# Patient Record
Sex: Female | Born: 1945 | Race: White | Hispanic: No | Marital: Married | Smoking: Never smoker
Health system: Southern US, Community
[De-identification: ages and names within clinical notes are randomized; demographics above are authoritative.]

## PROBLEM LIST (undated history)

## (undated) DIAGNOSIS — K219 Gastro-esophageal reflux disease without esophagitis: Secondary | ICD-10-CM

## (undated) DIAGNOSIS — N189 Chronic kidney disease, unspecified: Secondary | ICD-10-CM

## (undated) DIAGNOSIS — Z87442 Personal history of urinary calculi: Secondary | ICD-10-CM

## (undated) DIAGNOSIS — E079 Disorder of thyroid, unspecified: Secondary | ICD-10-CM

## (undated) DIAGNOSIS — E785 Hyperlipidemia, unspecified: Secondary | ICD-10-CM

## (undated) DIAGNOSIS — E119 Type 2 diabetes mellitus without complications: Secondary | ICD-10-CM

## (undated) DIAGNOSIS — D649 Anemia, unspecified: Secondary | ICD-10-CM

## (undated) DIAGNOSIS — R7303 Prediabetes: Secondary | ICD-10-CM

## (undated) DIAGNOSIS — E039 Hypothyroidism, unspecified: Secondary | ICD-10-CM

## (undated) DIAGNOSIS — J189 Pneumonia, unspecified organism: Secondary | ICD-10-CM

## (undated) DIAGNOSIS — C801 Malignant (primary) neoplasm, unspecified: Secondary | ICD-10-CM

## (undated) DIAGNOSIS — I499 Cardiac arrhythmia, unspecified: Secondary | ICD-10-CM

## (undated) DIAGNOSIS — R0600 Dyspnea, unspecified: Secondary | ICD-10-CM

## (undated) DIAGNOSIS — G43909 Migraine, unspecified, not intractable, without status migrainosus: Secondary | ICD-10-CM

## (undated) DIAGNOSIS — R0989 Other specified symptoms and signs involving the circulatory and respiratory systems: Secondary | ICD-10-CM

## (undated) DIAGNOSIS — D696 Thrombocytopenia, unspecified: Secondary | ICD-10-CM

## (undated) DIAGNOSIS — I10 Essential (primary) hypertension: Secondary | ICD-10-CM

## (undated) DIAGNOSIS — M1990 Unspecified osteoarthritis, unspecified site: Secondary | ICD-10-CM

## (undated) HISTORY — PX: OTHER SURGICAL HISTORY: SHX169

## (undated) HISTORY — DX: Prediabetes: R73.03

## (undated) HISTORY — DX: Other specified symptoms and signs involving the circulatory and respiratory systems: R09.89

## (undated) HISTORY — DX: Disorder of thyroid, unspecified: E07.9

## (undated) HISTORY — DX: Gastro-esophageal reflux disease without esophagitis: K21.9

## (undated) HISTORY — DX: Hyperlipidemia, unspecified: E78.5

## (undated) HISTORY — DX: Type 2 diabetes mellitus without complications: E11.9

## (undated) HISTORY — DX: Essential (primary) hypertension: I10

## (undated) HISTORY — PX: VENTRAL HERNIA REPAIR: SHX424

## (undated) HISTORY — PX: FOOT SURGERY: SHX648

## (undated) HISTORY — PX: TONSILLECTOMY: SUR1361

## (undated) HISTORY — DX: Unspecified osteoarthritis, unspecified site: M19.90

## (undated) HISTORY — PX: HERNIA REPAIR: SHX51

## (undated) HISTORY — PX: UPPER GI ENDOSCOPY: SHX6162

## (undated) HISTORY — DX: Migraine, unspecified, not intractable, without status migrainosus: G43.909

---

## 1970-04-17 HISTORY — PX: PILONIDAL CYST EXCISION: SHX744

## 1983-04-18 HISTORY — PX: HERNIA REPAIR: SHX51

## 1994-04-17 HISTORY — PX: CHOLECYSTECTOMY: SHX55

## 1999-04-21 ENCOUNTER — Encounter: Admission: RE | Admit: 1999-04-21 | Discharge: 1999-04-21 | Payer: Self-pay | Admitting: Family Medicine

## 1999-04-21 ENCOUNTER — Encounter: Payer: Self-pay | Admitting: Family Medicine

## 2000-05-11 ENCOUNTER — Encounter: Payer: Self-pay | Admitting: Family Medicine

## 2000-05-11 ENCOUNTER — Encounter: Admission: RE | Admit: 2000-05-11 | Discharge: 2000-05-11 | Payer: Self-pay | Admitting: Family Medicine

## 2000-05-29 ENCOUNTER — Encounter (INDEPENDENT_AMBULATORY_CARE_PROVIDER_SITE_OTHER): Payer: Self-pay | Admitting: Specialist

## 2000-05-29 ENCOUNTER — Other Ambulatory Visit: Admission: RE | Admit: 2000-05-29 | Discharge: 2000-05-29 | Payer: Self-pay | Admitting: Obstetrics and Gynecology

## 2003-09-08 ENCOUNTER — Ambulatory Visit (HOSPITAL_COMMUNITY): Admission: RE | Admit: 2003-09-08 | Discharge: 2003-09-08 | Payer: Self-pay | Admitting: *Deleted

## 2004-06-16 ENCOUNTER — Ambulatory Visit: Payer: Self-pay | Admitting: Internal Medicine

## 2004-06-22 ENCOUNTER — Ambulatory Visit: Payer: Self-pay

## 2007-04-18 HISTORY — PX: COLONOSCOPY: SHX174

## 2007-12-04 ENCOUNTER — Ambulatory Visit (HOSPITAL_COMMUNITY): Admission: RE | Admit: 2007-12-04 | Discharge: 2007-12-04 | Payer: Self-pay | Admitting: General Surgery

## 2008-06-30 ENCOUNTER — Encounter: Admission: RE | Admit: 2008-06-30 | Discharge: 2008-06-30 | Payer: Self-pay | Admitting: Family Medicine

## 2009-07-27 ENCOUNTER — Emergency Department (HOSPITAL_COMMUNITY): Admission: EM | Admit: 2009-07-27 | Discharge: 2009-07-27 | Payer: Self-pay | Admitting: Family Medicine

## 2010-08-30 NOTE — Op Note (Signed)
NAME:  Jillian Rodgers, Jillian Rodgers             ACCOUNT NO.:  [id number]   MEDICAL RECORD NO.:  [id number]          PATIENT TYPE:  AMB   LOCATION:  SDS                          FACILITY:  [facility]   PHYSICIAN:  Leonie Man, M.D.   DATE OF BIRTH:  03-02-1946   DATE OF PROCEDURE:  12/04/2007  DATE OF DISCHARGE:                               OPERATIVE REPORT   PREOPERATIVE DIAGNOSIS:  Recurrent ventral hernia.   POSTOPERATIVE DIAGNOSIS:  Recurrent ventral hernia.   PROCEDURE:  Repair of recurrent ventral hernia.   SURGEON:  Leonie Man, MD   ASSISTANT:  OR R.N.   ANESTHESIA:  General.   FINDINGS:  Recurrent hernia with incarcerated preperitoneal fat with  underlying bulging mesh repair.   SPECIMENS:  There were no specimens sent to the lab.   ESTIMATED BLOOD LOSS:  Minimal.   COMPLICATIONS:  There were no complications.  The patient returned to  the PACU in excellent condition.   HISTORY AND INDICATIONS:  The patient is a 65 year old lady presenting  with epigastric abdominal pain.  She had undergone an open  cholecystectomy in the remote past and developed a hernia in the medial  aspect of this incision.  This was repaired laparoscopically.  She  subsequently developed a mass in the medial aspect of her  cholecystectomy incision again and this has been causing some pain and  discomfort, and this was diagnosed as a recurrent hernia.  The patient  comes to the operating room now after the details of the procedure, the  risks, and benefits of surgery have been fully discussed.  All questions  have been answered and the consent for operation obtained.   PROCEDURE:  Following the induction of satisfactory general anesthesia,  the abdomen was prepped and draped to be included in a sterile operative  field.  We identified the patient as Jillian Rodgers and the operation  to be done as ventral hernia repair, routine identification precautions  and presurgical precautions were carried  out.   An incision through the old right upper quadrant incision was extended  slightly into the epigastrium and was deepened through the skin and  subcutaneous tissues with the dissection carried down to the region of  the hernia, but was overlying incarcerated preperitoneal fat was  dissected free.  The fat was removed and discarded.  The underlying mesh  bulges contents up into the hernia significantly.  I did not find any  defects around the mesh, I therefore decided not to put a new mesh in,  but to simply close a defect over the already present mesh, including  the mesh into the sutures.  This was accomplished with #1 interrupted  Novofil sutures.  At the end of this, the repair appeared to be fully  intact.  Sponge and instrument counts were verified.  The incision was  irrigated with normal saline and the subcutaneous tissues closed with  interrupted 3-0 Vicryl sutures.  Skin was closed with running  intradermal 4-0 Monocryl suture and then reinforced with Dermabond and  Steri-Strips, and a sterile dressing was applied.  Anesthetic reversed.  The patient was removed from  the operating room to the recovery room in  stable condition.  She tolerated the procedure well.      Leonie Man, M.D.  Electronically Signed     PB/MEDQ  D:  12/04/2007  T:  12/04/2007  Job:  [id number]

## 2010-09-02 NOTE — Op Note (Signed)
NAME:  Jillian Rodgers, Jillian Rodgers              ACCOUNT NO.:  [id number]   MEDICAL RECORD NO.:  [id number]                   PATIENT TYPE:  AMB   LOCATION:  DAY                                  FACILITY:  [facility]   PHYSICIAN:  Vikki Ports, M.D.         DATE OF BIRTH:  1946-03-09   DATE OF PROCEDURE:  09/08/2003  DATE OF DISCHARGE:                                 OPERATIVE REPORT   PREOPERATIVE. DIAGNOSES:  Ventral hernia.   POSTOPERATIVE DIAGNOSES:  Ventral hernia.   PROCEDURE:  Laparoscopic ventral hernia repair with mesh.   SURGEON:  Vikki Ports, M.D.   ASSISTANT:  None.   ANESTHESIA:  General.   DESCRIPTION OF PROCEDURE:  The patient was taken to the operating room,  placed in the supine position and after adequate anesthesia was induced  using an endotracheal tube, the Foley catheter was placed and the abdomen  was prepped and draped in the normal sterile fashion. Using an 11 mm  Optiview through a 1 cm incision in the left abdomen, the peritoneum was  entered. Pneumoperitoneum was obtained. Two separate 5 mm trocars were  placed in the lower abdomen. The hernia defect could be visualized, a few  omental adhesions were taken down from it. The defect only measured 4 cm by  3 cm in circumferential diameter. A piece of 12 cm Tritex dual facing mesh  was then placed in the abdomen after tacking Novofil sutures were placed in  the periphery. These were brought up and tacked to the anterior abdominal  wall with good visualization and coverage of the defect. It was tacked along  the periphery as well. Adequate hemostasis was assured, pneumoperitoneum was  released. The incisions were closed with subcuticular 4-0 Monocryl, 0.5  Marcaine was injected, sterile dressings were applied. The patient tolerated  the procedure well and went to PACU in good condition.                                               Vikki Ports, M.D.    KRH/MEDQ  D:  09/08/2003   T:  09/08/2003  Job:  [id number]   cc:   Quita Skye. Artis Flock, M.D.  [address]  [place]  [place] [address]  Fax: [phone]

## 2010-11-10 LAB — HM MAMMOGRAPHY: HM Mammogram: NORMAL

## 2011-01-13 LAB — CBC
HCT: 38.9
Hemoglobin: 13.3
MCHC: 34.3
MCV: 87.8
Platelets: 252
RBC: 4.43
RDW: 13.2
WBC: 11 — ABNORMAL HIGH

## 2011-01-13 LAB — DIFFERENTIAL
Basophils Absolute: 0
Basophils Relative: 0
Eosinophils Absolute: 0.1
Eosinophils Relative: 1
Lymphocytes Relative: 23
Lymphs Abs: 2.5
Monocytes Absolute: 0.9
Monocytes Relative: 9
Neutro Abs: 7.3
Neutrophils Relative %: 67

## 2011-01-13 LAB — COMPREHENSIVE METABOLIC PANEL
ALT: 13
AST: 18
Albumin: 3.9
Alkaline Phosphatase: 97
BUN: 17
CO2: 25
Calcium: 9.6
Chloride: 102
Creatinine, Ser: 1.12
GFR calc Af Amer: 59 — ABNORMAL LOW
GFR calc non Af Amer: 49 — ABNORMAL LOW
Glucose, Bld: 84
Potassium: 4.1
Sodium: 138
Total Bilirubin: 0.6
Total Protein: 6.9

## 2011-01-13 LAB — PROTIME-INR
INR: 0.9
Prothrombin Time: 12.5

## 2011-06-01 DIAGNOSIS — Z79899 Other long term (current) drug therapy: Secondary | ICD-10-CM | POA: Diagnosis not present

## 2011-06-01 DIAGNOSIS — E559 Vitamin D deficiency, unspecified: Secondary | ICD-10-CM | POA: Diagnosis not present

## 2011-06-01 DIAGNOSIS — R7309 Other abnormal glucose: Secondary | ICD-10-CM | POA: Diagnosis not present

## 2011-06-01 DIAGNOSIS — E782 Mixed hyperlipidemia: Secondary | ICD-10-CM | POA: Diagnosis not present

## 2011-06-01 DIAGNOSIS — I10 Essential (primary) hypertension: Secondary | ICD-10-CM | POA: Diagnosis not present

## 2011-06-20 DIAGNOSIS — G43019 Migraine without aura, intractable, without status migrainosus: Secondary | ICD-10-CM | POA: Diagnosis not present

## 2011-07-13 DIAGNOSIS — Z79899 Other long term (current) drug therapy: Secondary | ICD-10-CM | POA: Diagnosis not present

## 2011-08-30 DIAGNOSIS — R7309 Other abnormal glucose: Secondary | ICD-10-CM | POA: Diagnosis not present

## 2011-08-30 DIAGNOSIS — Z79899 Other long term (current) drug therapy: Secondary | ICD-10-CM | POA: Diagnosis not present

## 2011-08-30 DIAGNOSIS — I10 Essential (primary) hypertension: Secondary | ICD-10-CM | POA: Diagnosis not present

## 2011-08-30 DIAGNOSIS — E559 Vitamin D deficiency, unspecified: Secondary | ICD-10-CM | POA: Diagnosis not present

## 2011-08-30 DIAGNOSIS — E782 Mixed hyperlipidemia: Secondary | ICD-10-CM | POA: Diagnosis not present

## 2011-10-30 DIAGNOSIS — G43019 Migraine without aura, intractable, without status migrainosus: Secondary | ICD-10-CM | POA: Diagnosis not present

## 2011-10-30 DIAGNOSIS — Z79899 Other long term (current) drug therapy: Secondary | ICD-10-CM | POA: Diagnosis not present

## 2011-11-28 DIAGNOSIS — E559 Vitamin D deficiency, unspecified: Secondary | ICD-10-CM | POA: Diagnosis not present

## 2011-11-28 DIAGNOSIS — Z79899 Other long term (current) drug therapy: Secondary | ICD-10-CM | POA: Diagnosis not present

## 2011-11-28 DIAGNOSIS — Z23 Encounter for immunization: Secondary | ICD-10-CM | POA: Diagnosis not present

## 2011-11-28 DIAGNOSIS — I10 Essential (primary) hypertension: Secondary | ICD-10-CM | POA: Diagnosis not present

## 2011-11-28 DIAGNOSIS — R7309 Other abnormal glucose: Secondary | ICD-10-CM | POA: Diagnosis not present

## 2011-11-28 DIAGNOSIS — Z1212 Encounter for screening for malignant neoplasm of rectum: Secondary | ICD-10-CM | POA: Diagnosis not present

## 2011-11-28 DIAGNOSIS — E782 Mixed hyperlipidemia: Secondary | ICD-10-CM | POA: Diagnosis not present

## 2011-12-07 DIAGNOSIS — Z1231 Encounter for screening mammogram for malignant neoplasm of breast: Secondary | ICD-10-CM | POA: Diagnosis not present

## 2011-12-07 DIAGNOSIS — Z124 Encounter for screening for malignant neoplasm of cervix: Secondary | ICD-10-CM | POA: Diagnosis not present

## 2012-01-23 DIAGNOSIS — M949 Disorder of cartilage, unspecified: Secondary | ICD-10-CM | POA: Diagnosis not present

## 2012-01-23 DIAGNOSIS — M899 Disorder of bone, unspecified: Secondary | ICD-10-CM | POA: Diagnosis not present

## 2012-02-02 DIAGNOSIS — Z23 Encounter for immunization: Secondary | ICD-10-CM | POA: Diagnosis not present

## 2012-02-20 DIAGNOSIS — H6120 Impacted cerumen, unspecified ear: Secondary | ICD-10-CM | POA: Diagnosis not present

## 2012-02-20 DIAGNOSIS — J301 Allergic rhinitis due to pollen: Secondary | ICD-10-CM | POA: Diagnosis not present

## 2012-02-21 DIAGNOSIS — G43719 Chronic migraine without aura, intractable, without status migrainosus: Secondary | ICD-10-CM | POA: Diagnosis not present

## 2012-02-21 DIAGNOSIS — G43019 Migraine without aura, intractable, without status migrainosus: Secondary | ICD-10-CM | POA: Diagnosis not present

## 2012-02-21 DIAGNOSIS — G43119 Migraine with aura, intractable, without status migrainosus: Secondary | ICD-10-CM | POA: Diagnosis not present

## 2012-02-29 DIAGNOSIS — E559 Vitamin D deficiency, unspecified: Secondary | ICD-10-CM | POA: Diagnosis not present

## 2012-02-29 DIAGNOSIS — I10 Essential (primary) hypertension: Secondary | ICD-10-CM | POA: Diagnosis not present

## 2012-02-29 DIAGNOSIS — R7309 Other abnormal glucose: Secondary | ICD-10-CM | POA: Diagnosis not present

## 2012-02-29 DIAGNOSIS — Z79899 Other long term (current) drug therapy: Secondary | ICD-10-CM | POA: Diagnosis not present

## 2012-02-29 DIAGNOSIS — E782 Mixed hyperlipidemia: Secondary | ICD-10-CM | POA: Diagnosis not present

## 2012-03-07 DIAGNOSIS — H2510 Age-related nuclear cataract, unspecified eye: Secondary | ICD-10-CM | POA: Diagnosis not present

## 2012-05-31 DIAGNOSIS — R7309 Other abnormal glucose: Secondary | ICD-10-CM | POA: Diagnosis not present

## 2012-05-31 DIAGNOSIS — E559 Vitamin D deficiency, unspecified: Secondary | ICD-10-CM | POA: Diagnosis not present

## 2012-05-31 DIAGNOSIS — I10 Essential (primary) hypertension: Secondary | ICD-10-CM | POA: Diagnosis not present

## 2012-05-31 DIAGNOSIS — E782 Mixed hyperlipidemia: Secondary | ICD-10-CM | POA: Diagnosis not present

## 2012-06-07 DIAGNOSIS — R7309 Other abnormal glucose: Secondary | ICD-10-CM | POA: Diagnosis not present

## 2012-06-07 DIAGNOSIS — I10 Essential (primary) hypertension: Secondary | ICD-10-CM | POA: Diagnosis not present

## 2012-06-07 DIAGNOSIS — E559 Vitamin D deficiency, unspecified: Secondary | ICD-10-CM | POA: Diagnosis not present

## 2012-06-07 DIAGNOSIS — E782 Mixed hyperlipidemia: Secondary | ICD-10-CM | POA: Diagnosis not present

## 2012-06-07 DIAGNOSIS — Z79899 Other long term (current) drug therapy: Secondary | ICD-10-CM | POA: Diagnosis not present

## 2012-07-03 DIAGNOSIS — J019 Acute sinusitis, unspecified: Secondary | ICD-10-CM | POA: Diagnosis not present

## 2012-07-03 DIAGNOSIS — G43719 Chronic migraine without aura, intractable, without status migrainosus: Secondary | ICD-10-CM | POA: Diagnosis not present

## 2012-07-03 DIAGNOSIS — G43119 Migraine with aura, intractable, without status migrainosus: Secondary | ICD-10-CM | POA: Diagnosis not present

## 2012-07-03 DIAGNOSIS — G43019 Migraine without aura, intractable, without status migrainosus: Secondary | ICD-10-CM | POA: Diagnosis not present

## 2012-08-01 DIAGNOSIS — L089 Local infection of the skin and subcutaneous tissue, unspecified: Secondary | ICD-10-CM | POA: Diagnosis not present

## 2012-08-01 DIAGNOSIS — S60469A Insect bite (nonvenomous) of unspecified finger, initial encounter: Secondary | ICD-10-CM | POA: Diagnosis not present

## 2012-08-01 DIAGNOSIS — W57XXXA Bitten or stung by nonvenomous insect and other nonvenomous arthropods, initial encounter: Secondary | ICD-10-CM | POA: Diagnosis not present

## 2012-10-08 DIAGNOSIS — S40269A Insect bite (nonvenomous) of unspecified shoulder, initial encounter: Secondary | ICD-10-CM | POA: Diagnosis not present

## 2012-10-08 DIAGNOSIS — W57XXXA Bitten or stung by nonvenomous insect and other nonvenomous arthropods, initial encounter: Secondary | ICD-10-CM | POA: Diagnosis not present

## 2012-11-05 DIAGNOSIS — G43019 Migraine without aura, intractable, without status migrainosus: Secondary | ICD-10-CM | POA: Diagnosis not present

## 2012-11-05 DIAGNOSIS — G43719 Chronic migraine without aura, intractable, without status migrainosus: Secondary | ICD-10-CM | POA: Diagnosis not present

## 2012-11-05 DIAGNOSIS — G43119 Migraine with aura, intractable, without status migrainosus: Secondary | ICD-10-CM | POA: Diagnosis not present

## 2012-12-03 DIAGNOSIS — Z1212 Encounter for screening for malignant neoplasm of rectum: Secondary | ICD-10-CM | POA: Diagnosis not present

## 2012-12-03 DIAGNOSIS — E559 Vitamin D deficiency, unspecified: Secondary | ICD-10-CM | POA: Diagnosis not present

## 2012-12-03 DIAGNOSIS — I10 Essential (primary) hypertension: Secondary | ICD-10-CM | POA: Diagnosis not present

## 2012-12-03 DIAGNOSIS — E782 Mixed hyperlipidemia: Secondary | ICD-10-CM | POA: Diagnosis not present

## 2012-12-03 DIAGNOSIS — R7309 Other abnormal glucose: Secondary | ICD-10-CM | POA: Diagnosis not present

## 2012-12-03 DIAGNOSIS — Z79899 Other long term (current) drug therapy: Secondary | ICD-10-CM | POA: Diagnosis not present

## 2012-12-19 DIAGNOSIS — J0410 Acute tracheitis without obstruction: Secondary | ICD-10-CM | POA: Diagnosis not present

## 2012-12-19 DIAGNOSIS — Z7901 Long term (current) use of anticoagulants: Secondary | ICD-10-CM | POA: Diagnosis not present

## 2012-12-19 DIAGNOSIS — R5381 Other malaise: Secondary | ICD-10-CM | POA: Diagnosis not present

## 2012-12-19 DIAGNOSIS — Z79899 Other long term (current) drug therapy: Secondary | ICD-10-CM | POA: Diagnosis not present

## 2013-01-09 DIAGNOSIS — Z1212 Encounter for screening for malignant neoplasm of rectum: Secondary | ICD-10-CM | POA: Diagnosis not present

## 2013-01-09 DIAGNOSIS — Z1289 Encounter for screening for malignant neoplasm of other sites: Secondary | ICD-10-CM | POA: Diagnosis not present

## 2013-01-15 DIAGNOSIS — Z1231 Encounter for screening mammogram for malignant neoplasm of breast: Secondary | ICD-10-CM | POA: Diagnosis not present

## 2013-01-22 ENCOUNTER — Other Ambulatory Visit (HOSPITAL_COMMUNITY): Payer: Self-pay | Admitting: Physician Assistant

## 2013-01-22 ENCOUNTER — Ambulatory Visit (HOSPITAL_COMMUNITY)
Admission: RE | Admit: 2013-01-22 | Discharge: 2013-01-22 | Disposition: A | Discharge status: Home / Self Care | Payer: Medicare Other | Source: Ambulatory Visit | Attending: Physician Assistant | Admitting: Physician Assistant

## 2013-01-22 DIAGNOSIS — M545 Low back pain, unspecified: Secondary | ICD-10-CM | POA: Diagnosis not present

## 2013-01-22 DIAGNOSIS — E782 Mixed hyperlipidemia: Secondary | ICD-10-CM | POA: Diagnosis not present

## 2013-01-22 DIAGNOSIS — M899 Disorder of bone, unspecified: Secondary | ICD-10-CM | POA: Diagnosis not present

## 2013-01-22 DIAGNOSIS — R7309 Other abnormal glucose: Secondary | ICD-10-CM | POA: Diagnosis not present

## 2013-01-22 DIAGNOSIS — M129 Arthropathy, unspecified: Secondary | ICD-10-CM | POA: Diagnosis not present

## 2013-01-22 DIAGNOSIS — M79609 Pain in unspecified limb: Secondary | ICD-10-CM | POA: Insufficient documentation

## 2013-01-22 DIAGNOSIS — M5137 Other intervertebral disc degeneration, lumbosacral region: Secondary | ICD-10-CM | POA: Diagnosis not present

## 2013-01-22 DIAGNOSIS — E559 Vitamin D deficiency, unspecified: Secondary | ICD-10-CM | POA: Diagnosis not present

## 2013-01-22 DIAGNOSIS — Z23 Encounter for immunization: Secondary | ICD-10-CM | POA: Diagnosis not present

## 2013-01-22 DIAGNOSIS — I10 Essential (primary) hypertension: Secondary | ICD-10-CM | POA: Diagnosis not present

## 2013-01-23 ENCOUNTER — Other Ambulatory Visit: Payer: Self-pay | Admitting: Obstetrics and Gynecology

## 2013-01-23 DIAGNOSIS — R928 Other abnormal and inconclusive findings on diagnostic imaging of breast: Secondary | ICD-10-CM

## 2013-01-29 ENCOUNTER — Ambulatory Visit
Admission: RE | Admit: 2013-01-29 | Discharge: 2013-01-29 | Disposition: A | Discharge status: Home / Self Care | Payer: Medicare Other | Source: Ambulatory Visit | Attending: Obstetrics and Gynecology | Admitting: Obstetrics and Gynecology

## 2013-01-29 ENCOUNTER — Other Ambulatory Visit: Payer: Self-pay | Admitting: Obstetrics and Gynecology

## 2013-01-29 DIAGNOSIS — IMO0002 Reserved for concepts with insufficient information to code with codable children: Secondary | ICD-10-CM | POA: Diagnosis not present

## 2013-01-29 DIAGNOSIS — R928 Other abnormal and inconclusive findings on diagnostic imaging of breast: Secondary | ICD-10-CM

## 2013-01-29 DIAGNOSIS — N6320 Unspecified lump in the left breast, unspecified quadrant: Secondary | ICD-10-CM

## 2013-01-31 ENCOUNTER — Encounter: Payer: Self-pay | Admitting: Internal Medicine

## 2013-01-31 DIAGNOSIS — E039 Hypothyroidism, unspecified: Secondary | ICD-10-CM | POA: Insufficient documentation

## 2013-01-31 DIAGNOSIS — E785 Hyperlipidemia, unspecified: Secondary | ICD-10-CM | POA: Insufficient documentation

## 2013-01-31 DIAGNOSIS — I10 Essential (primary) hypertension: Secondary | ICD-10-CM

## 2013-01-31 DIAGNOSIS — R0989 Other specified symptoms and signs involving the circulatory and respiratory systems: Secondary | ICD-10-CM | POA: Insufficient documentation

## 2013-01-31 DIAGNOSIS — K219 Gastro-esophageal reflux disease without esophagitis: Secondary | ICD-10-CM | POA: Insufficient documentation

## 2013-01-31 DIAGNOSIS — M1990 Unspecified osteoarthritis, unspecified site: Secondary | ICD-10-CM | POA: Insufficient documentation

## 2013-02-11 DIAGNOSIS — M76899 Other specified enthesopathies of unspecified lower limb, excluding foot: Secondary | ICD-10-CM | POA: Diagnosis not present

## 2013-02-12 DIAGNOSIS — E559 Vitamin D deficiency, unspecified: Secondary | ICD-10-CM | POA: Diagnosis not present

## 2013-02-12 DIAGNOSIS — M545 Low back pain, unspecified: Secondary | ICD-10-CM | POA: Diagnosis not present

## 2013-02-12 DIAGNOSIS — I10 Essential (primary) hypertension: Secondary | ICD-10-CM | POA: Diagnosis not present

## 2013-02-12 DIAGNOSIS — E782 Mixed hyperlipidemia: Secondary | ICD-10-CM | POA: Diagnosis not present

## 2013-02-12 DIAGNOSIS — Z23 Encounter for immunization: Secondary | ICD-10-CM | POA: Diagnosis not present

## 2013-02-12 DIAGNOSIS — R7309 Other abnormal glucose: Secondary | ICD-10-CM | POA: Diagnosis not present

## 2013-02-18 ENCOUNTER — Encounter: Payer: Self-pay | Admitting: Physician Assistant

## 2013-02-18 ENCOUNTER — Ambulatory Visit: Payer: Medicare Other | Admitting: Physician Assistant

## 2013-02-18 VITALS — BP 120/68 | HR 72 | Temp 97.5°F | Resp 16 | Wt 155.0 lb

## 2013-02-18 DIAGNOSIS — M545 Low back pain, unspecified: Secondary | ICD-10-CM | POA: Diagnosis not present

## 2013-02-18 DIAGNOSIS — E785 Hyperlipidemia, unspecified: Secondary | ICD-10-CM | POA: Diagnosis not present

## 2013-02-18 DIAGNOSIS — R7303 Prediabetes: Secondary | ICD-10-CM

## 2013-02-18 DIAGNOSIS — R197 Diarrhea, unspecified: Secondary | ICD-10-CM

## 2013-02-18 DIAGNOSIS — I10 Essential (primary) hypertension: Secondary | ICD-10-CM

## 2013-02-18 DIAGNOSIS — E782 Mixed hyperlipidemia: Secondary | ICD-10-CM | POA: Diagnosis not present

## 2013-02-18 DIAGNOSIS — E559 Vitamin D deficiency, unspecified: Secondary | ICD-10-CM | POA: Diagnosis not present

## 2013-02-18 DIAGNOSIS — R7309 Other abnormal glucose: Secondary | ICD-10-CM | POA: Diagnosis not present

## 2013-02-18 DIAGNOSIS — R110 Nausea: Secondary | ICD-10-CM

## 2013-02-18 DIAGNOSIS — E612 Magnesium deficiency: Secondary | ICD-10-CM

## 2013-02-18 DIAGNOSIS — Z23 Encounter for immunization: Secondary | ICD-10-CM | POA: Diagnosis not present

## 2013-02-18 LAB — CBC WITH DIFFERENTIAL/PLATELET
Basophils Absolute: 0 10*3/uL (ref 0.0–0.1)
Basophils Relative: 0 % (ref 0–1)
Eosinophils Absolute: 0.2 10*3/uL (ref 0.0–0.7)
Eosinophils Relative: 2 % (ref 0–5)
HCT: 36.9 % (ref 36.0–46.0)
Hemoglobin: 12.6 g/dL (ref 12.0–15.0)
Lymphocytes Relative: 28 % (ref 12–46)
Lymphs Abs: 2.1 10*3/uL (ref 0.7–4.0)
MCH: 30.4 pg (ref 26.0–34.0)
MCHC: 34.1 g/dL (ref 30.0–36.0)
MCV: 88.9 fL (ref 78.0–100.0)
Monocytes Absolute: 0.6 10*3/uL (ref 0.1–1.0)
Monocytes Relative: 8 % (ref 3–12)
Neutro Abs: 4.7 10*3/uL (ref 1.7–7.7)
Neutrophils Relative %: 62 % (ref 43–77)
Platelets: 128 10*3/uL — ABNORMAL LOW (ref 150–400)
RBC: 4.15 MIL/uL (ref 3.87–5.11)
RDW: 14.5 % (ref 11.5–15.5)
WBC: 7.6 10*3/uL (ref 4.0–10.5)

## 2013-02-18 LAB — BASIC METABOLIC PANEL WITH GFR
BUN: 28 mg/dL — ABNORMAL HIGH (ref 6–23)
CO2: 29 mEq/L (ref 19–32)
Calcium: 9.9 mg/dL (ref 8.4–10.5)
Chloride: 104 mEq/L (ref 96–112)
Creat: 0.78 mg/dL (ref 0.50–1.10)
GFR, Est African American: >89 mL/min
GFR, Est Non African American: 78 mL/min
Glucose, Bld: 82 mg/dL (ref 70–99)
Potassium: 4.1 mEq/L (ref 3.5–5.3)
Sodium: 141 mEq/L (ref 135–145)

## 2013-02-18 LAB — MAGNESIUM: Magnesium: 2.1 mg/dL (ref 1.5–2.5)

## 2013-02-18 LAB — HEPATIC FUNCTION PANEL
ALT: 10 U/L (ref 0–35)
AST: 16 U/L (ref 0–37)
Albumin: 4.2 g/dL (ref 3.5–5.2)
Alkaline Phosphatase: 69 U/L (ref 39–117)
Bilirubin, Direct: 0.2 mg/dL (ref 0.0–0.3)
Indirect Bilirubin: 0.5 mg/dL (ref 0.0–0.9)
Total Bilirubin: 0.7 mg/dL (ref 0.3–1.2)
Total Protein: 6.5 g/dL (ref 6.0–8.3)

## 2013-02-18 LAB — HEMOGLOBIN A1C
Hgb A1c MFr Bld: 5.5 % (ref <5.7)
Mean Plasma Glucose: 111 mg/dL (ref <117)

## 2013-02-18 LAB — LIPID PANEL
Cholesterol: 154 mg/dL (ref 0–200)
HDL: 70 mg/dL (ref >39)
LDL Cholesterol: 64 mg/dL (ref 0–99)
Total CHOL/HDL Ratio: 2.2 Ratio
Triglycerides: 100 mg/dL (ref <150)
VLDL: 20 mg/dL (ref 0–40)

## 2013-02-18 LAB — TSH: TSH: 1.509 u[IU]/mL (ref 0.350–4.500)

## 2013-02-18 MED ORDER — PROMETHAZINE HCL 12.5 MG PO TABS: 12.5000 mg | ORAL_TABLET | Freq: Four times a day (QID) | ORAL | Status: DC | PRN

## 2013-02-18 MED ORDER — AZITHROMYCIN 250 MG PO TABS: ORAL_TABLET | ORAL | Status: AC

## 2013-02-18 MED ORDER — CIPROFLOXACIN HCL 500 MG PO TABS: 500.0000 mg | ORAL_TABLET | Freq: Two times a day (BID) | ORAL | Status: AC

## 2013-02-18 NOTE — Progress Notes (Signed)
Patient ID: Jillian Rodgers, female   DOB: 02/07/1946, 67 y.o.   MRN: [id number] HPI Patient presents for 3 month follow up with hypertension, hyperlipidemia, prediabetes and vitamin D. Patient's blood pressure has been controlled at home. Patient denies chest pain, shortness of breath, dizziness. Patient's cholesterol is diet controlled and is on pravastatin medicine and denies myalgias. Her cholesterol last visit was  61 LDL and at goal . she has  been working on diet and exercise for prediabetes, denies changes in vision, polys, and paresthesias. Last A1C in office was at goal of 5.6%  Patient has right hip pain and seeing ortho and physical therapy for possible trochanteric bursitis and is now on meloxicam. It is improving some.  Patient is traveling to [place] at end of December and would like medications for nausea and diarrhea.  .  Current outpatient prescriptions:Cetirizine HCl (ZYRTEC ALLERGY) 10 MG CAPS, Take 10 mg by mouth daily., Disp: , Rfl: ;  erythromycin with ethanol (THERAMYCIN) 2 % external solution, Apply twice daily to face as needed, Disp: , Rfl: ;  gabapentin (NEURONTIN) 600 MG tablet, Take 600 mg by mouth 3 (three) times daily., Disp: , Rfl: ;  hydrochlorothiazide (HYDRODIURIL) 25 MG tablet, Take 25 mg by mouth daily., Disp: , Rfl:  levothyroxine (SYNTHROID, LEVOTHROID) 50 MCG tablet, Take 50 mcg by mouth daily before breakfast., Disp: , Rfl: ;  pantoprazole (PROTONIX) 40 MG tablet, Take 40 mg by mouth daily., Disp: , Rfl: ;  pravastatin (PRAVACHOL) 40 MG tablet, Take 40 mg by mouth daily., Disp: , Rfl: ;  VITAMIN D, ERGOCALCIFEROL, PO, Take 5,000 Int'l Units/day by mouth daily., Disp: , Rfl:  No Known Allergies  ROS Constitutional: Denies fever, chills, weight loss/gain, headaches, insomnia, fatigue, night sweats, and change in appetite. Eyes: Denies redness, blurred vision, diplopia, discharge, itchy, watery eyes.  ENT: Denies discharge, congestion, post nasal  drip, epistaxis, sore throat, earache, hearing loss, dental pain, Tinnitus, Vertigo, Sinus pain, snoring.  Cardio: Denies chest pain, palpitations, irregular heartbeat, syncope, dyspnea, diaphoresis, orthopnea, PND, claudication, edema Respiratory: denies cough, dyspnea, DOE, pleurisy, hoarseness, laryngitis, wheezing.  Gastrointestinal: Denies dysphagia, heartburn, reflux, water brash, pain, cramps, nausea, vomiting, bloating, diarrhea, constipation, hematemesis, melena, hematochezia, jaundice, hemorrhoids Genitourinary: Denies dysuria, frequency, urgency, nocturia, hesitancy, discharge, hematuria, flank pain Musculoskeletal: Denies arthralgia, myalgia, stiffness, Jt. Swelling, pain, limp, and strain/sprain. Skin: Denies puritis, rash, hives, warts, acne, eczema, changing in skin lesion Neuro: Weakness, tremor, incoordination, spasms, paresthesia, pain Psychiatric: Denies confusion, memory loss, sensory loss Endocrine: Denies change in weight, skin, hair change, nocturia, and paresthesia, Diabetic Polys, visual blurring, hyper /hypo glycemic episodes.  Heme/Lymph: Excessive bleeding, bruising, enlarged lymph nodes Past Medical History  Diagnosis Date  . Hypertension   . Pre-diabetes   . Degenerative joint disease   . GERD (gastroesophageal reflux disease)   . Hyperlipidemia   . Thyroid disease    Family history- Review and unchanged Social history- Review and unchanged Filed Vitals:   02/18/13 1136  BP: 120/68  Pulse: 72  Temp: 97.5 F (36.4 C)  Resp: 16   Physical Exam: General Appearance: Well nourished, in no apparent distress. Eyes: PERRLA, EOMs, conjunctiva no swelling or erythema, normal fundi and vessels. Sinuses: No Frontal/maxillary tenderness ENT/Mouth: Ext aud canals clear, normal light reflex with TMs without erythema, bulging. Nares clear with no erythema, swelling, mucus on turbinates. No ulcers, cracking, on lips. Good dentition. No erythema, swelling, or exudate on  post pharynx. Tongue normal, non-obstructing. Tonsils not swollen or  erythematous. Hearing normal.  Neck: Supple, thyroid normal. No bruits or JVD. Respiratory: Respiratory effort normal, BS equal bilaterally without rales, rhonci, wheezing or stridor.  Cardio: Heart sounds normal, regular rate and rhythm without murmurs, rubs or gallops. Peripheral pulses brisk and equal bilaterally, without edema. No aortic or femoral bruits. Breasts: Symmetric, without lumps, nipple discharge, retractions, or fibrocystic changes.  Abdomen: Flat, soft, with bowl sounds. Nontender, no guarding, rebound, masses, or organomegaly. Retractable umblical hernia appreciated.  Lymphatics: Non tender without lymphadenopathy.  Musculoskeletal: Full ROM all peripheral extremities, joint stability, 5/5 strength, and normal gait. Skin: Warm, dry without rashes, lesions, ecchymosis.  Neuro: Cranial nerves intact, reflexes equal bilaterally. Normal muscle tone, no cerebellar symptoms. Sensation intact.  Pysch: Awake and oriented X 3, normal affect, Insight and Judgment appropriate.   Assessement and Plan: Hypertension: Continue medication, monitor blood pressure at home. Continue DASH diet. Cholesterol: Continue diet and exercise. Check cholesterol.  Pre-diabetes-Continue diet and exercise. Check A1C. Traveling- Cipro 500mg  number 28,, phenergan 12.5 number 30, zpak no refills. Umbliical hernia- monitor, may have to send for correction.

## 2013-02-19 ENCOUNTER — Telehealth: Payer: Self-pay

## 2013-02-19 DIAGNOSIS — M25559 Pain in unspecified hip: Secondary | ICD-10-CM | POA: Diagnosis not present

## 2013-02-19 DIAGNOSIS — M76899 Other specified enthesopathies of unspecified lower limb, excluding foot: Secondary | ICD-10-CM | POA: Diagnosis not present

## 2013-02-19 NOTE — Telephone Encounter (Signed)
Patient aware of lab results and instruction 

## 2013-02-19 NOTE — Telephone Encounter (Signed)
Message copied by Linwood Dibbles on Wed Feb 19, 2013 10:50 AM ------      Message from: Quentin Mulling R      Created: Wed Feb 19, 2013  8:37 AM       Cholesterol with LDL at goal less than 70. LFTs, Mag, TSH, A1C are all normal. CBC is normal except it shows thrombocytopenia which we will continue to monitor. The BMP shows that you are dehydrated so please increase water. ------

## 2013-02-19 NOTE — Telephone Encounter (Signed)
Message copied by Linwood Dibbles on Wed Feb 19, 2013 10:56 AM ------      Message from: Quentin Mulling R      Created: Wed Feb 19, 2013  8:37 AM       Cholesterol with LDL at goal less than 70. LFTs, Mag, TSH, A1C are all normal. CBC is normal except it shows thrombocytopenia which we will continue to monitor. The BMP shows that you are dehydrated so please increase water. ------

## 2013-02-25 ENCOUNTER — Other Ambulatory Visit: Payer: Self-pay

## 2013-02-25 DIAGNOSIS — E039 Hypothyroidism, unspecified: Secondary | ICD-10-CM

## 2013-02-25 MED ORDER — LEVOTHYROXINE SODIUM 50 MCG PO TABS: 50.0000 ug | ORAL_TABLET | Freq: Every day | ORAL | Status: DC

## 2013-02-27 DIAGNOSIS — M25559 Pain in unspecified hip: Secondary | ICD-10-CM | POA: Diagnosis not present

## 2013-02-27 DIAGNOSIS — M76899 Other specified enthesopathies of unspecified lower limb, excluding foot: Secondary | ICD-10-CM | POA: Diagnosis not present

## 2013-03-04 DIAGNOSIS — M76899 Other specified enthesopathies of unspecified lower limb, excluding foot: Secondary | ICD-10-CM | POA: Diagnosis not present

## 2013-03-05 ENCOUNTER — Ambulatory Visit
Admission: RE | Admit: 2013-03-05 | Discharge: 2013-03-05 | Disposition: A | Discharge status: Home / Self Care | Payer: Medicare Other | Source: Ambulatory Visit | Attending: Obstetrics and Gynecology | Admitting: Obstetrics and Gynecology

## 2013-03-05 DIAGNOSIS — M25559 Pain in unspecified hip: Secondary | ICD-10-CM | POA: Diagnosis not present

## 2013-03-05 DIAGNOSIS — N6019 Diffuse cystic mastopathy of unspecified breast: Secondary | ICD-10-CM | POA: Diagnosis not present

## 2013-03-05 DIAGNOSIS — M76899 Other specified enthesopathies of unspecified lower limb, excluding foot: Secondary | ICD-10-CM | POA: Diagnosis not present

## 2013-03-05 DIAGNOSIS — N6320 Unspecified lump in the left breast, unspecified quadrant: Secondary | ICD-10-CM

## 2013-03-07 DIAGNOSIS — M25559 Pain in unspecified hip: Secondary | ICD-10-CM | POA: Diagnosis not present

## 2013-03-12 DIAGNOSIS — M25559 Pain in unspecified hip: Secondary | ICD-10-CM | POA: Diagnosis not present

## 2013-03-12 DIAGNOSIS — M76899 Other specified enthesopathies of unspecified lower limb, excluding foot: Secondary | ICD-10-CM | POA: Diagnosis not present

## 2013-03-17 DIAGNOSIS — H2510 Age-related nuclear cataract, unspecified eye: Secondary | ICD-10-CM | POA: Diagnosis not present

## 2013-03-17 DIAGNOSIS — H40019 Open angle with borderline findings, low risk, unspecified eye: Secondary | ICD-10-CM | POA: Diagnosis not present

## 2013-03-19 DIAGNOSIS — M76899 Other specified enthesopathies of unspecified lower limb, excluding foot: Secondary | ICD-10-CM | POA: Diagnosis not present

## 2013-03-19 DIAGNOSIS — M25559 Pain in unspecified hip: Secondary | ICD-10-CM | POA: Diagnosis not present

## 2013-03-25 DIAGNOSIS — M76899 Other specified enthesopathies of unspecified lower limb, excluding foot: Secondary | ICD-10-CM | POA: Diagnosis not present

## 2013-03-26 DIAGNOSIS — M25559 Pain in unspecified hip: Secondary | ICD-10-CM | POA: Diagnosis not present

## 2013-03-26 DIAGNOSIS — M76899 Other specified enthesopathies of unspecified lower limb, excluding foot: Secondary | ICD-10-CM | POA: Diagnosis not present

## 2013-03-28 DIAGNOSIS — M25559 Pain in unspecified hip: Secondary | ICD-10-CM | POA: Diagnosis not present

## 2013-03-28 DIAGNOSIS — M76899 Other specified enthesopathies of unspecified lower limb, excluding foot: Secondary | ICD-10-CM | POA: Diagnosis not present

## 2013-04-09 DIAGNOSIS — M76899 Other specified enthesopathies of unspecified lower limb, excluding foot: Secondary | ICD-10-CM | POA: Diagnosis not present

## 2013-04-09 DIAGNOSIS — M25559 Pain in unspecified hip: Secondary | ICD-10-CM | POA: Diagnosis not present

## 2013-04-16 DIAGNOSIS — M76899 Other specified enthesopathies of unspecified lower limb, excluding foot: Secondary | ICD-10-CM | POA: Diagnosis not present

## 2013-04-16 DIAGNOSIS — M25559 Pain in unspecified hip: Secondary | ICD-10-CM | POA: Diagnosis not present

## 2013-05-11 ENCOUNTER — Other Ambulatory Visit: Payer: Self-pay | Admitting: Physician Assistant

## 2013-05-21 ENCOUNTER — Ambulatory Visit (INDEPENDENT_AMBULATORY_CARE_PROVIDER_SITE_OTHER): Payer: BC Managed Care – PPO | Admitting: Internal Medicine

## 2013-05-21 ENCOUNTER — Encounter: Payer: Self-pay | Admitting: Internal Medicine

## 2013-05-21 VITALS — BP 132/90 | HR 64 | Temp 97.7°F | Resp 16 | Wt 153.8 lb

## 2013-05-21 DIAGNOSIS — I10 Essential (primary) hypertension: Secondary | ICD-10-CM | POA: Diagnosis not present

## 2013-05-21 DIAGNOSIS — Z79899 Other long term (current) drug therapy: Secondary | ICD-10-CM

## 2013-05-21 DIAGNOSIS — E559 Vitamin D deficiency, unspecified: Secondary | ICD-10-CM | POA: Diagnosis not present

## 2013-05-21 DIAGNOSIS — E785 Hyperlipidemia, unspecified: Secondary | ICD-10-CM | POA: Diagnosis not present

## 2013-05-21 DIAGNOSIS — G43019 Migraine without aura, intractable, without status migrainosus: Secondary | ICD-10-CM | POA: Diagnosis not present

## 2013-05-21 DIAGNOSIS — R7309 Other abnormal glucose: Secondary | ICD-10-CM | POA: Diagnosis not present

## 2013-05-21 DIAGNOSIS — G43119 Migraine with aura, intractable, without status migrainosus: Secondary | ICD-10-CM | POA: Diagnosis not present

## 2013-05-21 DIAGNOSIS — R51 Headache: Secondary | ICD-10-CM

## 2013-05-21 DIAGNOSIS — G43719 Chronic migraine without aura, intractable, without status migrainosus: Secondary | ICD-10-CM | POA: Diagnosis not present

## 2013-05-21 DIAGNOSIS — G43819 Other migraine, intractable, without status migrainosus: Secondary | ICD-10-CM | POA: Diagnosis not present

## 2013-05-21 LAB — CBC WITH DIFFERENTIAL/PLATELET
Basophils Absolute: 0 10*3/uL (ref 0.0–0.1)
Basophils Relative: 1 % (ref 0–1)
Eosinophils Absolute: 0.3 10*3/uL (ref 0.0–0.7)
Eosinophils Relative: 4 % (ref 0–5)
HCT: 39.9 % (ref 36.0–46.0)
Hemoglobin: 13.6 g/dL (ref 12.0–15.0)
Lymphocytes Relative: 32 % (ref 12–46)
Lymphs Abs: 2.5 10*3/uL (ref 0.7–4.0)
MCH: 29.5 pg (ref 26.0–34.0)
MCHC: 34.1 g/dL (ref 30.0–36.0)
MCV: 86.6 fL (ref 78.0–100.0)
Monocytes Absolute: 0.8 10*3/uL (ref 0.1–1.0)
Monocytes Relative: 11 % (ref 3–12)
Neutro Abs: 4.1 10*3/uL (ref 1.7–7.7)
Neutrophils Relative %: 52 % (ref 43–77)
Platelets: 135 10*3/uL — ABNORMAL LOW (ref 150–400)
RBC: 4.61 MIL/uL (ref 3.87–5.11)
RDW: 13 % (ref 11.5–15.5)
WBC: 7.9 10*3/uL (ref 4.0–10.5)

## 2013-05-21 LAB — HEPATIC FUNCTION PANEL
ALT: 11 U/L (ref 0–35)
AST: 15 U/L (ref 0–37)
Albumin: 4.5 g/dL (ref 3.5–5.2)
Alkaline Phosphatase: 74 U/L (ref 39–117)
Bilirubin, Direct: 0.2 mg/dL (ref 0.0–0.3)
Indirect Bilirubin: 0.4 mg/dL (ref 0.2–1.2)
Total Bilirubin: 0.6 mg/dL (ref 0.2–1.2)
Total Protein: 6.8 g/dL (ref 6.0–8.3)

## 2013-05-21 LAB — BASIC METABOLIC PANEL WITH GFR
BUN: 25 mg/dL — ABNORMAL HIGH (ref 6–23)
CO2: 28 mEq/L (ref 19–32)
Calcium: 10.6 mg/dL — ABNORMAL HIGH (ref 8.4–10.5)
Chloride: 101 mEq/L (ref 96–112)
Creat: 0.97 mg/dL (ref 0.50–1.10)
GFR, Est African American: 69 mL/min
GFR, Est Non African American: 60 mL/min
Glucose, Bld: 81 mg/dL (ref 70–99)
Potassium: 3.8 mEq/L (ref 3.5–5.3)
Sodium: 141 mEq/L (ref 135–145)

## 2013-05-21 LAB — LIPID PANEL
Cholesterol: 160 mg/dL (ref 0–200)
HDL: 70 mg/dL (ref >39)
LDL Cholesterol: 71 mg/dL (ref 0–99)
Total CHOL/HDL Ratio: 2.3 Ratio
Triglycerides: 97 mg/dL (ref <150)
VLDL: 19 mg/dL (ref 0–40)

## 2013-05-21 LAB — HEMOGLOBIN A1C
Hgb A1c MFr Bld: 5.8 % — ABNORMAL HIGH (ref <5.7)
Mean Plasma Glucose: 120 mg/dL — ABNORMAL HIGH (ref <117)

## 2013-05-21 LAB — TSH: TSH: 1.677 u[IU]/mL (ref 0.350–4.500)

## 2013-05-21 LAB — MAGNESIUM: Magnesium: 1.8 mg/dL (ref 1.5–2.5)

## 2013-05-21 MED ORDER — RANITIDINE HCL 300 MG PO TABS: 300.0000 mg | ORAL_TABLET | Freq: Every day | ORAL | Status: DC

## 2013-05-21 MED ORDER — ERYTHROMYCIN 2 % EX SOLN: Freq: Two times a day (BID) | CUTANEOUS | Status: DC

## 2013-05-21 MED ORDER — MELOXICAM 15 MG PO TABS
15.0000 mg | ORAL_TABLET | Freq: Every day | ORAL | Status: DC
Start: 2013-05-21 — End: 2014-05-27

## 2013-05-21 NOTE — Progress Notes (Signed)
Patient ID: Jillian Rodgers, female   DOB: [birth year], 68 y.o.   MRN: [id number]   This very nice 68 y.o. MWF presents for 3 month follow up with Hypertension, Hyperlipidemia, GERD,  Hypothyroidism,  Pre-Diabetes and Vitamin D Deficiency. She reports having more nocturnal reflux Sx's w/o recognized change in diet of stress levels.   HTN predates since   . BP has been controlled at home. Today's BP: 132/90 mmHg . Patient denies any cardiac type chest pain, palpitations, dyspnea/orthopnea/PND, dizziness, claudication, or dependent edema.   Hyperlipidemia is controlled with diet & meds. Last Cholesterol was 118, Triglycerides were  96, HDL 38 and LDL 61 in Aug 2014 - all at goal. Patient denies myalgias or other med SE's.    Also, the patient has history of PreDiabetes wityh A1c 6.0% in 2012 and last A1c was 5.6% in Aug 2014. Patient denies any symptoms of reactive hypoglycemia, diabetic polys, paresthesias or visual blurring.   Further, Patient has history of Vitamin D Deficiency of 36 in 2012 with last vitamin D of 98 in Aug 2014. Patient supplements vitamin D without any suspected side-effects.    Medication List       erythromycin with ethanol 2 % external solution  Commonly known as:  THERAMYCIN  Apply twice daily to face as needed     gabapentin 600 MG tablet  Commonly known as:  NEURONTIN  Take 600 mg by mouth 3 (three) times daily.     hydrochlorothiazide 25 MG tablet  Commonly known as:  HYDRODIURIL  Take 25 mg by mouth daily.     levothyroxine 50 MCG tablet  Commonly known as:  SYNTHROID, LEVOTHROID  Take 1 tablet (50 mcg total) by mouth daily before breakfast.     meloxicam 15 MG tablet  Commonly known as:  MOBIC  Take 15 mg by mouth daily.     pantoprazole 40 MG tablet  Commonly known as:  PROTONIX  take 1 tablet by mouth once daily     pravastatin 40 MG tablet  Commonly known as:  PRAVACHOL  Take 40 mg by mouth daily.     VITAMIN D (ERGOCALCIFEROL) PO   Take 5,000 Int'l Units/day by mouth daily.     ZYRTEC ALLERGY 10 MG Caps  Generic drug:  Cetirizine HCl  Take 10 mg by mouth daily.         No Known Allergies  PMHx:   Past Medical History  Diagnosis Date  . Hypertension   . Pre-diabetes   . Degenerative joint disease   . GERD (gastroesophageal reflux disease)   . Hyperlipidemia   . Thyroid disease   . Migraines     neurontin helps    FHx:    Reviewed / unchanged  SHx:    Reviewed / unchanged  Systems Review: Constitutional: Denies fever, chills, wt changes, headaches, insomnia, fatigue, night sweats, change in appetite. Eyes: Denies redness, blurred vision, diplopia, discharge, itchy, watery eyes.  ENT: Denies discharge, congestion, post nasal drip, epistaxis, sore throat, earache, hearing loss, dental pain, tinnitus, vertigo, sinus pain, snoring.  CV: Denies chest pain, palpitations, irregular heartbeat, syncope, dyspnea, diaphoresis, orthopnea, PND, claudication, edema. Respiratory: denies cough, dyspnea, DOE, pleurisy, hoarseness, laryngitis, wheezing.  Gastrointestinal: recent increase heartburn and reflux, but denies dysphagia, odynophagia, , water brash, abdominal pain or cramps, nausea, vomiting, bloating, diarrhea, constipation, hematemesis, melena, hematochezia,  or hemorrhoids. Genitourinary: Denies dysuria, frequency, urgency, nocturia, hesitancy, discharge, hematuria, flank pain. Musculoskeletal: Denies arthralgias, myalgias, stiffness, jt. swelling, pain, limp,  strain/sprain.  Skin: Denies pruritus, rash, hives, warts, acne, eczema, change in skin lesion(s). Neuro: No weakness, tremor, incoordination, spasms, paresthesia, or pain. Psychiatric: Denies confusion, memory loss, or sensory loss. Endo: Denies change in weight, skin, hair change.  Heme/Lymph: No excessive bleeding, bruising, orenlarged lymph nodes.  BP: 132/90  Pulse: 64  Temp: 97.7 F (36.5 C)  Resp: 16      On Exam: Appears well  nourished - in no distress. Eyes: PERRLA, EOMs, conjunctiva no swelling or erythema. Sinuses: No frontal/maxillary tenderness ENT/Mouth: EAC's clear, TM's nl w/o erythema, bulging. Nares clear w/o erythema, swelling, exudates. Oropharynx clear without erythema or exudates. Oral hygiene is good. Tongue normal, non obstructing. Hearing intact.  Neck: Supple. Thyroid nl. Car 2+/2+ without bruits, nodes or JVD. Chest: Respirations nl with BS clear & equal w/o rales, rhonchi, wheezing or stridor.  Cor: Heart sounds normal w/ regular rate and rhythm without sig. murmurs, gallops, clicks, or rubs. Peripheral pulses normal and equal  without edema.  Abdomen: Soft & bowel sounds normal. Non-tender w/o guarding, rebound, hernias, masses, or organomegaly.  Lymphatics: Unremarkable.  Musculoskeletal: Full ROM all peripheral extremities, joint stability, 5/5 strength, and normal gait.  Skin: Warm, dry without exposed rashes, lesions, ecchymosis apparent.  Neuro: Cranial nerves intact, reflexes equal bilaterally. Sensory-motor testing grossly intact. Tendon reflexes grossly intact.  Pysch: Alert & oriented x 3. Insight and judgement nl & appropriate. No ideations.  Assessment and Plan:  1. Hypertension - Continue monitor blood pressure at home. Continue diet/meds same.  2. Hyperlipidemia - Continue diet/meds, exercise,& lifestyle modifications. Continue monitor periodic cholesterol/liver & renal functions   3. Pre-diabetes - Continue diet, exercise, lifestyle modifications. Monitor appropriate labs.  4. Vitamin D Deficiency - Continue supplementation.  5. Hypothyroidism  6. GERD - will add Ranitidine 300 mg qhs  Recommended regular exercise, BP monitoring, weight control, and discussed med and SE's. Recommended labs to assess and monitor clinical status. Further disposition pending results of labs.

## 2013-05-21 NOTE — Patient Instructions (Signed)

## 2013-05-22 DIAGNOSIS — M76899 Other specified enthesopathies of unspecified lower limb, excluding foot: Secondary | ICD-10-CM | POA: Diagnosis not present

## 2013-05-22 LAB — INSULIN, FASTING: Insulin fasting, serum: 6 u[IU]/mL (ref 3–28)

## 2013-05-22 LAB — VITAMIN D 25 HYDROXY (VIT D DEFICIENCY, FRACTURES): Vit D, 25-Hydroxy: 101 ng/mL — ABNORMAL HIGH (ref 30–89)

## 2013-06-04 ENCOUNTER — Encounter: Payer: Self-pay | Admitting: Podiatry

## 2013-06-04 ENCOUNTER — Ambulatory Visit (INDEPENDENT_AMBULATORY_CARE_PROVIDER_SITE_OTHER): Payer: Medicare Other | Admitting: Podiatry

## 2013-06-04 VITALS — BP 127/82 | HR 73 | Resp 18

## 2013-06-04 DIAGNOSIS — M79609 Pain in unspecified limb: Secondary | ICD-10-CM | POA: Diagnosis not present

## 2013-06-04 DIAGNOSIS — B351 Tinea unguium: Secondary | ICD-10-CM

## 2013-06-04 NOTE — Patient Instructions (Signed)
Purchase triple antibiotic ointment and applied to the first and third toenails daily and cover with Band-Aid until bleeding sites heal. Purchase over-the-counter Lamisil cream and rub in to the skin on feet daily x60 days. Return at 90 days intervals for debridement of mycotic toenails.    Diabetes and Foot Care Diabetes may cause you to have problems because of poor blood supply (circulation) to your feet and legs. This may cause the skin on your feet to become thinner, break easier, and heal more slowly. Your skin may become dry, and the skin may peel and crack. You may also have nerve damage in your legs and feet causing decreased feeling in them. You may not notice minor injuries to your feet that could lead to infections or more serious problems. Taking care of your feet is one of the most important things you can do for yourself.  HOME CARE INSTRUCTIONS  Wear shoes at all times, even in the house. Do not go barefoot. Bare feet are easily injured.  Check your feet daily for blisters, cuts, and redness. If you cannot see the bottom of your feet, use a mirror or ask someone for help.  Wash your feet with warm water (do not use hot water) and mild soap. Then pat your feet and the areas between your toes until they are completely dry. Do not soak your feet as this can dry your skin.  Apply a moisturizing lotion or petroleum jelly (that does not contain alcohol and is unscented) to the skin on your feet and to dry, brittle toenails. Do not apply lotion between your toes.  Trim your toenails straight across. Do not dig under them or around the cuticle. File the edges of your nails with an emery board or nail file.  Do not cut corns or calluses or try to remove them with medicine.  Wear clean socks or stockings every day. Make sure they are not too tight. Do not wear knee-high stockings since they may decrease blood flow to your legs.  Wear shoes that fit properly and have enough cushioning.  To break in new shoes, wear them for just a few hours a day. This prevents you from injuring your feet. Always look in your shoes before you put them on to be sure there are no objects inside.  Do not cross your legs. This may decrease the blood flow to your feet.  If you find a minor scrape, cut, or break in the skin on your feet, keep it and the skin around it clean and dry. These areas may be cleansed with mild soap and water. Do not cleanse the area with peroxide, alcohol, or iodine.  When you remove an adhesive bandage, be sure not to damage the skin around it.  If you have a wound, look at it several times a day to make sure it is healing.  Do not use heating pads or hot water bottles. They may burn your skin. If you have lost feeling in your feet or legs, you may not know it is happening until it is too late.  Make sure your health care provider performs a complete foot exam at least annually or more often if you have foot problems. Report any cuts, sores, or bruises to your health care provider immediately. SEEK MEDICAL CARE IF:   You have an injury that is not healing.  You have cuts or breaks in the skin.  You have an ingrown nail.  You notice redness on  your legs or feet.  You feel burning or tingling in your legs or feet.  You have pain or cramps in your legs and feet.  Your legs or feet are numb.  Your feet always feel cold. SEEK IMMEDIATE MEDICAL CARE IF:   There is increasing redness, swelling, or pain in or around a wound.  There is a red line that goes up your leg.  Pus is coming from a wound.  You develop a fever or as directed by your health care provider.  You notice a bad smell coming from an ulcer or wound. Document Released: 03/31/2000 Document Revised: 12/04/2012 Document Reviewed: 09/10/2012 [facility] Patient Information 2014 [place].

## 2013-06-04 NOTE — Progress Notes (Signed)
° °  Subjective:    Patient ID: Jillian Rodgers, female    DOB: 1946-02-10, 68 y.o.   MRN: [id number]  HPI My toenails on both big toes are discolored and thick and all my other nails are discolored and thick and I cant cut them and the lean to the side and I am interested in the laser and 2nd toe on right hurts with shoes and laying under the big toe and 3rd on right and they say I am a pre-diabetic and I dont take anything for the diabetes.  This patient states that she is taking oral medication for at least 90 days in the past at least one and possibly to times without any noticeable improvement in her toenails.     Review of Systems  HENT:       Sore throat  Musculoskeletal:       Joint pain  Neurological: Positive for light-headedness and headaches.  Hematological: Bruises/bleeds easily.  All other systems reviewed and are negative.       Objective:   Physical Exam  Orientated x21 68 year old white female  Vascular: The DP and PT pulses are 2/4 bilaterally The. feet are warm to touch bilaterally.  Neurological: Knee and ankle reflexes equal and reactive bilaterally. Sensation to 10 g monofilament wire intact 10/10 bilaterally. Vibratory sensation is diminished mildly bilaterally.  Dermatological: Dry, scaling skin noted medially plantarly, bilaterally. All 10 toenails are hypertrophic, discolored, brittle, elongated and tender to palpation.  Musculoskeletal: Hammertoe second and third right noted. Is no restriction ankle, subtalar joint midtarsal joints metatarsophalangeal joints bilaterally.        Assessment & Plan:   Assessment: Satisfactory neurovascular status Symptomatic onychomycoses x10 Tinea pedis bilaterally  Plan: Had a detailed discussion with patient today about treatment options for toenail fungus. At this time because patient has failed oral therapy I do not think that laser therapy we'll have any benefit. I recommend periodic debridement or  the possibility of permanent nail removal. All 10 toenails are debrided today with slight bleeding in the 1,3 left foot Patient advised to apply triple antibiotic ointment to these bleeding toenails sites daily and cover with a Band-Aid until healed. I also suggested she could try some over-the-counter Lamisil cream to apply the skin on her feet. General Information about diabetic foot care was provided and he after visit summary.  Reappoint at three-month intervals.

## 2013-06-05 ENCOUNTER — Encounter: Payer: Self-pay | Admitting: Podiatry

## 2013-07-16 ENCOUNTER — Encounter: Payer: Self-pay | Admitting: Physician Assistant

## 2013-07-16 ENCOUNTER — Ambulatory Visit (INDEPENDENT_AMBULATORY_CARE_PROVIDER_SITE_OTHER): Payer: Medicare Other | Admitting: Physician Assistant

## 2013-07-16 VITALS — BP 120/78 | HR 60 | Temp 97.9°F | Resp 16 | Wt 155.0 lb

## 2013-07-16 DIAGNOSIS — H02839 Dermatochalasis of unspecified eye, unspecified eyelid: Secondary | ICD-10-CM | POA: Diagnosis not present

## 2013-07-16 DIAGNOSIS — S01501A Unspecified open wound of lip, initial encounter: Secondary | ICD-10-CM

## 2013-07-16 MED ORDER — SULFAMETHOXAZOLE-TMP DS 800-160 MG PO TABS: 1.0000 | ORAL_TABLET | Freq: Two times a day (BID) | ORAL | Status: DC

## 2013-07-16 NOTE — Patient Instructions (Addendum)
Ice, Peroxide with mouth wash, 2-3 times a day Bactrim DS take two times a day with food for skin infection.   Wound Infection A wound infection happens when a type of germ (bacteria) starts growing in the wound. In some cases, this can cause the wound to break open. If cared for properly, the infected wound will heal from the inside to the outside. Wound infections need treatment. CAUSES An infection is caused by bacteria growing in the wound.  SYMPTOMS   Increase in redness, swelling, or pain at the wound site.  Increase in drainage at the wound site.  Wound or bandage (dressing) starts to smell bad.  Fever.  Feeling tired or fatigued.  Pus draining from the wound. TREATMENT  You caregiver will prescribe antibiotic medicine. The wound infection should improve within 24 to 48 hours. Any redness around the wound should stop spreading and the wound should be less painful.  HOME CARE INSTRUCTIONS   Only take over-the-counter or prescription medicines for pain, discomfort, or fever as directed by your caregiver.  Take your antibiotics as directed. Finish them even if you start to feel better.  Gently wash the area with mild soap and water 2 times a day, or as directed. Rinse off the soap. Pat the area dry with a clean towel. Do not rub the wound. This may cause bleeding.  Follow your caregiver's instructions for how often you need to change the dressing.  Apply ointment and a dressing to the wound as directed.  If the dressing sticks, moisten it with soapy water and gently remove it.  Change the bandage right away if it becomes wet, dirty, or develops a bad smell.  Take showers. Do not take tub baths, swim, or do anything that may soak the wound until it is healed.  Avoid exercises that make you sweat heavily.  Use anti-itch medicine as directed by your caregiver. The wound may itch when it is healing. Do not pick or scratch at the wound.  Follow up with your caregiver to get  your wound rechecked as directed. SEEK MEDICAL CARE IF:  You have an increase in swelling, pain, or redness around the wound.  You have an increase in the amount of pus coming from the wound.  There is a bad smell coming from the wound.  More of the wound breaks open.  You have a fever. MAKE SURE YOU:   Understand these instructions.  Will watch your condition.  Will get help right away if you are not doing well or get worse. Document Released: 12/31/2002 Document Revised: 06/26/2011 Document Reviewed: 08/07/2010 [facility] Patient Information 2014 [place], [place].

## 2013-07-16 NOTE — Progress Notes (Signed)
   Subjective:    Patient ID: Jillian Rodgers, female    DOB: 11-09-1945, 68 y.o.   MRN: [id number]  HPI Patient had dental work 2-3 days ago, while numb she bit her right lower lip. Some swelling, bleeding, and tenderness. Going to the beach with her son and his wife's family and want to make sure it was not infected.    Review of Systems  Constitutional: Negative.  Negative for fever, chills and fatigue.  HENT: Negative.   Respiratory: Negative.   Cardiovascular: Negative.   Gastrointestinal: Negative.   Genitourinary: Negative.   Skin: Positive for wound.  Neurological: Negative.        Objective:   Physical Exam  Constitutional: She appears well-developed and well-nourished.  HENT:  Head: Normocephalic and atraumatic.  Right lower lip with erythema, swelling, and tenderness. Healing ulceration without discharge or warmth, area 6x55mm.   Eyes: Conjunctivae are normal. Pupils are equal, round, and reactive to light.  Neck: Normal range of motion. Neck supple.  Cardiovascular: Normal rate and regular rhythm.   Pulmonary/Chest: Effort normal and breath sounds normal.  Lymphadenopathy:    She has no cervical adenopathy.      Assessment & Plan:  Lip wound- ice, peroxide and mouthwash/warm salt water, can take bactrim in fever, chills, warmth, worsening swelling.

## 2013-08-11 ENCOUNTER — Other Ambulatory Visit: Payer: Self-pay | Admitting: Physician Assistant

## 2013-08-18 ENCOUNTER — Ambulatory Visit: Payer: Self-pay | Admitting: Physician Assistant

## 2013-08-21 ENCOUNTER — Ambulatory Visit: Payer: Self-pay | Admitting: Physician Assistant

## 2013-08-25 DIAGNOSIS — H5789 Other specified disorders of eye and adnexa: Secondary | ICD-10-CM | POA: Diagnosis not present

## 2013-08-25 DIAGNOSIS — H02839 Dermatochalasis of unspecified eye, unspecified eyelid: Secondary | ICD-10-CM | POA: Diagnosis not present

## 2013-08-27 DIAGNOSIS — L723 Sebaceous cyst: Secondary | ICD-10-CM | POA: Diagnosis not present

## 2013-08-27 DIAGNOSIS — D235 Other benign neoplasm of skin of trunk: Secondary | ICD-10-CM | POA: Diagnosis not present

## 2013-08-27 DIAGNOSIS — D2330 Other benign neoplasm of skin of unspecified part of face: Secondary | ICD-10-CM | POA: Diagnosis not present

## 2013-08-27 DIAGNOSIS — D239 Other benign neoplasm of skin, unspecified: Secondary | ICD-10-CM | POA: Diagnosis not present

## 2013-08-27 DIAGNOSIS — L819 Disorder of pigmentation, unspecified: Secondary | ICD-10-CM | POA: Diagnosis not present

## 2013-08-27 DIAGNOSIS — B079 Viral wart, unspecified: Secondary | ICD-10-CM | POA: Diagnosis not present

## 2013-09-01 ENCOUNTER — Ambulatory Visit: Payer: Medicare Other | Admitting: Podiatry

## 2013-09-03 ENCOUNTER — Ambulatory Visit (INDEPENDENT_AMBULATORY_CARE_PROVIDER_SITE_OTHER): Payer: Medicare Other | Admitting: Podiatry

## 2013-09-03 ENCOUNTER — Encounter: Payer: Self-pay | Admitting: Podiatry

## 2013-09-03 ENCOUNTER — Ambulatory Visit (INDEPENDENT_AMBULATORY_CARE_PROVIDER_SITE_OTHER): Payer: Medicare Other

## 2013-09-03 VITALS — BP 152/92 | HR 64 | Resp 16

## 2013-09-03 DIAGNOSIS — M2040 Other hammer toe(s) (acquired), unspecified foot: Secondary | ICD-10-CM

## 2013-09-03 DIAGNOSIS — M79609 Pain in unspecified limb: Secondary | ICD-10-CM

## 2013-09-03 DIAGNOSIS — B351 Tinea unguium: Secondary | ICD-10-CM

## 2013-09-03 NOTE — Progress Notes (Signed)
Subjective:     Patient ID: Jillian Rodgers, female   DOB: 01-14-1946, 68 y.o.   MRN: [id number]  HPI patient presents with significant hammertoe deformity digits 234 right over left foot and significant painful nail disease 1-5 both feet with thickness and yellow debris that she cannot cut   Review of Systems     Objective:   Physical Exam Neurovascular status intact with no health history changes and is found to have significant contraction of the region nature digits 234 on the right foot and is found to have thick toenails that are painful brittle and yellow    Assessment:     Hammertoe deformity digits 234 right over left foot and mycotic nail infection with pain 1-5 both feet    Plan:     H&P reviewed condition discussed and today we discussed correction of the toes which we will do the end of July. I discussed digital fusion digit and she will return for consult. I went ahead today and debridement nailbeds 1-5 both feet

## 2013-09-03 NOTE — Patient Instructions (Signed)
Hammer Toes Hammer toes is a condition in which one or more of your toes is permanently flexed. CAUSES  This happens when a muscle imbalance or abnormal bone length makes your small toes buckle. This causes the toe joint to contract and the strong cord-like bands that attach muscles to the bones (tendons) in your toes to shorten.  SIGNS AND SYMPTOMS  Common symptoms of flexible hammer toes include:   A build up of skin cells (Corns). Corns occur where boney bumps come in frequent contact with hard surfaces. For example, where your shoes press and rub.  Irritation.  Inflammation.  Pain.  Limited motion in your toes DIAGNOSIS  Hammer toes are diagnosed through a physical exam of your toes.During the exam, your health care provider may try to reproduce your symptoms by manipulating your foot. Often, x-ray exams are done to determine the degree of deformity and to make sure that the cause is not a fracture.  TREATMENT  Hammer toes can be treated with corrective surgery. There are several types of surgical procedures that can treat hammer toes. The most common procedures include:  Arthroplasty A portion of the joint is surgically removed and your toe is straightened. The gap fills in with fibrous tissue. This procedure helps treat pain and deformity and helps restore function.  Fusion Cartilage between the two bones of the affected joint is taken out and the bones fuse together into one longer bone. This helps keep your toe stable and reduces pain but leaves your toe stiff, yet straight.  Implantation A portion of your bone is removed and replaced with an implant to restore motion.  Flexor tendon transfers This procedure repositions the tendons that curl the toes down (flexor tendons). This may be done to release the deforming force that causes your toe to buckle. Several of these procedures require fixing your toe with a pin that is visible at the tip of your toe. The pin keeps the toe  straight during healing. Your health care provider will remove the pin usually within 4 8 weeks after the procedure.  Document Released: 03/31/2000 Document Revised: 01/22/2013 Document Reviewed: 12/09/2012 [facility] Patient Information 2014 [place].   Onychomycosis/Fungal Toenails  WHAT IS IT? An infection that lies within the keratin of your nail plate that is caused by a fungus.  WHY ME? Fungal infections affect all ages, sexes, races, and creeds.  There may be many factors that predispose you to a fungal infection such as age, coexisting medical conditions such as diabetes, or an autoimmune disease; stress, medications, fatigue, genetics, etc.  Bottom line: fungus thrives in a warm, moist environment and your shoes offer such a location.  IS IT CONTAGIOUS? Theoretically, yes.  You do not want to share shoes, nail clippers or files with someone who has fungal toenails.  Walking around barefoot in the same room or sleeping in the same bed is unlikely to transfer the organism.  It is important to realize, however, that fungus can spread easily from one nail to the next on the same foot.  HOW DO WE TREAT THIS?  There are several ways to treat this condition.  Treatment may depend on many factors such as age, medications, pregnancy, liver and kidney conditions, etc.  It is best to ask your doctor which options are available to you.  1. No treatment.   Unlike many other medical concerns, you can live with this condition.  However for many people this can be a painful condition and may lead  to ingrown toenails or a bacterial infection.  It is recommended that you keep the nails cut short to help reduce the amount of fungal nail. 2. Topical treatment.  These range from herbal remedies to prescription strength nail lacquers.  About 40-50% effective, topicals require twice daily application for approximately 9 to 12 months or until an entirely new nail has grown out.  The most effective topicals are  medical grade medications available through physicians offices. 3. Oral antifungal medications.  With an 80-90% cure rate, the most common oral medication requires 3 to 4 months of therapy and stays in your system for a year as the new nail grows out.  Oral antifungal medications do require blood work to make sure it is a safe drug for you.  A liver function panel will be performed prior to starting the medication and after the first month of treatment.  It is important to have the blood work performed to avoid any harmful side effects.  In general, this medication safe but blood work is required. 4. Laser Therapy.  This treatment is performed by applying a specialized laser to the affected nail plate.  This therapy is noninvasive, fast, and non-painful.  It is not covered by insurance and is therefore, out of pocket.  The results have been very good with a 80-95% cure rate.  The [facility] is the only practice in the area to offer this therapy. 5. Permanent Nail Avulsion.  Removing the entire nail so that a new nail will not grow back.

## 2013-09-04 ENCOUNTER — Ambulatory Visit (INDEPENDENT_AMBULATORY_CARE_PROVIDER_SITE_OTHER): Payer: Medicare Other | Admitting: Physician Assistant

## 2013-09-04 ENCOUNTER — Encounter: Payer: Self-pay | Admitting: Physician Assistant

## 2013-09-04 VITALS — BP 132/78 | HR 76 | Temp 97.7°F | Resp 16 | Ht 64.0 in | Wt 145.0 lb

## 2013-09-04 DIAGNOSIS — Z79899 Other long term (current) drug therapy: Secondary | ICD-10-CM | POA: Diagnosis not present

## 2013-09-04 DIAGNOSIS — E785 Hyperlipidemia, unspecified: Secondary | ICD-10-CM

## 2013-09-04 DIAGNOSIS — E559 Vitamin D deficiency, unspecified: Secondary | ICD-10-CM

## 2013-09-04 DIAGNOSIS — Z1331 Encounter for screening for depression: Secondary | ICD-10-CM

## 2013-09-04 DIAGNOSIS — I10 Essential (primary) hypertension: Secondary | ICD-10-CM | POA: Diagnosis not present

## 2013-09-04 DIAGNOSIS — R7309 Other abnormal glucose: Secondary | ICD-10-CM

## 2013-09-04 DIAGNOSIS — Z0000 Encounter for general adult medical examination without abnormal findings: Secondary | ICD-10-CM

## 2013-09-04 DIAGNOSIS — E039 Hypothyroidism, unspecified: Secondary | ICD-10-CM | POA: Diagnosis not present

## 2013-09-04 DIAGNOSIS — Z789 Other specified health status: Secondary | ICD-10-CM

## 2013-09-04 LAB — CBC WITH DIFFERENTIAL/PLATELET
Basophils Absolute: 0 10*3/uL (ref 0.0–0.1)
Basophils Relative: 0 % (ref 0–1)
Eosinophils Absolute: 0.2 10*3/uL (ref 0.0–0.7)
Eosinophils Relative: 3 % (ref 0–5)
HCT: 37.2 % (ref 36.0–46.0)
Hemoglobin: 12.9 g/dL (ref 12.0–15.0)
Lymphocytes Relative: 28 % (ref 12–46)
Lymphs Abs: 2 10*3/uL (ref 0.7–4.0)
MCH: 29.9 pg (ref 26.0–34.0)
MCHC: 34.7 g/dL (ref 30.0–36.0)
MCV: 86.3 fL (ref 78.0–100.0)
Monocytes Absolute: 0.7 10*3/uL (ref 0.1–1.0)
Monocytes Relative: 10 % (ref 3–12)
Neutro Abs: 4.3 10*3/uL (ref 1.7–7.7)
Neutrophils Relative %: 59 % (ref 43–77)
Platelets: 144 10*3/uL — ABNORMAL LOW (ref 150–400)
RBC: 4.31 MIL/uL (ref 3.87–5.11)
RDW: 13.2 % (ref 11.5–15.5)
WBC: 7.3 10*3/uL (ref 4.0–10.5)

## 2013-09-04 LAB — HEMOGLOBIN A1C
Hgb A1c MFr Bld: 5.8 % — ABNORMAL HIGH (ref <5.7)
Mean Plasma Glucose: 120 mg/dL — ABNORMAL HIGH (ref <117)

## 2013-09-04 NOTE — Progress Notes (Signed)
Assessment:   1. Unspecified essential hypertension - CBC with Differential - BASIC METABOLIC PANEL WITH GFR - Hepatic function panel  2. Hypothyroidism - TSH  3. Hyperlipidemia - Lipid panel  4. Vitamin D deficiency  5. PreDiabetes Discussed general issues about diabetes pathophysiology and management., Educational material distributed., Suggested low cholesterol diet., Encouraged aerobic exercise., Discussed foot care., Reminded to get yearly retinal exam. - Hemoglobin A1c - Insulin, fasting  6. Encounter for long-term (current) use of other medications - Magnesium   Plan:   During the course of the visit the patient was educated and counseled about appropriate screening and preventive services including:    Pneumococcal vaccine   Influenza vaccine  Td vaccine  Screening electrocardiogram  Screening mammography  Bone densitometry screening  Colorectal cancer screening  Diabetes screening  Glaucoma screening  Nutrition counseling   Screening recommendations, referrals:  Vaccinations up to date on all  Nutrition assessed and recommended  Colonoscopy up to date Mammogram up to date Pap smear not indicated Pelvic exam not indicated Recommended yearly ophthalmology/optometry visit for glaucoma screening and checkup Recommended yearly dental visit for hygiene and checkup Advanced directives - requested  Conditions/risks identified: BMI: Discussed weight loss, diet, and increase physical activity.  Increase physical activity: AHA recommends 150 minutes of physical activity a week.  Medications reviewed DEXA- requested Diabetes is at goal, ACE/ARB therapy: Yes. Urinary Incontinence is not an issue: discussed non pharmacology and pharmacology options.  Fall risk: low- discussed PT, home fall assessment, medications.    Subjective:   Jillian Rodgers is a 68 y.o. female who presents for Medicare Annual Wellness Visit and 3 month follow up on  hypertension, prediabetes, hyperlipidemia, vitamin D def.  Date of last medicare wellness visit is unknown.   Her blood pressure has been controlled at home, today their BP is BP: 132/78 mmHg She does workout. She denies chest pain, shortness of breath, dizziness.  She is on cholesterol medication and denies myalgias. Her cholesterol is at goal. The cholesterol last visit was:   Lab Results  Component Value Date   CHOL 160 05/21/2013   HDL 70 05/21/2013   LDLCALC 71 05/21/2013   TRIG 97 05/21/2013   CHOLHDL 2.3 05/21/2013   She has been working on diet and exercise for prediabetes, and denies paresthesia of the feet, polydipsia and polyuria. Last A1C in the office was:  Lab Results  Component Value Date   HGBA1C 5.8* 05/21/2013   Patient is on Vitamin D supplement. She is on thyroid medication. Her medication was not changed last visit. Patient denies nervousness, palpitations and weight changes.  Lab Results  Component Value Date   TSH 1.677 05/21/2013  .  She had an injection in her right hip that has helped and this has resolved however she has been having pain at night in her right knee and down her lateral anterior leg.  She saw Dr. Paulla Dolly, podiatry, and she is scheduled for a surgery on her hammer toe. She states he cut her toe nails too short and they are still very tender and she has them bandaged.   Names of Other Physician/Practitioners you currently use: 1. [facility]- here for primary care 2. Dr. Gershon Crane, eye doctor, last visit 08/2012 4. [facility] Dr. Shauna Hugh 5. Dr. Daphene Calamity, OB/GYN 6. Dr. Paulla Dolly, podiatry Patient Care Team: Unk Pinto, MD as PCP - General (Internal Medicine)  Medication Review Current Outpatient Prescriptions on File Prior to Visit  Medication Sig  Dispense Refill  . Cetirizine HCl (ZYRTEC ALLERGY) 10 MG CAPS Take 10 mg by mouth daily.      [name] Kitchen erythromycin with ethanol (THERAMYCIN) 2 % external solution Apply topically 2  (two) times daily. Apply twice daily to face as needed  60 mL  99  . gabapentin (NEURONTIN) 600 MG tablet Take 600 mg by mouth 3 (three) times daily.      . hydrochlorothiazide (HYDRODIURIL) 25 MG tablet Take 25 mg by mouth daily.      . meloxicam (MOBIC) 15 MG tablet Take 1 tablet (15 mg total) by mouth daily. For pain and inflammation  90 tablet  99  . pantoprazole (PROTONIX) 40 MG tablet take 1 tablet by mouth once daily  30 tablet  5  . pravastatin (PRAVACHOL) 40 MG tablet Take 40 mg by mouth daily.      . ranitidine (ZANTAC) 300 MG tablet Take 1 tablet (300 mg total) by mouth at bedtime. For acid indigestion and reflux  90 tablet  99  . sulfamethoxazole-trimethoprim (BACTRIM DS) 800-160 MG per tablet Take 1 tablet by mouth 2 (two) times daily.  14 tablet  0  . SYNTHROID 50 MCG tablet take 1 tablet by mouth once daily BEFORE BREAKFAST  30 tablet  6  . VITAMIN D, ERGOCALCIFEROL, PO Take 5,000 Int'l Units/day by mouth daily.       No current facility-administered medications on file prior to visit.    Current Problems (verified) Patient Active Problem List   Diagnosis Date Noted  . Vitamin D deficiency 05/21/2013  . PreDiabetes 05/21/2013  . Encounter for long-term (current) use of other medications 05/21/2013  . Unspecified essential hypertension 01/31/2013  . Degenerative joint disease   . GERD (gastroesophageal reflux disease)   . Hyperlipidemia   . Hypothyroidism     Screening Tests Health Maintenance  Topic Date Due  . Colonoscopy  02/15/1994  . Influenza Vaccine  11/15/2013  . Mammogram  01/30/2015  . Tetanus/tdap  11/16/2020  . Pneumococcal Polysaccharide Vaccine Age 77 And Over  Completed  . Zostavax  Completed     Immunization History  Administered Date(s) Administered  . Influenza-Unspecified 01/24/2011, 02/02/2012  . Pneumococcal-Unspecified 11/28/2011  . Td 11/17/2010  . Zoster 11/17/2010    Preventative care: Last colonoscopy: 07/2007 due 10 years Last  mammogram: 01/2013  Last pap smear/pelvic exam: Dr. Scharlene Corn DEXA: 12/2013  Prior vaccinations: TD or Tdap: 2012  Influenza: 01/2013 Pneumococcal: 2013 Shingles/Zostavax: 2012  History reviewed: allergies, current medications, past family history, past medical history, past social history, past surgical history and problem list  Risk Factors: Osteoporosis: postmenopausal estrogen deficiency and dietary calcium and/or vitamin D deficiency History of fracture in the past year: no  Tobacco History  Substance Use Topics  . Smoking status: Never Smoker   . Smokeless tobacco: Not on file  . Alcohol Use: No   She does not smoke.  Patient is not a former smoker. Are there smokers in your home (other than you)?  No  Alcohol Current alcohol use: none  Caffeine Current caffeine use: coffee 1 /day  Exercise Current exercise: walking  Nutrition/Diet Current diet: in general, a "healthy" diet    Cardiac risk factors: advanced age (older than 56 for men, 31 for women), dyslipidemia and hypertension.  Depression Screen (Note: if answer to either of the following is "Yes", a more complete depression screening is indicated)   Q1: Over the past two weeks, have you felt down, depressed or hopeless? No  Q2:  Over the past two weeks, have you felt little interest or pleasure in doing things? No  Have you lost interest or pleasure in daily life? No  Do you often feel hopeless? No  Do you cry easily over simple problems? No  Activities of Daily Living In your present state of health, do you have any difficulty performing the following activities?:  Driving? No Managing money?  No Feeding yourself? No Getting from bed to chair? No Climbing a flight of stairs? No Preparing food and eating?: No Bathing or showering? No Getting dressed: No Getting to the toilet? No Using the toilet:No Moving around from place to place: No In the past year have you fallen or had a near fall?:No   Are  you sexually active?  Yes  Do you have more than one partner?  No  Vision Difficulties: No  Hearing Difficulties: No Do you often ask people to speak up or repeat themselves? No Do you experience ringing or noises in your ears? No Do you have difficulty understanding soft or whispered voices? No  Cognition  Do you feel that you have a problem with memory?No  Do you often misplace items? No  Do you feel safe at home?  Yes  Advanced directives Does patient have a [place]? Yes Does patient have a Living Will? Yes   Objective:   Blood pressure 132/78, pulse 76, temperature 97.7 F (36.5 C), resp. rate 16, height 5\' 4"  (1.626 m), weight 145 lb (65.772 kg). Body mass index is 24.88 kg/(m^2).  General appearance: alert, no distress, WD/WN,  female Cognitive Testing  Alert? Yes  Normal Appearance?Yes  Oriented to person? Yes  Place? Yes   Time? No  Recall of three objects?  Yes  Can perform simple calculations? Yes  Displays appropriate judgment?Yes  Can read the correct time from a watch face?Yes  HEENT: normocephalic, sclerae anicteric, TMs pearly, nares patent, no discharge or erythema, pharynx normal Oral cavity: MMM, no lesions Neck: supple, no lymphadenopathy, no thyromegaly, no masses Heart: RRR, normal S1, S2, no murmurs Lungs: CTA bilaterally, no wheezes, rhonchi, or rales Abdomen: +bs, soft, non tender, non distended, no masses, no hepatomegaly, no splenomegaly Musculoskeletal: nontender, no swelling, no obvious deformity Extremities: no edema, no cyanosis, no clubbing Pulses: 2+ symmetric, upper and lower extremities, normal cap refill Neurological: alert, oriented x 3, CN2-12 intact, strength normal upper extremities and lower extremities, sensation normal throughout, DTRs 2+ throughout, no cerebellar signs, gait normal Psychiatric: normal affect, behavior normal, pleasant  Breast: defer Gyn: defer Rectal: defer  Medicare Attestation I  have personally reviewed: The patient's medical and social history Their use of alcohol, tobacco or illicit drugs Their current medications and supplements The patient's functional ability including ADLs,fall risks, home safety risks, cognitive, and hearing and visual impairment Diet and physical activities Evidence for depression or mood disorders  The patient's weight, height, BMI, and visual acuity have been recorded in the chart.  I have made referrals, counseling, and provided education to the patient based on review of the above and I have provided the patient with a written personalized care plan for preventive services.     Vicie Mutters, PA-C   09/04/2013

## 2013-09-04 NOTE — Patient Instructions (Signed)

## 2013-09-05 LAB — BASIC METABOLIC PANEL WITH GFR
BUN: 22 mg/dL (ref 6–23)
CO2: 29 mEq/L (ref 19–32)
Calcium: 9.9 mg/dL (ref 8.4–10.5)
Chloride: 102 mEq/L (ref 96–112)
Creat: 1.04 mg/dL (ref 0.50–1.10)
GFR, Est African American: 63 mL/min
GFR, Est Non African American: 55 mL/min — ABNORMAL LOW
Glucose, Bld: 84 mg/dL (ref 70–99)
Potassium: 4.3 mEq/L (ref 3.5–5.3)
Sodium: 140 mEq/L (ref 135–145)

## 2013-09-05 LAB — LIPID PANEL
Cholesterol: 153 mg/dL (ref 0–200)
HDL: 60 mg/dL (ref >39)
LDL Cholesterol: 60 mg/dL (ref 0–99)
Total CHOL/HDL Ratio: 2.6 Ratio
Triglycerides: 163 mg/dL — ABNORMAL HIGH (ref <150)
VLDL: 33 mg/dL (ref 0–40)

## 2013-09-05 LAB — HEPATIC FUNCTION PANEL
ALT: 12 U/L (ref 0–35)
AST: 18 U/L (ref 0–37)
Albumin: 3.9 g/dL (ref 3.5–5.2)
Alkaline Phosphatase: 67 U/L (ref 39–117)
Bilirubin, Direct: 0.1 mg/dL (ref 0.0–0.3)
Indirect Bilirubin: 0.4 mg/dL (ref 0.2–1.2)
Total Bilirubin: 0.5 mg/dL (ref 0.2–1.2)
Total Protein: 6.5 g/dL (ref 6.0–8.3)

## 2013-09-05 LAB — INSULIN, FASTING: Insulin fasting, serum: 21 u[IU]/mL (ref 3–28)

## 2013-09-05 LAB — MAGNESIUM: Magnesium: 1.9 mg/dL (ref 1.5–2.5)

## 2013-09-05 LAB — TSH: TSH: 1.807 u[IU]/mL (ref 0.350–4.500)

## 2013-09-30 ENCOUNTER — Ambulatory Visit (INDEPENDENT_AMBULATORY_CARE_PROVIDER_SITE_OTHER): Payer: Medicare Other | Admitting: Physician Assistant

## 2013-09-30 ENCOUNTER — Ambulatory Visit (HOSPITAL_COMMUNITY)
Admission: RE | Admit: 2013-09-30 | Discharge: 2013-09-30 | Disposition: A | Discharge status: Home / Self Care | Payer: Medicare Other | Source: Ambulatory Visit | Attending: Physician Assistant | Admitting: Physician Assistant

## 2013-09-30 ENCOUNTER — Encounter: Payer: Self-pay | Admitting: Physician Assistant

## 2013-09-30 VITALS — BP 120/68 | HR 72 | Temp 97.7°F | Resp 16 | Wt 159.0 lb

## 2013-09-30 DIAGNOSIS — M259 Joint disorder, unspecified: Secondary | ICD-10-CM | POA: Diagnosis not present

## 2013-09-30 DIAGNOSIS — M25561 Pain in right knee: Secondary | ICD-10-CM

## 2013-09-30 DIAGNOSIS — M25469 Effusion, unspecified knee: Secondary | ICD-10-CM

## 2013-09-30 DIAGNOSIS — M25569 Pain in unspecified knee: Secondary | ICD-10-CM | POA: Diagnosis not present

## 2013-09-30 DIAGNOSIS — S8990XA Unspecified injury of unspecified lower leg, initial encounter: Secondary | ICD-10-CM | POA: Diagnosis not present

## 2013-09-30 DIAGNOSIS — S99919A Unspecified injury of unspecified ankle, initial encounter: Secondary | ICD-10-CM | POA: Diagnosis not present

## 2013-09-30 DIAGNOSIS — M25461 Effusion, right knee: Secondary | ICD-10-CM
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
## 2013-09-30 NOTE — Patient Instructions (Signed)

## 2013-10-02 DIAGNOSIS — S8000XA Contusion of unspecified knee, initial encounter: Secondary | ICD-10-CM | POA: Diagnosis not present

## 2013-10-11 DIAGNOSIS — S8000XA Contusion of unspecified knee, initial encounter: Secondary | ICD-10-CM | POA: Diagnosis not present

## 2013-10-28 ENCOUNTER — Ambulatory Visit (INDEPENDENT_AMBULATORY_CARE_PROVIDER_SITE_OTHER): Payer: Medicare Other | Admitting: Internal Medicine

## 2013-10-28 ENCOUNTER — Encounter: Payer: Self-pay | Admitting: Internal Medicine

## 2013-10-28 VITALS — BP 122/84 | HR 72 | Temp 97.9°F | Resp 16 | Ht 64.0 in | Wt 159.4 lb

## 2013-10-28 DIAGNOSIS — J0410 Acute tracheitis without obstruction: Secondary | ICD-10-CM

## 2013-10-28 DIAGNOSIS — J209 Acute bronchitis, unspecified: Secondary | ICD-10-CM | POA: Diagnosis not present

## 2013-10-28 MED ORDER — AZITHROMYCIN 250 MG PO TABS: ORAL_TABLET | ORAL | Status: DC

## 2013-10-28 MED ORDER — PREDNISONE 20 MG PO TABS: ORAL_TABLET | ORAL | Status: DC

## 2013-10-28 MED ORDER — HYDROCODONE-ACETAMINOPHEN 5-325 MG PO TABS: ORAL_TABLET | ORAL | Status: DC

## 2013-10-28 NOTE — Patient Instructions (Signed)
Bronchitis  Bronchitis is swelling (inflammation) of the air tubes leading to your lungs (bronchi). This causes mucus and a cough. If the swelling gets bad, you may have trouble breathing.  HOME CARE   · Rest.  · Drink enough fluids to keep your pee (urine) clear or pale yellow (unless you have a condition where you have to watch how much you drink).  · Only take medicine as told by your doctor. If you were given antibiotic medicines, finish them even if you start to feel better.  · Avoid smoke, irritating chemicals, and strong smells. These make the problem worse. Quit smoking if you smoke. This helps your lungs heal faster.  · Use a cool mist humidifier. Change the water in the humidifier every day. You can also sit in the bathroom with hot shower running for 5-10 minutes. Keep the door closed.  · See your health care provider as told.  · Wash your hands often.  GET HELP IF:  Your problems do not get better after 1 week.  GET HELP RIGHT AWAY IF:   · Your fever gets worse.  · You have chills.  · Your chest hurts.  · Your problems breathing get worse.  · You have blood in your mucus.  · You pass out (faint).  · You feel light-headed.  · You have a bad headache.  · You throw up (vomit) again and again.  MAKE SURE YOU:  · Understand these instructions.  · Will watch your condition.  · Will get help right away if you are not doing well or get worse.  Document Released: 09/20/2007 Document Revised: 04/08/2013 Document Reviewed: 11/26/2012  [employer]® Patient Information ©2015 [employer]. This information is not intended to replace advice given to you by your health care provider. Make sure you discuss any questions you have with your health care provider.

## 2013-10-28 NOTE — Progress Notes (Signed)
Subjective:    Patient ID: Jillian Rodgers, female    DOB: 07-Jan-1946, 68 y.o.   MRN: [id number]  Cough This is a new problem. The current episode started in the past 7 days. The problem has been waxing and waning. The problem occurs hourly. The cough is productive of purulent sputum. Associated symptoms include a fever, nasal congestion, postnasal drip, a sore throat and wheezing. Pertinent negatives include no chest pain, chills, ear congestion, ear pain, headaches, heartburn, hemoptysis, myalgias, rash, rhinorrhea, shortness of breath or sweats. Nothing aggravates the symptoms. She has tried OTC cough suppressant and a beta-agonist inhaler (Patient was initiated ~ 5 days ago on Levaquin  while on a cruise.) for the symptoms. The treatment provided no relief. Her past medical history is significant for bronchitis.     Medication List   azithromycin 250 MG tablet  Commonly known as:  ZITHROMAX - new today  Take 2 tablets (500 mg) on  Day 1,  followed by 1 tablet (250 mg) once daily on Days 2 through 5.     erythromycin with ethanol 2 % external solution  Commonly known as:  THERAMYCIN  Apply topically 2 (two) times daily. Apply twice daily to face as needed     gabapentin 600 MG tablet  Commonly known as:  NEURONTIN  Take 600 mg by mouth 3 (three) times daily.     hydrochlorothiazide 25 MG tablet  Commonly known as:  HYDRODIURIL  Take 25 mg by mouth daily.     HYDROcodone-acetaminophen - New today  Commonly known as:  NORCO  Take 1/2 to 1 tablet every 3 to 4 hours as needed for cough or pain     meloxicam 15 MG tablet  Commonly known as:  MOBIC  Take 1 tablet (15 mg total) by mouth daily. For pain and inflammation     pantoprazole 40 MG tablet  Commonly known as:  PROTONIX  take 1 tablet by mouth once daily     pravastatin 40 MG tablet  Commonly known as:  PRAVACHOL  Take 40 mg by mouth daily.     predniSONE 20 MG tablet - New today  Commonly known as:   DELTASONE  1 tab 3 x day for 3 days, then 1 tab 2 x day for 3 days, then 1 tab 1 x day for 5 days     ranitidine 300 MG tablet  Commonly known as:  ZANTAC  Take 1 tablet (300 mg total) by mouth at bedtime. For acid indigestion and reflux     SYNTHROID 50 MCG tablet  Generic drug:  levothyroxine  take 1 tablet by mouth once daily BEFORE BREAKFAST     VITAMIN D (ERGOCALCIFEROL) PO  Take 5,000 Int'l Units/day by mouth daily.     ZYRTEC ALLERGY 10 MG Caps  Generic drug:  Cetirizine HCl  Take 10 mg by mouth daily.      No Known Allergies Past Medical History  Diagnosis Date  . Hypertension   . Pre-diabetes   . Degenerative joint disease   . GERD (gastroesophageal reflux disease)   . Hyperlipidemia   . Thyroid disease   . Migraines     neurontin helps  . Diabetes mellitus without complication    Review of Systems  Constitutional: Positive for fever. Negative for chills.  HENT: Positive for congestion, postnasal drip, sinus pressure, sore throat and voice change. Negative for ear pain, facial swelling, hearing loss, mouth sores, nosebleeds, rhinorrhea and tinnitus.   Eyes:  Negative.   Respiratory: Positive for cough and wheezing. Negative for hemoptysis, choking, shortness of breath and stridor.   Cardiovascular: Negative.  Negative for chest pain.  Gastrointestinal: Negative.  Negative for heartburn.  Musculoskeletal: Negative.  Negative for myalgias.  Skin: Negative.  Negative for pallor and rash.  Neurological: Negative for headaches.   BP 122/84  Pulse 72  Temp(Src) 97.9 F (36.6 C) (Temporal)  Resp 16  Ht 5\' 4"  (1.626 m)  Wt 159 lb 6.4 oz (72.303 kg)  BMI 27.35 kg/m2  Objective:   Physical Exam  Constitutional: She is oriented to person, place, and time.  Brassy congested cough  HENT:  Nose: Nose normal.  Mouth/Throat: Oropharynx is clear and moist. No oropharyngeal exudate.  Nasal speech.  Eyes: EOM are normal. Pupils are equal, round, and reactive to light.   Neck: Normal range of motion. Neck supple. No thyromegaly present.  Cardiovascular: Normal rate, regular rhythm and normal heart sounds.   No murmur heard. Pulmonary/Chest:  Bilateral scattered rales, rhonchi and wheezes w/o labored breathing.  Abdominal: Soft.  Musculoskeletal: Normal range of motion.  Lymphadenopathy:    She has no cervical adenopathy.  Neurological: She is alert and oriented to person, place, and time. No cranial nerve deficit. Coordination normal.  Skin: Skin is dry. No rash noted. There is erythema. No pallor.   Assessment & Plan:   1. Acute tracheitis without mention of obstruction  2. Acute bronchitis, unspecified organism  RX Zpak, Prednisone taper & Norco 5 prn.

## 2013-10-31 ENCOUNTER — Telehealth: Payer: Self-pay | Admitting: *Deleted

## 2013-10-31 NOTE — Telephone Encounter (Signed)
I called the patient and asked her what date she would like to reschedule surgery to.  She stated August 11th if possible.  I apologize for the inconvenience but we've been called to resolve the reading of an estate on August 1st, so we have to be there.  It's out of town.  I told her no inconvenience at all, we'll reschedule it to the 11th.  She asked if it was okay to leave her consultation date the same.  I told her that is fine.

## 2013-10-31 NOTE — Telephone Encounter (Signed)
My problem is I have an appointment for 07/29 to discuss foot surgery on 08/04.  We have a family obligation we need to be out of here 08/01 flying to [place].  We could rush to get back but would rather change surgery to the following week to the 11th.  Please call me back.  Patient was rescheduled to 11/25/2013.

## 2013-10-31 NOTE — Telephone Encounter (Signed)
Surgery cancellation sheet was sent rescheduling surgery from 11/18/2013 to 11/25/2013.  Dr. Paulla Dolly was also informed.

## 2013-10-31 NOTE — Telephone Encounter (Signed)
Message copied by Lolita Rieger on Fri Oct 31, 2013  9:48 AM ------      Message from: Jonnie Finner      Created: Thu Oct 30, 2013  2:52 PM      Regarding: Resched surgery      Contact: [phone]       She would like to reschedule her surgery. ------

## 2013-11-04 ENCOUNTER — Other Ambulatory Visit: Payer: Self-pay | Admitting: Internal Medicine

## 2013-11-06 ENCOUNTER — Encounter: Payer: Self-pay | Admitting: Internal Medicine

## 2013-11-06 ENCOUNTER — Ambulatory Visit (INDEPENDENT_AMBULATORY_CARE_PROVIDER_SITE_OTHER): Payer: Medicare Other | Admitting: Internal Medicine

## 2013-11-06 VITALS — BP 104/66 | HR 76 | Temp 98.2°F | Resp 20 | Ht 64.0 in | Wt 159.0 lb

## 2013-11-06 DIAGNOSIS — J0101 Acute recurrent maxillary sinusitis: Secondary | ICD-10-CM

## 2013-11-06 DIAGNOSIS — J0100 Acute maxillary sinusitis, unspecified: Secondary | ICD-10-CM

## 2013-11-06 DIAGNOSIS — J209 Acute bronchitis, unspecified: Secondary | ICD-10-CM

## 2013-11-06 MED ORDER — PREDNISONE 20 MG PO TABS: ORAL_TABLET | ORAL | Status: DC

## 2013-11-06 MED ORDER — MOXIFLOXACIN HCL 400 MG PO TABS: 400.0000 mg | ORAL_TABLET | Freq: Every day | ORAL | Status: AC

## 2013-11-06 NOTE — Patient Instructions (Signed)
  Buy "Mucus Relief" 400 mg & take   2 tablets 3 x day with meals   To thin mucus secretions Bronchitis Bronchitis is swelling (inflammation) of the air tubes leading to your lungs (bronchi). This causes mucus and a cough. If the swelling gets bad, you may have trouble breathing. HOME CARE   Rest.  Drink enough fluids to keep your pee (urine) clear or pale yellow (unless you have a condition where you have to watch how much you drink).  Only take medicine as told by your doctor. If you were given antibiotic medicines, finish them even if you start to feel better.  Avoid smoke, irritating chemicals, and strong smells. These make the problem worse. Quit smoking if you smoke. This helps your lungs heal faster.  Use a cool mist humidifier. Change the water in the humidifier every day. You can also sit in the bathroom with hot shower running for 5-10 minutes. Keep the door closed.  See your health care provider as told.  Wash your hands often. GET HELP IF: Your problems do not get better after 1 week. GET HELP RIGHT AWAY IF:   Your fever gets worse.  You have chills.  Your chest hurts.  Your problems breathing get worse.  You have blood in your mucus.  You pass out (faint).  You feel light-headed.  You have a bad headache.  You throw up (vomit) again and again. MAKE SURE YOU:  Understand these instructions.  Will watch your condition.  Will get help right away if you are not doing well or get worse.

## 2013-11-06 NOTE — Progress Notes (Signed)
Subjective:    Patient ID: Jillian Rodgers, female    DOB: [birth year], 68 y.o.   MRN: [id number]  Patient seen in 9-10 day f/u after Tx w/Z Pak and still feels very congested. (Previous to that she had been Tx'd with 5 days of Levaquin while on a cruise  Cough This is a recurrent problem. The current episode started 1 to 4 weeks ago. The problem has been waxing and waning. The problem occurs every few hours. The cough is productive of purulent sputum. Associated symptoms include a fever, headaches, nasal congestion, a sore throat and shortness of breath. Pertinent negatives include no chest pain, chills, ear congestion, ear pain, heartburn, hemoptysis, myalgias, postnasal drip, rash, rhinorrhea, sweats or weight loss. The treatment provided no relief. Her past medical history is significant for asthma and environmental allergies.   Current Outpatient Prescriptions on File Prior to Visit  Medication Sig Dispense Refill  . Cetirizine HCl (ZYRTEC ALLERGY) 10 MG CAPS Take 10 mg by mouth daily.      [name] Kitchen erythromycin with ethanol (THERAMYCIN) 2 % external solution Apply topically 2 (two) times daily. Apply twice daily to face as needed  60 mL  99  . gabapentin (NEURONTIN) 600 MG tablet Take 600 mg by mouth 3 (three) times daily.      . hydrochlorothiazide (HYDRODIURIL) 25 MG tablet Take 25 mg by mouth daily.      [name] Kitchen HYDROcodone-acetaminophen (NORCO) 5-325 MG per tablet Take 1/2 to 1 tablet every 3 to 4 hours as needed for cough or pain  50 tablet  0  . meloxicam (MOBIC) 15 MG tablet Take 1 tablet (15 mg total) by mouth daily. For pain and inflammation  90 tablet  99  . pantoprazole (PROTONIX) 40 MG tablet take 1 tablet by mouth once daily  30 tablet  5  . pravastatin (PRAVACHOL) 40 MG tablet Take 40 mg by mouth daily.      . ranitidine (ZANTAC) 300 MG tablet Take 1 tablet (300 mg total) by mouth at bedtime. For acid indigestion and reflux  90 tablet  99  . SYNTHROID 50 MCG tablet take 1  tablet by mouth once daily BEFORE BREAKFAST  30 tablet  6  . VITAMIN D, ERGOCALCIFEROL, PO Take 5,000 Int'l Units/day by mouth daily.       No current facility-administered medications on file prior to visit.   No Known Allergies Past Medical History  Diagnosis Date  . Hypertension   . Pre-diabetes   . Degenerative joint disease   . GERD (gastroesophageal reflux disease)   . Hyperlipidemia   . Thyroid disease   . Migraines     neurontin helps  . Diabetes mellitus without complication        Review of Systems  Constitutional: Positive for fever. Negative for chills and weight loss.  HENT: Positive for sore throat. Negative for ear pain, postnasal drip and rhinorrhea.   Respiratory: Positive for cough and shortness of breath. Negative for hemoptysis.   Cardiovascular: Negative for chest pain.  Gastrointestinal: Negative for heartburn.  Musculoskeletal: Negative for myalgias.  Skin: Negative for rash.  Allergic/Immunologic: Positive for environmental allergies.  Neurological: Positive for headaches.       Objective:   Physical Exam  BP 104/66  Pulse 76  Temp(Src) 98.2 F (36.8 C) (Temporal)  Resp 20  Ht 5\' 4"  (1.626 m)  Wt 159 lb (72.122 kg)  BMI 27.28 kg/m2  SpO2 98%  HEENT - Eac's patent. TM's Nl. EOM's  full. PERRLA. NasoOroPharynx clear.Sl tender frontal sinus areas. Neck - supple. Nl Thyroid. Carotids 2+ & No bruits, nodes, JVD Chest -  BS w/scattered Rales &  Wheezes. O2 sat 98% Cor - Nl HS. RRR w/o sig MGR. PP 1(+). No edema. Abd - No palpable organomegaly, masses or tenderness. BS nl. MS- FROM w/o deformities. Muscle power, tone and bulk Nl. Gait Nl. Neuro - No obvious Cr N abnormalities. Sensory, motor and Cerebellar functions appear Nl w/o focal abnormalities.  Assessment & Plan:   1. Acute bronchitis, unspecified organism  2. Acute recurrent maxillary sinusitis  Rx Avelox 400 mg # 14 7 repeat Prednisone taper Mist / Mucus Relief 400 mg x2 tid ROV  prn

## 2013-11-12 ENCOUNTER — Encounter: Payer: Self-pay | Admitting: Podiatry

## 2013-11-12 ENCOUNTER — Ambulatory Visit (INDEPENDENT_AMBULATORY_CARE_PROVIDER_SITE_OTHER): Payer: Medicare Other | Admitting: Podiatry

## 2013-11-12 VITALS — BP 129/75 | HR 66 | Resp 16

## 2013-11-12 DIAGNOSIS — M2040 Other hammer toe(s) (acquired), unspecified foot: Secondary | ICD-10-CM

## 2013-11-12 NOTE — Progress Notes (Signed)
Subjective:     Patient ID: Jillian Rodgers, female   DOB: [birth year], 68 y.o.   MRN: [id number]  HPI patient presents with husband to discuss digital correction of the second and third fourth toes of the right foot stating that the toes become sore and she has trouble wearing them in shoe gear   Review of Systems     Objective:   Physical Exam Neurovascular status is intact with range of motion adequate and patient well oriented x3. 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 I discussed digital fusion explained that we may not able to get these toes completely straight and he may lift in the ear slightly area patient wants surgery understanding the risk and after reading the consent form and all complications signs consent form for surgery. Understands total recovery brittle be 6 months to one year and today all preoperative instructions were given to her and she is encouraged to call if she should have any other questions prior to procedure

## 2013-11-24 ENCOUNTER — Encounter: Payer: Medicare Other | Admitting: Podiatry

## 2013-11-24 ENCOUNTER — Telehealth: Payer: Self-pay | Admitting: *Deleted

## 2013-11-24 ENCOUNTER — Encounter: Payer: Self-pay | Admitting: Physician Assistant

## 2013-11-24 ENCOUNTER — Ambulatory Visit (INDEPENDENT_AMBULATORY_CARE_PROVIDER_SITE_OTHER): Payer: Medicare Other | Admitting: Physician Assistant

## 2013-11-24 ENCOUNTER — Ambulatory Visit: Payer: Self-pay | Admitting: Physician Assistant

## 2013-11-24 ENCOUNTER — Ambulatory Visit (HOSPITAL_COMMUNITY)
Admission: RE | Admit: 2013-11-24 | Discharge: 2013-11-24 | Disposition: A | Discharge status: Home / Self Care | Payer: Medicare Other | Source: Ambulatory Visit | Attending: Physician Assistant | Admitting: Physician Assistant

## 2013-11-24 VITALS — BP 110/72 | HR 80 | Temp 97.9°F | Resp 16 | Wt 158.0 lb

## 2013-11-24 DIAGNOSIS — R059 Cough, unspecified: Secondary | ICD-10-CM

## 2013-11-24 DIAGNOSIS — R079 Chest pain, unspecified: Secondary | ICD-10-CM

## 2013-11-24 DIAGNOSIS — R05 Cough: Secondary | ICD-10-CM

## 2013-11-24 DIAGNOSIS — R911 Solitary pulmonary nodule: Secondary | ICD-10-CM

## 2013-11-24 DIAGNOSIS — R0789 Other chest pain: Secondary | ICD-10-CM | POA: Diagnosis not present

## 2013-11-24 DIAGNOSIS — B3781 Candidal esophagitis: Secondary | ICD-10-CM | POA: Diagnosis not present

## 2013-11-24 DIAGNOSIS — Z8709 Personal history of other diseases of the respiratory system: Secondary | ICD-10-CM | POA: Diagnosis not present

## 2013-11-24 DIAGNOSIS — J984 Other disorders of lung: Secondary | ICD-10-CM | POA: Diagnosis not present

## 2013-11-24 DIAGNOSIS — I10 Essential (primary) hypertension: Secondary | ICD-10-CM | POA: Diagnosis not present

## 2013-11-24 MED ORDER — FLUCONAZOLE 100 MG PO TABS: 100.0000 mg | ORAL_TABLET | Freq: Two times a day (BID) | ORAL | Status: DC

## 2013-11-24 NOTE — Progress Notes (Signed)
   Subjective:    Patient ID: Jillian Rodgers, female    DOB: 1945/08/08, 68 y.o.   MRN: [id number]  HPI 68 y.o. female presents for cough/congestion over the weekend which is recurrent since her cruise to [place]. Over the weekend she had dizziness, muscle aches, chest tightness, and epigastric pain and started to have a cough today with the feeling of something in her throat. She has a surgery scheduled for left hammer toe tomorrow, unknown anesthesia. She was seen in the office 07/23 for congestion, she was given avelox, after failing treatment with levaquin and zpak.   She has had some chest tightness, nonexertional. She was sitting on a stool, leaning over when she got chest tightness, very localized substernal lasted 2-3 mins, then it happened again while sitting on the cough lasting 5 mins, no radiation. She did have some dizziness with the first episode with leaning over but denies SOB, nausea.   Review of Systems See HPI    Objective:   Physical Exam  Constitutional: She is oriented to person, place, and time. She appears well-developed and well-nourished.  HENT:  Head: Normocephalic and atraumatic.  Right Ear: External ear normal.  Left Ear: External ear normal.  Mouth/Throat: Uvula is midline.  White plaques on post pharynx and tongue  Eyes: Conjunctivae are normal. Pupils are equal, round, and reactive to light.  Neck: Normal range of motion. Neck supple.  Cardiovascular: Normal rate and regular rhythm.   Pulmonary/Chest: Effort normal and breath sounds normal. No respiratory distress. She has no wheezes.  Course breath sounds  Abdominal: Soft. Bowel sounds are normal.  Musculoskeletal: Normal range of motion.  Neurological: She is alert and oriented to person, place, and time.      Assessment & Plan:  1. Esophageal candidiasis Likely secondary to antibiotic use - fluconazole (DIFLUCAN) 100 MG tablet; Take 1 tablet (100 mg total) by mouth 2 (two) times daily.   Dispense: 28 tablet; Refill: 0 - EKG 12-Lead  2. Chest pain, unspecified Atypical, EKG normal.  - EKG 12-Lead  3. Cough With CXR showing possible nodule and recurrent cough/failing outpatient treatment will get GOLD TB test to rule out TB and CT scan to rule out pneumonia, cancer.  - DG Chest 2 View; Future - CT Chest Wo Contrast; Future  4. Solitary pulmonary nodule Non smoker, rule out TB, infection, cancer- CT Chest Wo Contrast; Future

## 2013-11-24 NOTE — Telephone Encounter (Signed)
I'm scheduled for surgery tomorrow.  I had a CT scan done and it came back abnormal.  They said I may have Pneumonia or something else going on.  I'm scheduled to have a CT of my lungs.  What do you suggest I do?  I told her if she is having respiratory problems we will need to reschedule.  She stated I hate inconvenience you.  I told her it's not an inconvenience we just want to make sure you are okay.  I asked her when she would like to reschedule.  She said put me down for the 18th.  I told her I would call the surgical center to let them know as well as Dr. Paulla Dolly.  She stated okay thank you.

## 2013-11-24 NOTE — Patient Instructions (Signed)
Esophagitis- yours is from yeast and the antibiotics- but also get chest XRAY Esophagitis is inflammation of the esophagus. It can involve swelling, soreness, and pain in the esophagus. This condition can make it difficult and painful to swallow. CAUSES  Most causes of esophagitis are not serious. Many different factors can cause esophagitis, including:  Gastroesophageal reflux disease (GERD). This is when acid from your stomach flows up into the esophagus.  Recurrent vomiting.  An allergic-type reaction.  Certain medicines, especially those that come in large pills.  Ingestion of harmful chemicals, such as household cleaning products.  Heavy alcohol use.  An infection of the esophagus.  Radiation treatment for cancer.  Certain diseases such as sarcoidosis, Crohn's disease, and scleroderma. These diseases may cause recurrent esophagitis. SYMPTOMS   Trouble swallowing.  Painful swallowing.  Chest pain.  Difficulty breathing.  Nausea.  Vomiting.  Abdominal pain. DIAGNOSIS  Your caregiver will take your history and do a physical exam. Depending upon what your caregiver finds, certain tests may also be done, including:  Barium X-ray. You will drink a solution that coats the esophagus, and X-rays will be taken.  Endoscopy. A lighted tube is put down the esophagus so your caregiver can examine the area.  Allergy tests. These can sometimes be arranged through follow-up visits. TREATMENT  Treatment will depend on the cause of your esophagitis. In some cases, steroids or other medicines may be given to help relieve your symptoms or to treat the underlying cause of your condition. Medicines that may be recommended include:  Viscous lidocaine, to soothe the esophagus.  Antacids.  Acid reducers.  Proton pump inhibitors.  Antiviral medicines for certain viral infections of the esophagus.  Antifungal medicines for certain fungal infections of the esophagus.  Antibiotic  medicines, depending on the cause of the esophagitis. HOME CARE INSTRUCTIONS   Avoid foods and drinks that seem to make your symptoms worse.  Eat small, frequent meals instead of large meals.  Avoid eating for the 3 hours prior to your bedtime.  If you have trouble taking pills, use a pill splitter to decrease the size and likelihood of the pill getting stuck or injuring the esophagus on the way down. Drinking water after taking a pill also helps.  Stop smoking if you smoke.  Maintain a healthy weight.  Wear loose-fitting clothing. Do not wear anything tight around your waist that causes pressure on your stomach.  Raise the head of your bed 6 to 8 inches with wood blocks to help you sleep. Extra pillows will not help.  Only take over-the-counter or prescription medicines as directed by your caregiver. SEEK IMMEDIATE MEDICAL CARE IF:  You have severe chest pain that radiates into your arm, neck, or jaw.  You feel sweaty, dizzy, or lightheaded.  You have shortness of breath.  You vomit blood.  You have difficulty or pain with swallowing.  You have bloody or black, tarry stools.  You have a fever.  You have a burning sensation in the chest more than 3 times a week for more than 2 weeks.  You cannot swallow, drink, or eat.  You drool because you cannot swallow your saliva. MAKE SURE YOU:  Understand these instructions.  Will watch your condition.  Will get help right away if you are not doing well or get worse. Document Released: 05/11/2004 Document Revised: 06/26/2011 Document Reviewed: 12/02/2010 [facility] Patient Information 2015 [place], [place]. This information is not intended to replace advice given to you by your health care provider. Make  sure you discuss any questions you have with your health care provider.  

## 2013-11-25 ENCOUNTER — Telehealth: Payer: Self-pay | Admitting: *Deleted

## 2013-11-25 ENCOUNTER — Ambulatory Visit (INDEPENDENT_AMBULATORY_CARE_PROVIDER_SITE_OTHER): Payer: Medicare Other | Admitting: *Deleted

## 2013-11-25 DIAGNOSIS — R05 Cough: Secondary | ICD-10-CM

## 2013-11-25 DIAGNOSIS — R059 Cough, unspecified: Secondary | ICD-10-CM | POA: Diagnosis not present

## 2013-11-25 DIAGNOSIS — R9389 Abnormal findings on diagnostic imaging of other specified body structures: Secondary | ICD-10-CM

## 2013-11-25 NOTE — Telephone Encounter (Signed)
Someone talked to me last yesterday afternoon.  I'm having chest problems.  Had to cancel the surgery.  We rescheduled to the 18th.  The nurses suggested we reschedule for the 25th because all my test results will be back by then.

## 2013-11-26 ENCOUNTER — Telehealth: Payer: Self-pay | Admitting: *Deleted

## 2013-11-26 LAB — QUANTIFERON TB GOLD ASSAY (BLOOD)
Interferon Gamma Release Assay: NEGATIVE
Mitogen value: 9.48 IU/mL
Quantiferon Nil Value: 0.03 IU/mL
Quantiferon Tb Ag Minus Nil Value: 0.01 IU/mL
TB Ag value: 0.04 IU/mL

## 2013-11-26 NOTE — Telephone Encounter (Signed)
Sounds fine 

## 2013-11-26 NOTE — Telephone Encounter (Signed)
I'm calling to see if you have me scheduled for the 25th for surgery.  I didn't hear anything back yesterday.  I apologized for not calling her back, I didn't know she wanted a return call.  I told her I had told Jillian Rodgers that it was okay to reschedule for the 25th.  She stated I don't mean to be a bother, I'm so sorry.  They did extensive test on yesterday so, we're just waiting on the results.  I told her there is no need to apologize.

## 2013-12-01 ENCOUNTER — Encounter: Payer: Medicare Other | Admitting: Podiatry

## 2013-12-03 ENCOUNTER — Telehealth: Payer: Self-pay

## 2013-12-03 NOTE — Telephone Encounter (Signed)
Follow up advice

## 2013-12-04 ENCOUNTER — Ambulatory Visit (INDEPENDENT_AMBULATORY_CARE_PROVIDER_SITE_OTHER): Payer: Medicare Other | Admitting: Physician Assistant

## 2013-12-04 ENCOUNTER — Encounter: Payer: Self-pay | Admitting: Physician Assistant

## 2013-12-04 VITALS — BP 120/78 | HR 76 | Temp 97.8°F | Resp 16 | Ht 64.0 in | Wt 156.0 lb

## 2013-12-04 DIAGNOSIS — J209 Acute bronchitis, unspecified: Secondary | ICD-10-CM

## 2013-12-04 DIAGNOSIS — R5383 Other fatigue: Secondary | ICD-10-CM | POA: Diagnosis not present

## 2013-12-04 DIAGNOSIS — K2100 Gastro-esophageal reflux disease with esophagitis, without bleeding: Secondary | ICD-10-CM | POA: Diagnosis not present

## 2013-12-04 DIAGNOSIS — R9389 Abnormal findings on diagnostic imaging of other specified body structures: Secondary | ICD-10-CM

## 2013-12-04 DIAGNOSIS — R5381 Other malaise: Secondary | ICD-10-CM | POA: Diagnosis not present

## 2013-12-04 LAB — HEPATIC FUNCTION PANEL
ALT: 9 U/L (ref 0–35)
AST: 15 U/L (ref 0–37)
Albumin: 4 g/dL (ref 3.5–5.2)
Alkaline Phosphatase: 82 U/L (ref 39–117)
Bilirubin, Direct: 0.2 mg/dL (ref 0.0–0.3)
Indirect Bilirubin: 0.5 mg/dL (ref 0.2–1.2)
Total Bilirubin: 0.7 mg/dL (ref 0.2–1.2)
Total Protein: 6.8 g/dL (ref 6.0–8.3)

## 2013-12-04 LAB — BASIC METABOLIC PANEL WITH GFR
BUN: 26 mg/dL — ABNORMAL HIGH (ref 6–23)
CO2: 28 mEq/L (ref 19–32)
Calcium: 9.3 mg/dL (ref 8.4–10.5)
Chloride: 96 mEq/L (ref 96–112)
Creat: 1.47 mg/dL — ABNORMAL HIGH (ref 0.50–1.10)
GFR, Est African American: 42 mL/min — ABNORMAL LOW
GFR, Est Non African American: 36 mL/min — ABNORMAL LOW
Glucose, Bld: 92 mg/dL (ref 70–99)
Potassium: 3.9 mEq/L (ref 3.5–5.3)
Sodium: 139 mEq/L (ref 135–145)

## 2013-12-04 LAB — CBC WITH DIFFERENTIAL/PLATELET
Basophils Absolute: 0.1 10*3/uL (ref 0.0–0.1)
Basophils Relative: 1 % (ref 0–1)
Eosinophils Absolute: 0.1 10*3/uL (ref 0.0–0.7)
Eosinophils Relative: 1 % (ref 0–5)
HCT: 37.5 % (ref 36.0–46.0)
Hemoglobin: 12.7 g/dL (ref 12.0–15.0)
Lymphocytes Relative: 17 % (ref 12–46)
Lymphs Abs: 1.3 10*3/uL (ref 0.7–4.0)
MCH: 29.9 pg (ref 26.0–34.0)
MCHC: 33.9 g/dL (ref 30.0–36.0)
MCV: 88.2 fL (ref 78.0–100.0)
Monocytes Absolute: 0.7 10*3/uL (ref 0.1–1.0)
Monocytes Relative: 9 % (ref 3–12)
Neutro Abs: 5.5 10*3/uL (ref 1.7–7.7)
Neutrophils Relative %: 72 % (ref 43–77)
Platelets: 148 10*3/uL — ABNORMAL LOW (ref 150–400)
RBC: 4.25 MIL/uL (ref 3.87–5.11)
RDW: 13.8 % (ref 11.5–15.5)
WBC: 7.7 10*3/uL (ref 4.0–10.5)

## 2013-12-04 NOTE — Progress Notes (Signed)
   Subjective:    Patient ID: Jillian Rodgers, female    DOB: 04-27-1945, 68 y.o.   MRN: [id number]  HPI 68 y.o. female presents for cough/congestion, has been recurrent since her cruise to [place], she has been treated with zpak, levaquin, zpak and continues to have problems. She was given Over the weekend she had dizziness occ, muscle aches, chest tightness, and epigastric pain and started to have a cough with the feeling of something in her throat. She was treated with diflucan for possible yeast esophagitis but she has stopped it, she is on protonix for GERD. She has also been having night sweats and sweats during the day, decreased appetite, and extreme fatigue. On CXR she was found ot have nodules, she has had a negative TB gold and has a CT chest scheduled for Monday. Normal EKG.    Review of Systems  Constitutional: Positive for diaphoresis, activity change, appetite change and fatigue. Negative for fever, chills and unexpected weight change.  HENT: Negative.   Eyes: Negative.   Respiratory: Positive for cough, chest tightness and shortness of breath. Negative for choking, wheezing and stridor.   Cardiovascular: Negative for chest pain, palpitations and leg swelling.  Gastrointestinal: Positive for abdominal pain. Negative for nausea, vomiting, diarrhea, constipation, blood in stool, abdominal distention, anal bleeding and rectal pain.  Genitourinary: Negative.   Musculoskeletal: Negative.   Neurological: Negative.        Objective:   Physical Exam  Constitutional: She is oriented to person, place, and time. She appears well-developed and well-nourished.  HENT:  Head: Normocephalic and atraumatic.  Right Ear: External ear normal.  Left Ear: External ear normal.  Mouth/Throat: Uvula is midline.  Eyes: Conjunctivae are normal. Pupils are equal, round, and reactive to light.  Neck: Normal range of motion. Neck supple.  Cardiovascular: Normal rate and regular rhythm.    Pulmonary/Chest: Effort normal and breath sounds normal. No respiratory distress. She has no wheezes.  Course breath sounds  Abdominal: Soft. Bowel sounds are normal. There is tenderness (epigastric and right lower quadrant). There is no rebound and no guarding.  Musculoskeletal: Normal range of motion.  Neurological: She is alert and oriented to person, place, and time.       Assessment & Plan:  SOB- normal O2, get CT scan lungs very concerning new symptoms, can refer to pulmonary if + CT or we can refer to ENT if neg, go to the ER if symptoms get worse Epigastric pain/RLQ pain- check hpylori, nexium samples given, she is uncertain if she is on the nexium, check labs

## 2013-12-04 NOTE — Patient Instructions (Signed)
Cough, Adult  A cough is a reflex that helps clear your throat and airways. It can help heal the body or may be a reaction to an irritated airway. A cough may only last 2 or 3 weeks (acute) or may last more than 8 weeks (chronic).  CAUSES Acute cough:  Viral or bacterial infections. Chronic cough:  Infections.  Allergies.  Asthma.  Post-nasal drip.  Smoking.  Heartburn or acid reflux.  Some medicines.  Chronic lung problems (COPD).  Cancer. SYMPTOMS   Cough.  Fever.  Chest pain.  Increased breathing rate.  High-pitched whistling sound when breathing (wheezing).  Colored mucus that you cough up (sputum). TREATMENT   A bacterial cough may be treated with antibiotic medicine.  A viral cough must run its course and will not respond to antibiotics.  Your caregiver may recommend other treatments if you have a chronic cough. HOME CARE INSTRUCTIONS   Only take over-the-counter or prescription medicines for pain, discomfort, or fever as directed by your caregiver. Use cough suppressants only as directed by your caregiver.  Use a cold steam vaporizer or humidifier in your bedroom or home to help loosen secretions.  Sleep in a semi-upright position if your cough is worse at night.  Rest as needed.  Stop smoking if you smoke. SEEK IMMEDIATE MEDICAL CARE IF:   You have pus in your sputum.  Your cough starts to worsen.  You cannot control your cough with suppressants and are losing sleep.  You begin coughing up blood.  You have difficulty breathing.  You develop pain which is getting worse or is uncontrolled with medicine.  You have a fever. MAKE SURE YOU:   Understand these instructions.  Will watch your condition.  Will get help right away if you are not doing well or get worse. Document Released: 09/30/2010 Document Revised: 06/26/2011 Document Reviewed: 09/30/2010 [employer] Patient Information 2015 [employer]. This information is not intended  to replace advice given to you by your health care provider. Make sure you discuss any questions you have with your health care provider.  

## 2013-12-05 ENCOUNTER — Telehealth: Payer: Self-pay | Admitting: *Deleted

## 2013-12-05 NOTE — Telephone Encounter (Signed)
I want to cancel surgery for 12/09/2013.  I'm still having health issues.  I will call back to reschedule later.  I called and canceled at the surgical center and informed Dr. Paulla Dolly.

## 2013-12-08 ENCOUNTER — Encounter: Payer: Medicare Other | Admitting: Podiatry

## 2013-12-08 ENCOUNTER — Inpatient Hospital Stay: Admission: RE | Admit: 2013-12-08 | Payer: Medicare Other | Source: Ambulatory Visit

## 2013-12-08 DIAGNOSIS — G43719 Chronic migraine without aura, intractable, without status migrainosus: Secondary | ICD-10-CM | POA: Diagnosis not present

## 2013-12-08 LAB — HELICOBACTER PYLORI ABS-IGG+IGA, BLD
H Pylori IgG: <0.4 {ISR}
HELICOBACTER PYLORI AB, IGA: 0.7 U/mL (ref <9.0)

## 2013-12-10 ENCOUNTER — Encounter: Payer: Self-pay | Admitting: Internal Medicine

## 2013-12-10 DIAGNOSIS — M542 Cervicalgia: Secondary | ICD-10-CM | POA: Diagnosis not present

## 2013-12-10 DIAGNOSIS — R51 Headache: Secondary | ICD-10-CM | POA: Diagnosis not present

## 2013-12-10 DIAGNOSIS — G518 Other disorders of facial nerve: Secondary | ICD-10-CM | POA: Diagnosis not present

## 2013-12-10 DIAGNOSIS — IMO0001 Reserved for inherently not codable concepts without codable children: Secondary | ICD-10-CM | POA: Diagnosis not present

## 2013-12-10 DIAGNOSIS — G43719 Chronic migraine without aura, intractable, without status migrainosus: Secondary | ICD-10-CM | POA: Diagnosis not present

## 2013-12-11 ENCOUNTER — Ambulatory Visit
Admission: RE | Admit: 2013-12-11 | Discharge: 2013-12-11 | Disposition: A | Discharge status: Home / Self Care | Payer: Medicare Other | Source: Ambulatory Visit | Attending: Physician Assistant | Admitting: Physician Assistant

## 2013-12-11 ENCOUNTER — Telehealth: Payer: Self-pay | Admitting: Physician Assistant

## 2013-12-11 ENCOUNTER — Other Ambulatory Visit: Payer: Self-pay | Admitting: Physician Assistant

## 2013-12-11 DIAGNOSIS — R05 Cough: Secondary | ICD-10-CM

## 2013-12-11 DIAGNOSIS — R911 Solitary pulmonary nodule: Secondary | ICD-10-CM

## 2013-12-11 DIAGNOSIS — R059 Cough, unspecified: Secondary | ICD-10-CM

## 2013-12-11 DIAGNOSIS — N289 Disorder of kidney and ureter, unspecified: Secondary | ICD-10-CM

## 2013-12-11 DIAGNOSIS — K449 Diaphragmatic hernia without obstruction or gangrene: Secondary | ICD-10-CM | POA: Diagnosis not present

## 2013-12-11 DIAGNOSIS — R079 Chest pain, unspecified: Secondary | ICD-10-CM

## 2013-12-11 DIAGNOSIS — R9389 Abnormal findings on diagnostic imaging of other specified body structures: Secondary | ICD-10-CM

## 2013-12-11 NOTE — Telephone Encounter (Signed)
68 y.o. non smoking female with history of cough after an [place] in July. She was last treated with avelox on 07/23 for congestion, after failing treatment with levaquin and zpak. She continues to have a nonproductive cough, occ dizziness, chest tightness,  Night/day sweats, decreased appetite, and extreme fatigue despite ABX therapy. She has had a negative TB gold. Her CXR showed pulmonary nodules so a CT chest was ordered which showed an ill defined right upper lobe pulmonary nodule, since she has been treated with three separate antibiotics we are going to refer her to pulmonary for evaluation. In addition there is an abnormal lesion on her kidney via CT, she has a renal U/S scheduled for 09/01, patient has been informed.   Go to the ER if any CP, SOB, nausea, dizziness.

## 2013-12-15 ENCOUNTER — Encounter: Payer: Medicare Other | Admitting: Podiatry

## 2013-12-16 ENCOUNTER — Ambulatory Visit (INDEPENDENT_AMBULATORY_CARE_PROVIDER_SITE_OTHER): Payer: Medicare Other | Admitting: Internal Medicine

## 2013-12-16 ENCOUNTER — Encounter: Payer: Self-pay | Admitting: Internal Medicine

## 2013-12-16 ENCOUNTER — Ambulatory Visit
Admission: RE | Admit: 2013-12-16 | Discharge: 2013-12-16 | Disposition: A | Discharge status: Home / Self Care | Payer: Medicare Other | Source: Ambulatory Visit | Attending: Physician Assistant | Admitting: Physician Assistant

## 2013-12-16 VITALS — BP 90/60 | HR 86 | Temp 98.7°F | Ht 63.5 in | Wt 160.0 lb

## 2013-12-16 DIAGNOSIS — R05 Cough: Secondary | ICD-10-CM | POA: Diagnosis not present

## 2013-12-16 DIAGNOSIS — R059 Cough, unspecified: Secondary | ICD-10-CM | POA: Diagnosis not present

## 2013-12-16 DIAGNOSIS — J189 Pneumonia, unspecified organism: Secondary | ICD-10-CM | POA: Diagnosis not present

## 2013-12-16 DIAGNOSIS — R058 Other specified cough: Secondary | ICD-10-CM | POA: Insufficient documentation

## 2013-12-16 DIAGNOSIS — R9389 Abnormal findings on diagnostic imaging of other specified body structures: Secondary | ICD-10-CM

## 2013-12-16 DIAGNOSIS — N289 Disorder of kidney and ureter, unspecified: Secondary | ICD-10-CM

## 2013-12-16 DIAGNOSIS — N281 Cyst of kidney, acquired: Secondary | ICD-10-CM | POA: Diagnosis not present

## 2013-12-16 MED ORDER — PREDNISONE 10 MG PO TABS: ORAL_TABLET | ORAL | Status: DC

## 2013-12-16 MED ORDER — TRAMADOL HCL 50 MG PO TABS
ORAL_TABLET | ORAL | Status: DC
Start: 2013-12-16 — End: 2013-12-29

## 2013-12-16 NOTE — Progress Notes (Signed)
Subjective:    Patient ID: Jillian Rodgers, female    DOB: May 28, 1945 MRN: [id number]  HPI  1 yowf never smoker but lots of   tendency to bronchitis in childhood and tendency to seasonal allergies on shots off x early 2000s since then able to control with otcs then went [place] in July 2015 and a week after arrival acute onset bad cough to point of chest hurting and sob saw ship's doctor with abx and inhaler helped some rx with two different abx and one round of prednisone and never better than 50% baseline so referred by Dr Lawanna Kobus' HCP Jillian Rodgers to pulmonary clinic 12/16/2013 for refractory cough.     12/16/2013 1st [facility] office visit/ [place]   Chief Complaint  Patient presents with  . Pulmonary Consult    Referred by Jillian Mutters, PA. Pt c/o cough and CP since July 2015. Cough is mainly non prod and is esp worse in the am.   prior to [place] trip was on ppi and h2 hs   Pos urinary incontinence with cough  Cough doesn't wake her up but worst in am p stirring around, no excess or purulent sputum. Only cp anterior, diffuse p coughing fit.  Sore throat resolved, still has some sense of pnds   No obvious other patterns in day to day or daytime variabilty or assoc sob   or chest tightness, subjective wheeze overt sinus or hb symptoms. No unusual exp hx or h/o childhood pna/ asthma or knowledge of premature birth.  Sleeping ok without nocturnal  or early am exacerbation  of respiratory  c/o's or need for noct saba. Also denies any obvious fluctuation of symptoms with weather or environmental changes or other aggravating or alleviating factors except as outlined above   Current Medications, Allergies, Complete Past Medical History, Past Surgical History, Family History, and Social History were reviewed in [employer] record.             Review of Systems  Constitutional: Negative for fever, chills and unexpected weight change.  HENT:  Positive for postnasal drip and sore throat. Negative for congestion, dental problem, ear pain, nosebleeds, rhinorrhea, sinus pressure, sneezing, trouble swallowing and voice change.   Eyes: Negative for visual disturbance.  Respiratory: Positive for cough. Negative for choking and shortness of breath.   Cardiovascular: Positive for chest pain. Negative for leg swelling.  Gastrointestinal: Negative for vomiting, abdominal pain and diarrhea.  Genitourinary: Negative for difficulty urinating.  Musculoskeletal: Negative for arthralgias.  Skin: Negative for rash.  Neurological: Positive for headaches. Negative for tremors and syncope.  Hematological: Does not bruise/bleed easily.       Objective:   Physical Exam  Wt Readings from Last 3 Encounters:  12/16/13 160 lb (72.576 kg)  12/04/13 156 lb (70.761 kg)  11/24/13 158 lb (71.668 kg)      HEENT: nl dentition, turbinates, and orophanx. Nl external ear canals without cough reflex   NECK :  without JVD/Nodes/TM/ nl carotid upstrokes bilaterally   LUNGS: no acc muscle use, clear to A and P bilaterally without cough on insp or exp maneuvers   CV:  RRR  no s3 or murmur or increase in P2, no edema   ABD:  soft and nontender with nl excursion in the supine position. No bruits or organomegaly, bowel sounds nl  MS:  warm without deformities, calf tenderness, cyanosis or clubbing  SKIN: warm and dry without lesions    NEURO:  alert, approp,  no deficits    cxr ? RUL Pna  11/24/13 CT chest s contrast 12/11/13 1. Bilateral patchy ill-defined pulmonary infiltrates trachea  prominent right upper lobe. These are most likely infectious. These  may have progressed from prior chest x-ray of 11/24/2013. To exclude  underlying malignancy followup nonenhanced chest CT following  treatment is suggested.  2. Small sliding hiatal hernia.       Assessment & Plan:

## 2013-12-16 NOTE — Patient Instructions (Signed)
Please see patient coordinator before you leave today  to schedule sinus ct   The key to effective treatment for your cough is eliminating the non-stop cycle of cough you're stuck in long enough to let your airway heal completely and then see if there is anything still making you cough once you stop the cough suppression, but this should take no more than 5 days to figure out  First take delsym two tsp every 12 hours and supplement if needed with  tramadol 50 mg up to 1- 2 every 4 hours to suppress the urge to cough at all or even clear your throat. Swallowing water or using ice chips/non mint and menthol containing candies (such as lifesavers or sugarless jolly ranchers) are also effective.  You should rest your voice and avoid activities that you know make you cough.  Once you have eliminated the cough for 3 straight days try reducing the tramadol first,  then the delsym as tolerated.    Prednisone 10 mg take  4 each am x 2 days,   2 each am x 2 days,  1 each am x 2 days and stop (this is to eliminate allergies and inflammation from coughing)    GERD (REFLUX)  is an extremely common cause of respiratory symptoms, many times with no significant heartburn at all.    It can be treated with medication, but also with lifestyle changes including avoidance of late meals, excessive alcohol, smoking cessation, and avoid fatty foods, chocolate, peppermint, colas, red wine, and acidic juices such as orange juice.  NO MINT OR MENTHOL PRODUCTS SO NO COUGH DROPS  USE HARD CANDY INSTEAD (jolley ranchers or Stover's or Lifesavers (all available in sugarless versions) NO OIL BASED VITAMINS - use powdered substitutes.  Please schedule a follow up office visit in 4 weeks, sooner if needed with cxr

## 2013-12-17 ENCOUNTER — Telehealth: Payer: Self-pay

## 2013-12-17 NOTE — Assessment & Plan Note (Signed)
The most common causes of chronic cough in immunocompetent adults include the following: upper airway cough syndrome (UACS), previously referred to as postnasal drip syndrome (PNDS), which is caused by variety of rhinosinus conditions; (2) asthma; (3) GERD; (4) chronic bronchitis from cigarette smoking or other inhaled environmental irritants; (5) nonasthmatic eosinophilic bronchitis; and (6) bronchiectasis.   These conditions, singly or in combination, have accounted for up to 94% of the causes of chronic cough in prospective studies.   Other conditions have constituted no >6% of the causes in prospective studies These have included bronchogenic carcinoma, chronic interstitial pneumonia, sarcoidosis, left ventricular failure, ACEI-induced cough, and aspiration from a condition associated with pharyngeal dysfunction.    Chronic cough is often simultaneously caused by more than one condition. A single cause has been found from 38 to 82% of the time, multiple causes from 18 to 62%. Multiply caused cough has been the result of three diseases up to 42% of the time.       Based on hx and exam, this is most likely:  Classic Upper airway cough syndrome, so named because it's frequently impossible to sort out how much is  CR/sinusitis with freq throat clearing (which can be related to primary GERD)   vs  causing  secondary (" extra esophageal")  GERD from wide swings in gastric pressure that occur with throat clearing, often  promoting self use of mint and menthol lozenges that reduce the lower esophageal sphincter tone and exacerbate the problem further in a cyclical fashion.   These are the same pts (now being labeled as having "irritable larynx syndrome" by some cough centers) who not infrequently have a history of having failed to tolerate ace inhibitors,  dry powder inhalers or biphosphonates or report having atypical reflux symptoms that don't respond to standard doses of PPI , and are easily confused as  having aecopd or asthma flares by even experienced allergists/ pulmonologists.   The first step is to maximize acid suppression and eliminate cyclical coughing and check sinus CT since all this started p air travel to [place] and still has sense of pnds worst in ams   See instructions for specific recommendations which were reviewed directly with the patient who was given a copy with highlighter outlining the key components.

## 2013-12-17 NOTE — Telephone Encounter (Signed)
Patient aware of Renal US results

## 2013-12-17 NOTE — Assessment & Plan Note (Signed)
Acute onset symptoms p air travel to [place] July 2015 already treated with appropriate empirical therapy  Strongly doubt the ongoing symptoms are related to the extremely wifty changes on ct chest which would not be unexpected w/in 6 weeks of a CAP and to me suggest resolving or organizing pna - since her plain film was abn on 11/24/13 it simply needs to be repeated in 4 weeks and if symptoms and cxr have normalized then no further w/u needed.

## 2013-12-18 ENCOUNTER — Encounter: Payer: Self-pay | Admitting: Internal Medicine

## 2013-12-18 ENCOUNTER — Ambulatory Visit (INDEPENDENT_AMBULATORY_CARE_PROVIDER_SITE_OTHER)
Admission: RE | Admit: 2013-12-18 | Discharge: 2013-12-18 | Disposition: A | Discharge status: Home / Self Care | Payer: Medicare Other | Source: Ambulatory Visit | Attending: Internal Medicine | Admitting: Internal Medicine

## 2013-12-18 DIAGNOSIS — R059 Cough, unspecified: Secondary | ICD-10-CM | POA: Diagnosis not present

## 2013-12-18 DIAGNOSIS — R058 Other specified cough: Secondary | ICD-10-CM

## 2013-12-18 DIAGNOSIS — R05 Cough: Secondary | ICD-10-CM | POA: Diagnosis not present

## 2013-12-18 DIAGNOSIS — J069 Acute upper respiratory infection, unspecified: Secondary | ICD-10-CM | POA: Diagnosis not present

## 2013-12-18 DIAGNOSIS — J40 Bronchitis, not specified as acute or chronic: Secondary | ICD-10-CM | POA: Diagnosis not present

## 2013-12-19 ENCOUNTER — Other Ambulatory Visit: Payer: Self-pay | Admitting: *Deleted

## 2013-12-19 MED ORDER — ESOMEPRAZOLE MAGNESIUM 40 MG PO CPDR: 40.0000 mg | DELAYED_RELEASE_CAPSULE | Freq: Every day | ORAL | Status: DC

## 2013-12-24 DIAGNOSIS — G43019 Migraine without aura, intractable, without status migrainosus: Secondary | ICD-10-CM | POA: Diagnosis not present

## 2013-12-24 DIAGNOSIS — R51 Headache: Secondary | ICD-10-CM | POA: Diagnosis not present

## 2013-12-24 DIAGNOSIS — M542 Cervicalgia: Secondary | ICD-10-CM | POA: Diagnosis not present

## 2013-12-24 DIAGNOSIS — G43719 Chronic migraine without aura, intractable, without status migrainosus: Secondary | ICD-10-CM | POA: Diagnosis not present

## 2013-12-24 DIAGNOSIS — G43119 Migraine with aura, intractable, without status migrainosus: Secondary | ICD-10-CM | POA: Diagnosis not present

## 2013-12-24 DIAGNOSIS — G518 Other disorders of facial nerve: Secondary | ICD-10-CM | POA: Diagnosis not present

## 2013-12-24 DIAGNOSIS — IMO0001 Reserved for inherently not codable concepts without codable children: Secondary | ICD-10-CM | POA: Diagnosis not present

## 2013-12-25 ENCOUNTER — Telehealth: Payer: Self-pay | Admitting: Internal Medicine

## 2013-12-25 NOTE — Telephone Encounter (Signed)
Sorry for the delay   Negative paranasal sinuses> no change recs.

## 2013-12-25 NOTE — Telephone Encounter (Signed)
lmomtcb x1 

## 2013-12-25 NOTE — Telephone Encounter (Signed)
Spoke with the pt and notified of results  She verbalized understanding  Still c/o cough and wants sooner f/u  OV set for 12/29/13

## 2013-12-25 NOTE — Telephone Encounter (Signed)
Pt had sinus CT 12/18/13. Please advise thanks

## 2013-12-29 ENCOUNTER — Ambulatory Visit (INDEPENDENT_AMBULATORY_CARE_PROVIDER_SITE_OTHER): Payer: Medicare Other

## 2013-12-29 ENCOUNTER — Ambulatory Visit (INDEPENDENT_AMBULATORY_CARE_PROVIDER_SITE_OTHER)
Admission: RE | Admit: 2013-12-29 | Discharge: 2013-12-29 | Disposition: A | Discharge status: Home / Self Care | Payer: Medicare Other | Source: Ambulatory Visit | Attending: Internal Medicine | Admitting: Internal Medicine

## 2013-12-29 ENCOUNTER — Ambulatory Visit (INDEPENDENT_AMBULATORY_CARE_PROVIDER_SITE_OTHER): Payer: Medicare Other | Admitting: Internal Medicine

## 2013-12-29 ENCOUNTER — Encounter: Payer: Self-pay | Admitting: Internal Medicine

## 2013-12-29 ENCOUNTER — Other Ambulatory Visit (INDEPENDENT_AMBULATORY_CARE_PROVIDER_SITE_OTHER): Payer: Medicare Other

## 2013-12-29 VITALS — BP 106/70 | HR 76 | Temp 98.4°F | Ht 64.0 in | Wt 161.0 lb

## 2013-12-29 VITALS — Temp 97.9°F

## 2013-12-29 DIAGNOSIS — R05 Cough: Secondary | ICD-10-CM | POA: Diagnosis not present

## 2013-12-29 DIAGNOSIS — R059 Cough, unspecified: Secondary | ICD-10-CM

## 2013-12-29 DIAGNOSIS — J189 Pneumonia, unspecified organism: Secondary | ICD-10-CM | POA: Diagnosis not present

## 2013-12-29 DIAGNOSIS — R918 Other nonspecific abnormal finding of lung field: Secondary | ICD-10-CM | POA: Diagnosis not present

## 2013-12-29 DIAGNOSIS — Z23 Encounter for immunization: Secondary | ICD-10-CM | POA: Diagnosis not present

## 2013-12-29 DIAGNOSIS — R058 Other specified cough: Secondary | ICD-10-CM

## 2013-12-29 LAB — CBC WITH DIFFERENTIAL/PLATELET
Basophils Absolute: 0.1 10*3/uL (ref 0.0–0.1)
Basophils Relative: 0.8 % (ref 0.0–3.0)
Eosinophils Absolute: 0.3 10*3/uL (ref 0.0–0.7)
Eosinophils Relative: 3.2 % (ref 0.0–5.0)
HCT: 37 % (ref 36.0–46.0)
Hemoglobin: 12.6 g/dL (ref 12.0–15.0)
Lymphocytes Relative: 28.3 % (ref 12.0–46.0)
Lymphs Abs: 2.4 10*3/uL (ref 0.7–4.0)
MCHC: 34.1 g/dL (ref 30.0–36.0)
MCV: 90.2 fl (ref 78.0–100.0)
Monocytes Absolute: 0.7 10*3/uL (ref 0.1–1.0)
Monocytes Relative: 8.2 % (ref 3.0–12.0)
Neutro Abs: 5.1 10*3/uL (ref 1.4–7.7)
Neutrophils Relative %: 59.5 % (ref 43.0–77.0)
Platelets: 114 10*3/uL — ABNORMAL LOW (ref 150.0–400.0)
RBC: 4.11 Mil/uL (ref 3.87–5.11)
RDW: 15 % (ref 11.5–15.5)
WBC: 8.6 10*3/uL (ref 4.0–10.5)

## 2013-12-29 MED ORDER — PREDNISONE 10 MG PO TABS: ORAL_TABLET | ORAL | Status: DC

## 2013-12-29 MED ORDER — TRAMADOL HCL 50 MG PO TABS: ORAL_TABLET | ORAL | Status: DC

## 2013-12-29 NOTE — Patient Instructions (Addendum)
Change the neurontin to three times a day spread out bfast, lunch and supper  Change the zyrtec to bedtime dosing   The key to effective treatment for your cough is eliminating the non-stop cycle of cough you're stuck in long enough to let your airway heal completely and then see if there is anything still making you cough once you stop the cough suppression, but this should take no more than 5 days to figure out  First take delsym two tsp every 12 hours and supplement if needed with  tramadol 50 mg up to 1- 2 every 4 hours to suppress the urge to cough at all or even clear your throat. Swallowing water or using ice chips/non mint and menthol containing candies (such as lifesavers or sugarless jolly ranchers) are also effective.  You should rest your voice and avoid activities that you know make you cough.  Once you have eliminated the cough for 3 straight days try reducing the tramadol first,  then the delsym as tolerated.    Prednisone 10 mg take  4 each am x 2 days,   2 each am x 2 days,  1 each am x 2 days and stop (this is to eliminate allergies and inflammation from coughing)    GERD (REFLUX)  is an extremely common cause of respiratory symptoms, many times with no significant heartburn at all.    It can be treated with medication, but also with lifestyle changes including avoidance of late meals, excessive alcohol, smoking cessation, and avoid fatty foods, chocolate, peppermint, colas, red wine, and acidic juices such as orange juice.  NO MINT OR MENTHOL PRODUCTS SO NO COUGH DROPS  USE HARD CANDY INSTEAD (jolley ranchers or Stover's or Lifesavers (all available in sugarless versions) NO OIL BASED VITAMINS - use powdered substitutes.  Please remember to go to the lab and x-ray department downstairs for your tests - we will call you with the results when they are available.     Keep previous appt  Late add needs esr /cxr on return

## 2013-12-29 NOTE — Progress Notes (Signed)
Subjective:    Patient ID: Jillian Rodgers, female    DOB: 13-Mar-1946 MRN: [id number]   Brief patient profile:  34 yowf never smoker but lots of   tendency to bronchitis in childhood and tendency to seasonal allergies on shots off x early 2000s since then able to control with otcs then went [place] in July 2015 and a week after arrival acute onset bad cough to point of chest hurting and sob saw ship's doctor with abx and inhaler helped some rx with two different abx and one round of prednisone and never better than 50% baseline so referred by Dr Lawanna Kobus' HCP Vicie Mutters to pulmonary clinic 12/16/2013 for refractory cough.     12/16/2013 1st [facility] office visit/ Jillian Rodgers   Chief Complaint  Patient presents with  . Pulmonary Consult    Referred by Vicie Mutters, PA. Pt c/o cough and CP since July 2015. Cough is mainly non prod and is esp worse in the am.   prior to [place] trip was on ppi and h2 hs   Pos urinary incontinence with cough  Cough doesn't wake her up but worst in am p stirring around, no excess or purulent sputum. Only cp anterior, diffuse p coughing fit.  Sore throat resolved, still has some sense of pnds  rec Cyclical cough regimen Sinus CT > neg      12/29/2013 f/u ov/[facility]ns re: cough x July 2015  Chief Complaint  Patient presents with  . Acute Visit    Pt states her cough is not improving since last visit and still has some chest tightness. She states that occ she will notice a sharp pain in her chest that comes and goes.   never suppressed the cough  (no more than one or tramadol per 24h) Still more day than night, still dry cough  Prednisone didn't help much   No obvious day to day or daytime variabilty or assoc chronic cough or cp or chest tightness, subjective wheeze overt sinus or hb symptoms. No unusual exp hx or h/o childhood pna/ asthma or knowledge of premature birth.  Sleeping ok without nocturnal  or early am exacerbation  of respiratory   c/o's or need for noct saba. Also denies any obvious fluctuation of symptoms with weather or environmental changes or other aggravating or alleviating factors except as outlined above   Current Medications, Allergies, Complete Past Medical History, Past Surgical History, Family History, and Social History were reviewed in [employer] record.  ROS  The following are not active complaints unless bolded sore throat, dysphagia, dental problems, itching, sneezing,  nasal congestion or excess/ purulent secretions, ear ache,   fever, chills, sweats, unintended wt loss, pleuritic or exertional cp, hemoptysis,  orthopnea pnd or leg swelling, presyncope, palpitations, heartburn, abdominal pain, anorexia, nausea, vomiting, diarrhea  or change in bowel or urinary habits, change in stools or urine, dysuria,hematuria,  rash, arthralgias, visual complaints, headache, numbness weakness or ataxia or problems with walking or coordination,  change in mood/affect or memory.                     Objective:   Physical Exam   12/29/2013        161  Wt Readings from Last 3 Encounters:  12/16/13 160 lb (72.576 kg)  12/04/13 156 lb (70.761 kg)  11/24/13 158 lb (71.668 kg)      HEENT: nl dentition, turbinates, and orophanx. Nl external ear canals without cough reflex  NECK :  without JVD/Nodes/TM/ nl carotid upstrokes bilaterally   LUNGS: no acc muscle use, clear to A and P bilaterally without cough on insp or exp maneuvers   CV:  RRR  no s3 or murmur or increase in P2, no edema   ABD:  soft and nontender with nl excursion in the supine position. No bruits or organomegaly, bowel sounds nl  MS:  warm without deformities, calf tenderness, cyanosis or clubbing  SKIN: warm and dry without lesions    NEURO:  alert, approp, no deficits    cxr ? RUL Pna  11/24/13 CT chest s contrast 12/11/13 1. Bilateral patchy ill-defined pulmonary infiltrates trachea  prominent right upper lobe.  These are most likely infectious. These  may have progressed from prior chest x-ray of 11/24/2013. To exclude  underlying malignancy followup nonenhanced chest CT following  treatment is suggested.  2. Small sliding hiatal hernia.   CXR  12/29/2013 :  Persistent peripheral nodular infiltrates in right upper lobe. Follow-up to resolution recommended. No pulmonary edema.     Assessment & Plan:

## 2013-12-31 ENCOUNTER — Encounter: Payer: Self-pay | Admitting: Internal Medicine

## 2013-12-31 LAB — ALLERGY FULL PROFILE
Allergen, D pternoyssinus,d7: 0.39 kU/L — ABNORMAL HIGH
Allergen,Goose feathers, e70: <0.1 kU/L
Alternaria Alternata: 0.15 kU/L — ABNORMAL HIGH
Aspergillus fumigatus, m3: <0.1 kU/L
Bahia Grass: <0.1 kU/L
Bermuda Grass: <0.1 kU/L
Box Elder IgE: <0.1 kU/L
Candida Albicans: <0.1 kU/L
Cat Dander: <0.1 kU/L
Common Ragweed: 0.11 kU/L — ABNORMAL HIGH
Curvularia lunata: <0.1 kU/L
D. farinae: 0.26 kU/L — ABNORMAL HIGH
Dog Dander: <0.1 kU/L
Elm IgE: <0.1 kU/L
Fescue: <0.1 kU/L
G005 Rye, Perennial: <0.1 kU/L
G009 Red Top: <0.1 kU/L
Goldenrod: <0.1 kU/L
Helminthosporium halodes: <0.1 kU/L
House Dust Hollister: 0.12 kU/L — ABNORMAL HIGH
IgE (Immunoglobulin E), Serum: 44 kU/L (ref <115)
Lamb's Quarters: 0.11 kU/L — ABNORMAL HIGH
Oak: 0.11 kU/L — ABNORMAL HIGH
Plantain: 0.11 kU/L — ABNORMAL HIGH
Stemphylium Botryosum: <0.1 kU/L
Sycamore Tree: 0.1 kU/L — ABNORMAL HIGH
Timothy Grass: <0.1 kU/L

## 2013-12-31 NOTE — Assessment & Plan Note (Signed)
No symptoms now of pneumonia, only dry cough so hopefully these are all residual inflammatory changes with nodular boop in the ddx - I had intended to check ESR at ov but it was not done and will f/u with cxr/ esr in 4 weeks but no further abx for now

## 2013-12-31 NOTE — Progress Notes (Signed)
Quick Note:  LMTCB ______ 

## 2013-12-31 NOTE — Assessment & Plan Note (Signed)
Note absence of eosinophilia on cbc, cough is dry /daytime typical of uacs with cyclical coughing not yet eliminated   Will repeat cyclical cough regimen then regroup to f/u on abn cxr  See instructions for specific recommendations which were reviewed directly with the patient who was given a copy with highlighter outlining the key components.

## 2014-01-01 ENCOUNTER — Telehealth: Payer: Self-pay | Admitting: Internal Medicine

## 2014-01-01 NOTE — Progress Notes (Signed)
Quick Note:  Called spoke with patient, advised of cxr results / recs as stated by MW. Pt verbalized her understanding and denied any questions. ______

## 2014-01-01 NOTE — Telephone Encounter (Signed)
Pt aware lab and cxr results from the 9.14.15 ov w/ MW. Pt will continue to follow MW's recommendations from ov and call the office if her symptoms do not improve or worsen

## 2014-01-01 NOTE — Progress Notes (Signed)
Quick Note:  Called spoke with patient, advised of lab results / recs as stated by MW. Pt verbalized her understanding and denied any questions. ______

## 2014-01-06 DIAGNOSIS — IMO0002 Reserved for concepts with insufficient information to code with codable children: Secondary | ICD-10-CM | POA: Diagnosis not present

## 2014-01-06 DIAGNOSIS — M76899 Other specified enthesopathies of unspecified lower limb, excluding foot: Secondary | ICD-10-CM | POA: Diagnosis not present

## 2014-01-07 DIAGNOSIS — G43019 Migraine without aura, intractable, without status migrainosus: Secondary | ICD-10-CM | POA: Diagnosis not present

## 2014-01-07 DIAGNOSIS — IMO0001 Reserved for inherently not codable concepts without codable children: Secondary | ICD-10-CM | POA: Diagnosis not present

## 2014-01-07 DIAGNOSIS — G43719 Chronic migraine without aura, intractable, without status migrainosus: Secondary | ICD-10-CM | POA: Diagnosis not present

## 2014-01-07 DIAGNOSIS — M542 Cervicalgia: Secondary | ICD-10-CM | POA: Diagnosis not present

## 2014-01-07 DIAGNOSIS — R51 Headache: Secondary | ICD-10-CM | POA: Diagnosis not present

## 2014-01-07 DIAGNOSIS — G518 Other disorders of facial nerve: Secondary | ICD-10-CM | POA: Diagnosis not present

## 2014-01-13 ENCOUNTER — Ambulatory Visit (INDEPENDENT_AMBULATORY_CARE_PROVIDER_SITE_OTHER): Payer: Medicare Other | Admitting: Internal Medicine

## 2014-01-13 ENCOUNTER — Ambulatory Visit: Payer: Medicare Other | Admitting: Internal Medicine

## 2014-01-13 ENCOUNTER — Ambulatory Visit (INDEPENDENT_AMBULATORY_CARE_PROVIDER_SITE_OTHER)
Admission: RE | Admit: 2014-01-13 | Discharge: 2014-01-13 | Disposition: A | Discharge status: Home / Self Care | Payer: Medicare Other | Source: Ambulatory Visit | Attending: Internal Medicine | Admitting: Internal Medicine

## 2014-01-13 ENCOUNTER — Encounter: Payer: Self-pay | Admitting: Internal Medicine

## 2014-01-13 ENCOUNTER — Other Ambulatory Visit (INDEPENDENT_AMBULATORY_CARE_PROVIDER_SITE_OTHER): Payer: Medicare Other

## 2014-01-13 VITALS — BP 100/70 | HR 99 | Temp 98.6°F | Ht 64.0 in | Wt 158.0 lb

## 2014-01-13 DIAGNOSIS — J189 Pneumonia, unspecified organism: Secondary | ICD-10-CM

## 2014-01-13 DIAGNOSIS — R058 Other specified cough: Secondary | ICD-10-CM

## 2014-01-13 DIAGNOSIS — R059 Cough, unspecified: Secondary | ICD-10-CM

## 2014-01-13 DIAGNOSIS — R05 Cough: Secondary | ICD-10-CM | POA: Diagnosis not present

## 2014-01-13 LAB — BASIC METABOLIC PANEL
BUN: 20 mg/dL (ref 6–23)
CO2: 31 mEq/L (ref 19–32)
Calcium: 9.8 mg/dL (ref 8.4–10.5)
Chloride: 101 mEq/L (ref 96–112)
Creatinine, Ser: 1.1 mg/dL (ref 0.4–1.2)
GFR: 55.08 mL/min — ABNORMAL LOW (ref >60.00)
Glucose, Bld: 76 mg/dL (ref 70–99)
Potassium: 3.3 mEq/L — ABNORMAL LOW (ref 3.5–5.1)
Sodium: 138 mEq/L (ref 135–145)

## 2014-01-13 LAB — CBC WITH DIFFERENTIAL/PLATELET
Basophils Absolute: 0 10*3/uL (ref 0.0–0.1)
Basophils Relative: 0.3 % (ref 0.0–3.0)
Eosinophils Absolute: 0.2 10*3/uL (ref 0.0–0.7)
Eosinophils Relative: 1.5 % (ref 0.0–5.0)
HCT: 39.1 % (ref 36.0–46.0)
Hemoglobin: 13.3 g/dL (ref 12.0–15.0)
Lymphocytes Relative: 21.1 % (ref 12.0–46.0)
Lymphs Abs: 2.7 10*3/uL (ref 0.7–4.0)
MCHC: 33.9 g/dL (ref 30.0–36.0)
MCV: 90.9 fl (ref 78.0–100.0)
Monocytes Absolute: 1.1 10*3/uL — ABNORMAL HIGH (ref 0.1–1.0)
Monocytes Relative: 8.6 % (ref 3.0–12.0)
Neutro Abs: 8.6 10*3/uL — ABNORMAL HIGH (ref 1.4–7.7)
Neutrophils Relative %: 68.5 % (ref 43.0–77.0)
Platelets: 137 10*3/uL — ABNORMAL LOW (ref 150.0–400.0)
RBC: 4.3 Mil/uL (ref 3.87–5.11)
RDW: 14.8 % (ref 11.5–15.5)
WBC: 12.6 10*3/uL — ABNORMAL HIGH (ref 4.0–10.5)

## 2014-01-13 LAB — SEDIMENTATION RATE: Sed Rate: 31 mm/hr — ABNORMAL HIGH (ref 0–22)

## 2014-01-13 MED ORDER — FLUTTER DEVI: Status: DC

## 2014-01-13 NOTE — Progress Notes (Signed)
Quick Note:  LMTCB ______ 

## 2014-01-13 NOTE — Patient Instructions (Signed)
Please remember to go to the lab and x-ray department downstairs for your tests - we will call you with the results when they are available.    For drainage stop zyrtec  take chlortrimeton (chlorpheniramine) 4 mg every 4 hours available over the counter (may cause drowsiness)   For cough use delsym/tramadol and use flutter valve   Please schedule a follow up office visit in 2 weeks, sooner if needed

## 2014-01-13 NOTE — Assessment & Plan Note (Signed)
Acute onset symptoms p air travel to [place] July 2015   Resolved by cxr 01/13/2014 no further f/u needed

## 2014-01-13 NOTE — Progress Notes (Signed)
Subjective:    Patient ID: [name], female    DOB: 11-24-1945 MRN: [id number]   Brief patient profile:  32 yowf never smoker but lots of   tendency to bronchitis in childhood and tendency to seasonal allergies on shots off x early 2000s since then able to control with otcs then went [place] in July 2015 and a week after arrival acute onset bad cough to point of chest hurting and sob saw ship's doctor with abx and inhaler helped some rx with two different abx and one round of prednisone and never better than 50% baseline so referred by Dr Lawanna Kobus' HCP Vicie Mutters to pulmonary clinic 12/16/2013 for refractory cough.    History of Present Illness  12/16/2013 1st [facility] office visit/ Sheridan Gettel   Chief Complaint  Patient presents with  . Pulmonary Consult    Referred by Vicie Mutters, PA. Pt c/o cough and CP since July 2015. Cough is mainly non prod and is esp worse in the am.   prior to [place] trip was on ppi and h2 hs   Pos urinary incontinence with cough  Cough doesn't wake her up but worst in am p stirring around, no excess or purulent sputum. Only cp anterior, diffuse p coughing fit.  Sore throat resolved, still has some sense of pnds  rec Cyclical cough regimen Sinus CT > neg      12/29/2013 f/u ov/Meily Glowacki re: cough x July 2015  Chief Complaint  Patient presents with  . Acute Visit    Pt states her cough is not improving since last visit and still has some chest tightness. She states that occ she will notice a sharp pain in her chest that comes and goes.   never suppressed the cough  (no more than one or tramadol per 24h) Still more day than night, still dry cough  Prednisone didn't help much  rec Change the neurontin 600  to three times a day spread out bfast, lunch and supper Change the zyrtec to bedtime dosing  Once you have eliminated the cough for 3 straight days try reducing the tramadol first,  then the delsym as tolerated.   Prednisone 10 mg take   4 each am x 2 days,   2 each am x 2 days,  1 each am x 2 days and stop (this is to eliminate allergies and inflammation from coughing) GERD diet       01/13/2014 f/u ov/Donya Tomaro re: recurrent cough  Chief Complaint  Patient presents with  . Follow-up    Pt states that her cough has improved some since the last visit.  CP has not occured since the last visit. No new co's today.  Cough completely gone for 5 days while on tramadol tapered to 2 per day then started before walked out the door am of ov but no excess mucus Was on antihistamines in some form x years  after allergy shots x 10 years which worked and didn't need allergy rx while on it. Has not tried 1st gen h1 yet as prev rec   No obvious day to day or daytime variabilty or assoc  Sob cp or chest tightness, subjective wheeze overt sinus or hb symptoms. No unusual exp hx or h/o childhood pna/ asthma or knowledge of premature birth.  Sleeping ok without nocturnal  or early am exacerbation  of respiratory  c/o's or need for noct saba. Also denies any obvious fluctuation of symptoms with weather or environmental changes or other  aggravating or alleviating factors except as outlined above   Current Medications, Allergies, Complete Past Medical History, Past Surgical History, Family History, and Social History were reviewed in [employer] record.  ROS  The following are not active complaints unless bolded sore throat, dysphagia, dental problems, itching, sneezing,  nasal congestion or excess/ purulent secretions, ear ache,   fever, chills, sweats, unintended wt loss, pleuritic or exertional cp, hemoptysis,  orthopnea pnd or leg swelling, presyncope, palpitations, heartburn, abdominal pain, anorexia, nausea, vomiting, diarrhea  or change in bowel or urinary habits, change in stools or urine, dysuria,hematuria,  rash, arthralgias, visual complaints, headache, numbness weakness or ataxia or problems with walking or coordination,   change in mood/affect or memory.                     Objective:   Physical Exam  amb healthy wf with barking cough  12/29/2013        161  Wt Readings from Last 3 Encounters:  12/16/13 160 lb (72.576 kg)  12/04/13 156 lb (70.761 kg)  11/24/13 158 lb (71.668 kg)      HEENT: nl dentition, turbinates, and orophanx. Nl external ear canals without cough reflex   NECK :  without JVD/Nodes/TM/ nl carotid upstrokes bilaterally   LUNGS: no acc muscle use, clear to A and P bilaterally without cough on insp or exp maneuvers   CV:  RRR  no s3 or murmur or increase in P2, no edema   ABD:  soft and nontender with nl excursion in the supine position. No bruits or organomegaly, bowel sounds nl  MS:  warm without deformities, calf tenderness, cyanosis or clubbing  SKIN: warm and dry without lesions    NEURO:  alert, approp, no deficits    cxr ? RUL Pna  11/24/13 CT chest s contrast 12/11/13 1. Bilateral patchy ill-defined pulmonary infiltrates trachea  prominent right upper lobe. These are most likely infectious. These  may have progressed from prior chest x-ray of 11/24/2013. To exclude  underlying malignancy followup nonenhanced chest CT following  treatment is suggested.  2. Small sliding hiatal hernia.   CXR  12/29/2013 :  Persistent peripheral nodular infiltrates in right upper lobe. Follow-up to resolution recommended. No pulmonary edema.  CXR  01/13/2014 : Resolving peripheral right lung nodular opacities. No current acute  process.  Remote granulomatous disease.      Lab Results  Component Value Date   ESRSEDRATE 31* 01/13/2014     Recent Labs Lab 01/13/14 1207  NA 138  K 3.3*  CL 101  CO2 31  BUN 20  CREATININE 1.1  GLUCOSE 76    Recent Labs Lab 01/13/14 1207  HGB 13.3  HCT 39.1  WBC 12.6*  PLT 137.0*           Assessment & Plan:

## 2014-01-13 NOTE — Assessment & Plan Note (Addendum)
-   Sinus ct  12/18/2013 > Negative paranasal sinuses - allery profile 12/29/2013 > no Eos  Ig E 44 mild dust and mold, otherwise neg RAST - Add flutter valve 01/13/2014 >>>  The most common causes of chronic cough in immunocompetent adults include the following: upper airway cough syndrome (UACS), previously referred to as postnasal drip syndrome (PNDS), which is caused by variety of rhinosinus conditions; (2) asthma; (3) GERD; (4) chronic bronchitis from cigarette smoking or other inhaled environmental irritants; (5) nonasthmatic eosinophilic bronchitis; and (6) bronchiectasis.   These conditions, singly or in combination, have accounted for up to 94% of the causes of chronic cough in prospective studies.   Other conditions have constituted no >6% of the causes in prospective studies These have included bronchogenic carcinoma, chronic interstitial pneumonia, sarcoidosis, left ventricular failure, ACEI-induced cough, and aspiration from a condition associated with pharyngeal dysfunction.    Chronic cough is often simultaneously caused by more than one condition. A single cause has been found from 38 to 82% of the time, multiple causes from 18 to 62%. Multiply caused cough has been the result of three diseases up to 42% of the time.       Based on hx and exam, this is most likely:  Classic Upper airway cough syndrome, so named because it's frequently impossible to sort out how much is  CR/sinusitis with freq throat clearing (which can be related to primary GERD)   vs  causing  secondary (" extra esophageal")  GERD from wide swings in gastric pressure that occur with throat clearing, often  promoting self use of mint and menthol lozenges that reduce the lower esophageal sphincter tone and exacerbate the problem further in a cyclical fashion.   These are the same pts (now being labeled as having "irritable larynx syndrome" by some cough centers) who not infrequently have a history of having failed to  tolerate ace inhibitors,  dry powder inhalers or biphosphonates or report having atypical reflux symptoms that don't respond to standard doses of PPI , and are easily confused as having aecopd or asthma flares by even experienced allergists/ pulmonologists.   The first step is to maximize acid suppression and eliminate cyclical coughing with flutter valve  then regroup in 2 weeks  See instructions for specific recommendations which were reviewed directly with the patient who was given a copy with highlighter outlining the key components.

## 2014-01-14 ENCOUNTER — Telehealth: Payer: Self-pay | Admitting: Internal Medicine

## 2014-01-14 NOTE — Telephone Encounter (Signed)
Called and spoke with the pt and notified her of cxr and lab results  She verbalized understanding and nothing further needed

## 2014-01-14 NOTE — Progress Notes (Signed)
Quick Note:  Spoke with pt and notified of results per Dr. Wert. Pt verbalized understanding and denied any questions.  ______ 

## 2014-01-19 ENCOUNTER — Ambulatory Visit (INDEPENDENT_AMBULATORY_CARE_PROVIDER_SITE_OTHER): Payer: Medicare Other | Admitting: Internal Medicine

## 2014-01-19 ENCOUNTER — Encounter: Payer: Self-pay | Admitting: Internal Medicine

## 2014-01-19 VITALS — BP 102/66 | HR 80 | Temp 98.6°F | Resp 16 | Ht 63.75 in | Wt 157.8 lb

## 2014-01-19 DIAGNOSIS — R7303 Prediabetes: Secondary | ICD-10-CM

## 2014-01-19 DIAGNOSIS — R7309 Other abnormal glucose: Secondary | ICD-10-CM

## 2014-01-19 DIAGNOSIS — I10 Essential (primary) hypertension: Secondary | ICD-10-CM

## 2014-01-19 DIAGNOSIS — E559 Vitamin D deficiency, unspecified: Secondary | ICD-10-CM

## 2014-01-19 DIAGNOSIS — R6889 Other general symptoms and signs: Secondary | ICD-10-CM

## 2014-01-19 DIAGNOSIS — N183 Chronic kidney disease, stage 3 unspecified: Secondary | ICD-10-CM | POA: Insufficient documentation

## 2014-01-19 DIAGNOSIS — Z1331 Encounter for screening for depression: Secondary | ICD-10-CM

## 2014-01-19 DIAGNOSIS — E785 Hyperlipidemia, unspecified: Secondary | ICD-10-CM | POA: Diagnosis not present

## 2014-01-19 DIAGNOSIS — Z789 Other specified health status: Secondary | ICD-10-CM

## 2014-01-19 DIAGNOSIS — Z79899 Other long term (current) drug therapy: Secondary | ICD-10-CM | POA: Diagnosis not present

## 2014-01-19 DIAGNOSIS — Z9181 History of falling: Secondary | ICD-10-CM

## 2014-01-19 DIAGNOSIS — Z1212 Encounter for screening for malignant neoplasm of rectum: Secondary | ICD-10-CM | POA: Diagnosis not present

## 2014-01-19 DIAGNOSIS — Z0001 Encounter for general adult medical examination with abnormal findings: Secondary | ICD-10-CM

## 2014-01-19 DIAGNOSIS — K219 Gastro-esophageal reflux disease without esophagitis: Secondary | ICD-10-CM

## 2014-01-19 DIAGNOSIS — N1831 Chronic kidney disease, stage 3a: Secondary | ICD-10-CM | POA: Insufficient documentation

## 2014-01-19 DIAGNOSIS — Z1389 Encounter for screening for other disorder: Secondary | ICD-10-CM | POA: Diagnosis not present

## 2014-01-19 DIAGNOSIS — E039 Hypothyroidism, unspecified: Secondary | ICD-10-CM | POA: Diagnosis not present

## 2014-01-19 DIAGNOSIS — Z1289 Encounter for screening for malignant neoplasm of other sites: Secondary | ICD-10-CM | POA: Diagnosis not present

## 2014-01-19 DIAGNOSIS — Z1231 Encounter for screening mammogram for malignant neoplasm of breast: Secondary | ICD-10-CM | POA: Diagnosis not present

## 2014-01-19 LAB — CBC WITH DIFFERENTIAL/PLATELET
Basophils Absolute: 0 10*3/uL (ref 0.0–0.1)
Basophils Relative: 0 % (ref 0–1)
Eosinophils Absolute: 0.1 10*3/uL (ref 0.0–0.7)
Eosinophils Relative: 1 % (ref 0–5)
HCT: 38.8 % (ref 36.0–46.0)
Hemoglobin: 13.3 g/dL (ref 12.0–15.0)
Lymphocytes Relative: 25 % (ref 12–46)
Lymphs Abs: 2.3 10*3/uL (ref 0.7–4.0)
MCH: 30.5 pg (ref 26.0–34.0)
MCHC: 34.3 g/dL (ref 30.0–36.0)
MCV: 89 fL (ref 78.0–100.0)
Monocytes Absolute: 0.6 10*3/uL (ref 0.1–1.0)
Monocytes Relative: 7 % (ref 3–12)
Neutro Abs: 6.1 10*3/uL (ref 1.7–7.7)
Neutrophils Relative %: 67 % (ref 43–77)
Platelets: 132 10*3/uL — ABNORMAL LOW (ref 150–400)
RBC: 4.36 MIL/uL (ref 3.87–5.11)
RDW: 14.6 % (ref 11.5–15.5)
WBC: 9.1 10*3/uL (ref 4.0–10.5)

## 2014-01-19 NOTE — Patient Instructions (Signed)
Recommend the book "The END of DIETING" by Dr Baker Janus   and the book "The END of DIABETES " by Dr Excell Seltzer  At [email or web address] - get book & Audio CD's      Being diabetic has a  300% increased risk for heart attack, stroke, cancer, and alzheimer- type vascular dementia. It is very important that you work harder with diet by avoiding all foods that are white except chicken & fish. Avoid white rice (brown & wild rice is OK), white potatoes (sweetpotatoes in moderation is OK), White bread or wheat bread or anything made out of white flour like bagels, donuts, rolls, buns, biscuits, cakes, pastries, cookies, pizza crust, and pasta (made from white flour & egg whites) - vegetarian pasta or spinach or wheat pasta is OK. Multigrain breads like Arnold's or [employer], or multigrain sandwich thins or flatbreads.  Diet, exercise and weight loss can reverse and cure diabetes in the early stages.  Diet, exercise and weight loss is very important in the control and prevention of complications of diabetes which affects every system in your body, ie. Brain - dementia/stroke, eyes - glaucoma/blindness, heart - heart attack/heart failure, kidneys - dialysis, stomach - gastric paralysis, intestines - malabsorption, nerves - severe painful neuritis, circulation - gangrene & loss of a leg(s), and finally cancer and Alzheimers.    I recommend avoid fried & greasy foods,  sweets/candy, white rice (brown or wild rice or Quinoa is OK), white potatoes (sweet potatoes are OK) - anything made from white flour - bagels, doughnuts, rolls, buns, biscuits,white and wheat breads, pizza crust and traditional pasta made of white flour & egg white(vegetarian pasta or spinach or wheat pasta is OK).  Multi-grain bread is OK - like multi-grain flat bread or sandwich thins. Avoid alcohol in excess. Exercise is also important.    Eat all the vegetables you want - avoid meat, especially red meat and dairy - especially cheese.  Cheese  is the most concentrated form of trans-fats which is the worst thing to clog up our arteries. Veggie cheese is OK which can be found in the fresh produce section at [employer] or [employer] or [employer] Adopting a healthy lifestyle and getting preventive care can go a long way to promote health and wellness. Talk with your health care provider about what schedule of regular examinations is right for you. This is a good chance for you to check in with your provider about disease prevention and staying healthy. In between checkups, there are plenty of things you can do on your own. Experts have done a lot of research about which lifestyle changes and preventive measures are most likely to keep you healthy. Ask your health care provider for more information. WEIGHT AND DIET  Eat a healthy diet  Be sure to include plenty of vegetables, fruits, low-fat dairy products, and lean protein.  Do not eat a lot of foods high in solid fats, added sugars, or salt.  Get regular exercise. This is one of the most important things you can do for your health.  Most adults should exercise for at least 150 minutes each week. The exercise should increase your heart rate and make you sweat (moderate-intensity exercise).  Most adults should also do strengthening exercises at least twice a week. This is in addition to the moderate-intensity exercise.  Maintain a healthy weight  Body mass index (BMI) is a measurement that can be used to identify possible weight problems. It estimates  body fat based on height and weight. Your health care provider can help determine your BMI and help you achieve or maintain a healthy weight.  For females 17 years of age and older:   A BMI below 18.5 is considered underweight.  A BMI of 18.5 to 24.9 is normal.  A BMI of 25 to 29.9 is considered overweight.  A BMI of 30 and above is considered obese.  Watch levels of cholesterol and blood lipids  You  should start having your blood tested for lipids and cholesterol at 70 years of age, then have this test every 5 years.  You may need to have your cholesterol levels checked more often if:  Your lipid or cholesterol levels are high.  You are older than 70 years of age.  You are at high risk for heart disease.  CANCER SCREENING   Lung Cancer  Lung cancer screening is recommended for adults 14-15 years old who are at high risk for lung cancer because of a history of smoking.  A yearly low-dose CT scan of the lungs is recommended for people who:  Currently smoke.  Have quit within the past 15 years.  Have at least a 30-pack-year history of smoking. A pack year is smoking an average of one pack of cigarettes a day for 1 year.  Yearly screening should continue until it has been 15 years since you quit.  Yearly screening should stop if you develop a health problem that would prevent you from having lung cancer treatment.  Breast Cancer  Practice breast self-awareness. This means understanding how your breasts normally appear and feel.  It also means doing regular breast self-exams. Let your health care provider know about any changes, no matter how small.  If you are in your 20s or 30s, you should have a clinical breast exam (CBE) by a health care provider every 1-3 years as part of a regular health exam.  If you are 32 or older, have a CBE every year. Also consider having a breast X-ray (mammogram) every year.  If you have a family history of breast cancer, talk to your health care provider about genetic screening.  If you are at high risk for breast cancer, talk to your health care provider about having an MRI and a mammogram every year.  Breast cancer gene (BRCA) assessment is recommended for women who have family members with BRCA-related cancers. BRCA-related cancers include:  Breast.  Ovarian.  Tubal.  Peritoneal cancers.  Results of the assessment will determine  the need for genetic counseling and BRCA1 and BRCA2 testing. Cervical Cancer Routine pelvic examinations to screen for cervical cancer are no longer recommended for nonpregnant women who are considered low risk for cancer of the pelvic organs (ovaries, uterus, and vagina) and who do not have symptoms. A pelvic examination may be necessary if you have symptoms including those associated with pelvic infections. Ask your health care provider if a screening pelvic exam is right for you.   The Pap test is the screening test for cervical cancer for women who are considered at risk.  If you had a hysterectomy for a problem that was not cancer or a condition that could lead to cancer, then you no longer need Pap tests.  If you are older than 65 years, and you have had normal Pap tests for the past 10 years, you no longer need to have Pap tests.  If you have had past treatment for cervical cancer or a condition that  could lead to cancer, you need Pap tests and screening for cancer for at least 20 years after your treatment.  If you no longer get a Pap test, assess your risk factors if they change (such as having a new sexual partner). This can affect whether you should start being screened again.  Some women have medical problems that increase their chance of getting cervical cancer. If this is the case for you, your health care provider may recommend more frequent screening and Pap tests.  The human papillomavirus (HPV) test is another test that may be used for cervical cancer screening. The HPV test looks for the virus that can cause cell changes in the cervix. The cells collected during the Pap test can be tested for HPV.  The HPV test can be used to screen women 17 years of age and older. Getting tested for HPV can extend the interval between normal Pap tests from three to five years.  An HPV test also should be used to screen women of any age who have unclear Pap test results.  After 70 years of  age, women should have HPV testing as often as Pap tests.  Colorectal Cancer  This type of cancer can be detected and often prevented.  Routine colorectal cancer screening usually begins at 70 years of age and continues through 70 years of age.  Your health care provider may recommend screening at an earlier age if you have risk factors for colon cancer.  Your health care provider may also recommend using home test kits to check for hidden blood in the stool.  A small camera at the end of a tube can be used to examine your colon directly (sigmoidoscopy or colonoscopy). This is done to check for the earliest forms of colorectal cancer.  Routine screening usually begins at age 97.  Direct examination of the colon should be repeated every 5-10 years through 70 years of age. However, you may need to be screened more often if early forms of precancerous polyps or small growths are found. Skin Cancer  Check your skin from head to toe regularly.  Tell your health care provider about any new moles or changes in moles, especially if there is a change in a mole's shape or color.  Also tell your health care provider if you have a mole that is larger than the size of a pencil eraser.  Always use sunscreen. Apply sunscreen liberally and repeatedly throughout the day.  Protect yourself by wearing long sleeves, pants, a wide-brimmed hat, and sunglasses whenever you are outside. HEART DISEASE, DIABETES, AND HIGH BLOOD PRESSURE   Have your blood pressure checked at least every 1-2 years. High blood pressure causes heart disease and increases the risk of stroke.  If you are between 77 years and 67 years old, ask your health care provider if you should take aspirin to prevent strokes.  Have regular diabetes screenings. This involves taking a blood sample to check your fasting blood sugar level.  If you are at a normal weight and have a low risk for diabetes, have this test once every three years  after 70 years of age.  If you are overweight and have a high risk for diabetes, consider being tested at a younger age or more often. PREVENTING INFECTION  Hepatitis B  If you have a higher risk for hepatitis B, you should be screened for this virus. You are considered at high risk for hepatitis B if:  You were born in a  country where hepatitis B is common. Ask your health care provider which countries are considered high risk.  Your parents were born in a high-risk country, and you have not been immunized against hepatitis B (hepatitis B vaccine).  You have HIV or AIDS.  You use needles to inject street drugs.  You live with someone who has hepatitis B.  You have had sex with someone who has hepatitis B.  You get hemodialysis treatment.  You take certain medicines for conditions, including cancer, organ transplantation, and autoimmune conditions. Hepatitis C  Blood testing is recommended for:  Everyone born from [birth year] through 1967.  Anyone with known risk factors for hepatitis C. OSTEOPOROSIS AND MENOPAUSE   Osteoporosis is a disease in which the bones lose minerals and strength with aging. This can result in serious bone fractures. Your risk for osteoporosis can be identified using a bone density scan.  If you are 20 years of age or older, or if you are at risk for osteoporosis and fractures, ask your health care provider if you should be screened.  Ask your health care provider whether you should take a calcium or vitamin D supplement to lower your risk for osteoporosis.  Menopause may have certain physical symptoms and risks.  Hormone replacement therapy may reduce some of these symptoms and risks.   HOME CARE INSTRUCTIONS   Schedule regular health, dental, and eye exams.  Stay current with your immunizations.   Do not use any tobacco products including cigarettes, chewing tobacco, or electronic cigarettes.  If you are pregnant, do not drink alcohol.  If you  are breastfeeding, limit how much and how often you drink alcohol.  Limit alcohol intake to no more than 1 drink per day for nonpregnant women. One drink equals 12 ounces of beer, 5 ounces of wine, or 1 ounces of hard liquor.  Do not use street drugs.  Do not share needles.  Ask your health care provider for help if you need support or information about quitting drugs.  Tell your health care provider if you often feel depressed.  Tell your health care provider if you have ever been abused or do not feel safe at home.

## 2014-01-19 NOTE — Progress Notes (Signed)
Patient ID: Jillian Rodgers, female   DOB: [birth year], 68 y.o.   MRN: [id number]  Annual Screening & Preventative Examination  This very nice 68 y.o.MWF presents for complete physical.  Patient has been followed for HTN,  Prediabetes, Hyperlipidemia, Hypothyroidism and Vitamin D Deficiency.    HTN predates since the 1990's. Patient's BP has been controlled at home and patient denies any cardiac symptoms as chest pain, palpitations, shortness of breath, dizziness or ankle swelling. Today's BP: 102/66 mmHg.    Patient's hyperlipidemia is controlled with diet and medications. Patient denies myalgias or other medication SE's. Last lipids were Total Chol 153; HDL  60; LDL 60; Trig 163* at goal  on 09/04/2013.   Patient has prediabetes predating since Aug 2012 (A1c 6.0%) and patient denies reactive hypoglycemic symptoms, visual blurring, diabetic polys, or paresthesias. Last A1c was 5.8% on 09/04/2013.   Finally, patient has history of Vitamin D Deficiency  (39 in 2012) and last Vitamin D was  101 on 05/21/2013.  Medication Sig  . VITAMIN C 500 MG  Take 1 capsule by mouth daily.  . Cetirizine  10 MG CAPS Take 10 mg by mouth daily.  [name] Kitchen esomeprazole 40 MG capsule Take 40 mg by mouth daily before breakfast.  . gabapentin 600 MG tablet Take 600 mg by mouth 3 (three) times daily.  . hctz 25 MG tablet Take 25 mg by mouth daily.  . Magnesium 250 MG TABS Take 1 tablet by mouth daily.  . meloxicam  15 MG tablet Take 1 tablet (15 mg total) by mouth daily. For pain and inflammation  . pravastatin  40 MG tablet Take 40 mg by mouth daily.  . ranitidine  300 MG tablet Take 1 tablet (300 mg total) by mouth at bedtime. For acid indigestion and reflux  . RespTher Supplies (FLUTTER) I Use as directed  . l-Thyroxine 50 MCG tablet take 1 tablet by mouth once daily BEFORE BREAKFAST  . traMADol  50 MG tablet 1-2 every 4 hours as needed for cough or pain  . VITAMIN D Take 5,000 Int'l Units/day by mouth daily.    No Known Allergies  Past Medical History  Diagnosis Date  . Hypertension   . Pre-diabetes   . Degenerative joint disease   . GERD (gastroesophageal reflux disease)   . Hyperlipidemia   . Thyroid disease   . Migraines     neurontin helps  . Diabetes mellitus without complication    Past Surgical History  Procedure Laterality Date  . Ventral hernia repair    . Cholecystectomy  1996    open  . Pilonidal cyst excision  1972   Family History  Problem Relation Age of Onset  . Ovarian cancer Mother   . Breast cancer Sister   . Thyroid cancer Sister   . Stroke Paternal Uncle     History  Substance Use Topics  . Smoking status: Never Smoker   . Smokeless tobacco: Never Used  . Alcohol Use: No    ROS Constitutional: Denies fever, chills, weight loss/gain, headaches, insomnia, fatigue, night sweats, and change in appetite. Eyes: Denies redness, blurred vision, diplopia, discharge, itchy, watery eyes.  ENT: Denies discharge, congestion, post nasal drip, epistaxis, sore throat, earache, hearing loss, dental pain, Tinnitus, Vertigo, Sinus pain, snoring.  Cardio: Denies chest pain, palpitations, irregular heartbeat, syncope, dyspnea, diaphoresis, orthopnea, PND, claudication, edema Respiratory: denies cough, dyspnea, DOE, pleurisy, hoarseness, laryngitis, wheezing.  Gastrointestinal: Denies dysphagia, heartburn, reflux, water brash, pain, cramps, nausea, vomiting, bloating, diarrhea,  constipation, hematemesis, melena, hematochezia, jaundice, hemorrhoids Genitourinary: Denies dysuria, frequency, urgency, nocturia, hesitancy, discharge, hematuria, flank pain Breast: Breast lumps, nipple discharge, bleeding.  Musculoskeletal: Denies arthralgia, myalgia, stiffness, Jt. Swelling, pain, limp, and strain/sprain. Denies falls. Skin: Denies puritis, rash, hives, warts, acne, eczema, changing in skin lesion Neuro: No weakness, tremor, incoordination, spasms, paresthesia, pain Psychiatric:  Denies confusion, memory loss, sensory loss. Denies Depression. Endocrine: Denies change in weight, skin, hair change, nocturia, and paresthesia, diabetic polys, visual blurring, hyper / hypo glycemic episodes.  Heme/Lymph: No excessive bleeding, bruising, enlarged lymph nodes.  Physical Exam  BP 102/66  Pulse 80  Temp(Src) 98.6 F (37 C) (Temporal)  Resp 16  Ht 5' 3.75" (1.619 m)  Wt 157 lb 12.8 oz (71.578 kg)  BMI 27.31 kg/m2  General Appearance: Well nourished and in no apparent distress. Eyes: PERRLA, EOMs, conjunctiva no swelling or erythema, normal fundi and vessels. Sinuses: No frontal/maxillary tenderness ENT/Mouth: EACs patent / TMs  nl. Nares clear without erythema, swelling, mucoid exudates. Oral hygiene is good. No erythema, swelling, or exudate. Tongue normal, non-obstructing. Tonsils not swollen or erythematous. Hearing normal.  Neck: Supple, thyroid normal. No bruits, nodes or JVD. Respiratory: Respiratory effort normal.  BS equal and clear bilateral without rales, rhonci, wheezing or stridor. Cardio: Heart sounds are normal with regular rate and rhythm and no murmurs, rubs or gallops. Peripheral pulses are normal and equal bilaterally without edema. No aortic or femoral bruits. Chest: symmetric with normal excursions and percussion. Breasts: Symmetric, without lumps, nipple discharge, retractions, or fibrocystic changes.  Abdomen: Flat, soft, with bowl sounds. Nontender, no guarding, rebound, hernias, masses, or organomegaly.  Lymphatics: Non tender without lymphadenopathy.  Musculoskeletal: Full ROM all peripheral extremities, joint stability, 5/5 strength, and normal gait. Skin: Warm and dry without rashes, lesions, cyanosis, clubbing or  ecchymosis.  Neuro: Cranial nerves intact, reflexes equal bilaterally. Normal muscle tone, no cerebellar symptoms. Sensation intact.  Pysch: Awake and oriented X 3, normal affect, Insight and Judgment appropriate.  Assessment and  Plan  1. Annual Screening Examination 2. Hypertension  3. Hyperlipidemia 4. Pre Diabetes 5. Vitamin D Deficiency   Continue prudent diet as discussed, weight control, BP monitoring, regular exercise, and medications. Discussed med's effects and SE's. Screening labs and tests as requested with regular follow-up as recommended.

## 2014-01-20 ENCOUNTER — Other Ambulatory Visit: Payer: Self-pay | Admitting: Orthopedic Surgery

## 2014-01-20 ENCOUNTER — Other Ambulatory Visit: Payer: Self-pay | Admitting: Obstetrics and Gynecology

## 2014-01-20 DIAGNOSIS — E2839 Other primary ovarian failure: Secondary | ICD-10-CM

## 2014-01-20 DIAGNOSIS — M25561 Pain in right knee: Secondary | ICD-10-CM | POA: Diagnosis not present

## 2014-01-20 DIAGNOSIS — M23206 Derangement of unspecified meniscus due to old tear or injury, right knee: Secondary | ICD-10-CM | POA: Diagnosis not present

## 2014-01-20 LAB — URINALYSIS, MICROSCOPIC ONLY: Bacteria, UA: NONE SEEN

## 2014-01-20 LAB — BASIC METABOLIC PANEL WITH GFR
BUN: 20 mg/dL (ref 6–23)
CO2: 29 mEq/L (ref 19–32)
Calcium: 9.8 mg/dL (ref 8.4–10.5)
Chloride: 102 mEq/L (ref 96–112)
Creat: 1.11 mg/dL — ABNORMAL HIGH (ref 0.50–1.10)
GFR, Est African American: 59 mL/min — ABNORMAL LOW
GFR, Est Non African American: 51 mL/min — ABNORMAL LOW
Glucose, Bld: 79 mg/dL (ref 70–99)
Potassium: 3.9 mEq/L (ref 3.5–5.3)
Sodium: 142 mEq/L (ref 135–145)

## 2014-01-20 LAB — LIPID PANEL
Cholesterol: 181 mg/dL (ref 0–200)
HDL: 73 mg/dL (ref >39)
LDL Cholesterol: 82 mg/dL (ref 0–99)
Total CHOL/HDL Ratio: 2.5 Ratio
Triglycerides: 132 mg/dL (ref <150)
VLDL: 26 mg/dL (ref 0–40)

## 2014-01-20 LAB — HEPATIC FUNCTION PANEL
ALT: 11 U/L (ref 0–35)
AST: 16 U/L (ref 0–37)
Albumin: 4.1 g/dL (ref 3.5–5.2)
Alkaline Phosphatase: 64 U/L (ref 39–117)
Bilirubin, Direct: 0.1 mg/dL (ref 0.0–0.3)
Indirect Bilirubin: 0.6 mg/dL (ref 0.2–1.2)
Total Bilirubin: 0.7 mg/dL (ref 0.2–1.2)
Total Protein: 6.6 g/dL (ref 6.0–8.3)

## 2014-01-20 LAB — TSH: TSH: 1.253 u[IU]/mL (ref 0.350–4.500)

## 2014-01-20 LAB — MAGNESIUM: Magnesium: 1.9 mg/dL (ref 1.5–2.5)

## 2014-01-20 LAB — INSULIN, FASTING: Insulin fasting, serum: 7 u[IU]/mL (ref 2.0–19.6)

## 2014-01-20 LAB — MICROALBUMIN / CREATININE URINE RATIO
Creatinine, Urine: 263.2 mg/dL
Microalb Creat Ratio: 33.4 mg/g — ABNORMAL HIGH (ref 0.0–30.0)
Microalb, Ur: 8.8 mg/dL — ABNORMAL HIGH (ref <2.0)

## 2014-01-20 LAB — HEMOGLOBIN A1C
Hgb A1c MFr Bld: 5.9 % — ABNORMAL HIGH (ref <5.7)
Mean Plasma Glucose: 123 mg/dL — ABNORMAL HIGH (ref <117)

## 2014-01-20 LAB — VITAMIN D 25 HYDROXY (VIT D DEFICIENCY, FRACTURES): Vit D, 25-Hydroxy: 103 ng/mL — ABNORMAL HIGH (ref 30–89)

## 2014-01-22 DIAGNOSIS — G43019 Migraine without aura, intractable, without status migrainosus: Secondary | ICD-10-CM | POA: Diagnosis not present

## 2014-01-22 DIAGNOSIS — M5481 Occipital neuralgia: Secondary | ICD-10-CM | POA: Diagnosis not present

## 2014-01-22 DIAGNOSIS — R51 Headache: Secondary | ICD-10-CM | POA: Diagnosis not present

## 2014-01-22 DIAGNOSIS — G43719 Chronic migraine without aura, intractable, without status migrainosus: Secondary | ICD-10-CM | POA: Diagnosis not present

## 2014-01-22 DIAGNOSIS — G43109 Migraine with aura, not intractable, without status migrainosus: Secondary | ICD-10-CM | POA: Diagnosis not present

## 2014-01-22 DIAGNOSIS — M542 Cervicalgia: Secondary | ICD-10-CM | POA: Diagnosis not present

## 2014-01-22 DIAGNOSIS — G43111 Migraine with aura, intractable, with status migrainosus: Secondary | ICD-10-CM | POA: Diagnosis not present

## 2014-01-22 DIAGNOSIS — M791 Myalgia: Secondary | ICD-10-CM | POA: Diagnosis not present

## 2014-01-23 ENCOUNTER — Encounter: Payer: Self-pay | Admitting: Internal Medicine

## 2014-01-23 ENCOUNTER — Ambulatory Visit (INDEPENDENT_AMBULATORY_CARE_PROVIDER_SITE_OTHER): Payer: Medicare Other | Admitting: Internal Medicine

## 2014-01-23 VITALS — BP 132/84 | HR 73 | Temp 99.0°F | Ht 63.5 in | Wt 158.2 lb

## 2014-01-23 DIAGNOSIS — J189 Pneumonia, unspecified organism: Secondary | ICD-10-CM | POA: Diagnosis not present

## 2014-01-23 DIAGNOSIS — R05 Cough: Secondary | ICD-10-CM | POA: Diagnosis not present

## 2014-01-23 DIAGNOSIS — R058 Other specified cough: Secondary | ICD-10-CM

## 2014-01-23 NOTE — Patient Instructions (Addendum)
For drainage/ tickle/ itching/ sneezing either use  Zyrtec 10 mg dailly or   take chlortrimeton (chlorpheniramine) 4 mg every 4 hours available over the counter (may cause drowsiness)   For cough use delsym only as needed    If you are satisfied with your treatment plan,  let your doctor know and he/she can either refill your medications or you can return here when your prescription runs out.     If in any way you are not 100% satisfied,  please tell us.  If 100% better, tell your friends!  Pulmonary follow up is as needed

## 2014-01-23 NOTE — Progress Notes (Signed)
Subjective:    Patient ID: Jillian Rodgers, female    DOB: 17-Jan-1946 MRN: [id number]   Brief patient profile:  68 yowf never smoker but lots of   tendency to bronchitis in childhood and tendency to seasonal allergies on shots off x early 2000s since then able to control with otcs then went [place] in July 2015 and a week after arrival acute onset bad cough to point of chest hurting and sob saw ship's doctor with abx and inhaler helped some rx with two different abx and one round of prednisone and never better than 50% baseline so referred by Dr Lawanna Kobus' HCP Vicie Mutters to pulmonary clinic 12/16/2013 for refractory cough.    History of Present Illness  12/16/2013 [facility] office visit/ [facility]   Chief Complaint  Patient presents with  . Pulmonary Consult    Referred by Vicie Mutters, PA. Pt c/o cough and CP since July 2015. Cough is mainly non prod and is esp worse in the am.   prior to [place] trip was on ppi and h2 hs   Pos urinary incontinence with cough  Cough doesn't wake her up but worst in am p stirring around, no excess or purulent sputum. Only cp anterior, diffuse p coughing fit.  Sore throat resolved, still has some sense of pnds  rec Cyclical cough regimen Sinus CT > neg      12/29/2013 f/u ov/[facility] re: cough x July 2015  Chief Complaint  Patient presents with  . Acute Visit    Pt states her cough is not improving since last visit and still has some chest tightness. She states that occ she will notice a sharp pain in her chest that comes and goes.   never suppressed the cough  (no more than one or tramadol per 24h) Still more day than night, still dry cough  Prednisone didn't help much  rec Change the neurontin 600  to three times a day spread out bfast, lunch and supper Change the zyrtec to bedtime dosing  Once you have eliminated the cough for 3 straight days try reducing the tramadol first,  then the delsym as tolerated.   Prednisone 10 mg take   4 each am x 2 days,   2 each am x 2 days,  1 each am x 2 days and stop (this is to eliminate allergies and inflammation from coughing) GERD diet       01/13/2014 f/u ov/[facility]ert re:  cough x July 2015   Chief Complaint  Patient presents with  . Follow-up    Pt states that her cough has improved some since the last visit.  CP has not occured since the last visit. No new co's today.  Cough completely gone for 5 days while on tramadol tapered to 2 per day then started before walked out the door am of ov but no excess mucus Was on antihistamines in some form x years  after allergy shots x 10 years which worked and didn't need allergy rx while on it. Has not tried 1st gen h1 yet as prev rec rec   For drainage stop zyrtec  take chlortrimeton (chlorpheniramine) 4 mg every 4 hours available over the counter (may cause drowsiness)  For cough use delsym/tramadol and use flutter valve    01/23/2014 f/u ov/Wert re: chronic cough resolved on 1st gen h1  Chief Complaint  Patient presents with  . Follow-up    Pt states that she is not doing much coughing at  all. No new co's today.    no need for tramadol at all   Not limited by breathing from desired activities     No obvious day to day or daytime variabilty or assoc  Sob cp or chest tightness, subjective wheeze overt sinus or hb symptoms. No unusual exp hx or h/o childhood pna/ asthma or knowledge of premature birth.  Sleeping ok without nocturnal  or early am exacerbation  of respiratory  c/o's or need for noct saba. Also denies any obvious fluctuation of symptoms with weather or environmental changes or other aggravating or alleviating factors except as outlined above   Current Medications, Allergies, Complete Past Medical History, Past Surgical History, Family History, and Social History were reviewed in [employer] record.  ROS  The following are not active complaints unless bolded sore throat, dysphagia, dental problems,  itching, sneezing,  nasal congestion or excess/ purulent secretions, ear ache,   fever, chills, sweats, unintended wt loss, pleuritic or exertional cp, hemoptysis,  orthopnea pnd or leg swelling, presyncope, palpitations, heartburn, abdominal pain, anorexia, nausea, vomiting, diarrhea  or change in bowel or urinary habits, change in stools or urine, dysuria,hematuria,  rash, arthralgias, visual complaints, headache, numbness weakness or ataxia or problems with walking or coordination,  change in mood/affect or memory.                     Objective:   Physical Exam  amb healthy wf nad   12/29/2013        161 > 01/23/2014   158  Wt Readings from Last 3 Encounters:  12/16/13 160 lb (72.576 kg)  12/04/13 156 lb (70.761 kg)  11/24/13 158 lb (71.668 kg)      HEENT: nl dentition, turbinates, and orophanx. Nl external ear canals without cough reflex   NECK :  without JVD/Nodes/TM/ nl carotid upstrokes bilaterally   LUNGS: no acc muscle use, clear to A and P bilaterally without cough on insp or exp maneuvers   CV:  RRR  no s3 or murmur or increase in P2, no edema   ABD:  soft and nontender with nl excursion in the supine position. No bruits or organomegaly, bowel sounds nl  MS:  warm without deformities, calf tenderness, cyanosis or clubbing       cxr ? RUL Pna  11/24/13 CT chest s contrast 12/11/13 1. Bilateral patchy ill-defined pulmonary infiltrates trachea  prominent right upper lobe. These are most likely infectious. These  may have progressed from prior chest x-ray of 11/24/2013. To exclude  underlying malignancy followup nonenhanced chest CT following  treatment is suggested.  2. Small sliding hiatal hernia.     CXR  01/13/2014 : Resolving peripheral right lung nodular opacities. No current acute  process.  Remote granulomatous disease.      Lab Results  Component Value Date   ESRSEDRATE 31* 01/13/2014     Recent Labs Lab 01/13/14 1207  NA 138  K 3.3*  CL  101  CO2 31  BUN 20  CREATININE 1.1  GLUCOSE 76    Recent Labs Lab 01/13/14 1207  HGB 13.3  HCT 39.1  WBC 12.6*  PLT 137.0*           Assessment & Plan:   Outpatient Encounter Prescriptions as of 01/23/2014  Medication Sig  . Ascorbic Acid (VITAMIN C) 500 MG CAPS Take 1 capsule by mouth daily.  . chlorpheniramine (CHLOR-TRIMETON) 4 MG tablet Take 4 mg by mouth every 4 (four)  hours as needed.  [name] Kitchen dextromethorphan (DELSYM) 30 MG/5ML liquid Take 5 mLs by mouth 2 (two) times daily as needed for cough.  . esomeprazole (NEXIUM) 40 MG capsule Take 40 mg by mouth daily before breakfast.  . gabapentin (NEURONTIN) 600 MG tablet Take 600 mg by mouth 3 (three) times daily.  . hydrochlorothiazide (HYDRODIURIL) 25 MG tablet Take 25 mg by mouth daily.  . Magnesium 250 MG TABS Take 1 tablet by mouth daily.  . meloxicam (MOBIC) 15 MG tablet Take 1 tablet (15 mg total) by mouth daily. For pain and inflammation  . pravastatin (PRAVACHOL) 40 MG tablet Take 40 mg by mouth daily.  . ranitidine (ZANTAC) 300 MG tablet Take 1 tablet (300 mg total) by mouth at bedtime. For acid indigestion and reflux  . Respiratory Therapy Supplies (FLUTTER) DEVI Use as directed  . SYNTHROID 50 MCG tablet take 1 tablet by mouth once daily BEFORE BREAKFAST  . traMADol (ULTRAM) 50 MG tablet 1-2 every 4 hours as needed for cough or pain  . VITAMIN D, ERGOCALCIFEROL, PO Take 5,000 Int'l Units/day by mouth daily.  . [DISCONTINUED] Cetirizine HCl (ZYRTEC ALLERGY) 10 MG CAPS Take 10 mg by mouth daily.

## 2014-01-25 NOTE — Assessment & Plan Note (Signed)
-   Sinus ct  12/18/2013 > Negative paranasal sinuses - allery profile 12/29/2013 > no Eos  Ig E 44 mild dust and mold, otherwise neg RAST - Add flutter valve 01/13/2014   Seems to have done the best with use of max acid supp plus 1st gen H1 as per guidelines which are the best at eliminating pnds/gerd from ddx > should be able to just use delsym prn at this point and no pulmonary f/u needed

## 2014-01-25 NOTE — Assessment & Plan Note (Signed)
Acute onset symptoms p air travel to [place] July 2015  - cxr 01/13/14 clear   Marked serial improvement on plain cxr, no further studies needed unless directed by recurrent symptoms

## 2014-01-29 ENCOUNTER — Other Ambulatory Visit (INDEPENDENT_AMBULATORY_CARE_PROVIDER_SITE_OTHER): Payer: Medicare Other | Admitting: *Deleted

## 2014-01-29 DIAGNOSIS — Z1212 Encounter for screening for malignant neoplasm of rectum: Secondary | ICD-10-CM

## 2014-01-29 LAB — POC HEMOCCULT BLD/STL (HOME/3-CARD/SCREEN)
Card #2 Fecal Occult Blod, POC: NEGATIVE
Card #3 Fecal Occult Blood, POC: NEGATIVE
Fecal Occult Blood, POC: NEGATIVE

## 2014-02-02 DIAGNOSIS — M8580 Other specified disorders of bone density and structure, unspecified site: Secondary | ICD-10-CM | POA: Diagnosis not present

## 2014-02-05 DIAGNOSIS — R51 Headache: Secondary | ICD-10-CM | POA: Diagnosis not present

## 2014-02-05 DIAGNOSIS — G518 Other disorders of facial nerve: Secondary | ICD-10-CM | POA: Diagnosis not present

## 2014-02-05 DIAGNOSIS — G43111 Migraine with aura, intractable, with status migrainosus: Secondary | ICD-10-CM | POA: Diagnosis not present

## 2014-02-05 DIAGNOSIS — G43019 Migraine without aura, intractable, without status migrainosus: Secondary | ICD-10-CM | POA: Diagnosis not present

## 2014-02-05 DIAGNOSIS — M542 Cervicalgia: Secondary | ICD-10-CM | POA: Diagnosis not present

## 2014-02-05 DIAGNOSIS — M791 Myalgia: Secondary | ICD-10-CM | POA: Diagnosis not present

## 2014-02-05 DIAGNOSIS — G43719 Chronic migraine without aura, intractable, without status migrainosus: Secondary | ICD-10-CM | POA: Diagnosis not present

## 2014-02-11 ENCOUNTER — Ambulatory Visit (HOSPITAL_BASED_OUTPATIENT_CLINIC_OR_DEPARTMENT_OTHER): Admission: RE | Admit: 2014-02-11 | Payer: Medicare Other | Source: Ambulatory Visit | Admitting: Orthopedic Surgery

## 2014-02-11 ENCOUNTER — Encounter (HOSPITAL_BASED_OUTPATIENT_CLINIC_OR_DEPARTMENT_OTHER): Admission: RE | Payer: Self-pay | Source: Ambulatory Visit

## 2014-02-11 SURGERY — ARTHROSCOPY, KNEE
Anesthesia: Choice | Site: Knee | Laterality: Right

## 2014-02-20 ENCOUNTER — Other Ambulatory Visit: Payer: Self-pay | Admitting: Orthopedic Surgery

## 2014-02-23 ENCOUNTER — Other Ambulatory Visit: Payer: Self-pay | Admitting: Internal Medicine

## 2014-03-10 ENCOUNTER — Ambulatory Visit (INDEPENDENT_AMBULATORY_CARE_PROVIDER_SITE_OTHER): Payer: Medicare Other | Admitting: Physician Assistant

## 2014-03-10 ENCOUNTER — Encounter: Payer: Self-pay | Admitting: Physician Assistant

## 2014-03-10 VITALS — BP 116/78 | HR 78 | Temp 98.4°F | Resp 18 | Ht 64.0 in | Wt 156.0 lb

## 2014-03-10 DIAGNOSIS — J209 Acute bronchitis, unspecified: Secondary | ICD-10-CM | POA: Diagnosis not present

## 2014-03-10 MED ORDER — BENZONATATE 100 MG PO CAPS: 100.0000 mg | ORAL_CAPSULE | Freq: Four times a day (QID) | ORAL | Status: DC | PRN

## 2014-03-10 MED ORDER — PREDNISONE 20 MG PO TABS: ORAL_TABLET | ORAL | Status: AC

## 2014-03-10 MED ORDER — AZITHROMYCIN 250 MG PO TABS: ORAL_TABLET | ORAL | Status: DC

## 2014-03-10 MED ORDER — ALBUTEROL SULFATE HFA 108 (90 BASE) MCG/ACT IN AERS: 2.0000 | INHALATION_SPRAY | Freq: Four times a day (QID) | RESPIRATORY_TRACT | Status: DC | PRN

## 2014-03-10 NOTE — Patient Instructions (Signed)
-   Rest and stay hydrated.  Make sure you drink plenty of fluids to make sure urine is clear when you urinate.  Water will help thin out mucous. -Take Albuterol as prescribed. -Take Z-Pak as prescribed.   -Take prednisone as prescribed to help with inflammation. -Take Tessalon Perles as prescribed for cough.  Please call the office or message through My Chart if you have any questions. If you are not better in 10-14 days, then please call the office.   Acute Bronchitis Bronchitis is when the airways that extend from the windpipe into the lungs get red, puffy, and painful (inflamed). Bronchitis often causes thick spit (mucus) to develop. This leads to a cough. A cough is the most common symptom of bronchitis. In acute bronchitis, the condition usually begins suddenly and goes away over time (usually in 2 weeks). Smoking, allergies, and asthma can make bronchitis worse. Repeated episodes of bronchitis may cause more lung problems.  Most common cause of Bronchitis is viruses (rhinovirus, coronavirus, RSV).  Therefore, not requiring an antibiotic; as antibiotics only treat bacterial infections.  HOME CARE  Rest.  Drink enough fluids to keep your pee (urine) clear or pale yellow (unless you need to limit fluids as told by your doctor).  Only take over-the-counter or prescription medicines as told by your doctor.  Avoid smoking and secondhand smoke. These can make bronchitis worse. If you are a smoker, think about using nicotine gum or skin patches. Quitting smoking will help your lungs heal faster.  Reduce the chance of getting bronchitis again by:  Washing your hands often.  Avoiding people with cold symptoms.  Trying not to touch your hands to your mouth, nose, or eyes.  Follow up with your doctor as told. GET HELP IF: Your symptoms do not improve after 1 week of treatment. Symptoms include:  Cough.  Fever.  Coughing up thick spit.  Body aches.  Chest  congestion.  Chills.  Shortness of breath.  Sore throat. GET HELP RIGHT AWAY IF:   You have an increased fever.  You have chills.  You have severe shortness of breath.  You have bloody thick spit (sputum).  You throw up (vomit) often.  You lose too much body fluid (dehydration).  You have a severe headache.  You faint. MAKE SURE YOU:   Understand these instructions.  Will watch your condition.  Will get help right away if you are not doing well or get worse. Document Released: 09/20/2007 Document Revised: 12/04/2012 Document Reviewed: 09/24/2012 [facility] Patient Information 2015 [place], [place]. This information is not intended to replace advice given to you by your health care provider. Make sure you discuss any questions you have with your health care provider.

## 2014-03-10 NOTE — Progress Notes (Signed)
Subjective:    Patient ID: Jillian Rodgers, female    DOB: [birth year], 67 y.o.   MRN: [id number]  Sore Throat  This is a new problem. The current episode started yesterday. The problem has been gradually worsening. There has been no fever. Associated symptoms include congestion, coughing, ear pain, a hoarse voice and trouble swallowing. Pertinent negatives include no diarrhea, drooling, ear discharge, headaches, plugged ear sensation, shortness of breath, stridor, swollen glands or vomiting. She has tried nothing for the symptoms.  Cough This is a new problem. The current episode started yesterday. The problem has been gradually improving. Cough characteristics: productive with little green and then clear  Associated symptoms include chest pain, ear pain and a sore throat. Pertinent negatives include no chills, ear congestion, fever, headaches, heartburn, hemoptysis, nasal congestion, postnasal drip, rash, rhinorrhea, shortness of breath, sweats, weight loss or wheezing. She has tried nothing for the symptoms.  GFR= 51 on 01/19/14 Review of Systems  Constitutional: Negative.  Negative for fever, chills, weight loss, diaphoresis and fatigue.  HENT: Positive for congestion, ear pain, hoarse voice, sore throat, trouble swallowing and voice change. Negative for drooling, ear discharge, postnasal drip, rhinorrhea and sinus pressure.   Eyes: Negative.   Respiratory: Positive for cough and chest tightness. Negative for hemoptysis, shortness of breath, wheezing and stridor.   Cardiovascular: Positive for chest pain.       Chest pain with cough only  Gastrointestinal: Negative.  Negative for heartburn, vomiting and diarrhea.  Genitourinary: Negative.   Musculoskeletal: Negative.   Skin: Negative.  Negative for rash.  Neurological: Negative.  Negative for dizziness, light-headedness and headaches.  Psychiatric/Behavioral: Negative.    Past Medical History  Diagnosis Date  . Hypertension   .  Pre-diabetes   . Degenerative joint disease   . GERD (gastroesophageal reflux disease)   . Hyperlipidemia   . Thyroid disease   . Migraines     neurontin helps  . Diabetes mellitus without complication    Current Outpatient Prescriptions on File Prior to Visit  Medication Sig Dispense Refill  . Ascorbic Acid (VITAMIN C) 500 MG CAPS Take 1 capsule by mouth daily.    [name] Kitchen esomeprazole (NEXIUM) 40 MG capsule Take 40 mg by mouth daily before breakfast.    . gabapentin (NEURONTIN) 600 MG tablet Take 600 mg by mouth 3 (three) times daily.    . hydrochlorothiazide (HYDRODIURIL) 25 MG tablet Take 25 mg by mouth daily.    . Magnesium 250 MG TABS Take 1 tablet by mouth daily.    . meloxicam (MOBIC) 15 MG tablet Take 1 tablet (15 mg total) by mouth daily. For pain and inflammation 90 tablet 99  . pravastatin (PRAVACHOL) 40 MG tablet take 1 tablet by mouth at bedtime FOR CHOLESTEROL. 90 tablet 99  . ranitidine (ZANTAC) 300 MG tablet Take 1 tablet (300 mg total) by mouth at bedtime. For acid indigestion and reflux 90 tablet 99  . Respiratory Therapy Supplies (FLUTTER) DEVI Use as directed 1 each 0  . SYNTHROID 50 MCG tablet take 1 tablet by mouth once daily BEFORE BREAKFAST 30 tablet 6  . VITAMIN D, ERGOCALCIFEROL, PO Take 5,000 Int'l Units/day by mouth daily.     No current facility-administered medications on file prior to visit.   No Known Allergies    BP 116/78 mmHg  Pulse 78  Temp(Src) 98.4 F (36.9 C) (Temporal)  Resp 18  Ht 5\' 4"  (1.626 m)  Wt 156 lb (70.761 kg)  BMI 26.76 kg/m2  SpO2 100% Wt Readings from Last 3 Encounters:  03/10/14 156 lb (70.761 kg)  01/23/14 158 lb 3.2 oz (71.759 kg)  01/19/14 157 lb 12.8 oz (71.578 kg)   Objective:   Physical Exam  Constitutional: She is oriented to person, place, and time. She appears well-developed and well-nourished. She has a sickly appearance. No distress.  HENT:  Head: Normocephalic.  Right Ear: Tympanic membrane, external ear and ear  canal normal.  Left Ear: Tympanic membrane, external ear and ear canal normal.  Nose: Nose normal. Right sinus exhibits no maxillary sinus tenderness and no frontal sinus tenderness. Left sinus exhibits no maxillary sinus tenderness and no frontal sinus tenderness.  Mouth/Throat: Uvula is midline and mucous membranes are normal. Mucous membranes are not pale and not dry. No trismus in the jaw. No uvula swelling. Posterior oropharyngeal erythema present. No oropharyngeal exudate, posterior oropharyngeal edema or tonsillar abscesses.  Eyes: Conjunctivae and lids are normal. Pupils are equal, round, and reactive to light. Right eye exhibits no discharge. Left eye exhibits no discharge. No scleral icterus.  Neck: Trachea normal and normal range of motion. Neck supple. No tracheal deviation present.  Raspy voice.  Cardiovascular: Normal rate, regular rhythm, S1 normal, S2 normal, normal heart sounds and normal pulses.  Exam reveals no gallop, no distant heart sounds and no friction rub.   No murmur heard. Pulmonary/Chest: Effort normal. No stridor. No respiratory distress. She has no decreased breath sounds. She has wheezes. She has no rhonchi. She exhibits no tenderness.  Mild wheezes on expiration.  Lymphadenopathy:  No tenderness or LAD.  Neurological: She is alert and oriented to person, place, and time. Gait normal.  Skin: Skin is warm, dry and intact. No rash noted. She is not diaphoretic.  Psychiatric: She has a normal mood and affect. Her speech is normal and behavior is normal. Judgment and thought content normal. Cognition and memory are normal.  Vitals reviewed.     Assessment & Plan:  1. Acute bronchitis, unspecified organism -Take Z-Pak as prescribed- azithromycin (ZITHROMAX Z-PAK) 250 MG tablet; Take 2 tablets PO on day 1, then take 1 tablet PO QDaily for 4 days.  Dispense: 6 tablet; Refill: 0 - Take albuterol as prescribed - albuterol (VENTOLIN HFA) 108 (90 BASE) MCG/ACT inhaler;  Inhale 2 puffs into the lungs every 6 (six) hours as needed for wheezing or shortness of breath.  Dispense: 1 Inhaler; Refill: 1 - Take Tessalon Perles as prescribed- benzonatate (TESSALON PERLES) 100 MG capsule; Take 1 capsule (100 mg total) by mouth every 6 (six) hours as needed for cough.  Dispense: 60 capsule; Refill: 1 -Take Prednisone as prescribed-  predniSONE (DELTASONE) 20 MG tablet; Take 3 tablets PO QDaily for 3 days, then take 2 tablets PO QDaily for 3 days, then take 1 tablet PO QDaily for 3 days  Dispense: 18 tablet; Refill: 0  Discussed medication effects and SE's.  Pt agreed to treatment plan. If you are not feeling better in 10-14 days, then please call the office. Please keep your follow up appt on 04/29/14.  Jaman Aro, Stephani Police, PA-C 12:19 PM [facility]

## 2014-03-14 ENCOUNTER — Encounter: Payer: Self-pay | Admitting: *Deleted

## 2014-03-16 ENCOUNTER — Other Ambulatory Visit: Payer: Self-pay | Admitting: *Deleted

## 2014-03-16 MED ORDER — HYDROCHLOROTHIAZIDE 25 MG PO TABS: 25.0000 mg | ORAL_TABLET | Freq: Every day | ORAL | Status: DC

## 2014-03-20 ENCOUNTER — Other Ambulatory Visit: Payer: Self-pay | Admitting: Physician Assistant

## 2014-03-23 ENCOUNTER — Encounter: Payer: Self-pay | Admitting: Physician Assistant

## 2014-03-23 ENCOUNTER — Ambulatory Visit (INDEPENDENT_AMBULATORY_CARE_PROVIDER_SITE_OTHER): Payer: Medicare Other | Admitting: Physician Assistant

## 2014-03-23 VITALS — BP 128/72 | HR 68 | Temp 97.7°F | Resp 16 | Ht 64.0 in | Wt 157.0 lb

## 2014-03-23 DIAGNOSIS — K2100 Gastro-esophageal reflux disease with esophagitis, without bleeding: Secondary | ICD-10-CM

## 2014-03-23 DIAGNOSIS — R059 Cough, unspecified: Secondary | ICD-10-CM

## 2014-03-23 DIAGNOSIS — R05 Cough: Secondary | ICD-10-CM

## 2014-03-23 DIAGNOSIS — J209 Acute bronchitis, unspecified: Secondary | ICD-10-CM | POA: Diagnosis not present

## 2014-03-23 LAB — CBC WITH DIFFERENTIAL/PLATELET
Basophils Absolute: 0 10*3/uL (ref 0.0–0.1)
Basophils Relative: 0 % (ref 0–1)
Eosinophils Absolute: 0.1 10*3/uL (ref 0.0–0.7)
Eosinophils Relative: 1 % (ref 0–5)
HCT: 36.5 % (ref 36.0–46.0)
Hemoglobin: 12.5 g/dL (ref 12.0–15.0)
Lymphocytes Relative: 31 % (ref 12–46)
Lymphs Abs: 2.4 10*3/uL (ref 0.7–4.0)
MCH: 29.7 pg (ref 26.0–34.0)
MCHC: 34.2 g/dL (ref 30.0–36.0)
MCV: 86.7 fL (ref 78.0–100.0)
MPV: 10.4 fL (ref 9.4–12.4)
Monocytes Absolute: 0.6 10*3/uL (ref 0.1–1.0)
Monocytes Relative: 8 % (ref 3–12)
Neutro Abs: 4.6 10*3/uL (ref 1.7–7.7)
Neutrophils Relative %: 60 % (ref 43–77)
Platelets: 107 10*3/uL — ABNORMAL LOW (ref 150–400)
RBC: 4.21 MIL/uL (ref 3.87–5.11)
RDW: 13.5 % (ref 11.5–15.5)
WBC: 7.7 10*3/uL (ref 4.0–10.5)

## 2014-03-23 MED ORDER — LEVOFLOXACIN 500 MG PO TABS: 500.0000 mg | ORAL_TABLET | Freq: Every day | ORAL | Status: AC

## 2014-03-23 MED ORDER — RANITIDINE HCL 300 MG PO TABS: 300.0000 mg | ORAL_TABLET | Freq: Two times a day (BID) | ORAL | Status: DC

## 2014-03-23 MED ORDER — BENZONATATE 100 MG PO CAPS: 100.0000 mg | ORAL_CAPSULE | Freq: Four times a day (QID) | ORAL | Status: DC | PRN

## 2014-03-23 NOTE — Progress Notes (Signed)
   Subjective:    Patient ID: Jillian Rodgers, female    DOB: March 03, 1946, 68 y.o.   MRN: [id number]  HPI 68 y.o. female treated with zpak, ventolin, and tessalon perles on 1/24 presents with continuing cough. States she is feeling better, still with fatigue and cough. Tessalon perles help, last 6 hours but then cough is back. Has dark mucus coming, denies SOB, fever, chills. Leaving for [place] on Saturday and is concerned.    Review of Systems  Constitutional: Negative.   HENT: Positive for congestion, ear pain, postnasal drip, sinus pressure, sore throat and voice change. Negative for dental problem, drooling, ear discharge, facial swelling, hearing loss, mouth sores, nosebleeds, rhinorrhea, sneezing, tinnitus and trouble swallowing.   Eyes: Negative.   Respiratory: Positive for cough. Negative for apnea, choking, chest tightness, shortness of breath, wheezing and stridor.   Cardiovascular: Negative.   Gastrointestinal: Negative.   Genitourinary: Negative.   Musculoskeletal: Negative.   Skin: Negative.   Neurological: Negative.   Psychiatric/Behavioral: Negative.        Objective:   Physical Exam  Constitutional: She appears well-developed and well-nourished.  HENT:  Head: Normocephalic and atraumatic.  Right Ear: External ear normal.  Nose: Right sinus exhibits maxillary sinus tenderness. Right sinus exhibits no frontal sinus tenderness. Left sinus exhibits maxillary sinus tenderness. Left sinus exhibits no frontal sinus tenderness.  Eyes: Conjunctivae and EOM are normal.  Neck: Normal range of motion. Neck supple.  Cardiovascular: Normal rate, regular rhythm, normal heart sounds and intact distal pulses.   Pulmonary/Chest: Effort normal and breath sounds normal. No respiratory distress. She has no wheezes.  Abdominal: Soft. Bowel sounds are normal.  Lymphadenopathy:    She has no cervical adenopathy.  Skin: Skin is warm and dry.       Assessment & Plan:   Cough: Check CBC, will give levaquin to take to [place] Possible nocturnal GERD- Please increase your zantac to twice a day and you can add omeprazole in the AM and zantac at night PND- restart zyrtec at night Work hard to stop throat clearing.  Use sugar free candy to ease cough and throat irritation.  We will use Tessalon Perles to suppress cough so your throat irritation can recover.

## 2014-03-23 NOTE — Patient Instructions (Addendum)
Cough: Possible nocturnal GERD- continue the nexium in the morning and zantac at night, can add zantac in the AM PND- restart zyrtec at night Work hard to stop throat clearing.  Use sugar free candy to ease cough and throat irritation.  We will use Tessalon Perles to suppress cough so your throat irritation can recover.   Gastroesophageal Reflux Disease, Adult Gastroesophageal reflux disease (GERD) happens when acid from your stomach flows up into the esophagus. When acid comes in contact with the esophagus, the acid causes soreness (inflammation) in the esophagus. Over time, GERD may create small holes (ulcers) in the lining of the esophagus. CAUSES   Increased body weight. This puts pressure on the stomach, making acid rise from the stomach into the esophagus.  Smoking. This increases acid production in the stomach.  Drinking alcohol. This causes decreased pressure in the lower esophageal sphincter (valve or ring of muscle between the esophagus and stomach), allowing acid from the stomach into the esophagus.  Late evening meals and a full stomach. This increases pressure and acid production in the stomach.  A malformed lower esophageal sphincter. Sometimes, no cause is found. SYMPTOMS   Burning pain in the lower part of the mid-chest behind the breastbone and in the mid-stomach area. This may occur twice a week or more often.  Trouble swallowing.  Sore throat.  Dry cough.  Asthma-like symptoms including chest tightness, shortness of breath, or wheezing. DIAGNOSIS  Your caregiver may be able to diagnose GERD based on your symptoms. In some cases, X-rays and other tests may be done to check for complications or to check the condition of your stomach and esophagus. TREATMENT  Your caregiver may recommend over-the-counter or prescription medicines to help decrease acid production. Ask your caregiver before starting or adding any new medicines.  HOME CARE INSTRUCTIONS   Change the  factors that you can control. Ask your caregiver for guidance concerning weight loss, quitting smoking, and alcohol consumption.  Avoid foods and drinks that make your symptoms worse, such as:  Caffeine or alcoholic drinks.  Chocolate.  Peppermint or mint flavorings.  Garlic and onions.  Spicy foods.  Citrus fruits, such as oranges, lemons, or limes.  Tomato-based foods such as sauce, chili, salsa, and pizza.  Fried and fatty foods.  Avoid lying down for the 3 hours prior to your bedtime or prior to taking a nap.  Eat small, frequent meals instead of large meals.  Wear loose-fitting clothing. Do not wear anything tight around your waist that causes pressure on your stomach.  Raise the head of your bed 6 to 8 inches with wood blocks to help you sleep. Extra pillows will not help.  Only take over-the-counter or prescription medicines for pain, discomfort, or fever as directed by your caregiver.  Do not take aspirin, ibuprofen, or other nonsteroidal anti-inflammatory drugs (NSAIDs). SEEK IMMEDIATE MEDICAL CARE IF:   You have pain in your arms, neck, jaw, teeth, or back.  Your pain increases or changes in intensity or duration.  You develop nausea, vomiting, or sweating (diaphoresis).  You develop shortness of breath, or you faint.  Your vomit is green, yellow, black, or looks like coffee grounds or blood.  Your stool is red, bloody, or black. These symptoms could be signs of other problems, such as heart disease, gastric bleeding, or esophageal bleeding. MAKE SURE YOU:   Understand these instructions.  Will watch your condition.  Will get help right away if you are not doing well or get worse. Document  Released: 01/11/2005 Document Revised: 06/26/2011 Document Reviewed: 10/21/2010 [facility] Patient Information 2015 [place], [place]. This information is not intended to replace advice given to you by your health care provider. Make sure you discuss any questions you have  with your health care provider.

## 2014-03-24 DIAGNOSIS — M23206 Derangement of unspecified meniscus due to old tear or injury, right knee: Secondary | ICD-10-CM | POA: Diagnosis not present

## 2014-04-06 ENCOUNTER — Encounter (HOSPITAL_BASED_OUTPATIENT_CLINIC_OR_DEPARTMENT_OTHER): Payer: Self-pay | Admitting: *Deleted

## 2014-04-06 NOTE — Progress Notes (Signed)
Pt just got back from [place] Will come by for bmet

## 2014-04-07 NOTE — H&P (Signed)
Jillian Rodgers is an 68 y.o. female.   Chief Complaint: Right Knee Pain  HPI: Jillian Rodgers is here today for followup on right knee pain.  She was last seen on 01/20/14 at which time she was scheduled for a right knee arthroscopy for a degenerative meniscal tear.  This is scheduled for 04/13/14.  She is traveling to [place] and she'd like to have some temporary relief for this trip.  She is here today requesting a cortisone injection.  She denies any new injury.  She denies any fevers chills night sweats or other signs of infection.  She is noticed continued pain in the knee.  Past Medical History  Diagnosis Date  . Hypertension   . Pre-diabetes   . Degenerative joint disease   . GERD (gastroesophageal reflux disease)   . Hyperlipidemia   . Thyroid disease   . Migraines     neurontin helps  . Diabetes mellitus without complication     Past Surgical History  Procedure Laterality Date  . Ventral hernia repair    . Cholecystectomy  1996    open  . Pilonidal cyst excision  1972  . Tonsillectomy    . Colonoscopy    . Upper gi endoscopy      Family History  Problem Relation Age of Onset  . Ovarian cancer Mother   . Breast cancer Sister   . Thyroid cancer Sister   . Stroke Paternal Uncle    Social History:  reports that she has never smoked. She has never used smokeless tobacco. She reports that she does not drink alcohol or use illicit drugs.  Allergies: No Known Allergies  No prescriptions prior to admission    No results found for this or any previous visit (from the past 48 hour(s)). No results found.  Review of Systems  Constitutional: Negative.   HENT: Negative.   Eyes: Negative.   Respiratory: Negative.   Cardiovascular: Positive for leg swelling.  Gastrointestinal: Negative.   Genitourinary: Negative.   Musculoskeletal: Positive for joint pain.  Skin: Negative.   Neurological: Positive for dizziness.  Endo/Heme/Allergies: Bruises/bleeds easily.   Psychiatric/Behavioral: Negative.     Height 5\' 4"  (1.626 m), weight 71.215 kg (157 lb). Physical Exam  Constitutional: She is oriented to person, place, and time. She appears well-developed and well-nourished.  HENT:  Head: Normocephalic and atraumatic.  Eyes: Pupils are equal, round, and reactive to light.  Neck: Normal range of motion. Neck supple.  Cardiovascular: Intact distal pulses.   Respiratory: Effort normal.  Musculoskeletal: She exhibits tenderness.  Patient continues to have good strength and good range of motion bilaterally in her knees.  Patient's right knee continues to have pain over the medial and lateral joint lines.  She continues to have pain with rotary stress.  No instability.  Her calves are soft and nontender.  She is neurovascularly intact distally.  Neurological: She is alert and oriented to person, place, and time.  Skin: Skin is warm and dry.  Psychiatric: She has a normal mood and affect. Her behavior is normal. Judgment and thought content normal.     Assessment/Plan Assess: Right knee pain with symptoms consistent with meniscal tear having responded well to anesthetic injection in the past  Plan: Treatment options are once again discussed with the patient.  Patient has agreed and consented to a right knee steroid injection.  She is made aware the benefits risks and potential adverse effects of injection.  She wishes to proceed with the injection.  Patient is to return as needed.  She is planning on keeping her appointment for the knee arthroscopy on 04/13/14.   Jillian Rodgers R 04/07/2014, 8:31 AM

## 2014-04-08 ENCOUNTER — Encounter (HOSPITAL_BASED_OUTPATIENT_CLINIC_OR_DEPARTMENT_OTHER)
Admission: RE | Admit: 2014-04-08 | Discharge: 2014-04-08 | Disposition: A | Discharge status: Home / Self Care | Payer: Medicare Other | Source: Ambulatory Visit | Attending: Orthopedic Surgery | Admitting: Orthopedic Surgery

## 2014-04-08 DIAGNOSIS — G43111 Migraine with aura, intractable, with status migrainosus: Secondary | ICD-10-CM | POA: Diagnosis not present

## 2014-04-08 DIAGNOSIS — G43719 Chronic migraine without aura, intractable, without status migrainosus: Secondary | ICD-10-CM | POA: Diagnosis not present

## 2014-04-08 DIAGNOSIS — Z01812 Encounter for preprocedural laboratory examination: Secondary | ICD-10-CM | POA: Insufficient documentation

## 2014-04-08 DIAGNOSIS — G43019 Migraine without aura, intractable, without status migrainosus: Secondary | ICD-10-CM | POA: Diagnosis not present

## 2014-04-08 LAB — BASIC METABOLIC PANEL
Anion gap: 7 (ref 5–15)
BUN: 23 mg/dL (ref 6–23)
CO2: 30 mmol/L (ref 19–32)
Calcium: 10 mg/dL (ref 8.4–10.5)
Chloride: 109 mEq/L (ref 96–112)
Creatinine, Ser: 1.18 mg/dL — ABNORMAL HIGH (ref 0.50–1.10)
GFR calc Af Amer: 53 mL/min — ABNORMAL LOW (ref >90)
GFR calc non Af Amer: 46 mL/min — ABNORMAL LOW (ref >90)
Glucose, Bld: 83 mg/dL (ref 70–99)
Potassium: 5.1 mmol/L (ref 3.5–5.1)
Sodium: 146 mmol/L — ABNORMAL HIGH (ref 135–145)

## 2014-04-13 ENCOUNTER — Encounter (HOSPITAL_BASED_OUTPATIENT_CLINIC_OR_DEPARTMENT_OTHER): Payer: Self-pay | Admitting: Anesthesiology

## 2014-04-13 ENCOUNTER — Ambulatory Visit (HOSPITAL_BASED_OUTPATIENT_CLINIC_OR_DEPARTMENT_OTHER): Admission: RE | Admit: 2014-04-13 | Payer: Medicare Other | Source: Ambulatory Visit | Admitting: Orthopedic Surgery

## 2014-04-13 SURGERY — ARTHROSCOPY, KNEE
Anesthesia: Choice | Laterality: Right

## 2014-04-13 MED ORDER — BUPIVACAINE HCL (PF) 0.5 % IJ SOLN
INTRAMUSCULAR | Status: AC
  Filled 2014-04-13: qty 30

## 2014-04-13 MED ORDER — MIDAZOLAM HCL 2 MG/2ML IJ SOLN
INTRAMUSCULAR | Status: AC
  Filled 2014-04-13: qty 2

## 2014-04-13 MED ORDER — BUPIVACAINE-EPINEPHRINE (PF) 0.5% -1:200000 IJ SOLN
INTRAMUSCULAR | Status: AC
  Filled 2014-04-13: qty 30

## 2014-04-13 MED ORDER — EPINEPHRINE HCL 1 MG/ML IJ SOLN
INTRAMUSCULAR | Status: AC
  Filled 2014-04-13: qty 1

## 2014-04-13 MED ORDER — FENTANYL CITRATE 0.05 MG/ML IJ SOLN
INTRAMUSCULAR | Status: AC
  Filled 2014-04-13: qty 6

## 2014-04-14 ENCOUNTER — Other Ambulatory Visit: Payer: Self-pay

## 2014-04-14 MED ORDER — ESOMEPRAZOLE MAGNESIUM 40 MG PO CPDR: 40.0000 mg | DELAYED_RELEASE_CAPSULE | Freq: Every day | ORAL | Status: DC

## 2014-04-29 ENCOUNTER — Encounter: Payer: Self-pay | Admitting: Physician Assistant

## 2014-04-29 ENCOUNTER — Ambulatory Visit (INDEPENDENT_AMBULATORY_CARE_PROVIDER_SITE_OTHER): Payer: Medicare Other | Admitting: Physician Assistant

## 2014-04-29 VITALS — BP 128/72 | HR 64 | Temp 97.7°F | Resp 16 | Ht 64.0 in | Wt 156.0 lb

## 2014-04-29 DIAGNOSIS — R7309 Other abnormal glucose: Secondary | ICD-10-CM

## 2014-04-29 DIAGNOSIS — E039 Hypothyroidism, unspecified: Secondary | ICD-10-CM

## 2014-04-29 DIAGNOSIS — E559 Vitamin D deficiency, unspecified: Secondary | ICD-10-CM

## 2014-04-29 DIAGNOSIS — Z79899 Other long term (current) drug therapy: Secondary | ICD-10-CM

## 2014-04-29 DIAGNOSIS — E785 Hyperlipidemia, unspecified: Secondary | ICD-10-CM

## 2014-04-29 DIAGNOSIS — I10 Essential (primary) hypertension: Secondary | ICD-10-CM

## 2014-04-29 DIAGNOSIS — R7303 Prediabetes: Secondary | ICD-10-CM

## 2014-04-29 DIAGNOSIS — N183 Chronic kidney disease, stage 3 unspecified: Secondary | ICD-10-CM

## 2014-04-29 LAB — CBC WITH DIFFERENTIAL/PLATELET
Basophils Absolute: 0 10*3/uL (ref 0.0–0.1)
Basophils Relative: 0 % (ref 0–1)
Eosinophils Absolute: 0.1 10*3/uL (ref 0.0–0.7)
Eosinophils Relative: 1 % (ref 0–5)
HCT: 38 % (ref 36.0–46.0)
Hemoglobin: 12.9 g/dL (ref 12.0–15.0)
Lymphocytes Relative: 24 % (ref 12–46)
Lymphs Abs: 1.9 10*3/uL (ref 0.7–4.0)
MCH: 29.7 pg (ref 26.0–34.0)
MCHC: 33.9 g/dL (ref 30.0–36.0)
MCV: 87.6 fL (ref 78.0–100.0)
MPV: 10.6 fL (ref 8.6–12.4)
Monocytes Absolute: 0.6 10*3/uL (ref 0.1–1.0)
Monocytes Relative: 8 % (ref 3–12)
Neutro Abs: 5.4 10*3/uL (ref 1.7–7.7)
Neutrophils Relative %: 67 % (ref 43–77)
Platelets: 112 10*3/uL — ABNORMAL LOW (ref 150–400)
RBC: 4.34 MIL/uL (ref 3.87–5.11)
RDW: 13.4 % (ref 11.5–15.5)
WBC: 8 10*3/uL (ref 4.0–10.5)

## 2014-04-29 NOTE — Patient Instructions (Signed)
Impingement Syndrome- get cervical neck pillow, given exercises (pendulaum and pie in the face), if not better then will get Xray/send to PT- Go to the ER if any CP, SOB, nausea, dizziness, severe HA, changes vision/speech     Bad carbs also include fruit juice, alcohol, and sweet tea. These are empty calories that do not signal to your brain that you are full.   Please remember the good carbs are still carbs which convert into sugar. So please measure them out no more than 1/2-1 cup of rice, oatmeal, pasta, and beans  Veggies are however free foods! Pile them on.   Not all fruit is created equal. Please see the list below, the fruit at the bottom is higher in sugars than the fruit at the top. Please avoid all dried fruits.

## 2014-04-29 NOTE — Progress Notes (Signed)
Assessment and Plan:  Hypertension: Continue medication, monitor blood pressure at home. Continue DASH diet.  Reminder to go to the ER if any CP, SOB, nausea, dizziness, severe HA, changes vision/speech, left arm numbness and tingling, and jaw pain. Cholesterol: Continue diet and exercise. Check cholesterol.  Pre-diabetes-Continue diet and exercise. Check A1C Vitamin D Def- check level and continue medications.  CKD- Increase fluids, avoid NSAIDS, monitor sugars, will monitor Hypothyroidism-check TSH level, continue medications the same, reminded to take on an empty stomach 30-45mins before food.  Impingement Syndrome- get cervical neck pillow, given exercises,  is not ordered cervical neck xray, suggest PT/ follow up in the office if not better.    Continue diet and meds as discussed. Further disposition pending results of labs.  HPI 69 y.o. female  presents for 3 month follow up with hypertension, hyperlipidemia, prediabetes and vitamin D. 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  She does workout. She denies chest pain, shortness of breath, dizziness.  She is on cholesterol medication, pravastatin 40 and denies myalgias. Her cholesterol is at goal. The cholesterol last visit was:   Lab Results  Component Value Date   CHOL 181 01/19/2014   HDL 73 01/19/2014   LDLCALC 82 01/19/2014   TRIG 132 01/19/2014   CHOLHDL 2.5 01/19/2014  She has been working on diet and exercise for prediabetes, and denies paresthesia of the feet, polydipsia, polyuria and visual disturbances. Last A1C in the office was:  Lab Results  Component Value Date   HGBA1C 5.9* 01/19/2014  Patient is on Vitamin D supplement., this was decreased last time, she is on 5000 IU 5 days a week and off 2 days a week.  Lab Results  Component Value Date   VD25OH 103* 01/19/2014  She is on thyroid medication. Her medication was not changed last visit.   Lab Results  Component Value Date   TSH 1.253 01/19/2014  .She has GERD, is on zantac 300 mg at night and on nexium.    She has been following up with Dr. Hardin Negus for her right knee, she got a steroid injection before [place] and has been feeling better, did not have arthroscopy.   Current Medications:  Current Outpatient Prescriptions on File Prior to Visit  Medication Sig Dispense Refill  . Ascorbic Acid (VITAMIN C) 500 MG CAPS Take 1 capsule by mouth daily.    . benzonatate (TESSALON PERLES) 100 MG capsule Take 1 capsule (100 mg total) by mouth every 6 (six) hours as needed for cough. 60 capsule 1  . esomeprazole (NEXIUM) 40 MG capsule Take 1 capsule (40 mg total) by mouth daily before breakfast. 30 capsule 5  . gabapentin (NEURONTIN) 600 MG tablet Take 600 mg by mouth 3 (three) times daily.    . hydrochlorothiazide (HYDRODIURIL) 25 MG tablet Take 1 tablet (25 mg total) by mouth daily. 90 tablet 1  . Magnesium 250 MG TABS Take 1 tablet by mouth daily.    . meloxicam (MOBIC) 15 MG tablet Take 1 tablet (15 mg total) by mouth daily. For pain and inflammation 90 tablet 99  . pravastatin (PRAVACHOL) 40 MG tablet take 1 tablet by mouth at bedtime FOR CHOLESTEROL. 90 tablet 99  . ranitidine (ZANTAC) 300 MG tablet Take 1 tablet (300 mg total) by mouth 2 (two) times daily. For acid indigestion and reflux 180 tablet 99  . Respiratory Therapy Supplies (FLUTTER) DEVI  Use as directed 1 each 0  . SYNTHROID 50 MCG tablet take 1 tablet by mouth once daily BEFORE BREAKFAST. 30 tablet 6  . VITAMIN D, ERGOCALCIFEROL, PO Take 5,000 Int'l Units/day by mouth daily.     No current facility-administered medications on file prior to visit.   Medical History:  Past Medical History  Diagnosis Date  . Hypertension   . Pre-diabetes   . Degenerative joint disease   . GERD (gastroesophageal reflux disease)   . Hyperlipidemia   . Thyroid disease   . Migraines     neurontin helps  . Diabetes mellitus without  complication    Allergies: No Known Allergies   Review of Systems:  Review of Systems  Constitutional: Negative.   HENT: Negative.   Respiratory: Negative.   Cardiovascular: Negative.   Gastrointestinal: Negative.   Genitourinary: Negative.   Musculoskeletal: Negative.   Skin: Negative.   Neurological: Positive for tingling (right shoulder if holding it up for a long time while shopping). Negative for dizziness, tremors, sensory change, speech change, focal weakness, seizures and loss of consciousness.  Psychiatric/Behavioral: Negative.      Family history- Review and unchanged Social history- Review and unchanged Physical Exam: BP 128/72 mmHg  Pulse 64  Temp(Src) 97.7 F (36.5 C)  Resp 16  Ht 5\' 4"  (1.626 m)  Wt 156 lb (70.761 kg)  BMI 26.76 kg/m2 Wt Readings from Last 3 Encounters:  04/29/14 156 lb (70.761 kg)  03/23/14 157 lb (71.215 kg)  03/10/14 156 lb (70.761 kg)   General Appearance: Well nourished, in no apparent distress. Eyes: PERRLA, EOMs, conjunctiva no swelling or erythema Sinuses: No Frontal/maxillary tenderness ENT/Mouth: Ext aud canals clear, TMs without erythema, bulging. No erythema, swelling, or exudate on post pharynx.  Tonsils not swollen or erythematous. Hearing normal.  Neck: Supple, thyroid normal.  Respiratory: Respiratory effort normal, BS equal bilaterally without rales, rhonchi, wheezing or stridor.  Cardio: RRR with no MRGs. Brisk peripheral pulses without edema.  Abdomen: Soft, + BS.  Non tender, no guarding, rebound, hernias, masses. Lymphatics: Non tender without lymphadenopathy.  Musculoskeletal: Full ROM, 5/5 strength, normal gait.  Skin: Warm, dry without rashes, lesions, ecchymosis.  Neuro: Cranial nerves intact. Normal muscle tone, no cerebellar symptoms. Sensation intact.  Psych: Awake and oriented X 3, normal affect, Insight and Judgment appropriate.    Vicie Mutters, PA-C 11:28 AM [facility]

## 2014-04-30 LAB — HEMOGLOBIN A1C
Hgb A1c MFr Bld: 5.7 % — ABNORMAL HIGH (ref <5.7)
Mean Plasma Glucose: 117 mg/dL — ABNORMAL HIGH (ref <117)

## 2014-04-30 LAB — VITAMIN D 25 HYDROXY (VIT D DEFICIENCY, FRACTURES): Vit D, 25-Hydroxy: 73 ng/mL (ref 30–100)

## 2014-04-30 LAB — LIPID PANEL
Cholesterol: 156 mg/dL (ref 0–200)
HDL: 61 mg/dL (ref >39)
LDL Cholesterol: 80 mg/dL (ref 0–99)
Total CHOL/HDL Ratio: 2.6 Ratio
Triglycerides: 73 mg/dL (ref <150)
VLDL: 15 mg/dL (ref 0–40)

## 2014-04-30 LAB — BASIC METABOLIC PANEL WITH GFR
BUN: 42 mg/dL — ABNORMAL HIGH (ref 6–23)
CO2: 27 mEq/L (ref 19–32)
Calcium: 9.7 mg/dL (ref 8.4–10.5)
Chloride: 103 mEq/L (ref 96–112)
Creat: 1.22 mg/dL — ABNORMAL HIGH (ref 0.50–1.10)
GFR, Est African American: 52 mL/min — ABNORMAL LOW
GFR, Est Non African American: 45 mL/min — ABNORMAL LOW
Glucose, Bld: 79 mg/dL (ref 70–99)
Potassium: 3.9 mEq/L (ref 3.5–5.3)
Sodium: 143 mEq/L (ref 135–145)

## 2014-04-30 LAB — HEPATIC FUNCTION PANEL
ALT: 15 U/L (ref 0–35)
AST: 17 U/L (ref 0–37)
Albumin: 3.9 g/dL (ref 3.5–5.2)
Alkaline Phosphatase: 61 U/L (ref 39–117)
Bilirubin, Direct: 0.1 mg/dL (ref 0.0–0.3)
Indirect Bilirubin: 0.5 mg/dL (ref 0.2–1.2)
Total Bilirubin: 0.6 mg/dL (ref 0.2–1.2)
Total Protein: 6.3 g/dL (ref 6.0–8.3)

## 2014-04-30 LAB — MAGNESIUM: Magnesium: 2 mg/dL (ref 1.5–2.5)

## 2014-04-30 LAB — TSH: TSH: 1.453 u[IU]/mL (ref 0.350–4.500)

## 2014-04-30 LAB — INSULIN, FASTING: Insulin fasting, serum: 4.5 u[IU]/mL (ref 2.0–19.6)

## 2014-05-27 ENCOUNTER — Other Ambulatory Visit: Payer: Self-pay | Admitting: Internal Medicine

## 2014-06-02 ENCOUNTER — Ambulatory Visit (INDEPENDENT_AMBULATORY_CARE_PROVIDER_SITE_OTHER): Payer: Medicare Other | Admitting: *Deleted

## 2014-06-02 DIAGNOSIS — Z79899 Other long term (current) drug therapy: Secondary | ICD-10-CM

## 2014-06-02 LAB — BASIC METABOLIC PANEL WITH GFR
BUN: 20 mg/dL (ref 6–23)
CO2: 29 mEq/L (ref 19–32)
Calcium: 10 mg/dL (ref 8.4–10.5)
Chloride: 102 mEq/L (ref 96–112)
Creat: 1.13 mg/dL — ABNORMAL HIGH (ref 0.50–1.10)
GFR, Est African American: 57 mL/min — ABNORMAL LOW
GFR, Est Non African American: 49 mL/min — ABNORMAL LOW
Glucose, Bld: 64 mg/dL — ABNORMAL LOW (ref 70–99)
Potassium: 3.6 mEq/L (ref 3.5–5.3)
Sodium: 139 mEq/L (ref 135–145)

## 2014-06-02 NOTE — Progress Notes (Signed)
Patient ID: Jillian Rodgers, female   DOB: [birth year], 69 y.o.   MRN: [id number] Patient presents for recheck BMET.  Patient states she was told to cut HCTZ rx in half and take half of a pill daily = 12.5 mg daily.

## 2014-07-29 ENCOUNTER — Encounter: Payer: Self-pay | Admitting: Internal Medicine

## 2014-07-29 ENCOUNTER — Ambulatory Visit (INDEPENDENT_AMBULATORY_CARE_PROVIDER_SITE_OTHER): Payer: Medicare Other | Admitting: Internal Medicine

## 2014-07-29 VITALS — BP 134/80 | HR 64 | Temp 97.2°F | Resp 16 | Ht 63.75 in | Wt 154.8 lb

## 2014-07-29 DIAGNOSIS — E559 Vitamin D deficiency, unspecified: Secondary | ICD-10-CM

## 2014-07-29 DIAGNOSIS — I10 Essential (primary) hypertension: Secondary | ICD-10-CM

## 2014-07-29 DIAGNOSIS — E785 Hyperlipidemia, unspecified: Secondary | ICD-10-CM

## 2014-07-29 DIAGNOSIS — Z79899 Other long term (current) drug therapy: Secondary | ICD-10-CM

## 2014-07-29 DIAGNOSIS — E039 Hypothyroidism, unspecified: Secondary | ICD-10-CM

## 2014-07-29 DIAGNOSIS — R7309 Other abnormal glucose: Secondary | ICD-10-CM

## 2014-07-29 DIAGNOSIS — R7303 Prediabetes: Secondary | ICD-10-CM

## 2014-07-29 LAB — CBC WITH DIFFERENTIAL/PLATELET
Basophils Absolute: 0 10*3/uL (ref 0.0–0.1)
Basophils Relative: 0 % (ref 0–1)
Eosinophils Absolute: 0.1 10*3/uL (ref 0.0–0.7)
Eosinophils Relative: 1 % (ref 0–5)
HCT: 37 % (ref 36.0–46.0)
Hemoglobin: 12.4 g/dL (ref 12.0–15.0)
Lymphocytes Relative: 25 % (ref 12–46)
Lymphs Abs: 1.6 10*3/uL (ref 0.7–4.0)
MCH: 29.3 pg (ref 26.0–34.0)
MCHC: 33.5 g/dL (ref 30.0–36.0)
MCV: 87.5 fL (ref 78.0–100.0)
MPV: 10.6 fL (ref 8.6–12.4)
Monocytes Absolute: 0.4 10*3/uL (ref 0.1–1.0)
Monocytes Relative: 7 % (ref 3–12)
Neutro Abs: 4.2 10*3/uL (ref 1.7–7.7)
Neutrophils Relative %: 67 % (ref 43–77)
Platelets: 135 10*3/uL — ABNORMAL LOW (ref 150–400)
RBC: 4.23 MIL/uL (ref 3.87–5.11)
RDW: 13.4 % (ref 11.5–15.5)
WBC: 6.2 10*3/uL (ref 4.0–10.5)

## 2014-07-29 LAB — HEPATIC FUNCTION PANEL
ALT: 13 U/L (ref 0–35)
AST: 18 U/L (ref 0–37)
Albumin: 4.1 g/dL (ref 3.5–5.2)
Alkaline Phosphatase: 69 U/L (ref 39–117)
Bilirubin, Direct: 0.1 mg/dL (ref 0.0–0.3)
Indirect Bilirubin: 0.6 mg/dL (ref 0.2–1.2)
Total Bilirubin: 0.7 mg/dL (ref 0.2–1.2)
Total Protein: 6.3 g/dL (ref 6.0–8.3)

## 2014-07-29 LAB — LIPID PANEL
Cholesterol: 150 mg/dL (ref 0–200)
HDL: 75 mg/dL (ref >=46)
LDL Cholesterol: 57 mg/dL (ref 0–99)
Total CHOL/HDL Ratio: 2 Ratio
Triglycerides: 90 mg/dL (ref <150)
VLDL: 18 mg/dL (ref 0–40)

## 2014-07-29 LAB — BASIC METABOLIC PANEL WITH GFR
BUN: 22 mg/dL (ref 6–23)
CO2: 24 mEq/L (ref 19–32)
Calcium: 9.5 mg/dL (ref 8.4–10.5)
Chloride: 106 mEq/L (ref 96–112)
Creat: 0.96 mg/dL (ref 0.50–1.10)
GFR, Est African American: 69 mL/min
GFR, Est Non African American: 60 mL/min
Glucose, Bld: 74 mg/dL (ref 70–99)
Potassium: 4.1 mEq/L (ref 3.5–5.3)
Sodium: 141 mEq/L (ref 135–145)

## 2014-07-29 LAB — HEMOGLOBIN A1C
Hgb A1c MFr Bld: 5.6 % (ref <5.7)
Mean Plasma Glucose: 114 mg/dL (ref <117)

## 2014-07-29 LAB — TSH: TSH: 1.804 u[IU]/mL (ref 0.350–4.500)

## 2014-07-29 LAB — MAGNESIUM: Magnesium: 1.9 mg/dL (ref 1.5–2.5)

## 2014-07-29 NOTE — Patient Instructions (Signed)

## 2014-07-29 NOTE — Progress Notes (Signed)
Patient ID: Jillian Rodgers, female   DOB: Feb 19, 1946, 69 y.o.   MRN: [id number]   This very nice 69 y.o. MWF presents for 3 month follow up with Hypertension, Hyperlipidemia, Pre-Diabetes and Vitamin D Deficiency.    Patient has been monitored expectantly for labile HTN since the 1990's & BP has been controlled at home. Today's BP: 134/80 mmHg. Patient has had no complaints of any cardiac type chest pain, palpitations, dyspnea/orthopnea/PND, dizziness, claudication, or dependent edema.   Hyperlipidemia is controlled with diet & meds. Patient denies myalgias or other med SE's. Last Lipids were at goal - Total Chol 150; HDL 75; LDL 57; Trig 90 on 07/29/2014.   Also, the patient has history of PreDiabetes with A1c 6.0% in 2012 and has had no symptoms of reactive hypoglycemia, diabetic polys, paresthesias or visual blurring.  Last A1c was  5.7% on 04/29/2014.   Further, the patient also has history of Vitamin D Deficiency of 39 in 2012 and supplements vitamin D without any suspected side-effects. Last vitamin D was 04/29/2014: VITD 73   Medication Sig  . Ascorbic Acid (VITAMIN C) 500 MG CAPS Take 1 capsule by mouth daily.  [name] Kitchen esomeprazole (NEXIUM) 40 MG capsule Take 1 capsule (40 mg total) by mouth daily before breakfast.  . gabapentin (NEURONTIN) 600 MG tablet Take 600 mg by mouth 3 (three) times daily.  . Magnesium 250 MG TABS Take 1 tablet by mouth daily.  . pravastatin (PRAVACHOL) 40 MG tablet take 1 tablet by mouth at bedtime FOR CHOLESTEROL.  [name] Kitchen ranitidine (ZANTAC) 300 MG tablet Take 1 tablet (300 mg total) by mouth 2 (two) times daily. For acid indigestion and reflux  . SYNTHROID 50 MCG tablet take 1 tablet by mouth once daily BEFORE BREAKFAST.  [name] Kitchen VITAMIN D Take 5,000 Int'l Units/day by mouth daily.  [name] Kitchen Respiratory Therapy Supplies (FLUTTER) DEVI Use as directed   No Known Allergies  PMHx:   Past Medical History  Diagnosis Date  . Hypertension   . Pre-diabetes   . Degenerative  joint disease   . GERD (gastroesophageal reflux disease)   . Hyperlipidemia   . Thyroid disease   . Migraines     neurontin helps  . Diabetes mellitus without complication    Immunization History  Administered Date(s) Administered  . Influenza, High Dose Seasonal PF 12/29/2013  . Influenza-Unspecified 01/24/2011, 02/02/2012  . Pneumococcal-Unspecified 11/28/2011  . Td 11/17/2010  . Zoster 11/17/2010   Past Surgical History  Procedure Laterality Date  . Ventral hernia repair    . Cholecystectomy  1996    open  . Pilonidal cyst excision  1972  . Tonsillectomy    . Colonoscopy    . Upper gi endoscopy     FHx:    Reviewed / unchanged  SHx:    Reviewed / unchanged  Systems Review:  Constitutional: Denies fever, chills, wt changes, headaches, insomnia, fatigue, night sweats, change in appetite. Eyes: Denies redness, blurred vision, diplopia, discharge, itchy, watery eyes.  ENT: Denies discharge, congestion, post nasal drip, epistaxis, sore throat, earache, hearing loss, dental pain, tinnitus, vertigo, sinus pain, snoring.  CV: Denies chest pain, palpitations, irregular heartbeat, syncope, dyspnea, diaphoresis, orthopnea, PND, claudication or edema. Respiratory: denies cough, dyspnea, DOE, pleurisy, hoarseness, laryngitis, wheezing.  Gastrointestinal: Denies dysphagia, odynophagia, heartburn, reflux, water brash, abdominal pain or cramps, nausea, vomiting, bloating, diarrhea, constipation, hematemesis, melena, hematochezia  or hemorrhoids. Genitourinary: Denies dysuria, frequency, urgency, nocturia, hesitancy, discharge, hematuria or flank pain. Musculoskeletal: c/o pains in hands, right  knee & ankle. Sees Dr Mina Marble for back pains.  Skin: Denies pruritus, rash, hives, warts, acne, eczema or change in skin lesion(s). Neuro: No weakness, tremor, incoordination, spasms, paresthesia or pain. Psychiatric: Denies confusion, memory loss or sensory loss. Endo: Denies change in weight, skin  or hair change.  Heme/Lymph: No excessive bleeding, bruising or enlarged lymph nodes.  Physical Exam  BP 134/80   Pulse 64  Temp 97.2 F   Resp 16  Ht 5' 3.75"   Wt 154 lb 12.8 oz     BMI 26.79   Appears well nourished and in no distress. Eyes: PERRLA, EOMs, conjunctiva no swelling or erythema. Sinuses: No frontal/maxillary tenderness ENT/Mouth: EAC's clear, TM's nl w/o erythema, bulging. Nares clear w/o erythema, swelling, exudates. Oropharynx clear without erythema or exudates. Oral hygiene is good. Tongue normal, non obstructing. Hearing intact.  Neck: Supple. Thyroid nl. Car 2+/2+ without bruits, nodes or JVD. Chest: Respirations nl with BS clear & equal w/o rales, rhonchi, wheezing or stridor.  Cor: Heart sounds normal w/ regular rate and rhythm without sig. murmurs, gallops, clicks, or rubs. Peripheral pulses normal and equal  without edema.  Abdomen: Soft & bowel sounds normal. Non-tender w/o guarding, rebound, hernias, masses, or organomegaly.  Lymphatics: Unremarkable.  Musculoskeletal: Full ROM all peripheral extremities, joint stability, 5/5 strength, and normal gait.  Skin: Warm, dry without exposed rashes, lesions or ecchymosis apparent.  Neuro: Cranial nerves intact, reflexes equal bilaterally. Sensory-motor testing grossly intact. Tendon reflexes grossly intact.  Pysch: Alert & oriented x 3.  Insight and judgement nl & appropriate. No ideations.  Assessment and Plan:  1. Essential hypertension  - TSH  2. Hyperlipidemia  - Lipid panel  3. Prediabetes  - Hemoglobin A1c - Insulin, random  4. Vitamin D deficiency  - Vit D  25 hydroxy (rtn osteoporosis monitoring)  5. Hypothyroidism, unspecified hypothyroidism type  - TSH  6. Medication management  - CBC with Differential/Platelet - BASIC METABOLIC PANEL WITH GFR - Hepatic function panel - Magnesium   Recommended regular exercise, BP monitoring, weight control, and discussed med and SE's. Recommended  labs to assess and monitor clinical status. Further disposition pending results of labs. Over 30 minutes of exam, counseling, chart review was performed

## 2014-07-30 LAB — VITAMIN D 25 HYDROXY (VIT D DEFICIENCY, FRACTURES): Vit D, 25-Hydroxy: 68 ng/mL (ref 30–100)

## 2014-07-30 LAB — INSULIN, RANDOM: Insulin: 4.4 u[IU]/mL (ref 2.0–19.6)

## 2014-09-10 ENCOUNTER — Other Ambulatory Visit: Payer: Self-pay | Admitting: Internal Medicine

## 2014-09-30 ENCOUNTER — Ambulatory Visit (INDEPENDENT_AMBULATORY_CARE_PROVIDER_SITE_OTHER): Payer: Medicare Other | Admitting: Internal Medicine

## 2014-09-30 ENCOUNTER — Encounter: Payer: Self-pay | Admitting: Internal Medicine

## 2014-09-30 VITALS — BP 128/80 | HR 72 | Temp 97.7°F | Resp 16 | Ht 64.0 in | Wt 152.0 lb

## 2014-09-30 DIAGNOSIS — K2970 Gastritis, unspecified, without bleeding: Secondary | ICD-10-CM

## 2014-09-30 DIAGNOSIS — R609 Edema, unspecified: Secondary | ICD-10-CM

## 2014-09-30 DIAGNOSIS — M25551 Pain in right hip: Secondary | ICD-10-CM

## 2014-09-30 MED ORDER — SUCRALFATE 1 G PO TABS: 1.0000 g | ORAL_TABLET | Freq: Four times a day (QID) | ORAL | Status: DC

## 2014-09-30 MED ORDER — FUROSEMIDE 20 MG PO TABS: 20.0000 mg | ORAL_TABLET | Freq: Every day | ORAL | Status: DC

## 2014-09-30 MED ORDER — PREDNISONE 20 MG PO TABS: ORAL_TABLET | ORAL | Status: DC

## 2014-09-30 NOTE — Patient Instructions (Signed)
Gastritis, Adult Gastritis is soreness and swelling (inflammation) of the lining of the stomach. Gastritis can develop as a sudden onset (acute) or long-term (chronic) condition. If gastritis is not treated, it can lead to stomach bleeding and ulcers. CAUSES  Gastritis occurs when the stomach lining is weak or damaged. Digestive juices from the stomach then inflame the weakened stomach lining. The stomach lining may be weak or damaged due to viral or bacterial infections. One common bacterial infection is the Helicobacter pylori infection. Gastritis can also result from excessive alcohol consumption, taking certain medicines, or having too much acid in the stomach.  SYMPTOMS  In some cases, there are no symptoms. When symptoms are present, they may include:  Pain or a burning sensation in the upper abdomen.  Nausea.  Vomiting.  An uncomfortable feeling of fullness after eating. DIAGNOSIS  Your caregiver may suspect you have gastritis based on your symptoms and a physical exam. To determine the cause of your gastritis, your caregiver may perform the following:  Blood or stool tests to check for the H pylori bacterium.  Gastroscopy. A thin, flexible tube (endoscope) is passed down the esophagus and into the stomach. The endoscope has a light and camera on the end. Your caregiver uses the endoscope to view the inside of the stomach.  Taking a tissue sample (biopsy) from the stomach to examine under a microscope. TREATMENT  Depending on the cause of your gastritis, medicines may be prescribed. If you have a bacterial infection, such as an H pylori infection, antibiotics may be given. If your gastritis is caused by too much acid in the stomach, H2 blockers or antacids may be given. Your caregiver may recommend that you stop taking aspirin, ibuprofen, or other nonsteroidal anti-inflammatory drugs (NSAIDs). HOME CARE INSTRUCTIONS  Only take over-the-counter or prescription medicines as directed by  your caregiver.  If you were given antibiotic medicines, take them as directed. Finish them even if you start to feel better.  Drink enough fluids to keep your urine clear or pale yellow.  Avoid foods and drinks that make your symptoms worse, such as:  Caffeine or alcoholic drinks.  Chocolate.  Peppermint or mint flavorings.  Garlic and onions.  Spicy foods.  Citrus fruits, such as oranges, lemons, or limes.  Tomato-based foods such as sauce, chili, salsa, and pizza.  Fried and fatty foods.  Eat small, frequent meals instead of large meals. SEEK IMMEDIATE MEDICAL CARE IF:   You have black or dark red stools.  You vomit blood or material that looks like coffee grounds.  You are unable to keep fluids down.  Your abdominal pain gets worse.  You have a fever.  You do not feel better after 1 week.  You have any other questions or concerns. MAKE SURE YOU:  Understand these instructions.  Will watch your condition.  Will get help right away if you are not doing well or get worse. Document Released: 03/28/2001 Document Revised: 10/03/2011 Document Reviewed: 05/17/2011 [facility] Patient Information 2015 [place], [place]. This information is not intended to replace advice given to you by your health care provider. Make sure you discuss any questions you have with your health care provider.  Peripheral Edema You have swelling in your legs (peripheral edema). This swelling is due to excess accumulation of salt and water in your body. Edema may be a sign of heart, kidney or liver disease, or a side effect of a medication. It may also be due to problems in the leg veins. Elevating  your legs and using special support stockings may be very helpful, if the cause of the swelling is due to poor venous circulation. Avoid long periods of standing, whatever the cause. Treatment of edema depends on identifying the cause. Chips, pretzels, pickles and other salty foods should be avoided.  Restricting salt in your diet is almost always needed. Water pills (diuretics) are often used to remove the excess salt and water from your body via urine. These medicines prevent the kidney from reabsorbing sodium. This increases urine flow. Diuretic treatment may also result in lowering of potassium levels in your body. Potassium supplements may be needed if you have to use diuretics daily. Daily weights can help you keep track of your progress in clearing your edema. You should call your caregiver for follow up care as recommended. SEEK IMMEDIATE MEDICAL CARE IF:   You have increased swelling, pain, redness, or heat in your legs.  You develop shortness of breath, especially when lying down.  You develop chest or abdominal pain, weakness, or fainting.  You have a fever. Document Released: 05/11/2004 Document Revised: 06/26/2011 Document Reviewed: 04/21/2009 [facility] Patient Information 2015 [place], [place]. This information is not intended to replace advice given to you by your health care provider. Make sure you discuss any questions you have with your health care provider.

## 2014-09-30 NOTE — Progress Notes (Signed)
Subjective:    Patient ID: Jillian Rodgers, female    DOB: 12-27-1945, 69 y.o.   MRN: [id number]  HPI  Patient presents to the office for evaluation of right hip pain and groin pain.  She reports that this has been going on intermittently for some time. She did recently take a 2 week vacation and has been in a car.  She reports that she did get up every couple hours and walk around and use the restroom.     She also reports that she is having some epigastric pain which has been going on for some time.  She has a history of severe reflux.  She reports that the pain is her stomach is not as bad as when she has had reflux in the past.  She is still taking nexium.  She is taking zantac as well.     She also reports that she has some feet swelling.  She reports that this has been going on form some time.  She was on a fluid pill for a while but she was taken off of that.  She reports that she has been recently driving for the last two weeks.  Her right leg has been swelling more so than her left leg.  She reports that she is not having any pains in her calf.     Review of Systems  Constitutional: Negative for fever, chills and fatigue.  Respiratory: Negative for cough, chest tightness and shortness of breath.   Cardiovascular: Positive for leg swelling. Negative for chest pain and palpitations.  Gastrointestinal: Positive for abdominal pain. Negative for nausea, vomiting, diarrhea, constipation and blood in stool.  Genitourinary: Negative for dysuria, urgency, frequency and difficulty urinating.  Musculoskeletal: Positive for arthralgias.  Neurological: Negative for numbness.       Objective:   Physical Exam  Constitutional: She is oriented to person, place, and time. She appears well-developed and well-nourished. No distress.  HENT:  Head: Normocephalic and atraumatic.  Mouth/Throat: Oropharynx is clear and moist. No oropharyngeal exudate.  Eyes: Conjunctivae are normal. No  scleral icterus.  Neck: Normal range of motion. Neck supple. No JVD present. No thyromegaly present.  Cardiovascular: Normal rate, regular rhythm, normal heart sounds and intact distal pulses.  Exam reveals no gallop and no friction rub.   No murmur heard. Negative holmans sign bilaterally, negative femoral vein tenderness.  Varicose veins bilaterally.  Pulmonary/Chest: Effort normal and breath sounds normal. No respiratory distress. She has no wheezes. She has no rales. She exhibits no tenderness.  Abdominal: Soft. Bowel sounds are normal. She exhibits no distension and no mass. There is tenderness in the epigastric area. There is no rigidity, no rebound, no guarding, no CVA tenderness, no tenderness at McBurney's point and negative Murphy's sign.  Musculoskeletal:       Right hip: She exhibits normal range of motion, normal strength, no tenderness, no bony tenderness, no swelling, no crepitus, no deformity and no laceration.       Legs: Irritability with internal and external hip rotation.    Lymphadenopathy:    She has no cervical adenopathy.  Neurological: She is alert and oriented to person, place, and time.  Skin: Skin is warm and dry. She is not diaphoretic.  Psychiatric: She has a normal mood and affect. Her behavior is normal. Judgment and thought content normal.  Nursing note and vitals reviewed.         Assessment & Plan:    1. Gastritis -cont  nexium and zantac -history and physical most consistent with GERD vs. Gastritis.  Less likely pancreatitis and cholelithiasis/cholecystis -try sucralfate.  If no improvement labs and ruq ultrasound  2. Right hip pain -exam most consistent with trochanteric bursitis plus right hip OA -no femoral vein tenderness -doubt DVT.  Only risk factor is recent travel however patient got up frequently.  Leg swelling was symmetric bilaterally.  Well's Score 0.  Will treat with prednisone  3. Peripheral edema -doubt DVT.  Only risk factor is  recent travel however patient got up frequently.  Leg swelling was symmetric bilaterally.  Well's Score 0.  Will treat with 20 lasix prn -elevate legs -avoid salt -compression stockings. -recheck BMP in 2 weeks for lab only visit

## 2014-10-09 ENCOUNTER — Ambulatory Visit (INDEPENDENT_AMBULATORY_CARE_PROVIDER_SITE_OTHER): Payer: Medicare Other

## 2014-10-09 DIAGNOSIS — R609 Edema, unspecified: Secondary | ICD-10-CM

## 2014-10-09 LAB — BASIC METABOLIC PANEL WITH GFR
BUN: 35 mg/dL — ABNORMAL HIGH (ref 6–23)
CO2: 29 mEq/L (ref 19–32)
Calcium: 9.9 mg/dL (ref 8.4–10.5)
Chloride: 100 mEq/L (ref 96–112)
Creat: 1.04 mg/dL (ref 0.50–1.10)
GFR, Est African American: 63 mL/min
GFR, Est Non African American: 55 mL/min — ABNORMAL LOW
Glucose, Bld: 66 mg/dL — ABNORMAL LOW (ref 70–99)
Potassium: 4 mEq/L (ref 3.5–5.3)
Sodium: 142 mEq/L (ref 135–145)

## 2014-10-28 ENCOUNTER — Encounter: Payer: Self-pay | Admitting: Internal Medicine

## 2014-10-28 ENCOUNTER — Ambulatory Visit (INDEPENDENT_AMBULATORY_CARE_PROVIDER_SITE_OTHER): Payer: Medicare Other | Admitting: Internal Medicine

## 2014-10-28 VITALS — BP 104/70 | HR 68 | Temp 98.2°F | Resp 18 | Ht 64.0 in | Wt 153.0 lb

## 2014-10-28 DIAGNOSIS — N183 Chronic kidney disease, stage 3 unspecified: Secondary | ICD-10-CM

## 2014-10-28 DIAGNOSIS — M1990 Unspecified osteoarthritis, unspecified site: Secondary | ICD-10-CM

## 2014-10-28 DIAGNOSIS — K219 Gastro-esophageal reflux disease without esophagitis: Secondary | ICD-10-CM

## 2014-10-28 DIAGNOSIS — E039 Hypothyroidism, unspecified: Secondary | ICD-10-CM

## 2014-10-28 DIAGNOSIS — Z23 Encounter for immunization: Secondary | ICD-10-CM

## 2014-10-28 DIAGNOSIS — R7309 Other abnormal glucose: Secondary | ICD-10-CM

## 2014-10-28 DIAGNOSIS — I10 Essential (primary) hypertension: Secondary | ICD-10-CM

## 2014-10-28 DIAGNOSIS — Z9181 History of falling: Secondary | ICD-10-CM

## 2014-10-28 DIAGNOSIS — E785 Hyperlipidemia, unspecified: Secondary | ICD-10-CM

## 2014-10-28 DIAGNOSIS — Z0000 Encounter for general adult medical examination without abnormal findings: Secondary | ICD-10-CM

## 2014-10-28 DIAGNOSIS — IMO0001 Reserved for inherently not codable concepts without codable children: Secondary | ICD-10-CM

## 2014-10-28 DIAGNOSIS — R6889 Other general symptoms and signs: Secondary | ICD-10-CM

## 2014-10-28 DIAGNOSIS — Z0001 Encounter for general adult medical examination with abnormal findings: Secondary | ICD-10-CM

## 2014-10-28 DIAGNOSIS — R7303 Prediabetes: Secondary | ICD-10-CM

## 2014-10-28 DIAGNOSIS — Z1331 Encounter for screening for depression: Secondary | ICD-10-CM

## 2014-10-28 DIAGNOSIS — Z79899 Other long term (current) drug therapy: Secondary | ICD-10-CM

## 2014-10-28 DIAGNOSIS — E559 Vitamin D deficiency, unspecified: Secondary | ICD-10-CM

## 2014-10-28 LAB — LIPID PANEL
Cholesterol: 157 mg/dL (ref 0–200)
HDL: 71 mg/dL (ref >=46)
LDL Cholesterol: 65 mg/dL (ref 0–99)
Total CHOL/HDL Ratio: 2.2 Ratio
Triglycerides: 107 mg/dL (ref <150)
VLDL: 21 mg/dL (ref 0–40)

## 2014-10-28 LAB — HEPATIC FUNCTION PANEL
ALT: 12 U/L (ref 0–35)
AST: 16 U/L (ref 0–37)
Albumin: 3.7 g/dL (ref 3.5–5.2)
Alkaline Phosphatase: 75 U/L (ref 39–117)
Bilirubin, Direct: 0.1 mg/dL (ref 0.0–0.3)
Indirect Bilirubin: 0.5 mg/dL (ref 0.2–1.2)
Total Bilirubin: 0.6 mg/dL (ref 0.2–1.2)
Total Protein: 6 g/dL (ref 6.0–8.3)

## 2014-10-28 LAB — BASIC METABOLIC PANEL WITH GFR
BUN: 29 mg/dL — ABNORMAL HIGH (ref 6–23)
CO2: 30 mEq/L (ref 19–32)
Calcium: 9.7 mg/dL (ref 8.4–10.5)
Chloride: 102 mEq/L (ref 96–112)
Creat: 1.22 mg/dL — ABNORMAL HIGH (ref 0.50–1.10)
GFR, Est African American: 52 mL/min — ABNORMAL LOW
GFR, Est Non African American: 45 mL/min — ABNORMAL LOW
Glucose, Bld: 77 mg/dL (ref 70–99)
Potassium: 4 mEq/L (ref 3.5–5.3)
Sodium: 142 mEq/L (ref 135–145)

## 2014-10-28 LAB — CBC WITH DIFFERENTIAL/PLATELET
Basophils Absolute: 0 10*3/uL (ref 0.0–0.1)
Basophils Relative: 0 % (ref 0–1)
Eosinophils Absolute: 0.5 10*3/uL (ref 0.0–0.7)
Eosinophils Relative: 7 % — ABNORMAL HIGH (ref 0–5)
HCT: 37.6 % (ref 36.0–46.0)
Hemoglobin: 12.3 g/dL (ref 12.0–15.0)
Lymphocytes Relative: 21 % (ref 12–46)
Lymphs Abs: 1.5 10*3/uL (ref 0.7–4.0)
MCH: 29.1 pg (ref 26.0–34.0)
MCHC: 32.7 g/dL (ref 30.0–36.0)
MCV: 88.9 fL (ref 78.0–100.0)
MPV: 9.6 fL (ref 8.6–12.4)
Monocytes Absolute: 0.6 10*3/uL (ref 0.1–1.0)
Monocytes Relative: 9 % (ref 3–12)
Neutro Abs: 4.4 10*3/uL (ref 1.7–7.7)
Neutrophils Relative %: 63 % (ref 43–77)
Platelets: 154 10*3/uL (ref 150–400)
RBC: 4.23 MIL/uL (ref 3.87–5.11)
RDW: 13.7 % (ref 11.5–15.5)
WBC: 7 10*3/uL (ref 4.0–10.5)

## 2014-10-28 LAB — HEMOGLOBIN A1C
Hgb A1c MFr Bld: 5.9 % — ABNORMAL HIGH (ref <5.7)
Mean Plasma Glucose: 123 mg/dL — ABNORMAL HIGH (ref <117)

## 2014-10-28 LAB — MAGNESIUM: Magnesium: 2 mg/dL (ref 1.5–2.5)

## 2014-10-28 LAB — TSH: TSH: 2.492 u[IU]/mL (ref 0.350–4.500)

## 2014-10-28 NOTE — Progress Notes (Signed)
MEDICARE ANNUAL WELLNESS VISIT AND FOLLOW UP  Assessment:    1. Essential hypertension -monitor at home -DASH diet - TSH  2. Hypothyroidism, unspecified hypothyroidism type -TSH  3. Prediabetes - Hemoglobin A1c - Insulin, random  4. CKD Stage 3 (GFR 36 ml/min) -hydrate and avoid NSAIDs  5. Hyperlipidemia -cont meds - Lipid panel  6. Vitamin D deficiency -cont supplement - Vit D  25 hydroxy (rtn osteoporosis monitoring)  7. Medication management  - CBC with Differential/Platelet - BASIC METABOLIC PANEL WITH GFR - Hepatic function panel - Magnesium  8. Need for prophylactic vaccination against Streptococcus pneumoniae (pneumococcus)  - Pneumococcal conjugate vaccine 13-valent  9. Gastroesophageal reflux disease, esophagitis presence not specified -carafate prn  10. Osteoarthritis, unspecified osteoarthritis type, unspecified site -followed by ortho    Over 30 minutes of exam, counseling, chart review, and critical decision making was performed  Plan:   During the course of the visit the patient was educated and counseled about appropriate screening and preventive services including:    Pneumococcal vaccine   Influenza vaccine  Td vaccine  Prevnar 13  Screening electrocardiogram  Screening mammography  Bone densitometry screening  Colorectal cancer screening  Diabetes screening  Glaucoma screening  Nutrition counseling   Advanced directives: given info/requested copies  Conditions/risks identified: Diabetes is at goal, ACE/ARB therapy: Yes. Urinary Incontinence is not an issue: discussed non pharmacology and pharmacology options.  Fall risk: low- discussed PT, home fall assessment, medications.    Subjective:   Jillian Rodgers is a 69 y.o. female who presents for Medicare Annual Wellness Visit and 3 month follow up on hypertension, prediabetes, hyperlipidemia, vitamin D def.  Date of last medicare wellness visit is  unknown.   Her blood pressure has been controlled at home, today their BP is BP: 104/70 mmHg She does workout.  She does a lot of walking.   She denies chest pain, shortness of breath, dizziness.  She is on cholesterol medication and denies myalgias. Her cholesterol is at goal. The cholesterol last visit was:   Lab Results  Component Value Date   CHOL 150 07/29/2014   HDL 75 07/29/2014   LDLCALC 57 07/29/2014   TRIG 90 07/29/2014   CHOLHDL 2.0 07/29/2014   She has been working on diet and exercise for prediabetes, and denies foot ulcerations, hyperglycemia, hypoglycemia , increased appetite, nausea, paresthesia of the feet, polydipsia, polyuria, visual disturbances, vomiting and weight loss. Last A1C in the office was:  Lab Results  Component Value Date   HGBA1C 5.6 07/29/2014  She has been eating mostly seafood when she eats meat.  She reports that she has been cutting out sodas.  She is eating a lot of fruits and veggies.  She has been decreasing breads and pasta.    Patient is on Vitamin D supplement. Lab Results  Component Value Date   VD25OH 68 07/29/2014     Patient reports that she was seen by ortho and gyn.  She reports that she was having some right hip pain which resolved after having an injection in the right hip, but ortho felt that the pain may have been related to gyn issues.  Pelvic ultrasound showed some free fluid.  She does go back in 2 months.    She does report the occasionally have some central thoracic spine pain which can be moderate. She reports that it sometimes bothers her with no triggers.  It is not currently bothering.    Medication Review Current Outpatient Prescriptions on  File Prior to Visit  Medication Sig Dispense Refill  . Ascorbic Acid (VITAMIN C) 500 MG CAPS Take 1 capsule by mouth daily.    [name] Kitchen esomeprazole (NEXIUM) 40 MG capsule Take 1 capsule (40 mg total) by mouth daily before breakfast. 30 capsule 5  . furosemide (LASIX) 20 MG tablet Take 1  tablet (20 mg total) by mouth daily. 30 tablet 2  . gabapentin (NEURONTIN) 600 MG tablet Take 600 mg by mouth 4 (four) times daily.     . Magnesium 250 MG TABS Take 1 tablet by mouth daily.    [name] Kitchen OVER THE COUNTER MEDICATION Taking Tangy Tangerine tabs 2.0 # 2 tabs daily.    . pravastatin (PRAVACHOL) 40 MG tablet take 1 tablet by mouth at bedtime FOR CHOLESTEROL. 90 tablet 99  . ranitidine (ZANTAC) 300 MG tablet Take 1 tablet (300 mg total) by mouth 2 (two) times daily. For acid indigestion and reflux 180 tablet 99  . SYNTHROID 50 MCG tablet take 1 tablet by mouth once daily BEFORE BREAKFAST. 30 tablet 6  . VITAMIN D, ERGOCALCIFEROL, PO Take 5,000 Int'l Units/day by mouth daily.     No current facility-administered medications on file prior to visit.    Current Problems (verified) Patient Active Problem List   Diagnosis Date Noted  . CKD Stage 3 (GFR 36 ml/min) 01/19/2014  . Pneumonia, organism unspecified 12/17/2013  . Upper airway cough syndrome 12/16/2013  . Vitamin D deficiency 05/21/2013  . Prediabetes 05/21/2013  . Medication management 05/21/2013  . Essential hypertension 01/31/2013  . Degenerative joint disease   . GERD (gastroesophageal reflux disease)   . Hyperlipidemia   . Hypothyroidism     Screening Tests Immunization History  Administered Date(s) Administered  . Influenza, High Dose Seasonal PF 12/29/2013  . Influenza-Unspecified 01/24/2011, 02/02/2012  . Pneumococcal-Unspecified 11/28/2011  . Td 11/17/2010  . Zoster 11/17/2010    Preventative care: Last colonoscopy: 2009 Last mammogram: 2015 Last pap smear/pelvic exam: 2016   [id number]  Prior vaccinations: TD or Tdap: 2012  Influenza: 2014  Pneumococcal: 2013 Prevnar13: Today Shingles/Zostavax: 2012  Names of Other Physician/Practitioners you currently use: 1. [facility]- here for primary care 2. Dr. Gershon Crane, eye doctor, last visit 2015 3. Dr. Mauro Kaufmann, dentist,  last visit 2016 Patient Care Team: Unk Pinto, MD as PCP - General (Internal Medicine)  Past Surgical History  Procedure Laterality Date  . Ventral hernia repair    . Cholecystectomy  1996    open  . Pilonidal cyst excision  1972  . Tonsillectomy    . Colonoscopy    . Upper gi endoscopy     Family History  Problem Relation Age of Onset  . Ovarian cancer Mother   . Breast cancer Sister   . Thyroid cancer Sister   . Stroke Paternal Uncle    History  Substance Use Topics  . Smoking status: Never Smoker   . Smokeless tobacco: Never Used  . Alcohol Use: No    MEDICARE WELLNESS OBJECTIVES: Tobacco use: She does not smoke.  Patient is not a former smoker. If yes, counseling given Alcohol Current alcohol use: none Osteoporosis: postmenopausal estrogen deficiency, dietary calcium and/or vitamin D deficiency and amenorrhea, History of fracture in the past year: no Fall risk: Low Risk Hearing: normal Visual acuity: normal,  does perform annual eye exam Diet: in general, a "healthy" diet   Physical activity:   Cardiac risk factors:   Depression/mood screen:   Depression screen [facility] 2/9 09/04/2013  Decreased Interest 0  Down, Depressed, Hopeless 0  PHQ - 2 Score 0    ADLs:  No flowsheet data found.   Cognitive Testing  Alert? Yes  Normal Appearance?Yes  Oriented to person? Yes  Place? Yes   Time? Yes  Recall of three objects?  Yes  Can perform simple calculations? Yes  Displays appropriate judgment?Yes  Can read the correct time from a watch face?Yes  EOL planning:     Objective:   Today's Vitals   10/28/14 1050  BP: 104/70  Pulse: 68  Temp: 98.2 F (36.8 C)  TempSrc: Temporal  Resp: 18  Height: 5\' 4"  (1.626 m)  Weight: 153 lb (69.4 kg)   Body mass index is 26.25 kg/(m^2).  General appearance: alert, no distress, WD/WN,  female HEENT: normocephalic, sclerae anicteric, TMs pearly, nares patent, no discharge or erythema, pharynx normal Oral cavity: MMM,  no lesions Neck: supple, no lymphadenopathy, no thyromegaly, no masses Heart: RRR, normal S1, S2, no murmurs Lungs: CTA bilaterally, no wheezes, rhonchi, or rales Abdomen: +bs, soft, non tender, non distended, no masses, no hepatomegaly, no splenomegaly Musculoskeletal: nontender, no swelling, no obvious deformity Extremities: no edema, no cyanosis, no clubbing Pulses: 2+ symmetric, upper and lower extremities, normal cap refill Neurological: alert, oriented x 3, CN2-12 intact, strength normal upper extremities and lower extremities, sensation normal throughout, DTRs 2+ throughout, no cerebellar signs, gait normal Psychiatric: normal affect, behavior normal, pleasant  Breast: defer Gyn: defer Rectal: defer   Medicare Attestation I have personally reviewed: The patient's medical and social history Their use of alcohol, tobacco or illicit drugs Their current medications and supplements The patient's functional ability including ADLs,fall risks, home safety risks, cognitive, and hearing and visual impairment Diet and physical activities Evidence for depression or mood disorders  The patient's weight, height, BMI, and visual acuity have been recorded in the chart.  I have made referrals, counseling, and provided education to the patient based on review of the above and I have provided the patient with a written personalized care plan for preventive services.     Starlyn Skeans, PA-C   10/28/2014

## 2014-10-28 NOTE — Patient Instructions (Signed)

## 2014-10-29 LAB — INSULIN, RANDOM: Insulin: 4.1 u[IU]/mL (ref 2.0–19.6)

## 2014-10-29 LAB — VITAMIN D 25 HYDROXY (VIT D DEFICIENCY, FRACTURES): Vit D, 25-Hydroxy: 66 ng/mL (ref 30–100)

## 2014-10-31 ENCOUNTER — Other Ambulatory Visit: Payer: Self-pay | Admitting: Physician Assistant

## 2014-11-12 ENCOUNTER — Ambulatory Visit (INDEPENDENT_AMBULATORY_CARE_PROVIDER_SITE_OTHER): Payer: Medicare Other | Admitting: Physician Assistant

## 2014-11-12 ENCOUNTER — Encounter: Payer: Self-pay | Admitting: Physician Assistant

## 2014-11-12 VITALS — BP 116/72 | HR 70 | Temp 98.0°F | Resp 18 | Ht 64.0 in | Wt 151.0 lb

## 2014-11-12 DIAGNOSIS — R05 Cough: Secondary | ICD-10-CM

## 2014-11-12 DIAGNOSIS — R059 Cough, unspecified: Secondary | ICD-10-CM

## 2014-11-12 DIAGNOSIS — Z23 Encounter for immunization: Secondary | ICD-10-CM

## 2014-11-12 MED ORDER — DOXYCYCLINE HYCLATE 100 MG PO TABS: 100.0000 mg | ORAL_TABLET | Freq: Two times a day (BID) | ORAL | Status: DC

## 2014-11-12 MED ORDER — PREDNISONE 20 MG PO TABS: ORAL_TABLET | ORAL | Status: DC

## 2014-11-12 NOTE — Progress Notes (Signed)
Subjective:    Patient ID: Jillian Rodgers, female    DOB: [birth year], 68 y.o.   MRN: [id number]  HPI 69 y.o. WF with history of HTN, GERD, preDM, hypothyroidism, CKD, chol and a upper airway cough syndrome presents with cough. Intermittent barking cough x 2 weeks, nonproductive, has some pain/SOB with coughing only. Mild reflux, was on amoxicillin x 10 days due to abscess tooth.  Denies fever or chills, CP, wheezing. Has new grandson and is concerned about getting him sick.   Blood pressure 116/72, pulse 70, temperature 98 F (36.7 C), temperature source Temporal, resp. rate 18, height 5\' 4"  (1.626 m), weight 151 lb (68.493 kg), SpO2 96 %.   Current Outpatient Prescriptions on File Prior to Visit  Medication Sig Dispense Refill  . Ascorbic Acid (VITAMIN C) 500 MG CAPS Take 1 capsule by mouth daily.    [name] Kitchen esomeprazole (NEXIUM) 40 MG capsule take 1 capsule by mouth once daily BEFORE BREAKFAST 30 capsule 5  . furosemide (LASIX) 20 MG tablet Take 1 tablet (20 mg total) by mouth daily. 30 tablet 2  . gabapentin (NEURONTIN) 600 MG tablet Take 600 mg by mouth 4 (four) times daily.     . Magnesium 250 MG TABS Take 1 tablet by mouth daily.    [name] Kitchen OVER THE COUNTER MEDICATION Taking Tangy Tangerine tabs 2.0 # 2 tabs daily.    . pravastatin (PRAVACHOL) 40 MG tablet take 1 tablet by mouth at bedtime FOR CHOLESTEROL. 90 tablet 99  . ranitidine (ZANTAC) 300 MG tablet Take 1 tablet (300 mg total) by mouth 2 (two) times daily. For acid indigestion and reflux 180 tablet 99  . SYNTHROID 50 MCG tablet take 1 tablet by mouth once daily BEFORE BREAKFAST. 30 tablet 6  . VITAMIN D, ERGOCALCIFEROL, PO Take 5,000 Int'l Units/day by mouth daily.     No current facility-administered medications on file prior to visit.   Past Medical History  Diagnosis Date  . Hypertension   . Pre-diabetes   . Degenerative joint disease   . GERD (gastroesophageal reflux disease)   . Hyperlipidemia   . Thyroid disease    . Migraines     neurontin helps  . Diabetes mellitus without complication    Review of Systems  Constitutional: Negative.   HENT: Negative.   Respiratory: Positive for cough. Negative for apnea, choking, chest tightness, shortness of breath, wheezing and stridor.   Cardiovascular: Negative.   Gastrointestinal: Negative.        + GERD  Genitourinary: Negative.       Objective:   Physical Exam  Constitutional: She appears well-developed and well-nourished.  HENT:  Head: Normocephalic and atraumatic.  Right Ear: External ear normal.  Nose: Right sinus exhibits maxillary sinus tenderness. Right sinus exhibits no frontal sinus tenderness. Left sinus exhibits maxillary sinus tenderness. Left sinus exhibits no frontal sinus tenderness.  Eyes: Conjunctivae and EOM are normal.  Neck: Normal range of motion. Neck supple.  Cardiovascular: Normal rate, regular rhythm, normal heart sounds and intact distal pulses.   Pulmonary/Chest: Effort normal and breath sounds normal. No respiratory distress. She has no wheezes.  Abdominal: Soft. Bowel sounds are normal. She exhibits no distension and no mass. There is tenderness (epigastric). There is no rebound and no guarding.  Lymphadenopathy:    She has no cervical adenopathy.  Skin: Skin is warm and dry.      Assessment & Plan:  Cough- will give TDAP due to new grandson, lungs CTAB with +  sinus tenderness, will give prednisone, ABX to hold onto, and start on PPI x 2 weeks since she has epigastric tenderness.

## 2014-11-12 NOTE — Patient Instructions (Addendum)
Common causes of cough OR hoarseness OR sore throat:   Will give low dose prednisone Will given PPI sample to take for 2 weeks Will give doxycycline    Allergies, Viral Infections, Acid Reflux and Bacterial Infections.  1) Allergies and viral infections cause a cough OR sore throat by post nasal drip and are often worse at night, can also have sneezing, lower grade fevers, clear/yellow mucus. This is best treated with allergy medications or nasal sprays.  Please get on allegra for 1-2 weeks The strongest is allegra or fexafinadine  Cheapest at [employer], [employer], [employer]  2) Bacterial infections are more severe than allergies or viral infections with fever, teeth pain, fatigue. This can be treated with prednisone and the same over the counter medication and after 7 days can be treated with an antibiotic.   3) Silent reflux/GERD can cause a cough OR sore throat OR hoarseness WITHOUT heart burn because the esophagus that goes to the stomach and trachea that goes to the lungs are very close and when you lay down the acid can irritate your throat and lungs. This can cause hoarseness, cough, and wheezing. Please stop any alcohol or anti-inflammatories like aleve/advil/ibuprofen and start an over the counter Prilosec or omeprazole 1-2 times daily 63mins before food for 2 weeks, then switch to over the counter zantac/ratinidine or pepcid/famotadine once at night for 2 weeks.   4) sometimes irritation causes more irritation. Try voice rest, use sugar free cough drops to prevent coughing, and try to stop clearing your throat.   If you ever have a cough that does not go away after trying these things please make a follow up visit for further evaluation or we can refer you to a specialist. Or if you ever have shortness of breath or chest pain go to the ER.      Food Choices for Gastroesophageal Reflux Disease When you have gastroesophageal reflux disease (GERD), the foods you eat and your eating habits are  very important. Choosing the right foods can help ease the discomfort of GERD. WHAT GENERAL GUIDELINES DO I NEED TO FOLLOW?  Choose fruits, vegetables, whole grains, low-fat dairy products, and low-fat meat, fish, and poultry.  Limit fats such as oils, salad dressings, butter, nuts, and avocado.  Keep a food diary to identify foods that cause symptoms.  Avoid foods that cause reflux. These may be different for different people.  Eat frequent small meals instead of three large meals each day.  Eat your meals slowly, in a relaxed setting.  Limit fried foods.  Cook foods using methods other than frying.  Avoid drinking alcohol.  Avoid drinking large amounts of liquids with your meals.  Avoid bending over or lying down until 2-3 hours after eating. WHAT FOODS ARE NOT RECOMMENDED? The following are some foods and drinks that may worsen your symptoms: Vegetables Tomatoes. Tomato juice. Tomato and spaghetti sauce. Chili peppers. Onion and garlic. Horseradish. Fruits Oranges, grapefruit, and lemon (fruit and juice). Meats High-fat meats, fish, and poultry. This includes hot dogs, ribs, ham, sausage, salami, and bacon. Dairy Whole milk and chocolate milk. Sour cream. Cream. Butter. Ice cream. Cream cheese.  Beverages Coffee and tea, with or without caffeine. Carbonated beverages or energy drinks. Condiments Hot sauce. Barbecue sauce.  Sweets/Desserts Chocolate and cocoa. Donuts. Peppermint and spearmint. Fats and Oils High-fat foods, including [place] fries and potato chips. Other Vinegar. Strong spices, such as black pepper, white pepper, red pepper, cayenne, curry powder, cloves, ginger, and chili powder.

## 2014-11-13 LAB — CBC WITH DIFFERENTIAL/PLATELET
Basophils Absolute: 0.1 10*3/uL (ref 0.0–0.1)
Basophils Relative: 1 % (ref 0–1)
Eosinophils Absolute: 0.3 10*3/uL (ref 0.0–0.7)
Eosinophils Relative: 4 % (ref 0–5)
HCT: 38.4 % (ref 36.0–46.0)
Hemoglobin: 13.1 g/dL (ref 12.0–15.0)
Lymphocytes Relative: 23 % (ref 12–46)
Lymphs Abs: 1.7 10*3/uL (ref 0.7–4.0)
MCH: 30.5 pg (ref 26.0–34.0)
MCHC: 34.1 g/dL (ref 30.0–36.0)
MCV: 89.3 fL (ref 78.0–100.0)
MPV: 9.9 fL (ref 8.6–12.4)
Monocytes Absolute: 0.7 10*3/uL (ref 0.1–1.0)
Monocytes Relative: 10 % (ref 3–12)
Neutro Abs: 4.5 10*3/uL (ref 1.7–7.7)
Neutrophils Relative %: 62 % (ref 43–77)
Platelets: 128 10*3/uL — ABNORMAL LOW (ref 150–400)
RBC: 4.3 MIL/uL (ref 3.87–5.11)
RDW: 14.2 % (ref 11.5–15.5)
WBC: 7.3 10*3/uL (ref 4.0–10.5)

## 2014-11-28 ENCOUNTER — Other Ambulatory Visit: Payer: Self-pay | Admitting: Internal Medicine

## 2015-01-15 ENCOUNTER — Ambulatory Visit (INDEPENDENT_AMBULATORY_CARE_PROVIDER_SITE_OTHER): Payer: Medicare Other | Admitting: *Deleted

## 2015-01-15 DIAGNOSIS — Z23 Encounter for immunization: Secondary | ICD-10-CM

## 2015-01-28 ENCOUNTER — Ambulatory Visit (INDEPENDENT_AMBULATORY_CARE_PROVIDER_SITE_OTHER): Payer: Medicare Other | Admitting: Internal Medicine

## 2015-01-28 ENCOUNTER — Encounter: Payer: Self-pay | Admitting: Internal Medicine

## 2015-01-28 VITALS — BP 122/70 | HR 68 | Temp 97.3°F | Resp 16 | Ht 63.75 in | Wt 154.4 lb

## 2015-01-28 DIAGNOSIS — Z79899 Other long term (current) drug therapy: Secondary | ICD-10-CM | POA: Diagnosis not present

## 2015-01-28 DIAGNOSIS — E039 Hypothyroidism, unspecified: Secondary | ICD-10-CM | POA: Diagnosis not present

## 2015-01-28 DIAGNOSIS — Z789 Other specified health status: Secondary | ICD-10-CM | POA: Diagnosis not present

## 2015-01-28 DIAGNOSIS — Z1212 Encounter for screening for malignant neoplasm of rectum: Secondary | ICD-10-CM

## 2015-01-28 DIAGNOSIS — Z9181 History of falling: Secondary | ICD-10-CM

## 2015-01-28 DIAGNOSIS — I10 Essential (primary) hypertension: Secondary | ICD-10-CM | POA: Diagnosis not present

## 2015-01-28 DIAGNOSIS — N183 Chronic kidney disease, stage 3 unspecified: Secondary | ICD-10-CM

## 2015-01-28 DIAGNOSIS — R7303 Prediabetes: Secondary | ICD-10-CM | POA: Diagnosis not present

## 2015-01-28 DIAGNOSIS — Z1331 Encounter for screening for depression: Secondary | ICD-10-CM

## 2015-01-28 DIAGNOSIS — K219 Gastro-esophageal reflux disease without esophagitis: Secondary | ICD-10-CM

## 2015-01-28 DIAGNOSIS — D519 Vitamin B12 deficiency anemia, unspecified: Secondary | ICD-10-CM

## 2015-01-28 DIAGNOSIS — Z1389 Encounter for screening for other disorder: Secondary | ICD-10-CM

## 2015-01-28 DIAGNOSIS — E559 Vitamin D deficiency, unspecified: Secondary | ICD-10-CM

## 2015-01-28 DIAGNOSIS — E785 Hyperlipidemia, unspecified: Secondary | ICD-10-CM | POA: Diagnosis not present

## 2015-01-28 DIAGNOSIS — D509 Iron deficiency anemia, unspecified: Secondary | ICD-10-CM

## 2015-01-28 DIAGNOSIS — E663 Overweight: Secondary | ICD-10-CM | POA: Insufficient documentation

## 2015-01-28 DIAGNOSIS — Z6826 Body mass index (BMI) 26.0-26.9, adult: Secondary | ICD-10-CM

## 2015-01-28 LAB — HEPATIC FUNCTION PANEL
ALT: 15 U/L (ref 6–29)
AST: 15 U/L (ref 10–35)
Albumin: 3.9 g/dL (ref 3.6–5.1)
Alkaline Phosphatase: 74 U/L (ref 33–130)
Bilirubin, Direct: 0.1 mg/dL (ref <=0.2)
Indirect Bilirubin: 0.5 mg/dL (ref 0.2–1.2)
Total Bilirubin: 0.6 mg/dL (ref 0.2–1.2)
Total Protein: 5.9 g/dL — ABNORMAL LOW (ref 6.1–8.1)

## 2015-01-28 LAB — LIPID PANEL
Cholesterol: 159 mg/dL (ref 125–200)
HDL: 72 mg/dL (ref >=46)
LDL Cholesterol: 66 mg/dL (ref <130)
Total CHOL/HDL Ratio: 2.2 Ratio (ref <=5.0)
Triglycerides: 103 mg/dL (ref <150)
VLDL: 21 mg/dL (ref <30)

## 2015-01-28 LAB — BASIC METABOLIC PANEL WITH GFR
BUN: 24 mg/dL (ref 7–25)
CO2: 26 mmol/L (ref 20–31)
Calcium: 9.5 mg/dL (ref 8.6–10.4)
Chloride: 106 mmol/L (ref 98–110)
Creat: 1.05 mg/dL — ABNORMAL HIGH (ref 0.60–0.93)
GFR, Est African American: 62 mL/min (ref >=60)
GFR, Est Non African American: 54 mL/min — ABNORMAL LOW (ref >=60)
Glucose, Bld: 64 mg/dL — ABNORMAL LOW (ref 65–99)
Potassium: 4.2 mmol/L (ref 3.5–5.3)
Sodium: 140 mmol/L (ref 135–146)

## 2015-01-28 LAB — MAGNESIUM: Magnesium: 2.2 mg/dL (ref 1.5–2.5)

## 2015-01-28 LAB — IRON AND TIBC
%SAT: 37 % (ref 11–50)
Iron: 120 ug/dL (ref 45–160)
TIBC: 328 ug/dL (ref 250–450)
UIBC: 208 ug/dL (ref 125–400)

## 2015-01-28 NOTE — Progress Notes (Addendum)
Patient ID: Carlia Bomkamp Farrel Conners, female   DOB: [birth year], 69 y.o.   MRN: [id number]  Medicare Annual Wellness/Preventative  Visit And  Comprehensive Evaluation, Examination and Management     This very nice 69 y.o. MWF presents for presents for a presents for a  presents for a Medicare Annual Wellness/Preventative Visit & comprehensive evaluation and management of multiple medical co-morbidities.  Patient has been followed for HTN, Prediabetes, Hyperlipidemia, and Vitamin D Deficiency.  Patient was also recently seen by Dr Mayer Camel for a steroid shot to her Rt Knee & hip bursae. Patient also has GERD which is controlled with diet & meds.       HTN predates circa 1990's.  Patient's BP has been controlled at home and patient denies any cardiac symptoms as chest pain, palpitations, shortness of breath, dizziness or ankle swelling. Today's BP: 122/70 mmHg      Patient's hyperlipidemia is controlled with diet and medications. Patient denies myalgias or other medication SE's. Last lipids were at goal - Cholesterol 157; HDL 71; LDL 65; Triglycerides 107 on 10/28/2014.     Patient has prediabetes predating since Aug 2012 with A1c 6.0%  and patient denies reactive hypoglycemic symptoms, visual blurring, diabetic polys, or paresthesias. Last A1c was  5.9% on 10/28/2014.     Finally, patient has history of Vitamin D Deficiency of 39 in 2012 and last Vitamin D was 66 on 10/28/2014.      Medication Sig  . Ascorbic Acid (VITAMIN C) 500 MG CAPS Take 1 capsule by mouth daily.  [name] Kitchen esomeprazole (NEXIUM) 40 MG capsule take 1 capsule by mouth once daily BEFORE BREAKFAST  . furosemide (LASIX) 20 MG tablet Take 1 tablet (20 mg total) by mouth daily.  [name] Kitchen gabapentin (NEURONTIN) 600 MG tablet Take 600 mg by mouth 4 (four) times daily.   [name] Kitchen levothyroxine  50 MCG tablet Take 1 tablet every morning on an empty stomach for 30 minutes  . Magnesium 250 MG TABS Take 1 tablet by mouth daily.  [name] Kitchen OVER THE COUNTER MEDICATION Taking  Tangy Tangerine tabs 2.0 # 2 tabs daily.  . pravastatin (PRAVACHOL) 40 MG tablet take 1 tablet by mouth at bedtime FOR CHOLESTEROL.  [name] Kitchen ranitidine (ZANTAC) 300 MG tablet Take 1 tablet (300 mg total) by mouth 2 (two) times daily. For acid indigestion and reflux  . doxycycline (VIBRA-TABS) 100 MG tablet Take 1 tablet (100 mg total) by mouth 2 (two) times daily.  . predniSONE (DELTASONE) 20 MG tablet 2 tablets daily for 3 days, 1 tablet daily for 4 days.  [name] Kitchen VITAMIN D, ERGOCALCIFEROL, PO Take 5,000 Int'l Units/day by mouth daily.   No Known Allergies   Past Medical History  Diagnosis Date  . Hypertension   . Pre-diabetes   . Degenerative joint disease   . GERD (gastroesophageal reflux disease)   . Hyperlipidemia   . Thyroid disease   . Migraines     neurontin helps  . Diabetes mellitus without complication [facility])    Health Maintenance  Topic Date Due  . Hepatitis C Screening  [birth year]  . COLONOSCOPY  02/15/1994  . MAMMOGRAM  01/30/2015  . INFLUENZA VACCINE  11/16/2015  . TETANUS/TDAP  11/11/2024  . DEXA SCAN  Completed  . ZOSTAVAX  Completed  . PNA vac Low Risk Adult  Completed   Immunization History  Administered Date(s) Administered  . Influenza, High Dose Seasonal PF 12/29/2013, 01/15/2015  . Influenza-Unspecified 01/24/2011, 02/02/2012  . Pneumococcal Conjugate-13 10/28/2014  . Pneumococcal-Unspecified 11/28/2011  .  Td 11/17/2010  . Tdap 11/12/2014  . Zoster 11/17/2010   Past Surgical History  Procedure Laterality Date  . Ventral hernia repair    . Cholecystectomy  1996    open  . Pilonidal cyst excision  1972  . Tonsillectomy    . Colonoscopy    . Upper gi endoscopy     Family History  Problem Relation Age of Onset  . Ovarian cancer Mother   . Breast cancer Sister   . Thyroid cancer Sister   . Stroke Paternal Uncle    Social History  Substance Use Topics  . Smoking status: Never Smoker   . Smokeless tobacco: Never Used  . Alcohol Use: No     ROS Constitutional: Denies fever, chills, weight loss/gain, headaches, insomnia,  night sweats, and change in appetite. Does c/o fatigue. Eyes: Denies redness, blurred vision, diplopia, discharge, itchy, watery eyes.  ENT: Denies discharge, congestion, post nasal drip, epistaxis, sore throat, earache, hearing loss, dental pain, Tinnitus, Vertigo, Sinus pain, snoring.  Cardio: Denies chest pain, palpitations, irregular heartbeat, syncope, dyspnea, diaphoresis, orthopnea, PND, claudication, edema Respiratory: denies cough, dyspnea, DOE, pleurisy, hoarseness, laryngitis, wheezing.  Gastrointestinal: Denies dysphagia, heartburn, reflux, water brash, pain, cramps, nausea, vomiting, bloating, diarrhea, constipation, hematemesis, melena, hematochezia, jaundice, hemorrhoids Genitourinary: Denies dysuria, frequency, urgency, nocturia, hesitancy, discharge, hematuria, flank pain Breast: Breast lumps, nipple discharge, bleeding.  Musculoskeletal: c/o pains in right hip & knee. Denies falls. Skin: Denies puritis, rash, hives, warts, acne, eczema, changing in skin lesion Neuro: No weakness, tremor, incoordination, spasms, paresthesia, pain Psychiatric: Denies confusion, memory loss, sensory loss. Denies Depression. Endocrine: Denies change in weight, skin, hair change, nocturia, and paresthesia, diabetic polys, visual blurring, hyper / hypo glycemic episodes.  Heme/Lymph: No excessive bleeding, bruising, enlarged lymph nodes.  Physical Exam  BP 122/70 mmHg  Pulse 68  Temp(Src) 97.3 F (36.3 C)  Resp 16  Ht 5' 3.75" (1.619 m)  Wt 154 lb 6.4 oz (70.035 kg)  BMI 26.72 kg/m2  General Appearance: Well nourished and in no apparent distress. Eyes: PERRLA, EOMs, conjunctiva no swelling or erythema, normal fundi and vessels. Sinuses: No frontal/maxillary tenderness ENT/Mouth: EACs patent / TMs  nl. Nares clear without erythema, swelling, mucoid exudates. Oral hygiene is good. No erythema, swelling, or  exudate. Tongue normal, non-obstructing. Tonsils not swollen or erythematous. Hearing normal.  Neck: Supple, thyroid normal. No bruits, nodes or JVD. Respiratory: Respiratory effort normal.  BS equal and clear bilateral without rales, rhonci, wheezing or stridor. Cardio: Heart sounds are normal with regular rate and rhythm and no murmurs, rubs or gallops. Peripheral pulses are normal and equal bilaterally without edema. No aortic or femoral bruits. Chest: symmetric with normal excursions and percussion. Breasts: Deferred to GYN.  Abdomen: Flat, soft, with bowel sounds. Nontender, no guarding, rebound, hernias, masses, or organomegaly.  Lymphatics: Non tender without lymphadenopathy.  Genitourinary: Deferred to GYN. Musculoskeletal: Full ROM all peripheral extremities, joint stability, 5/5 strength, and normal gait. Skin: Warm and dry without rashes, lesions, cyanosis, clubbing or  ecchymosis.  Neuro: Cranial nerves intact, reflexes equal bilaterally. Normal muscle tone, no cerebellar symptoms. Sensation intact.  Pysch: Alert and oriented X 3, normal affect, Insight and Judgment appropriate.   Assessment and Plan  1. Essential hypertension  - Microalbumin / creatinine urine ratio - EKG 12-Lead - [place], RETROPERITNL ABD,  LTD - TSH  2. Hyperlipidemia  - Lipid panel  3. Prediabetes  - Hemoglobin A1c - Insulin, random  4. Vitamin D deficiency  - Vit D  25 hydroxy   5. Hypothyroidism   6. Gastroesophageal reflux disease  -Discussed tapering Nexium to qod x 1 mo then q3d if tolerated and then ultimately stop with usage of Ranitidine on the days off of Nexium.   7. CKD Stage 3 (GFR 36 ml/min)   8. Screening for rectal cancer  - POC Hemoccult Bld/Stl   9. Depression screen   10. BMI 26.71,  adult   11. Fe deficiency anemia  - Iron and TIBC  12. Medication management  - Urinalysis, Routine w reflex microscopic  - CBC with Differential/Platelet - BASIC METABOLIC  PANEL WITH GFR - Magnesium - Hepatic function panel  13. B12 deficiency anemia  - Vitamin B12  14. At low risk for fall   Continue prudent diet as discussed, weight control, BP monitoring, regular exercise, and medications. Discussed med's effects and SE's. Screening labs and tests as requested with regular follow-up as recommended.  Over 40 minutes of exam, counseling, chart review was performed.

## 2015-01-28 NOTE — Patient Instructions (Signed)
Recommend Adult Low Dose Aspirin or   coated  Aspirin 81 mg daily   To reduce risk of Colon Cancer 20 %,   Skin Cancer 26 % ,   Melanoma 46%   and   Pancreatic cancer 60%   ++++++++++++++++++++++++++++++++++++++++++++++++++++++  Vitamin D goal   is between 70-100.   Please make sure that you are taking your Vitamin D as directed.   It is very important as a natural anti-inflammatory   helping hair, skin, and nails, as well as reducing stroke and heart attack risk.   It helps your bones and helps with mood.  It also decreases numerous cancer risks so please take it as directed.   Low Vit D is associated with a 200-300% higher risk for CANCER   and 200-300% higher risk for HEART   ATTACK  &  STROKE.   ......................................  It is also associated with higher death rate at younger ages,   autoimmune diseases like Rheumatoid arthritis, Lupus, Multiple Sclerosis.     Also many other serious conditions, like depression, Alzheimer's  Dementia, infertility, muscle aches, fatigue, fibromyalgia - just to name a few.  ++++++++++++++++++++++++++++++++++++++++++++++++  Recommend the book "The END of DIETING" by Dr Joel Fuhrman   & the book "The END of DIABETES " by Dr Joel Fuhrman  At [employer] - get book & Audio CD's     Being diabetic has a  300% increased risk for heart attack, stroke, cancer, and alzheimer- type vascular dementia. It is very important that you work harder with diet by avoiding all foods that are white. Avoid white rice (brown & wild rice is OK), white potatoes (sweetpotatoes in moderation is OK), White bread or wheat bread or anything made out of white flour like bagels, donuts, rolls, buns, biscuits, cakes, pastries, cookies, pizza crust, and pasta (made from white flour & egg whites) - vegetarian pasta or spinach or wheat pasta is OK. Multigrain breads like Arnold's or [employer], or multigrain sandwich thins or flatbreads.  Diet,  exercise and weight loss can reverse and cure diabetes in the early stages.  Diet, exercise and weight loss is very important in the control and prevention of complications of diabetes which affects every system in your body, ie. Brain - dementia/stroke, eyes - glaucoma/blindness, heart - heart attack/heart failure, kidneys - dialysis, stomach - gastric paralysis, intestines - malabsorption, nerves - severe painful neuritis, circulation - gangrene & loss of a leg(s), and finally cancer and Alzheimers.    I recommend avoid fried & greasy foods,  sweets/candy, white rice (brown or wild rice or Quinoa is OK), white potatoes (sweet potatoes are OK) - anything made from white flour - bagels, doughnuts, rolls, buns, biscuits,white and wheat breads, pizza crust and traditional pasta made of white flour & egg white(vegetarian pasta or spinach or wheat pasta is OK).  Multi-grain bread is OK - like multi-grain flat bread or sandwich thins. Avoid alcohol in excess. Exercise is also important.    Eat all the vegetables you want - avoid meat, especially red meat and dairy - especially cheese.  Cheese is the most concentrated form of trans-fats which is the worst thing to clog up our arteries. Veggie cheese is OK which can be found in the fresh produce section at [employer] or [employer] or [employer]  ++++++++++++++++++++++++++++++++++++++++++++++++++ DASH Eating Plan  DASH stands for "Dietary Approaches to Stop Hypertension."   The DASH eating plan is a healthy eating plan that has been shown to reduce high   blood pressure (hypertension). Additional health benefits may include reducing the risk of type 2 diabetes mellitus, heart disease, and stroke. The DASH eating plan may also help with weight loss.  WHAT DO I NEED TO KNOW ABOUT THE DASH EATING PLAN? For the DASH eating plan, you will follow these general guidelines:  Choose foods with a percent daily value for sodium of less than 5% (as listed on the food  label).  Use salt-free seasonings or herbs instead of table salt or sea salt.  Check with your health care provider or pharmacist before using salt substitutes.  Eat lower-sodium products, often labeled as "lower sodium" or "no salt added."  Eat fresh foods.  Eat more vegetables, fruits, and low-fat dairy products.    Choose whole grains. Look for the word "whole" as the first word in the ingredient list.  Choose fish   Limit sweets, desserts, sugars, and sugary drinks.  Choose heart-healthy fats.  Eat veggie cheese   Eat more home-cooked food and less restaurant, buffet, and fast food.  Limit fried foods.  Cook foods using methods other than frying.  Limit canned vegetables. If you do use them, rinse them well to decrease the sodium.  When eating at a restaurant, ask that your food be prepared with less salt, or no salt if possible.                      WHAT FOODS CAN I EAT?  Seek help from a dietitian for individual calorie needs. Grains Whole grain or whole wheat bread. Brown rice. Whole grain or whole wheat pasta. Quinoa, bulgur, and whole grain cereals. Low-sodium cereals. Corn or whole wheat flour tortillas. Whole grain cornbread. Whole grain crackers. Low-sodium crackers.  Vegetables Fresh or frozen vegetables (raw, steamed, roasted, or grilled). Low-sodium or reduced-sodium tomato and vegetable juices. Low-sodium or reduced-sodium tomato sauce and paste. Low-sodium or reduced-sodium canned vegetables.   Fruits All fresh, canned (in natural juice), or frozen fruits.  Meat and Other Protein Products  All fish and seafood.  Dried beans, peas, or lentils. Unsalted nuts and seeds. Unsalted canned beans. Dairy Low-fat dairy products, such as skim or 1% milk, 2% or reduced-fat cheeses, low-fat ricotta or cottage cheese, or plain low-fat yogurt. Low-sodium or reduced-sodium cheeses.  Fats and Oils Tub margarines without trans fats. Light or reduced-fat mayonnaise  and salad dressings (reduced sodium). Avocado. Safflower, olive, or canola oils. Natural peanut or almond butter.  Other Unsalted popcorn and pretzels. The items listed above may not be a complete list of recommended foods or beverages. Contact your dietitian for more options.  +++++++++++++++++++++++++++++++++++++++++++  WHAT FOODS ARE NOT RECOMMENDED?  Grains/ White flour or wheat flour  White bread. White pasta. White rice. Refined cornbread. Bagels and croissants. Crackers that contain trans fat.  Vegetables  Creamed or fried vegetables. Vegetables in a . Regular canned vegetables. Regular canned tomato sauce and paste. Regular tomato and vegetable juices.  Fruits Dried fruits. Canned fruit in light or heavy syrup. Fruit juice.  Meat and Other Protein Products Meat in general. Fatty cuts of meat. Ribs, chicken wings, bacon, sausage, bologna, salami, chitterlings, fatback, hot dogs, bratwurst, and packaged luncheon meats. Salted nuts and seeds. Canned beans with salt.  Dairy Whole or 2% milk, cream, half-and-half, and cream cheese. Whole-fat or sweetened yogurt. Full-fat cheeses or blue cheese. Nondairy creamers and whipped toppings. Processed cheese, cheese spreads, or cheese curds.  Condiments Onion and garlic salt, seasoned salt, table salt, and sea  salt. Canned and packaged gravies. Worcestershire sauce. Tartar sauce. Barbecue sauce. Teriyaki sauce. Soy sauce, including reduced sodium. Steak sauce. Fish sauce. Oyster sauce. Cocktail sauce. Horseradish. Ketchup and mustard. Meat flavorings and tenderizers. Bouillon cubes. Hot sauce. Tabasco sauce. Marinades. Taco seasonings. Relishes.  Fats and Oils Butter, stick margarine, lard, shortening, ghee, and bacon fat. Coconut, palm kernel, or palm oils. Regular salad dressings.  Pickles and olives. Salted popcorn and pretzels. The items listed above may not be a complete list of foods and beverages to avoid.   Preventive Care for  Adults  A healthy lifestyle and preventive care can promote health and wellness. Preventive health guidelines for women include the following key practices.  A routine yearly physical is a good way to check with your health care provider about your health and preventive screening. It is a chance to share any concerns and updates on your health and to receive a thorough exam.  Visit your dentist for a routine exam and preventive care every 6 months. Brush your teeth twice a day and floss once a day. Good oral hygiene prevents tooth decay and gum disease.  The frequency of eye exams is based on your age, health, family medical history, use of contact lenses, and other factors. Follow your health care provider's recommendations for frequency of eye exams.  Eat a healthy diet. Foods like vegetables, fruits, whole grains, low-fat dairy products, and lean protein foods contain the nutrients you need without too many calories. Decrease your intake of foods high in solid fats, added sugars, and salt. Eat the right amount of calories for you.Get information about a proper diet from your health care provider, if necessary.  Regular physical exercise is one of the most important things you can do for your health. Most adults should get at least 150 minutes of moderate-intensity exercise (any activity that increases your heart rate and causes you to sweat) each week. In addition, most adults need muscle-strengthening exercises on 2 or more days a week.  Maintain a healthy weight. The body mass index (BMI) is a screening tool to identify possible weight problems. It provides an estimate of body fat based on height and weight. Your health care provider can find your BMI and can help you achieve or maintain a healthy weight.For adults 20 years and older:  A BMI below 18.5 is considered underweight.  A BMI of 18.5 to 24.9 is normal.  A BMI of 25 to 29.9 is considered overweight.  A BMI of 30 and above is  considered obese.  Maintain normal blood lipids and cholesterol levels by exercising and minimizing your intake of saturated fat. Eat a balanced diet with plenty of fruit and vegetables. If your lipid or cholesterol levels are high, you are over 50, or you are at high risk for heart disease, you may need your cholesterol levels checked more frequently.Ongoing high lipid and cholesterol levels should be treated with medicines if diet and exercise are not working.  If you smoke, find out from your health care provider how to quit. If you do not use tobacco, do not start.  Lung cancer screening is recommended for adults aged 55-80 years who are at high risk for developing lung cancer because of a history of smoking. A yearly low-dose CT scan of the lungs is recommended for people who have at least a 30-pack-year history of smoking and are a current smoker or have quit within the past 15 years. A pack year of smoking is smoking an   average of 1 pack of cigarettes a day for 1 year (for example: 1 pack a day for 30 years or 2 packs a day for 15 years). Yearly screening should continue until the smoker has stopped smoking for at least 15 years. Yearly screening should be stopped for people who develop a health problem that would prevent them from having lung cancer treatment.  Avoid use of street drugs. Do not share needles with anyone. Ask for help if you need support or instructions about stopping the use of drugs.  High blood pressure causes heart disease and increases the risk of stroke.  Ongoing high blood pressure should be treated with medicines if weight loss and exercise do not work.  If you are 62-34 years old, ask your health care provider if you should take aspirin to prevent strokes.  Diabetes screening involves taking a blood sample to check your fasting blood sugar level. This should be done once every 3 years, after age 63, if you are within normal weight and without risk factors for diabetes.  Testing should be considered at a younger age or be carried out more frequently if you are overweight and have at least 1 risk factor for diabetes.  Breast cancer screening is essential preventive care for women. You should practice "breast self-awareness." This means understanding the normal appearance and feel of your breasts and may include breast self-examination. Any changes detected, no matter how small, should be reported to a health care provider. Women in their 72s and 30s should have a clinical breast exam (CBE) by a health care provider as part of a regular health exam every 1 to 3 years. After age 58, women should have a CBE every year. Starting at age 51, women should consider having a mammogram (breast X-ray test) every year. Women who have a family history of breast cancer should talk to their health care provider about genetic screening. Women at a high risk of breast cancer should talk to their health care providers about having an MRI and a mammogram every year.  Breast cancer gene (BRCA)-related cancer risk assessment is recommended for women who have family members with BRCA-related cancers. BRCA-related cancers include breast, ovarian, tubal, and peritoneal cancers. Having family members with these cancers may be associated with an increased risk for harmful changes (mutations) in the breast cancer genes BRCA1 and BRCA2. Results of the assessment will determine the need for genetic counseling and BRCA1 and BRCA2 testing.  Routine pelvic exams to screen for cancer are no longer recommended for nonpregnant women who are considered low risk for cancer of the pelvic organs (ovaries, uterus, and vagina) and who do not have symptoms. Ask your health care provider if a screening pelvic exam is right for you.  If you have had past treatment for cervical cancer or a condition that could lead to cancer, you need Pap tests and screening for cancer for at least 20 years after your treatment. If Pap  tests have been discontinued, your risk factors (such as having a new sexual partner) need to be reassessed to determine if screening should be resumed. Some women have medical problems that increase the chance of getting cervical cancer. In these cases, your health care provider may recommend more frequent screening and Pap tests.    Colorectal cancer can be detected and often prevented. Most routine colorectal cancer screening begins at the age of 8 years and continues through age 45 years. However, your health care provider may recommend screening at an  earlier age if you have risk factors for colon cancer. On a yearly basis, your health care provider may provide home test kits to check for hidden blood in the stool. Use of a small camera at the end of a tube, to directly examine the colon (sigmoidoscopy or colonoscopy), can detect the earliest forms of colorectal cancer. Talk to your health care provider about this at age 35, when routine screening begins. Direct exam of the colon should be repeated every 5-10 years through age 58 years, unless early forms of pre-cancerous polyps or small growths are found.  Osteoporosis is a disease in which the bones lose minerals and strength with aging. This can result in serious bone fractures or breaks. The risk of osteoporosis can be identified using a bone density scan. Women ages 75 years and over and women at risk for fractures or osteoporosis should discuss screening with their health care providers. Ask your health care provider whether you should take a calcium supplement or vitamin D to reduce the rate of osteoporosis.  Menopause can be associated with physical symptoms and risks. Hormone replacement therapy is available to decrease symptoms and risks. You should talk to your health care provider about whether hormone replacement therapy is right for you.  Use sunscreen. Apply sunscreen liberally and repeatedly throughout the day. You should seek shade  when your shadow is shorter than you. Protect yourself by wearing long sleeves, pants, a wide-brimmed hat, and sunglasses year round, whenever you are outdoors.  Once a month, do a whole body skin exam, using a mirror to look at the skin on your back. Tell your health care provider of new moles, moles that have irregular borders, moles that are larger than a pencil eraser, or moles that have changed in shape or color.  Stay current with required vaccines (immunizations).  Influenza vaccine. All adults should be immunized every year.  Tetanus, diphtheria, and acellular pertussis (Td, Tdap) vaccine. Pregnant women should receive 1 dose of Tdap vaccine during each pregnancy. The dose should be obtained regardless of the length of time since the last dose. Immunization is preferred during the 27th-36th week of gestation. An adult who has not previously received Tdap or who does not know her vaccine status should receive 1 dose of Tdap. This initial dose should be followed by tetanus and diphtheria toxoids (Td) booster doses every 10 years. Adults with an unknown or incomplete history of completing a 3-dose immunization series with Td-containing vaccines should begin or complete a primary immunization series including a Tdap dose. Adults should receive a Td booster every 10 years.    Zoster vaccine. One dose is recommended for adults aged 79 years or older unless certain conditions are present.    Pneumococcal 13-valent conjugate (PCV13) vaccine. When indicated, a person who is uncertain of her immunization history and has no record of immunization should receive the PCV13 vaccine. An adult aged 90 years or older who has certain medical conditions and has not been previously immunized should receive 1 dose of PCV13 vaccine. This PCV13 should be followed with a dose of pneumococcal polysaccharide (PPSV23) vaccine. The PPSV23 vaccine dose should be obtained at least 8 weeks after the dose of PCV13 vaccine.  An adult aged 89 years or older who has certain medical conditions and previously received 1 or more doses of PPSV23 vaccine should receive 1 dose of PCV13. The PCV13 vaccine dose should be obtained 1 or more years after the last PPSV23 vaccine dose.  Pneumococcal polysaccharide (PPSV23) vaccine. When PCV13 is also indicated, PCV13 should be obtained first. All adults aged 46 years and older should be immunized. An adult younger than age 80 years who has certain medical conditions should be immunized. Any person who resides in a nursing home or long-term care facility should be immunized. An adult smoker should be immunized. People with an immunocompromised condition and certain other conditions should receive both PCV13 and PPSV23 vaccines. People with human immunodeficiency virus (HIV) infection should be immunized as soon as possible after diagnosis. Immunization during chemotherapy or radiation therapy should be avoided. Routine use of PPSV23 vaccine is not recommended for American Indians, [place] Natives, or people younger than 65 years unless there are medical conditions that require PPSV23 vaccine. When indicated, people who have unknown immunization and have no record of immunization should receive PPSV23 vaccine. One-time revaccination 5 years after the first dose of PPSV23 is recommended for people aged 19-64 years who have chronic kidney failure, nephrotic syndrome, asplenia, or immunocompromised conditions. People who received 1-2 doses of PPSV23 before age 60 years should receive another dose of PPSV23 vaccine at age 49 years or later if at least 5 years have passed since the previous dose. Doses of PPSV23 are not needed for people immunized with PPSV23 at or after age 14 years.   Preventive Services / Frequency  Ages 48 years and over  Blood pressure check.  Lipid and cholesterol check.  Lung cancer screening. / Every year if you are aged 75-80 years and have a 30-pack-year history of  smoking and currently smoke or have quit within the past 15 years. Yearly screening is stopped once you have quit smoking for at least 15 years or develop a health problem that would prevent you from having lung cancer treatment.  Clinical breast exam.** / Every year after age 81 years.  BRCA-related cancer risk assessment.** / For women who have family members with a BRCA-related cancer (breast, ovarian, tubal, or peritoneal cancers).  Mammogram.** / Every year beginning at age 43 years and continuing for as long as you are in good health. Consult with your health care provider.  Pap test.** / Every 3 years starting at age 14 years through age 4 or 59 years with 3 consecutive normal Pap tests. Testing can be stopped between 65 and 70 years with 3 consecutive normal Pap tests and no abnormal Pap or HPV tests in the past 10 years.  Fecal occult blood test (FOBT) of stool. / Every year beginning at age 5 years and continuing until age 19 years. You may not need to do this test if you get a colonoscopy every 10 years.  Flexible sigmoidoscopy or colonoscopy.** / Every 5 years for a flexible sigmoidoscopy or every 10 years for a colonoscopy beginning at age 72 years and continuing until age 21 years.  Hepatitis C blood test.** / For all people born from [birth year] through 1967 and any individual with known risks for hepatitis C.  Osteoporosis screening.** / A one-time screening for women ages 55 years and over and women at risk for fractures or osteoporosis.  Skin self-exam. / Monthly.  Influenza vaccine. / Every year.  Tetanus, diphtheria, and acellular pertussis (Tdap/Td) vaccine.** / 1 dose of Td every 10 years.  Zoster vaccine.** / 1 dose for adults aged 65 years or older.  Pneumococcal 13-valent conjugate (PCV13) vaccine.** / Consult your health care provider.  Pneumococcal polysaccharide (PPSV23) vaccine.** / 1 dose for all adults aged 26 years and older.  Screening for abdominal aortic  aneurysm (AAA)  by ultrasound is recommended for people who have history of high blood pressure or who are current or former smokers.

## 2015-01-29 LAB — CBC WITH DIFFERENTIAL/PLATELET
Basophils Absolute: 0 10*3/uL (ref 0.0–0.1)
Basophils Relative: 0 % (ref 0–1)
Eosinophils Absolute: 0.3 10*3/uL (ref 0.0–0.7)
Eosinophils Relative: 3 % (ref 0–5)
HCT: 37.7 % (ref 36.0–46.0)
Hemoglobin: 12.5 g/dL (ref 12.0–15.0)
Lymphocytes Relative: 24 % (ref 12–46)
Lymphs Abs: 2.3 10*3/uL (ref 0.7–4.0)
MCH: 29.3 pg (ref 26.0–34.0)
MCHC: 33.2 g/dL (ref 30.0–36.0)
MCV: 88.5 fL (ref 78.0–100.0)
MPV: 10.6 fL (ref 8.6–12.4)
Monocytes Absolute: 0.9 10*3/uL (ref 0.1–1.0)
Monocytes Relative: 9 % (ref 3–12)
Neutro Abs: 6.1 10*3/uL (ref 1.7–7.7)
Neutrophils Relative %: 64 % (ref 43–77)
Platelets: 156 10*3/uL (ref 150–400)
RBC: 4.26 MIL/uL (ref 3.87–5.11)
RDW: 13.6 % (ref 11.5–15.5)
WBC: 9.6 10*3/uL (ref 4.0–10.5)

## 2015-01-29 LAB — URINALYSIS, ROUTINE W REFLEX MICROSCOPIC
Bilirubin Urine: NEGATIVE
Glucose, UA: NEGATIVE
Hgb urine dipstick: NEGATIVE
Ketones, ur: NEGATIVE
Leukocytes, UA: NEGATIVE
Nitrite: NEGATIVE
Protein, ur: NEGATIVE
Specific Gravity, Urine: 1.011 (ref 1.001–1.035)
pH: 5.5 (ref 5.0–8.0)

## 2015-01-29 LAB — VITAMIN D 25 HYDROXY (VIT D DEFICIENCY, FRACTURES): Vit D, 25-Hydroxy: 78 ng/mL (ref 30–100)

## 2015-01-29 LAB — MICROALBUMIN / CREATININE URINE RATIO
Creatinine, Urine: 55.6 mg/dL
Microalb Creat Ratio: 9 mg/g (ref 0.0–30.0)
Microalb, Ur: 0.5 mg/dL (ref <2.0)

## 2015-01-29 LAB — VITAMIN B12: Vitamin B-12: 1344 pg/mL — ABNORMAL HIGH (ref 211–911)

## 2015-01-29 LAB — HEMOGLOBIN A1C
Hgb A1c MFr Bld: 5.8 % — ABNORMAL HIGH (ref <5.7)
Mean Plasma Glucose: 120 mg/dL — ABNORMAL HIGH (ref <117)

## 2015-01-29 LAB — INSULIN, RANDOM: Insulin: 9.2 u[IU]/mL (ref 2.0–19.6)

## 2015-01-29 LAB — TSH: TSH: 1.709 u[IU]/mL (ref 0.350–4.500)

## 2015-02-16 ENCOUNTER — Encounter: Payer: Self-pay | Admitting: Internal Medicine

## 2015-02-16 ENCOUNTER — Ambulatory Visit (INDEPENDENT_AMBULATORY_CARE_PROVIDER_SITE_OTHER): Payer: Medicare Other | Admitting: Internal Medicine

## 2015-02-16 VITALS — BP 162/100 | HR 70 | Temp 98.2°F | Resp 18 | Ht 63.75 in | Wt 154.0 lb

## 2015-02-16 DIAGNOSIS — J069 Acute upper respiratory infection, unspecified: Secondary | ICD-10-CM | POA: Diagnosis not present

## 2015-02-16 MED ORDER — AZITHROMYCIN 250 MG PO TABS: ORAL_TABLET | ORAL | Status: DC

## 2015-02-16 MED ORDER — PROMETHAZINE-DM 6.25-15 MG/5ML PO SYRP: ORAL_SOLUTION | ORAL | Status: DC

## 2015-02-16 MED ORDER — PREDNISONE 20 MG PO TABS: ORAL_TABLET | ORAL | Status: DC

## 2015-02-16 NOTE — Progress Notes (Signed)
Patient ID: Jillian Rodgers Farrel Conners, female   DOB: January 19, 1946, 69 y.o.   MRN: [id number]  HPI  Patient presents to the office for evaluation of cough.  It has been going on for 2 weeks.  Patient reports dry, cough without sputum production.  She does reports that coughing makes her chest hurt worse.  They also endorse chills, postnasal drip, shortness of breath and nasal congestion, rhinorrhea, clear sputum production, right ear pain.  .  They have tried ibuprofen.  They report that nothing has worked.  They admits to other sick contacts.  She does tend to have bad allergies.    Review of Systems  Constitutional: Positive for chills and malaise/fatigue. Negative for fever.  HENT: Positive for congestion and ear pain. Negative for sore throat.   Respiratory: Positive for cough and shortness of breath. Negative for sputum production and wheezing.   Neurological: Positive for headaches.    PE:  Filed Vitals:   02/16/15 1115  BP: 162/100  Pulse: 70  Temp: 98.2 F (36.8 C)  Resp: 18     General:  Alert and non-toxic, WDWN, NAD HEENT: NCAT, PERLA, EOM normal, no occular discharge or erythema.  Nasal mucosal edema with sinus tenderness to palpation.  Oropharynx clear with minimal oropharyngeal edema and erythema.  Mucous membranes moist and pink. Neck:  Cervical adenopathy Chest:  RRR no MRGs.  Lungs clear to auscultation A&P with no wheezes rhonchi or rales.   Abdomen: +BS x 4 quadrants, soft, non-tender, no guarding, rigidity, or rebound. Skin: warm and dry no rash Neuro: A&Ox4, CN II-XII grossly intact  Assessment and Plan:   1. Acute URI -zpak -prednisone -antihistamine -nasal saline

## 2015-02-25 ENCOUNTER — Other Ambulatory Visit: Payer: Self-pay | Admitting: Physician Assistant

## 2015-02-25 MED ORDER — LEVOFLOXACIN 500 MG PO TABS: 500.0000 mg | ORAL_TABLET | Freq: Every day | ORAL | Status: DC

## 2015-03-09 ENCOUNTER — Other Ambulatory Visit: Payer: Self-pay | Admitting: *Deleted

## 2015-03-09 ENCOUNTER — Ambulatory Visit (INDEPENDENT_AMBULATORY_CARE_PROVIDER_SITE_OTHER): Payer: Medicare Other | Admitting: Physician Assistant

## 2015-03-09 ENCOUNTER — Encounter: Payer: Self-pay | Admitting: Physician Assistant

## 2015-03-09 VITALS — BP 108/70 | HR 80 | Temp 97.7°F | Resp 16 | Ht 63.75 in | Wt 159.0 lb

## 2015-03-09 DIAGNOSIS — R05 Cough: Secondary | ICD-10-CM | POA: Diagnosis not present

## 2015-03-09 DIAGNOSIS — R059 Cough, unspecified: Secondary | ICD-10-CM

## 2015-03-09 MED ORDER — PRAVASTATIN SODIUM 40 MG PO TABS: ORAL_TABLET | ORAL | Status: DC

## 2015-03-09 MED ORDER — TRAMADOL HCL 50 MG PO TABS: ORAL_TABLET | ORAL | Status: DC

## 2015-03-09 NOTE — Progress Notes (Signed)
Subjective:    Patient ID: Jillian Rodgers, female    DOB: May 24, 1945, 69 y.o.   MRN: [id number]  HPI 69 y.o. WF presents with cough x 3 weeks. She had a cough in July as well, treated with doxy and prednisone and felt better. Then it came back, was seen 02/16/2015, has then had 1 prednisone and a zpak and a levaquin without relief. She is coughing, occ productive yellow mucus, feels achy all over, sinus congestion, denies fever, chills.   Had CXR 01/13/2014 with Dr. Melvyn Novas.   Blood pressure 108/70, pulse 80, temperature 97.7 F (36.5 C), temperature source Temporal, resp. rate 16, height 5' 3.75" (1.619 m), weight 159 lb (72.122 kg).  Current Outpatient Prescriptions on File Prior to Visit  Medication Sig Dispense Refill  . Ascorbic Acid (VITAMIN C) 500 MG CAPS Take 1 capsule by mouth daily.    [name] Kitchen aspirin 81 MG tablet Take 81 mg by mouth daily.    . Cholecalciferol (VITAMIN D PO) Take 5,000 Units by mouth daily.    [name] Kitchen esomeprazole (NEXIUM) 40 MG capsule take 1 capsule by mouth once daily BEFORE BREAKFAST 30 capsule 5  . furosemide (LASIX) 20 MG tablet Take 1 tablet (20 mg total) by mouth daily. 30 tablet 2  . gabapentin (NEURONTIN) 600 MG tablet Take 600 mg by mouth 4 (four) times daily.     [name] Kitchen levofloxacin (LEVAQUIN) 500 MG tablet Take 1 tablet (500 mg total) by mouth daily. 10 tablet 0  . levothyroxine (SYNTHROID) 50 MCG tablet Take 1 tablet every morning on an empty stomach for 30 minutes 90 tablet 1  . Magnesium 250 MG TABS Take 1 tablet by mouth daily.    [name] Kitchen OVER THE COUNTER MEDICATION Taking Tangy Tangerine tabs 2.0 # 2 tabs daily.    . pravastatin (PRAVACHOL) 40 MG tablet take 1 tablet by mouth at bedtime FOR CHOLESTEROL. 90 tablet 99  . predniSONE (DELTASONE) 20 MG tablet 3 tabs po day one, then 2 tabs daily x 4 days 11 tablet 0  . promethazine-dextromethorphan (PROMETHAZINE-DM) 6.25-15 MG/5ML syrup Take 5 mL q6-8 hours as needed for severe cough 360 mL 1  . ranitidine  (ZANTAC) 300 MG tablet Take 1 tablet (300 mg total) by mouth 2 (two) times daily. For acid indigestion and reflux 180 tablet 99   No current facility-administered medications on file prior to visit.    Past Medical History  Diagnosis Date  . Hypertension   . Pre-diabetes   . Degenerative joint disease   . GERD (gastroesophageal reflux disease)   . Hyperlipidemia   . Thyroid disease   . Migraines     neurontin helps  . Diabetes mellitus without complication ([place])     Review of Systems  Constitutional: Negative.   HENT: Negative.   Respiratory: Positive for cough. Negative for apnea, choking, chest tightness, shortness of breath, wheezing and stridor.   Cardiovascular: Negative.   Gastrointestinal: Negative.        + GERD  Genitourinary: Negative.   Musculoskeletal: Positive for myalgias and arthralgias. Negative for back pain, joint swelling, gait problem, neck pain and neck stiffness.       Objective:   Physical Exam  Constitutional: She appears well-developed and well-nourished.  HENT:  Head: Normocephalic and atraumatic.  Right Ear: External ear normal.  Nose: Right sinus exhibits maxillary sinus tenderness. Right sinus exhibits no frontal sinus tenderness. Left sinus exhibits maxillary sinus tenderness. Left sinus exhibits no frontal sinus tenderness.  Eyes: Conjunctivae  and EOM are normal.  Neck: Normal range of motion. Neck supple.  Cardiovascular: Normal rate, regular rhythm, normal heart sounds and intact distal pulses.   Pulmonary/Chest: Effort normal and breath sounds normal. No respiratory distress. She has no wheezes.  Abdominal: Soft. Bowel sounds are normal. She exhibits no distension and no mass. There is tenderness (epigastric). There is no rebound and no guarding.  Lymphadenopathy:    She has no cervical adenopathy.  Skin: Skin is warm and dry.       Assessment & Plan:  Cough-  Get CXR Stay on zyrtec, take tramdol for cough, get back on nexium every  other day

## 2015-03-09 NOTE — Patient Instructions (Addendum)
Take the tramadol 3 times a day Get back on the nexium every other day for now Get chest xray  For drainage stop zyrtec take chlortrimeton (chlorpheniramine) 4 mg every 4 hours available over the counter (may cause drowsiness)   Cough, Adult Coughing is a reflex that clears your throat and your airways. Coughing helps to heal and protect your lungs. It is normal to cough occasionally, but a cough that happens with other symptoms or lasts a long time may be a sign of a condition that needs treatment. A cough may last only 2-3 weeks (acute), or it may last longer than 8 weeks (chronic). CAUSES Coughing is commonly caused by:  Breathing in substances that irritate your lungs.  A viral or bacterial respiratory infection.  Allergies.  Asthma.  Postnasal drip.  Smoking.  Acid backing up from the stomach into the esophagus (gastroesophageal reflux).  Certain medicines.  Chronic lung problems, including COPD (or rarely, lung cancer).  Other medical conditions such as heart failure. HOME CARE INSTRUCTIONS  Pay attention to any changes in your symptoms. Take these actions to help with your discomfort:  Take medicines only as told by your health care provider.  If you were prescribed an antibiotic medicine, take it as told by your health care provider. Do not stop taking the antibiotic even if you start to feel better.  Talk with your health care provider before you take a cough suppressant medicine.  Drink enough fluid to keep your urine clear or pale yellow.  If the air is dry, use a cold steam vaporizer or humidifier in your bedroom or your home to help loosen secretions.  Avoid anything that causes you to cough at work or at home.  If your cough is worse at night, try sleeping in a semi-upright position.  Avoid cigarette smoke. If you smoke, quit smoking. If you need help quitting, ask your health care provider.  Avoid caffeine.  Avoid alcohol.  Rest as needed. SEEK  MEDICAL CARE IF:   You have new symptoms.  You cough up pus.  Your cough does not get better after 2-3 weeks, or your cough gets worse.  You cannot control your cough with suppressant medicines and you are losing sleep.  You develop pain that is getting worse or pain that is not controlled with pain medicines.  You have a fever.  You have unexplained weight loss.  You have night sweats. SEEK IMMEDIATE MEDICAL CARE IF:  You cough up blood.  You have difficulty breathing.  Your heartbeat is very fast.   This information is not intended to replace advice given to you by your health care provider. Make sure you discuss any questions you have with your health care provider.   Document Released: 09/30/2010 Document Revised: 12/23/2014 Document Reviewed: 06/10/2014 Elsevier Interactive Patient Education [employer].

## 2015-03-10 ENCOUNTER — Other Ambulatory Visit: Payer: Self-pay

## 2015-03-10 ENCOUNTER — Ambulatory Visit (HOSPITAL_COMMUNITY)
Admission: RE | Admit: 2015-03-10 | Discharge: 2015-03-10 | Disposition: A | Discharge status: Home / Self Care | Payer: Medicare Other | Source: Ambulatory Visit | Attending: Physician Assistant | Admitting: Physician Assistant

## 2015-03-10 DIAGNOSIS — R0989 Other specified symptoms and signs involving the circulatory and respiratory systems: Secondary | ICD-10-CM | POA: Diagnosis not present

## 2015-03-10 DIAGNOSIS — R059 Cough, unspecified: Secondary | ICD-10-CM

## 2015-03-10 DIAGNOSIS — R05 Cough: Secondary | ICD-10-CM | POA: Insufficient documentation

## 2015-03-10 MED ORDER — FUROSEMIDE 20 MG PO TABS: 20.0000 mg | ORAL_TABLET | Freq: Every day | ORAL | Status: DC

## 2015-04-04 ENCOUNTER — Other Ambulatory Visit: Payer: Self-pay | Admitting: Physician Assistant

## 2015-05-05 ENCOUNTER — Ambulatory Visit (INDEPENDENT_AMBULATORY_CARE_PROVIDER_SITE_OTHER): Payer: Medicare Other | Admitting: Internal Medicine

## 2015-05-05 ENCOUNTER — Encounter: Payer: Self-pay | Admitting: Internal Medicine

## 2015-05-05 VITALS — BP 120/72 | HR 78 | Temp 98.0°F | Resp 16 | Ht 63.75 in | Wt 156.0 lb

## 2015-05-05 DIAGNOSIS — Z0001 Encounter for general adult medical examination with abnormal findings: Secondary | ICD-10-CM | POA: Diagnosis not present

## 2015-05-05 DIAGNOSIS — E785 Hyperlipidemia, unspecified: Secondary | ICD-10-CM | POA: Diagnosis not present

## 2015-05-05 DIAGNOSIS — I10 Essential (primary) hypertension: Secondary | ICD-10-CM

## 2015-05-05 DIAGNOSIS — R6889 Other general symptoms and signs: Secondary | ICD-10-CM | POA: Diagnosis not present

## 2015-05-05 DIAGNOSIS — N183 Chronic kidney disease, stage 3 unspecified: Secondary | ICD-10-CM

## 2015-05-05 DIAGNOSIS — E039 Hypothyroidism, unspecified: Secondary | ICD-10-CM

## 2015-05-05 DIAGNOSIS — Z6826 Body mass index (BMI) 26.0-26.9, adult: Secondary | ICD-10-CM

## 2015-05-05 DIAGNOSIS — M1990 Unspecified osteoarthritis, unspecified site: Secondary | ICD-10-CM

## 2015-05-05 DIAGNOSIS — R7303 Prediabetes: Secondary | ICD-10-CM

## 2015-05-05 DIAGNOSIS — Z79899 Other long term (current) drug therapy: Secondary | ICD-10-CM

## 2015-05-05 DIAGNOSIS — R058 Other specified cough: Secondary | ICD-10-CM

## 2015-05-05 DIAGNOSIS — R05 Cough: Secondary | ICD-10-CM

## 2015-05-05 DIAGNOSIS — R1031 Right lower quadrant pain: Secondary | ICD-10-CM

## 2015-05-05 DIAGNOSIS — E559 Vitamin D deficiency, unspecified: Secondary | ICD-10-CM

## 2015-05-05 DIAGNOSIS — K219 Gastro-esophageal reflux disease without esophagitis: Secondary | ICD-10-CM

## 2015-05-05 LAB — CBC WITH DIFFERENTIAL/PLATELET
Basophils Absolute: 0 10*3/uL (ref 0.0–0.1)
Basophils Relative: 0 % (ref 0–1)
Eosinophils Absolute: 0.1 10*3/uL (ref 0.0–0.7)
Eosinophils Relative: 2 % (ref 0–5)
HCT: 38.8 % (ref 36.0–46.0)
Hemoglobin: 12.7 g/dL (ref 12.0–15.0)
Lymphocytes Relative: 24 % (ref 12–46)
Lymphs Abs: 1.5 10*3/uL (ref 0.7–4.0)
MCH: 30 pg (ref 26.0–34.0)
MCHC: 32.7 g/dL (ref 30.0–36.0)
MCV: 91.7 fL (ref 78.0–100.0)
MPV: 10.3 fL (ref 8.6–12.4)
Monocytes Absolute: 0.6 10*3/uL (ref 0.1–1.0)
Monocytes Relative: 9 % (ref 3–12)
Neutro Abs: 4.1 10*3/uL (ref 1.7–7.7)
Neutrophils Relative %: 65 % (ref 43–77)
Platelets: 134 10*3/uL — ABNORMAL LOW (ref 150–400)
RBC: 4.23 MIL/uL (ref 3.87–5.11)
RDW: 13.4 % (ref 11.5–15.5)
WBC: 6.3 10*3/uL (ref 4.0–10.5)

## 2015-05-05 LAB — BASIC METABOLIC PANEL WITH GFR
BUN: 28 mg/dL — ABNORMAL HIGH (ref 7–25)
CO2: 31 mmol/L (ref 20–31)
Calcium: 9.7 mg/dL (ref 8.6–10.4)
Chloride: 106 mmol/L (ref 98–110)
Creat: 1.05 mg/dL — ABNORMAL HIGH (ref 0.60–0.93)
GFR, Est African American: 62 mL/min (ref >=60)
GFR, Est Non African American: 54 mL/min — ABNORMAL LOW (ref >=60)
Glucose, Bld: 72 mg/dL (ref 65–99)
Potassium: 4.1 mmol/L (ref 3.5–5.3)
Sodium: 144 mmol/L (ref 135–146)

## 2015-05-05 LAB — HEPATIC FUNCTION PANEL
ALT: 10 U/L (ref 6–29)
AST: 16 U/L (ref 10–35)
Albumin: 3.7 g/dL (ref 3.6–5.1)
Alkaline Phosphatase: 72 U/L (ref 33–130)
Bilirubin, Direct: 0.1 mg/dL (ref <=0.2)
Indirect Bilirubin: 0.4 mg/dL (ref 0.2–1.2)
Total Bilirubin: 0.5 mg/dL (ref 0.2–1.2)
Total Protein: 5.8 g/dL — ABNORMAL LOW (ref 6.1–8.1)

## 2015-05-05 MED ORDER — TRIAMCINOLONE ACETONIDE 0.1 % EX CREA: 1.0000 "application " | TOPICAL_CREAM | Freq: Two times a day (BID) | CUTANEOUS | Status: DC

## 2015-05-05 NOTE — Progress Notes (Signed)
Patient ID: Jillian Rodgers, female   DOB: 12-02-1945, 70 y.o.   MRN: [id number]  MEDICARE ANNUAL WELLNESS VISIT AND FOLLOW UP  Assessment:    1. Hypothyroidism, unspecified hypothyroidism type -cont levothyroxine - TSH  2. Essential hypertension -cont meds -monitor at home -DASH diet  3. Hyperlipidemia -cont diet and exercise -cont meds -monitor  4. Vitamin D deficiency -cont supplement  5. Prediabetes -diet and exercise  6. Medication management  - CBC with Differential/Platelet - BASIC METABOLIC PANEL WITH GFR - Hepatic function panel  7. Gastroesophageal reflux disease, esophagitis presence not specified -add in carafate prn -cont ranitidine  8. Osteoarthritis, unspecified osteoarthritis type, unspecified site -cont vit D3 and calcium  9. CKD Stage 3 (GFR 36 ml/min) -cont BS and BP  10. Upper airway cough syndrome -Likely from 7  11. BMI 26.71,  adult -exercise and diet counseling given  12. Encounter for general adult medical examination with abnormal findings  13.  Right Groin/abdominal pain -possible hernia given story -non contrasted CT given she has already had xrays and abdominal ultrasound -family history of spigelian hernia  Over 30 minutes of exam, counseling, chart review, and critical decision making was performed  Plan:   During the course of the visit the patient was educated and counseled about appropriate screening and preventive services including:    Pneumococcal vaccine   Influenza vaccine  Td vaccine  Prevnar 13  Screening electrocardiogram  Screening mammography  Bone densitometry screening  Colorectal cancer screening  Diabetes screening  Glaucoma screening  Nutrition counseling   Advanced directives: given info/requested copies  Conditions/risks identified: Diabetes is at goal, ACE/ARB therapy: No, Reason not on Ace Inhibitor/ARB therapy:  not indicated Urinary Incontinence is not an issue:  discussed non pharmacology and pharmacology options.  Fall risk: low- discussed PT, home fall assessment, medications.    Subjective:   Jillian Rodgers is a 70 y.o. female who presents for Medicare Annual Wellness Visit and 3 month follow up on hypertension, prediabetes, hyperlipidemia, vitamin D def.  Date of last medicare wellness visit is unknown.   Her blood pressure has been controlled at home, today their BP is BP: 120/72 mmHg She does workout. She denies chest pain, shortness of breath, dizziness.   She is on cholesterol medication and denies myalgias. Her cholesterol is at goal. The cholesterol last visit was:   Lab Results  Component Value Date   CHOL 159 01/28/2015   HDL 72 01/28/2015   LDLCALC 66 01/28/2015   TRIG 103 01/28/2015   CHOLHDL 2.2 01/28/2015   She has been working on diet and exercise for prediabetes, and denies foot ulcerations, hyperglycemia, hypoglycemia , increased appetite, nausea, paresthesia of the feet, polydipsia, polyuria, visual disturbances, vomiting and weight loss. Last A1C in the office was:  Lab Results  Component Value Date   HGBA1C 5.8* 01/28/2015   Last GFR NonAA   Lab Results  Component Value Date   GFRNONAA 44* 01/28/2015   AA  Lab Results  Component Value Date   GFRAA 62 01/28/2015   Patient is on Vitamin D supplement. Lab Results  Component Value Date   VD25OH 78 01/28/2015     Patient reports that she has been having some issues with her groin.  She reports that she has been to the orthopedist and her gynecologist and that she has had normal checkups.  She reports that the heat and resting seems to help her.  She does tend to have some gas and  some stomach pains which bother her separately from her groin pain.  She reports that sometimes she has a lump in that area.     She reports that she is also having some increase in her reflux.  She reports that this happened mainly over the holidays. She was taking ranitidine  and that didn't seem to help.  She was taking nexium prior to this.  She did not take any sort or antacid.  She has a prescription for carafate but didn't take it because she wasn't sure that was what it was for.     She also wants to have rough patches that have been happening on her right ankle. She is not sure how long that it has been there.     Medication Review Current Outpatient Prescriptions on File Prior to Visit  Medication Sig Dispense Refill  . Ascorbic Acid (VITAMIN C) 500 MG CAPS Take 1 capsule by mouth daily.    [name] Kitchen aspirin 81 MG tablet Take 81 mg by mouth daily.    . Cholecalciferol (VITAMIN D PO) Take 5,000 Units by mouth daily.    . furosemide (LASIX) 20 MG tablet Take 1 tablet (20 mg total) by mouth daily. 30 tablet 2  . gabapentin (NEURONTIN) 600 MG tablet Take 600 mg by mouth 4 (four) times daily.     [name] Kitchen levothyroxine (SYNTHROID) 50 MCG tablet Take 1 tablet every morning on an empty stomach for 30 minutes 90 tablet 1  . Magnesium 250 MG TABS Take 1 tablet by mouth daily.    [name] Kitchen OVER THE COUNTER MEDICATION Taking Tangy Tangerine tabs 2.0 # 2 tabs daily.    . pravastatin (PRAVACHOL) 40 MG tablet take 1 tablet by mouth at bedtime FOR CHOLESTEROL. 90 tablet 4  . ranitidine (ZANTAC) 300 MG tablet take 1 tablet by mouth twice a day FOR ACID INDIGESTION AND REFLUX 180 tablet 1   No current facility-administered medications on file prior to visit.    Current Problems (verified) Patient Active Problem List   Diagnosis Date Noted  . BMI 26.71,  adult 01/28/2015  . CKD Stage 3 (GFR 36 ml/min) 01/19/2014  . Upper airway cough syndrome 12/16/2013  . Vitamin D deficiency 05/21/2013  . Prediabetes 05/21/2013  . Medication management 05/21/2013  . Essential hypertension 01/31/2013  . Degenerative joint disease   . GERD (gastroesophageal reflux disease)   . Hyperlipidemia   . Hypothyroidism     Screening Tests Immunization History  Administered Date(s) Administered  .  Influenza, High Dose Seasonal PF 12/29/2013, 01/15/2015  . Influenza-Unspecified 01/24/2011, 02/02/2012  . Pneumococcal Conjugate-13 10/28/2014  . Pneumococcal-Unspecified 11/28/2011  . Td 11/17/2010  . Tdap 11/12/2014  . Zoster 11/17/2010    Preventative care: Last colonoscopy: 2009 Last mammogram: 2016 DEXA:2015  Prior vaccinations: TD or Tdap: 2016  Influenza: 2016  Pneumococcal: 2013 Prevnar13: 2016 Shingles/Zostavax: 2016  Names of Other Physician/Practitioners you currently use: 1. [facility]- here for primary care 2. Dr. Gershon Crane, eye doctor, last visit 2016 3. Dr. Mauro Kaufmann, dentist, last visit 2016 Patient Care Team: Unk Pinto, MD as PCP - General (Internal Medicine)  Past Surgical History  Procedure Laterality Date  . Ventral hernia repair    . Cholecystectomy  1996    open  . Pilonidal cyst excision  1972  . Tonsillectomy    . Colonoscopy    . Upper gi endoscopy     Family History  Problem Relation Age of Onset  . Ovarian cancer Mother   .  Breast cancer Sister   . Thyroid cancer Sister   . Stroke Paternal Uncle    Social History  Substance Use Topics  . Smoking status: Never Smoker   . Smokeless tobacco: Never Used  . Alcohol Use: No    MEDICARE WELLNESS OBJECTIVES: Tobacco use: She does not smoke.  Patient is not a former smoker. If yes, counseling given Alcohol Current alcohol use: none Osteoporosis: postmenopausal estrogen deficiency, History of fracture in the past year: no Fall risk: Low Risk Hearing: normal Visual acuity: normal,  does perform annual eye exam Diet: in general, a "healthy" diet   Physical activity:   Cardiac risk factors:   Depression/mood screen:   Depression screen PHQ 2/9 01/28/2015  Decreased Interest 0  Down, Depressed, Hopeless 0  PHQ - 2 Score 0    ADLs:  In your present state of health, do you have any difficulty performing the following activities: 01/28/2015  10/28/2014  Hearing? N N  Vision? N N  Difficulty concentrating or making decisions? N N  Walking or climbing stairs? N N  Dressing or bathing? N N  Doing errands, shopping? N N  Preparing Food and eating ? - N  Using the Toilet? - N  In the past six months, have you accidently leaked urine? - N  Do you have problems with loss of bowel control? - N  Managing your Medications? - N  Managing your Finances? - N  Housekeeping or managing your Housekeeping? - N     Cognitive Testing  Alert? Yes  Normal Appearance?Yes  Oriented to person? Yes  Place? Yes   Time? Yes  Recall of three objects?  Yes  Can perform simple calculations? Yes  Displays appropriate judgment?Yes  Can read the correct time from a watch face?Yes  EOL planning:     Objective:   Today's Vitals   05/05/15 0944  BP: 120/72  Pulse: 78  Temp: 98 F (36.7 C)  TempSrc: Temporal  Resp: 16  Height: 5' 3.75" (1.619 m)  Weight: 156 lb (70.761 kg)   Body mass index is 27 kg/(m^2).  General appearance: alert, no distress, WD/WN,  female HEENT: normocephalic, sclerae anicteric, TMs pearly, nares patent, no discharge or erythema, pharynx normal Oral cavity: MMM, no lesions Neck: supple, no lymphadenopathy, no thyromegaly, no masses Heart: RRR, normal S1, S2, no murmurs Lungs: CTA bilaterally, no wheezes, rhonchi, or rales Abdomen: +bs, soft, non tender, non distended, no masses, no hepatomegaly, no splenomegaly Musculoskeletal: nontender, no swelling, no obvious deformity Extremities: no edema, no cyanosis, no clubbing Pulses: 2+ symmetric, upper and lower extremities, normal cap refill Neurological: alert, oriented x 3, CN2-12 intact, strength normal upper extremities and lower extremities, sensation normal throughout, DTRs 2+ throughout, no cerebellar signs, gait normal Psychiatric: normal affect, behavior normal, pleasant  Breast: defer Gyn: defer Rectal: defer   Medicare Attestation I have personally  reviewed: The patient's medical and social history Their use of alcohol, tobacco or illicit drugs Their current medications and supplements The patient's functional ability including ADLs,fall risks, home safety risks, cognitive, and hearing and visual impairment Diet and physical activities Evidence for depression or mood disorders  The patient's weight, height, BMI, and visual acuity have been recorded in the chart.  I have made referrals, counseling, and provided education to the patient based on review of the above and I have provided the patient with a written personalized care plan for preventive services.     Starlyn Skeans, PA-C   05/05/2015

## 2015-05-06 LAB — TSH: TSH: 0.971 u[IU]/mL (ref 0.350–4.500)

## 2015-05-11 ENCOUNTER — Ambulatory Visit (HOSPITAL_COMMUNITY)
Admission: RE | Admit: 2015-05-11 | Discharge: 2015-05-11 | Disposition: A | Discharge status: Home / Self Care | Payer: Medicare Other | Source: Ambulatory Visit | Attending: Internal Medicine | Admitting: Internal Medicine

## 2015-05-11 DIAGNOSIS — K449 Diaphragmatic hernia without obstruction or gangrene: Secondary | ICD-10-CM | POA: Diagnosis not present

## 2015-05-11 DIAGNOSIS — R1031 Right lower quadrant pain: Secondary | ICD-10-CM | POA: Diagnosis present

## 2015-05-11 DIAGNOSIS — N289 Disorder of kidney and ureter, unspecified: Secondary | ICD-10-CM | POA: Diagnosis not present

## 2015-06-02 ENCOUNTER — Other Ambulatory Visit: Payer: Self-pay | Admitting: Internal Medicine

## 2015-06-14 ENCOUNTER — Other Ambulatory Visit: Payer: Self-pay

## 2015-06-14 ENCOUNTER — Ambulatory Visit (INDEPENDENT_AMBULATORY_CARE_PROVIDER_SITE_OTHER): Payer: Medicare Other | Admitting: Physician Assistant

## 2015-06-14 ENCOUNTER — Encounter: Payer: Self-pay | Admitting: Physician Assistant

## 2015-06-14 VITALS — BP 126/80 | HR 73 | Temp 97.5°F | Resp 16 | Ht 63.75 in | Wt 158.2 lb

## 2015-06-14 DIAGNOSIS — Z0000 Encounter for general adult medical examination without abnormal findings: Secondary | ICD-10-CM | POA: Insufficient documentation

## 2015-06-14 DIAGNOSIS — S20219D Contusion of unspecified front wall of thorax, subsequent encounter: Secondary | ICD-10-CM

## 2015-06-14 DIAGNOSIS — R0789 Other chest pain: Secondary | ICD-10-CM

## 2015-06-14 DIAGNOSIS — J0100 Acute maxillary sinusitis, unspecified: Secondary | ICD-10-CM | POA: Diagnosis not present

## 2015-06-14 DIAGNOSIS — R071 Chest pain on breathing: Secondary | ICD-10-CM

## 2015-06-14 MED ORDER — AZITHROMYCIN 250 MG PO TABS: ORAL_TABLET | ORAL | Status: AC

## 2015-06-14 MED ORDER — BENZONATATE 100 MG PO CAPS: 200.0000 mg | ORAL_CAPSULE | Freq: Three times a day (TID) | ORAL | Status: DC | PRN

## 2015-06-14 MED ORDER — PREDNISONE 20 MG PO TABS: ORAL_TABLET | ORAL | Status: AC

## 2015-06-14 MED ORDER — ACETAMINOPHEN-CODEINE #3 300-30 MG PO TABS: 1.0000 | ORAL_TABLET | Freq: Four times a day (QID) | ORAL | Status: DC | PRN

## 2015-06-14 NOTE — Patient Instructions (Signed)
Chest Contusion A chest contusion is a deep bruise on your chest area. Contusions are the result of an injury that caused bleeding under the skin. A chest contusion may involve bruising of the skin, muscles, or ribs. The contusion may turn blue, purple, or yellow. Minor injuries will give you a painless contusion, but more severe contusions may stay painful and swollen for a few weeks. CAUSES  A contusion is usually caused by a blow, trauma, or direct force to an area of the body. SYMPTOMS   Swelling and redness of the injured area.  Discoloration of the injured area.  Tenderness and soreness of the injured area.  Pain. DIAGNOSIS  The diagnosis can be made by taking a history and performing a physical exam. An X-ray, CT scan, or MRI may be needed to determine if there were any associated injuries, such as broken bones (fractures) or internal injuries. TREATMENT  Often, the best treatment for a chest contusion is resting, icing, and applying cold compresses to the injured area. Deep breathing exercises may be recommended to reduce the risk of pneumonia. Over-the-counter medicines may also be recommended for pain control. HOME CARE INSTRUCTIONS   Put ice on the injured area.  Put ice in a plastic bag.  Place a towel between your skin and the bag.  Leave the ice on for 15-20 minutes, 03-04 times a day.  Only take over-the-counter or prescription medicines as directed by your caregiver. Your caregiver may recommend avoiding anti-inflammatory medicines (aspirin, ibuprofen, and naproxen) for 48 hours because these medicines may increase bruising.  Rest the injured area.  Perform deep-breathing exercises as directed by your caregiver.  Stop smoking if you smoke.  Do not lift objects over 5 pounds (2.3 kg) for 3 days or longer if recommended by your caregiver. SEEK IMMEDIATE MEDICAL CARE IF:   You have increased bruising or swelling.  You have pain that is getting worse.  You have  difficulty breathing.  You have dizziness, weakness, or fainting.  You have blood in your urine or stool.  You cough up or vomit blood.  Your swelling or pain is not relieved with medicines. MAKE SURE YOU:   Understand these instructions.  Will watch your condition.  Will get help right away if you are not doing well or get worse.   This information is not intended to replace advice given to you by your health care provider. Make sure you discuss any questions you have with your health care provider.   Document Released: 12/27/2000 Document Revised: 12/27/2011 Document Reviewed: 09/25/2011 Elsevier Interactive Patient Education 2016 [employer] Costochondritis Costochondritis, sometimes called Tietze syndrome, is a swelling and irritation (inflammation) of the tissue (cartilage) that connects your ribs with your breastbone (sternum). It causes pain in the chest and rib area. Costochondritis usually goes away on its own over time. It can take up to 6 weeks or longer to get better, especially if you are unable to limit your activities. CAUSES  Some cases of costochondritis have no known cause. Possible causes include:  Injury (trauma).  Exercise or activity such as lifting.  Severe coughing. SIGNS AND SYMPTOMS  Pain and tenderness in the chest and rib area.  Pain that gets worse when coughing or taking deep breaths.  Pain that gets worse with specific movements. DIAGNOSIS  Your health care provider will do a physical exam and ask about your symptoms. Chest X-rays or other tests may be done to rule out other problems. TREATMENT  Costochondritis usually goes  away on its own over time. Your health care provider may prescribe medicine to help relieve pain. HOME CARE INSTRUCTIONS   Avoid exhausting physical activity. Try not to strain your ribs during normal activity. This would include any activities using chest, abdominal, and side muscles, especially if heavy weights are  used.  Apply ice to the affected area for the first 2 days after the pain begins.  Put ice in a plastic bag.  Place a towel between your skin and the bag.  Leave the ice on for 20 minutes, 2-3 times a day.  Only take over-the-counter or prescription medicines as directed by your health care provider. SEEK MEDICAL CARE IF:  You have redness or swelling at the rib joints. These are signs of infection.  Your pain does not go away despite rest or medicine. SEEK IMMEDIATE MEDICAL CARE IF:   Your pain increases or you are very uncomfortable.  You have shortness of breath or difficulty breathing.  You cough up blood.  You have worse chest pains, sweating, or vomiting.  You have a fever or persistent symptoms for more than 2-3 days.  You have a fever and your symptoms suddenly get worse. MAKE SURE YOU:   Understand these instructions.  Will watch your condition.  Will get help right away if you are not doing well or get worse.   This information is not intended to replace advice given to you by your health care provider. Make sure you discuss any questions you have with your health care provider.   Document Released: 01/11/2005 Document Revised: 01/22/2013 Document Reviewed: 11/05/2012 Elsevier Interactive Patient Education [employer].
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
## 2015-06-15 ENCOUNTER — Other Ambulatory Visit: Payer: Self-pay | Admitting: Physician Assistant

## 2015-06-16 ENCOUNTER — Other Ambulatory Visit: Payer: Self-pay | Admitting: Physician Assistant

## 2015-06-16 DIAGNOSIS — E041 Nontoxic single thyroid nodule: Secondary | ICD-10-CM

## 2015-06-16 NOTE — Progress Notes (Signed)
Spoke with Jillian Rodgers about having a [place]. Pt understood & will be waiting for someone to call her.

## 2015-06-21 ENCOUNTER — Ambulatory Visit
Admission: RE | Admit: 2015-06-21 | Discharge: 2015-06-21 | Disposition: A | Discharge status: Home / Self Care | Payer: Medicare Other | Source: Ambulatory Visit | Attending: Physician Assistant | Admitting: Physician Assistant

## 2015-06-21 DIAGNOSIS — E041 Nontoxic single thyroid nodule: Secondary | ICD-10-CM

## 2015-06-25 ENCOUNTER — Other Ambulatory Visit: Payer: Self-pay | Admitting: Physician Assistant

## 2015-06-25 MED ORDER — ACETAMINOPHEN-CODEINE #3 300-30 MG PO TABS: 1.0000 | ORAL_TABLET | Freq: Four times a day (QID) | ORAL | Status: DC | PRN

## 2015-07-13 ENCOUNTER — Other Ambulatory Visit: Payer: Self-pay | Admitting: Internal Medicine

## 2015-07-13 ENCOUNTER — Other Ambulatory Visit: Payer: Self-pay

## 2015-07-13 DIAGNOSIS — R079 Chest pain, unspecified: Secondary | ICD-10-CM

## 2015-07-13 MED ORDER — IBUPROFEN 800 MG PO TABS: 800.0000 mg | ORAL_TABLET | Freq: Three times a day (TID) | ORAL | Status: DC

## 2015-07-13 MED ORDER — TRAMADOL HCL 50 MG PO TABS
50.0000 mg | ORAL_TABLET | Freq: Four times a day (QID) | ORAL | Status: DC | PRN
Start: 2015-07-13 — End: 2015-07-22

## 2015-07-22 ENCOUNTER — Ambulatory Visit (HOSPITAL_COMMUNITY)
Admission: RE | Admit: 2015-07-22 | Discharge: 2015-07-22 | Disposition: A | Discharge status: Home / Self Care | Payer: Medicare Other | Source: Ambulatory Visit | Attending: Physician Assistant | Admitting: Physician Assistant

## 2015-07-22 ENCOUNTER — Ambulatory Visit (INDEPENDENT_AMBULATORY_CARE_PROVIDER_SITE_OTHER): Payer: Medicare Other | Admitting: Physician Assistant

## 2015-07-22 ENCOUNTER — Encounter: Payer: Self-pay | Admitting: Physician Assistant

## 2015-07-22 ENCOUNTER — Other Ambulatory Visit: Payer: Self-pay

## 2015-07-22 VITALS — BP 128/74 | HR 69 | Temp 97.3°F | Resp 16 | Ht 63.75 in | Wt 154.4 lb

## 2015-07-22 DIAGNOSIS — R0789 Other chest pain: Secondary | ICD-10-CM | POA: Diagnosis present

## 2015-07-22 DIAGNOSIS — R071 Chest pain on breathing: Secondary | ICD-10-CM

## 2015-07-22 DIAGNOSIS — Z9049 Acquired absence of other specified parts of digestive tract: Secondary | ICD-10-CM | POA: Diagnosis not present

## 2015-07-22 DIAGNOSIS — M25531 Pain in right wrist: Secondary | ICD-10-CM | POA: Diagnosis not present

## 2015-07-22 DIAGNOSIS — S20219D Contusion of unspecified front wall of thorax, subsequent encounter: Secondary | ICD-10-CM

## 2015-07-22 MED ORDER — ERYTHROMYCIN 2 % EX SOLN: Freq: Every day | CUTANEOUS | Status: DC
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
## 2015-07-22 NOTE — Patient Instructions (Signed)
Wrist Pain There are many things that can cause wrist pain. Some common causes include:  An injury to the wrist area, such as a sprain, strain, or fracture.  Overuse of the joint.  A condition that causes increased pressure on a nerve in the wrist (carpal tunnel syndrome).  Wear and tear of the joints that occurs with aging (osteoarthritis).  A variety of other types of arthritis. Sometimes, the cause of wrist pain is not known. The pain often goes away when you follow your health care provider's instructions for relieving pain at home. If your wrist pain continues, tests may need to be done to diagnose your condition. HOME CARE INSTRUCTIONS Pay attention to any changes in your symptoms. Take these actions to help with your pain:  Rest the wrist area for at least 48 hours or as told by your health care provider.  If directed, apply ice to the injured area:  Put ice in a plastic bag.  Place a towel between your skin and the bag.  Leave the ice on for 20 minutes, 2-3 times per day.  Keep your arm raised (elevated) above the level of your heart while you are sitting or lying down.  If a splint or elastic bandage has been applied, use it as told by your health care provider.  Remove the splint or bandage only as told by your health care provider.  Loosen the splint or bandage if your fingers become numb or have a tingling feeling, or if they turn cold or blue.  Take over-the-counter and prescription medicines only as told by your health care provider.  Keep all follow-up visits as told by your health care provider. This is important. SEEK MEDICAL CARE IF:  Your pain is not helped by treatment.  Your pain gets worse. SEEK IMMEDIATE MEDICAL CARE IF:  Your fingers become swollen.  Your fingers turn white, very red, or cold and blue.  Your fingers are numb or have a tingling feeling.  You have difficulty moving your fingers.   This information is not intended to replace  advice given to you by your health care provider. Make sure you discuss any questions you have with your health care provider.   Document Released: 01/11/2005 Document Revised: 12/23/2014 Document Reviewed: 08/19/2014 Elsevier Interactive Patient Education 2016 Elsevier Inc. Costochondritis Costochondritis, sometimes called Tietze syndrome, is a swelling and irritation (inflammation) of the tissue (cartilage) that connects your ribs with your breastbone (sternum). It causes pain in the chest and rib area. Costochondritis usually goes away on its own over time. It can take up to 6 weeks or longer to get better, especially if you are unable to limit your activities. CAUSES  Some cases of costochondritis have no known cause. Possible causes include:  Injury (trauma).  Exercise or activity such as lifting.  Severe coughing. SIGNS AND SYMPTOMS  Pain and tenderness in the chest and rib area.  Pain that gets worse when coughing or taking deep breaths.  Pain that gets worse with specific movements. DIAGNOSIS  Your health care provider will do a physical exam and ask about your symptoms. Chest X-rays or other tests may be done to rule out other problems. TREATMENT  Costochondritis usually goes away on its own over time. Your health care provider may prescribe medicine to help relieve pain. HOME CARE INSTRUCTIONS   Avoid exhausting physical activity. Try not to strain your ribs during normal activity. This would include any activities using chest, abdominal, and side muscles, especially if heavy  weights are used.  Apply ice to the affected area for the first 2 days after the pain begins.  Put ice in a plastic bag.  Place a towel between your skin and the bag.  Leave the ice on for 20 minutes, 2-3 times a day.  Only take over-the-counter or prescription medicines as directed by your health care provider. SEEK MEDICAL CARE IF:  You have redness or swelling at the rib joints. These are  signs of infection.  Your pain does not go away despite rest or medicine. SEEK IMMEDIATE MEDICAL CARE IF:   Your pain increases or you are very uncomfortable.  You have shortness of breath or difficulty breathing.  You cough up blood.  You have worse chest pains, sweating, or vomiting.  You have a fever or persistent symptoms for more than 2-3 days.  You have a fever and your symptoms suddenly get worse. MAKE SURE YOU:   Understand these instructions.  Will watch your condition.  Will get help right away if you are not doing well or get worse.   This information is not intended to replace advice given to you by your health care provider. Make sure you discuss any questions you have with your health care provider.   Document Released: 01/11/2005 Document Revised: 01/22/2013 Document Reviewed: 11/05/2012 Elsevier Interactive Patient Education [employer].

## 2015-08-04 ENCOUNTER — Other Ambulatory Visit: Payer: Self-pay | Admitting: *Deleted

## 2015-08-04 ENCOUNTER — Ambulatory Visit (INDEPENDENT_AMBULATORY_CARE_PROVIDER_SITE_OTHER): Payer: Medicare Other | Admitting: Internal Medicine

## 2015-08-04 ENCOUNTER — Encounter: Payer: Self-pay | Admitting: Internal Medicine

## 2015-08-04 VITALS — BP 142/80 | HR 64 | Temp 97.6°F | Resp 16 | Ht 63.75 in | Wt 156.0 lb

## 2015-08-04 DIAGNOSIS — Z79899 Other long term (current) drug therapy: Secondary | ICD-10-CM

## 2015-08-04 DIAGNOSIS — R7303 Prediabetes: Secondary | ICD-10-CM

## 2015-08-04 DIAGNOSIS — E039 Hypothyroidism, unspecified: Secondary | ICD-10-CM

## 2015-08-04 DIAGNOSIS — I10 Essential (primary) hypertension: Secondary | ICD-10-CM

## 2015-08-04 DIAGNOSIS — E785 Hyperlipidemia, unspecified: Secondary | ICD-10-CM | POA: Diagnosis not present

## 2015-08-04 DIAGNOSIS — E559 Vitamin D deficiency, unspecified: Secondary | ICD-10-CM

## 2015-08-04 LAB — HEMOGLOBIN A1C
Hgb A1c MFr Bld: 5.5 % (ref <5.7)
Mean Plasma Glucose: 111 mg/dL

## 2015-08-04 LAB — CBC WITH DIFFERENTIAL/PLATELET
Basophils Absolute: 64 cells/uL (ref 0–200)
Basophils Relative: 1 %
Eosinophils Absolute: 128 cells/uL (ref 15–500)
Eosinophils Relative: 2 %
HCT: 36.8 % (ref 35.0–45.0)
Hemoglobin: 12.2 g/dL (ref 11.7–15.5)
Lymphocytes Relative: 23 %
Lymphs Abs: 1472 cells/uL (ref 850–3900)
MCH: 29.8 pg (ref 27.0–33.0)
MCHC: 33.2 g/dL (ref 32.0–36.0)
MCV: 90 fL (ref 80.0–100.0)
MPV: 10.4 fL (ref 7.5–12.5)
Monocytes Absolute: 512 cells/uL (ref 200–950)
Monocytes Relative: 8 %
Neutro Abs: 4224 cells/uL (ref 1500–7800)
Neutrophils Relative %: 66 %
Platelets: 113 10*3/uL — ABNORMAL LOW (ref 140–400)
RBC: 4.09 MIL/uL (ref 3.80–5.10)
RDW: 13.6 % (ref 11.0–15.0)
WBC: 6.4 10*3/uL (ref 3.8–10.8)

## 2015-08-04 LAB — LIPID PANEL
Cholesterol: 167 mg/dL (ref 125–200)
HDL: 60 mg/dL (ref >=46)
LDL Cholesterol: 82 mg/dL (ref <130)
Total CHOL/HDL Ratio: 2.8 Ratio (ref <=5.0)
Triglycerides: 126 mg/dL (ref <150)
VLDL: 25 mg/dL (ref <30)

## 2015-08-04 LAB — BASIC METABOLIC PANEL WITH GFR
BUN: 34 mg/dL — ABNORMAL HIGH (ref 7–25)
CO2: 26 mmol/L (ref 20–31)
Calcium: 9.8 mg/dL (ref 8.6–10.4)
Chloride: 106 mmol/L (ref 98–110)
Creat: 1.48 mg/dL — ABNORMAL HIGH (ref 0.60–0.93)
GFR, Est African American: 41 mL/min — ABNORMAL LOW (ref >=60)
GFR, Est Non African American: 35 mL/min — ABNORMAL LOW (ref >=60)
Glucose, Bld: 68 mg/dL (ref 65–99)
Potassium: 3.8 mmol/L (ref 3.5–5.3)
Sodium: 144 mmol/L (ref 135–146)

## 2015-08-04 LAB — HEPATIC FUNCTION PANEL
ALT: 13 U/L (ref 6–29)
AST: 16 U/L (ref 10–35)
Albumin: 3.8 g/dL (ref 3.6–5.1)
Alkaline Phosphatase: 68 U/L (ref 33–130)
Bilirubin, Direct: 0.1 mg/dL (ref <=0.2)
Indirect Bilirubin: 0.4 mg/dL (ref 0.2–1.2)
Total Bilirubin: 0.5 mg/dL (ref 0.2–1.2)
Total Protein: 6 g/dL — ABNORMAL LOW (ref 6.1–8.1)

## 2015-08-04 LAB — TSH: TSH: 2.11 mIU/L

## 2015-08-04 LAB — MAGNESIUM: Magnesium: 2.1 mg/dL (ref 1.5–2.5)

## 2015-08-04 MED ORDER — ERYTHROMYCIN 2 % EX SOLN: Freq: Every day | CUTANEOUS | Status: DC
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
## 2015-08-04 NOTE — Patient Instructions (Signed)

## 2015-08-05 ENCOUNTER — Other Ambulatory Visit: Payer: Self-pay | Admitting: Internal Medicine

## 2015-08-05 DIAGNOSIS — N289 Disorder of kidney and ureter, unspecified: Secondary | ICD-10-CM

## 2015-08-05 DIAGNOSIS — Z79899 Other long term (current) drug therapy: Secondary | ICD-10-CM

## 2015-08-05 LAB — VITAMIN D 25 HYDROXY (VIT D DEFICIENCY, FRACTURES): Vit D, 25-Hydroxy: 78 ng/mL (ref 30–100)

## 2015-08-05 LAB — INSULIN, RANDOM: Insulin: 7.5 u[IU]/mL (ref 2.0–19.6)

## 2015-08-21 IMAGING — CR DG CHEST 2V
2 series · 2 of 2 positions shown · non-contrast
Comparison: 12/29/2013, 12/11/2013

CLINICAL DATA: Community acquired pneumonia

EXAM:
CHEST - 2 VIEW

[view not recorded (1 of 2)]
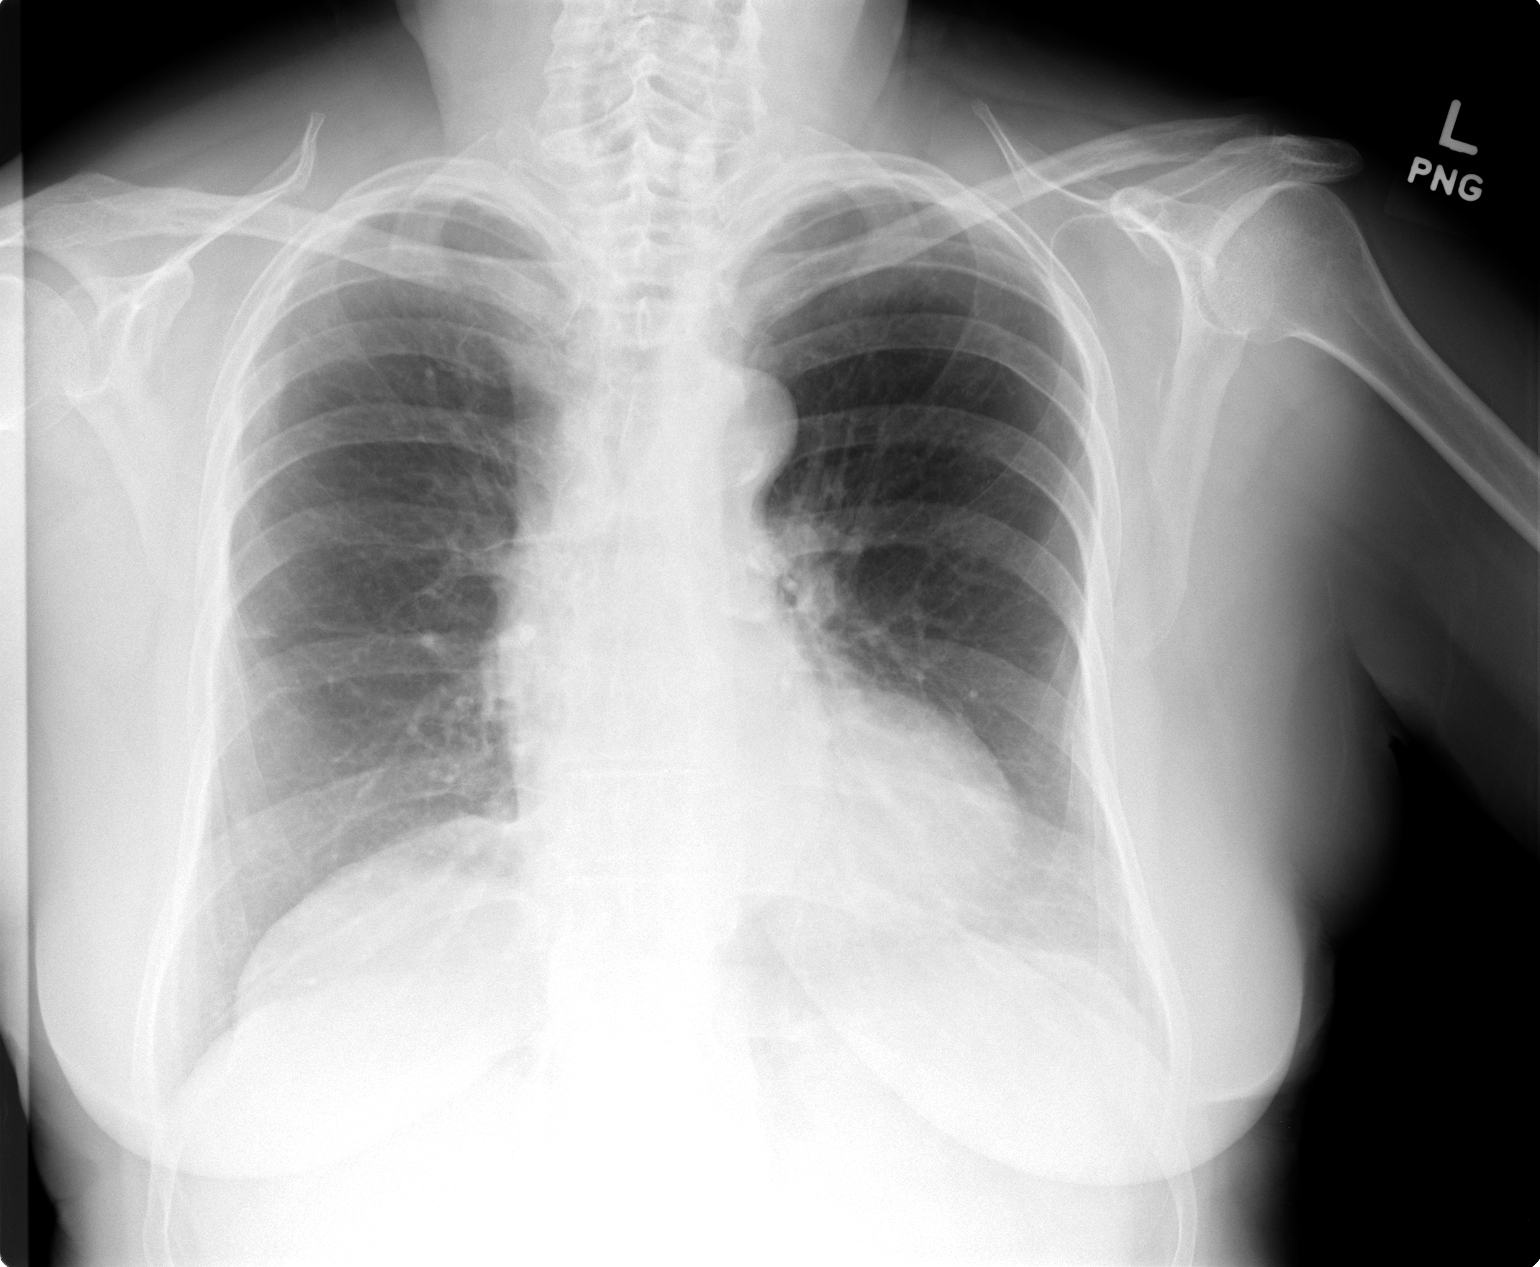

[view not recorded (2 of 2)]
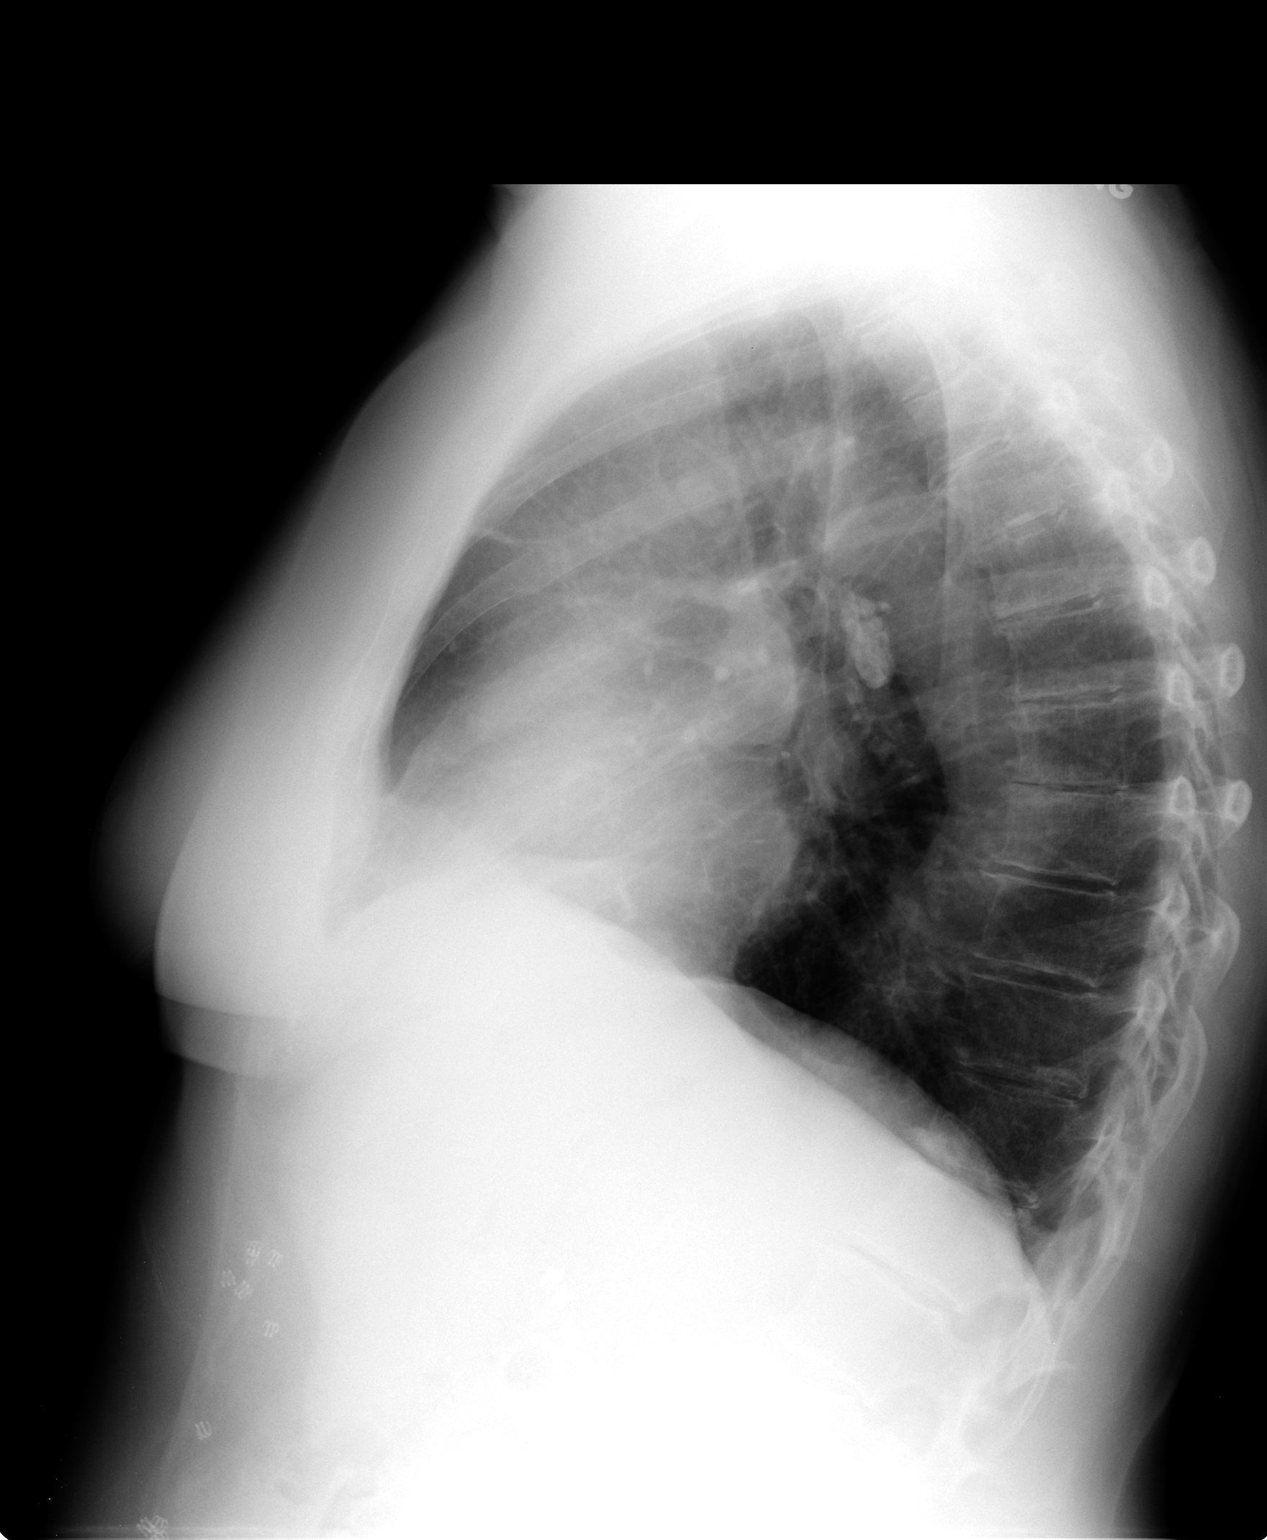

[2 of 2 positions shown; findings below may reference images not displayed]

FINDINGS: Improvement in the peripheral right upper and midlung subpleural
nodular densities. Minor thickening along the right minor fissure
laterally. Stable heart size and vascularity. Thoracic aorta is
atherosclerotic and tortuous. No effusion or pneumothorax. Trachea
midline. Degenerative changes of the thoracic spine. Prior
cholecystectomy noted. Left hilar calcified lymph nodes present.
IMPRESSION: Resolving peripheral right lung nodular opacities. No current acute
process.

Remote granulomatous disease.

## 2015-08-31 ENCOUNTER — Ambulatory Visit (INDEPENDENT_AMBULATORY_CARE_PROVIDER_SITE_OTHER): Payer: Medicare Other | Admitting: *Deleted

## 2015-08-31 ENCOUNTER — Other Ambulatory Visit: Payer: Self-pay | Admitting: Internal Medicine

## 2015-08-31 DIAGNOSIS — Z79899 Other long term (current) drug therapy: Secondary | ICD-10-CM | POA: Diagnosis not present

## 2015-08-31 DIAGNOSIS — N184 Chronic kidney disease, stage 4 (severe): Secondary | ICD-10-CM

## 2015-08-31 LAB — BASIC METABOLIC PANEL WITH GFR
BUN: 37 mg/dL — ABNORMAL HIGH (ref 7–25)
CO2: 28 mmol/L (ref 20–31)
Calcium: 10.1 mg/dL (ref 8.6–10.4)
Chloride: 103 mmol/L (ref 98–110)
Creat: 1.53 mg/dL — ABNORMAL HIGH (ref 0.60–0.93)
GFR, Est African American: 39 mL/min — ABNORMAL LOW (ref >=60)
GFR, Est Non African American: 34 mL/min — ABNORMAL LOW (ref >=60)
Glucose, Bld: 142 mg/dL — ABNORMAL HIGH (ref 65–99)
Potassium: 4 mmol/L (ref 3.5–5.3)
Sodium: 141 mmol/L (ref 135–146)

## 2015-09-02 ENCOUNTER — Ambulatory Visit (HOSPITAL_COMMUNITY)
Admission: RE | Admit: 2015-09-02 | Discharge: 2015-09-02 | Disposition: A | Discharge status: Home / Self Care | Payer: Medicare Other | Source: Ambulatory Visit | Attending: Physician Assistant | Admitting: Physician Assistant

## 2015-09-02 ENCOUNTER — Ambulatory Visit (INDEPENDENT_AMBULATORY_CARE_PROVIDER_SITE_OTHER): Payer: Medicare Other | Admitting: Physician Assistant

## 2015-09-02 ENCOUNTER — Encounter: Payer: Self-pay | Admitting: Physician Assistant

## 2015-09-02 VITALS — BP 132/68 | HR 70 | Temp 97.3°F | Resp 16 | Ht 63.75 in | Wt 155.6 lb

## 2015-09-02 DIAGNOSIS — W19XXXA Unspecified fall, initial encounter: Secondary | ICD-10-CM

## 2015-09-02 DIAGNOSIS — M25531 Pain in right wrist: Secondary | ICD-10-CM

## 2015-09-02 DIAGNOSIS — M25431 Effusion, right wrist: Secondary | ICD-10-CM | POA: Diagnosis present

## 2015-09-02 DIAGNOSIS — S93401A Sprain of unspecified ligament of right ankle, initial encounter: Secondary | ICD-10-CM

## 2015-09-02 DIAGNOSIS — K219 Gastro-esophageal reflux disease without esophagitis: Secondary | ICD-10-CM | POA: Diagnosis not present

## 2015-09-02 DIAGNOSIS — M25562 Pain in left knee: Secondary | ICD-10-CM | POA: Diagnosis not present

## 2015-09-02 DIAGNOSIS — M19031 Primary osteoarthritis, right wrist: Secondary | ICD-10-CM | POA: Diagnosis not present

## 2015-09-02 MED ORDER — MELOXICAM 15 MG PO TABS: ORAL_TABLET | ORAL | Status: DC
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
## 2015-09-02 NOTE — Patient Instructions (Signed)

## 2015-09-05 ENCOUNTER — Encounter: Payer: Self-pay | Admitting: *Deleted

## 2015-09-06 ENCOUNTER — Other Ambulatory Visit: Payer: Self-pay | Admitting: Physician Assistant

## 2015-09-21 ENCOUNTER — Ambulatory Visit (INDEPENDENT_AMBULATORY_CARE_PROVIDER_SITE_OTHER): Payer: Medicare Other | Admitting: Physician Assistant

## 2015-09-21 ENCOUNTER — Encounter: Payer: Self-pay | Admitting: Physician Assistant

## 2015-09-21 VITALS — BP 114/66 | HR 66 | Temp 98.0°F | Resp 18 | Ht 63.75 in | Wt 154.0 lb

## 2015-09-21 DIAGNOSIS — R058 Other specified cough: Secondary | ICD-10-CM

## 2015-09-21 DIAGNOSIS — R05 Cough: Secondary | ICD-10-CM | POA: Diagnosis not present

## 2015-09-21 DIAGNOSIS — K219 Gastro-esophageal reflux disease without esophagitis: Secondary | ICD-10-CM

## 2015-09-21 DIAGNOSIS — N183 Chronic kidney disease, stage 3 unspecified: Secondary | ICD-10-CM

## 2015-09-21 LAB — HEPATIC FUNCTION PANEL
ALT: 12 U/L (ref 6–29)
AST: 17 U/L (ref 10–35)
Albumin: 4.2 g/dL (ref 3.6–5.1)
Alkaline Phosphatase: 79 U/L (ref 33–130)
Bilirubin, Direct: 0.1 mg/dL (ref <=0.2)
Indirect Bilirubin: 0.5 mg/dL (ref 0.2–1.2)
Total Bilirubin: 0.6 mg/dL (ref 0.2–1.2)
Total Protein: 6.2 g/dL (ref 6.1–8.1)

## 2015-09-21 LAB — BASIC METABOLIC PANEL WITH GFR
BUN: 22 mg/dL (ref 7–25)
CO2: 27 mmol/L (ref 20–31)
Calcium: 10.4 mg/dL (ref 8.6–10.4)
Chloride: 105 mmol/L (ref 98–110)
Creat: 1.25 mg/dL — ABNORMAL HIGH (ref 0.60–0.93)
GFR, Est African American: 50 mL/min — ABNORMAL LOW (ref >=60)
GFR, Est Non African American: 43 mL/min — ABNORMAL LOW (ref >=60)
Glucose, Bld: 62 mg/dL — ABNORMAL LOW (ref 65–99)
Potassium: 4.1 mmol/L (ref 3.5–5.3)
Sodium: 142 mmol/L (ref 135–146)

## 2015-09-21 LAB — CBC WITH DIFFERENTIAL/PLATELET
Basophils Absolute: 0 cells/uL (ref 0–200)
Basophils Relative: 0 %
Eosinophils Absolute: 130 cells/uL (ref 15–500)
Eosinophils Relative: 2 %
HCT: 37.5 % (ref 35.0–45.0)
Hemoglobin: 12.4 g/dL (ref 11.7–15.5)
Lymphocytes Relative: 23 %
Lymphs Abs: 1495 cells/uL (ref 850–3900)
MCH: 29.7 pg (ref 27.0–33.0)
MCHC: 33.1 g/dL (ref 32.0–36.0)
MCV: 89.9 fL (ref 80.0–100.0)
MPV: 10.2 fL (ref 7.5–12.5)
Monocytes Absolute: 715 cells/uL (ref 200–950)
Monocytes Relative: 11 %
Neutro Abs: 4160 cells/uL (ref 1500–7800)
Neutrophils Relative %: 64 %
Platelets: 124 10*3/uL — ABNORMAL LOW (ref 140–400)
RBC: 4.17 MIL/uL (ref 3.80–5.10)
RDW: 13.5 % (ref 11.0–15.0)
WBC: 6.5 10*3/uL (ref 3.8–10.8)

## 2015-09-21 MED ORDER — TRAMADOL HCL 50 MG PO TABS: ORAL_TABLET | ORAL | Status: DC

## 2015-09-21 MED ORDER — PREDNISONE 20 MG PO TABS: ORAL_TABLET | ORAL | Status: DC

## 2015-09-21 MED ORDER — ALBUTEROL SULFATE HFA 108 (90 BASE) MCG/ACT IN AERS: 2.0000 | INHALATION_SPRAY | RESPIRATORY_TRACT | Status: DC | PRN

## 2015-09-21 MED ORDER — AZITHROMYCIN 250 MG PO TABS: ORAL_TABLET | ORAL | Status: AC

## 2015-09-21 NOTE — Patient Instructions (Signed)
Get on samples of nexium given and continue the zantac Stay on zyrtec but also get on benadryl at night zpak Small dose of prednisone Tramadol for as needed cough  Cough, Adult Coughing is a reflex that clears your throat and your airways. Coughing helps to heal and protect your lungs. It is normal to cough occasionally, but a cough that happens with other symptoms or lasts a long time may be a sign of a condition that needs treatment. A cough may last only 2-3 weeks (acute), or it may last longer than 8 weeks (chronic). CAUSES Coughing is commonly caused by:  Breathing in substances that irritate your lungs.  A viral or bacterial respiratory infection.  Allergies.  Asthma.  Postnasal drip.  Smoking.  Acid backing up from the stomach into the esophagus (gastroesophageal reflux).  Certain medicines.  Chronic lung problems, including COPD (or rarely, lung cancer).  Other medical conditions such as heart failure. HOME CARE INSTRUCTIONS  Pay attention to any changes in your symptoms. Take these actions to help with your discomfort:  Take medicines only as told by your health care provider.  If you were prescribed an antibiotic medicine, take it as told by your health care provider. Do not stop taking the antibiotic even if you start to feel better.  Talk with your health care provider before you take a cough suppressant medicine.  Drink enough fluid to keep your urine clear or pale yellow.  If the air is dry, use a cold steam vaporizer or humidifier in your bedroom or your home to help loosen secretions.  Avoid anything that causes you to cough at work or at home.  If your cough is worse at night, try sleeping in a semi-upright position.  Avoid cigarette smoke. If you smoke, quit smoking. If you need help quitting, ask your health care provider.  Avoid caffeine.  Avoid alcohol.  Rest as needed. SEEK MEDICAL CARE IF:   You have new symptoms.  You cough up  pus.  Your cough does not get better after 2-3 weeks, or your cough gets worse.  You cannot control your cough with suppressant medicines and you are losing sleep.  You develop pain that is getting worse or pain that is not controlled with pain medicines.  You have a fever.  You have unexplained weight loss.  You have night sweats. SEEK IMMEDIATE MEDICAL CARE IF:  You cough up blood.  You have difficulty breathing.  Your heartbeat is very fast.   This information is not intended to replace advice given to you by your health care provider. Make sure you discuss any questions you have with your health care provider.   Document Released: 09/30/2010 Document Revised: 12/23/2014 Document Reviewed: 06/10/2014 Elsevier Interactive Patient Education 2016 [employer].  Common causes of cough OR hoarseness OR sore throat:   Allergies, Viral Infections, Acid Reflux and Bacterial Infections.  1) Allergies and viral infections cause a cough OR sore throat by post nasal drip and are often worse at night, can also have sneezing, lower grade fevers, clear/yellow mucus. This is best treated with allergy medications or nasal sprays.  Please get on allegra for 1-2 weeks The strongest is allegra or fexafinadine  Cheapest at [employer], [employer], [employer]  2) Bacterial infections are more severe than allergies or viral infections with fever, teeth pain, fatigue. This can be treated with prednisone and the same over the counter medication and after 7 days can be treated with an antibiotic.   3) Silent  reflux/GERD can cause a cough OR sore throat OR hoarseness WITHOUT heart burn because the esophagus that goes to the stomach and trachea that goes to the lungs are very close and when you lay down the acid can irritate your throat and lungs. This can cause hoarseness, cough, and wheezing. Please stop any alcohol or anti-inflammatories like aleve/advil/ibuprofen and start an over the counter Prilosec or  omeprazole 1-2 times daily 49mins before food for 2 weeks, then switch to over the counter zantac/ratinidine or pepcid/famotadine once at night for 2 weeks.   4) sometimes irritation causes more irritation. Try voice rest, use sugar free cough drops to prevent coughing, and try to stop clearing your throat.   If you ever have a cough that does not go away after trying these things please make a follow up visit for further evaluation or we can refer you to a specialist. Or if you ever have shortness of breath or chest pain go to the ER.

## 2015-09-21 NOTE — Progress Notes (Signed)
Subjective:    Patient ID: Jillian Rodgers, female    DOB: 1946-02-11, 70 y.o.   MRN: [id number]  HPI 70 y.o. WF with history of GERD, HTN, upper airway cough syndrome, has seen Dr. Melvyn Novas in the past, presents with cough x 1 week. She has had chest discomfort with cough, cough occ productive with green to creamy color. Going to [employer] on Friday. Has been taking tessalon that is helping some. She is just on zantac, not on PPI. On zyrtec at night.  She had CKD stage 3She is off the lasix and has been drinking more water.   Blood pressure 114/66, pulse 66, temperature 98 F (36.7 C), temperature source Temporal, resp. rate 18, height 5' 3.75" (1.619 m), weight 154 lb (69.854 kg), SpO2 93 %.  Past Medical History  Diagnosis Date  . Hypertension   . Pre-diabetes   . Degenerative joint disease   . GERD (gastroesophageal reflux disease)   . Hyperlipidemia   . Thyroid disease   . Migraines     neurontin helps  . Diabetes mellitus without complication [facility])    Current Outpatient Prescriptions on File Prior to Visit  Medication Sig Dispense Refill  . Ascorbic Acid (VITAMIN C) 500 MG CAPS Take 1 capsule by mouth daily.    [name] Kitchen aspirin 81 MG tablet Take 81 mg by mouth daily.    . benzonatate (TESSALON) 100 MG capsule TAKE 2 CAPSULES 3 TIMES DAILY AS NEEDED FOR COUGH. MAXIMUM OF 6 CAPS DAILY. 60 capsule 0  . BOTOX 100 units SOLR injection     . Cholecalciferol (VITAMIN D PO) Take 5,000 Units by mouth daily.    [name] Kitchen erythromycin with ethanol (THERAMYCIN) 2 % external solution Apply topically daily. 60 mL 6  . gabapentin (NEURONTIN) 600 MG tablet Take 600 mg by mouth 4 (four) times daily.     [name] Kitchen levothyroxine (SYNTHROID, LEVOTHROID) 50 MCG tablet take 1 tablet by mouth every morning ON AN EMPTY STOMACH 90 tablet 1  . Magnesium 250 MG TABS Take 1 tablet by mouth daily. Reported on 08/04/2015    . OVER THE COUNTER MEDICATION Taking Tangy Tangerine tabs 2.0 # 2 tabs daily.    . pravastatin  (PRAVACHOL) 40 MG tablet take 1 tablet by mouth at bedtime FOR CHOLESTEROL. 90 tablet 4  . ranitidine (ZANTAC) 300 MG tablet take 1 tablet by mouth twice a day FOR ACID INDIGESTION AND REFLUX 180 tablet 1  . meloxicam (MOBIC) 15 MG tablet Take one daily with food for 2 weeks, can take with tylenol, can not take with aleve, iburpofen, then as needed daily for pain (Patient not taking: Reported on 09/21/2015) 30 tablet 1   No current facility-administered medications on file prior to visit.    Review of Systems  Constitutional: Positive for chills and fatigue. Negative for fever, diaphoresis, activity change, appetite change and unexpected weight change.  HENT: Positive for congestion, postnasal drip, rhinorrhea and sinus pressure. Negative for sneezing and sore throat.   Respiratory: Positive for cough, chest tightness, shortness of breath and wheezing. Negative for apnea, choking and stridor.   Cardiovascular: Negative.  Negative for chest pain, palpitations and leg swelling.  Gastrointestinal: Negative.   Genitourinary: Negative.   Musculoskeletal: Positive for myalgias.  Neurological: Negative.        Objective:   Physical Exam  Constitutional: She is oriented to person, place, and time. She appears well-developed and well-nourished.  HENT:  Head: Normocephalic and atraumatic.  Right Ear: External ear  normal.  Left Ear: External ear normal.  Nose: Nose normal.  Mouth/Throat: Oropharynx is clear and moist.  Eyes: Conjunctivae are normal. Pupils are equal, round, and reactive to light.  Neck: Normal range of motion. Neck supple.  Cardiovascular: Normal rate and regular rhythm.   Pulmonary/Chest: Effort normal. No respiratory distress. She has wheezes. She has no rales. She exhibits no tenderness.  Abdominal: Soft. Bowel sounds are normal.  Lymphadenopathy:    She has no cervical adenopathy.  Neurological: She is alert and oriented to person, place, and time.  Skin: Skin is warm and  dry.      Assessment & Plan:  1. Upper airway cough syndrome PPI, H2, tramadol, zpak, prednione, if not better may refer back to Dr. Melvyn Novas  2. Gastroesophageal reflux disease, esophagitis presence not specified Add PPI - Hepatic function panel  3. CKD Stage 3 (GFR 36 ml/min) Off lasix - CBC with Differential/Platelet - BASIC METABOLIC PANEL WITH GFR

## 2015-11-04 ENCOUNTER — Ambulatory Visit: Payer: Self-pay | Admitting: Physician Assistant

## 2015-11-18 ENCOUNTER — Ambulatory Visit: Payer: Self-pay | Admitting: Physician Assistant

## 2015-11-24 ENCOUNTER — Encounter: Payer: Self-pay | Admitting: Physician Assistant

## 2015-11-24 ENCOUNTER — Ambulatory Visit (INDEPENDENT_AMBULATORY_CARE_PROVIDER_SITE_OTHER): Payer: Medicare Other | Admitting: Physician Assistant

## 2015-11-24 VITALS — BP 118/68 | HR 75 | Temp 97.6°F | Resp 16 | Ht 63.75 in | Wt 158.4 lb

## 2015-11-24 DIAGNOSIS — Z79899 Other long term (current) drug therapy: Secondary | ICD-10-CM

## 2015-11-24 DIAGNOSIS — N183 Chronic kidney disease, stage 3 unspecified: Secondary | ICD-10-CM

## 2015-11-24 DIAGNOSIS — E785 Hyperlipidemia, unspecified: Secondary | ICD-10-CM | POA: Diagnosis not present

## 2015-11-24 DIAGNOSIS — I10 Essential (primary) hypertension: Secondary | ICD-10-CM | POA: Diagnosis not present

## 2015-11-24 DIAGNOSIS — R7303 Prediabetes: Secondary | ICD-10-CM

## 2015-11-24 DIAGNOSIS — E559 Vitamin D deficiency, unspecified: Secondary | ICD-10-CM | POA: Diagnosis not present

## 2015-11-24 DIAGNOSIS — E039 Hypothyroidism, unspecified: Secondary | ICD-10-CM

## 2015-11-24 LAB — CBC WITH DIFFERENTIAL/PLATELET
Basophils Absolute: 0 cells/uL (ref 0–200)
Basophils Relative: 0 %
Eosinophils Absolute: 53 cells/uL (ref 15–500)
Eosinophils Relative: 1 %
HCT: 37.9 % (ref 35.0–45.0)
Hemoglobin: 12.3 g/dL (ref 11.7–15.5)
Lymphocytes Relative: 27 %
Lymphs Abs: 1431 cells/uL (ref 850–3900)
MCH: 29.2 pg (ref 27.0–33.0)
MCHC: 32.5 g/dL (ref 32.0–36.0)
MCV: 90 fL (ref 80.0–100.0)
MPV: 10.5 fL (ref 7.5–12.5)
Monocytes Absolute: 530 cells/uL (ref 200–950)
Monocytes Relative: 10 %
Neutro Abs: 3286 cells/uL (ref 1500–7800)
Neutrophils Relative %: 62 %
Platelets: 93 10*3/uL — ABNORMAL LOW (ref 140–400)
RBC: 4.21 MIL/uL (ref 3.80–5.10)
RDW: 13.3 % (ref 11.0–15.0)
WBC: 5.3 10*3/uL (ref 3.8–10.8)

## 2015-11-24 LAB — TSH: TSH: 1.31 mIU/L

## 2015-11-24 NOTE — Patient Instructions (Signed)
Okay I will send in lamisil for you to take. You can only get it from [facility] or [employer] will not cover it.The lamisil is taken once a day for3  months and then if can be taken for several months after for a month on it and month off it. It gets processed through you liver so we need to check your liver function at 6 weeks after taking the drug. It can take up to 6 months to a year for your toenails to get better.

## 2015-11-24 NOTE — Progress Notes (Signed)
Assessment and Plan:  Hypertension: Continue medication, monitor blood pressure at home. Continue DASH diet.  Reminder to go to the ER if any CP, SOB, nausea, dizziness, severe HA, changes vision/speech, left arm numbness and tingling, and jaw pain. Cholesterol: Continue diet and exercise. Check cholesterol.  Pre-diabetes-Continue diet and exercise. Check A1C Vitamin D Def- check level and continue medications.  CKD- Increase fluids, avoid NSAIDS, monitor sugars, will monitor Hypothyroidism-check TSH level, continue medications the same, reminded to take on an empty stomach 30-61mins before food.  Onychomycosis- has tried OTC meds, on lamisil but still with viral load, will remove toenails.   Continue diet and meds as discussed. Further disposition pending results of labs.  HPI 70 y.o. female  presents for 3 month follow up with hypertension, hyperlipidemia, prediabetes and vitamin D. 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 She denies chest pain, shortness of breath, dizziness.  She is on cholesterol medication, pravastatin 40 and denies myalgias. Her cholesterol is at goal. The cholesterol last visit was:   Lab Results  Component Value Date   CHOL 167 08/04/2015   HDL 60 08/04/2015   LDLCALC 82 08/04/2015   TRIG 126 08/04/2015   CHOLHDL 2.8 08/04/2015  She has been working on diet and exercise for prediabetes, and denies paresthesia of the feet, polydipsia, polyuria and visual disturbances. Last A1C in the office was:  Lab Results  Component Value Date   HGBA1C 5.5 08/04/2015  Patient is on Vitamin D supplement., this was decreased last time, she is on 5000 IU 5 days a week and off 2 days a week.  Lab Results  Component Value Date   VD25OH 78 08/04/2015  She is on thyroid medication. Her medication was not changed last visit.  Lab Results   Component Value Date   TSH 2.11 08/04/2015  .  Current Medications:  Current Outpatient Prescriptions on File Prior to Visit  Medication Sig Dispense Refill  . albuterol (VENTOLIN HFA) 108 (90 Base) MCG/ACT inhaler Inhale 2 puffs into the lungs every 4 (four) hours as needed for wheezing or shortness of breath. 1 Inhaler 0  . Ascorbic Acid (VITAMIN C) 500 MG CAPS Take 1 capsule by mouth daily.    [name] Kitchen aspirin 81 MG tablet Take 81 mg by mouth daily.    . benzonatate (TESSALON) 100 MG capsule TAKE 2 CAPSULES 3 TIMES DAILY AS NEEDED FOR COUGH. MAXIMUM OF 6 CAPS DAILY. 60 capsule 0  . BOTOX 100 units SOLR injection     . Cholecalciferol (VITAMIN D PO) Take 5,000 Units by mouth daily.    [name] Kitchen erythromycin with ethanol (THERAMYCIN) 2 % external solution Apply topically daily. 60 mL 6  . gabapentin (NEURONTIN) 600 MG tablet Take 600 mg by mouth 4 (four) times daily.     [name] Kitchen itraconazole (SPORANOX) 100 MG capsule Take 100 mg by mouth 2 (two) times daily.    [name] Kitchen levothyroxine (SYNTHROID, LEVOTHROID) 50 MCG tablet take 1 tablet by mouth every morning ON AN EMPTY STOMACH 90 tablet 1  . Magnesium 250 MG TABS Take 1 tablet by mouth daily. Reported on 08/04/2015    . meloxicam (MOBIC) 15 MG tablet Take one daily with food for 2 weeks, can take with tylenol, can not take with aleve, iburpofen, then as needed daily for pain (Patient not taking:  Reported on 09/21/2015) 30 tablet 1  . OVER THE COUNTER MEDICATION Taking Tangy Tangerine tabs 2.0 # 2 tabs daily.    . pravastatin (PRAVACHOL) 40 MG tablet take 1 tablet by mouth at bedtime FOR CHOLESTEROL. 90 tablet 4  . predniSONE (DELTASONE) 20 MG tablet 2 tablets daily for 3 days, 1 tablet daily for 4 days. 10 tablet 0  . ranitidine (ZANTAC) 300 MG tablet take 1 tablet by mouth twice a day FOR ACID INDIGESTION AND REFLUX 180 tablet 1  . traMADol (ULTRAM) 50 MG tablet 1 pill twice daily as needed for pain 60 tablet 0   No current facility-administered medications on file  prior to visit.    Medical History:  Past Medical History:  Diagnosis Date  . Degenerative joint disease   . Diabetes mellitus without complication ([place])   . GERD (gastroesophageal reflux disease)   . Hyperlipidemia   . Hypertension   . Migraines    neurontin helps  . Pre-diabetes   . Thyroid disease    Allergies: No Known Allergies   Review of Systems:  Review of Systems  Constitutional: Negative.   HENT: Negative.   Respiratory: Negative.   Cardiovascular: Negative.   Gastrointestinal: Positive for heartburn. Negative for abdominal pain, blood in stool, constipation, diarrhea, melena, nausea and vomiting.  Genitourinary: Negative.   Musculoskeletal: Positive for joint pain (knees and wrist, seeing ortho, doing better).  Skin: Negative.   Neurological: Negative for dizziness, tingling, tremors, sensory change, speech change, focal weakness, seizures and loss of consciousness.  Psychiatric/Behavioral: Negative.      Family history- Review and unchanged Social history- Review and unchanged Physical Exam: There were no vitals taken for this visit. Wt Readings from Last 3 Encounters:  09/21/15 154 lb (69.9 kg)  09/02/15 155 lb 9.6 oz (70.6 kg)  08/04/15 156 lb (70.8 kg)   General Appearance: Well nourished, in no apparent distress. Eyes: PERRLA, EOMs, conjunctiva no swelling or erythema Sinuses: No Frontal/maxillary tenderness ENT/Mouth: Ext aud canals clear, TMs without erythema, bulging. No erythema, swelling, or exudate on post pharynx.  Tonsils not swollen or erythematous. Hearing normal.  Neck: Supple, thyroid normal.  Respiratory: Respiratory effort normal, BS equal bilaterally without rales, rhonchi, wheezing or stridor.  Cardio: RRR with no MRGs. Brisk peripheral pulses without edema.  Abdomen: Soft, + BS.  Non tender, no guarding, rebound, hernias, masses. Lymphatics: Non tender without lymphadenopathy.  Musculoskeletal: Full ROM, 5/5 strength, normal gait.   Skin: Warm, dry without rashes, lesions, ecchymosis.  Neuro: Cranial nerves intact. Normal muscle tone, no cerebellar symptoms. Sensation intact.  Psych: Awake and oriented X 3, normal affect, Insight and Judgment appropriate.    Vicie Mutters, PA-C 10:34 AM [facility]

## 2015-11-25 ENCOUNTER — Encounter: Payer: Self-pay | Admitting: Physician Assistant

## 2015-11-25 DIAGNOSIS — D696 Thrombocytopenia, unspecified: Secondary | ICD-10-CM | POA: Insufficient documentation

## 2015-11-25 DIAGNOSIS — D693 Immune thrombocytopenic purpura: Secondary | ICD-10-CM | POA: Insufficient documentation

## 2015-11-25 LAB — HEPATIC FUNCTION PANEL
ALT: 14 U/L (ref 6–29)
AST: 20 U/L (ref 10–35)
Albumin: 4.2 g/dL (ref 3.6–5.1)
Alkaline Phosphatase: 81 U/L (ref 33–130)
Bilirubin, Direct: 0.1 mg/dL (ref <=0.2)
Indirect Bilirubin: 0.6 mg/dL (ref 0.2–1.2)
Total Bilirubin: 0.7 mg/dL (ref 0.2–1.2)
Total Protein: 6.2 g/dL (ref 6.1–8.1)

## 2015-11-25 LAB — LIPID PANEL
Cholesterol: 151 mg/dL (ref 125–200)
HDL: 81 mg/dL (ref >=46)
LDL Cholesterol: 49 mg/dL (ref <130)
Total CHOL/HDL Ratio: 1.9 Ratio (ref <=5.0)
Triglycerides: 106 mg/dL (ref <150)
VLDL: 21 mg/dL (ref <30)

## 2015-11-25 LAB — BASIC METABOLIC PANEL WITH GFR
BUN: 27 mg/dL — ABNORMAL HIGH (ref 7–25)
CO2: 26 mmol/L (ref 20–31)
Calcium: 9.8 mg/dL (ref 8.6–10.4)
Chloride: 106 mmol/L (ref 98–110)
Creat: 1.11 mg/dL — ABNORMAL HIGH (ref 0.60–0.93)
GFR, Est African American: 58 mL/min — ABNORMAL LOW (ref >=60)
GFR, Est Non African American: 50 mL/min — ABNORMAL LOW (ref >=60)
Glucose, Bld: 68 mg/dL (ref 65–99)
Potassium: 4.3 mmol/L (ref 3.5–5.3)
Sodium: 144 mmol/L (ref 135–146)

## 2015-11-25 LAB — MAGNESIUM: Magnesium: 2.2 mg/dL (ref 1.5–2.5)

## 2015-12-30 ENCOUNTER — Encounter: Payer: Self-pay | Admitting: Internal Medicine

## 2015-12-30 ENCOUNTER — Ambulatory Visit (INDEPENDENT_AMBULATORY_CARE_PROVIDER_SITE_OTHER): Payer: Medicare Other | Admitting: Internal Medicine

## 2015-12-30 VITALS — BP 112/66 | HR 72 | Temp 97.6°F | Resp 16 | Ht 63.75 in

## 2015-12-30 DIAGNOSIS — J209 Acute bronchitis, unspecified: Secondary | ICD-10-CM

## 2015-12-30 MED ORDER — PREDNISONE 20 MG PO TABS
ORAL_TABLET | ORAL | 0 refills | Status: DC
Start: 2015-12-30 — End: 2016-02-01

## 2015-12-30 MED ORDER — AZITHROMYCIN 250 MG PO TABS: ORAL_TABLET | ORAL | 0 refills | Status: DC

## 2015-12-30 MED ORDER — IPRATROPIUM-ALBUTEROL 0.5-2.5 (3) MG/3ML IN SOLN
3.0000 mL | Freq: Once | RESPIRATORY_TRACT | Status: AC
  Administered 2015-12-30: 3 mL via RESPIRATORY_TRACT

## 2015-12-30 MED ORDER — FLUTICASONE PROPIONATE 50 MCG/ACT NA SUSP: 2.0000 | Freq: Every day | NASAL | 0 refills | Status: DC

## 2015-12-30 MED ORDER — PROMETHAZINE-DM 6.25-15 MG/5ML PO SYRP: ORAL_SOLUTION | ORAL | 1 refills | Status: DC

## 2015-12-30 NOTE — Patient Instructions (Signed)
Please take the zpak as prescribed until it is gone.  Please take the prednisone as prescribed until it is gone.  Please take a daily antihistamine like claritin, zyrtec or allegra.  Please use the flonase 2 sprays per nostril at bedtime until this completely clears.  Please use the cough syrup up to 3 times per day as needed for coughing.  Please take tylenol as needed for body aches.  Please use your albuterol inhaler as needed for shortness of breath or severe coughing.  Please use 50 mg of benadryl at nighttime if you wish to get some extra drying power.

## 2015-12-30 NOTE — Progress Notes (Signed)
Patient ID: Jillian Rodgers, female   DOB: 07-02-1945, 70 y.o.   MRN: [id number]  HPI  Patient presents to the office for evaluation of cough.  It has been going on for 6 days.  Patient reports night > day, wet, barky, worse with lying down, minimal green sputum production.  They also endorse change in voice, chills, fever, night sweats, postnasal drip, sputum production and no blood with coughing.  Does admit to nasal congestion, right ear pain, rhinorrhea, headaches, fatigue, and feeling weak.    They have tried none.  They report that nothing has worked.  They denies other sick contacts.  Review of Systems  Constitutional: Positive for chills, fever and malaise/fatigue.  HENT: Positive for congestion, ear pain, hearing loss and sore throat.   Respiratory: Positive for cough. Negative for sputum production, shortness of breath and wheezing.   Cardiovascular: Negative for chest pain, palpitations and leg swelling.  Neurological: Positive for headaches.    PE: Vitals:   12/30/15 1537  BP: 112/66  Pulse: 72  Resp: 16  Temp: 97.6 F (36.4 C)   General:  Alert and non-toxic, WDWN, NAD HEENT: NCAT, PERLA, EOM normal, no occular discharge or erythema.  Nasal mucosal edema with sinus tenderness to palpation.  Oropharynx clear with minimal oropharyngeal edema and erythema.  Mucous membranes moist and pink. Neck:  Cervical adenopathy Chest:  RRR no MRGs.  Lungs clear to auscultation A&P with no wheezes rhonchi or rales.   Abdomen: +BS x 4 quadrants, soft, non-tender, no guarding, rigidity, or rebound. Skin: warm and dry no rash Neuro: A&Ox4, CN II-XII grossly intact  Assessment and Plan:   1. Acute bronchitis, unspecified organism -albuterol every 6 hours as needed for coughing -antihistamine -vaseline or saline gel in nares - azithromycin (ZITHROMAX Z-PAK) 250 MG tablet; 2 po day one, then 1 daily x 4 days  Dispense: 6 tablet; Refill: 0 - predniSONE (DELTASONE) 20 MG tablet;  3 tabs po daily x 3 days, then 2 tabs x 3 days, then 1.5 tabs x 3 days, then 1 tab x 3 days, then 0.5 tabs x 3 days  Dispense: 27 tablet; Refill: 0 - fluticasone (FLONASE) 50 MCG/ACT nasal spray; Place 2 sprays into both nostrils daily.  Dispense: 16 g; Refill: 0 - promethazine-dextromethorphan (PROMETHAZINE-DM) 6.25-15 MG/5ML syrup; Take 5-10 mL PO q8hrs prn for cough  Dispense: 180 mL; Refill: 1 - ipratropium-albuterol (DUONEB) 0.5-2.5 (3) MG/3ML nebulizer solution 3 mL; Take 3 mLs by nebulization once.

## 2016-01-01 ENCOUNTER — Other Ambulatory Visit: Payer: Self-pay | Admitting: Internal Medicine

## 2016-02-01 ENCOUNTER — Ambulatory Visit (INDEPENDENT_AMBULATORY_CARE_PROVIDER_SITE_OTHER): Payer: Medicare Other | Admitting: Physician Assistant

## 2016-02-01 ENCOUNTER — Encounter: Payer: Self-pay | Admitting: Physician Assistant

## 2016-02-01 VITALS — BP 118/74 | HR 73 | Temp 97.3°F | Resp 16 | Ht 62.75 in | Wt 160.6 lb

## 2016-02-01 DIAGNOSIS — Z23 Encounter for immunization: Secondary | ICD-10-CM

## 2016-02-01 DIAGNOSIS — L600 Ingrowing nail: Secondary | ICD-10-CM | POA: Diagnosis not present

## 2016-02-01 DIAGNOSIS — B351 Tinea unguium: Secondary | ICD-10-CM | POA: Diagnosis not present

## 2016-02-01 MED ORDER — TERBINAFINE HCL 250 MG PO TABS: 250.0000 mg | ORAL_TABLET | Freq: Every day | ORAL | 0 refills | Status: DC

## 2016-02-01 NOTE — Patient Instructions (Signed)
Fingernail or Toenail Removal, Care After Refer to this sheet in the next few weeks. These instructions provide you with information about caring for yourself after your procedure. Your health care provider may also give you more specific instructions. Your treatment has been planned according to current medical practices, but problems sometimes occur. Call your health care provider if you have any problems or questions after your procedure. WHAT TO EXPECT AFTER THE PROCEDURE After your procedure, it is common to have:  Redness.  Swelling. HOME CARE INSTRUCTIONS  If you have a splint on your finger:  Wear it as directed by your health care provider. Remove it only as directed by your health care provider.  Loosen the splint if your fingers become numb and tingle, or if they turn cold and blue.  If you were given a surgical shoe, wear it as directed by your health care provider.  Take medicines only as directed by your health care provider.  Elevate your hand or foot as much of the time as possible. This helps with pain and swelling.  If you are recovering from fingernail removal, keep your hand raised above the level of your heart.  If you are recovering from toenail removal, lie on a bed or a couch with your leg propped up on pillows, or sit in a reclining chair with the footrest up.  Follow instructions from your health care provider about bandage (dressing) changes and removal:  Change your dressing 24 hours after your procedure or as directed by your health care provider.  Soak your hand or foot in warm, soapy water for 10-20 minutes or as directed by your health care provider. Do this 3 times per day or as directed by your health care provider. This reduces pain and swelling.  After you soak your hand or foot, apply a clean, dry dressing.  Keep your dressing clean and dry. Change your dressing whenever it gets wet or dirty.  Keep all follow-up visits as directed by your  health care provider. This is important. SEEK MEDICAL CARE IF:  You have increased redness or pain at your nail area.  You have increased fluid, blood, or pus coming from your nail area.  There is a bad smell coming from the dressing.  You have a fever.  Your swelling gets worse, or you have swelling that spreads from your finger to your hand or from your toe to your foot.  You have worsening redness that spreads from your finger to your hand or from your toe up to your foot.  Your finger or toe looks blue or black.   This information is not intended to replace advice given to you by your health care provider. Make sure you discuss any questions you have with your health care provider.   Document Released: 04/24/2014 Document Reviewed: 04/24/2014 Elsevier Interactive Patient Education [employer].

## 2016-02-01 NOTE — Progress Notes (Signed)
H&P 70 y.o. female presents to the office with onychomycosis of toenails, she has tried lamisil without success for over 6 months. Denies fever, chills.   Medications: Current Outpatient Prescriptions on File Prior to Visit  Medication Sig Dispense Refill  . albuterol (VENTOLIN HFA) 108 (90 Base) MCG/ACT inhaler Inhale 2 puffs into the lungs every 4 (four) hours as needed for wheezing or shortness of breath. 1 Inhaler 0  . Ascorbic Acid (VITAMIN C) 500 MG CAPS Take 1 capsule by mouth daily.    [name] Kitchen aspirin 81 MG tablet Take 81 mg by mouth daily.    [name] Kitchen BOTOX 100 units SOLR injection     . Cholecalciferol (VITAMIN D PO) Take 5,000 Units by mouth daily.    [name] Kitchen erythromycin with ethanol (THERAMYCIN) 2 % external solution Apply topically daily. 60 mL 6  . fluticasone (FLONASE) 50 MCG/ACT nasal spray Place 2 sprays into both nostrils daily. 16 g 0  . gabapentin (NEURONTIN) 600 MG tablet Take 600 mg by mouth 4 (four) times daily.     [name] Kitchen itraconazole (SPORANOX) 100 MG capsule Take 100 mg by mouth 2 (two) times daily.    [name] Kitchen levothyroxine (SYNTHROID, LEVOTHROID) 50 MCG tablet TAKE 1 TABLET BY MOUTH EVERY MORNING ON AN EMPTY STOMACH 90 tablet 0  . OVER THE COUNTER MEDICATION Taking Tangy Tangerine tabs 2.0 # 2 tabs daily.    . pravastatin (PRAVACHOL) 40 MG tablet take 1 tablet by mouth at bedtime FOR CHOLESTEROL. 90 tablet 4  . ranitidine (ZANTAC) 300 MG tablet take 1 tablet by mouth twice a day FOR ACID INDIGESTION AND REFLUX 180 tablet 1   No current facility-administered medications on file prior to visit.     Problem list Patient Active Problem List   Diagnosis Date Noted  . Thrombocytopenia ([place]) 11/25/2015  . Encounter for Medicare annual wellness exam 06/14/2015  . BMI 26.71,  adult 01/28/2015  . CKD Stage 3 (GFR 36 ml/min) 01/19/2014  . Upper airway cough syndrome 12/16/2013  . Vitamin D deficiency 05/21/2013  . Prediabetes 05/21/2013  . Medication management 05/21/2013  . Essential  hypertension 01/31/2013  . Degenerative joint disease   . GERD (gastroesophageal reflux disease)   . Hyperlipidemia   . Hypothyroidism     Physical Exam Blood pressure 118/74, pulse 73, temperature 97.3 F (36.3 C), resp. rate 16, height 5' 2.75" (1.594 m), weight 160 lb 9.6 oz (72.8 kg), SpO2 95 %. alert and no distress hammer toes, overlapping toes, nail exam onychomycosis of the toenails and dystrophic nails and foot and ankle exam otherwise normal  PROCEDURE NOTE: Ingrown Toenail Removal  Verbal Consent Obtained. wiped with alcohol prep pad, then digital block with 6cc of 1% lidocaine  bilateral medial nail lifted and excised, medial aspect of nail left intact.  Proximal aspect of nail bed explored revealing no nail remnants. Ingrown tissue debrided. No active bleeding except left great toe had to be redressed and pressure held until bleeding slowed Dressing applied. Cleansed and dressed. Elevated foot  Assessment and Plan: Ingrown toenail Wound care instructions including precautions reviewed with patient. DX: [id number] complete nail removal ingrown toenail Lamisil Revisit 6 weeks Come back sooner if any questions or need a redressing

## 2016-02-02 ENCOUNTER — Other Ambulatory Visit: Payer: Self-pay | Admitting: Obstetrics and Gynecology

## 2016-02-02 DIAGNOSIS — E2839 Other primary ovarian failure: Secondary | ICD-10-CM

## 2016-02-04 ENCOUNTER — Ambulatory Visit (INDEPENDENT_AMBULATORY_CARE_PROVIDER_SITE_OTHER): Payer: Medicare Other | Admitting: Physician Assistant

## 2016-02-04 ENCOUNTER — Other Ambulatory Visit: Payer: Self-pay | Admitting: Internal Medicine

## 2016-02-04 ENCOUNTER — Encounter: Payer: Self-pay | Admitting: Physician Assistant

## 2016-02-04 VITALS — BP 118/66 | HR 68 | Temp 97.7°F | Resp 16 | Wt 161.2 lb

## 2016-02-04 DIAGNOSIS — L600 Ingrowing nail: Secondary | ICD-10-CM | POA: Diagnosis not present

## 2016-02-04 DIAGNOSIS — B351 Tinea unguium: Secondary | ICD-10-CM | POA: Diagnosis not present

## 2016-02-04 NOTE — Progress Notes (Signed)
Assessment and Plan: Onychomycosis with ingrown toenail Start lamisil Start to use onstick gauze Continue to elevate tylenol PRN Wash and pat dry  HPI 70 y.o.female presents for 3 day follow up for bilateral toe nail removal due to fungus and ingrown toenails, she is on lamisil. She states that she has been having bilateral toenail pain.   Past Medical History:  Diagnosis Date  . Degenerative joint disease   . Diabetes mellitus without complication ([place])   . GERD (gastroesophageal reflux disease)   . Hyperlipidemia   . Hypertension   . Migraines    neurontin helps  . Pre-diabetes   . Thyroid disease      No Known Allergies    Current Outpatient Prescriptions on File Prior to Visit  Medication Sig Dispense Refill  . albuterol (VENTOLIN HFA) 108 (90 Base) MCG/ACT inhaler Inhale 2 puffs into the lungs every 4 (four) hours as needed for wheezing or shortness of breath. 1 Inhaler 0  . Ascorbic Acid (VITAMIN C) 500 MG CAPS Take 1 capsule by mouth daily.    [name] Kitchen aspirin 81 MG tablet Take 81 mg by mouth daily.    [name] Kitchen BOTOX 100 units SOLR injection     . Cholecalciferol (VITAMIN D PO) Take 5,000 Units by mouth daily.    [name] Kitchen erythromycin with ethanol (THERAMYCIN) 2 % external solution Apply topically daily. 60 mL 6  . fluticasone (FLONASE) 50 MCG/ACT nasal spray Place 2 sprays into both nostrils daily. 16 g 0  . gabapentin (NEURONTIN) 600 MG tablet Take 600 mg by mouth 4 (four) times daily.     [name] Kitchen itraconazole (SPORANOX) 100 MG capsule Take 100 mg by mouth 2 (two) times daily.    [name] Kitchen levothyroxine (SYNTHROID, LEVOTHROID) 50 MCG tablet TAKE 1 TABLET BY MOUTH EVERY MORNING ON AN EMPTY STOMACH 90 tablet 0  . OVER THE COUNTER MEDICATION Taking Tangy Tangerine tabs 2.0 # 2 tabs daily.    . pravastatin (PRAVACHOL) 40 MG tablet take 1 tablet by mouth at bedtime FOR CHOLESTEROL. 90 tablet 4  . terbinafine (LAMISIL) 250 MG tablet Take 1 tablet (250 mg total) by mouth daily. Take one daily for 3 months,  need liver function lab at 6 weeks. 90 tablet 0   No current facility-administered medications on file prior to visit.     ROS: all negative except above.   Physical Exam: Filed Weights   02/04/16 1119  Weight: 161 lb 3.2 oz (73.1 kg)   BP 118/66   Pulse 68   Temp 97.7 F (36.5 C)   Resp 16   Wt 161 lb 3.2 oz (73.1 kg)   SpO2 96%   BMI 28.78 kg/m  Feet: well healing bilateral toenail removals, dried blood, + tenderness to palpation, no warmth, discharge, swelling, redness, good cap refill and sensation.     Vicie Mutters, PA-C 11:23 AM [facility]

## 2016-02-08 ENCOUNTER — Telehealth: Payer: Self-pay

## 2016-02-08 NOTE — Telephone Encounter (Signed)
Informed pt to cont. Tylenol  Elevate feet Wash and keep feet dry Can make an office visit if need be. Pt states that when she changes the wrapping if they look bad she will come into the office.

## 2016-02-17 ENCOUNTER — Ambulatory Visit (INDEPENDENT_AMBULATORY_CARE_PROVIDER_SITE_OTHER): Payer: Medicare Other | Admitting: Physician Assistant

## 2016-02-17 ENCOUNTER — Encounter: Payer: Self-pay | Admitting: Physician Assistant

## 2016-02-17 VITALS — BP 126/80 | HR 74 | Temp 97.9°F | Resp 16 | Ht 62.75 in | Wt 157.0 lb

## 2016-02-17 DIAGNOSIS — L600 Ingrowing nail: Secondary | ICD-10-CM

## 2016-02-17 DIAGNOSIS — R059 Cough, unspecified: Secondary | ICD-10-CM

## 2016-02-17 DIAGNOSIS — R05 Cough: Secondary | ICD-10-CM

## 2016-02-17 DIAGNOSIS — B351 Tinea unguium: Secondary | ICD-10-CM | POA: Diagnosis not present

## 2016-02-17 MED ORDER — DEXAMETHASONE SODIUM PHOSPHATE 100 MG/10ML IJ SOLN: 10.0000 mg | Freq: Once | INTRAMUSCULAR | Status: DC

## 2016-02-17 MED ORDER — BENZONATATE 100 MG PO CAPS: 200.0000 mg | ORAL_CAPSULE | Freq: Three times a day (TID) | ORAL | 0 refills | Status: DC | PRN

## 2016-02-17 NOTE — Progress Notes (Signed)
Subjective:    Patient ID: Jillian Rodgers, female    DOB: 11-13-1945, 70 y.o.   MRN: [id number]  HPI 70 y.o. WF presents with sore throat, cough, runny nose, light headed, nonproductive cough x 6 days. Has toenails removed due to irritation and fungus, no discharge, some tenderness to palpation still.   Blood pressure 126/80, pulse 74, temperature 97.9 F (36.6 C), resp. rate 16, height 5' 2.75" (1.594 m), weight 157 lb (71.2 kg), SpO2 98 %.  Medications Current Outpatient Prescriptions on File Prior to Visit  Medication Sig  . Ascorbic Acid (VITAMIN C) 500 MG CAPS Take 1 capsule by mouth daily.  [name] Kitchen aspirin 81 MG tablet Take 81 mg by mouth daily.  [name] Kitchen BOTOX 100 units SOLR injection   . cetirizine (ZYRTEC) 10 MG tablet Take 10 mg by mouth daily.  . Cholecalciferol (VITAMIN D PO) Take 5,000 Units by mouth daily.  [name] Kitchen erythromycin with ethanol (THERAMYCIN) 2 % external solution Apply topically daily.  [name] Kitchen gabapentin (NEURONTIN) 600 MG tablet Take 600 mg by mouth 4 (four) times daily.   [name] Kitchen levothyroxine (SYNTHROID, LEVOTHROID) 50 MCG tablet TAKE 1 TABLET BY MOUTH EVERY MORNING ON AN EMPTY STOMACH  . OVER THE COUNTER MEDICATION Taking Tangy Tangerine tabs 2.0 # 2 tabs daily.  . pravastatin (PRAVACHOL) 40 MG tablet take 1 tablet by mouth at bedtime FOR CHOLESTEROL.  [name] Kitchen ranitidine (ZANTAC) 300 MG tablet TAKE ONE TABLET BY MOUTH TWICE DAILY FOR ACID INDIGESTION AND REFLUX  . terbinafine (LAMISIL) 250 MG tablet Take 1 tablet (250 mg total) by mouth daily. Take one daily for 3 months, need liver function lab at 6 weeks.   No current facility-administered medications on file prior to visit.     Problem list She has Essential hypertension; Degenerative joint disease; GERD (gastroesophageal reflux disease); Hyperlipidemia; Hypothyroidism; Vitamin D deficiency; Prediabetes; Medication management; Upper airway cough syndrome; CKD Stage 3 (GFR 36 ml/min); BMI 26.71,  adult; Encounter for Medicare  annual wellness exam; and Thrombocytopenia ([place]) on her problem list.   Review of Systems  Constitutional: Positive for fatigue. Negative for chills and fever.  HENT: Positive for congestion, rhinorrhea, sinus pressure, sore throat and voice change. Negative for dental problem, ear discharge, ear pain, nosebleeds and trouble swallowing.   Respiratory: Positive for cough and wheezing. Negative for chest tightness and shortness of breath.   Cardiovascular: Negative.   Gastrointestinal: Negative.   Genitourinary: Negative.   Musculoskeletal: Negative.   Neurological: Negative.        Objective:   Physical Exam  Constitutional: She appears well-developed and well-nourished.  HENT:  Head: Normocephalic and atraumatic.  Right Ear: External ear normal.  Nose: Right sinus exhibits maxillary sinus tenderness. Right sinus exhibits no frontal sinus tenderness. Left sinus exhibits maxillary sinus tenderness. Left sinus exhibits no frontal sinus tenderness.  Eyes: Conjunctivae and EOM are normal.  Neck: Normal range of motion. Neck supple.  Cardiovascular: Normal rate, regular rhythm, normal heart sounds and intact distal pulses.   Pulmonary/Chest: Effort normal and breath sounds normal. No respiratory distress. She has no wheezes.  Abdominal: Soft. Bowel sounds are normal.  Lymphadenopathy:    She has cervical adenopathy.  Skin: Skin is warm and dry.       Assessment & Plan:  1. Onychomycosis with ingrown toenail Healing well, continue the same.   2. Cough [place], albuterol inhaler, allergy pill, hold zpak - dexamethasone (DECADRON) injection 10 mg; Inject 1 mL (10 mg total) into the muscle once.

## 2016-02-17 NOTE — Patient Instructions (Signed)

## 2016-02-21 ENCOUNTER — Encounter: Payer: Self-pay | Admitting: Internal Medicine

## 2016-02-28 ENCOUNTER — Ambulatory Visit (INDEPENDENT_AMBULATORY_CARE_PROVIDER_SITE_OTHER): Payer: Medicare Other | Admitting: Internal Medicine

## 2016-02-28 ENCOUNTER — Encounter: Payer: Self-pay | Admitting: Internal Medicine

## 2016-02-28 VITALS — BP 126/68 | HR 71 | Temp 98.1°F | Resp 12 | Ht 64.5 in | Wt 158.8 lb

## 2016-02-28 DIAGNOSIS — I10 Essential (primary) hypertension: Secondary | ICD-10-CM | POA: Diagnosis not present

## 2016-02-28 DIAGNOSIS — D518 Other vitamin B12 deficiency anemias: Secondary | ICD-10-CM

## 2016-02-28 DIAGNOSIS — Z1212 Encounter for screening for malignant neoplasm of rectum: Secondary | ICD-10-CM

## 2016-02-28 DIAGNOSIS — E782 Mixed hyperlipidemia: Secondary | ICD-10-CM

## 2016-02-28 DIAGNOSIS — J0410 Acute tracheitis without obstruction: Secondary | ICD-10-CM

## 2016-02-28 DIAGNOSIS — E039 Hypothyroidism, unspecified: Secondary | ICD-10-CM

## 2016-02-28 DIAGNOSIS — Z136 Encounter for screening for cardiovascular disorders: Secondary | ICD-10-CM | POA: Diagnosis not present

## 2016-02-28 DIAGNOSIS — Z0001 Encounter for general adult medical examination with abnormal findings: Secondary | ICD-10-CM

## 2016-02-28 DIAGNOSIS — Z79899 Other long term (current) drug therapy: Secondary | ICD-10-CM

## 2016-02-28 DIAGNOSIS — Z0000 Encounter for general adult medical examination without abnormal findings: Secondary | ICD-10-CM

## 2016-02-28 DIAGNOSIS — K219 Gastro-esophageal reflux disease without esophagitis: Secondary | ICD-10-CM

## 2016-02-28 DIAGNOSIS — D508 Other iron deficiency anemias: Secondary | ICD-10-CM

## 2016-02-28 DIAGNOSIS — E559 Vitamin D deficiency, unspecified: Secondary | ICD-10-CM

## 2016-02-28 DIAGNOSIS — R7303 Prediabetes: Secondary | ICD-10-CM

## 2016-02-28 LAB — CBC WITH DIFFERENTIAL/PLATELET
Basophils Absolute: 0 cells/uL (ref 0–200)
Basophils Relative: 0 %
Eosinophils Absolute: 180 cells/uL (ref 15–500)
Eosinophils Relative: 2 %
HCT: 42.2 % (ref 35.0–45.0)
Hemoglobin: 13.8 g/dL (ref 11.7–15.5)
Lymphocytes Relative: 29 %
Lymphs Abs: 2610 cells/uL (ref 850–3900)
MCH: 30.1 pg (ref 27.0–33.0)
MCHC: 32.7 g/dL (ref 32.0–36.0)
MCV: 91.9 fL (ref 80.0–100.0)
MPV: 10.7 fL (ref 7.5–12.5)
Monocytes Absolute: 900 cells/uL (ref 200–950)
Monocytes Relative: 10 %
Neutro Abs: 5310 cells/uL (ref 1500–7800)
Neutrophils Relative %: 59 %
Platelets: 155 10*3/uL (ref 140–400)
RBC: 4.59 MIL/uL (ref 3.80–5.10)
RDW: 14.3 % (ref 11.0–15.0)
WBC: 9 10*3/uL (ref 3.8–10.8)

## 2016-02-28 LAB — TSH: TSH: 2.78 mIU/L

## 2016-02-28 LAB — VITAMIN B12: Vitamin B-12: 1335 pg/mL — ABNORMAL HIGH (ref 200–1100)

## 2016-02-28 LAB — HEMOGLOBIN A1C
Hgb A1c MFr Bld: 5.6 % (ref <5.7)
Mean Plasma Glucose: 114 mg/dL

## 2016-02-28 MED ORDER — PREDNISONE 20 MG PO TABS: ORAL_TABLET | ORAL | 0 refills | Status: DC

## 2016-02-28 MED ORDER — LEVOFLOXACIN 500 MG PO TABS: ORAL_TABLET | ORAL | 1 refills | Status: DC

## 2016-02-28 NOTE — Patient Instructions (Signed)

## 2016-02-28 NOTE — Progress Notes (Signed)
[facility] Unk Pinto, M.D.    Uvaldo Bristle. Silverio Lay, P.A.-C      Starlyn Skeans, P.A.-C  [facility]                [address] [place], [place] [id number] Telephone [phone] Telefax [phone]  Annual Screening/Preventative Visit & Comprehensive Evaluation &  Examination     This very nice 70 y.o. MWF presents for a Screening/Preventative Visit & comprehensive evaluation and management of multiple medical co-morbidities.  Patient has been followed for HTN, Prediabetes, Hyperlipidemia and Vitamin D Deficiency. Patient is also following with Dr Orie Rout at the Headache clinic.      Patient was treated for lower respiratory infection several weeks ago with a Z pak & pred taper and initially improved with persistence of intermittent hoarseness and cough productive of a greenish sputum.       HTN predates since  The 1990's. Patient's BP has been controlled at home and patient denies any cardiac symptoms as chest pain, palpitations, shortness of breath, dizziness or ankle swelling. Today's BP is  126/68      Patient's hyperlipidemia is controlled with diet and medications. Patient denies myalgias or other medication SE's. Last lipids were at goal: Lab Results  Component Value Date   CHOL 151 11/24/2015   HDL 81 11/24/2015   LDLCALC 49 11/24/2015   TRIG 106 11/24/2015   CHOLHDL 1.9 11/24/2015      Patient has prediabetes predating circa 2012 with A1c 6.0% and patient denies reactive hypoglycemic symptoms, visual blurring, diabetic polys, or paresthesias. Last A1c was at goal: Lab Results  Component Value Date   HGBA1C 5.5 08/04/2015      Patient has been on thyroid replacement for years.  Finally, patient has history of Vitamin D Deficiency in 2012 of "39" and last Vitamin D was at goal: Lab Results  Component Value Date   VD25OH 78 08/04/2015   Current Outpatient Prescriptions on  File Prior to Visit  Medication Sig  . VITAMIN C 500 MG  Take 1 capsule by mouth daily.  [name] Kitchen aspirin 81 MG  Take 81 mg by mouth daily.  [name] Kitchen BOTOX 100 units SOLR injection   . cetirizine  10 MG tablet Take 10 mg by mouth daily.  [name] Kitchen VITAMIN D Take 5,000 Units by mouth daily.  [name] Kitchen erythromycin with ethanol (THERAMYCIN) 2 % ext soln Apply topically daily.  [name] Kitchen gabapentin  600 MG  Take 4  times daily.   [name] Kitchen levothyroxine  50 MCG TAKE 1 TAB EVERY MORNING   Tangy Tangerine tabs 2.0 Taking  # 2 tabs daily.  . Pravastatin 40 MG  take 1 tab at bedtime FOR CHOLESTEROL.  [name] Kitchen ranitidine  300 MG  TAKE ONE TAB TWICE DAILY    No Known Allergies   Past Medical History:  Diagnosis Date  . Degenerative joint disease   . Diabetes mellitus without complication ([place])   . GERD (gastroesophageal reflux disease)   . Hyperlipidemia   . Hypertension   . Migraines    neurontin helps  . Pre-diabetes   . Thyroid disease    Health Maintenance  Topic Date Due  . Hepatitis C Screening  08-Apr-1946  . OPHTHALMOLOGY EXAM  02/15/1954  . COLONOSCOPY  02/15/1994  . FOOT EXAM  09/05/2014  . MAMMOGRAM  01/30/2015  . URINE MICROALBUMIN  01/28/2016  . HEMOGLOBIN  A1C  02/03/2016  . TETANUS/TDAP  11/11/2024  . INFLUENZA VACCINE  Completed  . DEXA SCAN  Completed  . ZOSTAVAX  Completed  . PNA vac Low Risk Adult  Completed   Immunization History  Administered Date(s) Administered  . Influenza, High Dose Seasonal PF 12/29/2013, 01/15/2015, 02/01/2016  . Influenza-Unspecified 01/24/2011, 02/02/2012  . Pneumococcal Conjugate-13 10/28/2014  . Pneumococcal-Unspecified 11/28/2011  . Td 11/17/2010  . Tdap 11/12/2014  . Zoster 11/17/2010   Past Surgical History:  Procedure Laterality Date  . CHOLECYSTECTOMY  1996   open  . COLONOSCOPY    . PILONIDAL CYST EXCISION  1972  . TONSILLECTOMY    . UPPER GI ENDOSCOPY    . VENTRAL HERNIA REPAIR     Family History  Problem Relation Age of Onset  . Ovarian cancer Mother   .  Breast cancer Sister   . Thyroid cancer Sister   . Stroke Paternal Uncle    Social History  Substance Use Topics  . Smoking status: Never Smoker  . Smokeless tobacco: Never Used  . Alcohol use No    ROS Constitutional: Denies fever, chills, weight loss/gain, headaches, insomnia,  night sweats, and change in appetite. Does c/o fatigue. Eyes: Denies redness, blurred vision, diplopia, discharge, itchy, watery eyes.  ENT: Denies discharge, congestion, post nasal drip, epistaxis, sore throat, earache, hearing loss, dental pain, Tinnitus, Vertigo, Sinus pain, snoring.  Cardio: Denies chest pain, palpitations, irregular heartbeat, syncope, dyspnea, diaphoresis, orthopnea, PND, claudication, edema Respiratory: denies cough, dyspnea, DOE, pleurisy, hoarseness, laryngitis, wheezing.  Gastrointestinal: Denies dysphagia, heartburn, reflux, water brash, pain, cramps, nausea, vomiting, bloating, diarrhea, constipation, hematemesis, melena, hematochezia, jaundice, hemorrhoids Genitourinary: Denies dysuria, frequency, urgency, nocturia, hesitancy, discharge, hematuria, flank pain Breast: Breast lumps, nipple discharge, bleeding.  Musculoskeletal: Denies arthralgia, myalgia, stiffness, Jt. Swelling, pain, limp, and strain/sprain. Denies falls. Skin: Denies puritis, rash, hives, warts, acne, eczema, changing in skin lesion Neuro: No weakness, tremor, incoordination, spasms, paresthesia, pain Psychiatric: Denies confusion, memory loss, sensory loss. Denies Depression. Endocrine: Denies change in weight, skin, hair change, nocturia, and paresthesia, diabetic polys, visual blurring, hyper / hypo glycemic episodes.  Heme/Lymph: No excessive bleeding, bruising, enlarged lymph nodes.  Physical Exam  BP 126/68   Pulse 71   Temp 98.1 F (36.7 C)   Resp 16   Ht 5' 4.5" (1.638 m)   Wt 158 lb 12.8 oz (72 kg)   SpO2 96%   BMI 26.84 kg/m   General Appearance: Well nourished and in no apparent distress. Dry  hacking tracheal cough and no stridor.  Eyes: PERRLA, EOMs, conjunctiva no swelling or erythema, normal fundi and vessels. Sinuses: No frontal/maxillary tenderness ENT/Mouth: EACs patent / TMs  nl. Nares clear without erythema, swelling, mucoid exudates. Oral hygiene is good. No erythema, swelling, or exudate. Tongue normal, non-obstructing. Tonsils not swollen or erythematous. Hearing normal.  Neck: Supple, thyroid normal. No bruits, nodes or JVD. Respiratory: Respiratory effort normal.  BS equal with few scattered rale clearing with cough.  No  rhonci, wheezing or stridor. Cardio: Heart sounds are normal with regular rate and rhythm and no murmurs, rubs or gallops. Peripheral pulses are normal and equal bilaterally without edema. No aortic or femoral bruits. Chest: symmetric with normal excursions and percussion. Breasts: Symmetric, without lumps, nipple discharge, retractions, or fibrocystic changes.  Abdomen: Flat, soft with bowel sounds active. Nontender, no guarding, rebound, hernias, masses, or organomegaly.  Lymphatics: Non tender without lymphadenopathy.  Genitourinary:  Musculoskeletal: Full ROM all peripheral extremities, joint stability, 5/5 strength,  and normal gait. Skin: Warm and dry without rashes, lesions, cyanosis, clubbing or  ecchymosis.  Neuro: Cranial nerves intact, reflexes equal bilaterally. Normal muscle tone, no cerebellar symptoms. Sensation intact.  Pysch: Alert and oriented X 3, normal affect, Insight and Judgment appropriate.   Assessment and Plan  1. Annual Preventative Screening Examination  - Microalbumin / creatinine urine ratio - EKG 12-Lead - [place], RETROPERITNL ABD,  LTD - POC Hemoccult Bld/Stl - Urinalysis, Routine w reflex microscopic  - Vitamin B12 - Iron and TIBC - CBC with Differential/Platelet - BASIC METABOLIC PANEL WITH GFR - Hepatic function panel - Magnesium - Lipid panel - TSH - Hemoglobin A1c - Insulin, random - VITAMIN D 25 Hydroxy    2. Essential hypertension  - Microalbumin / creatinine urine ratio - EKG 12-Lead - [place], RETROPERITNL ABD,  LTD - TSH  3. Mixed hyperlipidemia  - Lipid panel - TSH  4. Prediabetes  - Hemoglobin A1c - Insulin, random  5. Vitamin D deficiency  - VITAMIN D 25 Hydroxy   6. Acquired hypothyroidism  - TSH  7. Gastroesophageal reflux disease   8. Other iron deficiency anemia  - Iron and TIBC - CBC with Differential/Platelet  9. Other vitamin B12 deficiency anemia  - Vitamin B12 - CBC with Differential/Platelet  10. Screening for rectal cancer  - POC Hemoccult Bld/Stl   11. Screening for ischemic heart disease   12. Medication management  - Urinalysis, Routine w reflex microscopic  - CBC with Differential/Platelet - BASIC METABOLIC PANEL WITH GFR - Hepatic function panel - Magnesium  13. Tracheitis  - levofloxacin (LEVAQUIN) 500 MG tablet; Take 1 tablet daily with food for infection  Dispense: 15 tablet; Refill: 1 - predniSONE (DELTASONE) 20 MG tablet; 1 tab 3 x day for 3 days, then 1 tab 2 x day for 3 days, then 1 tab 1 x day for 5 days  Dispense: 20 tablet; Refill: 0       Continue prudent diet as discussed, weight control, BP monitoring, regular exercise, and medications. Discussed med's effects and SE's. Screening labs and tests as requested with regular follow-up as recommended. Over 40 minutes of exam, counseling, chart review and high complex critical decision making was performed.

## 2016-02-29 ENCOUNTER — Other Ambulatory Visit: Payer: Self-pay | Admitting: Internal Medicine

## 2016-02-29 LAB — URINALYSIS, ROUTINE W REFLEX MICROSCOPIC
Bilirubin Urine: NEGATIVE
Glucose, UA: NEGATIVE
Hgb urine dipstick: NEGATIVE
Nitrite: NEGATIVE
Specific Gravity, Urine: 1.023 (ref 1.001–1.035)
pH: 6 (ref 5.0–8.0)

## 2016-02-29 LAB — MAGNESIUM: Magnesium: 2 mg/dL (ref 1.5–2.5)

## 2016-02-29 LAB — LIPID PANEL
Cholesterol: 171 mg/dL (ref <200)
HDL: 66 mg/dL (ref >50)
LDL Cholesterol: 72 mg/dL (ref <100)
Total CHOL/HDL Ratio: 2.6 Ratio (ref <5.0)
Triglycerides: 163 mg/dL — ABNORMAL HIGH (ref <150)
VLDL: 33 mg/dL — ABNORMAL HIGH (ref <30)

## 2016-02-29 LAB — BASIC METABOLIC PANEL WITH GFR
BUN: 23 mg/dL (ref 7–25)
CO2: 23 mmol/L (ref 20–31)
Calcium: 9.8 mg/dL (ref 8.6–10.4)
Chloride: 106 mmol/L (ref 98–110)
Creat: 1.18 mg/dL — ABNORMAL HIGH (ref 0.60–0.93)
GFR, Est African American: 53 mL/min — ABNORMAL LOW (ref >=60)
GFR, Est Non African American: 46 mL/min — ABNORMAL LOW (ref >=60)
Glucose, Bld: 65 mg/dL (ref 65–99)
Potassium: 3.7 mmol/L (ref 3.5–5.3)
Sodium: 143 mmol/L (ref 135–146)

## 2016-02-29 LAB — MICROALBUMIN / CREATININE URINE RATIO
Creatinine, Urine: 231 mg/dL (ref 20–320)
Microalb Creat Ratio: 121 mcg/mg creat — ABNORMAL HIGH (ref <30)
Microalb, Ur: 28 mg/dL

## 2016-02-29 LAB — HEPATIC FUNCTION PANEL
ALT: 13 U/L (ref 6–29)
AST: 16 U/L (ref 10–35)
Albumin: 4 g/dL (ref 3.6–5.1)
Alkaline Phosphatase: 76 U/L (ref 33–130)
Bilirubin, Direct: 0.1 mg/dL (ref <=0.2)
Indirect Bilirubin: 0.5 mg/dL (ref 0.2–1.2)
Total Bilirubin: 0.6 mg/dL (ref 0.2–1.2)
Total Protein: 6.4 g/dL (ref 6.1–8.1)

## 2016-02-29 LAB — INSULIN, RANDOM: Insulin: 6.1 u[IU]/mL (ref 2.0–19.6)

## 2016-02-29 LAB — URINALYSIS, MICROSCOPIC ONLY
Casts: NONE SEEN [LPF]
Crystals: NONE SEEN [HPF]
Yeast: NONE SEEN [HPF]

## 2016-02-29 LAB — IRON AND TIBC
%SAT: 35 % (ref 11–50)
Iron: 127 ug/dL (ref 45–160)
TIBC: 362 ug/dL (ref 250–450)
UIBC: 235 ug/dL (ref 125–400)

## 2016-02-29 LAB — VITAMIN D 25 HYDROXY (VIT D DEFICIENCY, FRACTURES): Vit D, 25-Hydroxy: 84 ng/mL (ref 30–100)

## 2016-03-02 ENCOUNTER — Other Ambulatory Visit: Payer: Self-pay | Admitting: Nephrology

## 2016-03-02 ENCOUNTER — Ambulatory Visit
Admission: RE | Admit: 2016-03-02 | Discharge: 2016-03-02 | Disposition: A | Discharge status: Home / Self Care | Payer: Medicare Other | Source: Ambulatory Visit | Attending: Nephrology | Admitting: Nephrology

## 2016-03-02 DIAGNOSIS — N281 Cyst of kidney, acquired: Secondary | ICD-10-CM

## 2016-03-24 ENCOUNTER — Other Ambulatory Visit: Payer: Self-pay | Admitting: Physician Assistant

## 2016-04-16 ENCOUNTER — Other Ambulatory Visit: Payer: Self-pay | Admitting: Internal Medicine

## 2016-04-22 ENCOUNTER — Encounter: Payer: Self-pay | Admitting: *Deleted

## 2016-05-26 ENCOUNTER — Other Ambulatory Visit: Payer: Self-pay | Admitting: Internal Medicine

## 2016-05-29 ENCOUNTER — Encounter: Payer: Self-pay | Admitting: Physician Assistant

## 2016-05-29 ENCOUNTER — Ambulatory Visit (INDEPENDENT_AMBULATORY_CARE_PROVIDER_SITE_OTHER): Payer: Medicare Other | Admitting: Physician Assistant

## 2016-05-29 VITALS — BP 124/70 | HR 67 | Temp 97.3°F | Resp 14 | Ht 64.5 in | Wt 155.4 lb

## 2016-05-29 DIAGNOSIS — R5383 Other fatigue: Secondary | ICD-10-CM

## 2016-05-29 DIAGNOSIS — D696 Thrombocytopenia, unspecified: Secondary | ICD-10-CM | POA: Diagnosis not present

## 2016-05-29 DIAGNOSIS — R058 Other specified cough: Secondary | ICD-10-CM

## 2016-05-29 DIAGNOSIS — E782 Mixed hyperlipidemia: Secondary | ICD-10-CM | POA: Diagnosis not present

## 2016-05-29 DIAGNOSIS — E559 Vitamin D deficiency, unspecified: Secondary | ICD-10-CM | POA: Diagnosis not present

## 2016-05-29 DIAGNOSIS — R05 Cough: Secondary | ICD-10-CM | POA: Diagnosis not present

## 2016-05-29 DIAGNOSIS — E039 Hypothyroidism, unspecified: Secondary | ICD-10-CM | POA: Diagnosis not present

## 2016-05-29 DIAGNOSIS — M1990 Unspecified osteoarthritis, unspecified site: Secondary | ICD-10-CM

## 2016-05-29 DIAGNOSIS — Z6826 Body mass index (BMI) 26.0-26.9, adult: Secondary | ICD-10-CM

## 2016-05-29 DIAGNOSIS — R6889 Other general symptoms and signs: Secondary | ICD-10-CM | POA: Diagnosis not present

## 2016-05-29 DIAGNOSIS — N183 Chronic kidney disease, stage 3 unspecified: Secondary | ICD-10-CM

## 2016-05-29 DIAGNOSIS — L600 Ingrowing nail: Secondary | ICD-10-CM

## 2016-05-29 DIAGNOSIS — Z0001 Encounter for general adult medical examination with abnormal findings: Secondary | ICD-10-CM | POA: Diagnosis not present

## 2016-05-29 DIAGNOSIS — K219 Gastro-esophageal reflux disease without esophagitis: Secondary | ICD-10-CM | POA: Diagnosis not present

## 2016-05-29 DIAGNOSIS — Z79899 Other long term (current) drug therapy: Secondary | ICD-10-CM | POA: Diagnosis not present

## 2016-05-29 DIAGNOSIS — E041 Nontoxic single thyroid nodule: Secondary | ICD-10-CM

## 2016-05-29 DIAGNOSIS — I10 Essential (primary) hypertension: Secondary | ICD-10-CM | POA: Diagnosis not present

## 2016-05-29 DIAGNOSIS — Z0000 Encounter for general adult medical examination without abnormal findings: Secondary | ICD-10-CM

## 2016-05-29 DIAGNOSIS — B351 Tinea unguium: Secondary | ICD-10-CM

## 2016-05-29 LAB — CBC WITH DIFFERENTIAL/PLATELET
Basophils Absolute: 0 cells/uL (ref 0–200)
Basophils Relative: 0 %
Eosinophils Absolute: 74 cells/uL (ref 15–500)
Eosinophils Relative: 1 %
HCT: 41.6 % (ref 35.0–45.0)
Hemoglobin: 13.8 g/dL (ref 11.7–15.5)
Lymphocytes Relative: 24 %
Lymphs Abs: 1776 cells/uL (ref 850–3900)
MCH: 30 pg (ref 27.0–33.0)
MCHC: 33.2 g/dL (ref 32.0–36.0)
MCV: 90.4 fL (ref 80.0–100.0)
MPV: 10.7 fL (ref 7.5–12.5)
Monocytes Absolute: 592 cells/uL (ref 200–950)
Monocytes Relative: 8 %
Neutro Abs: 4958 cells/uL (ref 1500–7800)
Neutrophils Relative %: 67 %
Platelets: 92 10*3/uL — ABNORMAL LOW (ref 140–400)
RBC: 4.6 MIL/uL (ref 3.80–5.10)
RDW: 13.4 % (ref 11.0–15.0)
WBC: 7.4 10*3/uL (ref 3.8–10.8)

## 2016-05-29 MED ORDER — TERBINAFINE HCL 250 MG PO TABS: 250.0000 mg | ORAL_TABLET | Freq: Every day | ORAL | 1 refills | Status: AC

## 2016-05-29 NOTE — Progress Notes (Signed)
Patient ID: Jillian Rodgers, female   DOB: 16-Apr-1946, 71 y.o.   MRN: [id number]  MEDICARE ANNUAL WELLNESS VISIT AND FOLLOW UP  Assessment:   Hypothyroidism, unspecified hypothyroidism type -cont levothyroxine - TSH  Essential hypertension -cont meds -monitor at home -DASH diet  Hyperlipidemia -cont diet and exercise -cont meds -monitor  Vitamin D deficiency -cont supplement   Medication management - CBC with Differential/Platelet - BASIC METABOLIC PANEL WITH GFR - Hepatic function panel   Gastroesophageal reflux disease, esophagitis presence not specified -cont ranitidine, add prilosec x 2 weeks   Osteoarthritis, unspecified osteoarthritis type, unspecified site -cont vit D3 and calcium  CKD Stage 3 (GFR 36 ml/min) -cont BS and BP - increase fluids   Upper airway cough syndrome - continue GERD med, continue follow up pulm PRN.  BMI 26.71,  adult -exercise and diet counseling give  Thrombocytopenia ([place]) -     CBC with Differential/Platelet  Encounter for Medicare annual wellness exam  Thyroid nodule Need 12 month recheck, + family history thyroid cancer -     US SOFT TISSUE HEAD AND NECK; Future  Fatigue, unspecified type  no specific symptoms with it, check labs, increase fluids Follow up GI for colonoscopy, due next year ? Need for sleep study -     CK -     Sedimentation rate  Onychomycosis with ingrown toenail -     terbinafine (LAMISIL) 250 MG tablet; Take 1 tablet (250 mg total) by mouth daily. Take one daily for 3 months, need liver function lab at 6 weeks.   Over 30 minutes of exam, counseling, chart review, and critical decision making was performed  Future Appointments Date Time Provider [place]  06/20/2016 9:45 AM Starlyn Skeans, PA-C [facility] None  09/26/2016 10:45 AM Unk Pinto, MD [facility] None  03/26/2017 9:00 AM Unk Pinto, MD [facility] None     Plan:   During the course of the visit the  patient was educated and counseled about appropriate screening and preventive services including:    Pneumococcal vaccine   Influenza vaccine  Td vaccine  Prevnar 13  Screening electrocardiogram  Screening mammography  Bone densitometry screening  Colorectal cancer screening  Diabetes screening  Glaucoma screening  Nutrition counseling   Advanced directives: given info/requested copies   Subjective:   Jillian Rodgers is a 71 y.o. female who presents for Medicare Annual Wellness Visit and follow up for her great toenail, fatigue and constipation x 2 weeks.   Her blood pressure has been controlled at home, today their BP is BP: 124/70 She does workout. She denies chest pain, shortness of breath.  She has the dizziness again and has fatigue constantly x 1 week, normal CT head 05/2015. States she will wake up in the middle of the night "choking" feeling that her throat has closed on her and she takes a minute to calm down, sitting up helps. She does snore but states she is well rested in the morning, is on zantac twice a day but occ has GERD.  Going to [facility] on Thursday x 1-2 weeks, visiting family.  She has been having epigastric/LUQ pain with constipation x 2 weeks, last BM yesterday, small hard stool, has not been drinking enough water, has not done anything for the constipation, no black stool/blood in stool, due for colonoscopy next year.   Patient has family history of thyroid cancer, had nodules last year, will repeat since it has been a year.   Follows with Dr. Domingo Cocking at  headache clinic.  Had toenails removed and was on lamisil, nails are looking better at the matrix, has been off x 1 month.  Lab Results  Component Value Date   ALT 13 02/28/2016   AST 16 02/28/2016   ALKPHOS 76 02/28/2016   BILITOT 0.6 02/28/2016   She is on cholesterol medication and denies myalgias. Her cholesterol is at goal. The cholesterol last visit was:   Lab Results  Component  Value Date   CHOL 171 02/28/2016   HDL 66 02/28/2016   LDLCALC 72 02/28/2016   TRIG 163 (H) 02/28/2016   CHOLHDL 2.6 02/28/2016   She has been working on diet and exercise for prediabetes, and denies foot ulcerations, hyperglycemia, hypoglycemia , increased appetite, nausea, paresthesia of the feet, polydipsia, polyuria, visual disturbances, vomiting and weight loss. Last A1C in the office was:  Lab Results  Component Value Date   HGBA1C 5.6 02/28/2016   Last GFR  Lab Results  Component Value Date   GFRNONAA 46 (L) 02/28/2016   Patient is on Vitamin D supplement. Lab Results  Component Value Date   VD25OH 48 02/28/2016     She is on thyroid medication. Her medication was not changed last visit.   Lab Results  Component Value Date   TSH 2.78 02/28/2016  .  BMI is Body mass index is 26.26 kg/m., she is working on diet and exercise. Wt Readings from Last 3 Encounters:  05/29/16 155 lb 6.4 oz (70.5 kg)  02/28/16 158 lb 12.8 oz (72 kg)  02/17/16 157 lb (71.2 kg)     Medication Review Current Outpatient Prescriptions on File Prior to Visit  Medication Sig Dispense Refill  . Ascorbic Acid (VITAMIN C) 500 MG CAPS Take 1 capsule by mouth daily.    [name] Kitchen aspirin 81 MG tablet Take 81 mg by mouth daily.    [name] Kitchen BOTOX 100 units SOLR injection     . cetirizine (ZYRTEC) 10 MG tablet Take 10 mg by mouth daily.    . Cholecalciferol (VITAMIN D PO) Take 5,000 Units by mouth daily.    [name] Kitchen erythromycin with ethanol (THERAMYCIN) 2 % external solution Apply topically daily. 60 mL 6  . gabapentin (NEURONTIN) 600 MG tablet Take 600 mg by mouth 4 (four) times daily.     [name] Kitchen levothyroxine (SYNTHROID, LEVOTHROID) 50 MCG tablet TAKE 1 TABLET BY MOUTH EVERY MORNING ON AN EMPTY STOMACH 90 tablet 0  . OVER THE COUNTER MEDICATION Taking Tangy Tangerine tabs 2.0 # 2 tabs daily.    . pravastatin (PRAVACHOL) 40 MG tablet TAKE 1 TABLET BY MOUTH AT BEDTIME 90 tablet 1  . ranitidine (ZANTAC) 300 MG tablet TAKE ONE  TABLET BY MOUTH TWICE DAILY FOR ACID INDIGESTION AND REFLUX 180 tablet 1  . benzonatate (TESSALON) 100 MG capsule TAKE 2 CAPSULES(200 MG) BY MOUTH THREE TIMES DAILY AS NEEDED FOR COUGH. MAX: 600 MG PER DAY (Patient not taking: Reported on 05/29/2016) 60 capsule 0  . levofloxacin (LEVAQUIN) 500 MG tablet Take 1 tablet daily with food for infection (Patient not taking: Reported on 05/29/2016) 15 tablet 1  . predniSONE (DELTASONE) 20 MG tablet 1 tab 3 x day for 3 days, then 1 tab 2 x day for 3 days, then 1 tab 1 x day for 5 days (Patient not taking: Reported on 05/29/2016) 20 tablet 0   No current facility-administered medications on file prior to visit.     Current Problems (verified) Patient Active Problem List   Diagnosis Date Noted  .  Thrombocytopenia ([place]) 11/25/2015  . Encounter for Medicare annual wellness exam 06/14/2015  . BMI 26.71,  adult 01/28/2015  . CKD Stage 3 (GFR 36 ml/min) 01/19/2014  . Upper airway cough syndrome 12/16/2013  . Vitamin D deficiency 05/21/2013  . Prediabetes 05/21/2013  . Medication management 05/21/2013  . Essential hypertension 01/31/2013  . Degenerative joint disease   . GERD (gastroesophageal reflux disease)   . Hyperlipidemia   . Hypothyroidism     Screening Tests Immunization History  Administered Date(s) Administered  . Influenza, High Dose Seasonal PF 12/29/2013, 01/15/2015, 02/01/2016  . Influenza-Unspecified 01/24/2011, 02/02/2012  . Pneumococcal Conjugate-13 10/28/2014  . Pneumococcal-Unspecified 11/28/2011  . Td 11/17/2010  . Tdap 11/12/2014  . Zoster 11/17/2010    Preventative care: Last colonoscopy: 2009 due next year Last mammogram: 2016 DUE DEXA:2017  Prior vaccinations: TD or Tdap: 2016  Influenza: 2017 Pneumococcal: 2013 Prevnar13: 2016 Shingles/Zostavax: 2016  Names of Other Physician/Practitioners you currently use: 1. [facility]- here for primary care 2. Dr. Gershon Crane, eye doctor,  last visit 2017 , has appt coming up 3. Dr. Mauro Kaufmann, dentist, last visit today Patient Care Team: Unk Pinto, MD as PCP - General (Internal Medicine)  Allergies No Known Allergies  SURGICAL HISTORY She  has a past surgical history that includes Ventral hernia repair; Cholecystectomy (1996); Pilonidal cyst excision (1972); Tonsillectomy; Colonoscopy; and Upper gi endoscopy. FAMILY HISTORY Her family history includes Breast cancer in her sister; Ovarian cancer in her mother; Stroke in her paternal uncle; Thyroid cancer in her sister. SOCIAL HISTORY She  reports that she has never smoked. She has never used smokeless tobacco. She reports that she does not drink alcohol or use drugs.  MEDICARE WELLNESS OBJECTIVES: Physical activity: Current Exercise Habits: The patient does not participate in regular exercise at present Cardiac risk factors: Cardiac Risk Factors include: advanced age (>79men, >27 women);dyslipidemia;hypertension;sedentary lifestyle Depression/mood screen:   Depression screen [facility] 2/9 05/29/2016  Decreased Interest 0  Down, Depressed, Hopeless 0  PHQ - 2 Score 0    ADLs:  In your present state of health, do you have any difficulty performing the following activities: 05/29/2016 02/28/2016  Hearing? N N  Vision? N N  Difficulty concentrating or making decisions? N N  Walking or climbing stairs? N N  Dressing or bathing? N N  Doing errands, shopping? N N  Some recent data might be hidden     Cognitive Testing  Alert? Yes  Normal Appearance?Yes  Oriented to person? Yes  Place? Yes   Time? Yes  Recall of three objects?  Yes  Can perform simple calculations? Yes  Displays appropriate judgment?Yes  Can read the correct time from a watch face?Yes  EOL planning: Does Patient Have a Medical Advance Directive?: No Would patient like information on creating a medical advance directive?: Yes (ED - Information included in AVS)   Objective:   Today's Vitals   05/29/16  1125  BP: 124/70  Pulse: 67  Resp: 14  Temp: 97.3 F (36.3 C)  SpO2: 97%  Weight: 155 lb 6.4 oz (70.5 kg)  Height: 5' 4.5" (1.638 m)  PainSc: 3   PainLoc: Abdomen   Body mass index is 26.26 kg/m.  General appearance: alert, no distress, WD/WN,  female HEENT: normocephalic, sclerae anicteric, TMs pearly, nares patent, no discharge or erythema, pharynx normal Oral cavity: MMM, no lesions Neck: supple, no lymphadenopathy, no thyromegaly, no masses Heart: RRR, normal S1, S2, no murmurs Lungs: CTA bilaterally, no wheezes, rhonchi, or  rales Abdomen: +bs, soft, + tender epigastric and LUQ, no rebound tenderness, non distended, no masses, no hepatomegaly, no splenomegaly Musculoskeletal: nontender, no swelling, no obvious deformity Extremities: no edema, no cyanosis, no clubbing Pulses: 2+ symmetric, upper and lower extremities, normal cap refill Neurological: alert, oriented x 3, CN2-12 intact, strength normal upper extremities and lower extremities, sensation normal throughout, DTRs 2+ throughout, no cerebellar signs, gait normal Psychiatric: normal affect, behavior normal, pleasant  Breast: defer Gyn: defer Rectal: defer   Medicare Attestation I have personally reviewed: The patient's medical and social history Their use of alcohol, tobacco or illicit drugs Their current medications and supplements The patient's functional ability including ADLs,fall risks, home safety risks, cognitive, and hearing and visual impairment Diet and physical activities Evidence for depression or mood disorders  The patient's weight, height, BMI, and visual acuity have been recorded in the chart.  I have made referrals, counseling, and provided education to the patient based on review of the above and I have provided the patient with a written personalized care plan for preventive services.     Vicie Mutters, PA-C   05/29/2016

## 2016-05-29 NOTE — Patient Instructions (Addendum)
Try OTC 20mg  prilosec at night x 2 weeks with the zantac at night, do as needed if having the "choking" sensation in the night Ask husband or record self to see if any sleep apnea, try not to sleep on back  Increase water do at least 64 oz a day AT LEAST, 80 oz is best  I think it is possible that you have sleep apnea. It can cause interrupted sleep, headaches, frequent awakenings, fatigue, dry mouth, fast/slow heart beats, memory issues, anxiety/depression, swelling, numbness tingling hands/feet, weight gain, shortness of breath, and the list goes on. Sleep apnea needs to be ruled out because if it is left untreated it does eventually lead to abnormal heart beats, lung failure or heart failure as well as increasing the risk of heart attack and stroke. There are masks you can wear OR a mouth piece that I can give you information about. Often times though people feel MUCH better after getting treatment.   Sleep Apnea  Sleep apnea is a sleep disorder characterized by abnormal pauses in breathing while you sleep. When your breathing pauses, the level of oxygen in your blood decreases. This causes you to move out of deep sleep and into light sleep. As a result, your quality of sleep is poor, and the system that carries your blood throughout your body (cardiovascular system) experiences stress. If sleep apnea remains untreated, the following conditions can develop:  High blood pressure (hypertension).  Coronary artery disease.  Inability to achieve or maintain an erection (impotence).  Impairment of your thought process (cognitive dysfunction). There are three types of sleep apnea: 1. Obstructive sleep apnea--Pauses in breathing during sleep because of a blocked airway. 2. Central sleep apnea--Pauses in breathing during sleep because the area of the brain that controls your breathing does not send the correct signals to the muscles that control breathing. 3. Mixed sleep apnea--A combination of both  obstructive and central sleep apnea.  RISK FACTORS The following risk factors can increase your risk of developing sleep apnea:  Being overweight.  Smoking.  Having narrow passages in your nose and throat.  Being of older age.  Being female.  Alcohol use.  Sedative and tranquilizer use.  Ethnicity. Among individuals younger than 35 years, African Americans are at increased risk of sleep apnea. SYMPTOMS   Difficulty staying asleep.  Daytime sleepiness and fatigue.  Loss of energy.  Irritability.  Loud, heavy snoring.  Morning headaches.  Trouble concentrating.  Forgetfulness.  Decreased interest in sex. DIAGNOSIS  In order to diagnose sleep apnea, your caregiver will perform a physical examination. Your caregiver may suggest that you take a home sleep test. Your caregiver may also recommend that you spend the night in a sleep lab. In the sleep lab, several monitors record information about your heart, lungs, and brain while you sleep. Your leg and arm movements and blood oxygen level are also recorded. TREATMENT The following actions may help to resolve mild sleep apnea:  Sleeping on your side.   Using a decongestant if you have nasal congestion.   Avoiding the use of depressants, including alcohol, sedatives, and narcotics.   Losing weight and modifying your diet if you are overweight. There also are devices and treatments to help open your airway:  Oral appliances. These are custom-made mouthpieces that shift your lower jaw forward and slightly open your bite. This opens your airway.  Devices that create positive airway pressure. This positive pressure "splints" your airway open to help you breathe better during sleep.  The following devices create positive airway pressure:  Continuous positive airway pressure (CPAP) device. The CPAP device creates a continuous level of air pressure with an air pump. The air is delivered to your airway through a mask while you  sleep. This continuous pressure keeps your airway open.  Nasal expiratory positive airway pressure (EPAP) device. The EPAP device creates positive air pressure as you exhale. The device consists of single-use valves, which are inserted into each nostril and held in place by adhesive. The valves create very little resistance when you inhale but create much more resistance when you exhale. That increased resistance creates the positive airway pressure. This positive pressure while you exhale keeps your airway open, making it easier to breath when you inhale again.  Bilevel positive airway pressure (BPAP) device. The BPAP device is used mainly in patients with central sleep apnea. This device is similar to the CPAP device because it also uses an air pump to deliver continuous air pressure through a mask. However, with the BPAP machine, the pressure is set at two different levels. The pressure when you exhale is lower than the pressure when you inhale.  Surgery. Typically, surgery is only done if you cannot comply with less invasive treatments or if the less invasive treatments do not improve your condition. Surgery involves removing excess tissue in your airway to create a wider passage way. Document Released: 03/24/2002 Document Revised: 07/29/2012 Document Reviewed: 08/10/2011 [facility] Patient Information 2015 [place], [place]. This information is not intended to replace advice given to you by your health care provider. Make sure you discuss any questions you have with your health care provider.   Food Choices for Gastroesophageal Reflux Disease, Adult When you have gastroesophageal reflux disease (GERD), the foods you eat and your eating habits are very important. Choosing the right foods can help ease your discomfort. What guidelines do I need to follow?  Choose fruits, vegetables, whole grains, and low-fat dairy products.  Choose low-fat meat, fish, and poultry.  Limit fats such as oils, salad  dressings, butter, nuts, and avocado.  Keep a food diary. This helps you identify foods that cause symptoms.  Avoid foods that cause symptoms. These may be different for everyone.  Eat small meals often instead of 3 large meals a day.  Eat your meals slowly, in a place where you are relaxed.  Limit fried foods.  Cook foods using methods other than frying.  Avoid drinking alcohol.  Avoid drinking large amounts of liquids with your meals.  Avoid bending over or lying down until 2-3 hours after eating. What foods are not recommended? These are some foods and drinks that may make your symptoms worse: Vegetables  Tomatoes. Tomato juice. Tomato and spaghetti sauce. Chili peppers. Onion and garlic. Horseradish. Fruits  Oranges, grapefruit, and lemon (fruit and juice). Meats  High-fat meats, fish, and poultry. This includes hot dogs, ribs, ham, sausage, salami, and bacon. Dairy  Whole milk and chocolate milk. Sour cream. Cream. Butter. Ice cream. Cream cheese. Drinks  Coffee and tea. Bubbly (carbonated) drinks or energy drinks. Condiments  Hot sauce. Barbecue sauce. Sweets/Desserts  Chocolate and cocoa. Donuts. Peppermint and spearmint. Fats and Oils  High-fat foods. This includes [place] fries and potato chips. Other  Vinegar. Strong spices. This includes black pepper, white pepper, red pepper, cayenne, curry powder, cloves, ginger, and chili powder. The items listed above may not be a complete list of foods and drinks to avoid. Contact your dietitian for more information.  This information is  not intended to replace advice given to you by your health care provider. Make sure you discuss any questions you have with your health care provider. Document Released: 10/03/2011 Document Revised: 09/09/2015 Document Reviewed: 02/05/2013 Elsevier Interactive Patient Education  2017 [employer].

## 2016-05-30 LAB — BASIC METABOLIC PANEL WITH GFR
BUN: 22 mg/dL (ref 7–25)
CO2: 26 mmol/L (ref 20–31)
Calcium: 10.6 mg/dL — ABNORMAL HIGH (ref 8.6–10.4)
Chloride: 106 mmol/L (ref 98–110)
Creat: 1.24 mg/dL — ABNORMAL HIGH (ref 0.60–0.93)
GFR, Est African American: 50 mL/min — ABNORMAL LOW (ref >=60)
GFR, Est Non African American: 43 mL/min — ABNORMAL LOW (ref >=60)
Glucose, Bld: 82 mg/dL (ref 65–99)
Potassium: 4 mmol/L (ref 3.5–5.3)
Sodium: 141 mmol/L (ref 135–146)

## 2016-05-30 LAB — LIPID PANEL
Cholesterol: 171 mg/dL (ref <200)
HDL: 69 mg/dL (ref >50)
LDL Cholesterol: 81 mg/dL (ref <100)
Total CHOL/HDL Ratio: 2.5 Ratio (ref <5.0)
Triglycerides: 105 mg/dL (ref <150)
VLDL: 21 mg/dL (ref <30)

## 2016-05-30 LAB — CK: Total CK: 38 U/L (ref 7–177)

## 2016-05-30 LAB — SEDIMENTATION RATE: Sed Rate: 7 mm/hr (ref 0–30)

## 2016-05-30 LAB — HEPATIC FUNCTION PANEL
ALT: 13 U/L (ref 6–29)
AST: 17 U/L (ref 10–35)
Albumin: 4.2 g/dL (ref 3.6–5.1)
Alkaline Phosphatase: 72 U/L (ref 33–130)
Bilirubin, Direct: 0.1 mg/dL (ref <=0.2)
Indirect Bilirubin: 0.4 mg/dL (ref 0.2–1.2)
Total Bilirubin: 0.5 mg/dL (ref 0.2–1.2)
Total Protein: 6.5 g/dL (ref 6.1–8.1)

## 2016-05-30 LAB — MAGNESIUM: Magnesium: 2.2 mg/dL (ref 1.5–2.5)

## 2016-05-30 LAB — TSH: TSH: 1.29 mIU/L

## 2016-05-30 LAB — VITAMIN D 25 HYDROXY (VIT D DEFICIENCY, FRACTURES): Vit D, 25-Hydroxy: 83 ng/mL (ref 30–100)

## 2016-05-31 NOTE — Progress Notes (Signed)
Pt aware of lab results & voiced understanding of those results.

## 2016-06-01 DIAGNOSIS — H20041 Secondary noninfectious iridocyclitis, right eye: Secondary | ICD-10-CM | POA: Insufficient documentation

## 2016-06-01 DIAGNOSIS — Z961 Presence of intraocular lens: Secondary | ICD-10-CM | POA: Insufficient documentation

## 2016-06-13 ENCOUNTER — Ambulatory Visit: Payer: Self-pay | Admitting: Internal Medicine

## 2016-06-20 ENCOUNTER — Ambulatory Visit: Payer: Self-pay | Admitting: Internal Medicine

## 2016-07-18 ENCOUNTER — Ambulatory Visit
Admission: RE | Admit: 2016-07-18 | Discharge: 2016-07-18 | Disposition: A | Discharge status: Home / Self Care | Payer: Medicare Other | Source: Ambulatory Visit | Attending: Physician Assistant | Admitting: Physician Assistant

## 2016-07-18 DIAGNOSIS — E041 Nontoxic single thyroid nodule: Secondary | ICD-10-CM

## 2016-07-19 NOTE — Progress Notes (Signed)
LVM for pt to return office call for LAB results.

## 2016-07-23 ENCOUNTER — Other Ambulatory Visit: Payer: Self-pay | Admitting: Internal Medicine

## 2016-08-10 ENCOUNTER — Ambulatory Visit (INDEPENDENT_AMBULATORY_CARE_PROVIDER_SITE_OTHER): Payer: Medicare Other | Admitting: Internal Medicine

## 2016-08-10 ENCOUNTER — Encounter: Payer: Self-pay | Admitting: Internal Medicine

## 2016-08-10 VITALS — BP 122/80 | HR 76 | Temp 97.8°F | Resp 16 | Ht 64.5 in | Wt 160.0 lb

## 2016-08-10 DIAGNOSIS — J40 Bronchitis, not specified as acute or chronic: Secondary | ICD-10-CM

## 2016-08-10 MED ORDER — AZITHROMYCIN 250 MG PO TABS: ORAL_TABLET | ORAL | 1 refills | Status: DC

## 2016-08-10 MED ORDER — PREDNISONE 20 MG PO TABS: ORAL_TABLET | ORAL | 0 refills | Status: DC

## 2016-08-10 MED ORDER — BENZONATATE 200 MG PO CAPS: ORAL_CAPSULE | ORAL | 1 refills | Status: DC

## 2016-08-10 NOTE — Progress Notes (Signed)
   Subjective:    Patient ID: Jillian Rodgers, female    DOB: September 28, 1945, 71 y.o.   MRN: [id number]  HPI  This nice 71 yo MWF with prior hx/o bronchitis predents with 1 week prodrome of cough becoming more productive of a yellowish green sputum. She denies fever, chils, sweats, rash or dyspnea.   Medication Sig  . Ascorbic Acid (VITAMIN C) 500 MG CAPS Take 1 capsule by mouth daily.  [name] Kitchen aspirin 81 MG tablet Take 81 mg by mouth daily.  [name] Kitchen BOTOX 100 units SOLR injection   . cetirizine (ZYRTEC) 10 MG tablet Take 10 mg by mouth daily.  . Cholecalciferol (VITAMIN D PO) Take 5,000 Units by mouth daily.  [name] Kitchen erythromycin with ethanol (THERAMYCIN) 2 % external solution Apply topically daily.  [name] Kitchen gabapentin (NEURONTIN) 600 MG tablet Take 600 mg by mouth 4 (four) times daily.   [name] Kitchen levothyroxine (SYNTHROID, LEVOTHROID) 50 MCG tablet TAKE 1 TABLET BY MOUTH EVERY MORNING ON AN EMPTY STOMACH  . OVER THE COUNTER MEDICATION Taking Tangy Tangerine tabs 2.0 # 2 tabs daily.  . pravastatin (PRAVACHOL) 40 MG tablet TAKE 1 TABLET BY MOUTH AT BEDTIME  . ranitidine (ZANTAC) 300 MG tablet TAKE ONE TABLET BY MOUTH TWICE DAILY FOR ACID INDIGESTION AND REFLUX  . terbinafine (LAMISIL) 250 MG tablet Take 1 tablet (250 mg total) by mouth daily. Take one daily for 3 months, need liver function lab at 6 weeks.   No Known Allergies   Past Medical History:  Diagnosis Date  . Degenerative joint disease   . Diabetes mellitus without complication ([place])   . GERD (gastroesophageal reflux disease)   . Hyperlipidemia   . Hypertension   . Migraines    neurontin helps  . Pre-diabetes   . Thyroid disease    Review of Systems  10 point systems review negative except as above.    Objective:   Physical Exam  BP 122/80   Pulse 76   Temp 97.8 F (36.6 C)   Resp 16   Ht 5' 4.5" (1.638 m)   Wt 160 lb (72.6 kg)   BMI 27.04 kg/m   Brassy congested cough. No Stridor.   HEENT - Eac's patent. TM's Nl. EOM's full.  PERRLA. NasoOroPharynx clear. Neck - supple. Nl Thyroid. Carotids 2+ & No bruits, nodes, JVD Chest - Scattered rales and rhonchi and no wheezes. Cor - Nl HS. RRR w/o sig m MS- FROM. Gait Nl. Neuro -  Nl w/o focal abnormalities. Skin - exposed - clear    Assessment & Plan:   1. Bronchitis  - predniSONE (DELTASONE) 20 MG tablet; 1 tab 3 x day for 3 days, then 1 tab 2 x day for 3 days, then 1 tab 1 x day for 5 days  Dispense: 20 tablet; Refill: 0  - azithromycin (ZITHROMAX) 250 MG tablet; Take 2 tablets (500 mg) on  Day 1,  followed by 1 tablet (250 mg) once daily on Days 2 through 5.  Dispense: 6 each; Refill: 1  - benzonatate (TESSALON) 200 MG capsule; Take 1 perle 3 x / day to prevent cough  Dispense: 30 capsule; Refill: 1  - Recommended use Delsym - discussed meds, SE's and ROV prn.

## 2016-08-11 ENCOUNTER — Telehealth: Payer: Self-pay | Admitting: *Deleted

## 2016-08-11 NOTE — Telephone Encounter (Signed)
Patient called and asked if she can reduce her Prednisone dose.  Per Dr Melford Aase, it is OK to reduce the dose by 1/2. The patient is aware.

## 2016-08-12 ENCOUNTER — Other Ambulatory Visit: Payer: Self-pay | Admitting: Internal Medicine

## 2016-08-28 ENCOUNTER — Telehealth: Payer: Self-pay | Admitting: *Deleted

## 2016-08-28 MED ORDER — LEVOFLOXACIN 500 MG PO TABS: 500.0000 mg | ORAL_TABLET | Freq: Every day | ORAL | 0 refills | Status: DC

## 2016-08-28 NOTE — Telephone Encounter (Signed)
Patient called and states she has finished her z-pak, but still has a cough and does not feel well.  Per Dr Melford Aase, send an RX for Levaquin 500 mg.  Patient is aware.

## 2016-08-29 ENCOUNTER — Other Ambulatory Visit: Payer: Self-pay | Admitting: Orthopedic Surgery

## 2016-08-29 DIAGNOSIS — M25531 Pain in right wrist: Secondary | ICD-10-CM

## 2016-09-04 ENCOUNTER — Other Ambulatory Visit: Payer: Self-pay | Admitting: Internal Medicine

## 2016-09-04 DIAGNOSIS — J40 Bronchitis, not specified as acute or chronic: Secondary | ICD-10-CM

## 2016-09-12 ENCOUNTER — Ambulatory Visit (INDEPENDENT_AMBULATORY_CARE_PROVIDER_SITE_OTHER): Payer: Medicare Other | Admitting: Internal Medicine

## 2016-09-12 ENCOUNTER — Encounter: Payer: Self-pay | Admitting: Internal Medicine

## 2016-09-12 VITALS — BP 108/70 | HR 76 | Temp 97.9°F | Resp 16 | Ht 64.5 in | Wt 159.2 lb

## 2016-09-12 DIAGNOSIS — J0140 Acute pansinusitis, unspecified: Secondary | ICD-10-CM

## 2016-09-12 DIAGNOSIS — J40 Bronchitis, not specified as acute or chronic: Secondary | ICD-10-CM

## 2016-09-12 MED ORDER — DOXYCYCLINE HYCLATE 100 MG PO CAPS: ORAL_CAPSULE | ORAL | 0 refills | Status: DC

## 2016-09-12 MED ORDER — BENZONATATE 200 MG PO CAPS: ORAL_CAPSULE | ORAL | 2 refills | Status: DC

## 2016-09-12 NOTE — Progress Notes (Signed)
Subjective:    Patient ID: Jillian Rodgers, female    DOB: 11/15/1945, 71 y.o.   MRN: [id number]  HPI  This nice 71 yo MWF returns for f/u after Tx of URI & Bronchitis on 4/26 with a Z pak, then refilled 5 days post treatment about May 5th. Then on 5/14 a Rx Levaquin was sent in x 10 days and now she's off treatment about 5 days with persistent sinus pressure and discomfort and congested cough. Denies fever, chills, rigors, sweats, dyspnea or rash.Sputum is reported clear with occasional purulent component.  Outpatient Medications Prior to Visit  Medication Sig Dispense Refill  . Ascorbic Acid (VITAMIN C) 500 MG CAPS Take 1 capsule by mouth daily.    [name] Kitchen aspirin 81 MG tablet Take 81 mg by mouth daily.    [name] Kitchen BOTOX 100 units SOLR injection     . cetirizine (ZYRTEC) 10 MG tablet Take 10 mg by mouth daily.    . Cholecalciferol (VITAMIN D PO) Take 5,000 Units by mouth daily.    [name] Kitchen erythromycin with ethanol (THERAMYCIN) 2 % external solution Apply topically daily. 60 mL 6  . gabapentin (NEURONTIN) 600 MG tablet Take 600 mg by mouth 4 (four) times daily.     [name] Kitchen ketorolac (ACULAR) 0.5 % ophthalmic solution Place 1 drop into the right eye 4 (four) times daily.    [name] Kitchen levothyroxine (SYNTHROID, LEVOTHROID) 50 MCG tablet TAKE 1 TABLET BY MOUTH EVERY MORNING ON AN EMPTY STOMACH 90 tablet 1  . OVER THE COUNTER MEDICATION Taking Tangy Tangerine tabs 2.0 # 2 tabs daily.    . pravastatin (PRAVACHOL) 40 MG tablet TAKE 1 TABLET BY MOUTH AT BEDTIME 90 tablet 1  . ranitidine (ZANTAC) 300 MG tablet TAKE ONE TABLET BY MOUTH TWICE DAILY FOR ACID INDIGESTION AND REFLUX 180 tablet 1  . benzonatate (TESSALON) 200 MG capsule TAKE 1 CAPSULE BY MOUTH THREE TIMES DAILY TO PREVENT COUGH 30 capsule 0  . azithromycin (ZITHROMAX) 250 MG tablet Take 2 tablets (500 mg) on  Day 1,  followed by 1 tablet (250 mg) once daily on Days 2 through 5. 6 each 1  . levofloxacin (LEVAQUIN) 500 MG tablet Take 1 tablet (500 mg total) by  mouth daily. Take with food. 10 tablet 0  . predniSONE (DELTASONE) 20 MG tablet 1 tab 3 x day for 3 days, then 1 tab 2 x day for 3 days, then 1 tab 1 x day for 5 days 20 tablet 0   No facility-administered medications prior to visit.    No Known Allergies Past Medical History:  Diagnosis Date  . Degenerative joint disease   . Diabetes mellitus without complication ([place])   . GERD (gastroesophageal reflux disease)   . Hyperlipidemia   . Hypertension   . Migraines    neurontin helps  . Pre-diabetes   . Thyroid disease    Past Surgical History:  Procedure Laterality Date  . CHOLECYSTECTOMY  1996   open  . COLONOSCOPY    . PILONIDAL CYST EXCISION  1972  . TONSILLECTOMY    . UPPER GI ENDOSCOPY    . VENTRAL HERNIA REPAIR     Review of Systems  10 point systems review negative except as above.    Objective:   Physical Exam  BP 108/70   Pulse 76   Temp 97.9 F (36.6 C)   Resp 16   Ht 5' 4.5" (1.638 m)   Wt 159 lb 3.2 oz (72.2 kg)   BMI 26.90  kg/m   Brassy barking cough.   HEENT - Eac's patent. TM's sl dull & retracted. EOM's full. Bilat frontal & maxillary tenderness.  PERRLA. NasoOroPharynx clear. Neck - supple. Nl Thyroid. Carotids 2+ & No bruits, nodes, JVD Chest - Clear equal BS w/o Rales, rhonchi, wheezes. Cor - Nl HS. RRR w/o sig m. MS- FROM w/o deformities. Muscle power, tone and bulk Nl. Gait Nl. Neuro -  Nl w/o focal abnormalities.    Assessment & Plan:   1. Pansinusitis   2. Tracheobronchitis  - benzonatate (TESSALON) 200 MG capsule; TAKE 1 CAPSULE BY MOUTH THREE TIMES DAILY TO PREVENT COUGH  Dispense: 30 capsule; Refill: 2  - doxycycline (VIBRAMYCIN) 100 MG capsule; Take 1 capsule 2 x/day with food for 5 days -  then 1 x/day with food for 10 days  Dispense: 20 capsule; Refill: 0  - discussed meds & SE's.

## 2016-09-12 NOTE — Progress Notes (Signed)
   Subjective:    Patient ID: Jillian Rodgers, female    DOB: 01/26/1946, 71 y.o.   MRN: [id number]  HPI    Review of Systems     Objective:   Physical Exam        Assessment & Plan:

## 2016-09-12 NOTE — Patient Instructions (Signed)

## 2016-09-13 ENCOUNTER — Ambulatory Visit
Admission: RE | Admit: 2016-09-13 | Discharge: 2016-09-13 | Disposition: A | Discharge status: Home / Self Care | Payer: Medicare Other | Source: Ambulatory Visit | Attending: Orthopedic Surgery | Admitting: Orthopedic Surgery

## 2016-09-13 ENCOUNTER — Encounter: Payer: Self-pay | Admitting: Internal Medicine

## 2016-09-13 DIAGNOSIS — M25531 Pain in right wrist: Secondary | ICD-10-CM

## 2016-09-13 MED ORDER — IOPAMIDOL (ISOVUE-M 200) INJECTION 41%
20.0000 mL | Freq: Once | INTRAMUSCULAR | Status: AC
  Administered 2016-09-13: 20 mL via INTRA_ARTICULAR

## 2016-09-26 ENCOUNTER — Ambulatory Visit (INDEPENDENT_AMBULATORY_CARE_PROVIDER_SITE_OTHER): Payer: Medicare Other | Admitting: Internal Medicine

## 2016-09-26 ENCOUNTER — Encounter: Payer: Self-pay | Admitting: Internal Medicine

## 2016-09-26 VITALS — BP 124/78 | HR 64 | Temp 97.0°F | Resp 16 | Ht 64.5 in | Wt 159.8 lb

## 2016-09-26 DIAGNOSIS — E782 Mixed hyperlipidemia: Secondary | ICD-10-CM

## 2016-09-26 DIAGNOSIS — J40 Bronchitis, not specified as acute or chronic: Secondary | ICD-10-CM

## 2016-09-26 DIAGNOSIS — E559 Vitamin D deficiency, unspecified: Secondary | ICD-10-CM

## 2016-09-26 DIAGNOSIS — N183 Chronic kidney disease, stage 3 unspecified: Secondary | ICD-10-CM

## 2016-09-26 DIAGNOSIS — I10 Essential (primary) hypertension: Secondary | ICD-10-CM

## 2016-09-26 DIAGNOSIS — E039 Hypothyroidism, unspecified: Secondary | ICD-10-CM

## 2016-09-26 DIAGNOSIS — R7303 Prediabetes: Secondary | ICD-10-CM

## 2016-09-26 LAB — BASIC METABOLIC PANEL WITH GFR
BUN: 25 mg/dL (ref 7–25)
CO2: 28 mmol/L (ref 20–31)
Calcium: 9.4 mg/dL (ref 8.6–10.4)
Chloride: 105 mmol/L (ref 98–110)
Creat: 1.05 mg/dL — ABNORMAL HIGH (ref 0.60–0.93)
GFR, Est African American: 61 mL/min (ref >=60)
GFR, Est Non African American: 53 mL/min — ABNORMAL LOW (ref >=60)
Glucose, Bld: 65 mg/dL (ref 65–99)
Potassium: 4.1 mmol/L (ref 3.5–5.3)
Sodium: 140 mmol/L (ref 135–146)

## 2016-09-26 LAB — HEPATIC FUNCTION PANEL
ALT: 15 U/L (ref 6–29)
AST: 17 U/L (ref 10–35)
Albumin: 3.7 g/dL (ref 3.6–5.1)
Alkaline Phosphatase: 62 U/L (ref 33–130)
Bilirubin, Direct: 0.1 mg/dL (ref <=0.2)
Indirect Bilirubin: 0.4 mg/dL (ref 0.2–1.2)
Total Bilirubin: 0.5 mg/dL (ref 0.2–1.2)
Total Protein: 6 g/dL — ABNORMAL LOW (ref 6.1–8.1)

## 2016-09-26 LAB — LIPID PANEL
Cholesterol: 184 mg/dL (ref <200)
HDL: 82 mg/dL (ref >50)
LDL Cholesterol: 68 mg/dL (ref <100)
Total CHOL/HDL Ratio: 2.2 Ratio (ref <5.0)
Triglycerides: 170 mg/dL — ABNORMAL HIGH (ref <150)
VLDL: 34 mg/dL — ABNORMAL HIGH (ref <30)

## 2016-09-26 LAB — TSH: TSH: 2.84 mIU/L

## 2016-09-26 NOTE — Patient Instructions (Signed)

## 2016-09-26 NOTE — Progress Notes (Signed)
This very nice 71 y.o. MWF presents for 6 month follow up with Hypertension, Hyperlipidemia, Pre-Diabetes and Vitamin D Deficiency.      Patient is treated for HTN (1990's) & BP has been controlled at home. Today's BP is at goal - 124/78. Patient has had no complaints of any cardiac type chest pain, palpitations, dyspnea/orthopnea/PND, dizziness, claudication, or dependent edema.     Hyperlipidemia is controlled with diet & meds. Patient denies myalgias or other med SE's. Last Lipids were at goal: Lab Results  Component Value Date   CHOL 171 05/29/2016   HDL 69 05/29/2016   LDLCALC 81 05/29/2016   TRIG 105 05/29/2016   CHOLHDL 2.5 05/29/2016      Also, the patient has history of PreDiabetes (A1c 6.0% in 2012)  and has had no symptoms of reactive hypoglycemia, diabetic polys, paresthesias or visual blurring.  Last A1c was at goal: Lab Results  Component Value Date   HGBA1C 5.6 02/28/2016      Further, the patient also has history of Vitamin D Deficiency ("39" in 2012)  and supplements vitamin D without any suspected side-effects. Last vitamin D was at goal:  Lab Results  Component Value Date   VD25OH 83 05/29/2016   Current Outpatient Prescriptions on File Prior to Visit  Medication Sig  . VITAMIN C 500 MG CAPS Take 1 capsule by mouth daily.  [name] Kitchen aspirin 81 MG tablet Take 81 mg by mouth daily.  [name] Kitchen BOTOX 100 units  injection   . Cetirizine 10 MG tablet Take 10 mg by mouth daily.  [name] Kitchen VITAMIN D Take 5,000 Units by mouth daily.  . THERAMYCIN 2 % ext soln Apply topically daily.  [name] Kitchen NEURONTIN 600 MG tablet Take 600 mg by mouth 4 (four) times daily.   [name] Kitchen levothyroxine  50 MCG tablet TAKE 1 TAB EVERY MORNING O  . Tangy Tangerine tabs Taking  2.0 # 2 tabs daily.  . Pravastatin 40 MG tablet TAKE 1 TABLET BY MOUTH AT BEDTIME  . ranitidine  300 MG tablet TAKE ONE TAB TWICE DAILY FOR ACID INDIGESTION    No Known Allergies  PMHx:   Past Medical History:  Diagnosis Date  . Degenerative  joint disease   . Diabetes mellitus without complication ([place])   . GERD (gastroesophageal reflux disease)   . Hyperlipidemia   . Hypertension   . Migraines    neurontin helps  . Pre-diabetes   . Thyroid disease    Immunization History  Administered Date(s) Administered  . Influenza, High Dose Seasonal PF 12/29/2013, 01/15/2015, 02/01/2016  . Influenza-Unspecified 01/24/2011, 02/02/2012  . Pneumococcal Conjugate-13 10/28/2014  . Pneumococcal-Unspecified 11/28/2011  . Td 11/17/2010  . Tdap 11/12/2014  . Zoster 11/17/2010   Past Surgical History:  Procedure Laterality Date  . CHOLECYSTECTOMY  1996   open  . COLONOSCOPY    . PILONIDAL CYST EXCISION  1972  . TONSILLECTOMY    . UPPER GI ENDOSCOPY    . VENTRAL HERNIA REPAIR     FHx:    Reviewed / unchanged  SHx:    Reviewed / unchanged  Systems Review:  Constitutional: Denies fever, chills, wt changes, headaches, insomnia, fatigue, night sweats, change in appetite. Eyes: Denies redness, blurred vision, diplopia, discharge, itchy, watery eyes.  ENT: Denies discharge, congestion, post nasal drip, epistaxis, sore throat, earache, hearing loss, dental pain, tinnitus, vertigo, sinus pain, snoring.  CV: Denies chest pain, palpitations, irregular heartbeat, syncope, dyspnea, diaphoresis, orthopnea, PND, claudication or edema. Respiratory: denies  cough, dyspnea, DOE, pleurisy, hoarseness, laryngitis, wheezing.  Gastrointestinal: Denies dysphagia, odynophagia, heartburn, reflux, water brash, abdominal pain or cramps, nausea, vomiting, bloating, diarrhea, constipation, hematemesis, melena, hematochezia  or hemorrhoids. Genitourinary: Denies dysuria, frequency, urgency, nocturia, hesitancy, discharge, hematuria or flank pain. Musculoskeletal: Denies arthralgias, myalgias, stiffness, jt. swelling, pain, limping or strain/sprain.  Skin: Denies pruritus, rash, hives, warts, acne, eczema or change in skin lesion(s). Neuro: No weakness, tremor,  incoordination, spasms, paresthesia or pain. Psychiatric: Denies confusion, memory loss or sensory loss. Endo: Denies change in weight, skin or hair change.  Heme/Lymph: No excessive bleeding, bruising or enlarged lymph nodes.  Physical Exam  BP 124/78   Pulse 64   Temp 97 F (36.1 C)   Resp 16   Ht 5' 4.5" (1.638 m)   Wt 159 lb 12.8 oz (72.5 kg)   BMI 27.01 kg/m   Appears well nourished, well groomed  and in no distress.  Eyes: PERRLA, EOMs, conjunctiva no swelling or erythema. Sinuses: No frontal/maxillary tenderness ENT/Mouth: EAC's clear, TM's nl w/o erythema, bulging. Nares clear w/o erythema, swelling, exudates. Oropharynx clear without erythema or exudates. Oral hygiene is good. Tongue normal, non obstructing. Hearing intact.  Neck: Supple. Thyroid nl. Car 2+/2+ without bruits, nodes or JVD. Chest: Respirations nl with BS clear & equal w/o rales, rhonchi, wheezing or stridor.  Cor: Heart sounds normal w/ regular rate and rhythm without sig. murmurs, gallops, clicks or rubs. Peripheral pulses normal and equal  without edema.  Abdomen: Soft & bowel sounds normal. Non-tender w/o guarding, rebound, hernias, masses or organomegaly.  Lymphatics: Unremarkable.  Musculoskeletal: Full ROM all peripheral extremities, joint stability, 5/5 strength and normal gait.  Skin: Warm, dry without exposed rashes, lesions or ecchymosis apparent.  Neuro: Cranial nerves intact, reflexes equal bilaterally. Sensory-motor testing grossly intact. Tendon reflexes grossly intact.  Pysch: Alert & oriented x 3.  Insight and judgement nl & appropriate. No ideations.  Assessment and Plan:  1. Essential hypertension  - Continue medication, monitor blood pressure at home.  - Continue DASH diet. Reminder to go to the ER if any CP,  SOB, nausea, dizziness, severe HA, changes vision/speech,  left arm numbness and tingling and jaw pain.  - CBC with Differential/Platelet - BASIC METABOLIC PANEL WITH GFR -  Magnesium - TSH  2. Hyperlipidemia  - Continue diet/meds, exercise,& lifestyle modifications.  - Continue monitor periodic cholesterol/liver & renal functions   - Hepatic function panel - Lipid panel - TSH  3. Prediabetes  - Continue diet, exercise, lifestyle modifications.  - Monitor appropriate labs.  - Hemoglobin A1c - Insulin, random  4. Vitamin D deficiency  - Continue supplementation.  - VITAMIN D 25 Hydroxy  5. Hypothyroidism - TSH  6. CKD Stage 3 (GFR 36 ml/min)  - BASIC METABOLIC PANEL WITH GFR  7. Bronchitis  - CBC with Differential/Platelet - BASIC METABOLIC PANEL WITH GFR - Hepatic function panel - Magnesium - Lipid panel - TSH - Hemoglobin A1c - Insulin, random - VITAMIN D 25 Hydroxy      Discussed  regular exercise, BP monitoring, weight control to achieve/maintain BMI less than 25 and discussed med and SE's. Recommended labs to assess and monitor clinical status with further disposition pending results of labs. Over 30 minutes of exam, counseling, chart review was performed.

## 2016-09-27 LAB — CBC WITH DIFFERENTIAL/PLATELET
Basophils Absolute: 0 cells/uL (ref 0–200)
Basophils Relative: 0 %
Eosinophils Absolute: 108 cells/uL (ref 15–500)
Eosinophils Relative: 1 %
HCT: 38.5 % (ref 35.0–45.0)
Hemoglobin: 12.6 g/dL (ref 11.7–15.5)
Lymphocytes Relative: 24 %
Lymphs Abs: 2592 cells/uL (ref 850–3900)
MCH: 30.5 pg (ref 27.0–33.0)
MCHC: 32.7 g/dL (ref 32.0–36.0)
MCV: 93.2 fL (ref 80.0–100.0)
MPV: 10.8 fL (ref 7.5–12.5)
Monocytes Absolute: 648 cells/uL (ref 200–950)
Monocytes Relative: 6 %
Neutro Abs: 7452 cells/uL (ref 1500–7800)
Neutrophils Relative %: 69 %
Platelets: 75 10*3/uL — ABNORMAL LOW (ref 140–400)
RBC: 4.13 MIL/uL (ref 3.80–5.10)
RDW: 14.3 % (ref 11.0–15.0)
WBC: 10.8 10*3/uL (ref 3.8–10.8)

## 2016-09-27 LAB — MAGNESIUM: Magnesium: 2 mg/dL (ref 1.5–2.5)

## 2016-09-27 LAB — VITAMIN D 25 HYDROXY (VIT D DEFICIENCY, FRACTURES): Vit D, 25-Hydroxy: 71 ng/mL (ref 30–100)

## 2016-09-27 LAB — HEMOGLOBIN A1C
Hgb A1c MFr Bld: 5.5 % (ref <5.7)
Mean Plasma Glucose: 111 mg/dL

## 2016-09-27 LAB — INSULIN, RANDOM: Insulin: 8.5 u[IU]/mL (ref 2.0–19.6)

## 2016-11-27 ENCOUNTER — Other Ambulatory Visit: Payer: Self-pay | Admitting: Internal Medicine

## 2016-12-21 DIAGNOSIS — H35351 Cystoid macular degeneration, right eye: Secondary | ICD-10-CM | POA: Insufficient documentation

## 2016-12-25 ENCOUNTER — Ambulatory Visit
Admission: RE | Admit: 2016-12-25 | Discharge: 2016-12-25 | Disposition: A | Discharge status: Home / Self Care | Payer: Medicare Other | Source: Ambulatory Visit | Attending: Physician Assistant | Admitting: Physician Assistant

## 2016-12-25 ENCOUNTER — Ambulatory Visit (INDEPENDENT_AMBULATORY_CARE_PROVIDER_SITE_OTHER): Payer: Medicare Other | Admitting: Physician Assistant

## 2016-12-25 ENCOUNTER — Other Ambulatory Visit: Payer: Self-pay | Admitting: Physician Assistant

## 2016-12-25 VITALS — BP 142/84 | HR 61 | Temp 97.9°F | Ht 64.5 in | Wt 160.0 lb

## 2016-12-25 DIAGNOSIS — Z79899 Other long term (current) drug therapy: Secondary | ICD-10-CM | POA: Diagnosis not present

## 2016-12-25 DIAGNOSIS — I10 Essential (primary) hypertension: Secondary | ICD-10-CM

## 2016-12-25 DIAGNOSIS — E559 Vitamin D deficiency, unspecified: Secondary | ICD-10-CM | POA: Diagnosis not present

## 2016-12-25 DIAGNOSIS — E782 Mixed hyperlipidemia: Secondary | ICD-10-CM

## 2016-12-25 DIAGNOSIS — J40 Bronchitis, not specified as acute or chronic: Secondary | ICD-10-CM | POA: Diagnosis not present

## 2016-12-25 DIAGNOSIS — R7303 Prediabetes: Secondary | ICD-10-CM

## 2016-12-25 DIAGNOSIS — R1032 Left lower quadrant pain: Secondary | ICD-10-CM

## 2016-12-25 DIAGNOSIS — E039 Hypothyroidism, unspecified: Secondary | ICD-10-CM

## 2016-12-25 DIAGNOSIS — D696 Thrombocytopenia, unspecified: Secondary | ICD-10-CM

## 2016-12-25 MED ORDER — METRONIDAZOLE 500 MG PO TABS: 500.0000 mg | ORAL_TABLET | Freq: Three times a day (TID) | ORAL | 0 refills | Status: AC

## 2016-12-25 MED ORDER — PANTOPRAZOLE SODIUM 40 MG PO TBEC: 40.0000 mg | DELAYED_RELEASE_TABLET | Freq: Every day | ORAL | 1 refills | Status: DC

## 2016-12-25 MED ORDER — AMOXICILLIN-POT CLAVULANATE 875-125 MG PO TABS: 1.0000 | ORAL_TABLET | Freq: Two times a day (BID) | ORAL | 0 refills | Status: DC

## 2016-12-25 MED ORDER — BENZONATATE 100 MG PO CAPS: 200.0000 mg | ORAL_CAPSULE | Freq: Three times a day (TID) | ORAL | 0 refills | Status: DC | PRN

## 2016-12-25 MED ORDER — PREDNISONE 20 MG PO TABS: ORAL_TABLET | ORAL | 0 refills | Status: DC

## 2016-12-25 MED ORDER — IOPAMIDOL (ISOVUE-300) INJECTION 61%
100.0000 mL | Freq: Once | INTRAVENOUS | Status: AC | PRN
  Administered 2016-12-25: 100 mL via INTRAVENOUS

## 2016-12-25 NOTE — Progress Notes (Signed)
Patient ID: Jillian Rodgers, female   DOB: 29-Oct-1945, 71 y.o.   MRN: [id number]  FOLLOW UP  Assessment:   Hypothyroidism, unspecified hypothyroidism type -cont levothyroxine - TSH  Essential hypertension - continue medications, DASH diet, exercise and monitor at home. Call if greater than 130/80.   Hyperlipidemia -cont diet and exercise -cont meds -monitor  Vitamin D deficiency -cont supplement   Medication management - CBC with Differential/Platelet - BASIC METABOLIC PANEL WITH GFR - Hepatic function panel  Thrombocytopenia ([facility]) -     CBC with Differential/Platelet - monitor  Bronchitis -     amoxicillin-clavulanate (AUGMENTIN) 875-125 MG tablet; Take 1 tablet by mouth 2 (two) times daily. -     predniSONE (DELTASONE) 20 MG tablet; 2 tablets daily for 3 days, 1 tablet daily for 4 days. -     benzonatate (TESSALON PERLES) 100 MG capsule; Take 2 capsules (200 mg total) by mouth 3 (three) times daily as needed for cough (Max: 600mg  per day). -     pantoprazole (PROTONIX) 40 MG tablet; Take 1 tablet (40 mg total) by mouth daily.  Left lower quadrant pain + rebound tenderness with chills, etc, will get CT rule out diverticulitis -     CT Abdomen Pelvis W Contrast; Future -     amoxicillin-clavulanate (AUGMENTIN) 875-125 MG tablet; Take 1 tablet by mouth 2 (two) times daily.     - bland diet, liquids, protonix.    Over 30 minutes of exam, counseling, chart review, and critical decision making was performed  Future Appointments Date Time Provider [place]  03/26/2017 9:00 AM Unk Pinto, MD [facility] None     Subjective:   Jillian Rodgers is a 71 y.o. female who presents for follow up for HTN, chol, CKD.   Her blood pressure has been controlled at home, today their BP is BP: (!) 142/84 She does workout. She denies chest pain, shortness of breath.  She has history of repeated bronchitis, leaving Wednesday/thursday for cruise up  [place], has cough with green/yellow mucus, x 1-2 weeks, no SOB/wheezing, + chills but no fever.   She has had  She had a fall august 1st fell onto left knee/right wrist, knee still hurts but no LOC/did not hit her head, has followed up with ortho and may need lap surgery on right wrist.  She is on cholesterol medication and denies myalgias. Her cholesterol is at goal. The cholesterol last visit was:   Lab Results  Component Value Date   CHOL 184 09/26/2016   HDL 82 09/26/2016   LDLCALC 68 09/26/2016   TRIG 170 (H) 09/26/2016   CHOLHDL 2.2 09/26/2016   She has been working on diet and exercise for prediabetes, and denies foot ulcerations, hyperglycemia, hypoglycemia , increased appetite, nausea, paresthesia of the feet, polydipsia, polyuria, visual disturbances, vomiting and weight loss. Last A1C in the office was:  Lab Results  Component Value Date   HGBA1C 5.5 09/26/2016   Last GFR  Lab Results  Component Value Date   GFRNONAA 53 (L) 09/26/2016   Patient is on Vitamin D supplement. Lab Results  Component Value Date   VD25OH 63 09/26/2016     She is on thyroid medication. Her medication was not changed last visit.   Lab Results  Component Value Date   TSH 2.84 09/26/2016  .  BMI is Body mass index is 27.04 kg/m., she is working on diet and exercise. Wt Readings from Last 3 Encounters:  12/25/16 160 lb (72.6  kg)  09/26/16 159 lb 12.8 oz (72.5 kg)  09/12/16 159 lb 3.2 oz (72.2 kg)     Medication Review Current Outpatient Prescriptions on File Prior to Visit  Medication Sig Dispense Refill  . Ascorbic Acid (VITAMIN C) 500 MG CAPS Take 1 capsule by mouth daily.    [name] Kitchen aspirin 81 MG tablet Take 81 mg by mouth daily.    [name] Kitchen BOTOX 100 units SOLR injection     . cetirizine (ZYRTEC) 10 MG tablet Take 10 mg by mouth daily.    . Cholecalciferol (VITAMIN D PO) Take 5,000 Units by mouth daily.    [name] Kitchen erythromycin with ethanol (THERAMYCIN) 2 % external solution Apply topically daily. 60 mL  6  . gabapentin (NEURONTIN) 600 MG tablet Take 600 mg by mouth 4 (four) times daily.     [name] Kitchen levothyroxine (SYNTHROID, LEVOTHROID) 50 MCG tablet TAKE 1 TABLET BY MOUTH EVERY MORNING ON AN EMPTY STOMACH 90 tablet 1  . OVER THE COUNTER MEDICATION Taking Tangy Tangerine tabs 2.0 # 2 tabs daily.    . pravastatin (PRAVACHOL) 40 MG tablet TAKE 1 TABLET BY MOUTH AT BEDTIME 90 tablet 0  . ranitidine (ZANTAC) 300 MG tablet TAKE ONE TABLET BY MOUTH TWICE DAILY FOR ACID INDIGESTION AND REFLUX 180 tablet 1   No current facility-administered medications on file prior to visit.     Current Problems (verified) Patient Active Problem List   Diagnosis Date Noted  . Thrombocytopenia ([place]) 11/25/2015  . Encounter for Medicare annual wellness exam 06/14/2015  . BMI 26.71,  adult 01/28/2015  . CKD Stage 3 (GFR 36 ml/min) 01/19/2014  . Upper airway cough syndrome 12/16/2013  . Vitamin D deficiency 05/21/2013  . Prediabetes 05/21/2013  . Medication management 05/21/2013  . Essential hypertension 01/31/2013  . Degenerative joint disease   . GERD (gastroesophageal reflux disease)   . Hyperlipidemia   . Hypothyroidism     Allergies No Known Allergies  Surgical History: reviewed and unchanged Family History: reviewed and unchanged Social History: reviewed and unchanged  Review of Systems  Constitutional: Positive for chills and malaise/fatigue. Negative for diaphoresis, fever and weight loss.  HENT: Positive for congestion. Negative for ear discharge, ear pain, hearing loss, nosebleeds, sinus pain, sore throat and tinnitus.   Respiratory: Positive for cough. Negative for shortness of breath, wheezing and stridor.   Cardiovascular: Negative.  Negative for chest pain and claudication.  Gastrointestinal: Positive for abdominal pain, constipation, diarrhea and heartburn. Negative for blood in stool, melena, nausea and vomiting.  Genitourinary: Negative.   Musculoskeletal: Positive for falls and joint pain  (knees and wrist, seeing ortho, doing better).  Skin: Negative.  Negative for rash.  Neurological: Negative for dizziness, tingling, tremors, sensory change, speech change, focal weakness, seizures, loss of consciousness and weakness.  Psychiatric/Behavioral: Negative.      Objective:   Today's Vitals   12/25/16 1515  BP: (!) 142/84  Pulse: 61  Temp: 97.9 F (36.6 C)  SpO2: 97%  Weight: 160 lb (72.6 kg)  Height: 5' 4.5" (1.638 m)   Body mass index is 27.04 kg/m.  General appearance: alert, no distress, WD/WN,  female HEENT: normocephalic, sclerae anicteric, TMs pearly, nares patent, no discharge or erythema, pharynx normal Oral cavity: MMM, no lesions Neck: supple, no lymphadenopathy, no thyromegaly, no masses Heart: RRR, normal S1, S2, no murmurs Lungs: CTA bilaterally, no wheezes, rhonchi, or rales Abdomen: +bs, soft, + tender epigastric and LLQ with rebound tenderness, non distended, no masses, no hepatomegaly, no splenomegaly Musculoskeletal:  nontender, no swelling, no obvious deformity Extremities: no edema, no cyanosis, no clubbing Pulses: 2+ symmetric, upper and lower extremities, normal cap refill Neurological: alert, oriented x 3, CN2-12 intact, strength normal upper extremities and lower extremities, sensation normal throughout, DTRs 2+ throughout, no cerebellar signs, gait normal Psychiatric: normal affect, behavior normal, pleasant    Vicie Mutters, PA-C   12/25/2016

## 2016-12-25 NOTE — Patient Instructions (Signed)
Protonix take 30 mins before food in AM for at least 2 weeks, can take zantac with it Then go back on zantac  Take augmentin for sinuses and AB pain   If any worsening AB pain, vomiting, unable to have BM x 2 days, fever, weakness, Shortness of breath go to ER  We are getting a CT of your AB  Do bland diet, avoid fiber for now   Diverticulitis Diverticulitis is inflammation or infection of small pouches in your colon that form when you have a condition called diverticulosis. The pouches in your colon are called diverticula. Your colon, or large intestine, is where water is absorbed and stool is formed. Complications of diverticulitis can include:  Bleeding.  Severe infection.  Severe pain.  Perforation of your colon.  Obstruction of your colon.  What are the causes? Diverticulitis is caused by bacteria. Diverticulitis happens when stool becomes trapped in diverticula. This allows bacteria to grow in the diverticula, which can lead to inflammation and infection. What increases the risk? People with diverticulosis are at risk for diverticulitis. Eating a diet that does not include enough fiber from fruits and vegetables may make diverticulitis more likely to develop. What are the signs or symptoms? Symptoms of diverticulitis may include:  Abdominal pain and tenderness. The pain is normally located on the left side of the abdomen, but may occur in other areas.  Fever and chills.  Bloating.  Cramping.  Nausea.  Vomiting.  Constipation.  Diarrhea.  Blood in your stool.  How is this diagnosed? Your health care provider will ask you about your medical history and do a physical exam. You may need to have tests done because many medical conditions can cause the same symptoms as diverticulitis. Tests may include:  Blood tests.  Urine tests.  Imaging tests of the abdomen, including X-rays and CT scans.  When your condition is under control, your health care provider  may recommend that you have a colonoscopy. A colonoscopy can show how severe your diverticula are and whether something else is causing your symptoms. How is this treated? Most cases of diverticulitis are mild and can be treated at home. Treatment may include:  Taking over-the-counter pain medicines.  Following a clear liquid diet.  Taking antibiotic medicines by mouth for 7-10 days.  More severe cases may be treated at a hospital. Treatment may include:  Not eating or drinking.  Taking prescription pain medicine.  Receiving antibiotic medicines through an IV tube.  Receiving fluids and nutrition through an IV tube.  Surgery.  Follow these instructions at home:  Follow your health care provider's instructions carefully.  Follow a full liquid diet or other diet as directed by your health care provider. After your symptoms improve, your health care provider may tell you to change your diet. He or she may recommend you eat a high-fiber diet. Fruits and vegetables are good sources of fiber. Fiber makes it easier to pass stool.  Take fiber supplements or probiotics as directed by your health care provider.  Only take medicines as directed by your health care provider.  Keep all your follow-up appointments. Contact a health care provider if:  Your pain does not improve.  You have a hard time eating food.  Your bowel movements do not return to normal. Get help right away if:  Your pain becomes worse.  Your symptoms do not get better.  Your symptoms suddenly get worse.  You have a fever.  You have repeated vomiting.  You have  bloody or black, tarry stools. This information is not intended to replace advice given to you by your health care provider. Make sure you discuss any questions you have with your health care provider. Document Released: 01/11/2005 Document Revised: 09/09/2015 Document Reviewed: 02/26/2013 Elsevier Interactive Patient Education  2017 [employer].

## 2016-12-26 ENCOUNTER — Encounter: Payer: Self-pay | Admitting: Internal Medicine

## 2016-12-26 LAB — CBC WITH DIFFERENTIAL/PLATELET
Basophils Absolute: 30 cells/uL (ref 0–200)
Basophils Relative: 0.4 %
Eosinophils Absolute: 120 cells/uL (ref 15–500)
Eosinophils Relative: 1.6 %
HCT: 38.1 % (ref 35.0–45.0)
Hemoglobin: 12.9 g/dL (ref 11.7–15.5)
Lymphs Abs: 2310 cells/uL (ref 850–3900)
MCH: 30.1 pg (ref 27.0–33.0)
MCHC: 33.9 g/dL (ref 32.0–36.0)
MCV: 88.8 fL (ref 80.0–100.0)
Monocytes Relative: 9.6 %
Neutro Abs: 4320 cells/uL (ref 1500–7800)
Neutrophils Relative %: 57.6 %
RBC: 4.29 10*6/uL (ref 3.80–5.10)
RDW: 12 % (ref 11.0–15.0)
Total Lymphocyte: 30.8 %
WBC mixed population: 720 cells/uL (ref 200–950)
WBC: 7.5 10*3/uL (ref 3.8–10.8)

## 2016-12-26 LAB — BASIC METABOLIC PANEL WITH GFR
BUN/Creatinine Ratio: 24 (calc) — ABNORMAL HIGH (ref 6–22)
BUN: 30 mg/dL — ABNORMAL HIGH (ref 7–25)
CO2: 29 mmol/L (ref 20–32)
Calcium: 10.3 mg/dL (ref 8.6–10.4)
Chloride: 104 mmol/L (ref 98–110)
Creat: 1.23 mg/dL — ABNORMAL HIGH (ref 0.60–0.93)
GFR, Est African American: 51 mL/min/{1.73_m2} — ABNORMAL LOW (ref >=60)
GFR, Est Non African American: 44 mL/min/{1.73_m2} — ABNORMAL LOW (ref >=60)
Glucose, Bld: 79 mg/dL (ref 65–99)
Potassium: 4.1 mmol/L (ref 3.5–5.3)
Sodium: 139 mmol/L (ref 135–146)

## 2016-12-26 LAB — LIPID PANEL
Cholesterol: 152 mg/dL (ref <200)
HDL: 64 mg/dL (ref >50)
LDL Cholesterol (Calc): 63 mg/dL (calc)
Non-HDL Cholesterol (Calc): 88 mg/dL (calc) (ref <130)
Total CHOL/HDL Ratio: 2.4 (calc) (ref <5.0)
Triglycerides: 168 mg/dL — ABNORMAL HIGH (ref <150)

## 2016-12-26 LAB — HEPATIC FUNCTION PANEL
AG Ratio: 1.8 (calc) (ref 1.0–2.5)
ALT: 13 U/L (ref 6–29)
AST: 17 U/L (ref 10–35)
Albumin: 4.1 g/dL (ref 3.6–5.1)
Alkaline phosphatase (APISO): 80 U/L (ref 33–130)
Bilirubin, Direct: 0.1 mg/dL (ref 0.0–0.2)
Globulin: 2.3 g/dL (calc) (ref 1.9–3.7)
Indirect Bilirubin: 0.4 mg/dL (calc) (ref 0.2–1.2)
Total Bilirubin: 0.5 mg/dL (ref 0.2–1.2)
Total Protein: 6.4 g/dL (ref 6.1–8.1)

## 2016-12-26 LAB — TSH: TSH: 2.63 mIU/L (ref 0.40–4.50)

## 2016-12-26 LAB — MAGNESIUM: Magnesium: 2.3 mg/dL (ref 1.5–2.5)

## 2016-12-26 NOTE — Progress Notes (Signed)
LVM for pt to return office call for LAB results.

## 2016-12-26 NOTE — Progress Notes (Signed)
Pt aware of lab results & voiced understanding of those results. Pt transferred to front to schedule 1 mth nv.

## 2016-12-27 ENCOUNTER — Ambulatory Visit: Payer: Self-pay | Admitting: Physician Assistant

## 2017-01-23 ENCOUNTER — Ambulatory Visit (INDEPENDENT_AMBULATORY_CARE_PROVIDER_SITE_OTHER): Payer: Medicare Other | Admitting: Adult Health

## 2017-01-23 ENCOUNTER — Encounter: Payer: Self-pay | Admitting: Adult Health

## 2017-01-23 VITALS — BP 126/80 | HR 72 | Temp 97.9°F | Resp 18 | Ht 64.5 in | Wt 152.8 lb

## 2017-01-23 DIAGNOSIS — R110 Nausea: Secondary | ICD-10-CM

## 2017-01-23 DIAGNOSIS — R1084 Generalized abdominal pain: Secondary | ICD-10-CM | POA: Diagnosis not present

## 2017-01-23 DIAGNOSIS — N189 Chronic kidney disease, unspecified: Secondary | ICD-10-CM | POA: Diagnosis not present

## 2017-01-23 DIAGNOSIS — R197 Diarrhea, unspecified: Secondary | ICD-10-CM

## 2017-01-23 LAB — BASIC METABOLIC PANEL WITH GFR
BUN/Creatinine Ratio: 22 (calc) (ref 6–22)
BUN: 23 mg/dL (ref 7–25)
CO2: 25 mmol/L (ref 20–32)
Calcium: 9.5 mg/dL (ref 8.6–10.4)
Chloride: 101 mmol/L (ref 98–110)
Creat: 1.04 mg/dL — ABNORMAL HIGH (ref 0.60–0.93)
GFR, Est African American: 62 mL/min/{1.73_m2} (ref >=60)
GFR, Est Non African American: 54 mL/min/{1.73_m2} — ABNORMAL LOW (ref >=60)
Glucose, Bld: 84 mg/dL (ref 65–99)
Potassium: 3.6 mmol/L (ref 3.5–5.3)
Sodium: 138 mmol/L (ref 135–146)

## 2017-01-23 LAB — CBC WITH DIFFERENTIAL/PLATELET
Basophils Absolute: 16 cells/uL (ref 0–200)
Basophils Relative: 0.1 %
Eosinophils Absolute: 16 cells/uL (ref 15–500)
Eosinophils Relative: 0.1 %
HCT: 34.6 % — ABNORMAL LOW (ref 35.0–45.0)
Hemoglobin: 11.9 g/dL (ref 11.7–15.5)
Lymphs Abs: 1033 cells/uL (ref 850–3900)
MCH: 29.9 pg (ref 27.0–33.0)
MCHC: 34.4 g/dL (ref 32.0–36.0)
MCV: 86.9 fL (ref 80.0–100.0)
MPV: 11.1 fL (ref 7.5–12.5)
Monocytes Relative: 4.8 %
Neutro Abs: 14547 cells/uL — ABNORMAL HIGH (ref 1500–7800)
Neutrophils Relative %: 88.7 %
Platelets: 83 10*3/uL — ABNORMAL LOW (ref 140–400)
RBC: 3.98 10*6/uL (ref 3.80–5.10)
RDW: 11.9 % (ref 11.0–15.0)
Total Lymphocyte: 6.3 %
WBC mixed population: 787 cells/uL (ref 200–950)
WBC: 16.4 10*3/uL — ABNORMAL HIGH (ref 3.8–10.8)

## 2017-01-23 LAB — HEPATIC FUNCTION PANEL
AG Ratio: 1.3 (calc) (ref 1.0–2.5)
ALT: 19 U/L (ref 6–29)
AST: 22 U/L (ref 10–35)
Albumin: 3.7 g/dL (ref 3.6–5.1)
Alkaline phosphatase (APISO): 76 U/L (ref 33–130)
Bilirubin, Direct: 0.2 mg/dL (ref 0.0–0.2)
Globulin: 2.8 g/dL (calc) (ref 1.9–3.7)
Indirect Bilirubin: 0.7 mg/dL (calc) (ref 0.2–1.2)
Total Bilirubin: 0.9 mg/dL (ref 0.2–1.2)
Total Protein: 6.5 g/dL (ref 6.1–8.1)

## 2017-01-23 LAB — LIPASE: Lipase: 9 U/L (ref 7–60)

## 2017-01-23 LAB — AMYLASE: Amylase: 20 U/L — ABNORMAL LOW (ref 21–101)

## 2017-01-23 MED ORDER — CIPROFLOXACIN HCL 500 MG PO TABS: 500.0000 mg | ORAL_TABLET | Freq: Two times a day (BID) | ORAL | 0 refills | Status: AC

## 2017-01-23 MED ORDER — METRONIDAZOLE 500 MG PO TABS: 500.0000 mg | ORAL_TABLET | Freq: Three times a day (TID) | ORAL | 0 refills | Status: AC

## 2017-01-23 MED ORDER — ONDANSETRON HCL 4 MG PO TABS: 4.0000 mg | ORAL_TABLET | Freq: Three times a day (TID) | ORAL | 1 refills | Status: DC | PRN

## 2017-01-23 NOTE — Patient Instructions (Addendum)
Start taking imodium today; drink broths and gatorade to help hydrate.   I am sending in zofran for your nausea.  I am giving you some antibiotic prescriptions to take if not improving in a day or two despite hydration/imodium, and also pending lab results.   Please go to the ER if you have any severe AB pain, unable to hold down food/water, blood in stool or vomit, chest pain, shortness of breath, or any worsening symptoms.    Diarrhea, Adult Diarrhea is frequent loose and watery bowel movements. Diarrhea can make you feel weak and cause you to become dehydrated. Dehydration can make you tired and thirsty, cause you to have a dry mouth, and decrease how often you urinate. Diarrhea typically lasts 2-3 days. However, it can last longer if it is a sign of something more serious. It is important to treat your diarrhea as told by your health care provider. Follow these instructions at home: Eating and drinking  Follow these recommendations as told by your health care provider:  Take an oral rehydration solution (ORS). This is a drink that is sold at pharmacies and retail stores.  Drink clear fluids, such as water, ice chips, diluted fruit juice, and low-calorie sports drinks.  Eat bland, easy-to-digest foods in small amounts as you are able. These foods include bananas, applesauce, rice, lean meats, toast, and crackers.  Avoid drinking fluids that contain a lot of sugar or caffeine, such as energy drinks, sports drinks, and soda.  Avoid alcohol.  Avoid spicy or fatty foods.  General instructions  Drink enough fluid to keep your urine clear or pale yellow.  Wash your hands often. If soap and water are not available, use hand sanitizer.  Make sure that all people in your household wash their hands well and often.  Take over-the-counter and prescription medicines only as told by your health care provider.  Rest at home while you recover.  Watch your condition for any changes.  Take  a warm bath to relieve any burning or pain from frequent diarrhea episodes.  Keep all follow-up visits as told by your health care provider. This is important. Contact a health care provider if:  You have a fever.  Your diarrhea gets worse.  You have new symptoms.  You cannot keep fluids down.  You feel light-headed or dizzy.  You have a headache  You have muscle cramps. Get help right away if:  You have chest pain.  You feel extremely weak or you faint.  You have bloody or black stools or stools that look like tar.  You have severe pain, cramping, or bloating in your abdomen.  You have trouble breathing or you are breathing very quickly.  Your heart is beating very quickly.  Your skin feels cold and clammy.  You feel confused.  You have signs of dehydration, such as: ? Dark urine, very little urine, or no urine. ? Cracked lips. ? Dry mouth. ? Sunken eyes. ? Sleepiness. ? Weakness. This information is not intended to replace advice given to you by your health care provider. Make sure you discuss any questions you have with your health care provider. Document Released: 03/24/2002 Document Revised: 08/12/2015 Document Reviewed: 12/08/2014 Elsevier Interactive Patient Education  2017 [employer].

## 2017-01-23 NOTE — Progress Notes (Signed)
Assessment and Plan:  Jillian Rodgers was seen today for nausea, cough and heart fluttering.  Diagnoses and all orders for this visit:  Diarrhea of presumed infectious origin -     ciprofloxacin (CIPRO) 500 MG tablet; Take 1 tablet (500 mg total) by mouth 2 (two) times daily. -     metroNIDAZOLE (FLAGYL) 500 MG tablet; Take 1 tablet (500 mg total) by mouth 3 (three) times daily.  Generalized abdominal pain -     CBC with Differential/Platelet -     BASIC METABOLIC PANEL WITH GFR -     Hepatic function panel -     Amylase -     Lipase  Chronic kidney disease,  CKD stage 3 -     BASIC METABOLIC PANEL WITH GFR  Nausea -     ondansetron (ZOFRAN) 4 MG tablet; Take 1 tablet (4 mg total) by mouth every 8 (eight) hours as needed for nausea or vomiting.  Anticipate patient is somewhat dehydrated- instructions given for hydration; will follow up on labs (notably renal function that was of concern at last visit). Antibiotics printed and given to patient but advised to postpone taking for a few days to see if symptoms improve with supportive treatment with a probiotic over the next 24-48 hours. No symptoms suggestive of perforation/obstruction needing imaging at this time. ER precautions given.   Further disposition pending results of labs. Discussed med's effects and SE's.   Over 30 minutes of exam, counseling, chart review, and critical decision making was performed.   Future Appointments Date Time Provider Roland  03/26/2017 9:00 AM Unk Pinto, MD [facility] None    ------------------------------------------------------------------------------------------------------------------   HPI BP 126/80   Pulse 72   Temp 97.9 F (36.6 C)   Resp 18   Ht 5' 4.5" (1.638 m)   Wt 152 lb 12.8 oz (69.3 kg)   BMI 25.82 kg/m   71 y.o.female presents for nausea, diarrhea, feeling feverish x 4 days. She reports she is having many "small" episodes of diarrhea/soft stool ~5x per day. She  denies a notable foul odor to BMs, or any explosive diarrhea. Denies vomiting. Does endorse generalized mild abdominal pain, somewhat worse in RUQ. She did try taking imodium at one time which did work to reduce the diarrhea. She is also reporting fatigue/malaise, reports she slept all day yesterday. She endorses a reduced appetite, but is keeping down fluids/food.   She and her husband went on a cruise a few weeks ago; no other travel. No known sick contacts. Husband at home without symptoms. She endorses a brief episode of SOB this morning, but used an inhaler and symptoms resolved quickly. She denies any CP/SOB/dizziness since.   Abdominal CT taken 12/25/2016 for c/o LLQ pain showed a mild acute sigmoid diverticulitis with no evidence of perforation or complication. Was treated with augmentin and felt well for 1-2 weeks prior to this episode.   1. Findings consistent with mild acute sigmoid diverticulitis. No evidence for perforation or other complication. 2. Left inguinal hernia containing a short segment of distal colon without associated obstruction or inflammation. 3. Innumerable renal cystic lesions, some of which demonstrate internal complexity. While these most likely reflect cysts and/or complex cyst, these are incompletely evaluated on this examination. Follow-up examination with dedicated renal mass protocol MRI suggested for full characterization if not already performed. 4. Moderate hiatal hernia, with additional small fat containing right inguinal hernia.  She has discussed renal cysts with Dr. Corliss Parish, who has done follow up imaging  x2 to track growth, concluded they are stable. She has a follow up within a year.   Past Medical History:  Diagnosis Date  . Degenerative joint disease   . Diabetes mellitus without complication ([place])   . GERD (gastroesophageal reflux disease)   . Hyperlipidemia   . Hypertension   . Migraines    neurontin helps  . Pre-diabetes   .  Thyroid disease      No Known Allergies  Current Outpatient Prescriptions on File Prior to Visit  Medication Sig  . amoxicillin-clavulanate (AUGMENTIN) 875-125 MG tablet Take 1 tablet by mouth 2 (two) times daily.  . Ascorbic Acid (VITAMIN C) 500 MG CAPS Take 1 capsule by mouth daily.  [name] Kitchen aspirin 81 MG tablet Take 81 mg by mouth daily.  . benzonatate (TESSALON PERLES) 100 MG capsule Take 2 capsules (200 mg total) by mouth 3 (three) times daily as needed for cough (Max: 600mg  per day).  . BOTOX 100 units SOLR injection   . cetirizine (ZYRTEC) 10 MG tablet Take 10 mg by mouth daily.  . Cholecalciferol (VITAMIN D PO) Take 5,000 Units by mouth daily.  [name] Kitchen erythromycin with ethanol (THERAMYCIN) 2 % external solution Apply topically daily.  [name] Kitchen gabapentin (NEURONTIN) 600 MG tablet Take 600 mg by mouth 4 (four) times daily.   [name] Kitchen levothyroxine (SYNTHROID, LEVOTHROID) 50 MCG tablet TAKE 1 TABLET BY MOUTH EVERY MORNING ON AN EMPTY STOMACH  . OVER THE COUNTER MEDICATION Taking Tangy Tangerine tabs 2.0 # 2 tabs daily.  . pantoprazole (PROTONIX) 40 MG tablet Take 1 tablet (40 mg total) by mouth daily.  . pravastatin (PRAVACHOL) 40 MG tablet TAKE 1 TABLET BY MOUTH AT BEDTIME  . predniSONE (DELTASONE) 20 MG tablet 2 tablets daily for 3 days, 1 tablet daily for 4 days.  . ranitidine (ZANTAC) 300 MG tablet TAKE ONE TABLET BY MOUTH TWICE DAILY FOR ACID INDIGESTION AND REFLUX   No current facility-administered medications on file prior to visit.     ROS: Review of Systems  Constitutional: Positive for malaise/fatigue. Negative for chills, diaphoresis and fever.  HENT: Negative for congestion, hearing loss and sore throat.   Eyes: Negative for blurred vision.  Respiratory: Negative for cough, sputum production, shortness of breath and wheezing.   Cardiovascular: Negative for chest pain, palpitations, orthopnea and leg swelling.  Gastrointestinal: Positive for abdominal pain, diarrhea and nausea. Negative for  blood in stool, constipation, heartburn, melena and vomiting.  Genitourinary: Negative.  Negative for dysuria, frequency, hematuria and urgency.  Musculoskeletal: Negative for joint pain and myalgias.  Skin: Negative.   Neurological: Positive for weakness. Negative for dizziness, sensory change and headaches.  Endo/Heme/Allergies: Negative.   Psychiatric/Behavioral: Negative.     Physical Exam:  BP 126/80   Pulse 72   Temp 97.9 F (36.6 C)   Resp 18   Ht 5' 4.5" (1.638 m)   Wt 152 lb 12.8 oz (69.3 kg)   BMI 25.82 kg/m   General Appearance: Well nourished, in no apparent distress. Eyes: PERRLA, EOMs, conjunctiva no swelling or erythema Sinuses: No Frontal/maxillary tenderness ENT/Mouth: Ext aud canals clear, TMs without erythema, bulging. No erythema, swelling, or exudate on post pharynx.  Tonsils not swollen or erythematous. Hearing normal.  Neck: Supple, thyroid normal.  Respiratory: Respiratory effort normal, BS equal bilaterally without rales, rhonchi, wheezing or stridor.  Cardio: RRR with no MRGs. Brisk peripheral pulses without edema.  Abdomen: Soft, + BS all quadrants.  Not tender to light palpation, generalized tenderness to deep palpation somewhat  worse in RUQ.  No guarding, rebound, palpable hernias, masses.  Lymphatics: Non tender without lymphadenopathy.  Musculoskeletal: Full ROM, 5/5 strength, normal gait.  Skin: Warm, dry without rashes, lesions, ecchymosis.  Neuro: Cranial nerves intact. Normal muscle tone, no cerebellar symptoms. Sensation intact.  Psych: Awake and oriented X 3, normal affect, Insight and Judgment appropriate.     Izora Ribas, NP 2:22 PM [facility]

## 2017-01-24 ENCOUNTER — Ambulatory Visit: Payer: Self-pay

## 2017-01-24 NOTE — Progress Notes (Signed)
Pt aware of lab results & voiced understanding of those results.

## 2017-01-29 ENCOUNTER — Encounter: Payer: Self-pay | Admitting: Adult Health

## 2017-01-29 ENCOUNTER — Ambulatory Visit (INDEPENDENT_AMBULATORY_CARE_PROVIDER_SITE_OTHER): Payer: Medicare Other | Admitting: Adult Health

## 2017-01-29 VITALS — BP 122/82 | HR 71 | Temp 97.3°F | Resp 16

## 2017-01-29 DIAGNOSIS — K219 Gastro-esophageal reflux disease without esophagitis: Secondary | ICD-10-CM | POA: Diagnosis not present

## 2017-01-29 DIAGNOSIS — J40 Bronchitis, not specified as acute or chronic: Secondary | ICD-10-CM

## 2017-01-29 DIAGNOSIS — R197 Diarrhea, unspecified: Secondary | ICD-10-CM

## 2017-01-29 MED ORDER — PANTOPRAZOLE SODIUM 40 MG PO TBEC: 40.0000 mg | DELAYED_RELEASE_TABLET | Freq: Every day | ORAL | 1 refills | Status: DC

## 2017-01-29 NOTE — Progress Notes (Signed)
Assessment and Plan:  Diagnoses and all orders for this visit:  Diarrhea of presumed infectious origin -     Gastrointestinal Pathogen Panel PCR; Future  GERD      -      Protonix 40 mg daily refilled, instructed to take at night, take ranitidine 300 mg in AM      -      If this continues, will send to GI  Ongoing diarrhea presumed to be recurrent from previous episode despite cipro/flagyl and probiotic. Will obtain stool sample to analyze. Viberzi sample for 8 days given to try instead of imodium which has not significantly helped in the last few days. Discussed possible imaging/GI referral pending results.   Further disposition pending results of labs. Discussed med's effects and SE's.   Over 15 minutes of exam, counseling, chart review, and critical decision making was performed.   Future Appointments Date Time Provider Wallace  03/26/2017 9:00 AM Unk Pinto, MD [facility] None    ------------------------------------------------------------------------------------------------------------------   HPI 71 y.o.female presents for continued diarrhea- she was seen at this office 01/23/2017 and prescribed cipro/flagyl for watery diarrhea x4 days. She reports the diarrhea subsided for 3 days or so after starting the antibiotic/probiotic before starting back up on the 13th while still an anbitiotics (last dose taken today). She has kept a log of diarrhea which shows 4 episodes of watery diarrhea daily. She has been taking imodium throughout. She denies fever/chills, does endorse continued mild general R sided discomfort. She denies mucus/blood in diarrhea, dark stools, N/V, dizziness/weakness. She does endorse breakthrough GERD at night- she is taking protonix 40 mg in the AM, ranitidine 300 mg inhibitor at night.   Renal function from last visit demonstrated improved function from previous     Component Value Date/Time   NA 138 01/23/2017 1456   K 3.6 01/23/2017 1456   CL  101 01/23/2017 1456   CO2 25 01/23/2017 1456   GLUCOSE 84 01/23/2017 1456   BUN 23 01/23/2017 1456   CREATININE 1.04 (H) 01/23/2017 1456   CALCIUM 9.5 01/23/2017 1456   PROT 6.5 01/23/2017 1456   ALBUMIN 3.7 09/26/2016 1147   AST 22 01/23/2017 1456   ALT 19 01/23/2017 1456   ALKPHOS 62 09/26/2016 1147   BILITOT 0.9 01/23/2017 1456   GFRNONAA 54 (L) 01/23/2017 1456   GFRAA 62 01/23/2017 1456     Past Medical History:  Diagnosis Date  . Degenerative joint disease   . Diabetes mellitus without complication ([place])   . GERD (gastroesophageal reflux disease)   . Hyperlipidemia   . Hypertension   . Migraines    neurontin helps  . Pre-diabetes   . Thyroid disease      No Known Allergies  Current Outpatient Prescriptions on File Prior to Visit  Medication Sig  . Ascorbic Acid (VITAMIN C) 500 MG CAPS Take 1 capsule by mouth daily.  [name] Kitchen aspirin 81 MG tablet Take 81 mg by mouth daily.  . benzonatate (TESSALON PERLES) 100 MG capsule Take 2 capsules (200 mg total) by mouth 3 (three) times daily as needed for cough (Max: 600mg  per day).  . BOTOX 100 units SOLR injection   . cetirizine (ZYRTEC) 10 MG tablet Take 10 mg by mouth daily.  . Cholecalciferol (VITAMIN D PO) Take 5,000 Units by mouth daily.  [name] Kitchen erythromycin with ethanol (THERAMYCIN) 2 % external solution Apply topically daily.  [name] Kitchen gabapentin (NEURONTIN) 600 MG tablet Take 600 mg by mouth 4 (four) times daily.   [name] Kitchen  levothyroxine (SYNTHROID, LEVOTHROID) 50 MCG tablet TAKE 1 TABLET BY MOUTH EVERY MORNING ON AN EMPTY STOMACH  . ondansetron (ZOFRAN) 4 MG tablet Take 1 tablet (4 mg total) by mouth every 8 (eight) hours as needed for nausea or vomiting.  [name] Kitchen OVER THE COUNTER MEDICATION Taking Tangy Tangerine tabs 2.0 # 2 tabs daily.  . pravastatin (PRAVACHOL) 40 MG tablet TAKE 1 TABLET BY MOUTH AT BEDTIME  . ranitidine (ZANTAC) 300 MG tablet TAKE ONE TABLET BY MOUTH TWICE DAILY FOR ACID INDIGESTION AND REFLUX   No current  facility-administered medications on file prior to visit.     ROS: all negative except above.   Physical Exam:  BP 122/82   Pulse 71   Temp (!) 97.3 F (36.3 C)   Resp 16   SpO2 99%   General Appearance: Well nourished, in no apparent distress. ENT/Mouth: Ext aud canals clear, TMs without erythema, bulging. No erythema, swelling, or exudate on post pharynx.  Tonsils not swollen or erythematous. Hearing normal. MM pink, moist.  Neck: Supple, thyroid normal.  Respiratory: Respiratory effort normal, BS equal bilaterally without rales, rhonchi, wheezing or stridor.  Cardio: RRR with no MRGs. Brisk peripheral pulses without edema.  Abdomen: Soft, + BS.  Non tender, no guarding, rebound, hernias, masses. Lymphatics: Non tender without lymphadenopathy.  Musculoskeletal: Full ROM, 5/5 strength, normal gait.  Skin: Warm, dry without rashes, lesions, ecchymosis.  Neuro: Cranial nerves intact. Normal muscle tone, no cerebellar symptoms. Sensation intact.  Psych: Awake and oriented X 3, normal affect, Insight and Judgment appropriate.     Izora Ribas, NP 4:29 PM [facility]

## 2017-01-29 NOTE — Patient Instructions (Addendum)
Bring back stool sample; providing viberzi sample to try for 1 week. Pending results of stool culture, we may order another antibiotic, a scan, or send you to GI.   Continue pushing fluids, including gatorade or protein broths.    Diarrhea, Adult Diarrhea is frequent loose and watery bowel movements. Diarrhea can make you feel weak and cause you to become dehydrated. Dehydration can make you tired and thirsty, cause you to have a dry mouth, and decrease how often you urinate. Diarrhea typically lasts 2-3 days. However, it can last longer if it is a sign of something more serious. It is important to treat your diarrhea as told by your health care provider. Follow these instructions at home: Eating and drinking  Follow these recommendations as told by your health care provider:  Take an oral rehydration solution (ORS). This is a drink that is sold at pharmacies and retail stores.  Drink clear fluids, such as water, ice chips, diluted fruit juice, and low-calorie sports drinks.  Eat bland, easy-to-digest foods in small amounts as you are able. These foods include bananas, applesauce, rice, lean meats, toast, and crackers.  Avoid drinking fluids that contain a lot of sugar or caffeine, such as energy drinks, sports drinks, and soda.  Avoid alcohol.  Avoid spicy or fatty foods.  General instructions  Drink enough fluid to keep your urine clear or pale yellow.  Wash your hands often. If soap and water are not available, use hand sanitizer.  Make sure that all people in your household wash their hands well and often.  Take over-the-counter and prescription medicines only as told by your health care provider.  Rest at home while you recover.  Watch your condition for any changes.  Take a warm bath to relieve any burning or pain from frequent diarrhea episodes.  Keep all follow-up visits as told by your health care provider. This is important. Contact a health care provider if:  You  have a fever.  Your diarrhea gets worse.  You have new symptoms.  You cannot keep fluids down.  You feel light-headed or dizzy.  You have a headache  You have muscle cramps. Get help right away if:  You have chest pain.  You feel extremely weak or you faint.  You have bloody or black stools or stools that look like tar.  You have severe pain, cramping, or bloating in your abdomen.  You have trouble breathing or you are breathing very quickly.  Your heart is beating very quickly.  Your skin feels cold and clammy.  You feel confused.  You have signs of dehydration, such as: ? Dark urine, very little urine, or no urine. ? Cracked lips. ? Dry mouth. ? Sunken eyes. ? Sleepiness. ? Weakness. This information is not intended to replace advice given to you by your health care provider. Make sure you discuss any questions you have with your health care provider. Document Released: 03/24/2002 Document Revised: 08/12/2015 Document Reviewed: 12/08/2014 Elsevier Interactive Patient Education  2017 [employer].

## 2017-01-31 ENCOUNTER — Other Ambulatory Visit: Payer: Self-pay | Admitting: Adult Health

## 2017-01-31 ENCOUNTER — Other Ambulatory Visit: Payer: Self-pay

## 2017-01-31 DIAGNOSIS — R197 Diarrhea, unspecified: Secondary | ICD-10-CM

## 2017-01-31 NOTE — Addendum Note (Signed)
Addended by: Izora Ribas on: 01/31/2017 02:38 PM   Modules accepted: Orders

## 2017-02-05 ENCOUNTER — Telehealth: Payer: Self-pay

## 2017-02-05 ENCOUNTER — Encounter: Payer: Self-pay | Admitting: Adult Health

## 2017-02-05 ENCOUNTER — Other Ambulatory Visit: Payer: Self-pay | Admitting: Internal Medicine

## 2017-02-05 LAB — GASTROINTESTINAL PATHOGEN PANEL PCR
C. difficile Tox A/B, PCR: NOT DETECTED
Campylobacter, PCR: NOT DETECTED
Cryptosporidium, PCR: NOT DETECTED
E coli (ETEC) LT/ST PCR: NOT DETECTED
E coli (STEC) stx1/stx2, PCR: NOT DETECTED
E coli 0157, PCR: NOT DETECTED
Giardia lamblia, PCR: NOT DETECTED
Norovirus, PCR: NOT DETECTED
Rotavirus A, PCR: NOT DETECTED
Salmonella, PCR: NOT DETECTED
Shigella, PCR: NOT DETECTED

## 2017-02-05 NOTE — Telephone Encounter (Signed)
Patient called regarding lab results. Per Caryl Pina, results not available yet and lab had sent them out late. Patient aware of this and is quite concerned that she may be contagious. She is still not feeling well. Pt advised that she will have a referral done to see a GI physician and will call her as soon as an appointment is made.

## 2017-02-05 NOTE — Addendum Note (Signed)
Addended by: Izora Ribas on: 02/05/2017 01:37 PM   Modules accepted: Orders

## 2017-02-06 LAB — HM MAMMOGRAPHY: HM Mammogram: NORMAL (ref 0–4)

## 2017-02-28 ENCOUNTER — Ambulatory Visit (INDEPENDENT_AMBULATORY_CARE_PROVIDER_SITE_OTHER): Payer: Medicare Other

## 2017-02-28 DIAGNOSIS — Z23 Encounter for immunization: Secondary | ICD-10-CM

## 2017-03-01 ENCOUNTER — Other Ambulatory Visit: Payer: Self-pay | Admitting: Physician Assistant

## 2017-03-01 ENCOUNTER — Encounter: Payer: Self-pay | Admitting: Physician Assistant

## 2017-03-01 ENCOUNTER — Ambulatory Visit: Payer: Medicare Other | Admitting: Physician Assistant

## 2017-03-01 ENCOUNTER — Ambulatory Visit (HOSPITAL_COMMUNITY)
Admission: RE | Admit: 2017-03-01 | Discharge: 2017-03-01 | Disposition: A | Discharge status: Home / Self Care | Payer: Medicare Other | Source: Ambulatory Visit | Attending: Physician Assistant | Admitting: Physician Assistant

## 2017-03-01 VITALS — BP 124/76 | HR 67 | Temp 97.3°F | Resp 20 | Ht 64.5 in | Wt 147.2 lb

## 2017-03-01 DIAGNOSIS — M50321 Other cervical disc degeneration at C4-C5 level: Secondary | ICD-10-CM | POA: Diagnosis not present

## 2017-03-01 DIAGNOSIS — R079 Chest pain, unspecified: Secondary | ICD-10-CM | POA: Diagnosis not present

## 2017-03-01 DIAGNOSIS — K219 Gastro-esophageal reflux disease without esophagitis: Secondary | ICD-10-CM

## 2017-03-01 DIAGNOSIS — M542 Cervicalgia: Secondary | ICD-10-CM

## 2017-03-01 LAB — CBC WITH DIFFERENTIAL/PLATELET
Basophils Absolute: 46 cells/uL (ref 0–200)
Basophils Relative: 0.5 %
Eosinophils Absolute: 377 cells/uL (ref 15–500)
Eosinophils Relative: 4.1 %
HCT: 36.7 % (ref 35.0–45.0)
Hemoglobin: 12.3 g/dL (ref 11.7–15.5)
Lymphs Abs: 1251 cells/uL (ref 850–3900)
MCH: 29.1 pg (ref 27.0–33.0)
MCHC: 33.5 g/dL (ref 32.0–36.0)
MCV: 87 fL (ref 80.0–100.0)
MPV: 11 fL (ref 7.5–12.5)
Monocytes Relative: 8.9 %
Neutro Abs: 6707 cells/uL (ref 1500–7800)
Neutrophils Relative %: 72.9 %
Platelets: 110 10*3/uL — ABNORMAL LOW (ref 140–400)
RBC: 4.22 10*6/uL (ref 3.80–5.10)
RDW: 12.1 % (ref 11.0–15.0)
Total Lymphocyte: 13.6 %
WBC mixed population: 819 cells/uL (ref 200–950)
WBC: 9.2 10*3/uL (ref 3.8–10.8)

## 2017-03-01 LAB — HEPATIC FUNCTION PANEL
AG Ratio: 1.2 (calc) (ref 1.0–2.5)
ALT: 8 U/L (ref 6–29)
AST: 14 U/L (ref 10–35)
Albumin: 3.7 g/dL (ref 3.6–5.1)
Alkaline phosphatase (APISO): 84 U/L (ref 33–130)
Bilirubin, Direct: 0.1 mg/dL (ref 0.0–0.2)
Globulin: 3 g/dL (calc) (ref 1.9–3.7)
Indirect Bilirubin: 0.6 mg/dL (calc) (ref 0.2–1.2)
Total Bilirubin: 0.7 mg/dL (ref 0.2–1.2)
Total Protein: 6.7 g/dL (ref 6.1–8.1)

## 2017-03-01 LAB — BASIC METABOLIC PANEL WITH GFR
BUN/Creatinine Ratio: 18 (calc) (ref 6–22)
BUN: 20 mg/dL (ref 7–25)
CO2: 28 mmol/L (ref 20–32)
Calcium: 9.8 mg/dL (ref 8.6–10.4)
Chloride: 102 mmol/L (ref 98–110)
Creat: 1.1 mg/dL — ABNORMAL HIGH (ref 0.60–0.93)
GFR, Est African American: 58 mL/min/{1.73_m2} — ABNORMAL LOW (ref >=60)
GFR, Est Non African American: 50 mL/min/{1.73_m2} — ABNORMAL LOW (ref >=60)
Glucose, Bld: 70 mg/dL (ref 65–99)
Potassium: 3.9 mmol/L (ref 3.5–5.3)
Sodium: 139 mmol/L (ref 135–146)

## 2017-03-01 LAB — CK: Total CK: 25 U/L — ABNORMAL LOW (ref 29–143)

## 2017-03-01 MED ORDER — MELOXICAM 15 MG PO TABS
ORAL_TABLET | ORAL | 1 refills | Status: DC
Start: 2017-03-01 — End: 2017-04-05

## 2017-03-01 MED ORDER — BACLOFEN 10 MG PO TABS: 10.0000 mg | ORAL_TABLET | Freq: Every day | ORAL | 1 refills | Status: DC

## 2017-03-01 MED ORDER — PANTOPRAZOLE SODIUM 40 MG PO TBEC: 40.0000 mg | DELAYED_RELEASE_TABLET | Freq: Every day | ORAL | 1 refills | Status: DC

## 2017-03-01 NOTE — Patient Instructions (Signed)
Try the exercises and other information below, meloxicam once during the day as needed (avoid taking other NSAIDS like Alleve or Ibuprofen while taking this) and then baclofen (1/2-1) if needed at bedtime for muscle spasm. This can be taken up to every 8 hours, but causes sedation, so should not drive or operate heavy machinery while taking this medicine.   Go to the ER if you have any new weakness in your arms, trouble with your grip, worse headache ever, fever, chills. or have worsening pain.   If you are not better in 1-3 month we will refer you to ortho   Cervical Sprain A cervical sprain is a stretch or tear in one or more of the tough, cord-like tissues that connect bones (ligaments) in the neck. Cervical sprains can range from mild to severe. Severe cervical sprains can cause the spinal bones (vertebrae) in the neck to be unstable. This can lead to spinal cord damage and can result in serious nervous system problems. The amount of time that it takes for a cervical sprain to get better depends on the cause and extent of the injury. Most cervical sprains heal in 4-6 weeks. What are the causes? Cervical sprains may be caused by an injury (trauma), such as from a motor vehicle accident, a fall, or sudden forward and backward whipping movement of the head and neck (whiplash injury). Mild cervical sprains may be caused by wear and tear over time, such as from poor posture, sitting in a chair that does not provide support, or looking up or down for long periods of time. What increases the risk? The following factors may make you more likely to develop this condition:  Participating in activities that have a high risk of trauma to the neck. These include contact sports, auto racing, gymnastics, and diving.  Taking risks when driving or riding in a motor vehicle, such as speeding.  Having osteoarthritis of the spine.  Having poor strength and flexibility of the neck.  A previous neck  injury.  Having poor posture.  Spending a lot of time in certain positions that put stress on the neck, such as sitting at a computer for long periods of time.  What are the signs or symptoms? Symptoms of this condition include:  Pain, soreness, stiffness, tenderness, swelling, or a burning sensation in the front, back, or sides of the neck.  Sudden tightening of neck muscles that you cannot control (muscle spasms).  Pain in the shoulders or upper back.  Limited ability to move the neck.  Headache.  Dizziness.  Nausea.  Vomiting.  Weakness, numbness, or tingling in a hand or an arm.  Symptoms may develop right away after injury, or they may develop over a few days. In some cases, symptoms may go away with treatment and return (recur) over time. How is this diagnosed? This condition may be diagnosed based on:  Your medical history.  Your symptoms.  Any recent injuries or known neck problems that you have, such as arthritis in the neck.  A physical exam.  Imaging tests, such as: ? X-rays. ? MRI. ? CT scan.  How is this treated? This condition is treated by resting and icing the injured area and doing physical therapy exercises. Depending on the severity of your condition, treatment may also include:  Keeping your neck in place (immobilized) for periods of time. This may be done using: ? A cervical collar. This supports your chin and the back of your head. ? A cervical traction  device. This is a sling that holds up your head. This removes weight and pressure from your neck, and it may help to relieve pain.  Medicines that help to relieve pain and inflammation.  Medicines that help to relax your muscles (muscle relaxants).  Surgery. This is rare.  Follow these instructions at home: If you have a cervical collar:  Wear it as told by your health care provider. Do not remove the collar unless instructed by your health care provider.  Ask your health care  provider before you make any adjustments to your collar.  If you have long hair, keep it outside of the collar.  Ask your health care provider if you can remove the collar for cleaning and bathing. If you are allowed to remove the collar for cleaning or bathing: ? Follow instructions from your health care provider about how to remove the collar safely. ? Clean the collar by wiping it with mild soap and water and drying it completely. ? If your collar has removable pads, remove them every 1-2 days and wash them by hand with soap and water. Let them air-dry completely before you put them back in the collar. ? Check your skin under the collar for irritation or sores. If you see any, tell your health care provider. Managing pain, stiffness, and swelling  If directed, use a cervical traction device as told by your health care provider.  If directed, apply heat to the affected area before you do your physical therapy or as often as told by your health care provider. Use the heat source that your health care provider recommends, such as a moist heat pack or a heating pad. ? Place a towel between your skin and the heat source. ? Leave the heat on for 20-30 minutes. ? Remove the heat if your skin turns bright red. This is especially important if you are unable to feel pain, heat, or cold. You may have a greater risk of getting burned.  If directed, put ice on the affected area: ? Put ice in a plastic bag. ? Place a towel between your skin and the bag. ? Leave the ice on for 20 minutes, 2-3 times a day. Activity  Do not drive while wearing a cervical collar. If you do not have a cervical collar, ask your health care provider if it is safe to drive while your neck heals.  Do not drive or use heavy machinery while taking prescription pain medicine or muscle relaxants, unless your health care provider approves.  Do not lift anything that is heavier than 10 lb (4.5 kg) until your health care provider  tells you that it is safe.  Rest as directed by your health care provider. Avoid positions and activities that make your symptoms worse. Ask your health care provider what activities are safe for you.  If physical therapy was prescribed, do exercises as told by your health care provider or physical therapist. General instructions  Take over-the-counter and prescription medicines only as told by your health care provider.  Do not use any products that contain nicotine or tobacco, such as cigarettes and e-cigarettes. These can delay healing. If you need help quitting, ask your health care provider.  Keep all follow-up visits as told by your health care provider or physical therapist. This is important. How is this prevented? To prevent a cervical sprain from happening again:  Use and maintain good posture. Make any needed adjustments to your workstation to help you use good posture.  Exercise regularly as directed by your health care provider or physical therapist.  Avoid risky activities that may cause a cervical sprain.  Contact a health care provider if:  You have symptoms that get worse or do not get better after 2 weeks of treatment.  You have pain that gets worse or does not get better with medicine.  You develop new, unexplained symptoms.  You have sores or irritated skin on your neck from wearing your cervical collar. Get help right away if:  You have severe pain.  You develop numbness, tingling, or weakness in any part of your body.  You cannot move a part of your body (you have paralysis).  You have neck pain along with: ? Severe dizziness. ? Headache. Summary  A cervical sprain is a stretch or tear in one or more of the tough, cord-like tissues that connect bones (ligaments) in the neck.  Cervical sprains may be caused by an injury (trauma), such as from a motor vehicle accident, a fall, or sudden forward and backward whipping movement of the head and neck  (whiplash injury).  Symptoms may develop right away after injury, or they may develop over a few days.  This condition is treated by resting and icing the injured area and doing physical therapy exercises. This information is not intended to replace advice given to you by your health care provider. Make sure you discuss any questions you have with your health care provider. Document Released: 01/29/2007 Document Revised: 12/01/2015 Document Reviewed: 12/01/2015 Elsevier Interactive Patient Education  2017 [employer]   Cervical Strain and Sprain Rehab Ask your health care provider which exercises are safe for you. Do exercises exactly as told by your health care provider and adjust them as directed. It is normal to feel mild stretching, pulling, tightness, or discomfort as you do these exercises, but you should stop right away if you feel sudden pain or your pain gets worse.Do not begin these exercises until told by your health care provider. Stretching and range of motion exercises These exercises warm up your muscles and joints and improve the movement and flexibility of your neck. These exercises also help to relieve pain, numbness, and tingling. Exercise A: Cervical side bend  1. Using good posture, sit on a stable chair or stand up. 2. Without moving your shoulders, slowly tilt your left / right ear to your shoulder until you feel a stretch in your neck muscles. You should be looking straight ahead. 3. Hold for __________ seconds. 4. Repeat with the other side of your neck. Repeat __________ times. Complete this exercise __________ times a day. Exercise B: Cervical rotation  1. Using good posture, sit on a stable chair or stand up. 2. Slowly turn your head to the side as if you are looking over your left / right shoulder. ? Keep your eyes level with the ground. ? Stop when you feel a stretch along the side and the back of your neck. 3. Hold for __________ seconds. 4. Repeat this  by turning to your other side. Repeat __________ times. Complete this exercise __________ times a day. Exercise C: Thoracic extension and pectoral stretch 1. Roll a towel or a small blanket so it is about 4 inches (10 cm) in diameter. 2. Lie down on your back on a firm surface. 3. Put the towel lengthwise, under your spine in the middle of your back. It should not be not under your shoulder blades. The towel should line up with your spine from  your middle back to your lower back. 4. Put your hands behind your head and let your elbows fall out to your sides. 5. Hold for __________ seconds. Repeat __________ times. Complete this exercise __________ times a day. Strengthening exercises These exercises build strength and endurance in your neck. Endurance is the ability to use your muscles for a long time, even after your muscles get tired. Exercise D: Upper cervical flexion, isometric 1. Lie on your back with a thin pillow behind your head and a small rolled-up towel under your neck. 2. Gently tuck your chin toward your chest and nod your head down to look toward your feet. Do not lift your head off the pillow. 3. Hold for __________ seconds. 4. Release the tension slowly. Relax your neck muscles completely before you repeat this exercise. Repeat __________ times. Complete this exercise __________ times a day. Exercise E: Cervical extension, isometric  1. Stand about 6 inches (15 cm) away from a wall, with your back facing the wall. 2. Place a soft object, about 6-8 inches (15-20 cm) in diameter, between the back of your head and the wall. A soft object could be a small pillow, a ball, or a folded towel. 3. Gently tilt your head back and press into the soft object. Keep your jaw and forehead relaxed. 4. Hold for __________ seconds. 5. Release the tension slowly. Relax your neck muscles completely before you repeat this exercise. Repeat __________ times. Complete this exercise __________ times a  day. Posture and body mechanics  Body mechanics refers to the movements and positions of your body while you do your daily activities. Posture is part of body mechanics. Good posture and healthy body mechanics can help to relieve stress in your body's tissues and joints. Good posture means that your spine is in its natural S-curve position (your spine is neutral), your shoulders are pulled back slightly, and your head is not tipped forward. The following are general guidelines for applying improved posture and body mechanics to your everyday activities. Standing  When standing, keep your spine neutral and keep your feet about hip-width apart. Keep a slight bend in your knees. Your ears, shoulders, and hips should line up.  When you do a task in which you stand in one place for a long time, place one foot up on a stable object that is 2-4 inches (5-10 cm) high, such as a footstool. This helps keep your spine neutral. Sitting   When sitting, keep your spine neutral and your keep feet flat on the floor. Use a footrest, if necessary, and keep your thighs parallel to the floor. Avoid rounding your shoulders, and avoid tilting your head forward.  When working at a desk or a computer, keep your desk at a height where your hands are slightly lower than your elbows. Slide your chair under your desk so you are close enough to maintain good posture.  When working at a computer, place your monitor at a height where you are looking straight ahead and you do not have to tilt your head forward or downward to look at the screen. Resting When lying down and resting, avoid positions that are most painful for you. Try to support your neck in a neutral position. You can use a contour pillow or a small rolled-up towel. Your pillow should support your neck but not push on it. This information is not intended to replace advice given to you by your health care provider. Make sure you discuss any questions you  have with  your health care provider. Document Released: 04/03/2005 Document Revised: 12/09/2015 Document Reviewed: 03/10/2015 Elsevier Interactive Patient Education  [employer].

## 2017-03-01 NOTE — Progress Notes (Signed)
Subjective:    Patient ID: Jillian Rodgers, female    DOB: 03/27/1946, 71 y.o.   MRN: [id number]  HPI 71 y.o. right handed WF presents with neck pain. No injury that she can recall other than weekend had grandson and lifted him a lot.  She states started with left shoulder 1 week ago, started shortly after picking up grandson, then went to neck and now it is down her back some. It is worse with putting on clothes, putting her arms above her arms. Tuesday night got worse, was sleeping in her bed and it was hurting so bad she could not prop herself up, had to call her husband to help her, so she has been sleeping in the recliner.  She has had some discomfort/ache in bilateral arms, with sharp pain in neck. Denies numbness,tingling, weakness. No SOB, has had fatigue but has been dealing with stomach issues. She is on pravastatin. No tick exposure.  No light sensitivity, nausea, or headaches, no fever or chills.   Has been doing heat that has helped some, has not tried any medications.   Blood pressure 124/76, pulse 67, temperature (!) 97.3 F (36.3 C), resp. rate 20, height 5' 4.5" (1.638 m), weight 147 lb 3.2 oz (66.8 kg), SpO2 95 %.  Medications Current Outpatient Medications on File Prior to Visit  Medication Sig  . Ascorbic Acid (VITAMIN C) 500 MG CAPS Take 1 capsule by mouth daily.  [name] Kitchen aspirin 81 MG tablet Take 81 mg by mouth daily.  . benzonatate (TESSALON PERLES) 100 MG capsule Take 2 capsules (200 mg total) by mouth 3 (three) times daily as needed for cough (Max: 600mg  per day).  . BOTOX 100 units SOLR injection   . cetirizine (ZYRTEC) 10 MG tablet Take 10 mg by mouth daily.  . Cholecalciferol (VITAMIN D PO) Take 5,000 Units by mouth daily.  [name] Kitchen erythromycin with ethanol (THERAMYCIN) 2 % external solution Apply topically daily.  [name] Kitchen gabapentin (NEURONTIN) 600 MG tablet Take 600 mg by mouth 4 (four) times daily.   [name] Kitchen levothyroxine (SYNTHROID, LEVOTHROID) 50 MCG tablet TAKE 1  TABLET BY MOUTH EVERY MORNING ON AN EMPTY STOMACH  . OVER THE COUNTER MEDICATION Taking Tangy Tangerine tabs 2.0 # 2 tabs daily.  . pantoprazole (PROTONIX) 40 MG tablet Take 1 tablet (40 mg total) by mouth daily.  . pravastatin (PRAVACHOL) 40 MG tablet TAKE 1 TABLET BY MOUTH AT BEDTIME  . ranitidine (ZANTAC) 300 MG tablet TAKE ONE TABLET BY MOUTH TWICE DAILY FOR ACID INDIGESTION AND REFLUX   No current facility-administered medications on file prior to visit.     Problem list She has Essential hypertension; Degenerative joint disease; GERD (gastroesophageal reflux disease); Hyperlipidemia; Hypothyroidism; Vitamin D deficiency; Prediabetes; Medication management; Upper airway cough syndrome; CKD Stage 3 (GFR 36 ml/min); BMI 26.71,  adult; Encounter for Medicare annual wellness exam; and Thrombocytopenia ([place]) on their problem list.   Review of Systems  Constitutional: Negative.  Negative for chills, fatigue and fever.  HENT: Negative.  Negative for congestion, sore throat and trouble swallowing.   Respiratory: Negative.   Cardiovascular: Negative.   Genitourinary: Negative.   Musculoskeletal: Positive for neck pain and neck stiffness. Negative for arthralgias, back pain, gait problem, joint swelling and myalgias.  Skin: Negative.  Negative for rash.  Neurological: Negative.  Negative for dizziness, tremors, seizures, syncope, facial asymmetry, speech difficulty, weakness, light-headedness, numbness and headaches.  Psychiatric/Behavioral: Negative for agitation and confusion.       Objective:  Physical Exam  Constitutional: She is oriented to person, place, and time. She appears well-developed and well-nourished.  HENT:  Head: Normocephalic and atraumatic.  Eyes: Conjunctivae are normal. Pupils are equal, round, and reactive to light.  Neck: Normal range of motion. Neck supple.  Cardiovascular: Normal rate and regular rhythm.  Pulmonary/Chest: Effort normal and breath sounds normal.   Abdominal: Soft. Bowel sounds are normal. There is no tenderness.  Musculoskeletal:  supple and limited flexion, right rotation and left rotation, with spinous process tenderness, with paraspinal muscle tenderness both sides, normal sensation, reflexes, and pulses distal.  Lymphadenopathy:    She has no cervical adenopathy.  Neurological: She is alert and oriented to person, place, and time. She has normal reflexes.  Skin: Skin is warm and dry. No rash noted.      Assessment & Plan:    Neck pain Likely musculoskeletal, mobic x 2 weeks, baclofen, get Xray, ? May need CT scan some mid line tenderness and pain in arms but normal exam.  -     CBC with Differential/Platelet -     BASIC METABOLIC PANEL WITH GFR -     Hepatic function panel -     DG Cervical Spine Complete; Future -     CK --     meloxicam (MOBIC) 15 MG tablet; Take one daily with food for 2 weeks, can take with tylenol, can not take with aleve, iburpofen, then as needed daily for pain -     baclofen (LIORESAL) 10 MG tablet; Take 1 tablet (10 mg total) daily by mouth.  Chest pain, unspecified type -     EKG 12-Lead- WNL no changes  Gastroesophageal reflux disease, esophagitis presence not specified -     pantoprazole (PROTONIX) 40 MG tablet; Take 1 tablet (40 mg total) daily by mouth.

## 2017-03-02 NOTE — Progress Notes (Signed)
LVM for pt to return office call for LAB results.

## 2017-03-14 NOTE — Progress Notes (Signed)
LVM for pt to return office call for LAB results.

## 2017-03-26 ENCOUNTER — Encounter: Payer: Self-pay | Admitting: Internal Medicine

## 2017-04-05 ENCOUNTER — Ambulatory Visit: Payer: Medicare Other | Admitting: Internal Medicine

## 2017-04-05 VITALS — BP 104/72 | HR 76 | Temp 97.5°F | Resp 16 | Ht 63.25 in | Wt 153.6 lb

## 2017-04-05 DIAGNOSIS — N183 Chronic kidney disease, stage 3 unspecified: Secondary | ICD-10-CM

## 2017-04-05 DIAGNOSIS — Z1211 Encounter for screening for malignant neoplasm of colon: Secondary | ICD-10-CM

## 2017-04-05 DIAGNOSIS — E559 Vitamin D deficiency, unspecified: Secondary | ICD-10-CM

## 2017-04-05 DIAGNOSIS — E782 Mixed hyperlipidemia: Secondary | ICD-10-CM

## 2017-04-05 DIAGNOSIS — I10 Essential (primary) hypertension: Secondary | ICD-10-CM | POA: Diagnosis not present

## 2017-04-05 DIAGNOSIS — Z1212 Encounter for screening for malignant neoplasm of rectum: Secondary | ICD-10-CM

## 2017-04-05 DIAGNOSIS — R7303 Prediabetes: Secondary | ICD-10-CM

## 2017-04-05 DIAGNOSIS — Z136 Encounter for screening for cardiovascular disorders: Secondary | ICD-10-CM | POA: Diagnosis not present

## 2017-04-05 DIAGNOSIS — Z0000 Encounter for general adult medical examination without abnormal findings: Secondary | ICD-10-CM | POA: Diagnosis not present

## 2017-04-05 DIAGNOSIS — R7309 Other abnormal glucose: Secondary | ICD-10-CM

## 2017-04-05 DIAGNOSIS — Z79899 Other long term (current) drug therapy: Secondary | ICD-10-CM

## 2017-04-05 DIAGNOSIS — Z0001 Encounter for general adult medical examination with abnormal findings: Secondary | ICD-10-CM

## 2017-04-05 DIAGNOSIS — E039 Hypothyroidism, unspecified: Secondary | ICD-10-CM

## 2017-04-05 NOTE — Progress Notes (Signed)
[facility] Unk Pinto, M.D.     Uvaldo Bristle. Silverio Lay, P.A.-C Liane Comber, [place] [address] [place], [place] [address] Telephone [phone] Telefax [phone] Annual Screening/Preventative Visit & Comprehensive Evaluation &  Examination     This very nice 71 y.o. MWF presents for a Screening/Preventative Visit & comprehensive evaluation and management of multiple medical co-morbidities.  Patient has been followed for labile HTN, Prediabetes, Hyperlipidemia, Hypothyroidism and Vitamin D Deficiency. Patient has GERD & failed trial of transition of Protonix to Ranitidine alone and currently is controlled on Protonix/am & Ranitidine /hs.       Patient has been monitored expectantly for labile HTN since the 1990's. Patient's BP has been controlled at home and patient denies any cardiac symptoms as chest pain, palpitations, shortness of breath, dizziness or ankle swelling. Today's BP is at goal - 104/72.      Patient's hyperlipidemia is controlled with diet and medications. Patient denies myalgias or other medication SE's. Last lipids were at goal albeit sl elevated Trig's: Lab Results  Component Value Date   CHOL 175 04/05/2017   HDL 71 04/05/2017   LDLCALC 68 09/26/2016   TRIG 119 04/05/2017   CHOLHDL 2.5 04/05/2017      Patient has prediabetes (A1c 6.0% / 2012)  and patient denies reactive hypoglycemic symptoms, visual blurring, diabetic polys, or paresthesias. Last A1c was normal & at goal: Lab Results  Component Value Date   HGBA1C 5.7 (H) 04/05/2017      Finally, patient has history of Vitamin D Deficiency ("39" on treatment in 2012) and last Vitamin D was at goal: Lab Results  Component Value Date   VD25OH 89 04/05/2017   Current Outpatient Medications on File Prior to Visit  Medication Sig  . Ascorbic Acid (VITAMIN C) 500 MG CAPS Take 1 capsule by mouth daily.  [name] Kitchen aspirin 81 MG tablet  Take 81 mg by mouth daily.  [name] Kitchen BOTOX 100 units SOLR injection   . cetirizine (ZYRTEC) 10 MG tablet Take 10 mg by mouth daily.  . Cholecalciferol (VITAMIN D PO) Take 5,000 Units by mouth daily.  [name] Kitchen erythromycin with ethanol (THERAMYCIN) 2 % external solution Apply topically daily.  [name] Kitchen gabapentin (NEURONTIN) 600 MG tablet Take 600 mg by mouth 4 (four) times daily.   [name] Kitchen levothyroxine (SYNTHROID, LEVOTHROID) 50 MCG tablet TAKE 1 TABLET BY MOUTH EVERY MORNING ON AN EMPTY STOMACH  . OVER THE COUNTER MEDICATION Taking Tangy Tangerine tabs 2.0 # 2 tabs daily.  . pantoprazole (PROTONIX) 40 MG tablet Take 1 tablet (40 mg total) daily by mouth.  . pravastatin (PRAVACHOL) 40 MG tablet TAKE 1 TABLET BY MOUTH AT BEDTIME  . ranitidine (ZANTAC) 300 MG tablet TAKE ONE TABLET BY MOUTH TWICE DAILY FOR ACID INDIGESTION AND REFLUX   No current facility-administered medications on file prior to visit.    No Known Allergies Past Medical History:  Diagnosis Date  . Degenerative joint disease   . Diabetes mellitus without complication ([place])   . GERD (gastroesophageal reflux disease)   . Hyperlipidemia   . Hypertension   . Migraines    neurontin helps  . Pre-diabetes   . Thyroid disease    Health Maintenance  Topic Date Due  . Hepatitis C Screening  1945/10/06  . MAMMOGRAM  01/30/2015  . COLONOSCOPY  09/25/2017  . TETANUS/TDAP  11/11/2024  . INFLUENZA VACCINE  Completed  . DEXA SCAN  Completed  . PNA vac Low Risk Adult  Completed   Immunization History  Administered Date(s) Administered  . Influenza, High Dose Seasonal PF 12/29/2013, 01/15/2015, 02/01/2016, 02/28/2017  . Influenza-Unspecified 01/24/2011, 02/02/2012  . Pneumococcal Conjugate-13 10/28/2014  . Pneumococcal-Unspecified 11/28/2011  . Td 11/17/2010  . Tdap 11/12/2014  . Zoster 11/17/2010   Last Colon - 2009, recc 10 year f/u Last Pap -  Past Surgical History:  Procedure Laterality Date  . CHOLECYSTECTOMY  1996   open  . COLONOSCOPY     . PILONIDAL CYST EXCISION  1972  . TONSILLECTOMY    . UPPER GI ENDOSCOPY    . VENTRAL HERNIA REPAIR     Family History  Problem Relation Age of Onset  . Ovarian cancer Mother   . Breast cancer Sister   . Thyroid cancer Sister   . Stroke Paternal Uncle    Social History   Tobacco Use  . Smoking status: Never Smoker  . Smokeless tobacco: Never Used  Substance Use Topics  . Alcohol use: No  . Drug use: No    ROS Constitutional: Denies fever, chills, weight loss/gain, headaches, insomnia,  night sweats, and change in appetite. Does c/o fatigue. Eyes: Denies redness, blurred vision, diplopia, discharge, itchy, watery eyes.  ENT: Denies discharge, congestion, post nasal drip, epistaxis, sore throat, earache, hearing loss, dental pain, Tinnitus, Vertigo, Sinus pain, snoring.  Cardio: Denies chest pain, palpitations, irregular heartbeat, syncope, dyspnea, diaphoresis, orthopnea, PND, claudication, edema Respiratory: denies cough, dyspnea, DOE, pleurisy, hoarseness, laryngitis, wheezing.  Gastrointestinal: Denies dysphagia, heartburn, reflux, water brash, pain, cramps, nausea, vomiting, bloating, diarrhea, constipation, hematemesis, melena, hematochezia, jaundice, hemorrhoids Genitourinary: Denies dysuria, frequency, urgency, nocturia, hesitancy, discharge, hematuria, flank pain Breast: Breast lumps, nipple discharge, bleeding.  Musculoskeletal: Denies arthralgia, myalgia, stiffness, Jt. Swelling, pain, limp, and strain/sprain. Denies falls. Skin: Denies puritis, rash, hives, warts, acne, eczema, changing in skin lesion Neuro: No weakness, tremor, incoordination, spasms, paresthesia, pain Psychiatric: Denies confusion, memory loss, sensory loss. Denies Depression. Endocrine: Denies change in weight, skin, hair change, nocturia, and paresthesia, diabetic polys, visual blurring, hyper / hypo glycemic episodes.  Heme/Lymph: No excessive bleeding, bruising, enlarged lymph nodes.  Physical  Exam  BP 104/72   Pulse 76   Temp (!) 97.5 F (36.4 C)   Resp 16   Ht 5' 3.25" (1.607 m)   Wt 153 lb 9.6 oz (69.7 kg)   BMI 26.99 kg/m   General Appearance: Well nourished, well groomed and in no apparent distress.  Eyes: PERRLA, EOMs, conjunctiva no swelling or erythema, normal fundi and vessels. Sinuses: No frontal/maxillary tenderness ENT/Mouth: EACs patent / TMs  nl. Nares clear without erythema, swelling, mucoid exudates. Oral hygiene is good. No erythema, swelling, or exudate. Tongue normal, non-obstructing. Tonsils not swollen or erythematous. Hearing normal.  Neck: Supple, thyroid normal. No bruits, nodes or JVD. Respiratory: Respiratory effort normal.  BS equal and clear bilateral without rales, rhonci, wheezing or stridor. Cardio: Heart sounds are normal with regular rate and rhythm and no murmurs, rubs or gallops. Peripheral pulses are normal and equal bilaterally without edema. No aortic or femoral bruits. Chest: symmetric with normal excursions and percussion. Breasts: Symmetric, without lumps, nipple discharge, retractions, or fibrocystic changes.  Abdomen: Flat, soft with bowel sounds active. Nontender, no guarding, rebound, hernias, masses, or organomegaly.  Lymphatics: Non tender without lymphadenopathy.  Genitourinary:  Musculoskeletal: Full ROM all peripheral extremities, joint stability, 5/5 strength, and normal gait. Skin: Warm and dry without rashes, lesions, cyanosis, clubbing or  ecchymosis.  Neuro: Cranial nerves intact, reflexes equal bilaterally. Normal  muscle tone, no cerebellar symptoms. Sensation intact.  Pysch: Alert and oriented X 3, normal affect, Insight and Judgment appropriate.   Assessment and Plan  1. Annual Preventative Screening Examination  2. Essential hypertension  - EKG 12-Lead - Urinalysis, Routine w reflex microscopic - Microalbumin / creatinine urine ratio - CBC with Differential/Platelet - BASIC METABOLIC PANEL WITH GFR -  Magnesium - TSH  3. Hyperlipidemia, mixed  - EKG 12-Lead - Hepatic function panel - Lipid panel - TSH  4. Prediabetes  - EKG 12-Lead - Hemoglobin A1c - Insulin, random  5. Vitamin D deficiency  - VITAMIN D 25 Hydroxy  6. Abnormal glucose  - Hemoglobin A1c - Insulin, random  7. Hypothyroidism  - TSH  8. CKD Stage 3 (GFR 36 ml/min)  - BASIC METABOLIC PANEL WITH GFR  9. Encounter for colorectal cancer screening  - POC Hemoccult Bld/Stl (3-Cd Home Screen)  10. Screening for ischemic heart disease  - EKG 12-Lead  11. Medication management  - Urinalysis, Routine w reflex microscopic - Microalbumin / creatinine urine ratio - CBC with Differential/Platelet - BASIC METABOLIC PANEL WITH GFR - Hepatic function panel - Magnesium - Lipid panel - TSH - Hemoglobin A1c - Insulin, random - VITAMIN D 25 Hydroxy        Patient was counseled in prudent diet to achieve/maintain BMI less than 25 for weight control, BP monitoring, regular exercise and medications. Discussed med's effects and SE's. Screening labs and tests as requested with regular follow-up as recommended. Over 40 minutes of exam, counseling, chart review and high complex critical decision making was performed.

## 2017-04-05 NOTE — Patient Instructions (Signed)

## 2017-04-06 LAB — BASIC METABOLIC PANEL WITH GFR
BUN/Creatinine Ratio: 24 (calc) — ABNORMAL HIGH (ref 6–22)
BUN: 26 mg/dL — ABNORMAL HIGH (ref 7–25)
CO2: 28 mmol/L (ref 20–32)
Calcium: 10.4 mg/dL (ref 8.6–10.4)
Chloride: 105 mmol/L (ref 98–110)
Creat: 1.08 mg/dL — ABNORMAL HIGH (ref 0.60–0.93)
GFR, Est African American: 59 mL/min/{1.73_m2} — ABNORMAL LOW (ref >=60)
GFR, Est Non African American: 51 mL/min/{1.73_m2} — ABNORMAL LOW (ref >=60)
Glucose, Bld: 69 mg/dL (ref 65–99)
Potassium: 4.3 mmol/L (ref 3.5–5.3)
Sodium: 141 mmol/L (ref 135–146)

## 2017-04-06 LAB — CBC WITH DIFFERENTIAL/PLATELET
Basophils Absolute: 62 cells/uL (ref 0–200)
Basophils Relative: 0.9 %
Eosinophils Absolute: 531 cells/uL — ABNORMAL HIGH (ref 15–500)
Eosinophils Relative: 7.7 %
HCT: 36.2 % (ref 35.0–45.0)
Hemoglobin: 11.9 g/dL (ref 11.7–15.5)
Lymphs Abs: 1670 cells/uL (ref 850–3900)
MCH: 28.9 pg (ref 27.0–33.0)
MCHC: 32.9 g/dL (ref 32.0–36.0)
MCV: 87.9 fL (ref 80.0–100.0)
MPV: 11 fL (ref 7.5–12.5)
Monocytes Relative: 10.3 %
Neutro Abs: 3926 cells/uL (ref 1500–7800)
Neutrophils Relative %: 56.9 %
Platelets: 101 10*3/uL — ABNORMAL LOW (ref 140–400)
RBC: 4.12 10*6/uL (ref 3.80–5.10)
RDW: 12.8 % (ref 11.0–15.0)
Total Lymphocyte: 24.2 %
WBC mixed population: 711 cells/uL (ref 200–950)
WBC: 6.9 10*3/uL (ref 3.8–10.8)

## 2017-04-06 LAB — URINALYSIS, ROUTINE W REFLEX MICROSCOPIC
Bilirubin Urine: NEGATIVE
Glucose, UA: NEGATIVE
Hgb urine dipstick: NEGATIVE
Ketones, ur: NEGATIVE
Leukocytes, UA: NEGATIVE
Nitrite: NEGATIVE
Protein, ur: NEGATIVE
Specific Gravity, Urine: 1.015 (ref 1.001–1.03)
pH: 5.5 (ref 5.0–8.0)

## 2017-04-06 LAB — LIPID PANEL
Cholesterol: 175 mg/dL (ref <200)
HDL: 71 mg/dL (ref >50)
LDL Cholesterol (Calc): 82 mg/dL (calc)
Non-HDL Cholesterol (Calc): 104 mg/dL (calc) (ref <130)
Total CHOL/HDL Ratio: 2.5 (calc) (ref <5.0)
Triglycerides: 119 mg/dL (ref <150)

## 2017-04-06 LAB — HEPATIC FUNCTION PANEL
AG Ratio: 1.4 (calc) (ref 1.0–2.5)
ALT: 10 U/L (ref 6–29)
AST: 15 U/L (ref 10–35)
Albumin: 4 g/dL (ref 3.6–5.1)
Alkaline phosphatase (APISO): 87 U/L (ref 33–130)
Bilirubin, Direct: 0.2 mg/dL (ref 0.0–0.2)
Globulin: 2.8 g/dL (calc) (ref 1.9–3.7)
Indirect Bilirubin: 0.5 mg/dL (calc) (ref 0.2–1.2)
Total Bilirubin: 0.7 mg/dL (ref 0.2–1.2)
Total Protein: 6.8 g/dL (ref 6.1–8.1)

## 2017-04-06 LAB — HEMOGLOBIN A1C
Hgb A1c MFr Bld: 5.7 % of total Hgb — ABNORMAL HIGH (ref <5.7)
Mean Plasma Glucose: 117 (calc)
eAG (mmol/L): 6.5 (calc)

## 2017-04-06 LAB — INSULIN, RANDOM: Insulin: 3.5 u[IU]/mL (ref 2.0–19.6)

## 2017-04-06 LAB — MICROALBUMIN / CREATININE URINE RATIO
Creatinine, Urine: 56 mg/dL (ref 20–275)
Microalb Creat Ratio: 7 mcg/mg creat (ref <30)
Microalb, Ur: 0.4 mg/dL

## 2017-04-06 LAB — VITAMIN D 25 HYDROXY (VIT D DEFICIENCY, FRACTURES): Vit D, 25-Hydroxy: 89 ng/mL (ref 30–100)

## 2017-04-06 LAB — TSH: TSH: 1.85 mIU/L (ref 0.40–4.50)

## 2017-04-06 LAB — MAGNESIUM: Magnesium: 2.1 mg/dL (ref 1.5–2.5)

## 2017-04-09 ENCOUNTER — Encounter: Payer: Self-pay | Admitting: Internal Medicine

## 2017-04-16 ENCOUNTER — Encounter: Payer: Self-pay | Admitting: Adult Health

## 2017-04-23 ENCOUNTER — Other Ambulatory Visit: Payer: Self-pay | Admitting: Internal Medicine

## 2017-04-25 ENCOUNTER — Encounter: Payer: Self-pay | Admitting: Internal Medicine

## 2017-07-07 NOTE — Progress Notes (Signed)
MEDICARE ANNUAL WELLNESS VISIT AND FOLLOW UP  Assessment:   Diagnoses and all orders for this visit:  Encounter for Medicare annual wellness exam  Essential hypertension At goal; continue medication Monitor blood pressure at home; call if consistently over 130/80 Continue DASH diet.   Reminder to go to the ER if any CP, SOB, nausea, dizziness, severe HA, changes vision/speech, left arm numbness and tingling and jaw pain. -     Magnesium  Gastroesophageal reflux disease, esophagitis presence not specified Well managed on current medications; has failed PPI taper in the past Discussed diet, avoiding triggers and other lifestyle changes -     Magnesium  Hypothyroidism, unspecified type continue medications the same pending lab results  reminded to take on an empty stomach 30-22mins before food.  check TSH level -     TSH  Osteoarthritis, unspecified osteoarthritis type, unspecified site Intermittent; managed by OTC analgesics; established with orthopedics  CKD Stage 3 (GFR 36 ml/min) Increase fluids, avoid NSAIDS, monitor sugars, will monitor -     BASIC METABOLIC PANEL WITH GFR  Vitamin D deficiency At goal at recent check; continue to recommend supplementation for goal of 70-100 Defer vitamin D level  Upper airway cough syndrome Continue with acid reflux medications  Thrombocytopenia (H[facility]) -     CBC with Differential/Platelet  Prediabetes Discussed disease and risks Discussed diet/exercise, weight management  -     Hemoglobin A1c  Overweight (BMI 25.0-29.9) Long discussion about weight loss, diet, and exercise Recommended diet heavy in fruits and veggies and low in animal meats, cheeses, and dairy products, appropriate calorie intake Discussed appropriate weight for height  Follow up at next visit  Medication management -     CBC with Differential/Platelet -     BASIC METABOLIC PANEL WITH GFR -     Hepatic function panel  Mixed hyperlipidemia Continue  medications Continue low cholesterol diet and exercise.  Check lipid panel.  -     Lipid panel  Abdominal pain x2 days, ?constipation related, exam demonstrates mild tenderness to RUQ and LLQ without palpable abnormality Will draw routine labs (CBC, BMP/GFR, LFTs) and assess for abnormalities Bowel management discussed - try ^ fiber, fluids, can try fiber supplement, miralax, sennakot for constipation Let us know if symptoms are progressive or do not resolve in next few days   Colon cancer screening Refer to GI to set up repeat colonoscopy due 09/2017  Over 40 minutes of exam, counseling, chart review and critical decision making was performed Future Appointments  Date Time Provider [place]  10/10/2017 11:00 AM Unk Pinto, MD [facility] None  05/01/2018  9:00 AM Unk Pinto, MD [facility] None     Plan:   During the course of the visit the patient was educated and counseled about appropriate screening and preventive services including:    Pneumococcal vaccine   Prevnar 13  Influenza vaccine  Td vaccine  Screening electrocardiogram  Bone densitometry screening  Colorectal cancer screening  Diabetes screening  Glaucoma screening  Nutrition counseling   Advanced directives: requested   Subjective:  Jillian Rodgers Treena Cosman is a 72 y.o. female who presents for Medicare Annual Wellness Visit and 3 month follow up. Patient has GERD & failed trial of transition of Protonix to Ranitidine alone and currently is controlled on Protonix/am & Ranitidine /hs. She does endorse some mild abdominal pain ongoing since yesterday, 5/10 achy pain, generalized mid/lower - was somewhat constipated yesterday and feels may be related to this. Denies nausea/vomiting/diarrhea/hematochezia/melena.   BMI  is Body mass index is 27.42 kg/m., she has not been working on diet and exercise. Wt Readings from Last 3 Encounters:  07/09/17 156 lb (70.8 kg)  04/05/17 153 lb 9.6  oz (69.7 kg)  03/01/17 147 lb 3.2 oz (66.8 kg)    Her blood pressure has been controlled at home, today their BP is BP: 104/70 She does not workout. She denies chest pain, shortness of breath, dizziness.   She is on cholesterol medication (pravastatin 40 mg daily) and denies myalgias. Her cholesterol is at goal. The cholesterol last visit was:   Lab Results  Component Value Date   CHOL 175 04/05/2017   HDL 71 04/05/2017   LDLCALC 82 04/05/2017   TRIG 119 04/05/2017   CHOLHDL 2.5 04/05/2017    She has not been working on diet and exercise for prediabetes, and denies foot ulcerations, increased appetite, nausea, paresthesia of the feet, polydipsia, polyuria, visual disturbances, vomiting and weight loss. Last A1C in the office was:  Lab Results  Component Value Date   HGBA1C 5.7 (H) 04/05/2017   She is on thyroid medication. Her medication was not changed last visit.   Lab Results  Component Value Date   TSH 1.85 04/05/2017   Last GFR: Lab Results  Component Value Date   GFRNONAA 51 (L) 04/05/2017   Patient is on Vitamin D supplement and at goal at recent check:    Lab Results  Component Value Date   VD25OH 89 04/05/2017      Medication Review: Current Outpatient Medications on File Prior to Visit  Medication Sig Dispense Refill  . Ascorbic Acid (VITAMIN C) 500 MG CAPS Take 1 capsule by mouth daily.    [name] Kitchen BOTOX 100 units SOLR injection     . cetirizine (ZYRTEC) 10 MG tablet Take 10 mg by mouth daily.    . Cholecalciferol (VITAMIN D PO) Take 5,000 Units by mouth daily.    [name] Kitchen gabapentin (NEURONTIN) 600 MG tablet Take 600 mg by mouth 4 (four) times daily.     [name] Kitchen levothyroxine (SYNTHROID, LEVOTHROID) 50 MCG tablet TAKE 1 TABLET BY MOUTH EVERY MORNING ON AN EMPTY STOMACH 90 tablet 1  . OVER THE COUNTER MEDICATION Taking Tangy Tangerine tabs 2.0 # 2 tabs daily.    . pantoprazole (PROTONIX) 40 MG tablet Take 1 tablet (40 mg total) daily by mouth. 30 tablet 1  . pravastatin  (PRAVACHOL) 40 MG tablet TAKE 1 TABLET BY MOUTH AT BEDTIME 90 tablet 1  . ranitidine (ZANTAC) 300 MG tablet TAKE ONE TABLET BY MOUTH TWICE DAILY FOR ACID INDIGESTION AND REFLUX 180 tablet 3  . aspirin 81 MG tablet Take 81 mg by mouth daily.    [name] Kitchen erythromycin with ethanol (THERAMYCIN) 2 % external solution Apply topically daily. (Patient not taking: Reported on 07/09/2017) 60 mL 6   No current facility-administered medications on file prior to visit.     Not on File  Current Problems (verified) Patient Active Problem List   Diagnosis Date Noted  . Thrombocytopenia ([place]) 11/25/2015  . Encounter for Medicare annual wellness exam 06/14/2015  . Overweight (BMI 25.0-29.9) 01/28/2015  . CKD Stage 3 (GFR 36 ml/min) 01/19/2014  . Upper airway cough syndrome 12/16/2013  . Vitamin D deficiency 05/21/2013  . Prediabetes 05/21/2013  . Medication management 05/21/2013  . Essential hypertension 01/31/2013  . Degenerative joint disease   . GERD (gastroesophageal reflux disease)   . Hyperlipidemia   . Hypothyroidism     Screening Tests Immunization History  Administered  Date(s) Administered  . Influenza, High Dose Seasonal PF 12/29/2013, 01/15/2015, 02/01/2016, 02/28/2017  . Influenza-Unspecified 01/24/2011, 02/02/2012  . Pneumococcal Conjugate-13 10/28/2014  . Pneumococcal-Unspecified 11/28/2011  . Td 11/17/2010  . Tdap 11/12/2014  . Zoster 11/17/2010   Preventative care: Last colonoscopy: 2009 due in June - refer  Last mammogram: 2018 - does at GYN - Dr. Rogue Bussing, reports requested DEXA:03/2016 L fem T -1.6  Prior vaccinations: TD or Tdap: 2016  Influenza: 2018 Pneumococcal: 2013 Prevnar13: 2016 Shingles/Zostavax: 2016  Names of Other Physician/Practitioners you currently use: 1. [facility]- here for primary care 2. Dr. Gershon Crane, eye doctor, last visit 11/2016 3. Dr. Mathis Fare, dentist, last visit 2019  Patient Care Team: Unk Pinto, [name], [name]  SURGICAL HISTORY She  has a past surgical history that includes Ventral hernia repair; Cholecystectomy (1996); Pilonidal cyst excision (1972); Tonsillectomy; Upper gi endoscopy; Colonoscopy (2009); Hernia repair (1985); and Hernia repair (2005 & 2009). FAMILY HISTORY Her family history includes Breast cancer in her sister; Cancer in her mother; Congestive Heart Failure in her father; Gout in her maternal aunt; Heart disease in her mother; Hypertension in her mother; Ovarian cancer in her mother; Stroke in her paternal uncle; Uterine cancer in her mother. SOCIAL HISTORY She  reports that she has never smoked. She has never used smokeless tobacco. She reports that she does not drink alcohol or use drugs.   MEDICARE WELLNESS OBJECTIVES: Physical activity: Current Exercise Habits: The patient does not participate in regular exercise at present, Exercise limited by: None identified Cardiac risk factors: Cardiac Risk Factors include: advanced age (>43men, >63 women);dyslipidemia;hypertension;sedentary lifestyle Depression/mood screen:   Depression screen [facility] 2/9 07/09/2017  Decreased Interest 0  Down, Depressed, Hopeless 0  PHQ - 2 Score 0    ADLs:  In your present state of health, do you have any difficulty performing the following activities: 07/09/2017 04/09/2017  Hearing? N N  Vision? N N  Difficulty concentrating or making decisions? N N  Walking or climbing stairs? N N  Dressing or bathing? N N  Doing errands, shopping? N N  Some recent data might be hidden     Cognitive Testing  Alert? Yes  Normal Appearance?Yes  Oriented to person? Yes  Place? Yes   Time? Yes  Recall of three objects?  Yes  Can perform simple calculations? Yes  Displays appropriate judgment?Yes  Can read the correct time from a watch face?Yes  EOL planning: Does Patient Have a Medical Advance  Directive?: No Would patient like information on creating a medical advance directive?: Yes (MAU/Ambulatory/Procedural Areas - Information given)  Review of Systems  Constitutional: Negative for malaise/fatigue and weight loss.  HENT: Negative for hearing loss and tinnitus.   Eyes: Negative for blurred vision and double vision.  Respiratory: Negative for cough, sputum production, shortness of breath and wheezing.   Cardiovascular: Negative for chest pain, palpitations, orthopnea, claudication, leg swelling and PND.  Gastrointestinal: Positive for abdominal pain and constipation. Negative for blood in stool, diarrhea, heartburn, melena, nausea and vomiting.  Genitourinary: Negative.   Musculoskeletal: Negative for falls, joint pain and myalgias.  Skin: Negative for rash.  Neurological: Negative for dizziness, tingling, sensory change, weakness and headaches.  Endo/Heme/Allergies: Negative for polydipsia.  Psychiatric/Behavioral: Negative.  Negative for depression, memory loss, substance abuse and suicidal ideas. The patient is not nervous/anxious and does not have insomnia.   All other systems reviewed and are  negative.    Objective:     Today's Vitals   07/09/17 1105  BP: 104/70  Pulse: (!) 59  Temp: (!) 97.4 F (36.3 C)  SpO2: 93%  Weight: 156 lb (70.8 kg)  Height: 5' 3.25" (1.607 m)  PainSc: 5   PainLoc: Abdomen   Body mass index is 27.42 kg/m.  General appearance: alert, no distress, WD/WN, female HEENT: normocephalic, sclerae anicteric, TMs pearly, nares patent, no discharge or erythema, pharynx normal Oral cavity: MMM, no lesions Neck: supple, no lymphadenopathy, no thyromegaly, no masses Heart: RRR, normal S1, S2, no murmurs Lungs: CTA bilaterally, no wheezes, rhonchi, or rales Abdomen: +bs, soft, mildly tender RUQ and LLQ, no guarding, non distended, no masses, no hepatomegaly, no splenomegaly Musculoskeletal: nontender, no swelling, no obvious  deformity Extremities: no edema, no cyanosis, no clubbing Pulses: 2+ symmetric, upper and lower extremities, normal cap refill Neurological: alert, oriented x 3, CN2-12 intact, strength normal upper extremities and lower extremities, sensation normal throughout, DTRs 2+ throughout, no cerebellar signs, gait normal Psychiatric: normal affect, behavior normal, pleasant   Medicare Attestation I have personally reviewed: The patient's medical and social history Their use of alcohol, tobacco or illicit drugs Their current medications and supplements The patient's functional ability including ADLs,fall risks, home safety risks, cognitive, and hearing and visual impairment Diet and physical activities Evidence for depression or mood disorders  The patient's weight, height, BMI, and visual acuity have been recorded in the chart.  I have made referrals, counseling, and provided education to the patient based on review of the above and I have provided the patient with a written personalized care plan for preventive services.     Izora Ribas, NP   07/09/2017

## 2017-07-09 ENCOUNTER — Ambulatory Visit: Payer: Medicare Other | Admitting: Adult Health

## 2017-07-09 ENCOUNTER — Encounter: Payer: Self-pay | Admitting: Adult Health

## 2017-07-09 VITALS — BP 104/70 | HR 59 | Temp 97.4°F | Ht 63.25 in | Wt 156.0 lb

## 2017-07-09 DIAGNOSIS — R6889 Other general symptoms and signs: Secondary | ICD-10-CM

## 2017-07-09 DIAGNOSIS — R058 Other specified cough: Secondary | ICD-10-CM

## 2017-07-09 DIAGNOSIS — Z0001 Encounter for general adult medical examination with abnormal findings: Secondary | ICD-10-CM

## 2017-07-09 DIAGNOSIS — E039 Hypothyroidism, unspecified: Secondary | ICD-10-CM

## 2017-07-09 DIAGNOSIS — E559 Vitamin D deficiency, unspecified: Secondary | ICD-10-CM

## 2017-07-09 DIAGNOSIS — N183 Chronic kidney disease, stage 3 unspecified: Secondary | ICD-10-CM

## 2017-07-09 DIAGNOSIS — K219 Gastro-esophageal reflux disease without esophagitis: Secondary | ICD-10-CM | POA: Diagnosis not present

## 2017-07-09 DIAGNOSIS — M1990 Unspecified osteoarthritis, unspecified site: Secondary | ICD-10-CM | POA: Diagnosis not present

## 2017-07-09 DIAGNOSIS — Z0000 Encounter for general adult medical examination without abnormal findings: Secondary | ICD-10-CM

## 2017-07-09 DIAGNOSIS — R05 Cough: Secondary | ICD-10-CM | POA: Diagnosis not present

## 2017-07-09 DIAGNOSIS — I10 Essential (primary) hypertension: Secondary | ICD-10-CM

## 2017-07-09 DIAGNOSIS — D696 Thrombocytopenia, unspecified: Secondary | ICD-10-CM

## 2017-07-09 DIAGNOSIS — E663 Overweight: Secondary | ICD-10-CM | POA: Diagnosis not present

## 2017-07-09 DIAGNOSIS — Z79899 Other long term (current) drug therapy: Secondary | ICD-10-CM | POA: Diagnosis not present

## 2017-07-09 DIAGNOSIS — R7303 Prediabetes: Secondary | ICD-10-CM | POA: Diagnosis not present

## 2017-07-09 DIAGNOSIS — E782 Mixed hyperlipidemia: Secondary | ICD-10-CM

## 2017-07-09 DIAGNOSIS — Z1211 Encounter for screening for malignant neoplasm of colon: Secondary | ICD-10-CM

## 2017-07-09 MED ORDER — PANTOPRAZOLE SODIUM 40 MG PO TBEC: 40.0000 mg | DELAYED_RELEASE_TABLET | Freq: Every day | ORAL | 1 refills | Status: DC

## 2017-07-09 NOTE — Patient Instructions (Addendum)
Miralax, sennakot over the counter constipation medications  Tumeric + bioprene supplement daily for anti-inflammatory - safe for kidneys, for arthritis  Aim for 7+ servings of fruits and vegetables daily  80+ fluid ounces of water or unsweet tea for healthy kidneys  Limit alcohol intake  Limit animal fats in diet for cholesterol and heart health - choose grass fed whenever available  Aim for low stress - take time to unwind and care for your mental health  Aim for 150 min of moderate intensity exercise weekly for heart health, and weights twice weekly for bone health  Aim for 7-9 hours of sleep daily       When it comes to diets, agreement about the perfect plan isn't easy to find, even among the experts. Experts at the [employer] developed an idea known as the Healthy Eating Plate. Just imagine a plate divided into logical, healthy portions.  The emphasis is on diet quality:  Load up on vegetables and fruits - one-half of your plate: Aim for color and variety, and remember that potatoes don't count.  Go for whole grains - one-quarter of your plate: Whole wheat, barley, wheat berries, quinoa, oats, brown rice, and foods made with them. If you want pasta, go with whole wheat pasta.  Protein power - one-quarter of your plate: Fish, chicken, beans, and nuts are all healthy, versatile protein sources. Limit red meat.  The diet, however, does go beyond the plate, offering a few other suggestions.  Use healthy plant oils, such as olive, canola, soy, corn, sunflower and peanut. Check the labels, and avoid partially hydrogenated oil, which have unhealthy trans fats.  If you're thirsty, drink water. Coffee and tea are good in moderation, but skip sugary drinks and limit milk and dairy products to one or two daily servings.  The type of carbohydrate in the diet is more important than the amount. Some sources of carbohydrates, such as vegetables, fruits, whole  grains, and beans-are healthier than others.  Finally, stay active.

## 2017-07-10 ENCOUNTER — Other Ambulatory Visit: Payer: Self-pay | Admitting: Nephrology

## 2017-07-10 DIAGNOSIS — R42 Dizziness and giddiness: Secondary | ICD-10-CM

## 2017-07-10 DIAGNOSIS — N183 Chronic kidney disease, stage 3 unspecified: Secondary | ICD-10-CM

## 2017-07-10 DIAGNOSIS — N281 Cyst of kidney, acquired: Secondary | ICD-10-CM

## 2017-07-10 DIAGNOSIS — I159 Secondary hypertension, unspecified: Secondary | ICD-10-CM

## 2017-07-10 DIAGNOSIS — Z6827 Body mass index (BMI) 27.0-27.9, adult: Secondary | ICD-10-CM

## 2017-07-10 LAB — CBC WITH DIFFERENTIAL/PLATELET
Basophils Absolute: 46 cells/uL (ref 0–200)
Basophils Relative: 0.5 %
Eosinophils Absolute: 175 cells/uL (ref 15–500)
Eosinophils Relative: 1.9 %
HCT: 40.3 % (ref 35.0–45.0)
Hemoglobin: 13.8 g/dL (ref 11.7–15.5)
Lymphs Abs: 1665 cells/uL (ref 850–3900)
MCH: 29.6 pg (ref 27.0–33.0)
MCHC: 34.2 g/dL (ref 32.0–36.0)
MCV: 86.5 fL (ref 80.0–100.0)
MPV: 11.1 fL (ref 7.5–12.5)
Monocytes Relative: 6.9 %
Neutro Abs: 6679 cells/uL (ref 1500–7800)
Neutrophils Relative %: 72.6 %
Platelets: 87 10*3/uL — ABNORMAL LOW (ref 140–400)
RBC: 4.66 10*6/uL (ref 3.80–5.10)
RDW: 12.4 % (ref 11.0–15.0)
Total Lymphocyte: 18.1 %
WBC mixed population: 635 cells/uL (ref 200–950)
WBC: 9.2 10*3/uL (ref 3.8–10.8)

## 2017-07-10 LAB — HEPATIC FUNCTION PANEL
AG Ratio: 1.6 (calc) (ref 1.0–2.5)
ALT: 13 U/L (ref 6–29)
AST: 18 U/L (ref 10–35)
Albumin: 4.2 g/dL (ref 3.6–5.1)
Alkaline phosphatase (APISO): 79 U/L (ref 33–130)
Bilirubin, Direct: 0.1 mg/dL (ref 0.0–0.2)
Globulin: 2.6 g/dL (calc) (ref 1.9–3.7)
Indirect Bilirubin: 0.4 mg/dL (calc) (ref 0.2–1.2)
Total Bilirubin: 0.5 mg/dL (ref 0.2–1.2)
Total Protein: 6.8 g/dL (ref 6.1–8.1)

## 2017-07-10 LAB — BASIC METABOLIC PANEL WITH GFR
BUN/Creatinine Ratio: 21 (calc) (ref 6–22)
BUN: 26 mg/dL — ABNORMAL HIGH (ref 7–25)
CO2: 28 mmol/L (ref 20–32)
Calcium: 9.9 mg/dL (ref 8.6–10.4)
Chloride: 106 mmol/L (ref 98–110)
Creat: 1.25 mg/dL — ABNORMAL HIGH (ref 0.60–0.93)
GFR, Est African American: 49 mL/min/{1.73_m2} — ABNORMAL LOW (ref >=60)
GFR, Est Non African American: 43 mL/min/{1.73_m2} — ABNORMAL LOW (ref >=60)
Glucose, Bld: 80 mg/dL (ref 65–99)
Potassium: 4.2 mmol/L (ref 3.5–5.3)
Sodium: 141 mmol/L (ref 135–146)

## 2017-07-10 LAB — HEMOGLOBIN A1C
Hgb A1c MFr Bld: 5.7 % of total Hgb — ABNORMAL HIGH (ref <5.7)
Mean Plasma Glucose: 117 (calc)
eAG (mmol/L): 6.5 (calc)

## 2017-07-10 LAB — LIPID PANEL
Cholesterol: 189 mg/dL (ref <200)
HDL: 71 mg/dL (ref >50)
LDL Cholesterol (Calc): 87 mg/dL (calc)
Non-HDL Cholesterol (Calc): 118 mg/dL (calc) (ref <130)
Total CHOL/HDL Ratio: 2.7 (calc) (ref <5.0)
Triglycerides: 212 mg/dL — ABNORMAL HIGH (ref <150)

## 2017-07-10 LAB — MAGNESIUM: Magnesium: 2.1 mg/dL (ref 1.5–2.5)

## 2017-07-10 LAB — TSH: TSH: 1.21 mIU/L (ref 0.40–4.50)

## 2017-07-17 ENCOUNTER — Ambulatory Visit
Admission: RE | Admit: 2017-07-17 | Discharge: 2017-07-17 | Disposition: A | Discharge status: Home / Self Care | Payer: Medicare Other | Source: Ambulatory Visit | Attending: Nephrology | Admitting: Nephrology

## 2017-07-17 DIAGNOSIS — N183 Chronic kidney disease, stage 3 unspecified: Secondary | ICD-10-CM

## 2017-07-17 DIAGNOSIS — R42 Dizziness and giddiness: Secondary | ICD-10-CM

## 2017-07-17 DIAGNOSIS — N281 Cyst of kidney, acquired: Secondary | ICD-10-CM

## 2017-07-17 DIAGNOSIS — I159 Secondary hypertension, unspecified: Secondary | ICD-10-CM

## 2017-07-17 DIAGNOSIS — Z6827 Body mass index (BMI) 27.0-27.9, adult: Secondary | ICD-10-CM

## 2017-07-23 ENCOUNTER — Other Ambulatory Visit: Payer: Self-pay | Admitting: Nephrology

## 2017-07-23 DIAGNOSIS — N281 Cyst of kidney, acquired: Secondary | ICD-10-CM

## 2017-08-16 ENCOUNTER — Ambulatory Visit
Admission: RE | Admit: 2017-08-16 | Discharge: 2017-08-16 | Disposition: A | Discharge status: Home / Self Care | Payer: Medicare Other | Source: Ambulatory Visit | Attending: Nephrology | Admitting: Nephrology

## 2017-08-16 DIAGNOSIS — N281 Cyst of kidney, acquired: Secondary | ICD-10-CM

## 2017-08-30 ENCOUNTER — Other Ambulatory Visit: Payer: Self-pay | Admitting: Internal Medicine

## 2017-09-03 ENCOUNTER — Other Ambulatory Visit: Payer: Self-pay | Admitting: Internal Medicine

## 2017-09-11 ENCOUNTER — Encounter: Payer: Self-pay | Admitting: Adult Health

## 2017-10-10 ENCOUNTER — Ambulatory Visit: Payer: Self-pay | Admitting: Internal Medicine

## 2017-10-15 ENCOUNTER — Ambulatory Visit: Payer: Medicare Other | Admitting: Internal Medicine

## 2017-10-15 ENCOUNTER — Encounter: Payer: Self-pay | Admitting: Internal Medicine

## 2017-10-15 ENCOUNTER — Other Ambulatory Visit: Payer: Self-pay | Admitting: Internal Medicine

## 2017-10-15 VITALS — BP 128/84 | HR 68 | Temp 97.0°F | Resp 16 | Ht 63.25 in | Wt 161.8 lb

## 2017-10-15 DIAGNOSIS — E782 Mixed hyperlipidemia: Secondary | ICD-10-CM | POA: Diagnosis not present

## 2017-10-15 DIAGNOSIS — R7303 Prediabetes: Secondary | ICD-10-CM

## 2017-10-15 DIAGNOSIS — E559 Vitamin D deficiency, unspecified: Secondary | ICD-10-CM

## 2017-10-15 DIAGNOSIS — R7309 Other abnormal glucose: Secondary | ICD-10-CM | POA: Diagnosis not present

## 2017-10-15 DIAGNOSIS — K219 Gastro-esophageal reflux disease without esophagitis: Secondary | ICD-10-CM

## 2017-10-15 DIAGNOSIS — I10 Essential (primary) hypertension: Secondary | ICD-10-CM

## 2017-10-15 DIAGNOSIS — Z79899 Other long term (current) drug therapy: Secondary | ICD-10-CM

## 2017-10-15 DIAGNOSIS — K589 Irritable bowel syndrome without diarrhea: Secondary | ICD-10-CM

## 2017-10-15 DIAGNOSIS — E039 Hypothyroidism, unspecified: Secondary | ICD-10-CM

## 2017-10-15 MED ORDER — AZITHROMYCIN 250 MG PO TABS: ORAL_TABLET | ORAL | 1 refills | Status: DC

## 2017-10-15 MED ORDER — HYOSCYAMINE SULFATE 0.125 MG SL SUBL: SUBLINGUAL_TABLET | SUBLINGUAL | 3 refills | Status: DC

## 2017-10-15 MED ORDER — PREDNISONE 20 MG PO TABS: ORAL_TABLET | ORAL | 0 refills | Status: DC

## 2017-10-15 NOTE — Progress Notes (Signed)
Patient ID: Jillian Rodgers, female   DOB: Dec 25, 1945, 72 y.o.   MRN: [id number]      This very nice 72 y.o. MWF presents for 6 month follow up with  Labile HTN, HLD, Hypothyroidism, Pre-Diabetes and Vitamin D Deficiency. Patient has hx/o GERD controlled on current regimen with Pantoprazole /Ranitidine.     Patient has been followed expectantly with labile HTN (1990's) & BP has been controlled at home. Today's BP is at goal - 128/84. Patient has had no complaints of any cardiac type chest pain, palpitations, dyspnea / orthopnea / PND, dizziness, claudication, or dependent edema.     Hyperlipidemia is controlled with diet & meds. Patient denies myalgias or other med SE's. Last Lipids were at goal: Lab Results  Component Value Date   CHOL 176 10/15/2017   HDL 63 10/15/2017   LDLCALC 89 10/15/2017   TRIG 140 10/15/2017   CHOLHDL 2.8 10/15/2017      Also, the patient is overweight (BMI 28+) and has history of PreDiabetes  (A1c 6.0% / 2012).   She has had no symptoms of reactive hypoglycemia, diabetic polys, paresthesias or visual blurring.  Last A1c was Normal & at goal: Lab Results  Component Value Date   HGBA1C 5.5 10/15/2017      Further, the patient also has history of Vitamin D Deficiency ("39"/2012)   and supplements vitamin D without any suspected side-effects. Last vitamin D was at goal: Lab Results  Component Value Date   VD25OH 86 10/15/2017   Current Outpatient Medications on File Prior to Visit  Medication Sig  . Ascorbic Acid (VITAMIN C) 500 MG CAPS Take 1 capsule by mouth daily.  [name] Kitchen aspirin 81 MG tablet Take 81 mg by mouth daily.  [name] Kitchen BOTOX 100 units SOLR injection   . cetirizine (ZYRTEC) 10 MG tablet Take 10 mg by mouth daily.  . Cholecalciferol (VITAMIN D PO) Take 5,000 Units by mouth daily.  [name] Kitchen gabapentin (NEURONTIN) 600 MG tablet Take 600 mg by mouth 4 (four) times daily.   [name] Kitchen levothyroxine (SYNTHROID, LEVOTHROID) 50 MCG tablet TAKE 1 TABLET BY MOUTH EVERY MORNING  ON AN EMPTY STOMACH  . OVER THE COUNTER MEDICATION Taking Tangy Tangerine tabs 2.0 # 2 tabs daily.  . pantoprazole (PROTONIX) 40 MG tablet Take 1 tablet (40 mg total) by mouth daily.  . pravastatin (PRAVACHOL) 40 MG tablet TAKE 1 TABLET BY MOUTH AT BEDTIME  . ranitidine (ZANTAC) 300 MG tablet TAKE ONE TABLET BY MOUTH TWICE DAILY FOR ACID INDIGESTION AND REFLUX   No current facility-administered medications on file prior to visit.    NKA  PMHx:   Past Medical History:  Diagnosis Date  . Degenerative joint disease   . Diabetes mellitus without complication ([place])   . GERD (gastroesophageal reflux disease)   . Hyperlipidemia   . Hypertension   . Migraines    neurontin helps  . Pre-diabetes   . Thyroid disease    Immunization History  Administered Date(s) Administered  . Influenza, High Dose Seasonal PF 12/29/2013, 01/15/2015, 02/01/2016, 02/28/2017  . Influenza-Unspecified 01/24/2011, 02/02/2012  . Pneumococcal Conjugate-13 10/28/2014  . Pneumococcal-Unspecified 11/28/2011  . Td 11/17/2010  . Tdap 11/12/2014  . Zoster 11/17/2010   Past Surgical History:  Procedure Laterality Date  . CHOLECYSTECTOMY  1996   open  . COLONOSCOPY  2009   recommended 10 year f/u  . HERNIA REPAIR  1985   umb hernia rpr  . HERNIA REPAIR  2005 & 2009   ventral hernia  rpr  . [place]  . TONSILLECTOMY    . UPPER GI ENDOSCOPY    . VENTRAL HERNIA REPAIR     FHx:    Reviewed / unchanged  SHx:    Reviewed / unchanged   Systems Review:  Constitutional: Denies fever, chills, wt changes, headaches, insomnia, fatigue, night sweats, change in appetite. Eyes: Denies redness, blurred vision, diplopia, discharge, itchy, watery eyes.  ENT: Denies discharge, congestion, post nasal drip, epistaxis, sore throat, earache, hearing loss, dental pain, tinnitus, vertigo, sinus pain, snoring.  CV: Denies chest pain, palpitations, irregular heartbeat, syncope, dyspnea, diaphoresis, orthopnea,  PND, claudication or edema. Respiratory: denies cough, dyspnea, DOE, pleurisy, hoarseness, laryngitis, wheezing.  Gastrointestinal: Denies dysphagia, odynophagia, heartburn, reflux, water brash, abdominal pain or cramps, nausea, vomiting, bloating, diarrhea, constipation, hematemesis, melena, hematochezia  or hemorrhoids. Genitourinary: Denies dysuria, frequency, urgency, nocturia, hesitancy, discharge, hematuria or flank pain. Musculoskeletal: Denies arthralgias, myalgias, stiffness, jt. swelling, pain, limping or strain/sprain.  Skin: Denies pruritus, rash, hives, warts, acne, eczema or change in skin lesion(s). Neuro: No weakness, tremor, incoordination, spasms, paresthesia or pain. Psychiatric: Denies confusion, memory loss or sensory loss. Endo: Denies change in weight, skin or hair change.  Heme/Lymph: No excessive bleeding, bruising or enlarged lymph nodes.  Physical Exam  BP 128/84   Pulse 68   Temp (!) 97 F (36.1 C)   Resp 16   Ht 5' 3.25" (1.607 m)   Wt 161 lb 12.8 oz (73.4 kg)   BMI 28.44 kg/m   Appears  well nourished, well groomed  and in no distress.  Eyes: PERRLA, EOMs, conjunctiva no swelling or erythema. Sinuses: No frontal/maxillary tenderness ENT/Mouth: EAC's clear, TM's nl w/o erythema, bulging. Nares clear w/o erythema, swelling, exudates. Oropharynx clear without erythema or exudates. Oral hygiene is good. Tongue normal, non obstructing. Hearing intact.  Neck: Supple. Thyroid not palpable. Car 2+/2+ without bruits, nodes or JVD. Chest: Respirations nl with BS clear & equal w/o rales, rhonchi, wheezing or stridor.  Cor: Heart sounds normal w/ regular rate and rhythm without sig. murmurs, gallops, clicks or rubs. Peripheral pulses normal and equal  without edema.  Abdomen: Soft & bowel sounds normal. Non-tender w/o guarding, rebound, hernias, masses or organomegaly.  Lymphatics: Unremarkable.  Musculoskeletal: Full ROM all peripheral extremities, joint stability,  5/5 strength and normal gait.  Skin: Warm, dry without exposed rashes, lesions or ecchymosis apparent.  Neuro: Cranial nerves intact, reflexes equal bilaterally. Sensory-motor testing grossly intact. Tendon reflexes grossly intact.  Pysch: Alert & oriented x 3.  Insight and judgement nl & appropriate. No ideations.  Assessment and Plan:  1. Essential hypertension  - Continue medication, monitor blood pressure at home.  - Continue DASH diet.  Reminder to go to the ER if any CP,  SOB, nausea, dizziness, severe HA, changes vision/speech.  - CBC with Differential/Platelet - COMPLETE METABOLIC PANEL WITH GFR - Magnesium - TSH  2. Hyperlipidemia, mixed  - Continue diet/meds, exercise,& lifestyle modifications.  - Continue monitor periodic cholesterol/liver & renal functions   - Lipid panel - TSH  3. Abnormal glucose  - Continue diet, exercise, lifestyle modifications.  - Monitor appropriate labs.  - Hemoglobin A1c - Insulin, random  4. Vitamin D deficiency  - Continue supplementation.   - VITAMIN D 25 Hydroxyl  5. Prediabetes  - Hemoglobin A1c - Insulin, random  6. Gastroesophageal reflux disease  - CBC with Differential/Platelet  7. Hypothyroidism  - TSH  8. Medication management  - CBC with Differential/Platelet -  COMPLETE METABOLIC PANEL WITH GFR - Magnesium - Lipid panel - TSH - Hemoglobin A1c - Insulin, random - VITAMIN D 25 Hydroxy l  9. Irritable bowel syndrome        Discussed  regular exercise, BP monitoring, weight control to achieve/maintain BMI less than 25 and discussed med and SE's. Recommended labs to assess and monitor clinical status with further disposition pending results of labs. Over 30 minutes of exam, counseling, chart review was performed.

## 2017-10-15 NOTE — Patient Instructions (Signed)

## 2017-10-16 ENCOUNTER — Other Ambulatory Visit: Payer: Self-pay | Admitting: Internal Medicine

## 2017-10-16 DIAGNOSIS — N289 Disorder of kidney and ureter, unspecified: Secondary | ICD-10-CM

## 2017-10-16 LAB — INSULIN, RANDOM: Insulin: 8.1 u[IU]/mL (ref 2.0–19.6)

## 2017-10-16 LAB — CBC WITH DIFFERENTIAL/PLATELET
Basophils Absolute: 33 cells/uL (ref 0–200)
Basophils Relative: 0.5 %
Eosinophils Absolute: 158 cells/uL (ref 15–500)
Eosinophils Relative: 2.4 %
HCT: 38.1 % (ref 35.0–45.0)
Hemoglobin: 12.8 g/dL (ref 11.7–15.5)
Lymphs Abs: 1709 cells/uL (ref 850–3900)
MCH: 30 pg (ref 27.0–33.0)
MCHC: 33.6 g/dL (ref 32.0–36.0)
MCV: 89.4 fL (ref 80.0–100.0)
MPV: 11 fL (ref 7.5–12.5)
Monocytes Relative: 8.4 %
Neutro Abs: 4145 cells/uL (ref 1500–7800)
Neutrophils Relative %: 62.8 %
Platelets: 90 10*3/uL — ABNORMAL LOW (ref 140–400)
RBC: 4.26 10*6/uL (ref 3.80–5.10)
RDW: 12.6 % (ref 11.0–15.0)
Total Lymphocyte: 25.9 %
WBC mixed population: 554 cells/uL (ref 200–950)
WBC: 6.6 10*3/uL (ref 3.8–10.8)

## 2017-10-16 LAB — HEMOGLOBIN A1C
Hgb A1c MFr Bld: 5.5 % of total Hgb (ref <5.7)
Mean Plasma Glucose: 111 (calc)
eAG (mmol/L): 6.2 (calc)

## 2017-10-16 LAB — COMPLETE METABOLIC PANEL WITH GFR
AG Ratio: 1.8 (calc) (ref 1.0–2.5)
ALT: 16 U/L (ref 6–29)
AST: 24 U/L (ref 10–35)
Albumin: 4.2 g/dL (ref 3.6–5.1)
Alkaline phosphatase (APISO): 84 U/L (ref 33–130)
BUN/Creatinine Ratio: 17 (calc) (ref 6–22)
BUN: 22 mg/dL (ref 7–25)
CO2: 30 mmol/L (ref 20–32)
Calcium: 9.9 mg/dL (ref 8.6–10.4)
Chloride: 105 mmol/L (ref 98–110)
Creat: 1.33 mg/dL — ABNORMAL HIGH (ref 0.60–0.93)
GFR, Est African American: 46 mL/min/{1.73_m2} — ABNORMAL LOW (ref >=60)
GFR, Est Non African American: 40 mL/min/{1.73_m2} — ABNORMAL LOW (ref >=60)
Globulin: 2.4 g/dL (calc) (ref 1.9–3.7)
Glucose, Bld: 88 mg/dL (ref 65–99)
Potassium: 5.3 mmol/L (ref 3.5–5.3)
Sodium: 139 mmol/L (ref 135–146)
Total Bilirubin: 0.8 mg/dL (ref 0.2–1.2)
Total Protein: 6.6 g/dL (ref 6.1–8.1)

## 2017-10-16 LAB — LIPID PANEL
Cholesterol: 176 mg/dL (ref <200)
HDL: 63 mg/dL (ref >50)
LDL Cholesterol (Calc): 89 mg/dL (calc)
Non-HDL Cholesterol (Calc): 113 mg/dL (calc) (ref <130)
Total CHOL/HDL Ratio: 2.8 (calc) (ref <5.0)
Triglycerides: 140 mg/dL (ref <150)

## 2017-10-16 LAB — MAGNESIUM: Magnesium: 2.2 mg/dL (ref 1.5–2.5)

## 2017-10-16 LAB — TSH: TSH: 1.23 mIU/L (ref 0.40–4.50)

## 2017-10-16 LAB — VITAMIN D 25 HYDROXY (VIT D DEFICIENCY, FRACTURES): Vit D, 25-Hydroxy: 86 ng/mL (ref 30–100)

## 2017-10-20 ENCOUNTER — Encounter: Payer: Self-pay | Admitting: Internal Medicine

## 2017-11-18 ENCOUNTER — Other Ambulatory Visit: Payer: Self-pay | Admitting: Adult Health

## 2017-11-21 ENCOUNTER — Ambulatory Visit: Payer: Medicare Other | Admitting: Internal Medicine

## 2017-11-21 ENCOUNTER — Other Ambulatory Visit: Payer: Self-pay | Admitting: Internal Medicine

## 2017-11-21 DIAGNOSIS — N289 Disorder of kidney and ureter, unspecified: Secondary | ICD-10-CM | POA: Diagnosis not present

## 2017-11-21 LAB — BASIC METABOLIC PANEL WITH GFR
BUN/Creatinine Ratio: 22 (calc) (ref 6–22)
BUN: 28 mg/dL — ABNORMAL HIGH (ref 7–25)
CO2: 25 mmol/L (ref 20–32)
Calcium: 9.7 mg/dL (ref 8.6–10.4)
Chloride: 108 mmol/L (ref 98–110)
Creat: 1.3 mg/dL — ABNORMAL HIGH (ref 0.60–0.93)
GFR, Est African American: 47 mL/min/{1.73_m2} — ABNORMAL LOW (ref >=60)
GFR, Est Non African American: 41 mL/min/{1.73_m2} — ABNORMAL LOW (ref >=60)
Glucose, Bld: 78 mg/dL (ref 65–99)
Potassium: 5 mmol/L (ref 3.5–5.3)
Sodium: 141 mmol/L (ref 135–146)

## 2017-11-21 NOTE — Progress Notes (Signed)
Patient presents to the office for a nurse visit to have labs done to check renal function. Patient states that she had recently been traveling and seemed to have drank quite a bit of water but really unsure about the amount.

## 2017-11-22 ENCOUNTER — Ambulatory Visit: Payer: Self-pay

## 2017-12-18 ENCOUNTER — Other Ambulatory Visit: Payer: Self-pay | Admitting: Adult Health

## 2017-12-18 DIAGNOSIS — K219 Gastro-esophageal reflux disease without esophagitis: Secondary | ICD-10-CM

## 2017-12-21 ENCOUNTER — Telehealth: Payer: Self-pay | Admitting: *Deleted

## 2017-12-21 NOTE — Telephone Encounter (Signed)
A message was left with the patient's spouse, to remind her to return the cologuard test.

## 2017-12-26 ENCOUNTER — Ambulatory Visit: Payer: Medicare Other

## 2017-12-26 DIAGNOSIS — N289 Disorder of kidney and ureter, unspecified: Secondary | ICD-10-CM | POA: Diagnosis not present

## 2017-12-26 LAB — BASIC METABOLIC PANEL WITH GFR
BUN/Creatinine Ratio: 26 (calc) — ABNORMAL HIGH (ref 6–22)
BUN: 31 mg/dL — ABNORMAL HIGH (ref 7–25)
CO2: 27 mmol/L (ref 20–32)
Calcium: 9.7 mg/dL (ref 8.6–10.4)
Chloride: 106 mmol/L (ref 98–110)
Creat: 1.17 mg/dL — ABNORMAL HIGH (ref 0.60–0.93)
GFR, Est African American: 54 mL/min/{1.73_m2} — ABNORMAL LOW (ref >=60)
GFR, Est Non African American: 46 mL/min/{1.73_m2} — ABNORMAL LOW (ref >=60)
Glucose, Bld: 53 mg/dL — ABNORMAL LOW (ref 65–99)
Potassium: 4 mmol/L (ref 3.5–5.3)
Sodium: 141 mmol/L (ref 135–146)

## 2017-12-26 MED ORDER — ERYTHROMYCIN 2 % EX SOLN: CUTANEOUS | 0 refills | Status: DC | PRN

## 2017-12-26 NOTE — Progress Notes (Signed)
Patient presents to the office for a nurse visit to have labs done. Patient states that there aren't any changes with medication. Asked if patient was drinking at least 6 bottle of water daily and she stated no. She has a difficulty time getting them down.  Patient asking for a refill on Erythromycin topical solution.

## 2018-01-09 LAB — COLOGUARD: Cologuard: NEGATIVE

## 2018-01-15 ENCOUNTER — Encounter: Payer: Self-pay | Admitting: *Deleted

## 2018-01-23 NOTE — Progress Notes (Signed)
Patient ID: Jillian Rodgers, female   DOB: 15-May-1945, 72 y.o.   MRN: [id number]  FOLLOW UP  Assessment:   Hypothyroidism, unspecified hypothyroidism type -cont levothyroxine - TSH  Essential hypertension - continue medications, DASH diet, exercise and monitor at home. Call if greater than 130/80.   Hyperlipidemia -cont diet and exercise -cont meds -monitor  Vitamin D deficiency -cont supplement   Medication management - CBC with Differential/Platelet - BASIC METABOLIC PANEL WITH GFR - Hepatic function panel  Thrombocytopenia ([facility]) -     CBC with Differential/Platelet - monitor  Epigastric pain Will refer to GI for EGD- need to rule out Hpylori, ulcers, gastroparesis Continue protonix, zantac, and will add on carafate No NSAIDS, no ETOH   Over 30 minutes of exam, counseling, chart review, and critical decision making was performed  Future Appointments  Date Time Provider [place]  05/01/2018  9:00 AM Unk Pinto, MD [facility] None  07/23/2018 10:45 AM Liane Comber, NP [facility] None     Subjective:   Jillian Rodgers is a 72 y.o. female who presents for follow up for HTN, chol, CKD.   She is on protonix, has had upper GI pain x 1 month, she is burping, feels gassy, she is on protonix and zantac. She states she has decreased appetite. Does not get full quickly. Negative cologuard 01/09/2018. She has more constipation, hard stools.. S/p choley. She has moderate hiatal hernia on 12/25/2016 CT AB/Pelvis.   She will have occ chest pain, dull, well localized, occ with exertion. No SOB with it. Has had occ sweating randomly, not with exertion.   Her blood pressure has been controlled at home, today their BP is BP: 132/80 She does workout. She denies chest pain, shortness of breath.   She is on cholesterol medication and denies myalgias. Her cholesterol is at goal. The cholesterol last visit was:   Lab Results  Component Value Date    CHOL 176 10/15/2017   HDL 63 10/15/2017   LDLCALC 89 10/15/2017   TRIG 140 10/15/2017   CHOLHDL 2.8 10/15/2017   She has been working on diet and exercise for prediabetes, and denies foot ulcerations, hyperglycemia, hypoglycemia , increased appetite, nausea, paresthesia of the feet, polydipsia, polyuria, visual disturbances, vomiting and weight loss. Last A1C in the office was:  Lab Results  Component Value Date   HGBA1C 5.5 10/15/2017   Last GFR  Lab Results  Component Value Date   GFRNONAA 46 (L) 12/26/2017   Patient is on Vitamin D supplement. Lab Results  Component Value Date   VD25OH 86 10/15/2017     She is on thyroid medication. Her medication was not changed last visit.   Lab Results  Component Value Date   TSH 1.23 10/15/2017  .  BMI is Body mass index is 28.68 kg/m., she is working on diet and exercise. Wt Readings from Last 3 Encounters:  01/24/18 163 lb 3.2 oz (74 kg)  10/15/17 161 lb 12.8 oz (73.4 kg)  07/09/17 156 lb (70.8 kg)     Medication Review Current Outpatient Medications on File Prior to Visit  Medication Sig Dispense Refill  . Ascorbic Acid (VITAMIN C) 500 MG CAPS Take 1 capsule by mouth daily.    [name] Kitchen aspirin 81 MG tablet Take 81 mg by mouth daily.    [name] Kitchen BOTOX 100 units SOLR injection     . cetirizine (ZYRTEC) 10 MG tablet Take 10 mg by mouth daily.    . Cholecalciferol (VITAMIN D PO)  Take 5,000 Units by mouth daily.    [name] Kitchen erythromycin with ethanol (THERAMYCIN) 2 % external solution Apply topically as needed. 60 mL 0  . gabapentin (NEURONTIN) 600 MG tablet Take 600 mg by mouth 4 (four) times daily.     . hyoscyamine (LEVSIN SL) 0.125 MG SL tablet DISSOLVE 1 TO 2 TABLETS UNDER THE TONGUE EVERY 4 HOURS AS NEEDED FOR NAUSEA OR CRAMPING OR DIARRHEA 1350 tablet 3  . levothyroxine (SYNTHROID, LEVOTHROID) 50 MCG tablet TAKE 1 TABLET BY MOUTH EVERY MORNING ON AN EMPTY STOMACH 90 tablet 1  . OVER THE COUNTER MEDICATION Taking Tangy Tangerine tabs 2.0 # 2  tabs daily.    . pantoprazole (PROTONIX) 40 MG tablet TAKE 1 TABLET(40 MG) BY MOUTH DAILY 90 tablet 0  . pravastatin (PRAVACHOL) 40 MG tablet TAKE 1 TABLET BY MOUTH AT BEDTIME 90 tablet 1  . ranitidine (ZANTAC) 300 MG tablet TAKE ONE TABLET BY MOUTH TWICE DAILY FOR ACID INDIGESTION AND REFLUX 180 tablet 3   No current facility-administered medications on file prior to visit.     Current Problems (verified) Patient Active Problem List   Diagnosis Date Noted  . Thrombocytopenia ([place]) 11/25/2015  . Encounter for Medicare annual wellness exam 06/14/2015  . Overweight (BMI 25.0-29.9) 01/28/2015  . CKD (chronic kidney disease) stage 3, GFR 30-59 ml/min ([facility]) 01/19/2014  . Upper airway cough syndrome 12/16/2013  . Vitamin D deficiency 05/21/2013  . Prediabetes 05/21/2013  . Medication management 05/21/2013  . Essential hypertension 01/31/2013  . Degenerative joint disease   . GERD (gastroesophageal reflux disease)   . Hyperlipidemia   . Hypothyroidism     Allergies No Known Allergies  Surgical History: reviewed and unchanged Family History: reviewed and unchanged Social History: reviewed and unchanged  Review of Systems  Constitutional: Negative for chills, diaphoresis, fever, malaise/fatigue and weight loss.  HENT: Negative for congestion, ear discharge, ear pain, hearing loss, nosebleeds, sinus pain, sore throat and tinnitus.   Respiratory: Negative for cough, shortness of breath, wheezing and stridor.   Cardiovascular: Negative.  Negative for chest pain and claudication.  Gastrointestinal: Positive for abdominal pain, constipation and heartburn. Negative for blood in stool, diarrhea, melena, nausea and vomiting.  Genitourinary: Negative.   Musculoskeletal: Positive for joint pain (knees and wrist, seeing ortho, doing better). Negative for falls.  Skin: Negative.  Negative for rash.  Neurological: Negative for dizziness, tingling, tremors, sensory change, speech change, focal  weakness, seizures, loss of consciousness and weakness.  Psychiatric/Behavioral: Negative.      Objective:   Today's Vitals   01/24/18 1024  BP: 132/80  Pulse: 67  Resp: 16  Temp: 98.4 F (36.9 C)  SpO2: 99%  Weight: 163 lb 3.2 oz (74 kg)  Height: 5' 3.25" (1.607 m)   Body mass index is 28.68 kg/m.  General appearance: alert, no distress, WD/WN,  female HEENT: normocephalic, sclerae anicteric, TMs pearly, nares patent, no discharge or erythema, pharynx normal Oral cavity: MMM, no lesions Neck: supple, no lymphadenopathy, no thyromegaly, no masses Heart: RRR, normal S1, S2, no murmurs Lungs: CTA bilaterally, no wheezes, rhonchi, or rales Abdomen: +bs, soft, + tender epigastric, non distended, no masses, no hepatomegaly, no splenomegaly Musculoskeletal: nontender, no swelling, no obvious deformity Extremities: no edema, no cyanosis, no clubbing Pulses: 2+ symmetric, upper and lower extremities, normal cap refill Neurological: alert, oriented x 3, CN2-12 intact, strength normal upper extremities and lower extremities, sensation normal throughout, DTRs 2+ throughout, no cerebellar signs, gait normal Psychiatric: normal affect, behavior normal,  pleasant    Vicie Mutters, PA-C   01/24/2018

## 2018-01-24 ENCOUNTER — Other Ambulatory Visit: Payer: Self-pay | Admitting: Physician Assistant

## 2018-01-24 ENCOUNTER — Encounter: Payer: Self-pay | Admitting: Physician Assistant

## 2018-01-24 ENCOUNTER — Ambulatory Visit: Payer: Medicare Other | Admitting: Physician Assistant

## 2018-01-24 VITALS — BP 132/80 | HR 67 | Temp 98.4°F | Resp 16 | Ht 63.25 in | Wt 163.2 lb

## 2018-01-24 DIAGNOSIS — D696 Thrombocytopenia, unspecified: Secondary | ICD-10-CM

## 2018-01-24 DIAGNOSIS — Z79899 Other long term (current) drug therapy: Secondary | ICD-10-CM

## 2018-01-24 DIAGNOSIS — E039 Hypothyroidism, unspecified: Secondary | ICD-10-CM | POA: Diagnosis not present

## 2018-01-24 DIAGNOSIS — E782 Mixed hyperlipidemia: Secondary | ICD-10-CM

## 2018-01-24 DIAGNOSIS — I10 Essential (primary) hypertension: Secondary | ICD-10-CM

## 2018-01-24 DIAGNOSIS — D649 Anemia, unspecified: Secondary | ICD-10-CM

## 2018-01-24 DIAGNOSIS — R7303 Prediabetes: Secondary | ICD-10-CM

## 2018-01-24 DIAGNOSIS — N183 Chronic kidney disease, stage 3 unspecified: Secondary | ICD-10-CM

## 2018-01-24 DIAGNOSIS — R1013 Epigastric pain: Secondary | ICD-10-CM

## 2018-01-24 DIAGNOSIS — E559 Vitamin D deficiency, unspecified: Secondary | ICD-10-CM

## 2018-01-24 MED ORDER — SUCRALFATE 1 G PO TABS: 1.0000 g | ORAL_TABLET | Freq: Three times a day (TID) | ORAL | 1 refills | Status: DC

## 2018-01-24 NOTE — Patient Instructions (Signed)
Being a woman you may not have the typical symptoms of a heart attack.  You may not have any pain OR you may have atypical pain such as jaw pain, upper back pain, arm pain, "my bra feels to tight" and you will often have symptoms with it like below.  Symptoms for a heart attack will likely occur when you exert your self or exercise and include: Shortness of breath Sweating Nausea Dizziness Fast or irregular heart beats Fatigue   It makes me feel better if my patients get their heart rate up with exercise once or twice a week and pay close attention to your body. If there is ANY change in your exercise capacity or if you have symptoms above, please STOP and call 911 or call to come to the office.   Here is some information to help you keep your heart healthy: Move it! - Aim for 30 mins of activity every day. Take it slowly at first. Talk to [place] before starting any new exercise program.   Lose it.  -Body Mass Index (BMI) can indicate if you need to lose weight. A healthy range is 18.5-24.9. For a BMI calculator, go to [email or web address]  Waist Management -Excess abdominal fat is a risk factor for heart disease, diabetes, asthma, stroke and more. Ideal waist circumference is less than 35" for women and less than 40" for men.   Eat Right -focus on fruits, vegetables, whole grains, and meals you make yourself. Avoid foods with trans fat and high sugar/sodium content.   Snooze or Snore? - Loud snoring can be a sign of sleep apnea, a significant risk factor for high blood pressure, heart attach, stroke, and heart arrhythmias.  Kick the habit -Quit Smoking! Avoid second hand smoke. A single cigarette raises your blood pressure for 20 mins and increases the risk of heart attack and stroke for the next 24 hours.   Are Aspirin and Supplements right for you? -Add ENTERIC COATED low dose 81 mg Aspirin daily OR can do every other day if you have easy bruising to protect your heart and head. As well as  to reduce risk of Colon Cancer by 20 %, Skin Cancer by 26 % , Melanoma by 46% and Pancreatic cancer by 60%  Say "No to Stress -There may be little you can do about problems that cause stress. However, techniques such as long walks, meditation, and exercise can help you manage it.   Start Now! - Make changes one at a time and set reasonable goals to increase your likelihood of success.     Abdominal Pain, Adult Abdominal pain can be caused by many things. Often, abdominal pain is not serious and it gets better with no treatment or by being treated at home. However, sometimes abdominal pain is serious. Your health care provider will do a medical history and a physical exam to try to determine the cause of your abdominal pain. Follow these instructions at home:  Take over-the-counter and prescription medicines only as told by your health care provider. Do not take a laxative unless told by your health care provider.  Drink enough fluid to keep your urine clear or pale yellow.  Watch your condition for any changes.  Keep all follow-up visits as told by your health care provider. This is important. Contact a health care provider if:  Your abdominal pain changes or gets worse.  You are not hungry or you lose weight without trying.  You are constipated or have diarrhea for more  than 2-3 days.  You have pain when you urinate or have a bowel movement.  Your abdominal pain wakes you up at night.  Your pain gets worse with meals, after eating, or with certain foods.  You are throwing up and cannot keep anything down.  You have a fever. Get help right away if:  Your pain does not go away as soon as your health care provider told you to expect.  You cannot stop throwing up.  Your pain is only in areas of the abdomen, such as the right side or the left lower portion of the abdomen.  You have bloody or black stools, or stools that look like tar.  You have severe pain, cramping, or  bloating in your abdomen.  You have signs of dehydration, such as: ? Dark urine, very little urine, or no urine. ? Cracked lips. ? Dry mouth. ? Sunken eyes. ? Sleepiness. ? Weakness. This information is not intended to replace advice given to you by your health care provider. Make sure you discuss any questions you have with your health care provider. Document Released: 01/11/2005 Document Revised: 10/22/2015 Document Reviewed: 09/15/2015 Elsevier Interactive Patient Education  [employer].

## 2018-01-25 LAB — CBC WITH DIFFERENTIAL/PLATELET
Basophils Absolute: 41 cells/uL (ref 0–200)
Basophils Relative: 0.6 %
Eosinophils Absolute: 122 cells/uL (ref 15–500)
Eosinophils Relative: 1.8 %
HCT: 37.7 % (ref 35.0–45.0)
Hemoglobin: 12.6 g/dL (ref 11.7–15.5)
Lymphs Abs: 1911 cells/uL (ref 850–3900)
MCH: 29.6 pg (ref 27.0–33.0)
MCHC: 33.4 g/dL (ref 32.0–36.0)
MCV: 88.7 fL (ref 80.0–100.0)
MPV: 11.1 fL (ref 7.5–12.5)
Monocytes Relative: 8.7 %
Neutro Abs: 4134 cells/uL (ref 1500–7800)
Neutrophils Relative %: 60.8 %
Platelets: 83 10*3/uL — ABNORMAL LOW (ref 140–400)
RBC: 4.25 10*6/uL (ref 3.80–5.10)
RDW: 13 % (ref 11.0–15.0)
Total Lymphocyte: 28.1 %
WBC mixed population: 592 cells/uL (ref 200–950)
WBC: 6.8 10*3/uL (ref 3.8–10.8)

## 2018-01-25 LAB — COMPLETE METABOLIC PANEL WITH GFR
AG Ratio: 1.8 (calc) (ref 1.0–2.5)
ALT: 18 U/L (ref 6–29)
AST: 23 U/L (ref 10–35)
Albumin: 4 g/dL (ref 3.6–5.1)
Alkaline phosphatase (APISO): 71 U/L (ref 33–130)
BUN/Creatinine Ratio: 16 (calc) (ref 6–22)
BUN: 20 mg/dL (ref 7–25)
CO2: 29 mmol/L (ref 20–32)
Calcium: 10 mg/dL (ref 8.6–10.4)
Chloride: 108 mmol/L (ref 98–110)
Creat: 1.26 mg/dL — ABNORMAL HIGH (ref 0.60–0.93)
GFR, Est African American: 49 mL/min/{1.73_m2} — ABNORMAL LOW (ref >=60)
GFR, Est Non African American: 42 mL/min/{1.73_m2} — ABNORMAL LOW (ref >=60)
Globulin: 2.2 g/dL (calc) (ref 1.9–3.7)
Glucose, Bld: 61 mg/dL — ABNORMAL LOW (ref 65–99)
Potassium: 4.8 mmol/L (ref 3.5–5.3)
Sodium: 142 mmol/L (ref 135–146)
Total Bilirubin: 0.8 mg/dL (ref 0.2–1.2)
Total Protein: 6.2 g/dL (ref 6.1–8.1)

## 2018-01-25 LAB — IRON, TOTAL/TOTAL IRON BINDING CAP
%SAT: 41 % (calc) (ref 16–45)
Iron: 139 ug/dL (ref 45–160)
TIBC: 335 mcg/dL (calc) (ref 250–450)

## 2018-01-25 LAB — HEMOGLOBIN A1C
Hgb A1c MFr Bld: 5.7 % of total Hgb — ABNORMAL HIGH (ref <5.7)
Mean Plasma Glucose: 117 (calc)
eAG (mmol/L): 6.5 (calc)

## 2018-01-25 LAB — LIPID PANEL
Cholesterol: 143 mg/dL (ref <200)
HDL: 62 mg/dL (ref >50)
LDL Cholesterol (Calc): 58 mg/dL (calc)
Non-HDL Cholesterol (Calc): 81 mg/dL (calc) (ref <130)
Total CHOL/HDL Ratio: 2.3 (calc) (ref <5.0)
Triglycerides: 150 mg/dL — ABNORMAL HIGH (ref <150)

## 2018-01-25 LAB — TSH: TSH: 1.87 mIU/L (ref 0.40–4.50)

## 2018-01-30 ENCOUNTER — Encounter: Payer: Self-pay | Admitting: Gastroenterology

## 2018-02-07 ENCOUNTER — Ambulatory Visit (INDEPENDENT_AMBULATORY_CARE_PROVIDER_SITE_OTHER): Payer: Medicare Other

## 2018-02-07 VITALS — Temp 97.5°F

## 2018-02-07 DIAGNOSIS — Z23 Encounter for immunization: Secondary | ICD-10-CM | POA: Diagnosis not present

## 2018-02-15 ENCOUNTER — Encounter: Payer: Self-pay | Admitting: Gastroenterology

## 2018-02-15 ENCOUNTER — Ambulatory Visit: Payer: Medicare Other | Admitting: Gastroenterology

## 2018-02-15 ENCOUNTER — Other Ambulatory Visit (INDEPENDENT_AMBULATORY_CARE_PROVIDER_SITE_OTHER): Payer: Medicare Other

## 2018-02-15 VITALS — BP 108/72 | HR 64 | Ht 64.0 in | Wt 162.0 lb

## 2018-02-15 DIAGNOSIS — R933 Abnormal findings on diagnostic imaging of other parts of digestive tract: Secondary | ICD-10-CM | POA: Diagnosis not present

## 2018-02-15 DIAGNOSIS — R1013 Epigastric pain: Secondary | ICD-10-CM | POA: Diagnosis not present

## 2018-02-15 DIAGNOSIS — R194 Change in bowel habit: Secondary | ICD-10-CM

## 2018-02-15 LAB — HEPATIC FUNCTION PANEL
ALT: 13 U/L (ref 0–35)
AST: 18 U/L (ref 0–37)
Albumin: 4.2 g/dL (ref 3.5–5.2)
Alkaline Phosphatase: 71 U/L (ref 39–117)
Bilirubin, Direct: 0.1 mg/dL (ref 0.0–0.3)
Total Bilirubin: 0.6 mg/dL (ref 0.2–1.2)
Total Protein: 6.6 g/dL (ref 6.0–8.3)

## 2018-02-15 MED ORDER — SUCRALFATE 1 GM/10ML PO SUSP: 1.0000 g | Freq: Four times a day (QID) | ORAL | 1 refills | Status: DC

## 2018-02-15 NOTE — Progress Notes (Signed)
Referring Provider: Unk Pinto, MD Primary Care Physician:  Unk Pinto, MD   Reason for Consultation:  Abdominal pain and refux   IMPRESSION:  Epigastric/midabdominal pain x 3 months Change in bowel habits Abnormal MRA of the abdomen 08/16/17 to evaluate kidney cysts    - CBD 1.10m with intrahepatic biliary dilatation. Fatty liver.    - Normal liver enzymes  Moderate hiatal hernia on CT abd/pelvis 12/25/16 Recent Cologuard negative History of PUD Screening colonoscopy in 2009, due for surveillance Cholecystectomy for gallstones 1990  Etiology of abdomina pain is unclear. Differential includes common causes such as reflux esophagitis, PUD, H pylori, functional abdominal pain, food intolerance, gastritis as well as biliary abnormalities. I have recommended an EGD and colonoscopy. Given her abnormal MRA, will plan MRI/MRCP for further evaluation, as well.  PLAN: Carafate 1 gram QID Continue Zantac and Protonix Hepatic function panel MRI/MRCP EGD and colonoscopy  I consented the patient at the bedside today discussing the risks, benefits, and alternatives to endoscopic evaluation. In particular, we discussed the risks that include, but are not limited to, reaction to medication, cardiopulmonary compromise, bleeding requiring blood transfusion, aspiration resulting in pneumonia, perforation requiring surgery, and even death. We reviewed the risk of missed lesion including polyps or even cancer. The patient acknowledges these risks and asks that we proceed.  HPI: Jillian Rodgers a 72y.o. female previously seen by Dr. 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 Interval history is obtained through the patient, referral records, and review of her electronic health record.  She has hypothyroidism, hypertension, hyperlipidemia, vitamin D deficiency, thrombocytopenia, chronic kidney disease, and chronic headaches.  Seen in consultation for  intermittent, but constant when it occurs, non-radiating midepigastic, stabbing abdominal pain x 3 months. Can last minutes to an hour. Some back pain and a left upper chest/shoulder pain that may be related. + eructation, but not malodorous. No dysphagia, odynophagia, nausea.  Associated anorexia but weight is stable. Relieved by bending over, providing support to the abdominal wall, or lying on her stomach. Not worsened by eating. No response to Tums.  She told me that she has not tried any other prescription, OTC, or herbal therapies, but when asked about her records from her PCP she concurred with protonix, zantac. Carafate was prescribed but she has not yet filled the prescription. No other associated symptoms. No identified exacerbating or relieving features.   During this same time she has also experienced a change in bowel habits. Small, firm bowel movements alternating with normal bowel movements and diarrhea. Multiple sizes and textures to her bowel movements will occur all in one day or over multiple days.   History of stomach ulcer in 1994. She can't remember her symptoms at that time to know if they were similar to those that she is currently experiencing.  Colonoscopy in 2009. She is due for surveillance and recently received a notice in the mail for scheduling. She recently had a Cologuard that was negative and was hoping to avoid screening colonoscopy.   Review of EPIC shows the following abdominal imaging studies: MRA 08/16/17 to evaluate kidney cysts: CBD 1.238mwith intrahepatic biliary dilatation. Fatty liver.  CT abd/pelvis with contrast 12/25/16: mild prominence of common bile duct, normal spleen, moderate hiatal hernia, diverticulosis, left inguinal hernia  Review of labs in EPIC show that liver enzymes have been normal over the last couple of years except no recent alk phos available.   Past Medical History:  Diagnosis Date  .  Degenerative joint disease   . Diabetes mellitus  without complication ([place])   . GERD (gastroesophageal reflux disease)   . Hyperlipidemia   . Hypertension   . Migraines    neurontin helps  . Pre-diabetes   . Thyroid disease     Past Surgical History:  Procedure Laterality Date  . CHOLECYSTECTOMY  1996   open  . COLONOSCOPY  2009   recommended 10 year f/u  . HERNIA REPAIR  1985   umb hernia rpr  . HERNIA REPAIR  2005 & 2009   ventral hernia rpr  . PILONIDAL CYST EXCISION  1972  . TONSILLECTOMY    . UPPER GI ENDOSCOPY    . VENTRAL HERNIA REPAIR      Prior to Admission medications   Medication Sig Start Date End Date Taking? Authorizing Provider  Ascorbic Acid (VITAMIN C) 500 MG CAPS Take 1 capsule by mouth daily.   Yes [provider]  aspirin 81 MG tablet Take 81 mg by mouth daily.   Yes [provider]  BOTOX 100 units SOLR injection  04/06/15  Yes [provider]  cetirizine (ZYRTEC) 10 MG tablet Take 10 mg by mouth daily.   Yes [provider]  Cholecalciferol (VITAMIN D PO) Take 5,000 Units by mouth daily.   Yes [provider]  erythromycin with ethanol (THERAMYCIN) 2 % external solution Apply topically as needed. 12/26/17  Yes Unk Pinto, MD  gabapentin (NEURONTIN) 600 MG tablet Take 600 mg by mouth 4 (four) times daily.    Yes [provider]  hyoscyamine (LEVSIN SL) 0.125 MG SL tablet DISSOLVE 1 TO 2 TABLETS UNDER THE TONGUE EVERY 4 HOURS AS NEEDED FOR NAUSEA OR CRAMPING OR DIARRHEA 10/15/17  Yes Liane Comber, NP  levothyroxine (SYNTHROID, LEVOTHROID) 50 MCG tablet TAKE 1 TABLET BY MOUTH EVERY MORNING ON AN EMPTY STOMACH 09/03/17  Yes Unk Pinto, MD  OVER THE COUNTER MEDICATION Taking Tangy Tangerine tabs 2.0 # 2 tabs daily.   Yes [provider]  pantoprazole (PROTONIX) 40 MG tablet TAKE 1 TABLET(40 MG) BY MOUTH DAILY 12/18/17  Yes Liane Comber, NP  pravastatin (PRAVACHOL) 40 MG tablet TAKE 1 TABLET BY MOUTH AT BEDTIME 11/18/17  Yes Unk Pinto, MD  ranitidine (ZANTAC) 300 MG tablet TAKE ONE TABLET BY MOUTH TWICE DAILY FOR ACID INDIGESTION AND REFLUX 04/23/17  Yes Unk Pinto, MD    Current Outpatient Medications  Medication Sig Dispense Refill  . Ascorbic Acid (VITAMIN C) 500 MG CAPS Take 1 capsule by mouth daily.    [name] Kitchen aspirin 81 MG tablet Take 81 mg by mouth daily.    [name] Kitchen BOTOX 100 units SOLR injection     . cetirizine (ZYRTEC) 10 MG tablet Take 10 mg by mouth daily.    . Cholecalciferol (VITAMIN D PO) Take 5,000 Units by mouth daily.    [name] Kitchen erythromycin with ethanol (THERAMYCIN) 2 % external solution Apply topically as needed. 60 mL 0  . gabapentin (NEURONTIN) 600 MG tablet Take 600 mg by mouth 4 (four) times daily.     . hyoscyamine (LEVSIN SL) 0.125 MG SL tablet DISSOLVE 1 TO 2 TABLETS UNDER THE TONGUE EVERY 4 HOURS AS NEEDED FOR NAUSEA OR CRAMPING OR DIARRHEA 1350 tablet 3  . levothyroxine (SYNTHROID, LEVOTHROID) 50 MCG tablet TAKE 1 TABLET BY MOUTH EVERY MORNING ON AN EMPTY STOMACH 90 tablet 1  . OVER THE COUNTER MEDICATION Taking Tangy Tangerine tabs 2.0 # 2 tabs daily.    . pantoprazole (PROTONIX) 40  MG tablet TAKE 1 TABLET(40 MG) BY MOUTH DAILY 90 tablet 0  . pravastatin (PRAVACHOL) 40 MG tablet TAKE 1 TABLET BY MOUTH AT BEDTIME 90 tablet 1  . ranitidine (ZANTAC) 300 MG tablet TAKE ONE TABLET BY MOUTH TWICE DAILY FOR ACID INDIGESTION AND REFLUX 180 tablet 3   No current facility-administered medications for this visit.     Allergies as of 02/15/2018  . (No Known Allergies)    Family History  Problem Relation Age of Onset  . Ovarian cancer Mother   . Heart disease Mother   . Cancer Mother   . Uterine cancer Mother   . Hypertension Mother   . Breast cancer Sister   . Stroke Paternal Uncle   . Congestive Heart Failure Father   . Gout Maternal Aunt     Social History   Socioeconomic History  . Marital status: Married    Spouse name: Not on file  . Number of children: Not on file  . Years of  education: Not on file  . Highest education level: Not on file  Occupational History  . Not on file  Social Needs  . Financial resource strain: Not on file  . Food insecurity:    Worry: Not on file    Inability: Not on file  . Transportation needs:    Medical: Not on file    Non-medical: Not on file  Tobacco Use  . Smoking status: Never Smoker  . Smokeless tobacco: Never Used  Substance and Sexual Activity  . Alcohol use: No  . Drug use: No  . Sexual activity: Not on file  Lifestyle  . Physical activity:    Days per week: Not on file    Minutes per session: Not on file  . Stress: Not on file  Relationships  . Social connections:    Talks on phone: Not on file    Gets together: Not on file    Attends religious service: Not on file    Active member of club or organization: Not on file    Attends meetings of clubs or organizations: Not on file    Relationship status: Not on file  . Intimate partner violence:    Fear of current or ex partner: Not on file    Emotionally abused: Not on file    Physically abused: Not on file    Forced sexual activity: Not on file  Other Topics Concern  . Not on file  Social History Narrative  . Not on file    Review of Systems: 12 system ROS is negative except as noted above .   Physical Exam: Vital signs were reviewed. General:   Alert, well-nourished, pleasant and cooperative in NAD Head:  Normocephalic and atraumatic. Eyes:  Sclera clear, no icterus.   Conjunctiva pink. Mouth:  No deformity or lesions.   Neck:  Supple; no thyromegaly. Lungs:  Clear throughout to auscultation.   No wheezes.  Heart:  Regular rate and rhythm; no murmurs Abdomen:  Soft, nontender, well-healed cholecystectomy scar,  normal bowel sounds. No rebound or guarding. No hepatosplenomegaly Rectal:  Deferred  Msk:  Symmetrical without gross deformities. Extremities:  No gross deformities or edema. Neurologic:  Alert and  oriented x4;  grossly nonfocal Skin:   No rash or bruise. Psych:  Alert and cooperative. Normal mood and affect.   Kimberly L. Tarri Glenn Md, MPH [facility] 02/15/2018, 1:40 PM

## 2018-02-15 NOTE — Patient Instructions (Signed)
You have been scheduled for an endoscopy. Please follow written instructions given to you at your visit today. If you use inhalers (even only as needed), please bring them with you on the day of your procedure. Your physician has requested that you go to www.startemmi.com and enter the access code given to you at your visit today. This web site gives a general overview about your procedure. However, you should still follow specific instructions given to you by our office regarding your preparation for the procedure.  We have sent the following medications to your pharmacy for you to pick up at your convenience: Carafate 1 gram four times a day   Continue Zantac and Protonix   Your provider has requested that you go to the basement level for lab work before leaving today. Press "B" on the elevator. The lab is located at the first door on the left as you exit the elevator.  You have been scheduled for an MRI at [facility] on 02-25-18. Your appointment time is 9:00 am. Please arrive 15 minutes prior to your appointment time for registration purposes. Please make certain not to have anything to eat or drink 6 hours prior to your test. In addition, if you have any metal in your body, have a pacemaker or defibrillator, please be sure to let your ordering physician know. This test typically takes 45 minutes to 1 hour to complete. Should you need to reschedule, please call [phone] to do so.

## 2018-02-19 ENCOUNTER — Other Ambulatory Visit: Payer: Self-pay | Admitting: Obstetrics and Gynecology

## 2018-02-19 DIAGNOSIS — E2839 Other primary ovarian failure: Secondary | ICD-10-CM

## 2018-02-19 DIAGNOSIS — M8580 Other specified disorders of bone density and structure, unspecified site: Secondary | ICD-10-CM

## 2018-02-21 ENCOUNTER — Other Ambulatory Visit: Payer: Self-pay | Admitting: Obstetrics and Gynecology

## 2018-02-21 DIAGNOSIS — R928 Other abnormal and inconclusive findings on diagnostic imaging of breast: Secondary | ICD-10-CM

## 2018-02-25 ENCOUNTER — Ambulatory Visit (HOSPITAL_COMMUNITY)
Admission: RE | Admit: 2018-02-25 | Discharge: 2018-02-25 | Disposition: A | Discharge status: Home / Self Care | Payer: Medicare Other | Source: Ambulatory Visit | Attending: Gastroenterology | Admitting: Gastroenterology

## 2018-02-25 ENCOUNTER — Other Ambulatory Visit: Payer: Self-pay | Admitting: Gastroenterology

## 2018-02-25 DIAGNOSIS — K449 Diaphragmatic hernia without obstruction or gangrene: Secondary | ICD-10-CM | POA: Diagnosis not present

## 2018-02-25 DIAGNOSIS — Z9049 Acquired absence of other specified parts of digestive tract: Secondary | ICD-10-CM | POA: Insufficient documentation

## 2018-02-25 DIAGNOSIS — R933 Abnormal findings on diagnostic imaging of other parts of digestive tract: Secondary | ICD-10-CM | POA: Insufficient documentation

## 2018-02-25 DIAGNOSIS — N281 Cyst of kidney, acquired: Secondary | ICD-10-CM | POA: Diagnosis not present

## 2018-02-25 MED ORDER — GADOBUTROL 1 MMOL/ML IV SOLN
7.5000 mL | Freq: Once | INTRAVENOUS | Status: AC | PRN
  Administered 2018-02-25: 7 mL via INTRAVENOUS

## 2018-02-26 ENCOUNTER — Ambulatory Visit
Admission: RE | Admit: 2018-02-26 | Discharge: 2018-02-26 | Disposition: A | Discharge status: Home / Self Care | Payer: Medicare Other | Source: Ambulatory Visit | Attending: Obstetrics and Gynecology | Admitting: Obstetrics and Gynecology

## 2018-02-26 ENCOUNTER — Other Ambulatory Visit: Payer: Self-pay | Admitting: Obstetrics and Gynecology

## 2018-02-26 ENCOUNTER — Ambulatory Visit: Payer: Medicare Other

## 2018-02-26 DIAGNOSIS — R928 Other abnormal and inconclusive findings on diagnostic imaging of breast: Secondary | ICD-10-CM

## 2018-02-26 DIAGNOSIS — N6011 Diffuse cystic mastopathy of right breast: Secondary | ICD-10-CM

## 2018-02-28 ENCOUNTER — Ambulatory Visit: Payer: Medicare Other | Admitting: Gastroenterology

## 2018-02-28 ENCOUNTER — Ambulatory Visit: Payer: Medicare Other | Admitting: Physician Assistant

## 2018-03-18 ENCOUNTER — Other Ambulatory Visit: Payer: Self-pay | Admitting: Adult Health

## 2018-03-18 ENCOUNTER — Other Ambulatory Visit: Payer: Self-pay | Admitting: Internal Medicine

## 2018-03-18 DIAGNOSIS — K219 Gastro-esophageal reflux disease without esophagitis: Secondary | ICD-10-CM

## 2018-03-19 ENCOUNTER — Encounter: Payer: Self-pay | Admitting: Gastroenterology

## 2018-03-19 ENCOUNTER — Ambulatory Visit (AMBULATORY_SURGERY_CENTER): Payer: Medicare Other | Admitting: Gastroenterology

## 2018-03-19 VITALS — BP 152/89 | HR 63 | Temp 97.8°F | Resp 14 | Ht 64.0 in | Wt 162.0 lb

## 2018-03-19 DIAGNOSIS — K2970 Gastritis, unspecified, without bleeding: Secondary | ICD-10-CM

## 2018-03-19 DIAGNOSIS — R109 Unspecified abdominal pain: Secondary | ICD-10-CM

## 2018-03-19 DIAGNOSIS — K449 Diaphragmatic hernia without obstruction or gangrene: Secondary | ICD-10-CM

## 2018-03-19 MED ORDER — SODIUM CHLORIDE 0.9 % IV SOLN: 500.0000 mL | Freq: Once | INTRAVENOUS | Status: DC

## 2018-03-19 NOTE — Progress Notes (Signed)
Report given to PACU, vss 

## 2018-03-19 NOTE — Progress Notes (Signed)
Called to room to assist during endoscopic procedure.  Patient ID and intended procedure confirmed with present staff. Received instructions for my participation in the procedure from the performing physician.  

## 2018-03-19 NOTE — Patient Instructions (Signed)
Please read handout on Gastritis. Continue present medications including Protonix, Zantac, and Carafate. Return to GI clinic in 4 weeks. Avoid all NSAIDS.     YOU HAD AN ENDOSCOPIC PROCEDURE TODAY AT [facility]:   Refer to the procedure report that was given to you for any specific questions about what was found during the examination.  If the procedure report does not answer your questions, please call your gastroenterologist to clarify.  If you requested that your care partner not be given the details of your procedure findings, then the procedure report has been included in a sealed envelope for you to review at your convenience later.  YOU SHOULD EXPECT: Some feelings of bloating in the abdomen. Passage of more gas than usual.  Walking can help get rid of the air that was put into your GI tract during the procedure and reduce the bloating. If you had a lower endoscopy (such as a colonoscopy or flexible sigmoidoscopy) you may notice spotting of blood in your stool or on the toilet paper. If you underwent a bowel prep for your procedure, you may not have a normal bowel movement for a few days.  Please Note:  You might notice some irritation and congestion in your nose or some drainage.  This is from the oxygen used during your procedure.  There is no need for concern and it should clear up in a day or so.  SYMPTOMS TO REPORT IMMEDIATELY:     Following upper endoscopy (EGD)  Vomiting of blood or coffee ground material  New chest pain or pain under the shoulder blades  Painful or persistently difficult swallowing  New shortness of breath  Fever of 100F or higher  Black, tarry-looking stools  For urgent or emergent issues, a gastroenterologist can be reached at any hour by calling [phone].   DIET:  We do recommend a small meal at first, but then you may proceed to your regular diet.  Drink plenty of fluids but you should avoid alcoholic beverages for 24  hours.  ACTIVITY:  You should plan to take it easy for the rest of today and you should NOT DRIVE or use heavy machinery until tomorrow (because of the sedation medicines used during the test).    FOLLOW UP: Our staff will call the number listed on your records the next business day following your procedure to check on you and address any questions or concerns that you may have regarding the information given to you following your procedure. If we do not reach you, we will leave a message.  However, if you are feeling well and you are not experiencing any problems, there is no need to return our call.  We will assume that you have returned to your regular daily activities without incident.  If any biopsies were taken you will be contacted by phone or by letter within the next 1-3 weeks.  Please call us at [phone] if you have not heard about the biopsies in 3 weeks.    SIGNATURES/CONFIDENTIALITY: You and/or your care partner have signed paperwork which will be entered into your electronic medical record.  These signatures attest to the fact that that the information above on your After Visit Summary has been reviewed and is understood.  Full responsibility of the confidentiality of this discharge information lies with you and/or your care-partner.

## 2018-03-20 ENCOUNTER — Telehealth: Payer: Self-pay | Admitting: *Deleted

## 2018-03-20 NOTE — Telephone Encounter (Signed)
  Follow up Call-  Call back number 03/19/2018  Post procedure Call Back phone  # [phone]  Permission to leave phone message Yes  Some recent data might be hidden     Patient questions:  Do you have a fever, pain , or abdominal swelling? No. Pain Score  0 *  Have you tolerated food without any problems? Yes.    Have you been able to return to your normal activities? Yes.    Do you have any questions about your discharge instructions: Diet   No. Medications  No. Follow up visit  No.  Do you have questions or concerns about your Care? No.  Actions: * If pain score is 4 or above: No action needed, pain <4.

## 2018-03-21 NOTE — Op Note (Signed)
[facility] Patient Name: Jillian Rodgers Procedure Date: 03/19/2018 2:13 PM MRN: [id number] Endoscopist: Thornton Park MD, MD Age: 72 Referring MD:  Date of Birth: 1945/12/19 Gender: Female Account #: [id number] Procedure:                Upper GI endoscopy Indications:              Epigastric abdominal pain, Generalized abdominal                            pain, recent change in bowel habits Medicines:                See the Anesthesia note for documentation of the                            administered medications Procedure:                Pre-Anesthesia Assessment:                           - Prior to the procedure, a History and Physical                            was performed, and patient medications and                            allergies were reviewed. The patient's tolerance of                            previous anesthesia was also reviewed. The risks                            and benefits of the procedure and the sedation                            options and risks were discussed with the patient.                            All questions were answered, and informed consent                            was obtained. Prior Anticoagulants: The patient has                            taken no previous anticoagulant or antiplatelet                            agents. ASA Grade Assessment: II - A patient with                            mild systemic disease. After reviewing the risks                            and benefits, the patient was deemed in  satisfactory condition to undergo the procedure.                           After obtaining informed consent, the endoscope was                            passed under direct vision. Throughout the                            procedure, the patient's blood pressure, pulse, and                            oxygen saturations were monitored continuously. The                            Model GIF-HQ190  [id number]) scope was introduced                            through the mouth, and advanced to the third part                            of duodenum. The upper GI endoscopy was                            accomplished without difficulty. The patient                            tolerated the procedure well. Scope In: Scope Out: Findings:                 The examined esophagus was normal. Biopsies were                            taken with a cold forceps for histology.                           Scattered mild inflammation characterized by                            friability and granularity was found in the gastric                            body. Biopsies were taken with a cold forceps for                            histology.                           The examined duodenum was normal. Biopsies were                            taken with a cold forceps for histology.                           A medium-sized hiatal hernia was present. Complications:  No immediate complications. Estimated Blood Loss:     Estimated blood loss: none. Impression:               - Normal esophagus. Biopsied.                           - Gastritis. Biopsied.                           - Normal examined duodenum. Biopsied. Recommendation:           - Await pathology results.                           - Resume regular diet today.                           - Avoid all NSAIDs.                           - Continue present medications including Protonix,                            Zantac, and Carafate.                           - Return to GI clinic in 4 weeks. Thornton Park MD, MD 03/19/2018 2:32:36 PM This report has been signed electronically.

## 2018-03-25 ENCOUNTER — Encounter: Payer: Self-pay | Admitting: Gastroenterology

## 2018-03-27 ENCOUNTER — Encounter: Payer: Self-pay | Admitting: Internal Medicine

## 2018-04-24 ENCOUNTER — Ambulatory Visit: Payer: Medicare Other | Admitting: Gastroenterology

## 2018-05-01 ENCOUNTER — Encounter: Payer: Self-pay | Admitting: Internal Medicine

## 2018-05-08 ENCOUNTER — Ambulatory Visit: Payer: Medicare Other | Admitting: Internal Medicine

## 2018-05-08 ENCOUNTER — Encounter: Payer: Self-pay | Admitting: Internal Medicine

## 2018-05-08 VITALS — BP 122/76 | HR 81 | Temp 97.0°F | Ht 63.0 in | Wt 152.8 lb

## 2018-05-08 DIAGNOSIS — Z8249 Family history of ischemic heart disease and other diseases of the circulatory system: Secondary | ICD-10-CM

## 2018-05-08 DIAGNOSIS — E782 Mixed hyperlipidemia: Secondary | ICD-10-CM

## 2018-05-08 DIAGNOSIS — Z1211 Encounter for screening for malignant neoplasm of colon: Secondary | ICD-10-CM

## 2018-05-08 DIAGNOSIS — R7309 Other abnormal glucose: Secondary | ICD-10-CM

## 2018-05-08 DIAGNOSIS — N183 Chronic kidney disease, stage 3 unspecified: Secondary | ICD-10-CM

## 2018-05-08 DIAGNOSIS — I10 Essential (primary) hypertension: Secondary | ICD-10-CM | POA: Diagnosis not present

## 2018-05-08 DIAGNOSIS — Z0001 Encounter for general adult medical examination with abnormal findings: Secondary | ICD-10-CM

## 2018-05-08 DIAGNOSIS — E039 Hypothyroidism, unspecified: Secondary | ICD-10-CM

## 2018-05-08 DIAGNOSIS — Z0000 Encounter for general adult medical examination without abnormal findings: Secondary | ICD-10-CM | POA: Diagnosis not present

## 2018-05-08 DIAGNOSIS — R7303 Prediabetes: Secondary | ICD-10-CM

## 2018-05-08 DIAGNOSIS — K219 Gastro-esophageal reflux disease without esophagitis: Secondary | ICD-10-CM

## 2018-05-08 DIAGNOSIS — Z136 Encounter for screening for cardiovascular disorders: Secondary | ICD-10-CM

## 2018-05-08 DIAGNOSIS — E559 Vitamin D deficiency, unspecified: Secondary | ICD-10-CM

## 2018-05-08 DIAGNOSIS — Z79899 Other long term (current) drug therapy: Secondary | ICD-10-CM

## 2018-05-08 DIAGNOSIS — Z1212 Encounter for screening for malignant neoplasm of rectum: Secondary | ICD-10-CM

## 2018-05-08 NOTE — Patient Instructions (Signed)

## 2018-05-08 NOTE — Progress Notes (Signed)
[facility] Unk Pinto, M.D.     Uvaldo Bristle. Silverio Lay, P.A.-C Liane Comber, [place] [address] [place], [place] [address] Telephone [phone] Telefax [phone] Annual Screening/Preventative Visit & Comprehensive Evaluation &  Examination     This very nice 73 y.o. MWF presents for a Screening /Preventative Visit & comprehensive evaluation and management of multiple medical co-morbidities.  Patient has been followed for HTN, HLD, Prediabetes  and Vitamin D Deficiency. Patient has GERD controlled on her meds. She had a recent negative EGD.      Patient has labile  HTN monitored expectantly circa 1990's. Patient's BP has been controlled at home and patient denies any cardiac symptoms as chest pain, palpitations, shortness of breath, dizziness or ankle swelling. Today's BP is at goal - 122/76.      Patient's hyperlipidemia is controlled with diet and medications. Patient denies myalgias or other medication SE's. Last lipids were at goal albeit elevated Trig's: Lab Results  Component Value Date   CHOL 143 01/24/2018   HDL 62 01/24/2018   LDLCALC 58 01/24/2018   TRIG 150 (H) 01/24/2018   CHOLHDL 2.3 01/24/2018      Patient has been on Thyroid Replacement circa 1990's.      Patient has hx/o prediabetes predating (A1c 6.0% / 2012)  and patient denies reactive hypoglycemic symptoms, visual blurring, diabetic polys or paresthesias. Last A1c was almost to goal: Lab Results  Component Value Date   HGBA1C 5.7 (H) 01/24/2018      Finally, patient has history of Vitamin D Deficiency ("39" / 2012)  and last Vitamin D was at goal: Lab Results  Component Value Date   VD25OH 86 10/15/2017   Current Outpatient Medications on File Prior to Visit  Medication Sig  . Ascorbic Acid (VITAMIN C) 500 MG CAPS Take 1 capsule by mouth daily.  [name] Kitchen aspirin 81 MG tablet Take 81 mg by mouth daily.  [name] Kitchen BOTOX 100 units SOLR  injection   . cetirizine (ZYRTEC) 10 MG tablet Take 10 mg by mouth daily.  . Cholecalciferol (VITAMIN D PO) Take 5,000 Units by mouth daily.  [name] Kitchen erythromycin with ethanol (THERAMYCIN) 2 % external solution Apply topically as needed.  . gabapentin (NEURONTIN) 600 MG tablet Take 600 mg by mouth 4 (four) times daily.   . hyoscyamine (LEVSIN SL) 0.125 MG SL tablet DISSOLVE 1 TO 2 TABLETS UNDER THE TONGUE EVERY 4 HOURS AS NEEDED FOR NAUSEA OR CRAMPING OR DIARRHEA  . levothyroxine (SYNTHROID, LEVOTHROID) 50 MCG tablet TAKE 1 TABLET BY MOUTH EVERY MORNING ON AN EMPTY STOMACH  . OVER THE COUNTER MEDICATION Taking Tangy Tangerine tabs 2.0 # 2 tabs daily.  . pantoprazole (PROTONIX) 40 MG tablet TAKE 1 TABLET(40 MG) BY MOUTH DAILY  . pravastatin (PRAVACHOL) 40 MG tablet TAKE 1 TABLET BY MOUTH AT BEDTIME  . ranitidine (ZANTAC) 300 MG tablet TAKE ONE TABLET BY MOUTH TWICE DAILY FOR ACID INDIGESTION AND REFLUX  . sucralfate (CARAFATE) 1 GM/10ML suspension Take 10 mLs (1 g total) by mouth 4 (four) times daily.   No current facility-administered medications on file prior to visit.    No Known Allergies   Past Medical History:  Diagnosis Date  . Degenerative joint disease   . Diabetes mellitus without complication ([place])   . GERD (gastroesophageal reflux disease)   . Hyperlipidemia   . Hypertension   . Migraines    neurontin helps  . Pre-diabetes   . Thyroid disease  Health Maintenance  Topic Date Due  . Hepatitis C Screening  07/10/2018 (Originally 04-22-1945)  . MAMMOGRAM  02/27/2020  . Fecal DNA (Cologuard)  01/09/2021  . TETANUS/TDAP  11/11/2024  . INFLUENZA VACCINE  Completed  . DEXA SCAN  Completed  . PNA vac Low Risk Adult  Completed   Immunization History  Administered Date(s) Administered  . Influenza, High Dose Seasonal PF 12/29/2013, 01/15/2015, 02/01/2016, 02/28/2017, 02/07/2018  . Influenza-Unspecified 01/24/2011, 02/02/2012  . Pneumococcal Conjugate-13 10/28/2014  .  Pneumococcal-Unspecified 11/28/2011  . Td 11/17/2010  . Tdap 11/12/2014  . Zoster 11/17/2010   Last Colon - 2009 - recc 10 yr f/u - due   01/09/2018 Cologard - Negative  Last MGM - 02/26/2018  Past Surgical History:  Procedure Laterality Date  . CHOLECYSTECTOMY  1996   open  . COLONOSCOPY  2009   recommended 10 year f/u  . HERNIA REPAIR  1985   umb hernia rpr  . HERNIA REPAIR  2005 & 2009   ventral hernia rpr  . PILONIDAL CYST EXCISION  1972  . TONSILLECTOMY    . UPPER GI ENDOSCOPY    . VENTRAL HERNIA REPAIR     Family History  Problem Relation Age of Onset  . Ovarian cancer Mother   . Heart disease Mother   . Cancer Mother   . Uterine cancer Mother   . Hypertension Mother   . Breast cancer Sister   . Stroke Paternal Uncle   . Congestive Heart Failure Father   . Gout Maternal Aunt   . Colon cancer Neg Hx   . Esophageal cancer Neg Hx   . Stomach cancer Neg Hx   . Rectal cancer Neg Hx    Social History   Tobacco Use  . Smoking status: Never Smoker  . Smokeless tobacco: Never Used  Substance Use Topics  . Alcohol use: No  . Drug use: No    ROS Constitutional: Denies fever, chills, weight loss/gain, headaches, insomnia,  night sweats, and change in appetite. Does c/o fatigue. Eyes: Denies redness, blurred vision, diplopia, discharge, itchy, watery eyes.  ENT: Denies discharge, congestion, post nasal drip, epistaxis, sore throat, earache, hearing loss, dental pain, Tinnitus, Vertigo, Sinus pain, snoring.  Cardio: Denies chest pain, palpitations, irregular heartbeat, syncope, dyspnea, diaphoresis, orthopnea, PND, claudication, edema Respiratory: denies cough, dyspnea, DOE, pleurisy, hoarseness, laryngitis, wheezing.  Gastrointestinal: Denies dysphagia, heartburn, reflux, water brash, pain, cramps, nausea, vomiting, bloating, diarrhea, constipation, hematemesis, melena, hematochezia, jaundice, hemorrhoids Genitourinary: Denies dysuria, frequency, urgency, nocturia,  hesitancy, discharge, hematuria, flank pain Breast: Breast lumps, nipple discharge, bleeding.  Musculoskeletal: Denies arthralgia, myalgia, stiffness, Jt. Swelling, pain, limp, and strain/sprain. Denies falls. Skin: Denies puritis, rash, hives, warts, acne, eczema, changing in skin lesion Neuro: No weakness, tremor, incoordination, spasms, paresthesia, pain Psychiatric: Denies confusion, memory loss, sensory loss. Denies Depression. Endocrine: Denies change in weight, skin, hair change, nocturia, and paresthesia, diabetic polys, visual blurring, hyper / hypo glycemic episodes.  Heme/Lymph: No excessive bleeding, bruising, enlarged lymph nodes.  Physical Exam  BP 122/76   Pulse 81   Temp (!) 97 F (36.1 C)   Ht 5\' 3"  (1.6 m)   Wt 152 lb 12.8 oz (69.3 kg)   SpO2 97%   BMI 27.07 kg/m   General Appearance: Well nourished, well groomed and in no apparent distress.  Eyes: PERRLA, EOMs, conjunctiva no swelling or erythema, normal fundi and vessels. Sinuses: No frontal/maxillary tenderness ENT/Mouth: EACs patent / TMs  nl. Nares clear without erythema, swelling, mucoid exudates.  Oral hygiene is good. No erythema, swelling, or exudate. Tongue normal, non-obstructing. Tonsils not swollen or erythematous. Hearing normal.  Neck: Supple, thyroid not palpable. No bruits, nodes or JVD. Respiratory: Respiratory effort normal.  BS equal and clear bilateral without rales, rhonci, wheezing or stridor. Cardio: Heart sounds are normal with regular rate and rhythm and no murmurs, rubs or gallops. Peripheral pulses are normal and equal bilaterally without edema. No aortic or femoral bruits. Chest: symmetric with normal excursions and percussion. Breasts: Symmetric, without lumps, nipple discharge, retractions, or fibrocystic changes.  Abdomen: Flat, soft with bowel sounds active. Nontender, no guarding, rebound, hernias, masses, or organomegaly.  Lymphatics: Non tender without lymphadenopathy.   Genitourinary:  Musculoskeletal: Full ROM all peripheral extremities, joint stability, 5/5 strength, and normal gait. Skin: Warm and dry without rashes, lesions, cyanosis, clubbing or  ecchymosis.  Neuro: Cranial nerves intact, reflexes equal bilaterally. Normal muscle tone, no cerebellar symptoms. Sensation intact.  Pysch: Alert and oriented X 3, normal affect, Insight and Judgment appropriate.   Assessment and Plan  1. Annual Preventative Screening Examination  2. Essential hypertension  - EKG 12-Lead - Urinalysis, Routine w reflex microscopic - Microalbumin / creatinine urine ratio - CBC with Differential/Platelet - COMPLETE METABOLIC PANEL WITH GFR - Magnesium - TSH  3. Hyperlipidemia, mixed  - EKG 12-Lead - Lipid panel - TSH  4. Abnormal glucose  - EKG 12-Lead - Hemoglobin A1c  5. Vitamin D deficiency  - VITAMIN D 25 Hydroxyl  6. Prediabetes  - EKG 12-Lead - Hemoglobin A1c - Insulin, random  7. Hypothyroidism  - TSH  8. CKD (chronic kidney disease) stage 3, GFR 30-59 ml/min (HCC)  - COMPLETE METABOLIC PANEL WITH GFR  9. Gastroesophageal reflux disease  - CBC with Differential/Platelet  10. Screening for colorectal cancer   11. Screening for ischemic heart disease  - EKG 12-Lead  12. FHx: heart disease  - EKG 12-Lead  13. Medication management  - Urinalysis, Routine w reflex microscopic - Microalbumin / creatinine urine ratio - CBC with Differential/Platelet - COMPLETE METABOLIC PANEL WITH GFR - Magnesium - Lipid panel - TSH - Hemoglobin A1c - Insulin, random - VITAMIN D 25 Hydroxyl       Patient was counseled in prudent diet to achieve/maintain BMI less than 25 for weight control, BP monitoring, regular exercise and medications. Discussed med's effects and SE's. Screening labs and tests as requested with regular follow-up as recommended. Over 40 minutes of exam, counseling, chart review and high complex critical decision making was  performed.

## 2018-05-09 LAB — CBC WITH DIFFERENTIAL/PLATELET
Absolute Monocytes: 571 cells/uL (ref 200–950)
Basophils Absolute: 61 cells/uL (ref 0–200)
Basophils Relative: 0.9 %
Eosinophils Absolute: 197 cells/uL (ref 15–500)
Eosinophils Relative: 2.9 %
HCT: 40.4 % (ref 35.0–45.0)
Hemoglobin: 13.8 g/dL (ref 11.7–15.5)
Lymphs Abs: 1272 cells/uL (ref 850–3900)
MCH: 30.7 pg (ref 27.0–33.0)
MCHC: 34.2 g/dL (ref 32.0–36.0)
MCV: 89.8 fL (ref 80.0–100.0)
MPV: 11.5 fL (ref 7.5–12.5)
Monocytes Relative: 8.4 %
Neutro Abs: 4699 cells/uL (ref 1500–7800)
Neutrophils Relative %: 69.1 %
Platelets: 77 10*3/uL — ABNORMAL LOW (ref 140–400)
RBC: 4.5 10*6/uL (ref 3.80–5.10)
RDW: 11.9 % (ref 11.0–15.0)
Total Lymphocyte: 18.7 %
WBC: 6.8 10*3/uL (ref 3.8–10.8)

## 2018-05-09 LAB — URINALYSIS, ROUTINE W REFLEX MICROSCOPIC
Bilirubin Urine: NEGATIVE
Glucose, UA: NEGATIVE
Hgb urine dipstick: NEGATIVE
Hyaline Cast: NONE SEEN /LPF
Ketones, ur: NEGATIVE
Leukocytes, UA: NEGATIVE
Nitrite: NEGATIVE
Specific Gravity, Urine: 1.026 (ref 1.001–1.03)
Squamous Epithelial / LPF: NONE SEEN /HPF (ref <=5)
pH: <=5 (ref 5.0–8.0)

## 2018-05-09 LAB — MAGNESIUM: Magnesium: 2 mg/dL (ref 1.5–2.5)

## 2018-05-09 LAB — COMPLETE METABOLIC PANEL WITH GFR
AG Ratio: 1.6 (calc) (ref 1.0–2.5)
ALT: 15 U/L (ref 6–29)
AST: 19 U/L (ref 10–35)
Albumin: 4.2 g/dL (ref 3.6–5.1)
Alkaline phosphatase (APISO): 85 U/L (ref 33–130)
BUN/Creatinine Ratio: 18 (calc) (ref 6–22)
BUN: 24 mg/dL (ref 7–25)
CO2: 26 mmol/L (ref 20–32)
Calcium: 10.4 mg/dL (ref 8.6–10.4)
Chloride: 104 mmol/L (ref 98–110)
Creat: 1.3 mg/dL — ABNORMAL HIGH (ref 0.60–0.93)
GFR, Est African American: 47 mL/min/{1.73_m2} — ABNORMAL LOW (ref >=60)
GFR, Est Non African American: 40 mL/min/{1.73_m2} — ABNORMAL LOW (ref >=60)
Globulin: 2.6 g/dL (calc) (ref 1.9–3.7)
Glucose, Bld: 73 mg/dL (ref 65–99)
Potassium: 4.2 mmol/L (ref 3.5–5.3)
Sodium: 140 mmol/L (ref 135–146)
Total Bilirubin: 0.7 mg/dL (ref 0.2–1.2)
Total Protein: 6.8 g/dL (ref 6.1–8.1)

## 2018-05-09 LAB — LIPID PANEL
Cholesterol: 156 mg/dL (ref <200)
HDL: 67 mg/dL (ref >50)
LDL Cholesterol (Calc): 69 mg/dL (calc)
Non-HDL Cholesterol (Calc): 89 mg/dL (calc) (ref <130)
Total CHOL/HDL Ratio: 2.3 (calc) (ref <5.0)
Triglycerides: 118 mg/dL (ref <150)

## 2018-05-09 LAB — TSH: TSH: 0.74 mIU/L (ref 0.40–4.50)

## 2018-05-09 LAB — VITAMIN D 25 HYDROXY (VIT D DEFICIENCY, FRACTURES): Vit D, 25-Hydroxy: 89 ng/mL (ref 30–100)

## 2018-05-09 LAB — HEMOGLOBIN A1C
Hgb A1c MFr Bld: 5.4 % of total Hgb (ref <5.7)
Mean Plasma Glucose: 108 (calc)
eAG (mmol/L): 6 (calc)

## 2018-05-09 LAB — MICROALBUMIN / CREATININE URINE RATIO
Creatinine, Urine: 195 mg/dL (ref 20–275)
Microalb Creat Ratio: 14 mcg/mg creat (ref <30)
Microalb, Ur: 2.7 mg/dL

## 2018-05-09 LAB — INSULIN, RANDOM: Insulin: 9.8 u[IU]/mL (ref 2.0–19.6)

## 2018-05-14 ENCOUNTER — Ambulatory Visit: Payer: Medicare Other | Admitting: Gastroenterology

## 2018-05-14 ENCOUNTER — Encounter: Payer: Self-pay | Admitting: Gastroenterology

## 2018-05-14 VITALS — BP 86/66 | HR 81 | Ht 63.0 in | Wt 156.0 lb

## 2018-05-14 DIAGNOSIS — R1013 Epigastric pain: Secondary | ICD-10-CM | POA: Diagnosis not present

## 2018-05-14 DIAGNOSIS — K449 Diaphragmatic hernia without obstruction or gangrene: Secondary | ICD-10-CM | POA: Diagnosis not present

## 2018-05-14 DIAGNOSIS — R194 Change in bowel habit: Secondary | ICD-10-CM

## 2018-05-14 NOTE — Patient Instructions (Signed)
Continue Zantac and Protonix.   Consider colonoscopy.

## 2018-05-14 NOTE — Progress Notes (Signed)
Referring Provider: Unk Pinto, MD Primary Care Physician:  Unk Pinto, MD   Chief complaint:  Abdominal pain and refux   IMPRESSION:  Epigastric/midabdominal pain x 3 months, improved    - EGD with inactive, H pylori negative gastritis 03/19/18 Change in bowel habits, improved Abnormal MRA of the abdomen 08/16/17 to evaluate kidney cysts    - CBD 1.52mm with intrahepatic biliary dilatation. Fatty liver.    - Normal liver enzymes November 2019    - MRCP 02/25/18 showed normal CBD and no intrahepatic biliary dilatation Moderate hiatal hernia on CT abd/pelvis 12/25/16 and EGD Cologuard negative 2019 History of PUD in the distant past Normal screening colonoscopy in 2009 Cholecystectomy for gallstones 1990   PLAN: Continue on Zantac and Protonix Colonoscopy at her convenience if symptoms return Return with any new or worrisome symptoms Consider trial of Xifaxan for recurrent symptoms   HPI: Jillian Rodgers is a 73 y.o. female previously seen by Dr. Fuller Plan.  She returns in scheduled follow-up after her initial office visit with me.  Retired [employer] 30 years ago. Interval history is obtained through the patient and review of her electronic health record.  She has hypothyroidism, hypertension, hyperlipidemia, vitamin D deficiency, thrombocytopenia, chronic kidney disease, and chronic headaches. Following her consultation she had:  An MRCP 02/25/2018 showed a moderate hiatal hernia, prior cholecystectomy, common bile duct measuring 9 mm, normal pancreas and pancreatic duct, renal cysts, and was otherwise normal.  Labs 05/08/2018 showed a normal comprehensive metabolic panel occluding AST 19, ALT 15, total bilirubin 0.7.  Her creatinine was slightly elevated at 1.3.  Upper endoscopy was performed 03/19/2018 to evaluate 3 months of a stabbing abdominal pain.  It showed gastritis and a medium sized hiatal hernia she was asked to continue  Protonix, Zantac, and Carafate.  Gastric biopsies showed chronic inactive gastritis.  Esophageal and small bowel biopsies were normal.  There was no evidence for H. Pylori.  The patient elected not to proceed with colonoscopy.  She is overall feeling better. GI symptoms have largely resolved. She is focusing on healthy eating. Bowel habits are "normal" formed but not hard, with a daily to every-other-day BM. Eructation has resolved. Sleeping upright in the recliner, as recommended by her dermatologist after a recent procedure, has helped.  She does not feel that additional evaluation or intervention is necessary at this time.     Prior symptoms: Seen in consultation for intermittent, but constant when it occurs, non-radiating midepigastic, stabbing abdominal pain x 3 months. Can last minutes to an hour. Some back pain and a left upper chest/shoulder pain that may be related. + eructation, but not malodorous. No dysphagia, odynophagia, nausea.  Associated anorexia but weight is stable. Relieved by bending over, providing support to the abdominal wall, or lying on her stomach. Not worsened by eating. No response to Tums.  She told me that she has not tried any other prescription, OTC, or herbal therapies, but when asked about her records from her PCP she concurred with protonix, zantac. Carafate was prescribed but she has not yet filled the prescription. No other associated symptoms. No identified exacerbating or relieving features. During this same time she has also experienced a change in bowel habits. Small, firm bowel movements alternating with normal bowel movements and diarrhea. Multiple sizes and textures to her bowel movements will occur all in one day or over multiple days.    Past Medical History:  Diagnosis Date  . Degenerative joint disease   .  Diabetes mellitus without complication ([place])   . GERD (gastroesophageal reflux disease)   . Hyperlipidemia   . Hypertension   . Migraines     neurontin helps  . Pre-diabetes   . Thyroid disease     Past Surgical History:  Procedure Laterality Date  . CHOLECYSTECTOMY  1996   open  . COLONOSCOPY  2009   recommended 10 year f/u  . HERNIA REPAIR  1985   umb hernia rpr  . HERNIA REPAIR  2005 & 2009   ventral hernia rpr  . PILONIDAL CYST EXCISION  1972  . TONSILLECTOMY    . UPPER GI ENDOSCOPY    . VENTRAL HERNIA REPAIR      Prior to Admission medications   Medication Sig Start Date End Date Taking? Authorizing Provider  Ascorbic Acid (VITAMIN C) 500 MG CAPS Take 1 capsule by mouth daily.   Yes [provider]  aspirin 81 MG tablet Take 81 mg by mouth daily.   Yes [provider]  BOTOX 100 units SOLR injection  04/06/15  Yes [provider]  cetirizine (ZYRTEC) 10 MG tablet Take 10 mg by mouth daily.   Yes [provider]  Cholecalciferol (VITAMIN D PO) Take 5,000 Units by mouth daily.   Yes [provider]  erythromycin with ethanol (THERAMYCIN) 2 % external solution Apply topically as needed. 12/26/17  Yes Unk Pinto, MD  gabapentin (NEURONTIN) 600 MG tablet Take 600 mg by mouth 4 (four) times daily.    Yes [provider]  hyoscyamine (LEVSIN SL) 0.125 MG SL tablet DISSOLVE 1 TO 2 TABLETS UNDER THE TONGUE EVERY 4 HOURS AS NEEDED FOR NAUSEA OR CRAMPING OR DIARRHEA 10/15/17  Yes Liane Comber, NP  levothyroxine (SYNTHROID, LEVOTHROID) 50 MCG tablet TAKE 1 TABLET BY MOUTH EVERY MORNING ON AN EMPTY STOMACH 09/03/17  Yes Unk Pinto, MD  OVER THE COUNTER MEDICATION Taking Tangy Tangerine tabs 2.0 # 2 tabs daily.   Yes [provider]  pantoprazole (PROTONIX) 40 MG tablet TAKE 1 TABLET(40 MG) BY MOUTH DAILY 12/18/17  Yes Liane Comber, NP  pravastatin (PRAVACHOL) 40 MG tablet TAKE 1 TABLET BY MOUTH AT BEDTIME 11/18/17  Yes Unk Pinto, MD  ranitidine (ZANTAC) 300 MG tablet TAKE ONE TABLET BY MOUTH TWICE DAILY FOR ACID INDIGESTION AND REFLUX 04/23/17  Yes  Unk Pinto, MD    Current Outpatient Medications  Medication Sig Dispense Refill  . Ascorbic Acid (VITAMIN C) 500 MG CAPS Take 1 capsule by mouth daily.    [name] Kitchen aspirin 81 MG tablet Take 81 mg by mouth daily.    [name] Kitchen BOTOX 100 units SOLR injection Every 6 to 8 weeks for headaches    . Calcium Carbonate-Vit D-Min (CALCIUM 1200 PO) Take 1,200 Units by mouth daily.    . cetirizine (ZYRTEC) 10 MG tablet Take 10 mg by mouth daily.    . Cholecalciferol (VITAMIN D PO) Take 5,000 Units by mouth daily.    [name] Kitchen erythromycin with ethanol (THERAMYCIN) 2 % external solution Apply topically as needed. 60 mL 0  . gabapentin (NEURONTIN) 600 MG tablet Take 600 mg by mouth 4 (four) times daily.     . hyoscyamine (LEVSIN SL) 0.125 MG SL tablet DISSOLVE 1 TO 2 TABLETS UNDER THE TONGUE EVERY 4 HOURS AS NEEDED FOR NAUSEA OR CRAMPING OR DIARRHEA 1350 tablet 3  . levothyroxine (SYNTHROID, LEVOTHROID) 50 MCG tablet TAKE 1 TABLET BY MOUTH EVERY MORNING ON AN EMPTY STOMACH 90 tablet 0  . OVER THE COUNTER MEDICATION  Take by mouth daily. Multi Vitamin: Taking Tangy Tangerine tabs 2.0 # 2 tabs daily.    . pantoprazole (PROTONIX) 40 MG tablet TAKE 1 TABLET(40 MG) BY MOUTH DAILY 90 tablet 0  . pravastatin (PRAVACHOL) 40 MG tablet TAKE 1 TABLET BY MOUTH AT BEDTIME 90 tablet 1  . ranitidine (ZANTAC) 300 MG tablet TAKE ONE TABLET BY MOUTH TWICE DAILY FOR ACID INDIGESTION AND REFLUX (Patient taking differently: daily. ) 180 tablet 3  . sucralfate (CARAFATE) 1 GM/10ML suspension Take 10 mLs (1 g total) by mouth 4 (four) times daily. 420 mL 1   No current facility-administered medications for this visit.     Allergies as of 05/14/2018  . (No Known Allergies)    Family History  Problem Relation Age of Onset  . Ovarian cancer Mother   . Heart disease Mother   . Cancer Mother   . Uterine cancer Mother   . Hypertension Mother   . Breast cancer Sister   . Stroke Paternal Uncle   . Congestive Heart Failure Father   . Gout  Maternal Aunt   . Colon cancer Neg Hx   . Esophageal cancer Neg Hx   . Stomach cancer Neg Hx   . Rectal cancer Neg Hx     Physical Exam: Vital signs were reviewed. General:   Alert, well-nourished, pleasant and cooperative in NAD Head:  Normocephalic and atraumatic. Eyes:  Sclera clear, no icterus.   Conjunctiva pink. Mouth:  No deformity or lesions.   Neck:  Supple; no thyromegaly. Lungs:  Clear throughout to auscultation.   No wheezes. Heart:  Regular rate and rhythm; no murmurs Abdomen:  Soft, nontender, well-healed cholecystectomy scar,  normal bowel sounds. No rebound or guarding. No hepatosplenomegaly Neurologic:  Alert and  oriented x4;  grossly nonfocal Skin:  No rash or bruise. Psych:  Alert and cooperative. Normal mood and affect.   Lizmary Nader L. Tarri Glenn Md, MPH [facility] 05/14/2018, 12:44 PM

## 2018-05-16 ENCOUNTER — Other Ambulatory Visit: Payer: Self-pay | Admitting: Internal Medicine

## 2018-06-10 ENCOUNTER — Other Ambulatory Visit: Payer: Self-pay | Admitting: Internal Medicine

## 2018-07-02 ENCOUNTER — Other Ambulatory Visit: Payer: Self-pay | Admitting: Internal Medicine

## 2018-07-23 ENCOUNTER — Ambulatory Visit: Payer: Self-pay | Admitting: Adult Health

## 2018-08-06 NOTE — Progress Notes (Signed)
Virtual Visit via Telephone Note  I connected with Jillian Rodgers on 08/07/18 at  2:00 PM EDT by telephone and verified that I am speaking with the correct person using two identifiers.   I discussed the limitations, risks, security and privacy concerns of performing an evaluation and management service by telephone and the availability of in person appointments. I also discussed with the patient that there may be a patient responsible charge related to this service. The patient expressed understanding and agreed to proceed.    I discussed the assessment and treatment plan with the patient. The patient was provided an opportunity to ask questions and all were answered. The patient agreed with the plan and demonstrated an understanding of the instructions.   The patient was advised to call back or seek an in-person evaluation if the symptoms worsen or if the condition fails to improve as anticipated.  I provided 30 minutes of non-face-to-face time during this encounter.   Izora Ribas, NP      MEDICARE ANNUAL WELLNESS VISIT AND FOLLOW UP  Assessment:   Diagnoses and all orders for this visit:  Encounter for Medicare annual wellness exam  Essential hypertension At goal; continue medication Monitor blood pressure at home; call if consistently over 130/80 Continue DASH diet.   Reminder to go to the ER if any CP, SOB, nausea, dizziness, severe HA, changes vision/speech, left arm numbness and tingling and jaw pain. -     Magnesium  Gastroesophageal reflux disease, esophagitis presence not specified Well managed on current medications; has failed PPI taper in the past; gastritis per last EGD, continue PPI Discussed diet, avoiding triggers and other lifestyle changes -     Magnesium  Hypothyroidism, unspecified type continue medications the same pending lab results  reminded to take on an empty stomach 30-27mins before food.  check TSH level -      TSH  Osteoarthritis, unspecified osteoarthritis type, unspecified site Intermittent; managed by OTC analgesics; established with orthopedics  CKD Stage 3 (GFR 36 ml/min) Increase fluids, avoid NSAIDS, monitor sugars, will monitor -     CMP/GFR  Vitamin D deficiency At goal at recent check; continue to recommend supplementation for goal of 70-100 Defer vitamin D level  Upper airway cough syndrome Continue with acid reflux medications  Thrombocytopenia (HCC) -     CBC with Differential/Platelet  Prediabetes Discussed disease and risks Discussed diet/exercise, weight management  -     Hemoglobin A1c  Overweight (BMI 25.0-29.9) Long discussion about weight loss, diet, and exercise Recommended diet heavy in fruits and veggies and low in animal meats, cheeses, and dairy products, appropriate calorie intake Discussed appropriate weight for height  Follow up at next visit  Medication management -     CBC with Differential/Platelet -     CMP/GFR   Mixed hyperlipidemia Continue medications Continue low cholesterol diet and exercise.  Check lipid panel.  -     Lipid panel  Near syncopal episodes New x several weeks, episodic; impression is could be dehydrated or vasovagal though concerned about possible new onset cardiac etiology Given instructions for orthostatic BPs at home; she will check today and again with next episode and call back  Normal EKG in 04/2018 No other red flags, not on BP medications, no new medications She will push water intake further, aim for 65+ fluid ounces daily If persistent will refer to cardiology for eval, holter   No labs at this time due to patient preference for telehealth during covid  19; follow up with lab visit if needed. She has appointment with nephrology next week and may get labs at that time.  Over 40 minutes of exam, counseling, chart review and critical decision making was performed Future Appointments  Date Time Provider [place]  08/29/2018  9:50 AM [facility] [place] [facility]  11/18/2018  2:30 PM Unk Pinto, MD [facility] None  05/30/2019 10:00 AM Unk Pinto, MD [facility] None     Plan:   During the course of the visit the patient was educated and counseled about appropriate screening and preventive services including:    Pneumococcal vaccine   Prevnar 13  Influenza vaccine  Td vaccine  Screening electrocardiogram  Bone densitometry screening  Colorectal cancer screening  Diabetes screening  Glaucoma screening  Nutrition counseling   Advanced directives: requested   Subjective:  Jillian Rodgers is a 73 y.o. female who presents for Medicare Annual Wellness Visit and 3 month follow up.   Patient has GERD & failed trial of transition of Protonix to Ranitidine alone and currently is controlled on Protonix/am. She underwent EGD 03/2018 which showed gastritis without evidence of dysplasia, malignancy or h. Pylori.   She reports she has had multiple near syncopal episodes for the past several weeks, approximately 2 episodes per week, dizzy and feeling as though she would fall, flushed, always when standing, vision starts to get dark and "feels funny". Denies headache, vision changes, Denies chest pain, dyspnea, palpitations, sensation of skipped beats, n/v, cold/clammy or diaphoretic. Resolves if she sits down for a few minutes. She has been pushing water intake. Not on any BM meds but does have BP cuff. Last EKG was in 04/2018 and was unremarkable. She reports hx of similar and discussed with Dr. Melford Aase and was felt dehydration contributed at that time. Not currently established with cardiology though reports remote history of unremarkable exercise stress test, appears was established with a Dr. Virl Axe.  She is followed by neurology for headaches and getting botox shots.    BMI is Body mass index is 27.28 kg/m., she has not been working on diet and exercise. Wt  Readings from Last 3 Encounters:  08/07/18 154 lb (69.9 kg)  05/14/18 156 lb (70.8 kg)  05/08/18 152 lb 12.8 oz (69.3 kg)    Her blood pressure has been controlled at home, today their BP is BP: 108/72 She does not workout. She denies chest pain, shortness of breath, dizziness.   She is on cholesterol medication (pravastatin 40 mg daily) and denies myalgias. Her cholesterol is at goal. The cholesterol last visit was:   Lab Results  Component Value Date   CHOL 156 05/08/2018   HDL 67 05/08/2018   LDLCALC 69 05/08/2018   TRIG 118 05/08/2018   CHOLHDL 2.3 05/08/2018    She has not been working on diet and exercise for glucose management, and denies foot ulcerations, increased appetite, nausea, paresthesia of the feet, polydipsia, polyuria, visual disturbances, vomiting and weight loss. Last A1C in the office was:  Lab Results  Component Value Date   HGBA1C 5.4 05/08/2018   She is on thyroid medication. Her medication was not changed last visit.   Lab Results  Component Value Date   TSH 0.74 05/08/2018   Last GFR: Lab Results  Component Value Date   GFRNONAA 40 (L) 05/08/2018  She is followed by Dr. Corliss Parish next week.   Patient is on Vitamin D supplement and at goal at recent check:  Lab Results  Component Value Date   VD25OH 89 05/08/2018         Medication Review: Current Outpatient Medications on File Prior to Visit  Medication Sig Dispense Refill  . Ascorbic Acid (VITAMIN C) 500 MG CAPS Take 1 capsule by mouth daily.    [name] Kitchen aspirin 81 MG tablet Take 81 mg by mouth daily.    [name] Kitchen BOTOX 100 units SOLR injection Every 6 to 8 weeks for headaches    . cetirizine (ZYRTEC) 10 MG tablet Take 10 mg by mouth daily.    . Cholecalciferol (VITAMIN D PO) Take 5,000 Units by mouth daily.    [name] Kitchen erythromycin with ethanol (THERAMYCIN) 2 % external solution Apply topically as needed. 60 mL 0  . gabapentin (NEURONTIN) 600 MG tablet Take 600 mg by mouth 4 (four) times daily.      . hyoscyamine (LEVSIN SL) 0.125 MG SL tablet DISSOLVE 1 TO 2 TABLETS UNDER THE TONGUE EVERY 4 HOURS AS NEEDED FOR NAUSEA OR CRAMPING OR DIARRHEA 1350 tablet 3  . levothyroxine (SYNTHROID, LEVOTHROID) 50 MCG tablet TAKE 1 TABLET BY MOUTH EVERY MORNING ON AN EMPTY STOMACH 90 tablet 1  . OVER THE COUNTER MEDICATION Take by mouth daily. Multi Vitamin: Taking Tangy Tangerine tabs 2.0 # 2 tabs daily.    . pantoprazole (PROTONIX) 40 MG tablet TAKE 1 TABLET(40 MG) BY MOUTH DAILY 90 tablet 0  . pravastatin (PRAVACHOL) 40 MG tablet TAKE 1 TABLET BY MOUTH AT BEDTIME 90 tablet 1  . Calcium Carbonate-Vit D-Min (CALCIUM 1200 PO) Take 1,200 Units by mouth daily.    . ranitidine (ZANTAC) 300 MG tablet TAKE ONE TABLET BY MOUTH TWICE DAILY FOR ACID INDIGESTION AND REFLUX 180 tablet 3  . sucralfate (CARAFATE) 1 GM/10ML suspension Take 10 mLs (1 g total) by mouth 4 (four) times daily. 420 mL 1   No current facility-administered medications on file prior to visit.     No Known Allergies  Current Problems (verified) Patient Active Problem List   Diagnosis Date Noted  . Thrombocytopenia ([place]) 11/25/2015  . Encounter for Medicare annual wellness exam 06/14/2015  . Overweight (BMI 25.0-29.9) 01/28/2015  . CKD (chronic kidney disease) stage 3, GFR 30-59 ml/min (HCC) 01/19/2014  . Upper airway cough syndrome 12/16/2013  . Vitamin D deficiency 05/21/2013  . Other abnormal glucose (prediabetes) 05/21/2013  . Medication management 05/21/2013  . Essential hypertension 01/31/2013  . Degenerative joint disease   . GERD (gastroesophageal reflux disease)   . Hyperlipidemia   . Hypothyroidism     Screening Tests Immunization History  Administered Date(s) Administered  . Influenza, High Dose Seasonal PF 12/29/2013, 01/15/2015, 02/01/2016, 02/28/2017, 02/07/2018  . Influenza-Unspecified 01/24/2011, 02/02/2012  . Pneumococcal Conjugate-13 10/28/2014  . Pneumococcal-Unspecified 11/28/2011  . Td 11/17/2010  . Tdap  11/12/2014  . Zoster 11/17/2010   Preventative care: Last colonoscopy: 2009 Last cologuard: 12/2017 Last EGD: 03/2018  Last mammogram: 02/2018 - does at GYN - Dr. Rogue Bussing, has follow up mammogram scheduled next month  DEXA:03/2016 L fem T -1.6, had in 2019 at GYN, pending receipt of report  Prior vaccinations: TD or Tdap: 2016  Influenza: 2019 Pneumococcal: 2013 Prevnar13: 2016 Shingles/Zostavax: 2016  Names of Other Physician/Practitioners you currently use: 1. [facility]- here for primary care 2. Dr. Gershon Crane, eye doctor, last visit 2019 3. Dr. Mathis Fare dentist, last visit 2020, goes q28m  Patient Care Team: Unk Pinto, [name], MD as Consulting Physician (  Nephrology)  SURGICAL HISTORY She  has a past surgical history that includes Ventral hernia repair; Cholecystectomy (1996); Pilonidal cyst excision (1972); Tonsillectomy; Upper gi endoscopy; Colonoscopy (2009); Hernia repair (1985); and Hernia repair (2005 & 2009). FAMILY HISTORY Her family history includes Breast cancer in her sister; Cancer in her mother; Congestive Heart Failure in her father; Gout in her maternal aunt; Heart disease in her mother; Hypertension in her mother; Ovarian cancer in her mother; Stroke in her paternal uncle; Uterine cancer in her mother. SOCIAL HISTORY She  reports that she has never smoked. She has never used smokeless tobacco. She reports that she does not drink alcohol or use drugs.   MEDICARE WELLNESS OBJECTIVES: Physical activity:   Cardiac risk factors:   Depression/mood screen:   Depression screen [facility] 2/9 05/08/2018  Decreased Interest 0  Down, Depressed, Hopeless 0  PHQ - 2 Score 0    ADLs:  In your present state of health, do you have any difficulty performing the following activities: 05/08/2018 10/20/2017  Hearing? N N  Vision? N N  Difficulty concentrating or making decisions? N N   Walking or climbing stairs? N N  Dressing or bathing? N N  Doing errands, shopping? N N  Some recent data might be hidden     Cognitive Testing  Alert? Yes  Normal Appearance?Yes  Oriented to person? Yes  Place? Yes   Time? Yes  Recall of three objects?  Yes  Can perform simple calculations? Yes  Displays appropriate judgment?Yes  Can read the correct time from a watch face?Yes  EOL planning:    Review of Systems  Constitutional: Negative for malaise/fatigue and weight loss.  HENT: Negative for hearing loss and tinnitus.   Eyes: Negative for blurred vision and double vision.  Respiratory: Negative for cough, sputum production, shortness of breath and wheezing.   Cardiovascular: Negative for chest pain, palpitations, orthopnea, claudication, leg swelling and PND.       Several near syncopal episodes  Gastrointestinal: Negative for abdominal pain, blood in stool, constipation, diarrhea, heartburn, melena, nausea and vomiting.  Genitourinary: Negative.   Musculoskeletal: Negative for falls, joint pain and myalgias.  Skin: Negative for rash.  Neurological: Positive for headaches (chronic, not new, getting botox injections). Negative for dizziness, tingling, sensory change, loss of consciousness and weakness.  Endo/Heme/Allergies: Negative for polydipsia.  Psychiatric/Behavioral: Negative.  Negative for depression, memory loss, substance abuse and suicidal ideas. The patient is not nervous/anxious and does not have insomnia.   All other systems reviewed and are negative.    Objective:     Today's Vitals   08/07/18 1411  BP: 108/72  Pulse: 71  Temp: (!) 96.8 F (36 C)  Weight: 154 lb (69.9 kg)  PainSc: 8   PainLoc: Hip   Body mass index is 27.28 kg/m.  General : Well sounding patient in no apparent distress HEENT: no hoarseness, no cough for duration of visit Lungs: speaks in complete sentences, no audible wheezing, no apparent distress Neurological: alert, oriented x  3 Psychiatric: pleasant, judgement appropriate    Medicare Attestation I have personally reviewed: The patient's medical and social history Their use of alcohol, tobacco or illicit drugs Their current medications and supplements The patient's functional ability including ADLs,fall risks, home safety risks, cognitive, and hearing and visual impairment Diet and physical activities Evidence for depression or mood disorders  The patient's weight, height, BMI, and visual acuity have been recorded in the chart.  I have made referrals, counseling, and provided education to  the patient based on review of the above and I have provided the patient with a written personalized care plan for preventive services.     Izora Ribas, NP   08/07/2018

## 2018-08-07 ENCOUNTER — Encounter: Payer: Self-pay | Admitting: Adult Health

## 2018-08-07 ENCOUNTER — Other Ambulatory Visit: Payer: Self-pay

## 2018-08-07 ENCOUNTER — Ambulatory Visit: Payer: Medicare Other | Admitting: Adult Health

## 2018-08-07 VITALS — BP 108/72 | HR 71 | Temp 96.8°F | Wt 154.0 lb

## 2018-08-07 DIAGNOSIS — I10 Essential (primary) hypertension: Secondary | ICD-10-CM | POA: Diagnosis not present

## 2018-08-07 DIAGNOSIS — E559 Vitamin D deficiency, unspecified: Secondary | ICD-10-CM

## 2018-08-07 DIAGNOSIS — R6889 Other general symptoms and signs: Secondary | ICD-10-CM | POA: Diagnosis not present

## 2018-08-07 DIAGNOSIS — E663 Overweight: Secondary | ICD-10-CM

## 2018-08-07 DIAGNOSIS — E782 Mixed hyperlipidemia: Secondary | ICD-10-CM

## 2018-08-07 DIAGNOSIS — Z0000 Encounter for general adult medical examination without abnormal findings: Secondary | ICD-10-CM

## 2018-08-07 DIAGNOSIS — R058 Other specified cough: Secondary | ICD-10-CM

## 2018-08-07 DIAGNOSIS — M1990 Unspecified osteoarthritis, unspecified site: Secondary | ICD-10-CM | POA: Diagnosis not present

## 2018-08-07 DIAGNOSIS — Z0001 Encounter for general adult medical examination with abnormal findings: Secondary | ICD-10-CM

## 2018-08-07 DIAGNOSIS — N183 Chronic kidney disease, stage 3 unspecified: Secondary | ICD-10-CM

## 2018-08-07 DIAGNOSIS — K219 Gastro-esophageal reflux disease without esophagitis: Secondary | ICD-10-CM | POA: Diagnosis not present

## 2018-08-07 DIAGNOSIS — E039 Hypothyroidism, unspecified: Secondary | ICD-10-CM

## 2018-08-07 DIAGNOSIS — R55 Syncope and collapse: Secondary | ICD-10-CM

## 2018-08-07 DIAGNOSIS — D696 Thrombocytopenia, unspecified: Secondary | ICD-10-CM

## 2018-08-07 DIAGNOSIS — Z79899 Other long term (current) drug therapy: Secondary | ICD-10-CM

## 2018-08-07 DIAGNOSIS — R7309 Other abnormal glucose: Secondary | ICD-10-CM

## 2018-08-07 DIAGNOSIS — R05 Cough: Secondary | ICD-10-CM

## 2018-08-07 DIAGNOSIS — R928 Other abnormal and inconclusive findings on diagnostic imaging of breast: Secondary | ICD-10-CM

## 2018-08-27 ENCOUNTER — Other Ambulatory Visit: Payer: Medicare Other

## 2018-08-29 ENCOUNTER — Ambulatory Visit
Admission: RE | Admit: 2018-08-29 | Discharge: 2018-08-29 | Disposition: A | Discharge status: Home / Self Care | Payer: Medicare Other | Source: Ambulatory Visit | Attending: Obstetrics and Gynecology | Admitting: Obstetrics and Gynecology

## 2018-08-29 ENCOUNTER — Other Ambulatory Visit: Payer: Self-pay | Admitting: Obstetrics and Gynecology

## 2018-08-29 ENCOUNTER — Other Ambulatory Visit: Payer: Self-pay

## 2018-08-29 DIAGNOSIS — N6011 Diffuse cystic mastopathy of right breast: Secondary | ICD-10-CM

## 2018-11-14 ENCOUNTER — Other Ambulatory Visit: Payer: Self-pay | Admitting: Adult Health

## 2018-11-14 ENCOUNTER — Other Ambulatory Visit: Payer: Self-pay | Admitting: Internal Medicine

## 2018-11-14 DIAGNOSIS — K219 Gastro-esophageal reflux disease without esophagitis: Secondary | ICD-10-CM

## 2018-11-14 MED ORDER — PANTOPRAZOLE SODIUM 40 MG PO TBEC: DELAYED_RELEASE_TABLET | ORAL | 3 refills | Status: DC

## 2018-11-15 ENCOUNTER — Other Ambulatory Visit: Payer: Self-pay | Admitting: Internal Medicine

## 2018-11-17 ENCOUNTER — Encounter: Payer: Self-pay | Admitting: Internal Medicine

## 2018-11-17 NOTE — Patient Instructions (Signed)

## 2018-11-17 NOTE — Progress Notes (Signed)
History of Present Illness:      This very nice 73 y.o. MWF presents for 6 month follow up with HTN, HLD, Pre-Diabetes and Vitamin D Deficiency.  Patient has hx/o GERD controlled on her meds.      Patient has hx/o labile HTN followed since the 1990's & BP has been controlled at home. Today's BP was elevated at 146/92 and rechecked at 172/94.  Patient has CKD3 (GFR 40) and hx/o multiple renal cysts. She has been followed several years by Dr Mack Hook and reports she has an upcoming appt. Patient has had no complaints of any cardiac type chest pain, palpitations, dyspnea / orthopnea / PND, dizziness, claudication, or dependent edema.      Hyperlipidemia is controlled with diet & meds. Patient denies myalgias or other med SE's. Last Lipids were at goal: Lab Results  Component Value Date   CHOL 156 05/08/2018   HDL 67 05/08/2018   LDLCALC 69 05/08/2018   TRIG 118 05/08/2018   CHOLHDL 2.3 05/08/2018       Also, the patient has history of PreDiabetes (A1c 6.0% / 2012) and has had no symptoms of reactive hypoglycemia, diabetic polys, paresthesias or visual blurring.  Last A1c was Normal & at goal: Lab Results  Component Value Date   HGBA1C 5.4 05/08/2018       Patient was dx'd Hypothyroid circa 1990's & has been on replacement since.       Further, the patient also has history of Vitamin D Deficiency("39" / 2012) and supplements vitamin D without any suspected side-effects. Last vitamin D was at goal: Lab Results  Component Value Date   VD25OH 89 05/08/2018   Current Outpatient Medications on File Prior to Visit  Medication Sig  . Ascorbic Acid (VITAMIN C) 500 MG CAPS Take 1 capsule by mouth daily.  [name] Kitchen aspirin 81 MG tablet Take 81 mg by mouth daily.  [name] Kitchen BOTOX 100 units SOLR injection Every 6 to 8 weeks for headaches  . Calcium Carbonate-Vit D-Min (CALCIUM 1200 PO) Take 1,200 Units by mouth daily.  . cetirizine (ZYRTEC) 10 MG tablet Take 10 mg by mouth daily.  .  Cholecalciferol (VITAMIN D PO) Take 5,000 Units by mouth daily.  [name] Kitchen gabapentin (NEURONTIN) 600 MG tablet Take 600 mg by mouth 4 (four) times daily.   . hyoscyamine (LEVSIN SL) 0.125 MG SL tablet DISSOLVE 1 TO 2 TABLETS UNDER THE TONGUE EVERY 4 HOURS AS NEEDED FOR NAUSEA OR CRAMPING OR DIARRHEA  . levothyroxine (SYNTHROID, LEVOTHROID) 50 MCG tablet TAKE 1 TABLET BY MOUTH EVERY MORNING ON AN EMPTY STOMACH  . OVER THE COUNTER MEDICATION Take by mouth daily. Multi Vitamin: Taking Tangy Tangerine tabs 2.0 # 2 tabs daily.  . pantoprazole (PROTONIX) 40 MG tablet Take 1 tablet Daily for Acid Indigestion & Heartburn  . pravastatin (PRAVACHOL) 40 MG tablet TAKE 1 TABLET BY MOUTH AT BEDTIME   No current facility-administered medications on file prior to visit.    No Known Allergies  PMHx:   Past Medical History:  Diagnosis Date  . Degenerative joint disease   . Diabetes mellitus without complication ([place])   . GERD (gastroesophageal reflux disease)   . Hyperlipidemia   . Hypertension   . Migraines    neurontin helps  . Pre-diabetes   . Thyroid disease    Immunization History  Administered Date(s) Administered  . Influenza, High Dose Seasonal PF 12/29/2013, 01/15/2015, 02/01/2016, 02/28/2017, 02/07/2018  . Influenza-Unspecified 01/24/2011, 02/02/2012  . Pneumococcal  Conjugate-13 10/28/2014  . Pneumococcal-Unspecified 11/28/2011  . Td 11/17/2010  . Tdap 11/12/2014  . Zoster 11/17/2010   Past Surgical History:  Procedure Laterality Date  . CHOLECYSTECTOMY  1996   open  . COLONOSCOPY  2009   recommended 10 year f/u  . HERNIA REPAIR  1985   umb hernia rpr  . HERNIA REPAIR  2005 & 2009   ventral hernia rpr  . PILONIDAL CYST EXCISION  1972  . TONSILLECTOMY    . UPPER GI ENDOSCOPY    . VENTRAL HERNIA REPAIR      FHx:    Reviewed / unchanged  SHx:    Reviewed / unchanged   Systems Review:  Constitutional: Denies fever, chills, wt changes, headaches, insomnia, fatigue, night sweats,  change in appetite. Eyes: Denies redness, blurred vision, diplopia, discharge, itchy, watery eyes.  ENT: Denies discharge, congestion, post nasal drip, epistaxis, sore throat, earache, hearing loss, dental pain, tinnitus, vertigo, sinus pain, snoring.  CV: Denies chest pain, palpitations, irregular heartbeat, syncope, dyspnea, diaphoresis, orthopnea, PND, claudication or edema. Respiratory: denies cough, dyspnea, DOE, pleurisy, hoarseness, laryngitis, wheezing.  Gastrointestinal: Denies dysphagia, odynophagia, heartburn, reflux, water brash, abdominal pain or cramps, nausea, vomiting, bloating, diarrhea, constipation, hematemesis, melena, hematochezia  or hemorrhoids. Genitourinary: Denies dysuria, frequency, urgency, nocturia, hesitancy, discharge, hematuria or flank pain. Musculoskeletal: Denies arthralgias, myalgias, stiffness, jt. swelling, pain, limping or strain/sprain.  Skin: Denies pruritus, rash, hives, warts, acne, eczema or change in skin lesion(s). Neuro: No weakness, tremor, incoordination, spasms, paresthesia or pain. Psychiatric: Denies confusion, memory loss or sensory loss. Endo: Denies change in weight, skin or hair change.  Heme/Lymph: No excessive bleeding, bruising or enlarged lymph nodes.  Physical Exam  BP (!) 146/92   Pulse 64   Temp (!) 97.5 F (36.4 C)   Resp 16   Ht 5\' 3"  (1.6 m)   Wt 158 lb 9.6 oz (71.9 kg)   BMI 28.09 kg/m   BP recheck at 172/94  Appears  well nourished, well groomed  and in no distress.  Eyes: PERRLA, EOMs, conjunctiva no swelling or erythema. Sinuses: No frontal/maxillary tenderness ENT/Mouth: EAC's clear, TM's nl w/o erythema, bulging. Nares clear w/o erythema, swelling, exudates. Oropharynx clear without erythema or exudates. Oral hygiene is good. Tongue normal, non obstructing. Hearing intact.  Neck: Supple. Thyroid not palpable. Car 2+/2+ without bruits, nodes or JVD. Chest: Respirations nl with BS clear & equal w/o rales, rhonchi,  wheezing or stridor.  Cor: Heart sounds normal w/ regular rate and rhythm without sig. murmurs, gallops, clicks or rubs. Peripheral pulses normal and equal  without edema.  Abdomen: Soft & bowel sounds normal. Non-tender w/o guarding, rebound, hernias, masses or organomegaly.  Lymphatics: Unremarkable.  Musculoskeletal: Full ROM all peripheral extremities, joint stability, 5/5 strength and normal gait.  Skin: Warm, dry without exposed rashes, lesions or ecchymosis apparent.  Neuro: Cranial nerves intact, reflexes equal bilaterally. Sensory-motor testing grossly intact. Tendon reflexes grossly intact.  Pysch: Alert & oriented x 3.  Insight and judgement nl & appropriate. No ideations.  Assessment and Plan:  1. Labile hypertension  - Monitor blood pressure 2 x /day and call if it remains elevated.  - Continue DASH diet.  Reminder to go to the ER if any CP,  SOB, nausea, dizziness, severe HA, changes vision/speech.  - CBC with Differential/Platelet - COMPLETE METABOLIC PANEL WITH GFR - Magnesium - TSH  2. Hyperlipidemia, mixed  - Continue diet/meds, exercise,& lifestyle modifications.  - Continue monitor periodic cholesterol/liver & renal functions   -  Lipid panel - TSH  3. Abnormal glucose  - Continue diet, exercise  - Lifestyle modifications.  - Monitor appropriate labs.  - Hemoglobin A1c - Insulin, random  4. Vitamin D deficiency  - Continue supplementation.  - VITAMIN D 25 Hydroxyl  5. Hypothyroidism  - TSH  6. Medication management  - CBC with Differential/Platelet - COMPLETE METABOLIC PANEL WITH GFR - Magnesium - Lipid panel - TSH - Hemoglobin A1c - Insulin, random - VITAMIN D 25 Hydroxyl        Discussed  regular exercise, BP monitoring, weight control to achieve/maintain BMI less than 25 and discussed med and SE's. Recommended labs to assess and monitor clinical status with further disposition pending results of labs.  I discussed the assessment and  treatment plan with the patient. The patient was provided an opportunity to ask questions and all were answered. The patient agreed with the plan and demonstrated an understanding of the instructions.  I provided over 30 minutes of exam, counseling, chart review and  complex critical decision making.   Kirtland Bouchard, MD

## 2018-11-18 ENCOUNTER — Other Ambulatory Visit: Payer: Self-pay

## 2018-11-18 ENCOUNTER — Encounter: Payer: Self-pay | Admitting: Internal Medicine

## 2018-11-18 ENCOUNTER — Ambulatory Visit: Payer: Medicare Other | Admitting: Internal Medicine

## 2018-11-18 VITALS — BP 146/92 | HR 64 | Temp 97.5°F | Resp 16 | Ht 63.0 in | Wt 158.6 lb

## 2018-11-18 DIAGNOSIS — E559 Vitamin D deficiency, unspecified: Secondary | ICD-10-CM | POA: Diagnosis not present

## 2018-11-18 DIAGNOSIS — E782 Mixed hyperlipidemia: Secondary | ICD-10-CM

## 2018-11-18 DIAGNOSIS — E039 Hypothyroidism, unspecified: Secondary | ICD-10-CM

## 2018-11-18 DIAGNOSIS — R7309 Other abnormal glucose: Secondary | ICD-10-CM

## 2018-11-18 DIAGNOSIS — Z79899 Other long term (current) drug therapy: Secondary | ICD-10-CM

## 2018-11-18 DIAGNOSIS — R0989 Other specified symptoms and signs involving the circulatory and respiratory systems: Secondary | ICD-10-CM | POA: Diagnosis not present

## 2018-11-19 LAB — CBC WITH DIFFERENTIAL/PLATELET
Absolute Monocytes: 689 cells/uL (ref 200–950)
Basophils Absolute: 57 cells/uL (ref 0–200)
Basophils Relative: 0.8 %
Eosinophils Absolute: 192 cells/uL (ref 15–500)
Eosinophils Relative: 2.7 %
HCT: 36.8 % (ref 35.0–45.0)
Hemoglobin: 12.2 g/dL (ref 11.7–15.5)
Lymphs Abs: 2194 cells/uL (ref 850–3900)
MCH: 30.5 pg (ref 27.0–33.0)
MCHC: 33.2 g/dL (ref 32.0–36.0)
MCV: 92 fL (ref 80.0–100.0)
MPV: 11.4 fL (ref 7.5–12.5)
Monocytes Relative: 9.7 %
Neutro Abs: 3969 cells/uL (ref 1500–7800)
Neutrophils Relative %: 55.9 %
Platelets: 84 10*3/uL — ABNORMAL LOW (ref 140–400)
RBC: 4 10*6/uL (ref 3.80–5.10)
RDW: 12.6 % (ref 11.0–15.0)
Total Lymphocyte: 30.9 %
WBC: 7.1 10*3/uL (ref 3.8–10.8)

## 2018-11-19 LAB — LIPID PANEL
Cholesterol: 156 mg/dL (ref <200)
HDL: 57 mg/dL (ref >=50)
LDL Cholesterol (Calc): 75 mg/dL (calc)
Non-HDL Cholesterol (Calc): 99 mg/dL (calc) (ref <130)
Total CHOL/HDL Ratio: 2.7 (calc) (ref <5.0)
Triglycerides: 165 mg/dL — ABNORMAL HIGH (ref <150)

## 2018-11-19 LAB — COMPLETE METABOLIC PANEL WITH GFR
AG Ratio: 1.8 (calc) (ref 1.0–2.5)
ALT: 17 U/L (ref 6–29)
AST: 21 U/L (ref 10–35)
Albumin: 4.2 g/dL (ref 3.6–5.1)
Alkaline phosphatase (APISO): 75 U/L (ref 37–153)
BUN/Creatinine Ratio: 18 (calc) (ref 6–22)
BUN: 24 mg/dL (ref 7–25)
CO2: 27 mmol/L (ref 20–32)
Calcium: 9.8 mg/dL (ref 8.6–10.4)
Chloride: 105 mmol/L (ref 98–110)
Creat: 1.32 mg/dL — ABNORMAL HIGH (ref 0.60–0.93)
GFR, Est African American: 46 mL/min/{1.73_m2} — ABNORMAL LOW (ref >=60)
GFR, Est Non African American: 40 mL/min/{1.73_m2} — ABNORMAL LOW (ref >=60)
Globulin: 2.4 g/dL (calc) (ref 1.9–3.7)
Glucose, Bld: 77 mg/dL (ref 65–99)
Potassium: 4.5 mmol/L (ref 3.5–5.3)
Sodium: 140 mmol/L (ref 135–146)
Total Bilirubin: 0.6 mg/dL (ref 0.2–1.2)
Total Protein: 6.6 g/dL (ref 6.1–8.1)

## 2018-11-19 LAB — HEMOGLOBIN A1C
Hgb A1c MFr Bld: 5.5 % of total Hgb (ref <5.7)
Mean Plasma Glucose: 111 (calc)
eAG (mmol/L): 6.2 (calc)

## 2018-11-19 LAB — INSULIN, RANDOM: Insulin: 10.5 u[IU]/mL

## 2018-11-19 LAB — TSH: TSH: 2.48 mIU/L (ref 0.40–4.50)

## 2018-11-19 LAB — MAGNESIUM: Magnesium: 2.1 mg/dL (ref 1.5–2.5)

## 2018-11-19 LAB — VITAMIN D 25 HYDROXY (VIT D DEFICIENCY, FRACTURES): Vit D, 25-Hydroxy: 91 ng/mL (ref 30–100)

## 2018-11-21 ENCOUNTER — Encounter: Payer: Self-pay | Admitting: *Deleted

## 2018-12-30 ENCOUNTER — Ambulatory Visit (INDEPENDENT_AMBULATORY_CARE_PROVIDER_SITE_OTHER): Payer: Medicare Other

## 2018-12-30 ENCOUNTER — Other Ambulatory Visit: Payer: Self-pay

## 2018-12-30 VITALS — Temp 97.7°F | Ht 63.0 in | Wt 159.2 lb

## 2018-12-30 DIAGNOSIS — Z23 Encounter for immunization: Secondary | ICD-10-CM | POA: Diagnosis not present

## 2018-12-30 NOTE — Progress Notes (Signed)
Patient reports for HD flu. Given IM

## 2019-01-13 ENCOUNTER — Other Ambulatory Visit: Payer: Self-pay | Admitting: *Deleted

## 2019-01-13 MED ORDER — LEVOTHYROXINE SODIUM 50 MCG PO TABS: ORAL_TABLET | ORAL | 1 refills | Status: DC

## 2019-02-07 ENCOUNTER — Other Ambulatory Visit: Payer: Self-pay | Admitting: Obstetrics and Gynecology

## 2019-02-07 DIAGNOSIS — R102 Pelvic and perineal pain: Secondary | ICD-10-CM

## 2019-02-10 ENCOUNTER — Ambulatory Visit
Admission: RE | Admit: 2019-02-10 | Discharge: 2019-02-10 | Disposition: A | Discharge status: Home / Self Care | Payer: Medicare Other | Source: Ambulatory Visit | Attending: Obstetrics and Gynecology | Admitting: Obstetrics and Gynecology

## 2019-02-10 DIAGNOSIS — R102 Pelvic and perineal pain: Secondary | ICD-10-CM

## 2019-02-18 NOTE — Progress Notes (Signed)
Patient ID: Jillian Rodgers, female   DOB: Sep 03, 1945, 73 y.o.   MRN: [id number]  FOLLOW UP  Assessment:   Hypothyroidism, unspecified hypothyroidism type -cont levothyroxine -PATIENT COMPLAINS OF CONSTIPATION AND FATIGUE, SHE IS ON BIOTIN- WILL STOP BIOTIN AND FOLLOW UP 2 WEEKS LAB ONLY TSH  Essential hypertension - continue medications, DASH diet, exercise and monitor at home. Call if greater than 130/80.   Hyperlipidemia -cont diet and exercise -cont meds -monitor  Vitamin D deficiency -cont supplement   Medication management - CBC with Differential/Platelet - BASIC METABOLIC PANEL WITH GFR - Hepatic function panel  Thrombocytopenia (HCC) -     CBC with Differential/Platelet - monitor   Over 30 minutes of exam, counseling, chart review, and critical decision making was performed  Future Appointments  Date Time Provider [place]  03/03/2019 11:00 AM [facility] DIAG [place]OM[place] 1 [facility]  03/03/2019 11:10 AM [facility] [place] 1 [facility]  05/30/2019 10:00 AM Unk Pinto, MD [facility] None  08/28/2019  2:00 PM Liane Comber, NP [facility] None     Subjective:   Jillian Rodgers is a 73 y.o. female who presents for follow up for HTN, chol, CKD.   Her blood pressure has been controlled at home, today their BP is BP: 104/76 She does workout. She denies chest pain, shortness of breath.   She has had right lateral hip and lower back pain x 2-3 weeks, intermittent  She also has noticed a left inguinal bulge with discomfort x 2-3 weeks, had [place] that did not show hernia, however a CT scan from 2018 showed left inguinal hernia so she has been referred to [place] surgery. No acute pain.   She is on botox and gabapentin for migraines, follow with neurology.   She is on cholesterol medication and denies myalgias. Her cholesterol is at goal. The cholesterol last visit was:   Lab Results  Component Value Date   CHOL 156 11/18/2018    HDL 57 11/18/2018   LDLCALC 75 11/18/2018   TRIG 165 (H) 11/18/2018   CHOLHDL 2.7 11/18/2018   She has been working on diet and exercise for prediabetes, and denies foot ulcerations, hyperglycemia, hypoglycemia , increased appetite, nausea, paresthesia of the feet, polydipsia, polyuria, visual disturbances, vomiting and weight loss. Last A1C in the office was:  Lab Results  Component Value Date   HGBA1C 5.5 11/18/2018   Last GFR  Lab Results  Component Value Date   GFRNONAA 40 (L) 11/18/2018   Patient is on Vitamin D supplement. Lab Results  Component Value Date   VD25OH 91 11/18/2018     She is on thyroid medication. Her medication was not changed last visit.  SHE IS ON BIOTIN and complains of fatigue.  Lab Results  Component Value Date   TSH 2.48 11/18/2018  .  BMI is Body mass index is 27.53 kg/m., she is working on diet and exercise. Wt Readings from Last 3 Encounters:  02/19/19 155 lb 6.4 oz (70.5 kg)  12/30/18 159 lb 3.2 oz (72.2 kg)  11/18/18 158 lb 9.6 oz (71.9 kg)     Medication Review Current Outpatient Medications on File Prior to Visit  Medication Sig Dispense Refill  . Ascorbic Acid (VITAMIN C) 500 MG CAPS Take 1 capsule by mouth daily.    [name] Kitchen aspirin 81 MG tablet Take 81 mg by mouth daily.    [name] Kitchen BOTOX 100 units SOLR injection Every 6 to 8 weeks for headaches    . Calcium  Carbonate-Vit D-Min (CALCIUM 1200 PO) Take 1,200 Units by mouth daily.    . cetirizine (ZYRTEC) 10 MG tablet Take 10 mg by mouth daily.    . Cholecalciferol (VITAMIN D PO) Take 5,000 Units by mouth daily.    [name] Kitchen gabapentin (NEURONTIN) 600 MG tablet Take 600 mg by mouth 4 (four) times daily.     . hyoscyamine (LEVSIN SL) 0.125 MG SL tablet DISSOLVE 1 TO 2 TABLETS UNDER THE TONGUE EVERY 4 HOURS AS NEEDED FOR NAUSEA OR CRAMPING OR DIARRHEA 1350 tablet 3  . levothyroxine (SYNTHROID) 50 MCG tablet TAKE 1 TABLET BY MOUTH EVERY MORNING ON AN EMPTY STOMACH 90 tablet 1  . OVER THE COUNTER MEDICATION Take  by mouth daily. Multi Vitamin: Taking Tangy [place]anger[place] tabs 2.0 # 2 tabs daily.    . pantoprazole (PROTONIX) 40 MG tablet Take 1 tablet Daily for Acid Indigestion & Heartburn 90 tablet 3  . pravastatin (PRAVACHOL) 40 MG tablet TAKE 1 TABLET BY MOUTH AT BEDTIME 90 tablet 1   No current facility-administered medications on file prior to visit.     Current Problems (verified) Patient Active Problem List   Diagnosis Date Noted  . Inguinal hernia 02/19/2019  . Mammogram abnormal 08/07/2018  . Near syncope 08/07/2018  . Thrombocytopenia ([place]) 11/25/2015  . Encounter for Medicare annual wellness exam 06/14/2015  . Overweight (BMI 25.0-29.9) 01/28/2015  . CKD (chronic kidney disease) stage 3, GFR 30-59 ml/min (HCC) 01/19/2014  . Upper airway cough syndrome 12/16/2013  . Vitamin D deficiency 05/21/2013  . Other abnormal glucose (prediabetes) 05/21/2013  . Medication management 05/21/2013  . Essential hypertension 01/31/2013  . Degenerative joint disease   . GERD (gastroesophageal reflux disease)   . Hyperlipidemia   . Hypothyroidism     Allergies No Known Allergies  Surgical History: reviewed and unchanged Family History: reviewed and unchanged Social History: reviewed and unchanged  Review of Systems  Constitutional: Negative for chills, diaphoresis, fever, malaise/fatigue and weight loss.  HENT: Negative for congestion, ear discharge, ear pain, hearing loss, nosebleeds, sinus pain, sore throat and tinnitus.   Respiratory: Negative for cough, shortness of breath, wheezing and stridor.   Cardiovascular: Negative.  Negative for chest pain and claudication.  Gastrointestinal: Positive for abdominal pain, constipation and heartburn. Negative for blood in stool, diarrhea, melena, nausea and vomiting.  Genitourinary: Negative.   Musculoskeletal: Positive for joint pain (knees and wrist, seeing ortho, doing better). Negative for falls.  Skin: Negative.  Negative for rash.  Neurological:  Negative for dizziness, tingling, tremors, sensory change, speech change, focal weakness, seizures, loss of consciousness and weakness.  Psychiatric/Behavioral: Negative.      Objective:   Today's Vitals   02/19/19 1442  BP: 104/76  Pulse: 72  Resp: 16  Temp: (!) 97.4 F (36.3 C)  Weight: 155 lb 6.4 oz (70.5 kg)  Height: 5\' 3"  (1.6 m)   Body mass index is 27.53 kg/m.  General appearance: alert, no distress, WD/WN,  female HEENT: normocephalic, sclerae anicteric, TMs pearly, nares patent, no discharge or erythema, pharynx normal Oral cavity: MMM, no lesions Neck: supple, no lymphadenopathy, no thyromegaly, no masses Heart: RRR, normal S1, S2, no murmurs Lungs: CTA bilaterally, no wheezes, rhonchi, or rales Abdomen: +bs, soft, + tender epigastric, non distended, no masses, no hepatomegaly, no splenomegaly Musculoskeletal: nontender, no swelling, no obvious deformity Extremities: no edema, no cyanosis, no clubbing Pulses: 2+ symmetric, upper and lower extremities, normal cap refill Neurological: alert, oriented x 3, CN2-12 intact, strength normal upper extremities and  lower extremities, sensation normal throughout, DTRs 2+ throughout, no cerebellar signs, gait normal Psychiatric: normal affect, behavior normal, pleasant    Vicie Mutters, PA-C   02/19/2019

## 2019-02-19 ENCOUNTER — Other Ambulatory Visit: Payer: Self-pay

## 2019-02-19 ENCOUNTER — Ambulatory Visit: Payer: Medicare Other | Admitting: Physician Assistant

## 2019-02-19 VITALS — BP 104/76 | HR 72 | Temp 97.4°F | Resp 16 | Ht 63.0 in | Wt 155.4 lb

## 2019-02-19 DIAGNOSIS — Z79899 Other long term (current) drug therapy: Secondary | ICD-10-CM

## 2019-02-19 DIAGNOSIS — R7309 Other abnormal glucose: Secondary | ICD-10-CM | POA: Diagnosis not present

## 2019-02-19 DIAGNOSIS — K4090 Unilateral inguinal hernia, without obstruction or gangrene, not specified as recurrent: Secondary | ICD-10-CM

## 2019-02-19 DIAGNOSIS — I10 Essential (primary) hypertension: Secondary | ICD-10-CM | POA: Diagnosis not present

## 2019-02-19 DIAGNOSIS — N183 Chronic kidney disease, stage 3 unspecified: Secondary | ICD-10-CM

## 2019-02-19 DIAGNOSIS — E039 Hypothyroidism, unspecified: Secondary | ICD-10-CM | POA: Diagnosis not present

## 2019-02-19 DIAGNOSIS — E782 Mixed hyperlipidemia: Secondary | ICD-10-CM | POA: Diagnosis not present

## 2019-02-19 DIAGNOSIS — D696 Thrombocytopenia, unspecified: Secondary | ICD-10-CM

## 2019-02-19 HISTORY — DX: Unilateral inguinal hernia, without obstruction or gangrene, not specified as recurrent: K40.90

## 2019-02-19 NOTE — Patient Instructions (Addendum)
Inguinal Hernia, Adult An inguinal hernia is when fat or your intestines push through a weak spot in a muscle where your leg meets your lower belly (groin). This causes a rounded lump (bulge). This kind of hernia could also be:  In your scrotum, if you are female.  In folds of skin around your vagina, if you are female. There are three types of inguinal hernias. These include:  Hernias that can be pushed back into the belly (are reducible). This type rarely causes pain.  Hernias that cannot be pushed back into the belly (are incarcerated).  Hernias that cannot be pushed back into the belly and lose their blood supply (are strangulated). This type needs emergency surgery. If you do not have symptoms, you may not need treatment. If you have symptoms or a large hernia, you may need surgery. Follow these instructions at home: Lifestyle  Do these things if told by your doctor so you do not have trouble pooping (constipation): ? Drink enough fluid to keep your pee (urine) pale yellow. ? Eat foods that have a lot of fiber. These include fresh fruits and vegetables, whole grains, and beans. ? Limit foods that are high in fat and processed sugars. These include foods that are fried or sweet. ? Take medicine for trouble pooping.  Avoid lifting heavy objects.  Avoid standing for long amounts of time.  Do not use any products that contain nicotine or tobacco. These include cigarettes and e-cigarettes. If you need help quitting, ask your doctor.  Stay at a healthy weight. General instructions  You may try to push your hernia in by very gently pressing on it when you are lying down. Do not try to force the bulge back in if it will not push in easily.  Watch your hernia for any changes in shape, size, or color. Tell your doctor if you see any changes.  Take over-the-counter and prescription medicines only as told by your doctor.  Keep all follow-up visits as told by your doctor. This is  important. Contact a doctor if:  You have a fever.  You have new symptoms.  Your symptoms get worse. Get help right away if:  The area where your leg meets your lower belly has: ? Pain that gets worse suddenly. ? A bulge that gets bigger suddenly, and it does not get smaller after that. ? A bulge that turns red or purple. ? A bulge that is painful when you touch it.  You are a man, and your scrotum: ? Suddenly feels painful. ? Suddenly changes in size.  You cannot push the hernia in by very gently pressing on it when you are lying down. Do not try to force the bulge back in if it will not push in easily.  You feel sick to your stomach (nauseous), and that feeling does not go away.  You throw up (vomit), and that keeps happening.  You have a fast heartbeat.  You cannot poop (have a bowel movement) or pass gas. These symptoms may be an emergency. Do not wait to see if the symptoms will go away. Get medical help right away. Call your local emergency services (911 in the [place] Summary  An inguinal hernia is when fat or your intestines push through a weak spot in a muscle where your leg meets your lower belly (groin). This causes a rounded lump (bulge).  If you do not have symptoms, you may not need treatment. If you have symptoms or a large hernia, you   may need surgery.  Avoid lifting heavy objects. Also avoid standing for long amounts of time.  Do not try to force the bulge back in if it will not push in easily. This information is not intended to replace advice given to you by your health care provider. Make sure you discuss any questions you have with your health care provider. Document Released: 05/04/2006 Document Revised: 05/05/2017 Document Reviewed: 01/03/2017 Elsevier Patient Education  2020 [employer].  Piriformis Syndrome Rehab Ask your health care provider which exercises are safe for you. Do exercises exactly as told by your health care provider and adjust  them as directed. It is normal to feel mild stretching, pulling, tightness, or discomfort as you do these exercises. Stop right away if you feel sudden pain or your pain gets worse. Do not begin these exercises until told by your health care provider. Stretching and range-of-motion exercises These exercises warm up your muscles and joints and improve the movement and flexibility of your hip and pelvis. The exercises also help to relieve pain, numbness, and tingling. Hip rotation This is an exercise in which you lie on your back and stretch the muscles that rotate your hip (hip rotators) to stretch your buttocks. 1. Lie on your back on a firm surface. 2. Pull your left / right knee toward your same shoulder with your left / right hand until your knee is pointing toward the ceiling. Hold your left / right ankle with your other hand. 3. Keeping your knee steady, gently pull your left / right ankle toward your other shoulder until you feel a stretch in your buttocks. 4. Hold this position for __________ seconds. Repeat __________ times. Complete this exercise __________ times a day. Hip extensor This is an exercise in which you lie on your back and pull your knee to your chest. 1. Lie on your back on a firm surface. Both of your legs should be straight. 2. Pull your left / right knee to your chest. Hold your leg in this position by holding onto the back of your thigh or the front of your knee. 3. Hold this position for __________ seconds. 4. Slowly return to the starting position. Repeat __________ times. Complete this exercise __________ times a day. Strengthening exercises These exercises build strength and endurance in your hip and thigh muscles. Endurance is the ability to use your muscles for a long time, even after they get tired. Straight leg raises, side-lying This exercise strengthens the muscles that rotate the leg at the hip and move it away from your body (hip abductors). 1. Lie on your  side with your left / right leg in the top position. Lie so your head, shoulder, knee, and hip line up. Bend your bottom knee to help you balance. 2. Lift your top leg 4-6 inches (10-15 cm) while keeping your toes pointed straight ahead. 3. Hold this position for __________ seconds. 4. Slowly lower your leg to the starting position. 5. Let your muscles relax completely after each repetition. Repeat __________ times. Complete this exercise __________ times a day. Hip abduction and rotation This is sometimes called quadruped (on hands and knees) exercises. 1. Get on your hands and knees on a firm, lightly padded surface. Your hands should be directly below your shoulders, and your knees should be directly below your hips. 2. Lift your left / right knee out to the side. Keep your knee bent. Do not twist your body. 3. Hold this position for __________ seconds. 4. Slowly lower  your leg. Repeat __________ times. Complete this exercise __________ times a day. Straight leg raises, face-down This exercise stretches the muscles that move your hips away from the front of the pelvis (hip extensors). 1. Lie on your abdomen on a bed or a firm surface with a pillow under your hips. 2. Squeeze your buttocks muscles and lift your left / right leg about 4-6 inches (10-15 cm) off the bed. Do not let your back arch. 3. Hold this position for __________ seconds. 4. Slowly lower your leg to the starting position. 5. Let your muscles relax completely after each repetition. Repeat __________ times. Complete this exercise __________ times a day. This information is not intended to replace advice given to you by your health care provider. Make sure you discuss any questions you have with your health care provider. Document Released: 04/03/2005 Document Revised: 07/25/2018 Document Reviewed: 01/24/2018 Elsevier Patient Education  2020 [employer].

## 2019-02-20 LAB — CBC WITH DIFFERENTIAL/PLATELET
Absolute Monocytes: 624 cells/uL (ref 200–950)
Basophils Absolute: 39 cells/uL (ref 0–200)
Basophils Relative: 0.5 %
Eosinophils Absolute: 250 cells/uL (ref 15–500)
Eosinophils Relative: 3.2 %
HCT: 36.3 % (ref 35.0–45.0)
Hemoglobin: 12.4 g/dL (ref 11.7–15.5)
Lymphs Abs: 1895 cells/uL (ref 850–3900)
MCH: 30.7 pg (ref 27.0–33.0)
MCHC: 34.2 g/dL (ref 32.0–36.0)
MCV: 89.9 fL (ref 80.0–100.0)
MPV: 11.4 fL (ref 7.5–12.5)
Monocytes Relative: 8 %
Neutro Abs: 4992 cells/uL (ref 1500–7800)
Neutrophils Relative %: 64 %
Platelets: 95 10*3/uL — ABNORMAL LOW (ref 140–400)
RBC: 4.04 10*6/uL (ref 3.80–5.10)
RDW: 11.9 % (ref 11.0–15.0)
Total Lymphocyte: 24.3 %
WBC: 7.8 10*3/uL (ref 3.8–10.8)

## 2019-02-20 LAB — COMPLETE METABOLIC PANEL WITH GFR
AG Ratio: 1.8 (calc) (ref 1.0–2.5)
ALT: 15 U/L (ref 6–29)
AST: 18 U/L (ref 10–35)
Albumin: 4 g/dL (ref 3.6–5.1)
Alkaline phosphatase (APISO): 76 U/L (ref 37–153)
BUN/Creatinine Ratio: 22 (calc) (ref 6–22)
BUN: 28 mg/dL — ABNORMAL HIGH (ref 7–25)
CO2: 26 mmol/L (ref 20–32)
Calcium: 10 mg/dL (ref 8.6–10.4)
Chloride: 104 mmol/L (ref 98–110)
Creat: 1.25 mg/dL — ABNORMAL HIGH (ref 0.60–0.93)
GFR, Est African American: 49 mL/min/{1.73_m2} — ABNORMAL LOW (ref >=60)
GFR, Est Non African American: 42 mL/min/{1.73_m2} — ABNORMAL LOW (ref >=60)
Globulin: 2.2 g/dL (calc) (ref 1.9–3.7)
Glucose, Bld: 79 mg/dL (ref 65–99)
Potassium: 4.2 mmol/L (ref 3.5–5.3)
Sodium: 140 mmol/L (ref 135–146)
Total Bilirubin: 0.4 mg/dL (ref 0.2–1.2)
Total Protein: 6.2 g/dL (ref 6.1–8.1)

## 2019-02-20 LAB — LIPID PANEL
Cholesterol: 163 mg/dL (ref <200)
HDL: 54 mg/dL (ref >=50)
LDL Cholesterol (Calc): 79 mg/dL (calc)
Non-HDL Cholesterol (Calc): 109 mg/dL (calc) (ref <130)
Total CHOL/HDL Ratio: 3 (calc) (ref <5.0)
Triglycerides: 209 mg/dL — ABNORMAL HIGH (ref <150)

## 2019-02-20 LAB — MAGNESIUM: Magnesium: 2 mg/dL (ref 1.5–2.5)

## 2019-02-20 LAB — HEMOGLOBIN A1C
Hgb A1c MFr Bld: 5.6 % of total Hgb (ref <5.7)
Mean Plasma Glucose: 114 (calc)
eAG (mmol/L): 6.3 (calc)

## 2019-03-03 ENCOUNTER — Ambulatory Visit
Admission: RE | Admit: 2019-03-03 | Discharge: 2019-03-03 | Disposition: A | Discharge status: Home / Self Care | Payer: Medicare Other | Source: Ambulatory Visit | Attending: Obstetrics and Gynecology | Admitting: Obstetrics and Gynecology

## 2019-03-03 ENCOUNTER — Other Ambulatory Visit: Payer: Self-pay

## 2019-03-03 DIAGNOSIS — N6011 Diffuse cystic mastopathy of right breast: Secondary | ICD-10-CM

## 2019-03-05 ENCOUNTER — Other Ambulatory Visit: Payer: Self-pay

## 2019-03-05 ENCOUNTER — Other Ambulatory Visit: Payer: Medicare Other

## 2019-03-05 DIAGNOSIS — E039 Hypothyroidism, unspecified: Secondary | ICD-10-CM

## 2019-03-06 LAB — TSH: TSH: 1.91 mIU/L (ref 0.40–4.50)

## 2019-05-12 ENCOUNTER — Ambulatory Visit: Payer: Medicare PPO | Attending: Internal Medicine

## 2019-05-12 DIAGNOSIS — Z23 Encounter for immunization: Secondary | ICD-10-CM

## 2019-05-12 NOTE — Progress Notes (Signed)
   Covid-19 Vaccination Clinic  Name:  Jillian Rodgers    MRN: [id number] DOB: 1946/04/17  05/12/2019  Jillian Rodgers was observed post Covid-19 immunization for 15 minutes without incidence. She was provided with Vaccine Information Sheet and instruction to access the V-Safe system.   Jillian Rodgers was instructed to call 911 with any severe reactions post vaccine: [name] Kitchen Difficulty breathing  . Swelling of your face and throat  . A fast heartbeat  . A bad rash all over your body  . Dizziness and weakness    Immunizations Administered    Name Date Dose VIS Date Route   Pfizer COVID-19 Vaccine 05/12/2019  8:34 AM 0.3 mL 03/28/2019 Intramuscular   Manufacturer: [place]   Lot: [id number]   [place]: [id number]

## 2019-05-13 ENCOUNTER — Other Ambulatory Visit: Payer: Self-pay | Admitting: Adult Health

## 2019-05-28 DIAGNOSIS — M791 Myalgia, unspecified site: Secondary | ICD-10-CM | POA: Diagnosis not present

## 2019-05-28 DIAGNOSIS — G43019 Migraine without aura, intractable, without status migrainosus: Secondary | ICD-10-CM | POA: Diagnosis not present

## 2019-05-28 DIAGNOSIS — G43719 Chronic migraine without aura, intractable, without status migrainosus: Secondary | ICD-10-CM | POA: Diagnosis not present

## 2019-05-28 DIAGNOSIS — G518 Other disorders of facial nerve: Secondary | ICD-10-CM | POA: Diagnosis not present

## 2019-05-28 DIAGNOSIS — M542 Cervicalgia: Secondary | ICD-10-CM | POA: Diagnosis not present

## 2019-05-28 DIAGNOSIS — G43111 Migraine with aura, intractable, with status migrainosus: Secondary | ICD-10-CM | POA: Diagnosis not present

## 2019-05-30 ENCOUNTER — Encounter: Payer: Self-pay | Admitting: Internal Medicine

## 2019-06-02 ENCOUNTER — Ambulatory Visit: Payer: Medicare PPO | Attending: Internal Medicine

## 2019-06-02 DIAGNOSIS — Z23 Encounter for immunization: Secondary | ICD-10-CM | POA: Insufficient documentation

## 2019-06-02 NOTE — Progress Notes (Signed)
   Covid-19 Vaccination Clinic  Name:  Jillian Rodgers    MRN: [id number] DOB: April 30, 1945  06/02/2019  [name] was observed post Covid-19 immunization for 15 minutes without incidence. She was provided with Vaccine Information Sheet and instruction to access the V-Safe system.   [name] was instructed to call 911 with any severe reactions post vaccine: [name] Kitchen Difficulty breathing  . Swelling of your face and throat  . A fast heartbeat  . A bad rash all over your body  . Dizziness and weakness    Immunizations Administered    Name Date Dose VIS Date Route   Pfizer COVID-19 Vaccine 06/02/2019  9:08 AM 0.3 mL 03/28/2019 Intramuscular   Manufacturer: [place]   Lot: [id number]   [place]: [id number]

## 2019-06-25 DIAGNOSIS — C44319 Basal cell carcinoma of skin of other parts of face: Secondary | ICD-10-CM | POA: Diagnosis not present

## 2019-06-25 DIAGNOSIS — D485 Neoplasm of uncertain behavior of skin: Secondary | ICD-10-CM | POA: Diagnosis not present

## 2019-06-25 DIAGNOSIS — Z419 Encounter for procedure for purposes other than remedying health state, unspecified: Secondary | ICD-10-CM | POA: Diagnosis not present

## 2019-06-25 DIAGNOSIS — L821 Other seborrheic keratosis: Secondary | ICD-10-CM | POA: Diagnosis not present

## 2019-06-30 ENCOUNTER — Encounter: Payer: Self-pay | Admitting: Internal Medicine

## 2019-06-30 NOTE — Patient Instructions (Signed)

## 2019-06-30 NOTE — Progress Notes (Signed)
Annual Screening/Preventative Visit & Comprehensive Evaluation &  Examination     This very nice 74 y.o. MWF  presents for a Screening /Preventative Visit & comprehensive evaluation and management of multiple medical co-morbidities.  Patient has been followed for HTN, HLD, Prediabetes, Hypothyroidism  and Vitamin D Deficiency. Patient has GERD controlled on Pantoprazole.      Labile HTN predates since the 1990's. 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 Patient's BP has been controlled at home and patient denies any cardiac symptoms as chest pain, palpitations, shortness of breath, dizziness or ankle swelling. Today's BP is at goal - 120/72.      Patient's hyperlipidemia is controlled with diet and Pravastatin. Patient denies myalgias or other medication SE's. Last lipids were at goal except elevated Trig's:  Lab Results  Component Value Date   CHOL 163 02/19/2019   HDL 54 02/19/2019   LDLCALC 79 02/19/2019   TRIG 209 (H) 02/19/2019   CHOLHDL 3.0 02/19/2019       Patient has hx/o prediabetes (A1c 6.0% / 2012).  Patient denies reactive hypoglycemic symptoms, visual blurring, diabetic polys or paresthesias. Last A1c was Normal & at goal:  Lab Results  Component Value Date   HGBA1C 5.6 02/19/2019      Patient was dx'd Hypothyroid in the 1990's & has been on thyroid replacement since.            Finally, patient has history of Vitamin D Deficiency ("39"/2012)  and last Vitamin D was at goal:  Lab Results  Component Value Date   VD25OH 91 11/18/2018    Current Outpatient Medications on File Prior to Visit  Medication Sig  . Ascorbic Acid (VITAMIN C) 500 MG CAPS Take 1 capsule by mouth daily.  [name] Kitchen aspirin 81 MG tablet Take 81 mg by mouth daily.  [name] Kitchen BOTOX 100 units SOLR injection Every 6 to 8 weeks for headaches  . Calcium Carbonate-Vit D-Min (CALCIUM 1200 PO) Take 1,200 Units by mouth daily.  . cetirizine (ZYRTEC) 10 MG tablet Take 10 mg by mouth  daily.  . Cholecalciferol (VITAMIN D PO) Take 5,000 Units by mouth daily.  [name] Kitchen gabapentin (NEURONTIN) 600 MG tablet Take 600 mg by mouth 4 (four) times daily.   . hyoscyamine (LEVSIN SL) 0.125 MG SL tablet DISSOLVE 1 TO 2 TABLETS UNDER THE TONGUE EVERY 4 HOURS AS NEEDED FOR NAUSEA OR CRAMPING OR DIARRHEA  . levothyroxine (SYNTHROID) 50 MCG tablet TAKE 1 TABLET BY MOUTH EVERY MORNING ON AN EMPTY STOMACH  . pantoprazole (PROTONIX) 40 MG tablet Take 1 tablet Daily for Acid Indigestion & Heartburn  . pravastatin (PRAVACHOL) 40 MG tablet Take 1 tablet at Bedtime for Cholesterol   No current facility-administered medications on file prior to visit.   No Known Allergies   Past Medical History:  Diagnosis Date  . Degenerative joint disease   . Diabetes mellitus without complication ([place])   . GERD (gastroesophageal reflux disease)   . Hyperlipidemia   . Hypertension   . Migraines    neurontin helps  . Pre-diabetes   . Thyroid disease    Health Maintenance  Topic Date Due  . Hepatitis C Screening  08/07/2019 (Originally 04-21-1945)  . Fecal DNA (Cologuard)  01/09/2021  . TETANUS/TDAP  11/11/2024  . INFLUENZA VACCINE  Completed  . DEXA SCAN  Completed  . PNA vac Low Risk Adult  Completed   Immunization History  Administered Date(s) Administered  . Influenza Inj Mdck Quad With  Preservative 01/15/2018  . Influenza, High Dose Seasonal PF 12/29/2013, 01/15/2015, 02/01/2016, 02/28/2017, 02/07/2018, 12/30/2018  . Influenza-Unspecified 01/24/2011, 02/02/2012, 01/16/2015  . PFIZER SARS-COV-2 Vaccination 05/12/2019, 06/02/2019  . Pneumococcal Conjugate-13 10/28/2014  . Pneumococcal-Unspecified 11/28/2011  . Td 11/17/2010  . Tdap 11/12/2014  . Zoster 11/17/2010    Cologard - 09.25.2019 - Negative - due 3 year f/u Oct 2022 EGD - 12.03.2019 - Dr Laurier Nancy - Chronic Gastritis Last MGM -   Past Surgical History:  Procedure Laterality Date  . CHOLECYSTECTOMY  1996   open  . COLONOSCOPY   2009   recommended 10 year f/u  . HERNIA REPAIR  1985   umb hernia rpr  . HERNIA REPAIR  2005 & 2009   ventral hernia rpr  . PILONIDAL CYST EXCISION  1972  . TONSILLECTOMY    . UPPER GI ENDOSCOPY    . VENTRAL HERNIA REPAIR     Family History  Problem Relation Age of Onset  . Ovarian cancer Mother   . Heart disease Mother   . Cancer Mother   . Uterine cancer Mother   . Hypertension Mother   . Breast cancer Sister   . Stroke Paternal Uncle   . Congestive Heart Failure Father   . Gout Maternal Aunt   . Colon cancer Neg Hx   . Esophageal cancer Neg Hx   . Stomach cancer Neg Hx   . Rectal cancer Neg Hx    Social History   Tobacco Use  . Smoking status: Never Smoker  . Smokeless tobacco: Never Used  Substance Use Topics  . Alcohol use: No  . Drug use: No    ROS Constitutional: Denies fever, chills, weight loss/gain, headaches, insomnia,  night sweats, and change in appetite. Does c/o fatigue. Eyes: Denies redness, blurred vision, diplopia, discharge, itchy, watery eyes.  ENT: Denies discharge, congestion, post nasal drip, epistaxis, sore throat, earache, hearing loss, dental pain, Tinnitus, Vertigo, Sinus pain, snoring.  Cardio: Denies chest pain, palpitations, irregular heartbeat, syncope, dyspnea, diaphoresis, orthopnea, PND, claudication, edema Respiratory: denies cough, dyspnea, DOE, pleurisy, hoarseness, laryngitis, wheezing.  Gastrointestinal: Denies dysphagia, heartburn, reflux, water brash, pain, cramps, nausea, vomiting, bloating, diarrhea, constipation, hematemesis, melena, hematochezia, jaundice, hemorrhoids Genitourinary: Denies dysuria, frequency, urgency, nocturia, hesitancy, discharge, hematuria, flank pain Breast: Breast lumps, nipple discharge, bleeding.  Musculoskeletal: Denies arthralgia, myalgia, stiffness, Jt. Swelling, pain, limp, and strain/sprain. Denies falls. Skin: Denies puritis, rash, hives, warts, acne, eczema, changing in skin lesion Neuro: No  weakness, tremor, incoordination, spasms, paresthesia, pain Psychiatric: Denies confusion, memory loss, sensory loss. Denies Depression. Endocrine: Denies change in weight, skin, hair change, nocturia, and paresthesia, diabetic polys, visual blurring, hyper / hypo glycemic episodes.  Heme/Lymph: No excessive bleeding, bruising, enlarged lymph nodes.  Physical Exam  BP 120/72   Pulse 64   Temp (!) 97.3 F (36.3 C)   Resp 16   Ht 5' 3.5" (1.613 m)   Wt 157 lb 12.8 oz (71.6 kg)   BMI 27.51 kg/m   General Appearance: WOver nourished, well groomed and in no apparent distress.  Eyes: PERRLA, EOMs, conjunctiva no swelling or erythema, normal fundi and vessels. Sinuses: No frontal/maxillary tenderness ENT/Mouth: EACs patent / TMs  nl. Nares clear without erythema, swelling, mucoid exudates. Oral hygiene is good. No erythema, swelling, or exudate. Tongue normal, non-obstructing. Tonsils not swollen or erythematous. Hearing normal.  Neck: Supple, thyroid not palpable. No bruits, nodes or JVD. Respiratory: Respiratory effort normal.  BS equal and clear bilateral without rales, rhonci, wheezing or  stridor. Cardio: Heart sounds are normal with regular rate and rhythm and no murmurs, rubs or gallops. Peripheral pulses are normal and equal bilaterally without edema. No aortic or femoral bruits. Chest: symmetric with normal excursions and percussion. Breasts: Symmetric, without lumps, nipple discharge, retractions, or fibrocystic changes.  Abdomen: Flat, soft with bowel sounds active. Nontender, no guarding, rebound, hernias, masses, or organomegaly.  Lymphatics: Non tender without lymphadenopathy.  Musculoskeletal: Full ROM all peripheral extremities, joint stability, 5/5 strength, and normal gait. Skin: Warm and dry without rashes, lesions, cyanosis, clubbing or  ecchymosis.  Neuro: Cranial nerves intact, reflexes equal bilaterally. Normal muscle tone, no cerebellar symptoms. Sensation intact.   Pysch: Alert and oriented X 3, normal affect, Insight and Judgment appropriate.   Assessment and Plan  1. Annual Preventative Screening Examination  2. Essential hypertension  - EKG 12-Lead - Urinalysis, Routine w reflex microscopic - Microalbumin / creatinine urine ratio - CBC with Differential/Platelet - COMPLETE METABOLIC PANEL WITH GFR - Magnesium - TSH  3. Hyperlipidemia, mixed  - EKG 12-Lead - Lipid panel - TSH  4. Abnormal glucose  - EKG 12-Lead - Hemoglobin A1c - Insulin, random  5. Vitamin D deficiency  - VITAMIN D 25 Hydroxy  6. Hypothyroidism  - TSH  7. Stage 3b chronic kidney disease  - Urinalysis, Routine w reflex microscopic - Microalbumin / creatinine urine ratio - COMPLETE METABOLIC PANEL WITH GFR  8. Screening for colorectal cancer  - POC Hemoccult Bld/Stl   9. Screening for ischemic heart disease  - EKG 12-Lead  10. FHx: heart disease   11. Medication management  - Urinalysis, Routine w reflex microscopic - Microalbumin / creatinine urine ratio - CBC with Differential/Platelet - COMPLETE METABOLIC PANEL WITH GFR - Magnesium - Lipid panel - TSH - Hemoglobin A1c - Insulin, random - VITAMIN D 25 Hydroxy        Patient was counseled in prudent diet to achieve/maintain BMI less than 25 for weight control, BP monitoring, regular exercise and medications. Discussed med's effects and SE's. Screening labs and tests as requested with regular follow-up as recommended. Over 40 minutes of exam, counseling, chart review and high complex critical decision making was performed.   Kirtland Bouchard, MD

## 2019-07-01 ENCOUNTER — Ambulatory Visit: Payer: Medicare PPO | Admitting: Internal Medicine

## 2019-07-01 ENCOUNTER — Other Ambulatory Visit: Payer: Self-pay

## 2019-07-01 VITALS — BP 120/72 | HR 64 | Temp 97.3°F | Resp 16 | Ht 63.5 in | Wt 157.8 lb

## 2019-07-01 DIAGNOSIS — Z136 Encounter for screening for cardiovascular disorders: Secondary | ICD-10-CM | POA: Diagnosis not present

## 2019-07-01 DIAGNOSIS — Z0001 Encounter for general adult medical examination with abnormal findings: Secondary | ICD-10-CM

## 2019-07-01 DIAGNOSIS — Z1211 Encounter for screening for malignant neoplasm of colon: Secondary | ICD-10-CM

## 2019-07-01 DIAGNOSIS — E559 Vitamin D deficiency, unspecified: Secondary | ICD-10-CM | POA: Diagnosis not present

## 2019-07-01 DIAGNOSIS — Z0000 Encounter for general adult medical examination without abnormal findings: Secondary | ICD-10-CM

## 2019-07-01 DIAGNOSIS — R7309 Other abnormal glucose: Secondary | ICD-10-CM | POA: Diagnosis not present

## 2019-07-01 DIAGNOSIS — Z79899 Other long term (current) drug therapy: Secondary | ICD-10-CM | POA: Diagnosis not present

## 2019-07-01 DIAGNOSIS — Z8249 Family history of ischemic heart disease and other diseases of the circulatory system: Secondary | ICD-10-CM | POA: Diagnosis not present

## 2019-07-01 DIAGNOSIS — E039 Hypothyroidism, unspecified: Secondary | ICD-10-CM | POA: Diagnosis not present

## 2019-07-01 DIAGNOSIS — E782 Mixed hyperlipidemia: Secondary | ICD-10-CM

## 2019-07-01 DIAGNOSIS — N1832 Chronic kidney disease, stage 3b: Secondary | ICD-10-CM

## 2019-07-01 DIAGNOSIS — I10 Essential (primary) hypertension: Secondary | ICD-10-CM

## 2019-07-02 LAB — CBC WITH DIFFERENTIAL/PLATELET
Absolute Monocytes: 587 cells/uL (ref 200–950)
Basophils Absolute: 40 cells/uL (ref 0–200)
Basophils Relative: 0.6 %
Eosinophils Absolute: 191 cells/uL (ref 15–500)
Eosinophils Relative: 2.9 %
HCT: 37.8 % (ref 35.0–45.0)
Hemoglobin: 12.6 g/dL (ref 11.7–15.5)
Lymphs Abs: 2178 cells/uL (ref 850–3900)
MCH: 30.1 pg (ref 27.0–33.0)
MCHC: 33.3 g/dL (ref 32.0–36.0)
MCV: 90.4 fL (ref 80.0–100.0)
MPV: 11.8 fL (ref 7.5–12.5)
Monocytes Relative: 8.9 %
Neutro Abs: 3604 cells/uL (ref 1500–7800)
Neutrophils Relative %: 54.6 %
Platelets: 70 10*3/uL — ABNORMAL LOW (ref 140–400)
RBC: 4.18 10*6/uL (ref 3.80–5.10)
RDW: 11.8 % (ref 11.0–15.0)
Total Lymphocyte: 33 %
WBC: 6.6 10*3/uL (ref 3.8–10.8)

## 2019-07-02 LAB — LIPID PANEL
Cholesterol: 168 mg/dL (ref <200)
HDL: 64 mg/dL (ref >=50)
LDL Cholesterol (Calc): 70 mg/dL (calc)
Non-HDL Cholesterol (Calc): 104 mg/dL (calc) (ref <130)
Total CHOL/HDL Ratio: 2.6 (calc) (ref <5.0)
Triglycerides: 242 mg/dL — ABNORMAL HIGH (ref <150)

## 2019-07-02 LAB — URINALYSIS, ROUTINE W REFLEX MICROSCOPIC
Bacteria, UA: NONE SEEN /HPF
Bilirubin Urine: NEGATIVE
Glucose, UA: NEGATIVE
Hgb urine dipstick: NEGATIVE
Hyaline Cast: NONE SEEN /LPF
Nitrite: NEGATIVE
Protein, ur: NEGATIVE
Specific Gravity, Urine: 1.025 (ref 1.001–1.03)
pH: <=5 (ref 5.0–8.0)

## 2019-07-02 LAB — COMPLETE METABOLIC PANEL WITH GFR
AG Ratio: 1.9 (calc) (ref 1.0–2.5)
ALT: 17 U/L (ref 6–29)
AST: 19 U/L (ref 10–35)
Albumin: 4.2 g/dL (ref 3.6–5.1)
Alkaline phosphatase (APISO): 87 U/L (ref 37–153)
BUN/Creatinine Ratio: 27 (calc) — ABNORMAL HIGH (ref 6–22)
BUN: 29 mg/dL — ABNORMAL HIGH (ref 7–25)
CO2: 26 mmol/L (ref 20–32)
Calcium: 9.6 mg/dL (ref 8.6–10.4)
Chloride: 107 mmol/L (ref 98–110)
Creat: 1.08 mg/dL — ABNORMAL HIGH (ref 0.60–0.93)
GFR, Est African American: 58 mL/min/{1.73_m2} — ABNORMAL LOW (ref >=60)
GFR, Est Non African American: 50 mL/min/{1.73_m2} — ABNORMAL LOW (ref >=60)
Globulin: 2.2 g/dL (calc) (ref 1.9–3.7)
Glucose, Bld: 87 mg/dL (ref 65–99)
Potassium: 4.6 mmol/L (ref 3.5–5.3)
Sodium: 141 mmol/L (ref 135–146)
Total Bilirubin: 0.5 mg/dL (ref 0.2–1.2)
Total Protein: 6.4 g/dL (ref 6.1–8.1)

## 2019-07-02 LAB — MICROALBUMIN / CREATININE URINE RATIO
Creatinine, Urine: 109 mg/dL (ref 20–275)
Microalb Creat Ratio: 9 mcg/mg creat (ref <30)
Microalb, Ur: 1 mg/dL

## 2019-07-02 LAB — HEMOGLOBIN A1C
Hgb A1c MFr Bld: 5.6 % of total Hgb (ref <5.7)
Mean Plasma Glucose: 114 (calc)
eAG (mmol/L): 6.3 (calc)

## 2019-07-02 LAB — INSULIN, RANDOM: Insulin: 30.3 u[IU]/mL — ABNORMAL HIGH

## 2019-07-02 LAB — MAGNESIUM: Magnesium: 1.9 mg/dL (ref 1.5–2.5)

## 2019-07-02 LAB — TSH: TSH: 1.74 mIU/L (ref 0.40–4.50)

## 2019-07-02 LAB — VITAMIN D 25 HYDROXY (VIT D DEFICIENCY, FRACTURES): Vit D, 25-Hydroxy: 70 ng/mL (ref 30–100)

## 2019-07-10 ENCOUNTER — Telehealth: Payer: Self-pay | Admitting: *Deleted

## 2019-07-10 NOTE — Telephone Encounter (Signed)
Pt aware of lab results 

## 2019-07-13 ENCOUNTER — Other Ambulatory Visit: Payer: Self-pay | Admitting: Internal Medicine

## 2019-07-13 DIAGNOSIS — E039 Hypothyroidism, unspecified: Secondary | ICD-10-CM

## 2019-07-13 MED ORDER — LEVOTHYROXINE SODIUM 50 MCG PO TABS: ORAL_TABLET | ORAL | 0 refills | Status: DC

## 2019-07-14 DIAGNOSIS — G43111 Migraine with aura, intractable, with status migrainosus: Secondary | ICD-10-CM | POA: Diagnosis not present

## 2019-07-14 DIAGNOSIS — M791 Myalgia, unspecified site: Secondary | ICD-10-CM | POA: Diagnosis not present

## 2019-07-14 DIAGNOSIS — G43719 Chronic migraine without aura, intractable, without status migrainosus: Secondary | ICD-10-CM | POA: Diagnosis not present

## 2019-07-14 DIAGNOSIS — M542 Cervicalgia: Secondary | ICD-10-CM | POA: Diagnosis not present

## 2019-07-14 DIAGNOSIS — G518 Other disorders of facial nerve: Secondary | ICD-10-CM | POA: Diagnosis not present

## 2019-07-14 DIAGNOSIS — G43019 Migraine without aura, intractable, without status migrainosus: Secondary | ICD-10-CM | POA: Diagnosis not present

## 2019-07-24 DIAGNOSIS — C44319 Basal cell carcinoma of skin of other parts of face: Secondary | ICD-10-CM | POA: Diagnosis not present

## 2019-07-24 DIAGNOSIS — Z85828 Personal history of other malignant neoplasm of skin: Secondary | ICD-10-CM | POA: Diagnosis not present

## 2019-08-05 ENCOUNTER — Other Ambulatory Visit: Payer: Self-pay | Admitting: Nephrology

## 2019-08-05 DIAGNOSIS — N281 Cyst of kidney, acquired: Secondary | ICD-10-CM

## 2019-08-28 ENCOUNTER — Ambulatory Visit: Payer: Medicare Other | Admitting: Adult Health

## 2019-08-28 DIAGNOSIS — G43719 Chronic migraine without aura, intractable, without status migrainosus: Secondary | ICD-10-CM | POA: Diagnosis not present

## 2019-08-28 DIAGNOSIS — G43111 Migraine with aura, intractable, with status migrainosus: Secondary | ICD-10-CM | POA: Diagnosis not present

## 2019-08-28 DIAGNOSIS — M791 Myalgia, unspecified site: Secondary | ICD-10-CM | POA: Diagnosis not present

## 2019-08-28 DIAGNOSIS — M542 Cervicalgia: Secondary | ICD-10-CM | POA: Diagnosis not present

## 2019-08-28 DIAGNOSIS — G518 Other disorders of facial nerve: Secondary | ICD-10-CM | POA: Diagnosis not present

## 2019-08-28 DIAGNOSIS — G43019 Migraine without aura, intractable, without status migrainosus: Secondary | ICD-10-CM | POA: Diagnosis not present

## 2019-09-01 ENCOUNTER — Other Ambulatory Visit: Payer: Medicare PPO

## 2019-09-04 ENCOUNTER — Other Ambulatory Visit: Payer: Self-pay

## 2019-09-04 ENCOUNTER — Ambulatory Visit
Admission: RE | Admit: 2019-09-04 | Discharge: 2019-09-04 | Disposition: A | Discharge status: Home / Self Care | Payer: Medicare PPO | Source: Ambulatory Visit | Attending: Nephrology | Admitting: Nephrology

## 2019-09-04 DIAGNOSIS — N281 Cyst of kidney, acquired: Secondary | ICD-10-CM

## 2019-09-04 DIAGNOSIS — N2889 Other specified disorders of kidney and ureter: Secondary | ICD-10-CM | POA: Diagnosis not present

## 2019-09-04 MED ORDER — GADOBENATE DIMEGLUMINE 529 MG/ML IV SOLN
14.0000 mL | Freq: Once | INTRAVENOUS | Status: AC | PRN
  Administered 2019-09-04: 14 mL via INTRAVENOUS

## 2019-10-01 ENCOUNTER — Other Ambulatory Visit: Payer: Medicare PPO

## 2019-10-02 DIAGNOSIS — M2041 Other hammer toe(s) (acquired), right foot: Secondary | ICD-10-CM | POA: Diagnosis not present

## 2019-10-02 DIAGNOSIS — I739 Peripheral vascular disease, unspecified: Secondary | ICD-10-CM | POA: Diagnosis not present

## 2019-10-02 DIAGNOSIS — B351 Tinea unguium: Secondary | ICD-10-CM | POA: Diagnosis not present

## 2019-10-06 DIAGNOSIS — G43019 Migraine without aura, intractable, without status migrainosus: Secondary | ICD-10-CM | POA: Diagnosis not present

## 2019-10-06 DIAGNOSIS — M791 Myalgia, unspecified site: Secondary | ICD-10-CM | POA: Diagnosis not present

## 2019-10-06 DIAGNOSIS — G43111 Migraine with aura, intractable, with status migrainosus: Secondary | ICD-10-CM | POA: Diagnosis not present

## 2019-10-06 DIAGNOSIS — G518 Other disorders of facial nerve: Secondary | ICD-10-CM | POA: Diagnosis not present

## 2019-10-06 DIAGNOSIS — G43719 Chronic migraine without aura, intractable, without status migrainosus: Secondary | ICD-10-CM | POA: Diagnosis not present

## 2019-10-06 DIAGNOSIS — M542 Cervicalgia: Secondary | ICD-10-CM | POA: Diagnosis not present

## 2019-10-07 NOTE — Progress Notes (Signed)
MEDICARE ANNUAL WELLNESS VISIT AND FOLLOW UP  Assessment:   Diagnoses and all orders for this visit:  Encounter for Medicare annual wellness exam Due annually  Hep C screening completed  Essential hypertension At goal; continue medication Monitor blood pressure at home; call if consistently over 130/80 Continue DASH diet.   Reminder to go to the ER if any CP, SOB, nausea, dizziness, severe HA, changes vision/speech, left arm numbness and tingling and jaw pain. -     Magnesium  Gastroesophageal reflux disease, esophagitis presence not specified Well managed on current medications; has failed PPI taper in the past; gastritis per last EGD, continue PPI Discussed diet, avoiding triggers and other lifestyle changes -     Magnesium  Hypothyroidism, unspecified type continue medications the same pending lab results  reminded to take on an empty stomach 30-54mins before food.  check TSH level -     TSH  Osteoarthritis, unspecified osteoarthritis type, unspecified site Intermittent; managed by OTC analgesics; established with orthopedics  CKD Stage 3 Increase fluids, avoid NSAIDS, monitor sugars, will monitor -     CMP/GFR  Vitamin D deficiency At goal at recent check; continue to recommend supplementation for goal of 70-100 Defer vitamin D level  Upper airway cough syndrome Continue with acid reflux medications  Thrombocytopenia (HCC) Stable for many years; monitor -  -     CBC with Differential/Platelet  Other abnormal glucose  Recent A1Cs at goal Discussed diet/exercise, weight management  Defer A1C; check CMP  Overweight (BMI 25.0-29.9) Long discussion about weight loss, diet, and exercise Recommended diet heavy in fruits and veggies and low in animal meats, cheeses, and dairy products, appropriate calorie intake Discussed appropriate weight for height  Follow up at next visit  Medication management -     CBC with Differential/Platelet -     CMP/GFR    Mixed hyperlipidemia Continue medications Continue low cholesterol diet and exercise.  Check lipid panel.  -     Lipid panel  Abnormal mammogram R breast cysts stable on last mammogram, recommended 12 month follow up diagnostic for stability Breast center is following  Inguinal hernia  Will schedule surgery with Dr. Kae Heller Avoid heavy lifting If severe pain, non-reducible hernia advised surgical emergency, go to ED   Over 40 minutes of exam, counseling, chart review and critical decision making was performed Future Appointments  Date Time Provider [place]  01/12/2020  2:30 PM Unk Pinto, MD [facility] None  07/12/2020  3:00 PM Unk Pinto, MD [facility] None     Plan:   During the course of the visit the patient was educated and counseled about appropriate screening and preventive services including:    Pneumococcal vaccine   Prevnar 13  Influenza vaccine  Td vaccine  Screening electrocardiogram  Bone densitometry screening  Colorectal cancer screening  Diabetes screening  Glaucoma screening  Nutrition counseling   Advanced directives: requested   Subjective:  Jillian Rodgers is a 74 y.o. female who presents for Medicare Annual Wellness Visit and 3 month follow up.   Patient has GERD, failed trial of transition of Protonix to Ranitidine alone and currently is controlled on Protonix/am. She underwent EGD 03/2018 which showed gastritis without evidence of dysplasia, malignancy or h. Pylori.  L inguinal hernia - has discussed with gen surgery Dr. Kae Heller, plans to schedule surgery later this year, reports has had intermittent discomfort.   She has R hammer toe surgery planned Sept 9th 2021 with Dr. Geroge Baseman.   She  is followed by neurology for headaches/migraines and getting botox shots, much improved.   BMI is Body mass index is 27.34 kg/m., she has not been working on diet and exercise. Wt Readings from Last 3 Encounters:   10/08/19 156 lb 12.8 oz (71.1 kg)  07/01/19 157 lb 12.8 oz (71.6 kg)  02/19/19 155 lb 6.4 oz (70.5 kg)   Was having frequent near syncopal episodes, reports resolved spontaneously and none this last year  Her blood pressure has been controlled at home, today their BP is BP: 110/72 She does not workout. She denies chest pain, shortness of breath, dizziness.   She is on cholesterol medication (pravastatin 40 mg daily) and denies myalgias. Her cholesterol is at goal. The cholesterol last visit was:   Lab Results  Component Value Date   CHOL 168 07/01/2019   HDL 64 07/01/2019   LDLCALC 70 07/01/2019   TRIG 242 (H) 07/01/2019   CHOLHDL 2.6 07/01/2019    She has not been working on diet and exercise for glucose management, and denies foot ulcerations, increased appetite, nausea, paresthesia of the feet, polydipsia, polyuria, visual disturbances, vomiting and weight loss. Last A1C in the office was:  Lab Results  Component Value Date   HGBA1C 5.6 07/01/2019   She is on thyroid medication. Her medication was not changed last visit.  50 mcg daily Lab Results  Component Value Date   TSH 1.74 07/01/2019   Last GFR: Lab Results  Component Value Date   GFRNONAA 50 (L) 07/01/2019  She is followed by Dr. Corliss Parish. Follows for renal cysts, stable on recent MRI.   Patient is on Vitamin D supplement and at goal at recent check:    Lab Results  Component Value Date   VD25OH 70 07/01/2019      She has persistent stable thrombocytopenia for many years CBC Latest Ref Rng & Units 07/01/2019 02/19/2019 11/18/2018  WBC 3.8 - 10.8 Thousand/uL 6.6 7.8 7.1  Hemoglobin 11.7 - 15.5 g/dL 12.6 12.4 12.2  Hematocrit 35 - 45 % 37.8 36.3 36.8  Platelets 140 - 400 Thousand/uL 70(L) 95(L) 84(L)      Medication Review: Current Outpatient Medications on File Prior to Visit  Medication Sig Dispense Refill  . Ascorbic Acid (VITAMIN C) 500 MG CAPS Take 1 capsule by mouth daily.    [name] Kitchen aspirin 81 MG  tablet Take 81 mg by mouth daily.    [name] Kitchen BOTOX 100 units SOLR injection Every 6 to 8 weeks for headaches    . Calcium Carbonate-Vit D-Min (CALCIUM 1200 PO) Take 1,200 Units by mouth daily.    . cetirizine (ZYRTEC) 10 MG tablet Take 10 mg by mouth daily.    . Cholecalciferol (VITAMIN D PO) Take 5,000 Units by mouth daily.    [name] Kitchen gabapentin (NEURONTIN) 600 MG tablet Take 600 mg by mouth 4 (four) times daily.     [name] Kitchen levothyroxine (SYNTHROID) 50 MCG tablet Take 1 tablet daily on an empty stomach with only water for 30 minutes & no Antacid meds, Calcium or Magnesium for 4 hours & avoid Biotin 90 tablet 0  . pantoprazole (PROTONIX) 40 MG tablet Take 1 tablet Daily for Acid Indigestion & Heartburn 90 tablet 3  . pravastatin (PRAVACHOL) 40 MG tablet Take 1 tablet at Bedtime for Cholesterol 90 tablet 3   No current facility-administered medications on file prior to visit.    No Known Allergies  Current Problems (verified) Patient Active Problem List   Diagnosis Date Noted  . Migraine  without status migrainosus, not intractable 10/08/2019  . Inguinal hernia 02/19/2019  . Mammogram abnormal 08/07/2018  . Thrombocytopenia ([place]) 11/25/2015  . Encounter for Medicare annual wellness exam 06/14/2015  . Overweight (BMI 25.0-29.9) 01/28/2015  . CKD (chronic kidney disease) stage 3, GFR 30-59 ml/min (HCC) 01/19/2014  . Upper airway cough syndrome 12/16/2013  . Vitamin D deficiency 05/21/2013  . Other abnormal glucose (prediabetes) 05/21/2013  . Medication management 05/21/2013  . Essential hypertension 01/31/2013  . Degenerative joint disease   . GERD (gastroesophageal reflux disease)   . Hyperlipidemia   . Hypothyroidism     Screening Tests Immunization History  Administered Date(s) Administered  . Influenza Inj Mdck Quad With Preservative 01/15/2018  . Influenza, High Dose Seasonal PF 12/29/2013, 01/15/2015, 02/01/2016, 02/28/2017, 02/07/2018, 12/30/2018  . Influenza-Unspecified 01/24/2011,  02/02/2012, 01/16/2015  . PFIZER SARS-COV-2 Vaccination 05/12/2019, 06/02/2019  . Pneumococcal Conjugate-13 10/28/2014  . Pneumococcal-Unspecified 11/28/2011  . Td 11/17/2010  . Tdap 11/12/2014  . Zoster 11/17/2010   Preventative care: Last colonoscopy: 2009 Last cologuard: 12/2017 Last EGD: 03/2018  Last mammogram: 02/2019 - does at GYN - Dr. Rogue Bussing, has abnormal R cluster of cysts stable x 12 months, recommended 1 year diagnostic follow up for stability  DEXA:03/2016 L fem T -1.6, had in 2019 at GYN, pending receipt of report  Prior vaccinations: TD or Tdap: 2016  Influenza: 12/2018 Pneumococcal: 2013 Prevnar13: 2016 Shingles/Zostavax: 2012 Covid 19: 2/2, 2021, pfizer  Names of Other Physician/Practitioners you currently use: 1. [facility]- here for primary care 2. Dr. Gershon Crane, eye doctor, last visit 2020, has upcoming scheduled 3. Dr. Mathis Fare dentist, last visit 2021, goes q65m  Patient Care Team: Unk Pinto, [name], [name]  SURGICAL HISTORY She  has a past surgical history that includes Ventral hernia repair; Cholecystectomy (1996); Pilonidal cyst excision (1972); Tonsillectomy; Upper gi endoscopy; Colonoscopy (2009); Hernia repair (1985); and Hernia repair (2005 & 2009). FAMILY HISTORY Her family history includes Breast cancer in her sister; Cancer in her mother; Congestive Heart Failure in her father; Gout in her maternal aunt; Heart disease in her mother; Hypertension in her mother; Ovarian cancer in her mother; Stroke in her paternal uncle; Uterine cancer in her mother. SOCIAL HISTORY She  reports that she has never smoked. She has never used smokeless tobacco. She reports that she does not drink alcohol and does not use drugs.   MEDICARE WELLNESS OBJECTIVES: Physical activity: Current Exercise Habits: The patient does not participate  in regular exercise at present, Exercise limited by: None identified Cardiac risk factors: Cardiac Risk Factors include: advanced age (>61men, >27 women);dyslipidemia;hypertension;sedentary lifestyle Depression/mood screen:   Depression screen [facility] 2/9 10/08/2019  Decreased Interest 0  Down, Depressed, Hopeless 0  PHQ - 2 Score 0    ADLs:  In your present state of health, do you have any difficulty performing the following activities: 10/08/2019 06/30/2019  Hearing? N N  Vision? N N  Difficulty concentrating or making decisions? N N  Walking or climbing stairs? N N  Dressing or bathing? N N  Doing errands, shopping? N N  Some recent data might be hidden     Cognitive Testing  Alert? Yes  Normal Appearance?Yes  Oriented to person? Yes  Place? Yes   Time? Yes  Recall of three objects?  Yes  Can perform simple calculations? Yes  Displays appropriate judgment?Yes  Can read the correct time from a watch face?Yes  EOL planning: Does Patient Have a Medical Advance Directive?: No Would patient like information on creating a medical advance directive?: No - Patient declined     Review of Systems  Constitutional: Negative for malaise/fatigue and weight loss.  HENT: Negative for hearing loss and tinnitus.   Eyes: Negative for blurred vision and double vision.  Respiratory: Negative for cough, sputum production, shortness of breath and wheezing.   Cardiovascular: Negative for chest pain, palpitations, orthopnea, claudication, leg swelling and PND.  Gastrointestinal: Negative for abdominal pain, blood in stool, constipation, diarrhea, heartburn, melena, nausea and vomiting.  Genitourinary: Negative.   Musculoskeletal: Negative for falls, joint pain and myalgias.  Skin: Negative for rash.  Neurological: Positive for headaches (chronic, not new, getting botox injections). Negative for dizziness, tingling, sensory change, loss of consciousness and weakness.  Endo/Heme/Allergies: Negative for  polydipsia.  Psychiatric/Behavioral: Negative.  Negative for depression, memory loss, substance abuse and suicidal ideas. The patient is not nervous/anxious and does not have insomnia.   All other systems reviewed and are negative.    Objective:     Today's Vitals   10/08/19 0948  BP: 110/72  Pulse: 66  Temp: (!) 96.7 F (35.9 C)  SpO2: 97%  Weight: 156 lb 12.8 oz (71.1 kg)   Body mass index is 27.34 kg/m.  General appearance: alert, no distress, WD/WN,  female HEENT: normocephalic, sclerae anicteric, TMs pearly, nares patent, no discharge or erythema, pharynx normal Oral cavity: MMM, no lesions Neck: supple, no lymphadenopathy, no thyromegaly, no masses Heart: RRR, normal S1, S2, no murmurs Lungs: CTA bilaterally, no wheezes, rhonchi, or rales Abdomen: +bs, soft, LLQ/groin tenderness with easily reduced inguinal hernia, non distended, no masses, no hepatomegaly, no splenomegaly Musculoskeletal: nontender, no swelling, no obvious deformity Extremities: no edema, no cyanosis, no clubbing Pulses: 2+ symmetric, upper and lower extremities, normal cap refill Neurological: alert, oriented x 3, CN2-12 intact, strength normal upper extremities and lower extremities, sensation normal throughout, DTRs 2+ throughout, no cerebellar signs, gait normal Psychiatric: normal affect, behavior normal, pleasant    Medicare Attestation I have personally reviewed: The patient's medical and social history Their use of alcohol, tobacco or illicit drugs Their current medications and supplements The patient's functional ability including ADLs,fall risks, home safety risks, cognitive, and hearing and visual impairment Diet and physical activities Evidence for depression or mood disorders  The patient's weight, height, BMI, and visual acuity have been recorded in the chart.  I have made referrals, counseling, and provided education to the patient based on review of the above and I have provided the  patient with a written personalized care plan for preventive services.     Izora Ribas, NP   10/08/2019

## 2019-10-08 ENCOUNTER — Other Ambulatory Visit: Payer: Self-pay

## 2019-10-08 ENCOUNTER — Ambulatory Visit: Payer: Medicare PPO | Admitting: Adult Health

## 2019-10-08 ENCOUNTER — Ambulatory Visit: Payer: Medicare Other | Admitting: Adult Health

## 2019-10-08 ENCOUNTER — Encounter: Payer: Self-pay | Admitting: Adult Health

## 2019-10-08 VITALS — BP 110/72 | HR 66 | Temp 96.7°F | Wt 156.8 lb

## 2019-10-08 DIAGNOSIS — N183 Chronic kidney disease, stage 3 unspecified: Secondary | ICD-10-CM

## 2019-10-08 DIAGNOSIS — K219 Gastro-esophageal reflux disease without esophagitis: Secondary | ICD-10-CM | POA: Diagnosis not present

## 2019-10-08 DIAGNOSIS — I10 Essential (primary) hypertension: Secondary | ICD-10-CM

## 2019-10-08 DIAGNOSIS — R928 Other abnormal and inconclusive findings on diagnostic imaging of breast: Secondary | ICD-10-CM

## 2019-10-08 DIAGNOSIS — R6889 Other general symptoms and signs: Secondary | ICD-10-CM | POA: Diagnosis not present

## 2019-10-08 DIAGNOSIS — R05 Cough: Secondary | ICD-10-CM | POA: Diagnosis not present

## 2019-10-08 DIAGNOSIS — R058 Other specified cough: Secondary | ICD-10-CM

## 2019-10-08 DIAGNOSIS — R7309 Other abnormal glucose: Secondary | ICD-10-CM | POA: Diagnosis not present

## 2019-10-08 DIAGNOSIS — E782 Mixed hyperlipidemia: Secondary | ICD-10-CM | POA: Diagnosis not present

## 2019-10-08 DIAGNOSIS — K4090 Unilateral inguinal hernia, without obstruction or gangrene, not specified as recurrent: Secondary | ICD-10-CM

## 2019-10-08 DIAGNOSIS — D696 Thrombocytopenia, unspecified: Secondary | ICD-10-CM | POA: Diagnosis not present

## 2019-10-08 DIAGNOSIS — E039 Hypothyroidism, unspecified: Secondary | ICD-10-CM

## 2019-10-08 DIAGNOSIS — E663 Overweight: Secondary | ICD-10-CM | POA: Diagnosis not present

## 2019-10-08 DIAGNOSIS — Z0000 Encounter for general adult medical examination without abnormal findings: Secondary | ICD-10-CM

## 2019-10-08 DIAGNOSIS — E559 Vitamin D deficiency, unspecified: Secondary | ICD-10-CM | POA: Diagnosis not present

## 2019-10-08 DIAGNOSIS — K589 Irritable bowel syndrome without diarrhea: Secondary | ICD-10-CM

## 2019-10-08 DIAGNOSIS — Z79899 Other long term (current) drug therapy: Secondary | ICD-10-CM

## 2019-10-08 DIAGNOSIS — Z1159 Encounter for screening for other viral diseases: Secondary | ICD-10-CM

## 2019-10-08 DIAGNOSIS — Z0001 Encounter for general adult medical examination with abnormal findings: Secondary | ICD-10-CM | POA: Diagnosis not present

## 2019-10-08 DIAGNOSIS — M1990 Unspecified osteoarthritis, unspecified site: Secondary | ICD-10-CM

## 2019-10-08 DIAGNOSIS — G43909 Migraine, unspecified, not intractable, without status migrainosus: Secondary | ICD-10-CM

## 2019-10-08 MED ORDER — HYOSCYAMINE SULFATE 0.125 MG SL SUBL: SUBLINGUAL_TABLET | SUBLINGUAL | 3 refills | Status: DC

## 2019-10-08 NOTE — Patient Instructions (Addendum)
Jillian Rodgers , Thank you for taking time to come for your Medicare Wellness Visit. I appreciate your ongoing commitment to your health goals. Please review the following plan we discussed and let me know if I can assist you in the future.   These are the goals we discussed: Goals    . Exercise 150 min/wk Moderate Activity       This is a list of the screening recommended for you and due dates:  Health Maintenance  Topic Date Due  .  Hepatitis C: One time screening is recommended by Center for Disease Control  (CDC) for  adults born from [birth year] through 1967.   Never done  . Flu Shot  11/16/2019  . Cologuard (Stool DNA test)  01/09/2021  . Tetanus Vaccine  11/11/2024  . DEXA scan (bone density measurement)  Completed  . COVID-19 Vaccine  Completed  . Pneumonia vaccines  Completed    Recommend 20-30 min of walking daily, 10-20 chair sits daily   Consider physical therapy after hernia surgery for strength, endurance and balance   Sit-to-Stand Exercise  The sit-to-stand exercise (also known as the chair stand or chair rise exercise) strengthens your lower body and helps you maintain or improve your mobility and independence. The goal is to do the sit-to-stand exercise without using your hands. This will be easier as you become stronger. You should always talk with your health care provider before starting any exercise program, especially if you have had recent surgery. Do the exercise exactly as told by your health care provider and adjust it as directed. It is normal to feel mild stretching, pulling, tightness, or discomfort as you do this exercise, but you should stop right away if you feel sudden pain or your pain gets worse. Do not begin doing this exercise until told by your health care provider. What the sit-to-stand exercise does The sit-to-stand exercise helps to strengthen the muscles in your thighs and the muscles in the center of your body that give you stability (core muscles).  This exercise is especially helpful if:  You have had knee or hip surgery.  You have trouble getting up from a chair, out of a car, or off the toilet. How to do the sit-to-stand exercise 1. Sit toward the front edge of a sturdy chair without armrests. Your knees should be bent and your feet should be flat on the floor and shoulder-width apart. 2. Place your hands lightly on each side of the seat. Keep your back and neck as straight as possible, with your chest slightly forward. 3. Breathe in slowly. Lean forward and slightly shift your weight to the front of your feet. 4. Breathe out as you slowly stand up. Use your hands as little as possible. 5. Stand and pause for a full breath in and out. 6. Breathe in as you sit down slowly. Tighten your core and abdominal muscles to control your lowering as much as possible. 7. Breathe out slowly. 8. Do this exercise 10-15 times. If needed, do it fewer times until you build up strength. 9. Rest for 1 minute, then do another set of 10-15 repetitions. To change the difficulty of the sit-to-stand exercise  If the exercise is too difficult, use a chair with sturdy armrests, and push off the armrests to help you come to the standing position. You can also use the armrests to help slowly lower yourself back to sitting. As this gets easier, try to use your arms less. You can also place  a firm cushion or pillow on the chair to make the surface higher.  If this exercise is too easy, do not use your arms to help raise or lower yourself. You can also wear a weighted vest, use hand weights, increase your repetitions, or try a lower chair. General tips  You may feel tired when starting an exercise routine. This is normal.  You may have muscle soreness that lasts a few days. This is normal. As you get stronger, you may not feel muscle soreness.  Use smooth, steady movements.  Do not  hold your breath during strength exercises. This can cause unsafe changes in  your blood pressure.  Breathe in slowly through your nose, and breathe out slowly through your mouth. Summary  Strengthening your lower body is an important step to help you move safely and independently.  The sit-to-stand exercise helps strengthen the muscles in your thighs and core.  You should always talk with your health care provider before starting any exercise program, especially if you have had recent surgery. This information is not intended to replace advice given to you by your health care provider. Make sure you discuss any questions you have with your health care provider. Document Revised: 01/30/2018 Document Reviewed: 05/25/2016 Elsevier Patient Education  [employer].

## 2019-10-09 ENCOUNTER — Other Ambulatory Visit: Payer: Self-pay | Admitting: Adult Health

## 2019-10-09 DIAGNOSIS — N289 Disorder of kidney and ureter, unspecified: Secondary | ICD-10-CM

## 2019-10-09 LAB — CBC WITH DIFFERENTIAL/PLATELET
Absolute Monocytes: 710 cells/uL (ref 200–950)
Basophils Absolute: 59 cells/uL (ref 0–200)
Basophils Relative: 0.8 %
Eosinophils Absolute: 207 cells/uL (ref 15–500)
Eosinophils Relative: 2.8 %
HCT: 38.8 % (ref 35.0–45.0)
Hemoglobin: 12.5 g/dL (ref 11.7–15.5)
Lymphs Abs: 2457 cells/uL (ref 850–3900)
MCH: 30 pg (ref 27.0–33.0)
MCHC: 32.2 g/dL (ref 32.0–36.0)
MCV: 93.3 fL (ref 80.0–100.0)
MPV: 11.6 fL (ref 7.5–12.5)
Monocytes Relative: 9.6 %
Neutro Abs: 3966 cells/uL (ref 1500–7800)
Neutrophils Relative %: 53.6 %
Platelets: 84 10*3/uL — ABNORMAL LOW (ref 140–400)
RBC: 4.16 10*6/uL (ref 3.80–5.10)
RDW: 12.3 % (ref 11.0–15.0)
Total Lymphocyte: 33.2 %
WBC: 7.4 10*3/uL (ref 3.8–10.8)

## 2019-10-09 LAB — LIPID PANEL
Cholesterol: 154 mg/dL (ref <200)
HDL: 74 mg/dL (ref >=50)
LDL Cholesterol (Calc): 54 mg/dL (calc)
Non-HDL Cholesterol (Calc): 80 mg/dL (calc) (ref <130)
Total CHOL/HDL Ratio: 2.1 (calc) (ref <5.0)
Triglycerides: 181 mg/dL — ABNORMAL HIGH (ref <150)

## 2019-10-09 LAB — COMPLETE METABOLIC PANEL WITH GFR
AG Ratio: 1.8 (calc) (ref 1.0–2.5)
ALT: 13 U/L (ref 6–29)
AST: 15 U/L (ref 10–35)
Albumin: 4.2 g/dL (ref 3.6–5.1)
Alkaline phosphatase (APISO): 68 U/L (ref 37–153)
BUN/Creatinine Ratio: 24 (calc) — ABNORMAL HIGH (ref 6–22)
BUN: 33 mg/dL — ABNORMAL HIGH (ref 7–25)
CO2: 30 mmol/L (ref 20–32)
Calcium: 10.5 mg/dL — ABNORMAL HIGH (ref 8.6–10.4)
Chloride: 105 mmol/L (ref 98–110)
Creat: 1.38 mg/dL — ABNORMAL HIGH (ref 0.60–0.93)
GFR, Est African American: 43 mL/min/{1.73_m2} — ABNORMAL LOW (ref >=60)
GFR, Est Non African American: 37 mL/min/{1.73_m2} — ABNORMAL LOW (ref >=60)
Globulin: 2.3 g/dL (calc) (ref 1.9–3.7)
Glucose, Bld: 73 mg/dL (ref 65–99)
Potassium: 4.1 mmol/L (ref 3.5–5.3)
Sodium: 142 mmol/L (ref 135–146)
Total Bilirubin: 0.5 mg/dL (ref 0.2–1.2)
Total Protein: 6.5 g/dL (ref 6.1–8.1)

## 2019-10-09 LAB — TSH: TSH: 3.83 mIU/L (ref 0.40–4.50)

## 2019-10-09 LAB — HEPATITIS C ANTIBODY
Hepatitis C Ab: NONREACTIVE
SIGNAL TO CUT-OFF: 0.01 (ref <1.00)

## 2019-10-09 LAB — MAGNESIUM: Magnesium: 2.1 mg/dL (ref 1.5–2.5)

## 2019-10-31 ENCOUNTER — Ambulatory Visit: Payer: Self-pay | Admitting: Surgery

## 2019-10-31 DIAGNOSIS — K4090 Unilateral inguinal hernia, without obstruction or gangrene, not specified as recurrent: Secondary | ICD-10-CM | POA: Diagnosis not present

## 2019-10-31 NOTE — H&P (Signed)
Surgical Evaluation   HPI: returns and is ready to proceed with scheduling for a left inguinal hernia.  Reports no changes in the hernia since her last visit.  She has noticed that on some occasions it will be less swollen and painful after she urinates.  She is having surgery for hammertoe in September and is hoping to have the hernia repaired before that.    Intiial visit 02/27/19: This is a very pleasant 74 year old woman who presents with a left inguinal hernia. She has noticed a bulge in this area for some time which is occasionally painful.  It is always reducible.  She does note intermittent constipation.  No prior inguinal hernia surgery but she has had an open cholecystectomy as well as an umbilical hernia repair followed by laparoscopic recurrent ventral hernia repair. CT scan in 2018 demonstrates a left inguinal hernia containing bowel however interestingly she had an ultrasound this year which was negative. Medical history notable for hypertension, hyperlipidemia, deficiency, GERD, prediabetes, hypothyroid.  No Known Allergies  Past Medical History:  Diagnosis Date   Degenerative joint disease    Diabetes mellitus without complication (HCC)    GERD (gastroesophageal reflux disease)    Hyperlipidemia    Hypertension    Migraines    neurontin helps   Pre-diabetes    Thyroid disease     Past Surgical History:  Procedure Laterality Date   CHOLECYSTECTOMY  1996   open   COLONOSCOPY  2009   recommended 10 year f/u   [place]   umb hernia rpr   HERNIA REPAIR  2005 & 2009   ventral hernia rpr   PILONIDAL CYST EXCISION  1972   TONSILLECTOMY     UPPER GI ENDOSCOPY     VENTRAL HERNIA REPAIR      Family History  Problem Relation Age of Onset   Ovarian cancer Mother    Heart disease Mother    Cancer Mother    Uterine cancer Mother    Hypertension Mother    Breast cancer Sister    Stroke Paternal Uncle    Congestive Heart Failure Father    Gout Maternal  Aunt    Colon cancer Neg Hx    Esophageal cancer Neg Hx    Stomach cancer Neg Hx    Rectal cancer Neg Hx     Social History   Socioeconomic History   Marital status: Married    Spouse name: Not on file   Number of children: Not on file   Years of education: Not on file   Highest education level: Not on file  Occupational History   Not on file  Tobacco Use   Smoking status: Never Smoker   Smokeless tobacco: Never Used  Vaping Use   Vaping Use: Never used  Substance and Sexual Activity   Alcohol use: No   Drug use: No   Sexual activity: Not on file  Other Topics Concern   Not on file  Social History Narrative   Not on file   Social Determinants of Health   Financial Resource Strain:    Difficulty of Paying Living Expenses:   Food Insecurity:    Worried About Charity fundraiser in the Last Year:    Arboriculturist in the Last Year:   Transportation Needs:    Film/video editor (Medical):    Lack of Transportation (Non-Medical):   Physical Activity:    Days of Exercise per Week:    Minutes of  Exercise per Session:   Stress:    Feeling of Stress :   Social Connections:    Frequency of Communication with Friends and Family:    Frequency of Social Gatherings with Friends and Family:    Attends Religious Services:    Active Member of Clubs or Organizations:    Attends Music therapist:    Marital Status:     Current Outpatient Medications on File Prior to Visit  Medication Sig Dispense Refill   Ascorbic Acid (VITAMIN C) 500 MG CAPS Take 1 capsule by mouth daily.     aspirin 81 MG tablet Take 81 mg by mouth daily.     BOTOX 100 units SOLR injection Every 6 to 8 weeks for headaches     Calcium Carbonate-Vit D-Min (CALCIUM 1200 PO) Take 1,200 Units by mouth daily.     cetirizine (ZYRTEC) 10 MG tablet Take 10 mg by mouth daily.     Cholecalciferol (VITAMIN D PO) Take 5,000 Units by mouth daily.     gabapentin (NEURONTIN) 600 MG tablet Take 600 mg  by mouth 4 (four) times daily.      hyoscyamine (LEVSIN SL) 0.125 MG SL tablet DISSOLVE 1 TO 2 TABLETS UNDER THE TONGUE EVERY 4 HOURS AS NEEDED FOR NAUSEA OR CRAMPING OR DIARRHEA 30 tablet 3   levothyroxine (SYNTHROID) 50 MCG tablet Take 1 tablet daily on an empty stomach with only water for 30 minutes & no Antacid meds, Calcium or Magnesium for 4 hours & avoid Biotin 90 tablet 0   pantoprazole (PROTONIX) 40 MG tablet Take 1 tablet Daily for Acid Indigestion & Heartburn 90 tablet 3   pravastatin (PRAVACHOL) 40 MG tablet Take 1 tablet at Bedtime for Cholesterol 90 tablet 3   No current facility-administered medications on file prior to visit.    Review of Systems: a complete, 10pt review of systems was completed with pertinent positives and negatives as documented in the HPI  Physical Exam: Weight: 160 lb   Height: 63 in  Body Surface Area: 1.76 m   Body Mass Index: 28.34 kg/m   Temp.: 98.8 F    Pulse: 63 (Regular)    P.OX: 82% (Room air) BP: 122/82(Sitting, Left Arm, Standard)   Gen: alert and well appearing Eye: extraocular motion intact, no scleral icterus ENT: moist mucus membranes, dentition intact Neck: no mass or thyromegaly Chest: unlabored respirations, symmetrical air entry, clear bilaterally CV: regular rate and rhythm, no pedal edema Abdomen: soft, nontender, nondistended. No mass or organomegaly. Well-healed right upper quadrant scar, umbilical scar. Port sites from prior laparoscopic hernia repair are not really visible. There is a reducible large left inguinal hernia. Laxity on the right with some likely fat in the inguinal canal but no palpable hernia on the right. MSK: strength symmetrical throughout, no deformity Neuro: grossly intact, normal gait Psych: normal mood and affect, appropriate insight Skin: warm and dry, no rash or lesion on limited exam   CBC Latest Ref Rng & Units 10/08/2019 07/01/2019 02/19/2019  WBC 3.8 - 10.8 Thousand/uL 7.4 6.6 7.8  Hemoglobin 11.7  - 15.5 g/dL 12.5 12.6 12.4  Hematocrit 35 - 45 % 38.8 37.8 36.3  Platelets 140 - 400 Thousand/uL 84(L) 70(L) 95(L)    CMP Latest Ref Rng & Units 10/08/2019 07/01/2019 02/19/2019  Glucose 65 - 99 mg/dL 73 87 79  BUN 7 - 25 mg/dL 33(H) 29(H) 28(H)  Creatinine 0.60 - 0.93 mg/dL 1.38(H) 1.08(H) 1.25(H)  Sodium 135 - 146 mmol/L 142 141 140  Potassium 3.5 - 5.3 mmol/L 4.1 4.6 4.2  Chloride 98 - 110 mmol/L 105 107 104  CO2 20 - 32 mmol/L 30 26 26   Calcium 8.6 - 10.4 mg/dL 10.5(H) 9.6 10.0  Total Protein 6.1 - 8.1 g/dL 6.5 6.4 6.2  Total Bilirubin 0.2 - 1.2 mg/dL 0.5 0.5 0.4  Alkaline Phos 39 - 117 U/L - - -  AST 10 - 35 U/L 15 19 18   ALT 6 - 29 U/L 13 17 15     Lab Results  Component Value Date   INR 0.9 11/29/2007    Imaging: No results found.   A/P: LEFT INGUINAL HERNIA (K40.90) Story: Somewhat large, clearly intermittently contains bowel, reducible. Minimally symptomatic. Given size I recommend an open hernia repair with mesh and we discussed the procedure and risks of bleeding, infection, pain, scarring, injury to intra-abdominal or retroperitoneal structures including bowel, bladder, nerves, blood vessels, etc., discussed risk of chronic pain or numbness in the groin, discussed risk of hernia recurrence. We also discussed the general risks of anesthesia including cardiac, pulmonary and vascular complications. Questions were answered. We discussed signs and symptoms of incarceration which should prompt her to seek emergency treatment. She is ready to proceed with scheduling.    Patient Active Problem List   Diagnosis Date Noted   Migraine without status migrainosus, not intractable 10/08/2019   Inguinal hernia 02/19/2019   Mammogram abnormal 08/07/2018   Thrombocytopenia ([place]) 11/25/2015   Encounter for Medicare annual wellness exam 06/14/2015   Overweight (BMI 25.0-29.9) 01/28/2015   CKD (chronic kidney disease) stage 3, GFR 30-59 ml/min (HCC) 01/19/2014   Upper airway cough  syndrome 12/16/2013   Vitamin D deficiency 05/21/2013   Other abnormal glucose (prediabetes) 05/21/2013   Medication management 05/21/2013   Essential hypertension 01/31/2013   Degenerative joint disease    GERD (gastroesophageal reflux disease)    Hyperlipidemia    Hypothyroidism        Romana Juniper, MD [facility], [place] to contact appropriate on-call provider

## 2019-11-12 DIAGNOSIS — Z419 Encounter for procedure for purposes other than remedying health state, unspecified: Secondary | ICD-10-CM | POA: Diagnosis not present

## 2019-11-12 DIAGNOSIS — D2261 Melanocytic nevi of right upper limb, including shoulder: Secondary | ICD-10-CM | POA: Diagnosis not present

## 2019-11-12 DIAGNOSIS — Z85828 Personal history of other malignant neoplasm of skin: Secondary | ICD-10-CM | POA: Diagnosis not present

## 2019-11-12 DIAGNOSIS — L821 Other seborrheic keratosis: Secondary | ICD-10-CM | POA: Diagnosis not present

## 2019-11-26 DIAGNOSIS — G43719 Chronic migraine without aura, intractable, without status migrainosus: Secondary | ICD-10-CM | POA: Diagnosis not present

## 2019-11-26 DIAGNOSIS — G43019 Migraine without aura, intractable, without status migrainosus: Secondary | ICD-10-CM | POA: Diagnosis not present

## 2019-11-26 DIAGNOSIS — G43111 Migraine with aura, intractable, with status migrainosus: Secondary | ICD-10-CM | POA: Diagnosis not present

## 2019-11-26 DIAGNOSIS — M791 Myalgia, unspecified site: Secondary | ICD-10-CM | POA: Diagnosis not present

## 2019-11-26 DIAGNOSIS — M542 Cervicalgia: Secondary | ICD-10-CM | POA: Diagnosis not present

## 2019-11-26 DIAGNOSIS — G518 Other disorders of facial nerve: Secondary | ICD-10-CM | POA: Diagnosis not present

## 2019-12-08 DIAGNOSIS — Z79899 Other long term (current) drug therapy: Secondary | ICD-10-CM | POA: Diagnosis not present

## 2019-12-08 DIAGNOSIS — Z01812 Encounter for preprocedural laboratory examination: Secondary | ICD-10-CM | POA: Diagnosis not present

## 2019-12-08 DIAGNOSIS — M2041 Other hammer toe(s) (acquired), right foot: Secondary | ICD-10-CM | POA: Diagnosis not present

## 2019-12-08 DIAGNOSIS — L600 Ingrowing nail: Secondary | ICD-10-CM | POA: Diagnosis not present

## 2019-12-09 DIAGNOSIS — N281 Cyst of kidney, acquired: Secondary | ICD-10-CM | POA: Diagnosis not present

## 2019-12-09 DIAGNOSIS — N183 Chronic kidney disease, stage 3 unspecified: Secondary | ICD-10-CM | POA: Diagnosis not present

## 2019-12-09 DIAGNOSIS — Z6827 Body mass index (BMI) 27.0-27.9, adult: Secondary | ICD-10-CM | POA: Diagnosis not present

## 2019-12-09 DIAGNOSIS — I129 Hypertensive chronic kidney disease with stage 1 through stage 4 chronic kidney disease, or unspecified chronic kidney disease: Secondary | ICD-10-CM | POA: Diagnosis not present

## 2019-12-11 DIAGNOSIS — N183 Chronic kidney disease, stage 3 unspecified: Secondary | ICD-10-CM | POA: Diagnosis not present

## 2019-12-19 DIAGNOSIS — Z01812 Encounter for preprocedural laboratory examination: Secondary | ICD-10-CM | POA: Diagnosis not present

## 2019-12-19 DIAGNOSIS — Z20822 Contact with and (suspected) exposure to covid-19: Secondary | ICD-10-CM | POA: Diagnosis not present

## 2019-12-25 ENCOUNTER — Other Ambulatory Visit: Payer: Self-pay

## 2019-12-25 ENCOUNTER — Ambulatory Visit: Payer: Medicare PPO | Admitting: Adult Health

## 2019-12-25 ENCOUNTER — Encounter: Payer: Self-pay | Admitting: Adult Health

## 2019-12-25 VITALS — BP 120/86 | HR 66 | Temp 97.4°F | Wt 166.0 lb

## 2019-12-25 DIAGNOSIS — K219 Gastro-esophageal reflux disease without esophagitis: Secondary | ICD-10-CM | POA: Diagnosis not present

## 2019-12-25 DIAGNOSIS — R0989 Other specified symptoms and signs involving the circulatory and respiratory systems: Secondary | ICD-10-CM | POA: Diagnosis not present

## 2019-12-25 DIAGNOSIS — I10 Essential (primary) hypertension: Secondary | ICD-10-CM | POA: Diagnosis not present

## 2019-12-25 DIAGNOSIS — L600 Ingrowing nail: Secondary | ICD-10-CM | POA: Diagnosis not present

## 2019-12-25 DIAGNOSIS — Z7982 Long term (current) use of aspirin: Secondary | ICD-10-CM | POA: Diagnosis not present

## 2019-12-25 DIAGNOSIS — Z85828 Personal history of other malignant neoplasm of skin: Secondary | ICD-10-CM | POA: Diagnosis not present

## 2019-12-25 DIAGNOSIS — M2041 Other hammer toe(s) (acquired), right foot: Secondary | ICD-10-CM | POA: Diagnosis not present

## 2019-12-25 NOTE — Progress Notes (Signed)
Assessment and Plan:  Jillian Rodgers was seen today for acute visit.  Diagnoses and all orders for this visit:  Labile hypertension Per HPI appears fairly severe hypertensive episode, but limited to stressful events (surgery); very well control off of meds since retirement in office Start keeping BP log for next few weeks; check twice a day at varying times and after stressful events;  Discuss at scheduled follow up Call if consistently over 130/80 Possible Jillian Rodgers may benefit from benzo prior to surgery; concern due to upcoming planned open hernia surgery; will encourage to discuss with surgeon and anesthesia team; may benefit from BB - atenolol 50 mg night prior to surgery, etc Continue DASH diet.   Reminder to go to the ER if any CP, SOB, nausea, dizziness, severe HA, changes vision/speech, left arm numbness and tingling and jaw pain.  Further disposition pending results of labs. Discussed med's effects and SE's.   Over 20 minutes of exam, counseling, chart review, and critical decision making was performed.   Future Appointments  Date Time Provider [place]  01/12/2020  2:30 PM Unk Pinto, MD [facility] None  01/31/2020 11:00 AM [facility] None  07/12/2020  3:00 PM Unk Pinto, MD [facility] None    ------------------------------------------------------------------------------------------------------------------   HPI BP 120/86   Pulse 66   Temp (!) 97.4 F (36.3 C)   Wt 166 lb (75.3 kg)   SpO2 99%   BMI 28.94 kg/m   74 y.o.female presents for evaluation due to high blood pressure. Hx of hypertension treated by unknown med, was able to taper off after retirement.   Jillian Rodgers reports this AM presented for bilateral foot surgery for hammer toes by Dr. Antony Blackbird, podiatrist under general anesthesia today in outpatient surgery center. Upon arrival, was found to be hypertensive 194/99, was feeling anxious but not severely so, during the procedure Jillian Rodgers BP apparently  remained persistently elevated - 174/89-189/115, pulse 72-103, O2 95-99% (presents log). Jillian Rodgers reports they indicated "heart looked fine." Jillian Rodgers never experienced BP, dyspnea, HA, vision changes. Jillian Rodgers was ultimately treated with apresoline 5 mg x 3 doses, discharged with BP "130s over 80 something" - and presents here for follow up and recheck as advised.   Jillian Rodgers has BP cuff at home but hasn't rechecked regularly in some time due to typically consistent excellent control, today their BP is BP: 120/86 106/68 by provider manual recheck.  On review, Jillian Rodgers has remained in 100-120/70s range for the last year Jillian Rodgers reports recently had normal BPs at nephrologist and skin surgery events Reports did have similar hyper  Jillian Rodgers does workout. Jillian Rodgers denies chest pain, shortness of breath, dizziness.    Lab Results  Component Value Date   GFRNONAA 37 (L) 10/08/2019   Jillian Rodgers is on thyroid medication. Jillian Rodgers medication was not changed last visit.   Lab Results  Component Value Date   TSH 3.83 10/08/2019  .    Past Medical History:  Diagnosis Date  . Degenerative joint disease   . Diabetes mellitus without complication ([place])   . GERD (gastroesophageal reflux disease)   . Hyperlipidemia   . Hypertension   . Migraines    neurontin helps  . Pre-diabetes   . Thyroid disease      No Known Allergies  Current Outpatient Medications on File Prior to Visit  Medication Sig  . Ascorbic Acid (VITAMIN C) 500 MG CAPS Take 1 capsule by mouth daily.  [name] Kitchen aspirin 81 MG tablet Take 81 mg by mouth daily.  [name] Kitchen BOTOX 100 units SOLR  injection Every 6 to 8 weeks for headaches  . Calcium Carbonate-Vit D-Min (CALCIUM 1200 PO) Take 1,200 Units by mouth daily.  . cetirizine (ZYRTEC) 10 MG tablet Take 10 mg by mouth daily.  . Cholecalciferol (VITAMIN D PO) Take 5,000 Units by mouth daily.  [name] Kitchen gabapentin (NEURONTIN) 600 MG tablet Take 600 mg by mouth 4 (four) times daily.   . hyoscyamine (LEVSIN SL) 0.125 MG SL tablet DISSOLVE 1 TO 2 TABLETS  UNDER THE TONGUE EVERY 4 HOURS AS NEEDED FOR NAUSEA OR CRAMPING OR DIARRHEA  . levothyroxine (SYNTHROID) 50 MCG tablet Take 1 tablet daily on an empty stomach with only water for 30 minutes & no Antacid meds, Calcium or Magnesium for 4 hours & avoid Biotin  . pantoprazole (PROTONIX) 40 MG tablet Take 1 tablet Daily for Acid Indigestion & Heartburn  . pravastatin (PRAVACHOL) 40 MG tablet Take 1 tablet at Bedtime for Cholesterol   No current facility-administered medications on file prior to visit.    ROS: all negative except above.   Physical Exam:  BP 120/86   Pulse 66   Temp (!) 97.4 F (36.3 C)   Wt 166 lb (75.3 kg)   SpO2 99%   BMI 28.94 kg/m   General Appearance: Well nourished, in no apparent distress. Eyes: PERRLA, EOMs, conjunctiva no swelling or erythema ENT/Mouth: pharynx normal, MMM, no lesions. Hearing normal.  Neck: Supple, thyroid normal.  Respiratory: Respiratory effort normal, BS equal bilaterally without rales, rhonchi, wheezing or stridor.  Cardio: RRR with no MRGs. Brisk peripheral pulses without edema.  Lymphatics: Non tender without lymphadenopathy.  Musculoskeletal: Bil foot braces, dressings in place; antalgic gait Skin: Warm, dry without rashes, lesions, ecchymosis.  Neuro: Cranial nerves intact. Normal muscle tone, no cerebellar symptoms.  Psych: Awake and oriented X 3, mildly anxious/tearful affect, Insight and Judgment appropriate.     Izora Ribas, NP 4:14 PM [facility]

## 2019-12-25 NOTE — Patient Instructions (Addendum)
Goals    . Blood Pressure < 130/80    . Exercise 150 min/wk Moderate Activity         HYPERTENSION INFORMATION  Monitor your blood pressure at home, please keep a record and bring that in with you to your next office visit.   Go to the ER if any CP, SOB, nausea, dizziness, severe HA, changes vision/speech  Testing/Procedures: HOW TO TAKE YOUR BLOOD PRESSURE:  Rest 5 minutes before taking your blood pressure.  Don't smoke or drink caffeinated beverages for at least 30 minutes before.  Take your blood pressure before (not after) you eat.  Sit comfortably with your back supported and both feet on the floor (don't cross your legs).  Elevate your arm to heart level on a table or a desk.  Use the proper sized cuff. It should fit smoothly and snugly around your bare upper arm. There should be enough room to slip a fingertip under the cuff. The bottom edge of the cuff should be 1 inch above the crease of the elbow.  Your most recent BP: BP: 120/86   Take your medications faithfully as instructed. Maintain a healthy weight. Get at least 150 minutes of aerobic exercise per week. Minimize salt intake. Minimize alcohol intake  DASH Eating Plan DASH stands for "Dietary Approaches to Stop Hypertension." The DASH eating plan is a healthy eating plan that has been shown to reduce high blood pressure (hypertension). Additional health benefits may include reducing the risk of type 2 diabetes mellitus, heart disease, and stroke. The DASH eating plan may also help with weight loss. WHAT DO I NEED TO KNOW ABOUT THE DASH EATING PLAN? For the DASH eating plan, you will follow these general guidelines:  Choose foods with a percent daily value for sodium of less than 5% (as listed on the food label).  Use salt-free seasonings or herbs instead of table salt or sea salt.  Check with your health care provider or pharmacist before using salt substitutes.  Eat lower-sodium products, often labeled  as "lower sodium" or "no salt added."  Eat fresh foods.  Eat more vegetables, fruits, and low-fat dairy products.  Choose whole grains. Look for the word "whole" as the first word in the ingredient list.  Choose fish and skinless chicken or [place] more often than red meat. Limit fish, poultry, and meat to 6 oz (170 g) each day.  Limit sweets, desserts, sugars, and sugary drinks.  Choose heart-healthy fats.  Limit cheese to 1 oz (28 g) per day.  Eat more home-cooked food and less restaurant, buffet, and fast food.  Limit fried foods.  Cook foods using methods other than frying.  Limit canned vegetables. If you do use them, rinse them well to decrease the sodium.  When eating at a restaurant, ask that your food be prepared with less salt, or no salt if possible. WHAT FOODS CAN I EAT? Seek help from a dietitian for individual calorie needs. Grains Whole grain or whole wheat bread. Brown rice. Whole grain or whole wheat pasta. Quinoa, bulgur, and whole grain cereals. Low-sodium cereals. Corn or whole wheat flour tortillas. Whole grain cornbread. Whole grain crackers. Low-sodium crackers. Vegetables Fresh or frozen vegetables (raw, steamed, roasted, or grilled). Low-sodium or reduced-sodium tomato and vegetable juices. Low-sodium or reduced-sodium tomato sauce and paste. Low-sodium or reduced-sodium canned vegetables.  Fruits All fresh, canned (in natural juice), or frozen fruits. Meat and Other Protein Products Ground beef (85% or leaner), grass-fed beef, or beef trimmed of  fat. Skinless chicken or [place]. Ground chicken or [place]. Pork trimmed of fat. All fish and seafood. Eggs. Dried beans, peas, or lentils. Unsalted nuts and seeds. Unsalted canned beans. Dairy Low-fat dairy products, such as skim or 1% milk, 2% or reduced-fat cheeses, low-fat ricotta or cottage cheese, or plain low-fat yogurt. Low-sodium or reduced-sodium cheeses. Fats and Oils Tub margarines without trans fats.  Light or reduced-fat mayonnaise and salad dressings (reduced sodium). Avocado. Safflower, olive, or canola oils. Natural peanut or almond butter. Other Unsalted popcorn and pretzels. The items listed above may not be a complete list of recommended foods or beverages. Contact your dietitian for more options. WHAT FOODS ARE NOT RECOMMENDED? Grains White bread. White pasta. White rice. Refined cornbread. Bagels and croissants. Crackers that contain trans fat. Vegetables Creamed or fried vegetables. Vegetables in a cheese sauce. Regular canned vegetables. Regular canned tomato sauce and paste. Regular tomato and vegetable juices. Fruits Dried fruits. Canned fruit in light or heavy syrup. Fruit juice. Meat and Other Protein Products Fatty cuts of meat. Ribs, chicken wings, bacon, sausage, bologna, salami, chitterlings, fatback, hot dogs, bratwurst, and packaged luncheon meats. Salted nuts and seeds. Canned beans with salt. Dairy Whole or 2% milk, cream, half-and-half, and cream cheese. Whole-fat or sweetened yogurt. Full-fat cheeses or blue cheese. Nondairy creamers and whipped toppings. Processed cheese, cheese spreads, or cheese curds. Condiments Onion and garlic salt, seasoned salt, table salt, and sea salt. Canned and packaged gravies. Worcestershire sauce. Tartar sauce. Barbecue sauce. Teriyaki sauce. Soy sauce, including reduced sodium. Steak sauce. Fish sauce. Oyster sauce. Cocktail sauce. Horseradish. Ketchup and mustard. Meat flavorings and tenderizers. Bouillon cubes. Hot sauce. Tabasco sauce. Marinades. Taco seasonings. Relishes. Fats and Oils Butter, stick margarine, lard, shortening, ghee, and bacon fat. Coconut, palm kernel, or palm oils. Regular salad dressings. Other Pickles and olives. Salted popcorn and pretzels. The items listed above may not be a complete list of foods and beverages to avoid. Contact your dietitian for more information. WHERE CAN I FIND MORE  INFORMATION? National Heart, Lung, and Blood Institute: travelstabloid.com Document Released: 03/23/2011 Document Revised: 08/18/2013 Document Reviewed: 02/05/2013 [facility] Patient Information 2015 [place], [place]. This information is not intended to replace advice given to you by your health care provider. Make sure you discuss any questions you have with your health care provider.

## 2019-12-29 DIAGNOSIS — M65871 Other synovitis and tenosynovitis, right ankle and foot: Secondary | ICD-10-CM | POA: Diagnosis not present

## 2019-12-29 DIAGNOSIS — M65872 Other synovitis and tenosynovitis, left ankle and foot: Secondary | ICD-10-CM | POA: Diagnosis not present

## 2020-01-05 DIAGNOSIS — M25571 Pain in right ankle and joints of right foot: Secondary | ICD-10-CM | POA: Diagnosis not present

## 2020-01-11 ENCOUNTER — Encounter: Payer: Self-pay | Admitting: Internal Medicine

## 2020-01-11 NOTE — Progress Notes (Signed)
History of Present Illness:       This very nice 74 y.o.  MWF presents for 6 month follow up with HTN, HLD, Pre-Diabetes and Vitamin D Deficiency.  Patient has GERD controlled on her meds.       Patient is scheduled for [facility] repair on Oct 20 by Dr Romana Juniper.           Patient has been followed with labile  HTN  (1990's) & BP has been controlled at home.  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 Today's BP is at goal - 126/80. Patient has had no complaints of any cardiac type chest pain, palpitations, dyspnea / orthopnea / PND, dizziness, claudication, or dependent edema.      Hyperlipidemia is controlled with diet & Pravastatin. Patient denies myalgias or other med SE's. Last Lipids were at goal with slightly elevated Trig's:  Lab Results  Component Value Date   CHOL 154 10/08/2019   HDL 74 10/08/2019   LDLCALC 54 10/08/2019   TRIG 181 (H) 10/08/2019   CHOLHDL 2.1 10/08/2019    Also, the patient has history of PreDiabetes (A1c 6.0% / 2012) and has had no symptoms of reactive hypoglycemia, diabetic polys, paresthesias or visual blurring.  Last A1c was at goal:  Lab Results  Component Value Date   HGBA1C 5.6 07/01/2019                                              Patient has been on thyroid replacement since she was dx'd Hypothyroid in the 46's.       Further, the patient also has history of Vitamin D Deficiency and supplements vitamin D without any suspected side-effects. Last vitamin D was at goal:  Lab Results  Component Value Date   VD25OH 62 07/01/2019    Current Outpatient Medications on File Prior to Visit  Medication Sig  . VITAMIN C 500 MG  Take 1 capsule  daily.  [name] Kitchen aspirin 81 MG tablet Take  daily.  [name] Kitchen BOTOX 100 units SOLR injec Every 6 to 8 weeks for headaches  . Calcium-Vit D-Min (1200 mg) Take  daily.  . cetirizine 10 MG tablet Take daily.  [name] Kitchen VITAMIN D Take 5,000  Units daily.  [name] Kitchen gabapentin  600 MG tablet Take 4  times daily.   [name] Kitchen levothyroxine 50 MCG tablet Take 1 tablet daily   . pantoprazole 40 MG tablet Take 1 tablet Daily  . pravastatin 40 MG tablet Take 1 tablet at Bedtime     No Known Allergies  PMHx:   Past Medical History:  Diagnosis Date  . Degenerative joint disease   . Diabetes mellitus without complication ([place])   . GERD (gastroesophageal reflux disease)   . Hyperlipidemia   . Hypertension   . Migraines    neurontin helps  . Pre-diabetes   . Thyroid disease     Immunization History  Administered Date(s) Administered  . Influenza Inj Mdck Quad With Preservative 01/15/2018  . Influenza, High Dose Seasonal PF 12/29/2013, 01/15/2015, 02/01/2016, 02/28/2017, 02/07/2018, 12/30/2018  . Influenza-Unspecified 01/24/2011, 02/02/2012, 01/16/2015  . PFIZER SARS-COV-2 Vaccination 05/12/2019, 06/02/2019  . Pneumococcal Conjugate-13 10/28/2014  . Pneumococcal-Unspecified 11/28/2011  . Td 11/17/2010  . Tdap 11/12/2014  . Zoster 11/17/2010  Past Surgical History:  Procedure Laterality Date  . CHOLECYSTECTOMY  1996   open  . COLONOSCOPY  2009   recommended 10 year f/u  . HERNIA REPAIR  1985   umb hernia rpr  . HERNIA REPAIR  2005 & 2009   ventral hernia rpr  . PILONIDAL CYST EXCISION  1972  . TONSILLECTOMY    . UPPER GI ENDOSCOPY    . VENTRAL HERNIA REPAIR      FHx:    Reviewed / unchanged  SHx:    Reviewed / unchanged   Systems Review:  Constitutional: Denies fever, chills, wt changes, headaches, insomnia, fatigue, night sweats, change in appetite. Eyes: Denies redness, blurred vision, diplopia, discharge, itchy, watery eyes.  ENT: Denies discharge, congestion, post nasal drip, epistaxis, sore throat, earache, hearing loss, dental pain, tinnitus, vertigo, sinus pain, snoring.  CV: Denies chest pain, palpitations, irregular heartbeat, syncope, dyspnea, diaphoresis, orthopnea, PND, claudication or edema. Respiratory:  denies cough, dyspnea, DOE, pleurisy, hoarseness, laryngitis, wheezing.  Gastrointestinal: Denies dysphagia, odynophagia, heartburn, reflux, water brash, abdominal pain or cramps, nausea, vomiting, bloating, diarrhea, constipation, hematemesis, melena, hematochezia  or hemorrhoids. Genitourinary: Denies dysuria, frequency, urgency, nocturia, hesitancy, discharge, hematuria or flank pain. Musculoskeletal: Denies arthralgias, myalgias, stiffness, jt. swelling, pain, limping or strain/sprain.  Skin: Denies pruritus, rash, hives, warts, acne, eczema or change in skin lesion(s). Neuro: No weakness, tremor, incoordination, spasms, paresthesia or pain. Psychiatric: Denies confusion, memory loss or sensory loss. Endo: Denies change in weight, skin or hair change.  Heme/Lymph: No excessive bleeding, bruising or enlarged lymph nodes.  Physical Exam  BP 126/80   Pulse 88   Temp (!) 97 F (36.1 C)   Resp 16   Ht 5\' 3"  (1.6 m)   Wt 158 lb (71.7 kg)   BMI 27.99 kg/m   Appears  well nourished, well groomed  and in no distress.  Eyes: PERRLA, EOMs, conjunctiva no swelling or erythema. Sinuses: No frontal/maxillary tenderness ENT/Mouth: EAC's clear, TM's nl w/o erythema, bulging. Nares clear w/o erythema, swelling, exudates. Oropharynx clear without erythema or exudates. Oral hygiene is good. Tongue normal, non obstructing. Hearing intact.  Neck: Supple. Thyroid not palpable. Car 2+/2+ without bruits, nodes or JVD. Chest: Respirations nl with BS clear & equal w/o rales, rhonchi, wheezing or stridor.  Cor: Heart sounds normal w/ regular rate and rhythm without sig. murmurs, gallops, clicks or rubs. Peripheral pulses normal and equal  without edema.  Abdomen: Soft & bowel sounds normal. Non-tender w/o guarding, rebound, hernias, masses or organomegaly.  Lymphatics: Unremarkable.  Musculoskeletal: Full ROM all peripheral extremities, joint stability, 5/5 strength and normal gait.  Skin: Warm, dry  without exposed rashes, lesions or ecchymosis apparent.  Neuro: Cranial nerves intact, reflexes equal bilaterally. Sensory-motor testing grossly intact. Tendon reflexes grossly intact.  Pysch: Alert & oriented x 3.  Insight and judgement nl & appropriate. No ideations.  Assessment and Plan:  1. Labile hypertension  - Continue medication, monitor blood pressure at home.  - Continue DASH diet.  Reminder to go to the ER if any CP,  SOB, nausea, dizziness, severe HA, changes vision/speech.  - CBC with Differential/Platelet - COMPLETE METABOLIC PANEL WITH GFR - Magnesium - TSH  2. Hyperlipidemia, mixed  - Continue diet/meds, exercise,& lifestyle modifications.  - Continue monitor periodic cholesterol/liver & renal functions   - Lipid panel - TSH  3. Abnormal glucose  - Continue diet, exercise  - Lifestyle modifications.  - Monitor appropriate labs.  - Hemoglobin A1c - Insulin, random  4. Stage 3b chronic kidney disease (HCC)  - COMPLETE METABOLIC PANEL WITH GFR  5. Vitamin D deficiency  - Continue supplementation.  - VITAMIN D 25 Hydroxy  6. Hypothyroidism  - TSH  7. Medication management  - CBC with Differential/Platelet - COMPLETE METABOLIC PANEL WITH GFR - Magnesium - Lipid panel - TSH - Hemoglobin A1c - Insulin, random - VITAMIN D 25 Hydroxy        Discussed  regular exercise, BP monitoring, weight control to achieve/maintain BMI less than 25 and discussed med and SE's. Recommended labs to assess and monitor clinical status with further disposition pending results of labs.  I discussed the assessment and treatment plan with the patient. The patient was provided an opportunity to ask questions and all were answered. The patient agreed with the plan and demonstrated an understanding of the instructions.  I provided over 30 minutes of exam, counseling, chart review and  complex critical decision making.    Kirtland Bouchard, MD

## 2020-01-11 NOTE — Patient Instructions (Signed)

## 2020-01-12 ENCOUNTER — Ambulatory Visit: Payer: Medicare PPO | Admitting: Internal Medicine

## 2020-01-12 ENCOUNTER — Other Ambulatory Visit: Payer: Self-pay

## 2020-01-12 VITALS — BP 126/80 | HR 88 | Temp 97.0°F | Resp 16 | Ht 63.0 in | Wt 158.0 lb

## 2020-01-12 DIAGNOSIS — N1832 Chronic kidney disease, stage 3b: Secondary | ICD-10-CM

## 2020-01-12 DIAGNOSIS — T8131XA Disruption of external operation (surgical) wound, not elsewhere classified, initial encounter: Secondary | ICD-10-CM | POA: Diagnosis not present

## 2020-01-12 DIAGNOSIS — E782 Mixed hyperlipidemia: Secondary | ICD-10-CM

## 2020-01-12 DIAGNOSIS — R7309 Other abnormal glucose: Secondary | ICD-10-CM | POA: Diagnosis not present

## 2020-01-12 DIAGNOSIS — R0989 Other specified symptoms and signs involving the circulatory and respiratory systems: Secondary | ICD-10-CM

## 2020-01-12 DIAGNOSIS — Z79899 Other long term (current) drug therapy: Secondary | ICD-10-CM

## 2020-01-12 DIAGNOSIS — T8189XA Other complications of procedures, not elsewhere classified, initial encounter: Secondary | ICD-10-CM | POA: Diagnosis not present

## 2020-01-12 DIAGNOSIS — E559 Vitamin D deficiency, unspecified: Secondary | ICD-10-CM

## 2020-01-12 DIAGNOSIS — E039 Hypothyroidism, unspecified: Secondary | ICD-10-CM

## 2020-01-13 LAB — LIPID PANEL
Cholesterol: 149 mg/dL (ref <200)
HDL: 66 mg/dL (ref >=50)
LDL Cholesterol (Calc): 64 mg/dL (calc)
Non-HDL Cholesterol (Calc): 83 mg/dL (calc) (ref <130)
Total CHOL/HDL Ratio: 2.3 (calc) (ref <5.0)
Triglycerides: 105 mg/dL (ref <150)

## 2020-01-13 LAB — COMPLETE METABOLIC PANEL WITH GFR
AG Ratio: 1.5 (calc) (ref 1.0–2.5)
ALT: 16 U/L (ref 6–29)
AST: 16 U/L (ref 10–35)
Albumin: 4 g/dL (ref 3.6–5.1)
Alkaline phosphatase (APISO): 82 U/L (ref 37–153)
BUN/Creatinine Ratio: 18 (calc) (ref 6–22)
BUN: 26 mg/dL — ABNORMAL HIGH (ref 7–25)
CO2: 27 mmol/L (ref 20–32)
Calcium: 10.2 mg/dL (ref 8.6–10.4)
Chloride: 105 mmol/L (ref 98–110)
Creat: 1.43 mg/dL — ABNORMAL HIGH (ref 0.60–0.93)
GFR, Est African American: 41 mL/min/{1.73_m2} — ABNORMAL LOW (ref >=60)
GFR, Est Non African American: 36 mL/min/{1.73_m2} — ABNORMAL LOW (ref >=60)
Globulin: 2.6 g/dL (calc) (ref 1.9–3.7)
Glucose, Bld: 119 mg/dL — ABNORMAL HIGH (ref 65–99)
Potassium: 4.3 mmol/L (ref 3.5–5.3)
Sodium: 140 mmol/L (ref 135–146)
Total Bilirubin: 0.6 mg/dL (ref 0.2–1.2)
Total Protein: 6.6 g/dL (ref 6.1–8.1)

## 2020-01-13 LAB — CBC WITH DIFFERENTIAL/PLATELET
Absolute Monocytes: 585 cells/uL (ref 200–950)
Basophils Absolute: 62 cells/uL (ref 0–200)
Basophils Relative: 0.8 %
Eosinophils Absolute: 273 cells/uL (ref 15–500)
Eosinophils Relative: 3.5 %
HCT: 38.8 % (ref 35.0–45.0)
Hemoglobin: 12.9 g/dL (ref 11.7–15.5)
Lymphs Abs: 2005 cells/uL (ref 850–3900)
MCH: 30.3 pg (ref 27.0–33.0)
MCHC: 33.2 g/dL (ref 32.0–36.0)
MCV: 91.1 fL (ref 80.0–100.0)
MPV: 11 fL (ref 7.5–12.5)
Monocytes Relative: 7.5 %
Neutro Abs: 4875 cells/uL (ref 1500–7800)
Neutrophils Relative %: 62.5 %
Platelets: 83 10*3/uL — ABNORMAL LOW (ref 140–400)
RBC: 4.26 10*6/uL (ref 3.80–5.10)
RDW: 12.3 % (ref 11.0–15.0)
Total Lymphocyte: 25.7 %
WBC: 7.8 10*3/uL (ref 3.8–10.8)

## 2020-01-13 LAB — TSH: TSH: 1.61 mIU/L (ref 0.40–4.50)

## 2020-01-13 LAB — VITAMIN D 25 HYDROXY (VIT D DEFICIENCY, FRACTURES): Vit D, 25-Hydroxy: 96 ng/mL (ref 30–100)

## 2020-01-13 LAB — MAGNESIUM: Magnesium: 2.1 mg/dL (ref 1.5–2.5)

## 2020-01-13 LAB — HEMOGLOBIN A1C
Hgb A1c MFr Bld: 5.6 % of total Hgb (ref <5.7)
Mean Plasma Glucose: 114 (calc)
eAG (mmol/L): 6.3 (calc)

## 2020-01-13 LAB — INSULIN, RANDOM: Insulin: 83.8 u[IU]/mL — ABNORMAL HIGH

## 2020-01-13 NOTE — Progress Notes (Signed)
==========================================================  -    Kidney Functions - Still Stage 3b & Stable      (GFR  36)   (Copy sent to Dr Moshe Cipro)  ==========================================================  -  Total Chol = 149 and LDL Chol = 64 - Both  Excellent   - Very low risk for Heart Attack  / Stroke =============================================================  - A1c - Normal - Great  - No Diabetes ! ==========================================================  -  Vitamin D = 96 - Great  ==========================================================  -  All Else - CBC - Electrolytes - Liver - Magnesium & Thyroid    - all  Normal / OK ==========================================================

## 2020-01-14 DIAGNOSIS — G43019 Migraine without aura, intractable, without status migrainosus: Secondary | ICD-10-CM | POA: Diagnosis not present

## 2020-01-14 DIAGNOSIS — G43111 Migraine with aura, intractable, with status migrainosus: Secondary | ICD-10-CM | POA: Diagnosis not present

## 2020-01-14 DIAGNOSIS — M791 Myalgia, unspecified site: Secondary | ICD-10-CM | POA: Diagnosis not present

## 2020-01-14 DIAGNOSIS — G43719 Chronic migraine without aura, intractable, without status migrainosus: Secondary | ICD-10-CM | POA: Diagnosis not present

## 2020-01-14 DIAGNOSIS — G518 Other disorders of facial nerve: Secondary | ICD-10-CM | POA: Diagnosis not present

## 2020-01-14 DIAGNOSIS — M542 Cervicalgia: Secondary | ICD-10-CM | POA: Diagnosis not present

## 2020-01-21 NOTE — Patient Instructions (Addendum)
DUE TO COVID-19 ONLY ONE VISITOR IS ALLOWED TO COME WITH YOU AND STAY IN THE WAITING ROOM ONLY DURING PRE OP AND PROCEDURE DAY OF SURGERY. THE 1 VISITOR  MAY VISIT WITH YOU AFTER SURGERY IN YOUR PRIVATE ROOM DURING VISITING HOURS ONLY!  YOU NEED TO HAVE A COVID 19 TEST ON 01-31-2020 @ 1100 AM. THIS TEST MUST BE DONE BEFORE SURGERY,  COVID TESTING SITE [address] [place] [address], IT IS ON THE RIGHT GOING OUT [address] APPROXIMATELY  2 MINUTES PAST [employer] ON THE RIGHT. ONCE YOUR COVID TEST IS COMPLETED,  PLEASE BEGIN THE QUARANTINE INSTRUCTIONS AS OUTLINED IN YOUR HANDOUT.                [place]  \  Your procedure is scheduled on: 02-04-2020   Report to [facility] Main  Entrance   Report to admitting at 630 AM     Call this number if you have problems the morning of surgery [phone]    REMEMBER: NO  SOLID FOOD CANDY OR GUM AFTER MIDNIGHT. CLEAR LIQUIDS UNTIL 530 AM . NOTHING BY MOUTH EXCEPT CLEAR LIQUIDS UNTIL 530 AM .       CLEAR LIQUID DIET   Foods Allowed                                                                    Coffee and tea, regular and decaf                            Fruit ices (not with fruit pulp)                                      Iced Popsicles                                    Carbonated beverages, regular and diet                                    Cranberry, grape and apple juices Sports drinks like Gatorade Lightly seasoned clear broth or consume(fat free) Sugar, honey syrup ___________________________________________________________________      BRUSH YOUR TEETH MORNING OF SURGERY AND RINSE YOUR MOUTH OUT, NO CHEWING GUM CANDY OR MINTS.     Take these medicines the morning of surgery with A SIP OF WATER: GABAPENTIN, ZYRTEC, LEVOTHYROXINE, PANTAPRAZOLE                                You may not have any metal on your body including hair pins and              piercings  Do not  wear jewelry, make-up, lotions, powders or perfumes, deodorant             Do not wear nail polish on your fingernails.  Do not shave  48 hours prior to surgery.  Men may shave face and neck.   Do not bring valuables to the hospital. [place].  Contacts, dentures or bridgework may not be worn into surgery.  Leave suitcase in the car. After surgery it may be brought to your room.     Patients discharged the day of surgery will not be allowed to drive home. IF YOU ARE HAVING SURGERY AND GOING HOME THE SAME DAY, YOU MUST HAVE AN ADULT TO DRIVE YOU HOME AND BE WITH YOU FOR 24 HOURS. YOU MAY GO HOME BY TAXI OR UBER OR ORTHERWISE, BUT AN ADULT MUST ACCOMPANY YOU HOME AND STAY WITH YOU FOR 24 HOURS.  Name and phone number of your driver:  Special Instructions: N/A              Please read over the following fact sheets you were given: _____________________________________________________________________  [facility] - Preparing for Surgery Before surgery, you can play an important role.  Because skin is not sterile, your skin needs to be as free of germs as possible.  You can reduce the number of germs on your skin by washing with CHG (chlorahexidine gluconate) soap before surgery.  CHG is an antiseptic cleaner which kills germs and bonds with the skin to continue killing germs even after washing. Please DO NOT use if you have an allergy to CHG or antibacterial soaps.  If your skin becomes reddened/irritated stop using the CHG and inform your nurse when you arrive at Short Stay. Do not shave (including legs and underarms) for at least 48 hours prior to the first CHG shower.  You may shave your face/neck. Please follow these instructions carefully:  1.  Shower with CHG Soap the night before surgery and the  morning of Surgery.  2.  If you choose to wash your hair, wash your hair first as usual with your  normal  shampoo.  3.  After you  shampoo, rinse your hair and body thoroughly to remove the  shampoo.                           4.  Use CHG as you would any other liquid soap.  You can apply chg directly  to the skin and wash                       Gently with a scrungie or clean washcloth.  5.  Apply the CHG Soap to your body ONLY FROM THE NECK DOWN.   Do not use on face/ open                           Wound or open sores. Avoid contact with eyes, ears mouth and genitals (private parts).                       Wash face,  Genitals (private parts) with your normal soap.             6.  Wash thoroughly, paying special attention to the area where your surgery  will be performed.  7.  Thoroughly rinse your body with warm water from the neck down.  8.  DO NOT shower/wash with your normal soap after using and rinsing off  the CHG Soap.  9.  Pat yourself dry with a clean towel.            10.  Wear clean pajamas.            11.  Place clean sheets on your bed the night of your first shower and do not  sleep with pets. Day of Surgery : Do not apply any lotions/deodorants the morning of surgery.  Please wear clean clothes to the hospital/surgery center.  FAILURE TO FOLLOW THESE INSTRUCTIONS MAY RESULT IN THE CANCELLATION OF YOUR SURGERY PATIENT SIGNATURE_________________________________  NURSE SIGNATURE__________________________________  ________________________________________________________________________

## 2020-01-22 ENCOUNTER — Other Ambulatory Visit: Payer: Self-pay

## 2020-01-22 ENCOUNTER — Ambulatory Visit: Payer: Medicare PPO | Attending: Internal Medicine

## 2020-01-22 DIAGNOSIS — Z23 Encounter for immunization: Secondary | ICD-10-CM

## 2020-01-22 NOTE — Progress Notes (Signed)
   Covid-19 Vaccination Clinic  Name:  Jillian Rodgers    MRN: [id number] DOB: [birth year]  01/22/2020  [name] was observed post Covid-19 immunization for 15 minutes without incident. She was provided with Vaccine Information Sheet and instruction to access the V-Safe system.   [name] was instructed to call 911 with any severe reactions post vaccine: [name] Kitchen Difficulty breathing  . Swelling of face and throat  . A fast heartbeat  . A bad rash all over body  . Dizziness and weakness

## 2020-01-26 ENCOUNTER — Other Ambulatory Visit: Payer: Self-pay | Admitting: Internal Medicine

## 2020-01-26 ENCOUNTER — Other Ambulatory Visit: Payer: Self-pay

## 2020-01-26 ENCOUNTER — Encounter (HOSPITAL_COMMUNITY)
Admission: RE | Admit: 2020-01-26 | Discharge: 2020-01-26 | Disposition: A | Discharge status: Home / Self Care | Payer: Medicare PPO | Source: Ambulatory Visit | Attending: Surgery | Admitting: Surgery

## 2020-01-26 ENCOUNTER — Encounter (HOSPITAL_COMMUNITY): Payer: Self-pay

## 2020-01-26 DIAGNOSIS — E039 Hypothyroidism, unspecified: Secondary | ICD-10-CM

## 2020-01-26 DIAGNOSIS — Z01812 Encounter for preprocedural laboratory examination: Secondary | ICD-10-CM | POA: Insufficient documentation

## 2020-01-26 HISTORY — DX: Hypothyroidism, unspecified: E03.9

## 2020-01-26 HISTORY — DX: Chronic kidney disease, unspecified: N18.9

## 2020-01-26 HISTORY — DX: Personal history of urinary calculi: Z87.442

## 2020-01-26 HISTORY — DX: Malignant (primary) neoplasm, unspecified: C80.1

## 2020-01-26 LAB — CBC
HCT: 40.2 % (ref 36.0–46.0)
Hemoglobin: 13.1 g/dL (ref 12.0–15.0)
MCH: 30.3 pg (ref 26.0–34.0)
MCHC: 32.6 g/dL (ref 30.0–36.0)
MCV: 93.1 fL (ref 80.0–100.0)
Platelets: 51 10*3/uL — ABNORMAL LOW (ref 150–400)
RBC: 4.32 MIL/uL (ref 3.87–5.11)
RDW: 12.2 % (ref 11.5–15.5)
WBC: 6.5 10*3/uL (ref 4.0–10.5)
nRBC: 0 % (ref 0.0–0.2)

## 2020-01-26 LAB — BASIC METABOLIC PANEL
Anion gap: 9 (ref 5–15)
BUN: 27 mg/dL — ABNORMAL HIGH (ref 8–23)
CO2: 26 mmol/L (ref 22–32)
Calcium: 10 mg/dL (ref 8.9–10.3)
Chloride: 107 mmol/L (ref 98–111)
Creatinine, Ser: 1.29 mg/dL — ABNORMAL HIGH (ref 0.44–1.00)
GFR, Estimated: 40 mL/min — ABNORMAL LOW (ref >60)
Glucose, Bld: 97 mg/dL (ref 70–99)
Potassium: 4.1 mmol/L (ref 3.5–5.1)
Sodium: 142 mmol/L (ref 135–145)

## 2020-01-26 LAB — GLUCOSE, CAPILLARY: Glucose-Capillary: 107 mg/dL — ABNORMAL HIGH (ref 70–99)

## 2020-01-26 MED ORDER — LEVOTHYROXINE SODIUM 50 MCG PO TABS: ORAL_TABLET | ORAL | 0 refills | Status: DC

## 2020-01-26 NOTE — Progress Notes (Addendum)
Called pt at home to check on status of blood pressure at home.  Blood pressure at time of preop was 83/66.  Blood pressure at home per pt is 147/90.  On 01/23/2020 pt states it was 72/43 per pt readings at home.  Patient states she checkes every day or so at home in the am and pm because blood pressure is up and down.  Patient denies any dizziness or lightheadedness.   Patient instructed to watch blood pressure at home.  Patient voiced understanding.  Husband on phone at time of phone call also.   Instructed pt to go to ED if experiences any dizziness, lightheadedness shortness of breath or chest pain and to monitor blood pressures and to keep in touch with DR Melford Aase regarding blood pressures.

## 2020-01-26 NOTE — Progress Notes (Addendum)
CBC and BMP  platelet count of 51 routed via epic to DR Kae Heller.  Roanna Banning made aware of platelet count of 51.

## 2020-01-26 NOTE — Progress Notes (Addendum)
Anesthesia Review:  PCP: DR Myriam Forehand- 01/12/2020  Cardiologist : Chest x-ray : EKG : 07/11/19- epic  Echo : Stress test: years ago per pt  Cardiac Cath :  Activity level: can not do a flight of stairs due to orthopedic issues per pt  Sleep Study/ CPAP : Fasting Blood Sugar :      / Checks Blood Sugar -- times a day:   Blood Thinner/ Instructions /Last Dose: ASA / Instructions/ Last Dose :  Chest pain recently per pt which she states DR Melford Aase is aware of  Pt denies being diabetic or prediabetic  Pt reports blood pressure up and down - Dr Ileene Rubens does not want to put on meds  Followed by Dr Moshe Cipro - [facility] per pt  CBC - platelet count of 51 and BMP result routed via epic to Dr Kae Heller.  Roanna Banning made aware of CBC result.

## 2020-01-27 ENCOUNTER — Encounter (HOSPITAL_COMMUNITY): Payer: Self-pay | Admitting: Physician Assistant

## 2020-01-29 NOTE — Progress Notes (Addendum)
Anesthesia Chart Review   Case: [id number] Date/Time: 02/04/20 0815   Procedure: OPEN LEFT INGUINAL HERNIA REPAIR (Left )   Anesthesia type: General   Pre-op diagnosis: hernia   Location: [facility] ROOM 01 / [facility]   Surgeons: Clovis Riley, MD      DISCUSSION:73 y.o. never smoker with h/o labile HTN (evaluate DOS, pt checks regularly at home, followed closely by PCP), GERD, pre-diabetes, thrombocytopenia, hernia scheduled for above procedure 02/04/2020 with Dr. Romana Juniper.   Pt last seen by PCP 01/12/2020.  Stable at this visit.   Platelets 51 at PAT 01/26/2020, appears to be chronically low.  Discussed with triage nurse at Dr. Ron Parker office who will inform Dr. Kae Heller.  Faxed to PCP as well.   Addendum 01/29/2020: Per surgeon's office labs will be rechecked 02/02/2020.  Will follow up.   Addendum 02/02/2020:  Labs rechecked today, Platelets 86.  Labs on chart.  VS: BP (!) 83/66   Temp (!) 35.6 C (Oral)   Ht 5\' 4"  (1.626 m)   Wt 72 kg   SpO2 100%   BMI 27.26 kg/m   PROVIDERS: Unk Pinto, MD is PCP    LABS: Labs reviewed: Acceptable for surgery. (all labs ordered are listed, but only abnormal results are displayed)  Labs Reviewed  BASIC METABOLIC PANEL - Abnormal; Notable for the following components:      Result Value   BUN 27 (*)    Creatinine, Ser 1.29 (*)    GFR, Estimated 40 (*)    All other components within normal limits  CBC - Abnormal; Notable for the following components:   Platelets 51 (*)    All other components within normal limits  GLUCOSE, CAPILLARY - Abnormal; Notable for the following components:   Glucose-Capillary 107 (*)    All other components within normal limits     IMAGES:   EKG: 07/01/2019 Rate 64 bpm NSR  CV:  Past Medical History:  Diagnosis Date  . Cancer (HCC)    HX OF SKIN CANCER   . Chronic kidney disease   . Degenerative joint disease   . GERD (gastroesophageal reflux disease)   . History of kidney stones   .  Hyperlipidemia   . Hypertension   . Hypothyroidism   . Migraines    neurontin helps  . Pre-diabetes    LAST 2 TIMES NORMAL PER PT   . Thyroid disease     Past Surgical History:  Procedure Laterality Date  . BILATERAL CATARACT SURGERY     . [place]   open  . COLONOSCOPY  2009   recommended 10 year f/u  . HERNIA REPAIR  1985   umb hernia rpr  . HERNIA REPAIR  2005 & 2009   ventral hernia rpr  . PILONIDAL CYST EXCISION  1972  . SKIN CANCER REMOVAL     . TONSILLECTOMY    . UPPER GI ENDOSCOPY    . VENTRAL HERNIA REPAIR      MEDICATIONS: . Ascorbic Acid (VITAMIN C) 500 MG CAPS  . aspirin 81 MG tablet  . Calcium Carbonate-Vit D-Min (CALCIUM 1200 PO)  . cetirizine (ZYRTEC) 10 MG tablet  . Cholecalciferol (VITAMIN D PO)  . gabapentin (NEURONTIN) 600 MG tablet  . levothyroxine (SYNTHROID) 50 MCG tablet  . OnabotulinumtoxinA (BOTOX IJ)  . pantoprazole (PROTONIX) 40 MG tablet  . pravastatin (PRAVACHOL) 40 MG tablet  . zinc gluconate 50 MG tablet   No current facility-administered medications  for this encounter.     Konrad Felix, PA-C [facility] [phone]

## 2020-01-30 ENCOUNTER — Other Ambulatory Visit: Payer: Self-pay | Admitting: Obstetrics and Gynecology

## 2020-01-30 DIAGNOSIS — N63 Unspecified lump in unspecified breast: Secondary | ICD-10-CM

## 2020-01-31 ENCOUNTER — Other Ambulatory Visit (HOSPITAL_COMMUNITY)
Admission: RE | Admit: 2020-01-31 | Discharge: 2020-01-31 | Disposition: A | Discharge status: Home / Self Care | Payer: Medicare PPO | Source: Ambulatory Visit | Attending: Surgery | Admitting: Surgery

## 2020-01-31 DIAGNOSIS — Z20822 Contact with and (suspected) exposure to covid-19: Secondary | ICD-10-CM | POA: Diagnosis not present

## 2020-01-31 DIAGNOSIS — Z01812 Encounter for preprocedural laboratory examination: Secondary | ICD-10-CM | POA: Insufficient documentation

## 2020-01-31 LAB — SARS CORONAVIRUS 2 (TAT 6-24 HRS): SARS Coronavirus 2: NEGATIVE

## 2020-02-02 DIAGNOSIS — D696 Thrombocytopenia, unspecified: Secondary | ICD-10-CM | POA: Diagnosis not present

## 2020-02-04 ENCOUNTER — Ambulatory Visit (HOSPITAL_COMMUNITY): Admission: RE | Admit: 2020-02-04 | Payer: Medicare PPO | Source: Home / Self Care | Admitting: Surgery

## 2020-02-04 ENCOUNTER — Encounter (HOSPITAL_COMMUNITY): Admission: RE | Payer: Self-pay | Source: Home / Self Care

## 2020-02-04 SURGERY — REPAIR, HERNIA, INGUINAL, ADULT
Anesthesia: General | Laterality: Left

## 2020-02-10 ENCOUNTER — Other Ambulatory Visit: Payer: Self-pay | Admitting: Internal Medicine

## 2020-02-10 DIAGNOSIS — K219 Gastro-esophageal reflux disease without esophagitis: Secondary | ICD-10-CM

## 2020-02-10 MED ORDER — PANTOPRAZOLE SODIUM 40 MG PO TBEC: DELAYED_RELEASE_TABLET | ORAL | 0 refills | Status: DC

## 2020-02-12 ENCOUNTER — Ambulatory Visit (INDEPENDENT_AMBULATORY_CARE_PROVIDER_SITE_OTHER): Payer: Medicare PPO

## 2020-02-12 ENCOUNTER — Other Ambulatory Visit: Payer: Self-pay

## 2020-02-12 VITALS — Temp 98.6°F

## 2020-02-12 DIAGNOSIS — Z23 Encounter for immunization: Secondary | ICD-10-CM

## 2020-02-23 DIAGNOSIS — Z01419 Encounter for gynecological examination (general) (routine) without abnormal findings: Secondary | ICD-10-CM | POA: Diagnosis not present

## 2020-02-25 ENCOUNTER — Other Ambulatory Visit: Payer: Self-pay

## 2020-02-25 ENCOUNTER — Ambulatory Visit: Payer: Medicare PPO | Admitting: Internal Medicine

## 2020-02-25 ENCOUNTER — Encounter: Payer: Self-pay | Admitting: Internal Medicine

## 2020-02-25 VITALS — BP 134/88 | HR 68 | Temp 97.2°F | Resp 16 | Ht 63.0 in | Wt 161.0 lb

## 2020-02-25 DIAGNOSIS — Z79899 Other long term (current) drug therapy: Secondary | ICD-10-CM | POA: Diagnosis not present

## 2020-02-25 DIAGNOSIS — Z961 Presence of intraocular lens: Secondary | ICD-10-CM | POA: Diagnosis not present

## 2020-02-25 DIAGNOSIS — H524 Presbyopia: Secondary | ICD-10-CM | POA: Diagnosis not present

## 2020-02-25 DIAGNOSIS — N1832 Chronic kidney disease, stage 3b: Secondary | ICD-10-CM | POA: Diagnosis not present

## 2020-02-25 DIAGNOSIS — I951 Orthostatic hypotension: Secondary | ICD-10-CM

## 2020-02-25 DIAGNOSIS — D696 Thrombocytopenia, unspecified: Secondary | ICD-10-CM

## 2020-02-25 DIAGNOSIS — R0989 Other specified symptoms and signs involving the circulatory and respiratory systems: Secondary | ICD-10-CM | POA: Diagnosis not present

## 2020-02-25 MED ORDER — OLMESARTAN MEDOXOMIL 20 MG PO TABS: ORAL_TABLET | ORAL | 0 refills | Status: DC

## 2020-02-25 NOTE — Progress Notes (Signed)
History of Present Illness:     Patient is a very nice 74 yo MWF with HTN, HLD, Pre-Diabetes, GERD , CKD3b who had elective LIH repair scheduled Oct 20 ->>> cancelled due to Low BP and Low Plt Ct had dropped from recent 83K down to  51K and Vitamin D Deficiency. List of recent home BP's (sitting ) have been relatively low ranging 90-105 / 50-70. She denies any HA's postural dizziness, presyncope, did have one episode of mild vertigo w/o  other focal neuro sx's.    Medications  .  levothyroxine  50 MCG tablet, Take 1 tablet daily   .  pravastatin  40 MG tablet, Take 1 tablet at Bedtime  .  cetirizine  10 MG tablet, Take daily.  [name] Kitchen  aspirin 81 MG tablet, Take  Daily.  [name] Kitchen  VITAMIN C 500 MG CAPS, Take  daily.   .  Calcium 1200 -Vit D-Min , Take  Daily.  [name] Kitchen  VITAMIN D , Take 5,000 Units daily.  [name] Kitchen  gabapentin  600 MG tablet, take 900 mg  in morning and 1200 mg at bedtime  .  Onabotulinum toxinA (BOTOX IJ), Inject 1 vial into the muscle every 8  weeks.   .  pantoprazole  40 MG tablet, Take 1 tablet  Daily   .  zinc  50 MG tablet, Take 50 mg by mouth daily.  Problem list She has Labile hypertension; Degenerative joint disease; GERD (gastroesophageal reflux disease); Hyperlipidemia; Hypothyroidism; Vitamin D deficiency; Other abnormal glucose (prediabetes); Medication management; Upper airway cough syndrome; CKD (chronic kidney disease) stage 3, GFR 30-59 ml/min ([place]); Overweight (BMI 25.0-29.9); Encounter for Medicare annual wellness exam; Thrombocytopenia ([place]); Mammogram abnormal; Inguinal hernia; and Migraine without status migrainosus, not intractable on their problem list.   Observations/Objective:  BP 134/88   P 68   T 97.2 F   R 16   Ht 5\' 3"    Wt 161 lb   SpO2 90%   BMI 28.52   Postural BP's - Sitting BP 182/107    P 70     & Standing BP   161/99     P 78   HEENT - WNL. Neck - supple.  Chest - Clear equal BS. Cor - Nl HS. RRR w/o sig MGR. PP 1(+).  Trace ankle  /pretibial  edema. MS- FROM w/o deformities.  Gait Nl. Neuro -  Nl w/o focal abnormalities.  Assessment and Plan:   1. Labile hypertension  - CBC with Differential/Platelet - COMPLETE METABOLIC PANEL WITH GFR - olmesartan 20 MG ; Take 1/2 to 1 tablet at Bedtime - Disp: 90 tablet;   2. Orthostatic hypotension  3. Stage 3b chronic kidney disease (HCC)  - COMPLETE METABOLIC PANEL WITH GFR  4. Thrombocytopenia (HCC)  - CBC with Differential/Platelet  5. Medication management  - CBC with Differential/Platelet - COMPLETE METABOLIC PANEL WITH GFR  Follow Up Instructions:       Advised monitor BID postural BPs & Pulses  - ROV - 2 weeks w/list or call sooner if questions.        I discussed the assessment and treatment plan with the patient  & husband. They were provided an opportunity to ask questions and all were answered. They  agreed with the plan and demonstrated an understanding of the instructions.       They were advised to call back or seek an in-person evaluation if the symptoms worsen or if the condition fails to improve as anticipated.  Kirtland Bouchard, MD

## 2020-02-26 ENCOUNTER — Other Ambulatory Visit: Payer: Self-pay | Admitting: Internal Medicine

## 2020-02-26 DIAGNOSIS — D519 Vitamin B12 deficiency anemia, unspecified: Secondary | ICD-10-CM

## 2020-02-26 DIAGNOSIS — D529 Folate deficiency anemia, unspecified: Secondary | ICD-10-CM

## 2020-02-26 DIAGNOSIS — D696 Thrombocytopenia, unspecified: Secondary | ICD-10-CM

## 2020-02-26 LAB — COMPLETE METABOLIC PANEL WITH GFR
AG Ratio: 1.8 (calc) (ref 1.0–2.5)
ALT: 15 U/L (ref 6–29)
AST: 18 U/L (ref 10–35)
Albumin: 4.2 g/dL (ref 3.6–5.1)
Alkaline phosphatase (APISO): 65 U/L (ref 37–153)
BUN/Creatinine Ratio: 20 (calc) (ref 6–22)
BUN: 26 mg/dL — ABNORMAL HIGH (ref 7–25)
CO2: 27 mmol/L (ref 20–32)
Calcium: 10 mg/dL (ref 8.6–10.4)
Chloride: 106 mmol/L (ref 98–110)
Creat: 1.28 mg/dL — ABNORMAL HIGH (ref 0.60–0.93)
GFR, Est African American: 47 mL/min/{1.73_m2} — ABNORMAL LOW (ref >=60)
GFR, Est Non African American: 41 mL/min/{1.73_m2} — ABNORMAL LOW (ref >=60)
Globulin: 2.3 g/dL (calc) (ref 1.9–3.7)
Glucose, Bld: 94 mg/dL (ref 65–99)
Potassium: 5.1 mmol/L (ref 3.5–5.3)
Sodium: 139 mmol/L (ref 135–146)
Total Bilirubin: 0.5 mg/dL (ref 0.2–1.2)
Total Protein: 6.5 g/dL (ref 6.1–8.1)

## 2020-02-26 LAB — CBC WITH DIFFERENTIAL/PLATELET
Absolute Monocytes: 637 cells/uL (ref 200–950)
Basophils Absolute: 49 cells/uL (ref 0–200)
Basophils Relative: 0.7 %
Eosinophils Absolute: 273 cells/uL (ref 15–500)
Eosinophils Relative: 3.9 %
HCT: 35.7 % (ref 35.0–45.0)
Hemoglobin: 12 g/dL (ref 11.7–15.5)
Lymphs Abs: 1813 cells/uL (ref 850–3900)
MCH: 30.2 pg (ref 27.0–33.0)
MCHC: 33.6 g/dL (ref 32.0–36.0)
MCV: 89.9 fL (ref 80.0–100.0)
MPV: 11.6 fL (ref 7.5–12.5)
Monocytes Relative: 9.1 %
Neutro Abs: 4228 cells/uL (ref 1500–7800)
Neutrophils Relative %: 60.4 %
Platelets: 67 10*3/uL — ABNORMAL LOW (ref 140–400)
RBC: 3.97 10*6/uL (ref 3.80–5.10)
RDW: 12.1 % (ref 11.0–15.0)
Total Lymphocyte: 25.9 %
WBC: 7 10*3/uL (ref 3.8–10.8)

## 2020-02-26 NOTE — Progress Notes (Signed)
========================================================== ==========================================================  -    CBC shows Red cell ct & WBC - both OK , But . . . . . . [name] Kitchen   - Platelet count is 67,000 - sl better  (was 51,000) , But . . . [name] Kitchen  - still low Normal is 140,000 - 400,000  - need to come in for more labs to check Vit B12 7 Folate levels (Lab Only)  ==========================================================  -  Also, will put in referral consultation to see a hematologist, before  scheduled surgery  ==========================================================  -  Kidney functions (GFR 41 - in Stage 3b and stable)  ==========================================================  -

## 2020-02-27 ENCOUNTER — Other Ambulatory Visit: Payer: Self-pay

## 2020-02-27 ENCOUNTER — Telehealth: Payer: Self-pay | Admitting: Hematology and Oncology

## 2020-02-27 ENCOUNTER — Other Ambulatory Visit: Payer: Medicare PPO

## 2020-02-27 DIAGNOSIS — D529 Folate deficiency anemia, unspecified: Secondary | ICD-10-CM

## 2020-02-27 DIAGNOSIS — D519 Vitamin B12 deficiency anemia, unspecified: Secondary | ICD-10-CM | POA: Diagnosis not present

## 2020-02-27 DIAGNOSIS — D696 Thrombocytopenia, unspecified: Secondary | ICD-10-CM | POA: Diagnosis not present

## 2020-02-27 NOTE — Telephone Encounter (Signed)
Received a new hem referral from Dr. Melford Aase for thrombocytopenia. Jillian Rodgers has been cld and scheduled to see Dr. Chryl Heck on 11/17 at 1pm. Pt aware to arrive 20 minutes early.

## 2020-03-01 LAB — FOLATE RBC: RBC Folate: 682 ng/mL RBC (ref >280)

## 2020-03-01 LAB — VITAMIN B12: Vitamin B-12: 559 pg/mL (ref 200–1100)

## 2020-03-01 NOTE — Progress Notes (Signed)
===================================================  -   Both Vitamin & B12 levels are Normal & OK  ===================================================

## 2020-03-02 NOTE — Progress Notes (Signed)
[facility] NOTE  Patient Care Team: Jillian Pinto, MD as PCP - General (Internal Medicine) Jillian Parish, [name]  CHIEF COMPLAINTS/PURPOSE OF CONSULTATION:  Thrombocytopenia  ASSESSMENT & PLAN:  No problem-specific Assessment & Plan notes found for this encounter.  No orders of the defined types were placed in this encounter. [name] with past medical history significant for hypertension, dyslipidemia, hypothyroidism who was referred for evaluation of thrombocytopenia.  Thrombocytopenia is defined as a platelet count below the lower limit of normal (ie, <150,000/microL [150 x 109/L] for adults).  Degrees of thrombocytopenia can be further subdivided into mild (platelet count 100,000 to 150,000/microL), moderate (50,000 to 99,000/microL), and severe (<50,000/microL) Most common causes of thrombocytopenia include but not limited to chronic liver disease or hypersplenism, immune thrombocytopenia, viral infections such as Hepatitis, HIV, active bacterial infections, autoimmune diseases, alcohol, nutritional deficiencies and medications. Rarely bone marrow disorders such as myelodysplatic syndrome, bone marrow failure syndromes, acute leukemia and PNH can present with thrombocytopenia. Additional rare causes of thrombocytopenia include vascular conditions associated with platelet destruction (eg, giant capillary hemangioma, large aortic aneurysms, cardiopulmonary bypass, intraaortic balloon pumps.  We have discussed about the common causes of thrombocytopenia today with the patient.  Given slowly progressive thrombocytopenia over several years and no symptoms, I am concerned about possible bone marrow disorder such as myelodysplastic syndrome versus chronic ITP.  I have ordered iron panel, ferritin, smear review to rule out pseudothrombocytopenia, hepatitis panel today.  If none of the  above-mentioned labs reveal an explanation for progressive thrombocytopenia, I do not think it is unreasonable to proceed with bone marrow biopsy.  I do not see a clear contraindication for surgery as long as platelet count is over 50,000.  Given family history of ovarian cancer, I also recommended to consider genetic testing.  She would like to think about this.  She will return to clinic in 4 weeks to review her lab results and to discuss any additional recommendations.  She understands need for bone marrow biopsy and she is agreeable to it if necessary.  Interval consulted on the care of this patient.  Please not hesitate to contact us with any additional questions or concerns.  HISTORY OF PRESENTING ILLNESS:  Jillian Rodgers 74 y.o. female is here because of thrombocytopenia.  74 year old female patient with past medical history significant for hypertension, hypothyroidism, dyslipidemia referred to hematology for evaluation of thrombocytopenia.  [name] arrived to the appointment with her husband.  She denies any new health complaints.  She was supposed to have an inguinal hernia surgery on the left and as a part of preop work-up had some blood work which demonstrated thrombocytopenia and hence she was referred to hematology.  She tells me that she had some minor procedures this year but was never told that she had thrombocytopenia.  She denies any history of bleeding otherwise.  She denies any B symptoms.  No history of autoimmune diseases, new medications, hepatitis C or alcohol intake or known liver disease. She denies any change in breathing, bowel habits, hematochezia, melena, hematuria etc.  She does report some mild fatigue but attributes to lack of physical activity during the pandemic.  Family history quite significant for maternal family with ovarian cancer in mom, breast cancer in sister who also had thyroid cancer.  There are some of her cousins on maternal side who had  breast cancer.  She denies any personal history of cancers. She  denies any known liver diseases or alcohol intake.  No herbal supplements.  Last Covid vaccination in October 2021. Review of systems otherwise negative except as mentioned above.  REVIEW OF SYSTEMS:   Constitutional: Denies fevers, chills or abnormal night sweats Eyes: Denies blurriness of vision, double vision or watery eyes Ears, nose, mouth, throat, and face: Denies mucositis or sore throat Respiratory: Denies cough, dyspnea or wheezes Cardiovascular: Denies palpitation, chest discomfort or lower extremity swelling Gastrointestinal:  Denies nausea, heartburn or change in bowel habits Skin: Denies abnormal skin rashes Lymphatics: Denies new lymphadenopathy or easy bruising Neurological:Denies numbness, tingling or new weaknesses Behavioral/Psych: Mood is stable, no new changes  All other systems were reviewed with the patient and are negative.  MEDICAL HISTORY:  Past Medical History:  Diagnosis Date  . Cancer (HCC)    HX OF SKIN CANCER   . Chronic kidney disease   . Degenerative joint disease   . GERD (gastroesophageal reflux disease)   . History of kidney stones   . Hyperlipidemia   . Hypertension   . Hypothyroidism   . Migraines    neurontin helps  . Pre-diabetes    LAST 2 TIMES NORMAL PER PT   . Thyroid disease     SURGICAL HISTORY: Past Surgical History:  Procedure Laterality Date  . BILATERAL CATARACT SURGERY     . [place]   open  . COLONOSCOPY  2009   recommended 10 year f/u  . HERNIA REPAIR  1985   umb hernia rpr  . HERNIA REPAIR  2005 & 2009   ventral hernia rpr  . PILONIDAL CYST EXCISION  1972  . SKIN CANCER REMOVAL     . TONSILLECTOMY    . UPPER GI ENDOSCOPY    . VENTRAL HERNIA REPAIR      SOCIAL HISTORY: Social History   Socioeconomic History  . Marital status: Married    Spouse name: Not on file  . Number of children: 2  . Years of  education: 70  . Highest education level: Not on file  Occupational History  . Not on file  Tobacco Use  . Smoking status: Never Smoker  . Smokeless tobacco: Never Used  Vaping Use  . Vaping Use: Never used  Substance and Sexual Activity  . Alcohol use: No  . Drug use: No  . Sexual activity: Not on file  Other Topics Concern  . Not on file  Social History Narrative  . Not on file   Social Determinants of Health   Financial Resource Strain:   . Difficulty of Paying Living Expenses: Not on file  Food Insecurity:   . Worried About Charity fundraiser in the Last Year: Not on file  . Ran Out of Food in the Last Year: Not on file  Transportation Needs:   . Lack of Transportation (Medical): Not on file  . Lack of Transportation (Non-Medical): Not on file  Physical Activity:   . Days of Exercise per Week: Not on file  . Minutes of Exercise per Session: Not on file  Stress:   . Feeling of Stress : Not on file  Social Connections:   . Frequency of Communication with Friends and Family: Not on file  . Frequency of Social Gatherings with Friends and Family: Not on file  . Attends Religious Services: Not on file  . Active Member of Clubs or Organizations: Not on file  . Attends Archivist Meetings: Not on  file  . Marital Status: Not on file  Intimate Partner Violence:   . Fear of Current or Ex-Partner: Not on file  . Emotionally Abused: Not on file  . Physically Abused: Not on file  . Sexually Abused: Not on file    FAMILY HISTORY: Family History  Problem Relation Age of Onset  . Ovarian cancer Mother   . Heart disease Mother   . Cancer Mother   . Uterine cancer Mother   . Hypertension Mother   . Breast cancer Sister   . Stroke Paternal Uncle   . Congestive Heart Failure Father   . Gout Maternal Aunt   . Colon cancer Neg Hx   . Esophageal cancer Neg Hx   . Stomach cancer Neg Hx   . Rectal cancer Neg Hx     ALLERGIES:  has No Known  Allergies.  MEDICATIONS:  Current Outpatient Medications  Medication Sig Dispense Refill  . Ascorbic Acid (VITAMIN C) 500 MG CAPS Take 500 mg by mouth daily.     [name] Kitchen aspirin 81 MG tablet Take 81 mg by mouth daily. (Patient not taking: Reported on 02/25/2020)    . Calcium Carbonate-Vit D-Min (CALCIUM 1200 PO) Take 1,200 Units by mouth daily.    . cetirizine (ZYRTEC) 10 MG tablet Take 10 mg by mouth daily.    . Cholecalciferol (VITAMIN D PO) Take 5,000 Units by mouth daily.    [name] Kitchen gabapentin (NEURONTIN) 600 MG tablet Take 900-1,200 mg by mouth See admin instructions. Take 900 mg by mouth in morning and 1200 mg at bedtime    . levothyroxine (SYNTHROID) 50 MCG tablet Take 1 tablet daily on an empty stomach with only water for 30 minutes & no Antacid meds, Calcium or Magnesium for 4 hours & avoid Biotin 90 tablet 0  . olmesartan (BENICAR) 20 MG tablet Take      1/2 to 1 tablet       at Bedtime       for elevated BP 90 tablet 0  . OnabotulinumtoxinA (BOTOX IJ) Inject 1 vial into the muscle every 8 (eight) weeks.     . pantoprazole (PROTONIX) 40 MG tablet Take      1 tablet       Daily       for Acid Indigestion & Heartburn 90 tablet 0  . pravastatin (PRAVACHOL) 40 MG tablet Take 1 tablet at Bedtime for Cholesterol (Patient taking differently: Take 40 mg by mouth at bedtime. Take 1 tablet at Bedtime for Cholesterol) 90 tablet 3  . zinc gluconate 50 MG tablet Take 50 mg by mouth daily.     No current facility-administered medications for this visit.     PHYSICAL EXAMINATION: ECOG PERFORMANCE STATUS: 0 - Asymptomatic  There were no vitals filed for this visit. There were no vitals filed for this visit.  GENERAL:alert, no distress and comfortable SKIN: skin color, texture, turgor are normal, no rashes or significant lesions EYES: normal, conjunctiva are pink and non-injected, sclera clear OROPHARYNX:no exudate, no erythema and lips, buccal mucosa, and tongue normal  NECK: supple, thyroid normal  size, non-tender, without nodularity LYMPH:  no palpable lymphadenopathy in the cervical, axillary or inguinal LUNGS: clear to auscultation and percussion with normal breathing effort HEART: regular rate & rhythm and no murmurs and no lower extremity edema ABDOMEN:abdomen soft, non-tender and normal bowel sounds Musculoskeletal:no cyanosis of digits and no clubbing  PSYCH: alert & oriented x 3 with fluent speech NEURO: no focal motor/sensory deficits  LABORATORY DATA:  I have reviewed the data as listed Lab Results  Component Value Date   WBC 7.0 02/25/2020   HGB 12.0 02/25/2020   HCT 35.7 02/25/2020   MCV 89.9 02/25/2020   PLT 67 (L) 02/25/2020     Chemistry      Component Value Date/Time   NA 139 02/25/2020 1649   K 5.1 02/25/2020 1649   CL 106 02/25/2020 1649   CO2 27 02/25/2020 1649   BUN 26 (H) 02/25/2020 1649   CREATININE 1.28 (H) 02/25/2020 1649      Component Value Date/Time   CALCIUM 10.0 02/25/2020 1649   ALKPHOS 71 02/15/2018 1442   AST 18 02/25/2020 1649   ALT 15 02/25/2020 1649   BILITOT 0.5 02/25/2020 1649     I have reviewed labs for the past 10 yrs. no thrombocytopenia in 2009.  There were no labs between 2009 and 2014.  Platelet count was 120,000 in 2014 and has progressively declined over the past several years. Slowly progressive thrombocytopenia over the past 5/6 yrs,  No leukopenia or anemia. MPV is normal. No basophilia.  RADIOGRAPHIC STUDIES: I have personally reviewed the radiological images as listed and agreed with the findings in the report. No results found.  All questions were answered. The patient knows to call the clinic with any problems, questions or concerns.  I spent 60 minutes in the care of this patient including history and physical, review of medical records for the past several years, discussion about causes of thrombocytopenia, treatment planning, of this more than 30 minutes for face-to-face counseling.Benay Pike,  MD 03/02/2020 12:37 PM

## 2020-03-03 ENCOUNTER — Inpatient Hospital Stay: Payer: Medicare PPO

## 2020-03-03 ENCOUNTER — Other Ambulatory Visit: Payer: Self-pay

## 2020-03-03 ENCOUNTER — Inpatient Hospital Stay: Payer: Medicare PPO | Attending: Hematology and Oncology | Admitting: Hematology and Oncology

## 2020-03-03 VITALS — BP 118/74 | HR 82 | Temp 98.0°F | Resp 18 | Ht 64.0 in | Wt 162.0 lb

## 2020-03-03 DIAGNOSIS — Z79899 Other long term (current) drug therapy: Secondary | ICD-10-CM | POA: Diagnosis not present

## 2020-03-03 DIAGNOSIS — Z808 Family history of malignant neoplasm of other organs or systems: Secondary | ICD-10-CM | POA: Diagnosis not present

## 2020-03-03 DIAGNOSIS — I10 Essential (primary) hypertension: Secondary | ICD-10-CM | POA: Insufficient documentation

## 2020-03-03 DIAGNOSIS — D696 Thrombocytopenia, unspecified: Secondary | ICD-10-CM

## 2020-03-03 DIAGNOSIS — E039 Hypothyroidism, unspecified: Secondary | ICD-10-CM | POA: Diagnosis not present

## 2020-03-03 DIAGNOSIS — G43719 Chronic migraine without aura, intractable, without status migrainosus: Secondary | ICD-10-CM | POA: Diagnosis not present

## 2020-03-03 DIAGNOSIS — Z803 Family history of malignant neoplasm of breast: Secondary | ICD-10-CM | POA: Diagnosis not present

## 2020-03-03 DIAGNOSIS — G518 Other disorders of facial nerve: Secondary | ICD-10-CM | POA: Diagnosis not present

## 2020-03-03 DIAGNOSIS — G43111 Migraine with aura, intractable, with status migrainosus: Secondary | ICD-10-CM | POA: Diagnosis not present

## 2020-03-03 DIAGNOSIS — M542 Cervicalgia: Secondary | ICD-10-CM | POA: Diagnosis not present

## 2020-03-03 DIAGNOSIS — Z8041 Family history of malignant neoplasm of ovary: Secondary | ICD-10-CM | POA: Diagnosis not present

## 2020-03-03 DIAGNOSIS — M791 Myalgia, unspecified site: Secondary | ICD-10-CM | POA: Diagnosis not present

## 2020-03-03 DIAGNOSIS — G43019 Migraine without aura, intractable, without status migrainosus: Secondary | ICD-10-CM | POA: Diagnosis not present

## 2020-03-03 LAB — CBC WITH DIFFERENTIAL/PLATELET
Abs Immature Granulocytes: 0.02 10*3/uL (ref 0.00–0.07)
Basophils Absolute: 0.1 10*3/uL (ref 0.0–0.1)
Basophils Relative: 1 %
Eosinophils Absolute: 0.2 10*3/uL (ref 0.0–0.5)
Eosinophils Relative: 3 %
HCT: 37.6 % (ref 36.0–46.0)
Hemoglobin: 11.9 g/dL — ABNORMAL LOW (ref 12.0–15.0)
Immature Granulocytes: 0 %
Lymphocytes Relative: 22 %
Lymphs Abs: 1.6 10*3/uL (ref 0.7–4.0)
MCH: 29.5 pg (ref 26.0–34.0)
MCHC: 31.6 g/dL (ref 30.0–36.0)
MCV: 93.1 fL (ref 80.0–100.0)
Monocytes Absolute: 0.7 10*3/uL (ref 0.1–1.0)
Monocytes Relative: 10 %
Neutro Abs: 4.7 10*3/uL (ref 1.7–7.7)
Neutrophils Relative %: 64 %
Platelets: 66 10*3/uL — ABNORMAL LOW (ref 150–400)
RBC: 4.04 MIL/uL (ref 3.87–5.11)
RDW: 12.4 % (ref 11.5–15.5)
WBC: 7.3 10*3/uL (ref 4.0–10.5)
nRBC: 0 % (ref 0.0–0.2)

## 2020-03-03 LAB — IRON AND TIBC
Iron: 76 ug/dL (ref 41–142)
Saturation Ratios: 20 % — ABNORMAL LOW (ref 21–57)
TIBC: 385 ug/dL (ref 236–444)
UIBC: 310 ug/dL (ref 120–384)

## 2020-03-03 LAB — HEPATITIS PANEL, ACUTE
HCV Ab: NONREACTIVE
Hep A IgM: NONREACTIVE
Hep B C IgM: NONREACTIVE
Hepatitis B Surface Ag: NONREACTIVE

## 2020-03-03 LAB — FERRITIN: Ferritin: 16 ng/mL (ref 11–307)

## 2020-03-03 LAB — TSH: TSH: 1.047 u[IU]/mL (ref 0.308–3.960)

## 2020-03-04 ENCOUNTER — Telehealth: Payer: Self-pay

## 2020-03-04 ENCOUNTER — Telehealth: Payer: Self-pay | Admitting: *Deleted

## 2020-03-04 LAB — PATHOLOGIST SMEAR REVIEW

## 2020-03-04 MED ORDER — FERROUS SULFATE 325 (65 FE) MG PO TBEC: 325.0000 mg | DELAYED_RELEASE_TABLET | Freq: Three times a day (TID) | ORAL | 3 refills | Status: DC

## 2020-03-04 NOTE — Telephone Encounter (Signed)
-----   Message from Benay Pike, MD sent at 03/03/2020  3:40 PM EST ----- Can we please call this patient and let her know that her iron is low and if she can start taking ferrous sulfate 325 mg po daily. Iron can cause black stools and constipation, if constipated can ok docusate 100 mg po daily PRN for constipation. Otherwise so far, low platelets and mild anemia.We will keep her posted.

## 2020-03-04 NOTE — Telephone Encounter (Signed)
TCT patient regarding lab results. Spoke with patient and advised that her iron levels are low. Dr. Chryl Heck would like her to start oral iron daily. This will be called into her pharmacy. Pt verbalized understanding Ferrous sulfate escribed to [facility] on [address], also, that her platelets remain low @ 66k.  Advised that Dr. Chryl Heck is planning on having a bone marrow biopsy done. Pt voiced understanding and will call to have that scheduled.[name] Kitchen

## 2020-03-04 NOTE — Telephone Encounter (Signed)
This LPN attempted to call pt to schedule BMBX per Dr Chryl Heck. Pt did not answer. LVM for pt to return call

## 2020-03-05 ENCOUNTER — Telehealth: Payer: Self-pay

## 2020-03-05 NOTE — Telephone Encounter (Signed)
This LPN attempted to call pt once again to schedule BMBX. Pt did not answer. LVM for pt to return call to [facility] and ask for Lindsey's nurse.

## 2020-03-07 NOTE — Progress Notes (Signed)
History of Present Illness:      Patient is a very nice 74 yo MWF with HTN, HLD, Pre-Diabetes, GERD, CKD3b  Seen 11 days ago after elective LIH repair was cancelled due to low bBPs and noted gradual thrombocytopenia. WRT the low plt ct pat had hematology consultation on 02/25/20 by Dr Arletha Pili Iruku who felt that surgery could proceed as long as plt cts > 50,000.  Patient is apparently scheduled for a BM bx on Dec 3rd.     The other issue as reported low BP's not found by exam on 02/25/2020, as infact BP's were moderately elevated at 182/107 and 161/99 and not significantly postural.  Patient was given Rx for Olmesartan 40 mg to start 1/2 tab qhs.  Patient returns today with a bid list or postural BP's & pulses.  BP's as recorded are mostly normal and some are hypotensive in the range of 70-80/50-60 . (postural BPs are not recorded).  Patient does admit occasional independently feeling light-headed.    Medications  .  levothyroxine  50 MCG tablet, Take 1 tablet daily  .  olmesartan  20 MG tablet, Take 1/2 to 1 tablet at Bedtime  .  pravastatin 40 MG tablet, Take 1 tablet at Bedtime  .  cetirizine 10 MG tablet, Take 10 mg  daily. .  ferrous sulfate 325 (65 FE) MG , Take 1 tablet  3 times daily with meals. [name] Kitchen  VITAMIN C 500 MG CAPS, Take 500 mg  daily.  .  Calcium Carbonate-Vit D-Min, Take 1,200 Units  daily. [name] Kitchen  VITAMIN D , Take 5,000 Unitsdaily. [name] Kitchen  gabapentin 600 MG tablet,   Take 900 mg in morning and 1200 mg at bedtime .  OnabotulinumtoxinA (BOTOX IJ), Inject 1 vial into the muscle every 8 ( weeks.  .  pantoprazole  40 MG tablet, Take      1 tablet       Dail .  zinc 50 MG tablet, Take 50 mg  daily.  Problem list She has Labile hypertension; Degenerative joint disease; GERD (gastroesophageal reflux disease); Hyperlipidemia; Hypothyroidism; Vitamin D deficiency; Other abnormal glucose (prediabetes); Medication management; Upper airway cough syndrome; CKD (chronic kidney disease) stage 3,  GFR 30-59 ml/min ([place]); Overweight (BMI 25.0-29.9); Encounter for Medicare annual wellness exam; Thrombocytopenia ([place]); Mammogram abnormal; Inguinal hernia; and Migraine without status migrainosus, not intractable on their problem list.   Observations/Objective:   BP 126/82   Pulse 70   Temp (!) 97.2 F (36.2 C)   Resp 16   Ht 5\' 3"  (1.6 m)   Wt 163 lb (73.9 kg)   SpO2 98%   BMI 28.87 kg/m   Postural       Sit BP   132/83   P  68          &       Stand  BP 112/74    P  76   HEENT - WNL. Neck - supple.  Chest - Clear equal BS. Cor - Nl HS. RRR w/o sig MGR. PP 1(+). No edema. MS- FROM w/o deformities.  Gait Nl. Neuro -  Nl w/o focal abnormalities.  Assessment and Plan:   1. Labile hypertension  - Advised stopping Olmesartan for now.  - Hush is amenable to purchase an [employer]ron arm cuff on [place].  - Advised monitoring BP & P bid and if  sys BP > 160 or dias BP > 95 to recheck and if sys BP low <100 to also check standing  BP & P  - and send list of measurements via "MyChart " in about 10 days   Follow Up Instructions:        I discussed the assessment and treatment plan with the patient. The patient was provided an opportunity to ask questions and all were answered. The patient agreed with the plan and demonstrated an understanding of the instructions.       The patient was advised to call back or seek an in-person evaluation if the symptoms worsen or if the condition fails to improve as anticipated.    Kirtland Bouchard, MD

## 2020-03-08 ENCOUNTER — Other Ambulatory Visit: Payer: Self-pay

## 2020-03-08 ENCOUNTER — Other Ambulatory Visit: Payer: Self-pay | Admitting: Adult Health

## 2020-03-08 ENCOUNTER — Ambulatory Visit: Payer: Medicare PPO | Admitting: Internal Medicine

## 2020-03-08 ENCOUNTER — Telehealth: Payer: Self-pay

## 2020-03-08 ENCOUNTER — Encounter: Payer: Self-pay | Admitting: Internal Medicine

## 2020-03-08 VITALS — BP 126/82 | HR 70 | Temp 97.2°F | Resp 16 | Ht 63.0 in | Wt 163.0 lb

## 2020-03-08 DIAGNOSIS — D696 Thrombocytopenia, unspecified: Secondary | ICD-10-CM

## 2020-03-08 DIAGNOSIS — R0989 Other specified symptoms and signs involving the circulatory and respiratory systems: Secondary | ICD-10-CM

## 2020-03-08 MED ORDER — LORAZEPAM 0.5 MG PO TABS: 0.5000 mg | ORAL_TABLET | Freq: Once | ORAL | 0 refills | Status: AC

## 2020-03-08 NOTE — Patient Instructions (Signed)

## 2020-03-08 NOTE — Telephone Encounter (Signed)
Please let patient know that I sent lorazepam into her pharmacy listed in [facility].  She must have a driver if she is taking the Lorazepam beforehand.    Thanks,  Mendel Ryder

## 2020-03-08 NOTE — Telephone Encounter (Signed)
This LPN was able to get in touch with pt today to schedule BMBX per Dr Chryl Heck for 03/19/20. Pt was extremely reluctant to schedule and was educated on procedure; verbalized understanding. Explained to pt she could have procedure done under sedation in IR or have it w/o sedation in [facility]. Pt chose to have procedure in [facility] but requests medication for anxiety prior to procedure. Message sent to scheduling to get pt scheduled and have Dr Rob Hickman schedule blocked until 0830 that morning to assist Wilber Bihari, NP with BMBX. Flowtometry is also aware.

## 2020-03-09 ENCOUNTER — Ambulatory Visit: Payer: Medicare PPO | Admitting: Internal Medicine

## 2020-03-09 ENCOUNTER — Ambulatory Visit
Admission: RE | Admit: 2020-03-09 | Discharge: 2020-03-09 | Disposition: A | Discharge status: Home / Self Care | Payer: Medicare PPO | Source: Ambulatory Visit | Attending: Obstetrics and Gynecology | Admitting: Obstetrics and Gynecology

## 2020-03-09 DIAGNOSIS — N6001 Solitary cyst of right breast: Secondary | ICD-10-CM | POA: Diagnosis not present

## 2020-03-09 DIAGNOSIS — R928 Other abnormal and inconclusive findings on diagnostic imaging of breast: Secondary | ICD-10-CM | POA: Diagnosis not present

## 2020-03-09 DIAGNOSIS — N63 Unspecified lump in unspecified breast: Secondary | ICD-10-CM

## 2020-03-10 ENCOUNTER — Telehealth: Payer: Self-pay | Admitting: Hematology and Oncology

## 2020-03-10 NOTE — Telephone Encounter (Signed)
Rescheduled per provider. Called and spoke with pt, confirmed 12/22 appt

## 2020-03-17 ENCOUNTER — Other Ambulatory Visit: Payer: Self-pay | Admitting: Obstetrics and Gynecology

## 2020-03-17 DIAGNOSIS — M8580 Other specified disorders of bone density and structure, unspecified site: Secondary | ICD-10-CM

## 2020-03-19 ENCOUNTER — Other Ambulatory Visit: Payer: Self-pay

## 2020-03-19 ENCOUNTER — Inpatient Hospital Stay: Payer: Medicare PPO

## 2020-03-19 ENCOUNTER — Inpatient Hospital Stay: Payer: Medicare PPO | Attending: Hematology and Oncology | Admitting: Hematology and Oncology

## 2020-03-19 VITALS — BP 186/94 | HR 66 | Temp 98.8°F | Resp 18

## 2020-03-19 DIAGNOSIS — D509 Iron deficiency anemia, unspecified: Secondary | ICD-10-CM | POA: Insufficient documentation

## 2020-03-19 DIAGNOSIS — Z8041 Family history of malignant neoplasm of ovary: Secondary | ICD-10-CM | POA: Diagnosis not present

## 2020-03-19 DIAGNOSIS — Z803 Family history of malignant neoplasm of breast: Secondary | ICD-10-CM | POA: Insufficient documentation

## 2020-03-19 DIAGNOSIS — Z808 Family history of malignant neoplasm of other organs or systems: Secondary | ICD-10-CM | POA: Diagnosis not present

## 2020-03-19 DIAGNOSIS — D696 Thrombocytopenia, unspecified: Secondary | ICD-10-CM | POA: Diagnosis not present

## 2020-03-19 DIAGNOSIS — Z79899 Other long term (current) drug therapy: Secondary | ICD-10-CM | POA: Insufficient documentation

## 2020-03-19 DIAGNOSIS — D6489 Other specified anemias: Secondary | ICD-10-CM | POA: Diagnosis not present

## 2020-03-19 LAB — CBC WITH DIFFERENTIAL (CANCER CENTER ONLY)
Abs Immature Granulocytes: 0.01 10*3/uL (ref 0.00–0.07)
Basophils Absolute: 0 10*3/uL (ref 0.0–0.1)
Basophils Relative: 1 %
Eosinophils Absolute: 0.2 10*3/uL (ref 0.0–0.5)
Eosinophils Relative: 3 %
HCT: 35.6 % — ABNORMAL LOW (ref 36.0–46.0)
Hemoglobin: 11.7 g/dL — ABNORMAL LOW (ref 12.0–15.0)
Immature Granulocytes: 0 %
Lymphocytes Relative: 22 %
Lymphs Abs: 1.4 10*3/uL (ref 0.7–4.0)
MCH: 29.6 pg (ref 26.0–34.0)
MCHC: 32.9 g/dL (ref 30.0–36.0)
MCV: 90.1 fL (ref 80.0–100.0)
Monocytes Absolute: 0.8 10*3/uL (ref 0.1–1.0)
Monocytes Relative: 12 %
Neutro Abs: 4 10*3/uL (ref 1.7–7.7)
Neutrophils Relative %: 62 %
Platelet Count: 59 10*3/uL — ABNORMAL LOW (ref 150–400)
RBC: 3.95 MIL/uL (ref 3.87–5.11)
RDW: 12.7 % (ref 11.5–15.5)
WBC Count: 6.4 10*3/uL (ref 4.0–10.5)
nRBC: 0 % (ref 0.0–0.2)

## 2020-03-19 LAB — CMP (CANCER CENTER ONLY)
ALT: 9 U/L (ref 0–44)
AST: 13 U/L — ABNORMAL LOW (ref 15–41)
Albumin: 3.5 g/dL (ref 3.5–5.0)
Alkaline Phosphatase: 68 U/L (ref 38–126)
Anion gap: 8 (ref 5–15)
BUN: 23 mg/dL (ref 8–23)
CO2: 26 mmol/L (ref 22–32)
Calcium: 10.1 mg/dL (ref 8.9–10.3)
Chloride: 108 mmol/L (ref 98–111)
Creatinine: 1.12 mg/dL — ABNORMAL HIGH (ref 0.44–1.00)
GFR, Estimated: 51 mL/min — ABNORMAL LOW (ref >60)
Glucose, Bld: 67 mg/dL — ABNORMAL LOW (ref 70–99)
Potassium: 4.3 mmol/L (ref 3.5–5.1)
Sodium: 142 mmol/L (ref 135–145)
Total Bilirubin: 0.7 mg/dL (ref 0.3–1.2)
Total Protein: 6.5 g/dL (ref 6.5–8.1)

## 2020-03-19 NOTE — Progress Notes (Signed)
INDICATION: thrombocytopenia  Brief examination was performed. ENT: adequate airway clearance Heart: regular rate and rhythm.No Murmurs Lungs: clear to auscultation, no wheezes, normal respiratory effort  Bone Marrow Biopsy and Aspiration Procedure Note   Informed consent was obtained and potential risks including bleeding, infection and pain were reviewed with the patient.  The patient's name, date of birth, identification, consent and allergies were verified prior to the start of procedure and time out was performed.  The left posterior iliac crest was chosen as the site of biopsy.  The skin was prepped with ChloraPrep.   14 cc of 2% lidocaine was used to provide local anaesthesia.   10 cc of bone marrow aspirate was obtained followed by 1cm biopsy.  Pressure was applied to the biopsy site and bandage was placed over the biopsy site. Patient was made to lie on the back for 30 mins prior to discharge.  The procedure was tolerated well. COMPLICATIONS: None BLOOD LOSS: none The patient was discharged home in stable condition with a 1 week follow up to review results.  Patient was provided with post bone marrow biopsy instructions and instructed to call if there was any bleeding or worsening pain.  Specimens sent for flow cytometry, cytogenetics and additional studies.  Signed Scot Dock, NP

## 2020-03-19 NOTE — Progress Notes (Signed)
Bone marrow biopsy sucessfully preformed by Wilber Bihari, NP.  Pt monitored 30 minutes post procedure, BP 181/107 and asymptomatic.  Pt states any procedure she undergoes, her blood pressure increases.  Pt states typically she is discharged home and will monitor her BP at home and it will resolve within a few hours.  Dr. Chryl Heck notified and orders received to continue to monitor pt for another 30 minutes and re check BP.  0930- BP 186/94 and pt asymptomatic.  RN notified Dr. Chryl Heck and orders received to discharge pt home and educate pt on hypertensive crisis s/s and to continue to monitor BP ever 30 minutes at home.  Pt verbalized understanding.  Pt also educated to go to the nearest emergency room if BP increases within the next 4 hours or pt becomes symptomatic with hypertensive crisis.  Pt verbalized understanding.   and pt discharged home with instructions and verbalized understanding.

## 2020-03-19 NOTE — Patient Instructions (Signed)
Bone Marrow Aspiration and Bone Marrow Biopsy, Adult, Care After This sheet gives you information about how to care for yourself after your procedure. Your health care provider may also give you more specific instructions. If you have problems or questions, contact your health care provider. What can I expect after the procedure? After the procedure, it is common to have:  Mild pain and tenderness.  Swelling.  Bruising. Follow these instructions at home: Puncture site care   Follow instructions from your health care provider about how to take care of the puncture site. Make sure you: ? Wash your hands with soap and water before and after you change your bandage (dressing). If soap and water are not available, use hand sanitizer. ? Change your dressing as told by your health care provider.  Check your puncture site every day for signs of infection. Check for: ? More redness, swelling, or pain. ? Fluid or blood. ? Warmth. ? Pus or a bad smell. Activity  Return to your normal activities as told by your health care provider. Ask your health care provider what activities are safe for you.  Do not lift anything that is heavier than 10 lb (4.5 kg), or the limit that you are told, until your health care provider says that it is safe.  Do not drive for 24 hours if you were given a sedative during your procedure. General instructions   Take over-the-counter and prescription medicines only as told by your health care provider.  Do not take baths, swim, or use a hot tub until your health care provider approves. Ask your health care provider if you may take showers. You may only be allowed to take sponge baths.  If directed, put ice on the affected area. To do this: ? Put ice in a plastic bag. ? Place a towel between your skin and the bag. ? Leave the ice on for 20 minutes, 2-3 times a day.  Keep all follow-up visits as told by your health care provider. This is important. Contact a  health care provider if:  Your pain is not controlled with medicine.  You have a fever.  You have more redness, swelling, or pain around the puncture site.  You have fluid or blood coming from the puncture site.  Your puncture site feels warm to the touch.  You have pus or a bad smell coming from the puncture site. Summary  After the procedure, it is common to have mild pain, tenderness, swelling, and bruising.  Follow instructions from your health care provider about how to take care of the puncture site and what activities are safe for you.  Take over-the-counter and prescription medicines only as told by your health care provider.  Contact a health care provider if you have any signs of infection, such as fluid or blood coming from the puncture site. This information is not intended to replace advice given to you by your health care provider. Make sure you discuss any questions you have with your health care provider. Document Revised: 08/20/2018 Document Reviewed: 08/20/2018 Elsevier Patient Education  2020 [employer]  

## 2020-03-22 LAB — SURGICAL PATHOLOGY

## 2020-03-25 ENCOUNTER — Telehealth: Payer: Self-pay | Admitting: *Deleted

## 2020-03-25 NOTE — Telephone Encounter (Signed)
Patient called to question Bone Marrow Biopsy results.  She states she can see them via Mychart but is unsure how to interpret them.  She does have a follow up scheduled on 04/07/2020 but does not wish to wait that long to find out what they mean and what the next steps will be.  Routed to Dr. Chryl Heck to advise how to proceed.

## 2020-03-25 NOTE — Telephone Encounter (Signed)
Patient stated that she took the Iron supplement that was ordered for her however she only took it once a day  In morning with a meal vs the instructions 3 times a day and developed severe abdominal pains plus diarrhea therefore she stopped taking the supplement.  Patient wanted to know if there was any other medications she could take that wouldn't have those side effects.  Routed to [place]

## 2020-03-29 ENCOUNTER — Encounter (HOSPITAL_COMMUNITY): Payer: Self-pay

## 2020-03-29 NOTE — Telephone Encounter (Signed)
Attempted to reach patient to advise ok to stop taking her Iron supplement.  Left message pending call back.

## 2020-04-01 ENCOUNTER — Ambulatory Visit: Payer: Self-pay | Admitting: Surgery

## 2020-04-01 ENCOUNTER — Ambulatory Visit: Payer: Medicare PPO | Admitting: Hematology and Oncology

## 2020-04-07 ENCOUNTER — Inpatient Hospital Stay: Payer: Medicare PPO | Admitting: Hematology and Oncology

## 2020-04-07 ENCOUNTER — Encounter: Payer: Self-pay | Admitting: Hematology and Oncology

## 2020-04-07 ENCOUNTER — Other Ambulatory Visit: Payer: Self-pay

## 2020-04-07 DIAGNOSIS — D696 Thrombocytopenia, unspecified: Secondary | ICD-10-CM | POA: Diagnosis not present

## 2020-04-07 DIAGNOSIS — Z803 Family history of malignant neoplasm of breast: Secondary | ICD-10-CM | POA: Diagnosis not present

## 2020-04-07 DIAGNOSIS — D509 Iron deficiency anemia, unspecified: Secondary | ICD-10-CM | POA: Diagnosis not present

## 2020-04-07 DIAGNOSIS — Z8041 Family history of malignant neoplasm of ovary: Secondary | ICD-10-CM | POA: Diagnosis not present

## 2020-04-07 DIAGNOSIS — Z79899 Other long term (current) drug therapy: Secondary | ICD-10-CM | POA: Diagnosis not present

## 2020-04-07 DIAGNOSIS — Z808 Family history of malignant neoplasm of other organs or systems: Secondary | ICD-10-CM | POA: Diagnosis not present

## 2020-04-07 NOTE — Progress Notes (Signed)
[name]  Patient Care Team: Unk Pinto, [name], [name]  CHIEF COMPLAINTS/PURPOSE OF CONSULTATION:  Thrombocytopenia  ASSESSMENT & PLAN:  No problem-specific Assessment & Plan notes found for this encounter.  No orders of the defined types were placed in this encounter.  [name] with past medical history significant for hypertension, dyslipidemia, hypothyroidism who was referred for evaluation of thrombocytopenia.  Thrombocytopenia is defined as a platelet count below the lower limit of normal (ie, <150,000/microL [150 x 109/L] for adults).  Degrees of thrombocytopenia can be further subdivided into mild (platelet count 100,000 to 150,000/microL), moderate (50,000 to 99,000/microL), and severe (<50,000/microL) Most common causes of thrombocytopenia include but not limited to chronic liver disease or hypersplenism, immune thrombocytopenia, viral infections such as Hepatitis, HIV, active bacterial infections, autoimmune diseases, alcohol, nutritional deficiencies and medications. Rarely bone marrow disorders such as myelodysplatic syndrome, bone marrow failure syndromes, acute leukemia and PNH can present with thrombocytopenia. Additional rare causes of thrombocytopenia include vascular conditions associated with platelet destruction (eg, giant capillary hemangioma, large aortic aneurysms, cardiopulmonary bypass, intraaortic balloon pumps.  We have discussed about the common causes of thrombocytopenia today with the patient.  Given slowly progressive thrombocytopenia over several years and no symptoms, I am concerned about possible bone marrow disorder such as myelodysplastic syndrome versus chronic ITP She had lab evaluation as well as BMB. BMB showed hypercellular bone marrow for age with trilineage hematopoesis. Several small  lymphoid aggregates present.Flow cytometric  analysis failed to show any monoclonal B-cell population or aberrant  T-cell phenotype and hence a reactive lymphoid process is favored. Iron  stores are markedly decreased/absent and hence correlation with serum  iron studies is recommended.   She is here to FU and discuss about the BMB. I discussed the above mentioned results and offered to follow up on the platelet count with no immediate intervention. I discussed that the bone marrow is hypercellular for her age, this doesn't mean anything at this time, but if she has significant cytopenias or hematologic changes, we may have to re evaluate With regards to IDA, she tried oral iron, she felt terrible with explosive diarrhea, stomach upset, doesn't want to try oral iron. She is agreeable to IV iron, Discussed about feraheme formulary and the very small risk of anaphylaxis. She understands and would like to proceed with this. Given family history of ovarian cancer, I also recommended to consider genetic testing.  She would like to think about this.  HISTORY OF PRESENTING ILLNESS:  Jillian Rodgers 74 y.o. female is here because of thrombocytopenia.  History from initial visit  74 year old female patient with past medical history significant for hypertension, hypothyroidism, dyslipidemia referred to hematology for evaluation of thrombocytopenia.  [name] arrived to the appointment with her husband.  She denies any new health complaints.  She was supposed to have an inguinal hernia surgery on the left and as a part of preop work-up had some blood work which demonstrated thrombocytopenia and hence she was referred to hematology.  She tells me that she had some minor procedures this year but was never told that she had thrombocytopenia.  She denies any history of bleeding otherwise.  She denies any B symptoms.  No history of autoimmune diseases, new medications, hepatitis C or alcohol intake or  known liver disease. She denies any change in breathing, bowel habits, hematochezia, melena, hematuria etc.  She does report some mild fatigue but attributes to lack of physical activity during the pandemic.  Family history quite significant for maternal family with ovarian cancer in mom, breast cancer in sister who also had thyroid cancer.  There are some of her cousins on maternal side who had breast cancer.  She denies any personal history of cancers. She denies any known liver diseases or alcohol intake.  No herbal supplements.  Last Covid vaccination in October 2021.   Interim History  She had labs and BMB and is here for FU. She is doing well, she is just worried about the results of the BMB. No bleeding issues since last visit. Hernia surgery anticipated on Jan 14 th. Some toe nails that she wanted me to take a look at. She also mentions occasional wheezing and some SOB with exertion.  REVIEW OF SYSTEMS:    Constitutional: Denies fevers, chills or abnormal night sweats Eyes: Denies blurriness of vision, double vision or watery eyes Ears, nose, mouth, throat, and face: Denies mucositis or sore throat Respiratory: Denies cough,  Cardiovascular: Denies palpitation, chest discomfort or lower extremity swelling Gastrointestinal:  Denies nausea, heartburn or change in bowel habits Skin: Denies abnormal skin rashes Lymphatics: Denies new lymphadenopathy or easy bruising Neurological:Denies numbness, tingling or new weaknesses Behavioral/Psych: Mood is stable, no new changes  All other systems were reviewed with the patient and are negative.  MEDICAL HISTORY:  Past Medical History:  Diagnosis Date  . Cancer (HCC)    HX OF SKIN CANCER   . Chronic kidney disease   . Degenerative joint disease   . GERD (gastroesophageal reflux disease)   . History of kidney stones   . Hyperlipidemia   . Hypertension   . Hypothyroidism   . Migraines    neurontin helps  . Pre-diabetes    LAST 2  TIMES NORMAL PER PT   . Thyroid disease     SURGICAL HISTORY: Past Surgical History:  Procedure Laterality Date  . BILATERAL CATARACT SURGERY     . BLERPHOROPLASTY _0    . CHOLECYSTECTOMY  1996   open  . COLONOSCOPY  2009   recommended 10 year f/u  . HERNIA REPAIR  1985   umb hernia rpr  . HERNIA REPAIR  2005 & 2009   ventral hernia rpr  . PILONIDAL CYST EXCISION  1972  . SKIN CANCER REMOVAL     . TONSILLECTOMY    . UPPER GI ENDOSCOPY    . VENTRAL HERNIA REPAIR      SOCIAL HISTORY: Social History   Socioeconomic History  . Marital status: Married    Spouse name: Not on file  . Number of children: 2  . Years of education: 63  . Highest education level: Not on file  Occupational History  . Not on file  Tobacco Use  . Smoking status: Never Smoker  . Smokeless tobacco: Never Used  Vaping Use  . Vaping Use: Never used  Substance and Sexual Activity  . Alcohol use: No  . Drug use: No  . Sexual activity: Not on file  Other Topics Concern  . Not on file  Social History Narrative  . Not on file   Social Determinants of Health   Financial Resource Strain: Not on file  Food Insecurity: Not on file  Transportation Needs: Not on file  Physical Activity: Not on file  Stress: Not on file  Social Connections: Not on file  Intimate Partner Violence: Not on file    FAMILY  HISTORY: Family History  Problem Relation Age of Onset  . Ovarian cancer Mother   . Heart disease Mother   . Cancer Mother   . Uterine cancer Mother   . Hypertension Mother   . Breast cancer Sister   . Stroke Paternal Uncle   . Congestive Heart Failure Father   . Gout Maternal Aunt   . Colon cancer Neg Hx   . Esophageal cancer Neg Hx   . Stomach cancer Neg Hx   . Rectal cancer Neg Hx     ALLERGIES:  has No Known Allergies.  MEDICATIONS:  Current Outpatient Medications  Medication Sig Dispense Refill  . Ascorbic Acid (VITAMIN C) 500 MG CAPS Take 500 mg by mouth daily.     . Calcium  Carbonate-Vit D-Min (CALCIUM 1200 PO) Take 1,200 Units by mouth daily.    . cetirizine (ZYRTEC) 10 MG tablet Take 10 mg by mouth daily.    . Cholecalciferol (VITAMIN D PO) Take 5,000 Units by mouth daily.    [name] Kitchen gabapentin (NEURONTIN) 600 MG tablet Take 900-1,200 mg by mouth See admin instructions. Take 900 mg by mouth in morning and 1200 mg at bedtime    . levothyroxine (SYNTHROID) 50 MCG tablet Take 1 tablet daily on an empty stomach with only water for 30 minutes & no Antacid meds, Calcium or Magnesium for 4 hours & avoid Biotin 90 tablet 0  . olmesartan (BENICAR) 20 MG tablet Take      1/2 to 1 tablet       at Bedtime       for elevated BP 90 tablet 0  . OnabotulinumtoxinA (BOTOX IJ) Inject 1 vial into the muscle every 8 (eight) weeks.     . pantoprazole (PROTONIX) 40 MG tablet Take      1 tablet       Daily       for Acid Indigestion & Heartburn 90 tablet 0  . pravastatin (PRAVACHOL) 40 MG tablet Take 1 tablet at Bedtime for Cholesterol (Patient taking differently: Take 40 mg by mouth at bedtime. Take 1 tablet at Bedtime for Cholesterol) 90 tablet 3  . zinc gluconate 50 MG tablet Take 50 mg by mouth daily.     No current facility-administered medications for this visit.     PHYSICAL EXAMINATION: ECOG PERFORMANCE STATUS: 0 - Asymptomatic  Vitals:   04/07/20 0956  BP: (!) 146/96  Pulse: 67  Resp: 18  Temp: 98.4 F (36.9 C)  SpO2: 100%   Filed Weights   04/07/20 0956  Weight: 165 lb 4.8 oz (75 kg)    GENERAL:alert, no distress and comfortable SKIN: skin color, texture, turgor are normal, no rashes or significant lesions EYES: normal, conjunctiva are pink and non-injected, sclera clear OROPHARYNX:no exudate, no erythema and lips, buccal mucosa, and tongue normal  NECK: supple, thyroid normal size, non-tender, without nodularity LYMPH:  no palpable lymphadenopathy in the cervical, axillary or inguinal LUNGS: clear to auscultation and percussion with normal breathing effort HEART:  regular rate & rhythm and no murmurs and no lower extremity edema ABDOMEN:abdomen soft, non-tender and normal bowel sounds Musculoskeletal:no cyanosis of digits and no clubbing  PSYCH: alert & oriented x 3 with fluent speech NEURO: no focal motor/sensory deficits Nails: scab over both great toe nails, no concern for an infection.  LABORATORY DATA:  I have reviewed the data as listed Lab Results  Component Value Date   WBC 6.4 03/19/2020   HGB 11.7 (L) 03/19/2020   HCT 35.6 (L)  03/19/2020   MCV 90.1 03/19/2020   PLT 59 (L) 03/19/2020     Chemistry      Component Value Date/Time   NA 142 03/19/2020 0835   K 4.3 03/19/2020 0835   CL 108 03/19/2020 0835   CO2 26 03/19/2020 0835   BUN 23 03/19/2020 0835   CREATININE 1.12 (H) 03/19/2020 0835   CREATININE 1.28 (H) 02/25/2020 1649      Component Value Date/Time   CALCIUM 10.1 03/19/2020 0835   ALKPHOS 68 03/19/2020 0835   AST 13 (L) 03/19/2020 0835   ALT 9 03/19/2020 0835   BILITOT 0.7 03/19/2020 0835     I have reviewed labs for the past 10 yrs. no thrombocytopenia in 2009.  There were no labs between 2009 and 2014.  Platelet count was 120,000 in 2014 and has progressively declined over the past several years. Slowly progressive thrombocytopenia over the past 5/6 yrs,  No leukopenia or anemia. MPV is normal. No basophilia.  RADIOGRAPHIC STUDIES: I have personally reviewed the radiological images as listed and agreed with the findings in the report. US BREAST LTD UNI RIGHT INC AXILLA  Result Date: 03/09/2020 CLINICAL DATA:  Patient for follow-up of probably benign right breast mass. EXAM: DIGITAL DIAGNOSTIC BILATERAL MAMMOGRAM WITH CAD AND TOMO ULTRASOUND RIGHT BREAST COMPARISON:  Previous exam(s). ACR Breast Density Category b: There are scattered areas of fibroglandular density. FINDINGS: Redemonstrated lobular mass within the lower inner right breast anterior depth. No new masses, calcifications or nonsurgical distortion  identified within either breast. Mammographic images were processed with CAD. Targeted ultrasound is performed, showing a 7 x 3 x 4 mm cyst right breast 4 o'clock position 1 cm from the nipple. While this is mildly increased in size when compared to prior exam, it appears as a simple cyst on ultrasound. IMPRESSION: No mammographic evidence for malignancy.  Benign right breast mass. RECOMMENDATION: Screening mammogram in one year.(Code:SM-B-01Y) I have discussed the findings and recommendations with the patient. If applicable, a reminder letter will be sent to the patient regarding the next appointment. BI-RADS CATEGORY  2: Benign. Electronically Signed   By: Lovey Newcomer M.D.   On: 03/09/2020 11:47   MM DIAG BREAST TOMO BILATERAL  Result Date: 03/09/2020 CLINICAL DATA:  Patient for follow-up of probably benign right breast mass. EXAM: DIGITAL DIAGNOSTIC BILATERAL MAMMOGRAM WITH CAD AND TOMO ULTRASOUND RIGHT BREAST COMPARISON:  Previous exam(s). ACR Breast Density Category b: There are scattered areas of fibroglandular density. FINDINGS: Redemonstrated lobular mass within the lower inner right breast anterior depth. No new masses, calcifications or nonsurgical distortion identified within either breast. Mammographic images were processed with CAD. Targeted ultrasound is performed, showing a 7 x 3 x 4 mm cyst right breast 4 o'clock position 1 cm from the nipple. While this is mildly increased in size when compared to prior exam, it appears as a simple cyst on ultrasound. IMPRESSION: No mammographic evidence for malignancy.  Benign right breast mass. RECOMMENDATION: Screening mammogram in one year.(Code:SM-B-01Y) I have discussed the findings and recommendations with the patient. If applicable, a reminder letter will be sent to the patient regarding the next appointment. BI-RADS CATEGORY  2: Benign. Electronically Signed   By: Lovey Newcomer M.D.   On: 03/09/2020 11:47   I have reviewed bone marrow biopsy  reports. All questions were answered. The patient knows to call the clinic with any problems, questions or concerns.  I spent 30 minutes in the care of this patient including history and physical, discussion  about BMB results, management of IDA,  treatment planning, of this more than 30 minutes for face-to-face counseling.Benay Pike, MD 04/07/2020 10:56 AM

## 2020-04-08 ENCOUNTER — Telehealth: Payer: Self-pay | Admitting: Hematology and Oncology

## 2020-04-08 NOTE — Telephone Encounter (Signed)
Scheduled appointments per 12/22 los. Called patient, no answer. Left message with appointments dates and times.  

## 2020-04-15 ENCOUNTER — Other Ambulatory Visit: Payer: Self-pay | Admitting: Internal Medicine

## 2020-04-15 ENCOUNTER — Telehealth: Payer: Self-pay | Admitting: *Deleted

## 2020-04-15 MED ORDER — PROMETHAZINE-DM 6.25-15 MG/5ML PO SYRP: ORAL_SOLUTION | ORAL | 1 refills | Status: DC

## 2020-04-15 MED ORDER — AZITHROMYCIN 250 MG PO TABS: ORAL_TABLET | ORAL | 0 refills | Status: DC

## 2020-04-15 NOTE — Telephone Encounter (Signed)
Returned call to patient regarding BP readings. Per Dr Oneta Rack, the readings had high and low readings, but overall OK. Dr Oneta Rack suggested she remain on same medication, check blood pressures sitting and standing and report the readings. If the readings show a drop in pressure when standing, Dr Oneta Rack may need to change her medications. Patient is aware.. Reading ranged from 92/54 on 03/15/2020 to 158/95 on 03/30/2020.

## 2020-04-17 DIAGNOSIS — I4891 Unspecified atrial fibrillation: Principal | ICD-10-CM

## 2020-04-17 HISTORY — DX: Unspecified atrial fibrillation: I48.91

## 2020-04-19 ENCOUNTER — Encounter (HOSPITAL_COMMUNITY): Payer: Medicare PPO

## 2020-04-19 ENCOUNTER — Inpatient Hospital Stay: Payer: Medicare PPO | Attending: Hematology and Oncology

## 2020-04-19 ENCOUNTER — Other Ambulatory Visit: Payer: Self-pay

## 2020-04-19 VITALS — BP 146/97 | HR 63 | Temp 98.8°F | Resp 18

## 2020-04-19 DIAGNOSIS — D509 Iron deficiency anemia, unspecified: Secondary | ICD-10-CM | POA: Insufficient documentation

## 2020-04-19 MED ORDER — SODIUM CHLORIDE 0.9 % IV SOLN
Freq: Once | INTRAVENOUS | Status: AC
  Filled 2020-04-19: qty 250

## 2020-04-19 MED ORDER — SODIUM CHLORIDE 0.9 % IV SOLN
510.0000 mg | Freq: Once | INTRAVENOUS | Status: AC
  Administered 2020-04-19: 510 mg via INTRAVENOUS
  Filled 2020-04-19: qty 510

## 2020-04-19 MED ORDER — DIPHENHYDRAMINE HCL 25 MG PO CAPS
ORAL_CAPSULE | ORAL | Status: AC
  Filled 2020-04-19: qty 2

## 2020-04-19 MED ORDER — DIPHENHYDRAMINE HCL 25 MG PO CAPS
50.0000 mg | ORAL_CAPSULE | Freq: Once | ORAL | Status: AC
  Administered 2020-04-19: 50 mg via ORAL

## 2020-04-19 MED ORDER — ACETAMINOPHEN 325 MG PO TABS
ORAL_TABLET | ORAL | Status: AC
  Filled 2020-04-19: qty 2

## 2020-04-19 MED ORDER — ACETAMINOPHEN 325 MG PO TABS
650.0000 mg | ORAL_TABLET | Freq: Once | ORAL | Status: AC
  Administered 2020-04-19: 650 mg via ORAL

## 2020-04-19 NOTE — Patient Instructions (Signed)
Iron Dextran injection What is this medicine? IRON DEXTRAN (AHY ern DEX tran) is an iron complex. Iron is used to make healthy red blood cells, which carry oxygen and nutrients through the body. This medicine is used to treat people who cannot take iron by mouth and have low levels of iron in the blood. This medicine may be used for other purposes; ask your health care provider or pharmacist if you have questions. COMMON BRAND NAME(S): Dexferrum, INFeD What should I tell my health care provider before I take this medicine? They need to know if you have any of these conditions:  anemia not caused by low iron levels  heart disease  high levels of iron in the blood  kidney disease  liver disease  an unusual or allergic reaction to iron, other medicines, foods, dyes, or preservatives  pregnant or trying to get pregnant  breast-feeding How should I use this medicine? This medicine is for injection into a vein or a muscle. It is given by a health care professional in a hospital or clinic setting. Talk to your pediatrician regarding the use of this medicine in children. While this drug may be prescribed for children as young as 4 months old for selected conditions, precautions do apply. Overdosage: If you think you have taken too much of this medicine contact a poison control center or emergency room at once. NOTE: This medicine is only for you. Do not share this medicine with others. What if I miss a dose? It is important not to miss your dose. Call your doctor or health care professional if you are unable to keep an appointment. What may interact with this medicine? Do not take this medicine with any of the following medications:  deferoxamine  dimercaprol  other iron products This medicine may also interact with the following medications:  chloramphenicol  deferasirox This list may not describe all possible interactions. Give your health care provider a list of all the  medicines, herbs, non-prescription drugs, or dietary supplements you use. Also tell them if you smoke, drink alcohol, or use illegal drugs. Some items may interact with your medicine. What should I watch for while using this medicine? Visit your doctor or health care professional regularly. Tell your doctor if your symptoms do not start to get better or if they get worse. You may need blood work done while you are taking this medicine. You may need to follow a special diet. Talk to your doctor. Foods that contain iron include: whole grains/cereals, dried fruits, beans, or peas, leafy green vegetables, and organ meats (liver, kidney). Long-term use of this medicine may increase your risk of some cancers. Talk to your doctor about how to limit your risk. What side effects may I notice from receiving this medicine? Side effects that you should report to your doctor or health care professional as soon as possible:  allergic reactions like skin rash, itching or hives, swelling of the face, lips, or tongue  blue lips, nails, or skin  breathing problems  changes in blood pressure  chest pain  confusion  fast, irregular heartbeat  feeling faint or lightheaded, falls  fever or chills  flushing, sweating, or hot feelings  joint or muscle aches or pains  pain, tingling, numbness in the hands or feet  seizures  unusually weak or tired Side effects that usually do not require medical attention (report to your doctor or health care professional if they continue or are bothersome):  change in taste (metallic taste)    diarrhea  headache  irritation at site where injected  nausea, vomiting  stomach upset This list may not describe all possible side effects. Call your doctor for medical advice about side effects. You may report side effects to FDA at 1-800-FDA-1088. Where should I keep my medicine? This drug is given in a hospital or clinic and will not be stored at home. NOTE: This  sheet is a summary. It may not cover all possible information. If you have questions about this medicine, talk to your doctor, pharmacist, or health care provider.  2020 Elsevier/Gold Standard (2007-08-20 16:59:50)  

## 2020-04-20 NOTE — Progress Notes (Signed)
DUE TO COVID-19 ONLY ONE VISITOR IS ALLOWED TO COME WITH YOU AND STAY IN THE WAITING ROOM ONLY DURING PRE OP AND PROCEDURE DAY OF SURGERY. THE 1 VISITOR  MAY VISIT WITH YOU AFTER SURGERY IN YOUR PRIVATE ROOM DURING VISITING HOURS ONLY!  YOU NEED TO HAVE A COVID 19 TEST ON_1/02/2021 ______ @_______ , THIS TEST MUST BE DONE BEFORE SURGERY,  COVID TESTING SITE [address] [place] , IT IS ON THE RIGHT GOING OUT [address] APPROXIMATELY  2 MINUTES PAST [employer] ON THE RIGHT. ONCE YOUR COVID TEST IS COMPLETED,  PLEASE BEGIN THE QUARANTINE INSTRUCTIONS AS OUTLINED IN YOUR HANDOUT.                Jillian Rodgers Jillian Rodgers  04/20/2020   Your procedure is scheduled on: 04/30/2020    Report to [facility] Main  Entrance   Report to admitting at 0530   AM     Call this number if you have problems the morning of surgery [phone]    Remember: Do not eat food , candy gum or mints :After Midnight. You may have clear liquids from midnight until 0430    CLEAR LIQUID DIET   Foods Allowed                                                                       Coffee and tea, regular and decaf                              Plain Jell-O any favor except red or purple                                            Fruit ices (not with fruit pulp)                                      Iced Popsicles                                     Carbonated beverages, regular and diet                                    Cranberry, grape and apple juices Sports drinks like Gatorade Lightly seasoned clear broth or consume(fat free) Sugar, honey syrup   _____________________________________________________________________    BRUSH YOUR TEETH MORNING OF SURGERY AND RINSE YOUR MOUTH OUT, NO CHEWING GUM CANDY OR MINTS.     Take these medicines the morning of surgery with A SIP OF WATER: ZYRTEC, GABAPENTIN, SYNTHROID, PROTONIX   DO NOT TAKE ANY DIABETIC MEDICATIONS DAY OF  YOUR SURGERY                               You may not have any metal  on your body including hair pins and              piercings  Do not wear jewelry, make-up, lotions, powders or perfumes, deodorant             Do not wear nail polish on your fingernails.  Do not shave  48 hours prior to surgery.              Men may shave face and neck.   Do not bring valuables to the hospital. [place].  Contacts, dentures or bridgework may not be worn into surgery.  Leave suitcase in the car. After surgery it may be brought to your room.     Patients discharged the day of surgery will not be allowed to drive home. IF YOU ARE HAVING SURGERY AND GOING HOME THE SAME DAY, YOU MUST HAVE AN ADULT TO DRIVE YOU HOME AND BE WITH YOU FOR 24 HOURS. YOU MAY GO HOME BY TAXI OR UBER OR ORTHERWISE, BUT AN ADULT MUST ACCOMPANY YOU HOME AND STAY WITH YOU FOR 24 HOURS.  Name and phone number of your driver:  Special Instructions: N/A              Please read over the following fact sheets you were given: _____________________________________________________________________  [facility] - Preparing for Surgery Before surgery, you can play an important role.  Because skin is not sterile, your skin needs to be as free of germs as possible.  You can reduce the number of germs on your skin by washing with CHG (chlorahexidine gluconate) soap before surgery.  CHG is an antiseptic cleaner which kills germs and bonds with the skin to continue killing germs even after washing. Please DO NOT use if you have an allergy to CHG or antibacterial soaps.  If your skin becomes reddened/irritated stop using the CHG and inform your nurse when you arrive at Short Stay. Do not shave (including legs and underarms) for at least 48 hours prior to the first CHG shower.  You may shave your face/neck. Please follow these instructions carefully:  1.  Shower with CHG Soap the night before surgery and  the  morning of Surgery.  2.  If you choose to wash your hair, wash your hair first as usual with your  normal  shampoo.  3.  After you shampoo, rinse your hair and body thoroughly to remove the  shampoo.                           4.  Use CHG as you would any other liquid soap.  You can apply chg directly  to the skin and wash                       Gently with a scrungie or clean washcloth.  5.  Apply the CHG Soap to your body ONLY FROM THE NECK DOWN.   Do not use on face/ open                           Wound or open sores. Avoid contact with eyes, ears mouth and genitals (private parts).                       Wash face,  Genitals (private parts) with your normal soap.             6.  Wash thoroughly, paying special attention to the area where your surgery  will be performed.  7.  Thoroughly rinse your body with warm water from the neck down.  8.  DO NOT shower/wash with your normal soap after using and rinsing off  the CHG Soap.                9.  Pat yourself dry with a clean towel.            10.  Wear clean pajamas.            11.  Place clean sheets on your bed the night of your first shower and do not  sleep with pets. Day of Surgery : Do not apply any lotions/deodorants the morning of surgery.  Please wear clean clothes to the hospital/surgery center.  FAILURE TO FOLLOW THESE INSTRUCTIONS MAY RESULT IN THE CANCELLATION OF YOUR SURGERY PATIENT SIGNATURE_________________________________  NURSE SIGNATURE__________________________________  ________________________________________________________________________

## 2020-04-21 ENCOUNTER — Encounter (HOSPITAL_COMMUNITY): Payer: Self-pay

## 2020-04-21 ENCOUNTER — Other Ambulatory Visit: Payer: Self-pay

## 2020-04-21 ENCOUNTER — Encounter (HOSPITAL_COMMUNITY)
Admission: RE | Admit: 2020-04-21 | Discharge: 2020-04-21 | Disposition: A | Discharge status: Home / Self Care | Payer: Medicare PPO | Source: Ambulatory Visit | Attending: Surgery | Admitting: Surgery

## 2020-04-21 DIAGNOSIS — Z01818 Encounter for other preprocedural examination: Secondary | ICD-10-CM | POA: Diagnosis not present

## 2020-04-21 HISTORY — DX: Dyspnea, unspecified: R06.00

## 2020-04-21 HISTORY — DX: Thrombocytopenia, unspecified: D69.6

## 2020-04-21 HISTORY — DX: Anemia, unspecified: D64.9

## 2020-04-21 HISTORY — DX: Pneumonia, unspecified organism: J18.9

## 2020-04-21 LAB — CBC WITH DIFFERENTIAL/PLATELET
Abs Immature Granulocytes: 0.05 10*3/uL (ref 0.00–0.07)
Basophils Absolute: 0.1 10*3/uL (ref 0.0–0.1)
Basophils Relative: 1 %
Eosinophils Absolute: 0.2 10*3/uL (ref 0.0–0.5)
Eosinophils Relative: 3 %
HCT: 39.1 % (ref 36.0–46.0)
Hemoglobin: 12.7 g/dL (ref 12.0–15.0)
Immature Granulocytes: 1 %
Lymphocytes Relative: 26 %
Lymphs Abs: 1.9 10*3/uL (ref 0.7–4.0)
MCH: 30 pg (ref 26.0–34.0)
MCHC: 32.5 g/dL (ref 30.0–36.0)
MCV: 92.4 fL (ref 80.0–100.0)
Monocytes Absolute: 0.8 10*3/uL (ref 0.1–1.0)
Monocytes Relative: 11 %
Neutro Abs: 4.2 10*3/uL (ref 1.7–7.7)
Neutrophils Relative %: 58 %
Platelets: 88 10*3/uL — ABNORMAL LOW (ref 150–400)
RBC: 4.23 MIL/uL (ref 3.87–5.11)
RDW: 12.7 % (ref 11.5–15.5)
WBC: 7.3 10*3/uL (ref 4.0–10.5)
nRBC: 0 % (ref 0.0–0.2)

## 2020-04-21 LAB — BASIC METABOLIC PANEL
Anion gap: 8 (ref 5–15)
BUN: 25 mg/dL — ABNORMAL HIGH (ref 8–23)
CO2: 27 mmol/L (ref 22–32)
Calcium: 10.1 mg/dL (ref 8.9–10.3)
Chloride: 104 mmol/L (ref 98–111)
Creatinine, Ser: 1.36 mg/dL — ABNORMAL HIGH (ref 0.44–1.00)
GFR, Estimated: 40 mL/min — ABNORMAL LOW (ref >60)
Glucose, Bld: 93 mg/dL (ref 70–99)
Potassium: 4.6 mmol/L (ref 3.5–5.1)
Sodium: 139 mmol/L (ref 135–145)

## 2020-04-21 NOTE — Progress Notes (Addendum)
Anesthesia Review:  PCP: DR Oneta Rack  Cardiologist : Chest x-ray : EKG :07/01/2019  Echo : Stress test: Cardiac Cath :  Activity level: can do a flight of stairs without difficulty  Sleep Study/ CPAP : no  Fasting Blood Sugar :      / Checks Blood Sugar -- times a day:   Blood Thinner/ Instructions /Last Dose: ASA / Instructions/ Last Dose :  Pt has low platelet count.  Received iron infuson at [facility] on 04/20/2019.  To receive another iron infusion on 04/27/2019 per pt.   Pt stated developed runny nose and cough on 04/11/2020.  No fever or other symptoims.  Covid home test was negative.  Has hx of bronchitis.  [place] office of DR Oneta Rack ( PCP) and she was prescribed z pack and cough medicine.  Finished antibiotics on 04/20/2019 per pt.  Pt states she still has cough off and on.  Denies any other symptoms.   BMP routed to DR Fredricka Bonine.  Done 04/21/20.   CBC done 04/21/20 routed to Dr Fredricka Bonine.

## 2020-04-23 DIAGNOSIS — G43019 Migraine without aura, intractable, without status migrainosus: Secondary | ICD-10-CM | POA: Diagnosis not present

## 2020-04-23 DIAGNOSIS — G43111 Migraine with aura, intractable, with status migrainosus: Secondary | ICD-10-CM | POA: Diagnosis not present

## 2020-04-23 DIAGNOSIS — G518 Other disorders of facial nerve: Secondary | ICD-10-CM | POA: Diagnosis not present

## 2020-04-23 DIAGNOSIS — G43719 Chronic migraine without aura, intractable, without status migrainosus: Secondary | ICD-10-CM | POA: Diagnosis not present

## 2020-04-23 DIAGNOSIS — M542 Cervicalgia: Secondary | ICD-10-CM | POA: Diagnosis not present

## 2020-04-23 DIAGNOSIS — M791 Myalgia, unspecified site: Secondary | ICD-10-CM | POA: Diagnosis not present

## 2020-04-26 ENCOUNTER — Other Ambulatory Visit: Payer: Self-pay

## 2020-04-26 ENCOUNTER — Inpatient Hospital Stay: Payer: Medicare PPO

## 2020-04-26 VITALS — BP 106/80 | HR 72 | Temp 97.9°F | Resp 18

## 2020-04-26 DIAGNOSIS — D509 Iron deficiency anemia, unspecified: Secondary | ICD-10-CM | POA: Diagnosis not present

## 2020-04-26 MED ORDER — SODIUM CHLORIDE 0.9 % IV SOLN
Freq: Once | INTRAVENOUS | Status: AC
  Filled 2020-04-26: qty 250

## 2020-04-26 MED ORDER — ACETAMINOPHEN 325 MG PO TABS
ORAL_TABLET | ORAL | Status: AC
  Filled 2020-04-26: qty 2

## 2020-04-26 MED ORDER — DIPHENHYDRAMINE HCL 25 MG PO CAPS
ORAL_CAPSULE | ORAL | Status: AC
  Filled 2020-04-26: qty 1

## 2020-04-26 MED ORDER — DIPHENHYDRAMINE HCL 25 MG PO CAPS
50.0000 mg | ORAL_CAPSULE | Freq: Once | ORAL | Status: AC
  Administered 2020-04-26: 50 mg via ORAL

## 2020-04-26 MED ORDER — ACETAMINOPHEN 325 MG PO TABS
650.0000 mg | ORAL_TABLET | Freq: Once | ORAL | Status: AC
  Administered 2020-04-26: 650 mg via ORAL

## 2020-04-26 MED ORDER — SODIUM CHLORIDE 0.9 % IV SOLN
510.0000 mg | Freq: Once | INTRAVENOUS | Status: AC
  Administered 2020-04-26: 510 mg via INTRAVENOUS
  Filled 2020-04-26: qty 510

## 2020-04-26 NOTE — Patient Instructions (Signed)
Ferumoxytol injection What is this medicine? FERUMOXYTOL is an iron complex. Iron is used to make healthy red blood cells, which carry oxygen and nutrients throughout the body. This medicine is used to treat iron deficiency anemia. This medicine may be used for other purposes; ask your health care provider or pharmacist if you have questions. COMMON BRAND NAME(S): Feraheme What should I tell my health care provider before I take this medicine? They need to know if you have any of these conditions:  anemia not caused by low iron levels  high levels of iron in the blood  magnetic resonance imaging (MRI) test scheduled  an unusual or allergic reaction to iron, other medicines, foods, dyes, or preservatives  pregnant or trying to get pregnant  breast-feeding How should I use this medicine? This medicine is for injection into a vein. It is given by a health care professional in a hospital or clinic setting. Talk to your pediatrician regarding the use of this medicine in children. Special care may be needed. Overdosage: If you think you have taken too much of this medicine contact a poison control center or emergency room at once. NOTE: This medicine is only for you. Do not share this medicine with others. What if I miss a dose? It is important not to miss your dose. Call your doctor or health care professional if you are unable to keep an appointment. What may interact with this medicine? This medicine may interact with the following medications:  other iron products This list may not describe all possible interactions. Give your health care provider a list of all the medicines, herbs, non-prescription drugs, or dietary supplements you use. Also tell them if you smoke, drink alcohol, or use illegal drugs. Some items may interact with your medicine. What should I watch for while using this medicine? Visit your doctor or healthcare professional regularly. Tell your doctor or healthcare  professional if your symptoms do not start to get better or if they get worse. You may need blood work done while you are taking this medicine. You may need to follow a special diet. Talk to your doctor. Foods that contain iron include: whole grains/cereals, dried fruits, beans, or peas, leafy green vegetables, and organ meats (liver, kidney). What side effects may I notice from receiving this medicine? Side effects that you should report to your doctor or health care professional as soon as possible:  allergic reactions like skin rash, itching or hives, swelling of the face, lips, or tongue  breathing problems  changes in blood pressure  feeling faint or lightheaded, falls  fever or chills  flushing, sweating, or hot feelings  swelling of the ankles or feet Side effects that usually do not require medical attention (report to your doctor or health care professional if they continue or are bothersome):  diarrhea  headache  nausea, vomiting  stomach pain This list may not describe all possible side effects. Call your doctor for medical advice about side effects. You may report side effects to FDA at 1-800-FDA-1088. Where should I keep my medicine? This drug is given in a hospital or clinic and will not be stored at home. NOTE: This sheet is a summary. It may not cover all possible information. If you have questions about this medicine, talk to your doctor, pharmacist, or health care provider.  2021 Elsevier/Gold Standard (2016-05-22 20:21:10)  

## 2020-04-26 NOTE — Progress Notes (Signed)
Pt remained for 30 min post infusion for observation, VS stable, tolerated well.

## 2020-04-27 ENCOUNTER — Other Ambulatory Visit (HOSPITAL_COMMUNITY)
Admission: RE | Admit: 2020-04-27 | Discharge: 2020-04-27 | Disposition: A | Discharge status: Home / Self Care | Payer: Medicare PPO | Source: Ambulatory Visit | Attending: Surgery | Admitting: Surgery

## 2020-04-27 DIAGNOSIS — Z01812 Encounter for preprocedural laboratory examination: Secondary | ICD-10-CM | POA: Insufficient documentation

## 2020-04-27 DIAGNOSIS — Z20822 Contact with and (suspected) exposure to covid-19: Secondary | ICD-10-CM | POA: Insufficient documentation

## 2020-04-27 LAB — SARS CORONAVIRUS 2 (TAT 6-24 HRS): SARS Coronavirus 2: NEGATIVE

## 2020-04-29 MED ORDER — BUPIVACAINE LIPOSOME 1.3 % IJ SUSP
20.0000 mL | INTRAMUSCULAR | Status: DC
  Filled 2020-04-29: qty 20

## 2020-04-29 NOTE — Anesthesia Preprocedure Evaluation (Addendum)
Anesthesia Evaluation  Patient identified by MRN, date of birth, ID band Patient awake    Reviewed: Allergy & Precautions, NPO status , Patient's Chart, lab work & pertinent test results  Airway Mallampati: II  TM Distance: >3 FB Neck ROM: Full    Dental no notable dental hx. (+) Teeth Intact, Dental Advisory Given   Pulmonary neg pulmonary ROS,    Pulmonary exam normal breath sounds clear to auscultation       Cardiovascular hypertension, Normal cardiovascular exam Rhythm:Regular Rate:Normal     Neuro/Psych  Headaches, negative psych ROS   GI/Hepatic Neg liver ROS, GERD  ,  Endo/Other  Hypothyroidism   Renal/GU K+ 4.6 Cr 1.36     Musculoskeletal  (+) Arthritis ,   Abdominal   Peds  Hematology  (+) anemia , Lab Results      Component                Value               Date                      WBC                      7.3                 04/21/2020                HGB                      12.7                04/21/2020                HCT                      39.1                04/21/2020                MCV                      92.4                04/21/2020                PLT                      88 (L)              04/21/2020              Anesthesia Other Findings   Reproductive/Obstetrics                            Anesthesia Physical Anesthesia Plan  ASA: II  Anesthesia Plan: General   Post-op Pain Management:  Regional for Post-op pain   Induction: Intravenous  PONV Risk Score and Plan: 4 or greater and Treatment may vary due to age or medical condition and Ondansetron  Airway Management Planned: Oral ETT  Additional Equipment: None  Intra-op Plan:   Post-operative Plan: Extubation in OR  Informed Consent: I have reviewed the patients History and Physical, chart, labs and discussed the procedure including the risks, benefits and alternatives for the proposed anesthesia  with the patient or authorized representative  who has indicated his/her understanding and acceptance.     Dental advisory given  Plan Discussed with: CRNA and Anesthesiologist  Anesthesia Plan Comments: (GA W L TAP Block)       Anesthesia Quick Evaluation

## 2020-04-30 ENCOUNTER — Ambulatory Visit (HOSPITAL_COMMUNITY): Payer: Medicare PPO | Admitting: Physician Assistant

## 2020-04-30 ENCOUNTER — Ambulatory Visit (HOSPITAL_COMMUNITY)
Admission: RE | Admit: 2020-04-30 | Discharge: 2020-04-30 | Disposition: A | Discharge status: Home / Self Care | Payer: Medicare PPO | Attending: Surgery | Admitting: Surgery

## 2020-04-30 ENCOUNTER — Encounter (HOSPITAL_COMMUNITY): Payer: Self-pay | Admitting: Surgery

## 2020-04-30 ENCOUNTER — Ambulatory Visit (HOSPITAL_COMMUNITY): Payer: Medicare PPO | Admitting: Certified Registered Nurse Anesthetist

## 2020-04-30 ENCOUNTER — Encounter (HOSPITAL_COMMUNITY)
Admission: RE | Disposition: A | Discharge status: Home / Self Care | Payer: Self-pay | Source: Home / Self Care | Attending: Surgery

## 2020-04-30 DIAGNOSIS — K219 Gastro-esophageal reflux disease without esophagitis: Secondary | ICD-10-CM | POA: Diagnosis not present

## 2020-04-30 DIAGNOSIS — Z79899 Other long term (current) drug therapy: Secondary | ICD-10-CM | POA: Insufficient documentation

## 2020-04-30 DIAGNOSIS — I10 Essential (primary) hypertension: Secondary | ICD-10-CM | POA: Diagnosis not present

## 2020-04-30 DIAGNOSIS — E039 Hypothyroidism, unspecified: Secondary | ICD-10-CM | POA: Diagnosis not present

## 2020-04-30 DIAGNOSIS — Z79890 Hormone replacement therapy (postmenopausal): Secondary | ICD-10-CM | POA: Insufficient documentation

## 2020-04-30 DIAGNOSIS — Z7982 Long term (current) use of aspirin: Secondary | ICD-10-CM | POA: Insufficient documentation

## 2020-04-30 DIAGNOSIS — K4090 Unilateral inguinal hernia, without obstruction or gangrene, not specified as recurrent: Secondary | ICD-10-CM | POA: Insufficient documentation

## 2020-04-30 DIAGNOSIS — E785 Hyperlipidemia, unspecified: Secondary | ICD-10-CM | POA: Diagnosis not present

## 2020-04-30 DIAGNOSIS — D696 Thrombocytopenia, unspecified: Secondary | ICD-10-CM | POA: Diagnosis not present

## 2020-04-30 DIAGNOSIS — E559 Vitamin D deficiency, unspecified: Secondary | ICD-10-CM | POA: Diagnosis not present

## 2020-04-30 HISTORY — PX: INGUINAL HERNIA REPAIR: SHX194

## 2020-04-30 SURGERY — REPAIR, HERNIA, INGUINAL, ADULT
Anesthesia: General | Site: Groin | Laterality: Left

## 2020-04-30 MED ORDER — DEXAMETHASONE SODIUM PHOSPHATE 10 MG/ML IJ SOLN
INTRAMUSCULAR | Status: AC
  Filled 2020-04-30: qty 1

## 2020-04-30 MED ORDER — CHLORHEXIDINE GLUCONATE 4 % EX LIQD: 60.0000 mL | Freq: Once | CUTANEOUS | Status: DC

## 2020-04-30 MED ORDER — SODIUM CHLORIDE 0.9 % IV SOLN: 250.0000 mL | INTRAVENOUS | Status: DC | PRN

## 2020-04-30 MED ORDER — FENTANYL CITRATE (PF) 100 MCG/2ML IJ SOLN
25.0000 ug | INTRAMUSCULAR | Status: DC | PRN
  Administered 2020-04-30: 50 ug via INTRAVENOUS

## 2020-04-30 MED ORDER — MEPERIDINE HCL 50 MG/ML IJ SOLN
INTRAMUSCULAR | Status: AC
  Filled 2020-04-30: qty 1

## 2020-04-30 MED ORDER — ROCURONIUM BROMIDE 10 MG/ML (PF) SYRINGE
PREFILLED_SYRINGE | INTRAVENOUS | Status: AC
  Filled 2020-04-30: qty 10

## 2020-04-30 MED ORDER — HYDRALAZINE HCL 20 MG/ML IJ SOLN
10.0000 mg | INTRAMUSCULAR | Status: DC | PRN
  Administered 2020-04-30: 10 mg via INTRAVENOUS

## 2020-04-30 MED ORDER — PROPOFOL 10 MG/ML IV BOLUS
INTRAVENOUS | Status: AC
  Filled 2020-04-30: qty 20

## 2020-04-30 MED ORDER — CHLORHEXIDINE GLUCONATE 0.12 % MT SOLN: 15.0000 mL | Freq: Once | OROMUCOSAL | Status: AC

## 2020-04-30 MED ORDER — SUGAMMADEX SODIUM 200 MG/2ML IV SOLN
INTRAVENOUS | Status: DC | PRN
  Administered 2020-04-30: 200 mg via INTRAVENOUS

## 2020-04-30 MED ORDER — BUPIVACAINE LIPOSOME 1.3 % IJ SUSP: 20.0000 mL | Freq: Once | INTRAMUSCULAR | Status: DC

## 2020-04-30 MED ORDER — EPHEDRINE 5 MG/ML INJ
INTRAVENOUS | Status: AC
  Filled 2020-04-30: qty 10

## 2020-04-30 MED ORDER — BUPIVACAINE-EPINEPHRINE (PF) 0.25% -1:200000 IJ SOLN
INTRAMUSCULAR | Status: AC
  Filled 2020-04-30: qty 30

## 2020-04-30 MED ORDER — ORAL CARE MOUTH RINSE
15.0000 mL | Freq: Once | OROMUCOSAL | Status: AC
  Administered 2020-04-30: 15 mL via OROMUCOSAL

## 2020-04-30 MED ORDER — 0.9 % SODIUM CHLORIDE (POUR BTL) OPTIME
TOPICAL | Status: DC | PRN
Start: 2020-04-30 — End: 2020-04-30
  Administered 2020-04-30: 1000 mL

## 2020-04-30 MED ORDER — DOCUSATE SODIUM 100 MG PO CAPS: 100.0000 mg | ORAL_CAPSULE | Freq: Two times a day (BID) | ORAL | 0 refills | Status: AC

## 2020-04-30 MED ORDER — OXYCODONE HCL 5 MG PO TABS: 5.0000 mg | ORAL_TABLET | Freq: Three times a day (TID) | ORAL | 0 refills | Status: DC | PRN

## 2020-04-30 MED ORDER — LIDOCAINE HCL (CARDIAC) PF 100 MG/5ML IV SOSY
PREFILLED_SYRINGE | INTRAVENOUS | Status: DC | PRN
  Administered 2020-04-30: 80 mg via INTRAVENOUS

## 2020-04-30 MED ORDER — SODIUM CHLORIDE 0.9% FLUSH: 3.0000 mL | Freq: Two times a day (BID) | INTRAVENOUS | Status: DC

## 2020-04-30 MED ORDER — BUPIVACAINE LIPOSOME 1.3 % IJ SUSP
INTRAMUSCULAR | Status: DC | PRN
  Administered 2020-04-30: 20 mL

## 2020-04-30 MED ORDER — CEFAZOLIN SODIUM-DEXTROSE 2-4 GM/100ML-% IV SOLN
2.0000 g | INTRAVENOUS | Status: AC
  Administered 2020-04-30: 2 g via INTRAVENOUS
  Filled 2020-04-30: qty 100

## 2020-04-30 MED ORDER — HYDRALAZINE HCL 20 MG/ML IJ SOLN
INTRAMUSCULAR | Status: AC
  Filled 2020-04-30: qty 1

## 2020-04-30 MED ORDER — SODIUM CHLORIDE 0.9% FLUSH: 3.0000 mL | INTRAVENOUS | Status: DC | PRN

## 2020-04-30 MED ORDER — ROCURONIUM BROMIDE 10 MG/ML (PF) SYRINGE
PREFILLED_SYRINGE | INTRAVENOUS | Status: DC | PRN
  Administered 2020-04-30: 50 mg via INTRAVENOUS

## 2020-04-30 MED ORDER — HYDRALAZINE HCL 20 MG/ML IJ SOLN: 10.0000 mg | INTRAMUSCULAR | Status: DC | PRN

## 2020-04-30 MED ORDER — MEPERIDINE HCL 50 MG/ML IJ SOLN
6.2500 mg | INTRAMUSCULAR | Status: DC | PRN
  Administered 2020-04-30: 6.25 mg via INTRAVENOUS

## 2020-04-30 MED ORDER — FENTANYL CITRATE (PF) 250 MCG/5ML IJ SOLN
INTRAMUSCULAR | Status: DC | PRN
  Administered 2020-04-30 (×4): 50 ug via INTRAVENOUS

## 2020-04-30 MED ORDER — KETOROLAC TROMETHAMINE 15 MG/ML IJ SOLN: 15.0000 mg | Freq: Once | INTRAMUSCULAR | Status: AC

## 2020-04-30 MED ORDER — BUPIVACAINE-EPINEPHRINE 0.25% -1:200000 IJ SOLN
INTRAMUSCULAR | Status: DC | PRN
  Administered 2020-04-30: 30 mL

## 2020-04-30 MED ORDER — ONDANSETRON HCL 4 MG/2ML IJ SOLN
INTRAMUSCULAR | Status: DC | PRN
  Administered 2020-04-30: 4 mg via INTRAVENOUS

## 2020-04-30 MED ORDER — FENTANYL CITRATE (PF) 250 MCG/5ML IJ SOLN
INTRAMUSCULAR | Status: AC
  Filled 2020-04-30: qty 5

## 2020-04-30 MED ORDER — FENTANYL CITRATE (PF) 100 MCG/2ML IJ SOLN
25.0000 ug | INTRAMUSCULAR | Status: DC | PRN
  Administered 2020-04-30: 25 ug via INTRAVENOUS

## 2020-04-30 MED ORDER — OXYCODONE HCL 5 MG PO TABS: 5.0000 mg | ORAL_TABLET | ORAL | Status: DC | PRN

## 2020-04-30 MED ORDER — KETOROLAC TROMETHAMINE 15 MG/ML IJ SOLN
INTRAMUSCULAR | Status: AC
  Administered 2020-04-30: 15 mg via INTRAVENOUS
  Filled 2020-04-30: qty 1

## 2020-04-30 MED ORDER — ONDANSETRON HCL 4 MG/2ML IJ SOLN: 4.0000 mg | Freq: Once | INTRAMUSCULAR | Status: DC | PRN

## 2020-04-30 MED ORDER — ACETAMINOPHEN 10 MG/ML IV SOLN: 1000.0000 mg | Freq: Once | INTRAVENOUS | Status: DC | PRN

## 2020-04-30 MED ORDER — FENTANYL CITRATE (PF) 100 MCG/2ML IJ SOLN
INTRAMUSCULAR | Status: AC
  Filled 2020-04-30: qty 2

## 2020-04-30 MED ORDER — LACTATED RINGERS IV SOLN: INTRAVENOUS | Status: DC

## 2020-04-30 MED ORDER — EPHEDRINE SULFATE-NACL 50-0.9 MG/10ML-% IV SOSY
PREFILLED_SYRINGE | INTRAVENOUS | Status: DC | PRN
  Administered 2020-04-30: 5 mg via INTRAVENOUS

## 2020-04-30 MED ORDER — ACETAMINOPHEN 325 MG PO TABS: 650.0000 mg | ORAL_TABLET | ORAL | Status: DC | PRN

## 2020-04-30 MED ORDER — ONDANSETRON HCL 4 MG/2ML IJ SOLN
INTRAMUSCULAR | Status: AC
  Filled 2020-04-30: qty 2

## 2020-04-30 MED ORDER — DEXAMETHASONE SODIUM PHOSPHATE 10 MG/ML IJ SOLN
INTRAMUSCULAR | Status: DC | PRN
  Administered 2020-04-30: 5 mg via INTRAVENOUS

## 2020-04-30 MED ORDER — PROPOFOL 10 MG/ML IV BOLUS
INTRAVENOUS | Status: DC | PRN
  Administered 2020-04-30: 140 mg via INTRAVENOUS

## 2020-04-30 MED ORDER — ACETAMINOPHEN 500 MG PO TABS
1000.0000 mg | ORAL_TABLET | ORAL | Status: AC
  Administered 2020-04-30: 1000 mg via ORAL
  Filled 2020-04-30: qty 2

## 2020-04-30 MED ORDER — ACETAMINOPHEN 650 MG RE SUPP
650.0000 mg | RECTAL | Status: DC | PRN
  Filled 2020-04-30: qty 1

## 2020-04-30 MED ORDER — LIDOCAINE HCL (PF) 2 % IJ SOLN
INTRAMUSCULAR | Status: AC
  Filled 2020-04-30: qty 5

## 2020-04-30 SURGICAL SUPPLY — 44 items
ADH SKN CLS APL DERMABOND .7 (GAUZE/BANDAGES/DRESSINGS) ×1
APL PRP STRL LF DISP 70% ISPRP (MISCELLANEOUS) ×1
APL SKNCLS STERI-STRIP NONHPOA (GAUZE/BANDAGES/DRESSINGS) ×1
BENZOIN TINCTURE PRP APPL 2/3 (GAUZE/BANDAGES/DRESSINGS) ×2 IMPLANT
BLADE SURG 15 STRL LF DISP TIS (BLADE) ×1 IMPLANT
BLADE SURG 15 STRL SS (BLADE) ×2
CHLORAPREP W/TINT 26 (MISCELLANEOUS) ×2 IMPLANT
COVER SURGICAL LIGHT HANDLE (MISCELLANEOUS) ×2 IMPLANT
COVER WAND RF STERILE (DRAPES) IMPLANT
DECANTER SPIKE VIAL GLASS SM (MISCELLANEOUS) ×2 IMPLANT
DERMABOND ADVANCED (GAUZE/BANDAGES/DRESSINGS) ×1
DERMABOND ADVANCED .7 DNX12 (GAUZE/BANDAGES/DRESSINGS) IMPLANT
DRAIN PENROSE 0.5X18 (DRAIN) ×2 IMPLANT
DRAPE LAPAROSCOPIC ABDOMINAL (DRAPES) ×2 IMPLANT
ELECT REM PT RETURN 15FT ADLT (MISCELLANEOUS) ×2 IMPLANT
GAUZE SPONGE 4X4 12PLY STRL (GAUZE/BANDAGES/DRESSINGS) ×1 IMPLANT
GLOVE SURG ENC MOIS LTX SZ6 (GLOVE) ×2 IMPLANT
GLOVE SURG UNDER LTX SZ6.5 (GLOVE) ×2 IMPLANT
GOWN STRL REUS W/TWL LRG LVL3 (GOWN DISPOSABLE) ×2 IMPLANT
GOWN STRL REUS W/TWL XL LVL3 (GOWN DISPOSABLE) ×2 IMPLANT
KIT BASIN OR (CUSTOM PROCEDURE TRAY) ×2 IMPLANT
KIT TURNOVER KIT A (KITS) IMPLANT
MESH ULTRAPRO 3X6 7.6X15CM (Mesh General) ×1 IMPLANT
NEEDLE HYPO 22GX1.5 SAFETY (NEEDLE) ×2 IMPLANT
PACK BASIC VI WITH GOWN DISP (CUSTOM PROCEDURE TRAY) ×2 IMPLANT
PENCIL SMOKE EVACUATOR (MISCELLANEOUS) IMPLANT
SPONGE LAP 4X18 RFD (DISPOSABLE) ×2 IMPLANT
STRIP CLOSURE SKIN 1/2X4 (GAUZE/BANDAGES/DRESSINGS) ×2 IMPLANT
SUT ETHIBOND 0 MO6 C/R (SUTURE) ×2 IMPLANT
SUT MNCRL AB 4-0 PS2 18 (SUTURE) ×2 IMPLANT
SUT PDS AB 0 CT1 36 (SUTURE) ×4 IMPLANT
SUT SILK 3 0 (SUTURE) ×2
SUT SILK 3-0 18XBRD TIE 12 (SUTURE) ×1 IMPLANT
SUT VIC AB 3-0 SH 27 (SUTURE) ×4
SUT VIC AB 3-0 SH 27XBRD (SUTURE) ×2 IMPLANT
SUT VICRYL 0 UR6 27IN ABS (SUTURE) IMPLANT
SUT VICRYL 3 0 BR 18  UND (SUTURE) ×2
SUT VICRYL 3 0 BR 18 UND (SUTURE) ×1 IMPLANT
SYR CONTROL 10ML LL (SYRINGE) ×2 IMPLANT
TAPE PAPER 3X10 WHT MICROPORE (GAUZE/BANDAGES/DRESSINGS) ×1 IMPLANT
TAPE STRIPS DRAPE STRL (GAUZE/BANDAGES/DRESSINGS) ×1 IMPLANT
TOWEL OR 17X26 10 PK STRL BLUE (TOWEL DISPOSABLE) ×2 IMPLANT
TOWEL OR NON WOVEN STRL DISP B (DISPOSABLE) ×2 IMPLANT
TRAY FOLEY MTR SLVR 16FR STAT (SET/KITS/TRAYS/PACK) IMPLANT

## 2020-04-30 NOTE — Anesthesia Postprocedure Evaluation (Signed)
Anesthesia Post Note  Patient: Jillian Rodgers  Procedure(s) Performed: OPEN LEFT INGUINAL HERNIA REPAIR WITH MESH (Left Groin)     Patient location during evaluation: PACU Anesthesia Type: General Level of consciousness: awake and alert Pain management: pain level controlled Vital Signs Assessment: post-procedure vital signs reviewed and stable Respiratory status: spontaneous breathing, nonlabored ventilation, respiratory function stable and patient connected to nasal cannula oxygen Cardiovascular status: blood pressure returned to baseline and stable Postop Assessment: no apparent nausea or vomiting Anesthetic complications: no   No complications documented.  Last Vitals:  Vitals:   04/30/20 1030 04/30/20 1045  BP: 137/76 115/70  Pulse: 91 85  Resp: 14 12  Temp:    SpO2: 94% 95%    Last Pain:  Vitals:   04/30/20 1030  TempSrc:   PainSc: 2                  [place]et [place]

## 2020-04-30 NOTE — Anesthesia Procedure Notes (Signed)
Procedure Name: Intubation Date/Time: 04/30/2020 7:31 AM Performed by: Raenette Rover, CRNA Pre-anesthesia Checklist: Patient identified, Emergency Drugs available, Suction available and Patient being monitored Patient Re-evaluated:Patient Re-evaluated prior to induction Oxygen Delivery Method: Circle system utilized Preoxygenation: Pre-oxygenation with 100% oxygen Induction Type: IV induction Ventilation: Mask ventilation without difficulty Laryngoscope Size: Mac and 3 Grade View: Grade I Tube type: Oral Tube size: 7.0 mm Number of attempts: 1 Airway Equipment and Method: Stylet Placement Confirmation: ETT inserted through vocal cords under direct vision,  positive ETCO2 and breath sounds checked- equal and bilateral Secured at: 21 cm Tube secured with: Tape Dental Injury: Teeth and Oropharynx as per pre-operative assessment

## 2020-04-30 NOTE — Discharge Instructions (Signed)
HERNIA REPAIR: POST OP INSTRUCTIONS   EAT Gradually transition to a high fiber diet with a fiber supplement over the next few weeks after discharge.  Start with a pureed / full liquid diet (see below)  WALK Walk an hour a day (cumulative- not all at once).  Control your pain to do that.    CONTROL PAIN Control pain so that you can walk, sleep, tolerate sneezing/coughing, and go up/down stairs.  HAVE A BOWEL MOVEMENT DAILY Keep your bowels regular to avoid problems.  OK to try a laxative to override constipation.  OK to use an antidairrheal to slow down diarrhea.  Call if not better after 2 tries  CALL IF YOU HAVE PROBLEMS/CONCERNS Call if you are still struggling despite following these instructions. Call if you have concerns not answered by these instructions  ######################################################################    1. DIET: Follow a light bland diet & liquids the first 24 hours after arrival home, such as soup, liquids, starches, etc.  Be sure to drink plenty of fluids.  Quickly advance to a usual solid diet within a few days.  Avoid fast food or heavy meals as your are more likely to get nauseated or have irregular bowels.  A low-sugar, high-fiber diet for the rest of your life is ideal.   2. Take your usually prescribed home medications unless otherwise directed.  3. PAIN CONTROL: a. Pain is best controlled by a usual combination of three different methods TOGETHER: i. Ice/Heat ii. Over the counter pain medication iii. Prescription pain medication b. Most patients will experience some swelling and bruising around the hernia(s) such as the bellybutton, groins, or old incisions.  Ice packs or heating pads (30-60 minutes up to 6 times a day) will help. Use ice for the first few days to help decrease swelling and bruising, then switch to heat to help relax tight/sore spots and speed recovery.  Some people prefer to use ice alone, heat alone, alternating between ice &  heat.  Experiment to what works for you.  Swelling and bruising can take several weeks to resolve.   c. It is helpful to take an over-the-counter pain medication regularly for the first days: i. Naproxen (Aleve, etc)  Two 220mg tabs twice a day OR Ibuprofen (Advil, etc) Three 200mg tabs four times a day (every meal & bedtime) AND ii. Acetaminophen (Tylenol, etc) 325-650mg four times a day (every meal & bedtime) d. A  prescription for pain medication should be given to you upon discharge.  Take your pain medication as prescribed, IF NEEDED.  i. If you are having problems/concerns with the prescription medicine (does not control pain, nausea, vomiting, rash, itching, etc), please call us [phone] to see if we need to switch you to a different pain medicine that will work better for you and/or control your side effect better. ii. If you need a refill on your pain medication, please contact your pharmacy.  They will contact our office to request authorization. Prescriptions will not be filled after 5 pm or on week-ends.  4. Avoid getting constipated.  Between the surgery and the pain medications, it is common to experience some constipation.  Increasing fluid intake and taking a fiber supplement (such as Metamucil, Citrucel, FiberCon, MiraLax, etc) 1-2 times a day regularly will usually help prevent this problem from occurring.  A mild laxative (prune juice, Milk of Magnesia, MiraLax, etc) should be taken according to package directions if there are no bowel movements after 48 hours.    5.   Wash / shower every day, starting 2 days after surgery.  You may shower over the steri strips which are waterproof.   ° °6. Remove your outer bandage 2 days after surgery. Steri strips will peel off after 1-2 weeks.  You may leave the incision open to air.  You may replace a dressing/Band-Aid to cover an incision for comfort if you wish.  Continue to shower over incision(s) after the dressing is off. ° °7. ACTIVITIES  as tolerated:   °a. You may resume regular (light) daily activities beginning the next day--such as daily self-care, walking, climbing stairs--gradually increasing activities as tolerated.  Control your pain so that you can walk an hour a day.  If you can walk 30 minutes without difficulty, it is safe to try more intense activity such as jogging, treadmill, bicycling, low-impact aerobics, swimming, etc. °b. Refrain from the most intensive and strenuous activity such as sit-ups, heavy lifting, contact sports, etc  Refrain from any heavy lifting or straining until 6 weeks after surgery.   °c. DO NOT PUSH THROUGH PAIN.  Let pain be your guide: If it hurts to do something, don't do it.  Pain is your body warning you to avoid that activity for another week until the pain goes down. °d. You may drive when you are no longer taking prescription pain medication, you can comfortably wear a seatbelt, and you can safely maneuver your car and apply brakes. °e. You may have sexual intercourse when it is comfortable.  ° °8. FOLLOW UP in our office °a. Please call [facility] at [phone] to set up an appointment to see your surgeon in the office for a follow-up appointment approximately 2-3 weeks after your surgery. °b. Make sure that you call for this appointment the day you arrive home to insure a convenient appointment time. ° °9.  If you have disability of FMLA / Family leave forms, please bring the forms to the office for processing.  (do not give to your surgeon). ° °WHEN TO CALL US [phone]: °1. Poor pain control °2. Reactions / problems with new medications (rash/itching, nausea, etc)  °3. Fever over 101.5 F (38.5 C) °4. Inability to urinate °5. Nausea and/or vomiting °6. Worsening swelling or bruising °7. Continued bleeding from incision. °8. Increased pain, redness, or drainage from the incision ° ° The clinic staff is available to answer your questions during regular business hours (8:30am-5pm).  Please don’t  hesitate to call and ask to speak to one of our nurses for clinical concerns.  ° If you have a medical emergency, go to the nearest emergency room or call 911. ° A surgeon from [facility] is always on call at the hospitals in [place] ° °[facility] °[address], [address], [place], [place]  [address] ? ° [address] [address], [place], [place]   [address] °MAIN: [phone] ? TOLL FREE: [phone] ? FAX: [phone] °[email or web address] ° °

## 2020-04-30 NOTE — Op Note (Signed)
Operative Note  Jillian Rodgers Jillian Rodgers  [id number]  [id number]  04/30/2020   Surgeon: Clovis Riley MD FACS   Procedure performed: Open inguinal hernia repair with UltraPro mesh: left indirect   Preop diagnosis:  left inguinal hernia   Post-op diagnosis/intraop findings: Large left indirect inguinal hernia   Specimens: none   EBL: 5cc   Complications: none   Description of procedure: After obtaining informed consent, the patient was taken to the operating room and placed supine on operating room table where general anesthesia was initiated, preoperative antibiotics were administered, SCDs applied, and a formal timeout was performed. The left groin was prepped and draped in the usual sterile fashion. An oblique incision was made in the just above the inguinal ligament after infiltrating the tissues with local anesthetic (Exparel mixed with quarter percent Marcaine with epinephrine). Soft tissues were dissected using electrocautery until the external oblique aponeurosis was encountered. This was divided sharply to expand the external ring. A plane was bluntly developed between the hernia sac/round ligament and the external oblique. Inspection of the inguinal anatomy revealed a large left indirect hernia. The indirect hernia sac was bluntly dissected away from the surrounding muscle fibers and adjacent round ligament.   The sac was somewhat fat infiltrated and thick.  This was reduced into the abdomen.    The round ligament was divided in each and ligated with 3-0 Vicryl ties.  The internal ring was closed and the inguinal floor reconstructed with interrupted figure-of-eight 0 PDS.  A 3 x 6 piece of ultra Pro mesh was brought onto the field and trimmed to approximate the field. This was sutured to the pubic tubercle fascia, inferior shelving edge and to the internal oblique superiorly all with interrupted 0 Ethibond's.  The central portion of the mesh was also sutured to the inguinal floor  repair with 2 interrupted 0 Ethibond's.  Hemostasis was ensured within the wound. The external oblique aponeurosis was reapproximated with a running 3-0 Vicryl to re-create a narrowed external ring. More local was infiltrated around the pubic tubercle and in the plane just below the external oblique. The Scarpa's was reapproximated with interrupted 3-0 Vicryls. The skin was closed with a running subcuticular Monocryl. The remainder of the local was injected in the subcutaneous and subcuticular space. The field was then cleaned, benzoin and Steri-Strips and sterile bandage were applied. The patient was then awakened extubated and taken to PACU in stable condition.    All counts were correct at the completion of the case.

## 2020-04-30 NOTE — H&P (Signed)
Surgical Evaluation   HPI: returns and is ready to proceed with scheduling for a left inguinal hernia.  Reports no changes in the hernia since her last visit.  She has noticed that on some occasions it will be less swollen and painful after she urinates.  She is having surgery for hammertoe in September and is hoping to have the hernia repaired before that.    Intiial visit 02/27/19: This is a very pleasant 75 year old woman who presents with a left inguinal hernia. She has noticed a bulge in this area for some time which is occasionally painful. It is always reducible. She does note intermittent constipation. No prior inguinal hernia surgery but she has had an open cholecystectomy as well as an umbilical hernia repair followed by laparoscopic recurrent ventral hernia repair. CT scan in 2018 demonstrates a left inguinal hernia containing bowel however interestingly she had an ultrasound this year which was negative. Medical history notable for hypertension, hyperlipidemia, deficiency, GERD, prediabetes, hypothyroid.  No Known Allergies      Past Medical History:  Diagnosis Date  . Degenerative joint disease   . Diabetes mellitus without complication ([place])   . GERD (gastroesophageal reflux disease)   . Hyperlipidemia   . Hypertension   . Migraines    neurontin helps  . Pre-diabetes   . Thyroid disease          Past Surgical History:  Procedure Laterality Date  . CHOLECYSTECTOMY  1996   open  . COLONOSCOPY  2009   recommended 10 year f/u  . HERNIA REPAIR  1985   umb hernia rpr  . HERNIA REPAIR  2005 & 2009   ventral hernia rpr  . PILONIDAL CYST EXCISION  1972  . TONSILLECTOMY    . UPPER GI ENDOSCOPY    . VENTRAL HERNIA REPAIR           Family History  Problem Relation Age of Onset  . Ovarian cancer Mother   . Heart disease Mother   . Cancer Mother   . Uterine cancer Mother   . Hypertension Mother   . Breast cancer Sister   .  Stroke Paternal Uncle   . Congestive Heart Failure Father   . Gout Maternal Aunt   . Colon cancer Neg Hx   . Esophageal cancer Neg Hx   . Stomach cancer Neg Hx   . Rectal cancer Neg Hx     Social History        Socioeconomic History  . Marital status: Married    Spouse name: Not on file  . Number of children: Not on file  . Years of education: Not on file  . Highest education level: Not on file  Occupational History  . Not on file  Tobacco Use  . Smoking status: Never Smoker  . Smokeless tobacco: Never Used  Vaping Use  . Vaping Use: Never used  Substance and Sexual Activity  . Alcohol use: No  . Drug use: No  . Sexual activity: Not on file  Other Topics Concern  . Not on file  Social History Narrative  . Not on file   Social Determinants of Health      Financial Resource Strain:   . Difficulty of Paying Living Expenses:   Food Insecurity:   . Worried About Charity fundraiser in the Last Year:   . Arboriculturist in the Last Year:   Transportation Needs:   . Film/video editor (Medical):   [name] Kitchen Lack of  Transportation (Non-Medical):   Physical Activity:   . Days of Exercise per Week:   . Minutes of Exercise per Session:   Stress:   . Feeling of Stress :   Social Connections:   . Frequency of Communication with Friends and Family:   . Frequency of Social Gatherings with Friends and Family:   . Attends Religious Services:   . Active Member of Clubs or Organizations:   . Attends Archivist Meetings:   [name] Kitchen Marital Status:           Current Outpatient Medications on File Prior to Visit  Medication Sig Dispense Refill  . Ascorbic Acid (VITAMIN C) 500 MG CAPS Take 1 capsule by mouth daily.    [name] Kitchen aspirin 81 MG tablet Take 81 mg by mouth daily.    [name] Kitchen BOTOX 100 units SOLR injection Every 6 to 8 weeks for headaches    . Calcium Carbonate-Vit D-Min (CALCIUM 1200 PO) Take 1,200 Units by mouth daily.    . cetirizine (ZYRTEC) 10 MG  tablet Take 10 mg by mouth daily.    . Cholecalciferol (VITAMIN D PO) Take 5,000 Units by mouth daily.    [name] Kitchen gabapentin (NEURONTIN) 600 MG tablet Take 600 mg by mouth 4 (four) times daily.     . hyoscyamine (LEVSIN SL) 0.125 MG SL tablet DISSOLVE 1 TO 2 TABLETS UNDER THE TONGUE EVERY 4 HOURS AS NEEDED FOR NAUSEA OR CRAMPING OR DIARRHEA 30 tablet 3  . levothyroxine (SYNTHROID) 50 MCG tablet Take 1 tablet daily on an empty stomach with only water for 30 minutes & no Antacid meds, Calcium or Magnesium for 4 hours & avoid Biotin 90 tablet 0  . pantoprazole (PROTONIX) 40 MG tablet Take 1 tablet Daily for Acid Indigestion & Heartburn 90 tablet 3  . pravastatin (PRAVACHOL) 40 MG tablet Take 1 tablet at Bedtime for Cholesterol 90 tablet 3   No current facility-administered medications on file prior to visit.    Review of Systems: a complete, 10pt review of systems was completed with pertinent positives and negatives as documented in the HPI  Physical Exam: Weight: 160 lb Height: 63in Body Surface Area: 1.76 m Body Mass Index: 28.34 kg/m  Temp.: 98.80F  Pulse: 63 (Regular)  P.OX: 82% (Room air) BP: 122/82(Sitting, Left Arm, Standard)   Gen: alert and well appearing Eye: extraocular motion intact, no scleral icterus ENT: moist mucus membranes, dentition intact Neck: no mass or thyromegaly Chest: unlabored respirations, symmetrical air entry, clear bilaterally CV: regular rate and rhythm, no pedal edema Abdomen: soft, nontender, nondistended. No mass or organomegaly. Well-healed right upper quadrant scar, umbilical scar. Port sites from prior laparoscopic hernia repair are not really visible. There is a reducible large left inguinal hernia. Laxity on the right with some likely fat in the inguinal canal but no palpable hernia on the right. MSK: strength symmetrical throughout, no deformity Neuro: grossly intact, normal gait Psych: normal mood and affect, appropriate  insight Skin: warm and dry, no rash or lesion on limited exam   CBC Latest Ref Rng & Units 10/08/2019 07/01/2019 02/19/2019  WBC 3.8 - 10.8 Thousand/uL 7.4 6.6 7.8  Hemoglobin 11.7 - 15.5 g/dL 12.5 12.6 12.4  Hematocrit 35 - 45 % 38.8 37.8 36.3  Platelets 140 - 400 Thousand/uL 84(L) 70(L) 95(L)    CMP Latest Ref Rng & Units 10/08/2019 07/01/2019 02/19/2019  Glucose 65 - 99 mg/dL 73 87 79  BUN 7 - 25 mg/dL 33(H) 29(H) 28(H)  Creatinine 0.60 - 0.93  mg/dL 1.38(H) 1.08(H) 1.25(H)  Sodium 135 - 146 mmol/L 142 141 140  Potassium 3.5 - 5.3 mmol/L 4.1 4.6 4.2  Chloride 98 - 110 mmol/L 105 107 104  CO2 20 - 32 mmol/L 30 26 26   Calcium 8.6 - 10.4 mg/dL 10.5(H) 9.6 10.0  Total Protein 6.1 - 8.1 g/dL 6.5 6.4 6.2  Total Bilirubin 0.2 - 1.2 mg/dL 0.5 0.5 0.4  Alkaline Phos 39 - 117 U/L - - -  AST 10 - 35 U/L 15 19 18   ALT 6 - 29 U/L 13 17 15     Recent Labs       Lab Results  Component Value Date   INR 0.9 11/29/2007      Imaging: Imaging Results (Last 48 hours)  No results found.     A/P: LEFT INGUINAL HERNIA (K40.90) Story: Somewhat large, clearly intermittently contains bowel, reducible. Minimally symptomatic. Given size I recommend an open hernia repair with mesh and we discussed the procedure and risks of bleeding, infection, pain, scarring, injury to intra-abdominal or retroperitoneal structures including bowel, bladder, nerves, blood vessels, etc., discussed risk of chronic pain or numbness in the groin, discussed risk of hernia recurrence. We also discussed the general risks of anesthesia including cardiac, pulmonary and vascular complications. Questions were answered. We discussed signs and symptoms of incarceration which should prompt her to seek emergency treatment. She is ready to proceed with scheduling.        Patient Active Problem List   Diagnosis Date Noted  . Migraine without status migrainosus, not intractable 10/08/2019  . Inguinal hernia 02/19/2019   . Mammogram abnormal 08/07/2018  . Thrombocytopenia ([place]) 11/25/2015  . Encounter for Medicare annual wellness exam 06/14/2015  . Overweight (BMI 25.0-29.9) 01/28/2015  . CKD (chronic kidney disease) stage 3, GFR 30-59 ml/min ([facility]) 01/19/2014  . Upper airway cough syndrome 12/16/2013  . Vitamin D deficiency 05/21/2013  . Other abnormal glucose (prediabetes) 05/21/2013  . Medication management 05/21/2013  . Essential hypertension 01/31/2013  . Degenerative joint disease   . GERD (gastroesophageal reflux disease)   . Hyperlipidemia   . Hypothyroidism        Romana Juniper, MD [facility], [place]

## 2020-04-30 NOTE — Transfer of Care (Signed)
Immediate Anesthesia Transfer of Care Note  Patient: Jillian Rodgers  Procedure(s) Performed: OPEN LEFT INGUINAL HERNIA REPAIR WITH MESH (Left Groin)  Patient Location: PACU  Anesthesia Type:General  Level of Consciousness: awake, drowsy and patient cooperative  Airway & Oxygen Therapy: Patient Spontanous Breathing and Patient connected to face mask oxygen  Post-op Assessment: Report given to RN and Post -op Vital signs reviewed and stable  Post vital signs: Reviewed and stable--pt shivering but BP close to where it started in the O.R.  Last Vitals:  Vitals Value Taken Time  BP 180/105 04/30/20 0856  Temp    Pulse 95 04/30/20 0900  Resp 23 04/30/20 0900  SpO2 96 % 04/30/20 0900  Vitals shown include unvalidated device data.  Last Pain:  Vitals:   04/30/20 0609  TempSrc: Oral         Complications: No complications documented.

## 2020-05-03 ENCOUNTER — Encounter (HOSPITAL_COMMUNITY): Payer: Self-pay | Admitting: Surgery

## 2020-05-13 ENCOUNTER — Other Ambulatory Visit: Payer: Self-pay | Admitting: Internal Medicine

## 2020-05-13 DIAGNOSIS — E039 Hypothyroidism, unspecified: Secondary | ICD-10-CM

## 2020-05-21 DIAGNOSIS — M79675 Pain in left toe(s): Secondary | ICD-10-CM | POA: Diagnosis not present

## 2020-05-21 DIAGNOSIS — B351 Tinea unguium: Secondary | ICD-10-CM | POA: Diagnosis not present

## 2020-05-21 DIAGNOSIS — M65871 Other synovitis and tenosynovitis, right ankle and foot: Secondary | ICD-10-CM | POA: Diagnosis not present

## 2020-05-21 DIAGNOSIS — M65872 Other synovitis and tenosynovitis, left ankle and foot: Secondary | ICD-10-CM | POA: Diagnosis not present

## 2020-05-21 DIAGNOSIS — M79674 Pain in right toe(s): Secondary | ICD-10-CM | POA: Diagnosis not present

## 2020-05-24 ENCOUNTER — Other Ambulatory Visit: Payer: Self-pay

## 2020-05-24 ENCOUNTER — Other Ambulatory Visit: Payer: Self-pay | Admitting: Internal Medicine

## 2020-05-24 DIAGNOSIS — N281 Cyst of kidney, acquired: Secondary | ICD-10-CM | POA: Diagnosis not present

## 2020-05-24 DIAGNOSIS — Z6827 Body mass index (BMI) 27.0-27.9, adult: Secondary | ICD-10-CM | POA: Diagnosis not present

## 2020-05-24 DIAGNOSIS — I129 Hypertensive chronic kidney disease with stage 1 through stage 4 chronic kidney disease, or unspecified chronic kidney disease: Secondary | ICD-10-CM | POA: Diagnosis not present

## 2020-05-24 DIAGNOSIS — N183 Chronic kidney disease, stage 3 unspecified: Secondary | ICD-10-CM | POA: Diagnosis not present

## 2020-05-27 DIAGNOSIS — M791 Myalgia, unspecified site: Secondary | ICD-10-CM | POA: Diagnosis not present

## 2020-05-27 DIAGNOSIS — G518 Other disorders of facial nerve: Secondary | ICD-10-CM | POA: Diagnosis not present

## 2020-05-27 DIAGNOSIS — G43111 Migraine with aura, intractable, with status migrainosus: Secondary | ICD-10-CM | POA: Diagnosis not present

## 2020-05-27 DIAGNOSIS — G43719 Chronic migraine without aura, intractable, without status migrainosus: Secondary | ICD-10-CM | POA: Diagnosis not present

## 2020-05-27 DIAGNOSIS — M542 Cervicalgia: Secondary | ICD-10-CM | POA: Diagnosis not present

## 2020-05-27 DIAGNOSIS — G43019 Migraine without aura, intractable, without status migrainosus: Secondary | ICD-10-CM | POA: Diagnosis not present

## 2020-06-06 ENCOUNTER — Other Ambulatory Visit: Payer: Self-pay | Admitting: Internal Medicine

## 2020-06-06 DIAGNOSIS — K219 Gastro-esophageal reflux disease without esophagitis: Secondary | ICD-10-CM

## 2020-06-06 MED ORDER — PANTOPRAZOLE SODIUM 40 MG PO TBEC: DELAYED_RELEASE_TABLET | ORAL | 0 refills | Status: DC

## 2020-06-06 MED ORDER — PANTOPRAZOLE SODIUM 40 MG PO TBEC
DELAYED_RELEASE_TABLET | ORAL | 0 refills | Status: DC
Start: 2020-06-06 — End: 2020-06-07

## 2020-06-07 ENCOUNTER — Other Ambulatory Visit: Payer: Self-pay | Admitting: Internal Medicine

## 2020-06-07 DIAGNOSIS — K219 Gastro-esophageal reflux disease without esophagitis: Secondary | ICD-10-CM

## 2020-06-07 MED ORDER — PANTOPRAZOLE SODIUM 40 MG PO TBEC
DELAYED_RELEASE_TABLET | ORAL | 0 refills | Status: DC
Start: 2020-06-07 — End: 2020-12-23

## 2020-06-24 ENCOUNTER — Other Ambulatory Visit: Payer: Medicare PPO

## 2020-06-30 ENCOUNTER — Encounter (HOSPITAL_COMMUNITY): Payer: Self-pay | Admitting: Surgery

## 2020-07-05 ENCOUNTER — Ambulatory Visit
Admission: RE | Admit: 2020-07-05 | Discharge: 2020-07-05 | Disposition: A | Discharge status: Home / Self Care | Payer: Medicare PPO | Source: Ambulatory Visit | Attending: Obstetrics and Gynecology | Admitting: Obstetrics and Gynecology

## 2020-07-05 ENCOUNTER — Other Ambulatory Visit: Payer: Self-pay

## 2020-07-05 DIAGNOSIS — M8580 Other specified disorders of bone density and structure, unspecified site: Secondary | ICD-10-CM

## 2020-07-05 DIAGNOSIS — M85852 Other specified disorders of bone density and structure, left thigh: Secondary | ICD-10-CM | POA: Diagnosis not present

## 2020-07-05 DIAGNOSIS — M810 Age-related osteoporosis without current pathological fracture: Secondary | ICD-10-CM | POA: Diagnosis not present

## 2020-07-07 ENCOUNTER — Other Ambulatory Visit: Payer: Self-pay

## 2020-07-07 ENCOUNTER — Encounter: Payer: Self-pay | Admitting: Hematology and Oncology

## 2020-07-07 ENCOUNTER — Inpatient Hospital Stay: Payer: Medicare PPO

## 2020-07-07 ENCOUNTER — Inpatient Hospital Stay: Payer: Medicare PPO | Attending: Hematology and Oncology | Admitting: Hematology and Oncology

## 2020-07-07 VITALS — BP 116/87 | HR 80 | Temp 97.0°F | Resp 17 | Ht 63.0 in | Wt 162.3 lb

## 2020-07-07 DIAGNOSIS — D509 Iron deficiency anemia, unspecified: Secondary | ICD-10-CM | POA: Diagnosis not present

## 2020-07-07 DIAGNOSIS — D696 Thrombocytopenia, unspecified: Secondary | ICD-10-CM | POA: Diagnosis not present

## 2020-07-07 DIAGNOSIS — E039 Hypothyroidism, unspecified: Secondary | ICD-10-CM | POA: Insufficient documentation

## 2020-07-07 LAB — CBC WITH DIFFERENTIAL/PLATELET
Abs Immature Granulocytes: 0.03 10*3/uL (ref 0.00–0.07)
Basophils Absolute: 0 10*3/uL (ref 0.0–0.1)
Basophils Relative: 1 %
Eosinophils Absolute: 0.1 10*3/uL (ref 0.0–0.5)
Eosinophils Relative: 2 %
HCT: 38.8 % (ref 36.0–46.0)
Hemoglobin: 12.8 g/dL (ref 12.0–15.0)
Immature Granulocytes: 0 %
Lymphocytes Relative: 23 %
Lymphs Abs: 1.7 10*3/uL (ref 0.7–4.0)
MCH: 31.2 pg (ref 26.0–34.0)
MCHC: 33 g/dL (ref 30.0–36.0)
MCV: 94.6 fL (ref 80.0–100.0)
Monocytes Absolute: 0.7 10*3/uL (ref 0.1–1.0)
Monocytes Relative: 9 %
Neutro Abs: 4.8 10*3/uL (ref 1.7–7.7)
Neutrophils Relative %: 65 %
Platelets: 58 10*3/uL — ABNORMAL LOW (ref 150–400)
RBC: 4.1 MIL/uL (ref 3.87–5.11)
RDW: 12.8 % (ref 11.5–15.5)
WBC: 7.3 10*3/uL (ref 4.0–10.5)
nRBC: 0 % (ref 0.0–0.2)

## 2020-07-07 LAB — CMP (CANCER CENTER ONLY)
ALT: 21 U/L (ref 0–44)
AST: 18 U/L (ref 15–41)
Albumin: 3.7 g/dL (ref 3.5–5.0)
Alkaline Phosphatase: 83 U/L (ref 38–126)
Anion gap: 11 (ref 5–15)
BUN: 26 mg/dL — ABNORMAL HIGH (ref 8–23)
CO2: 25 mmol/L (ref 22–32)
Calcium: 9.6 mg/dL (ref 8.9–10.3)
Chloride: 109 mmol/L (ref 98–111)
Creatinine: 1.01 mg/dL — ABNORMAL HIGH (ref 0.44–1.00)
GFR, Estimated: 58 mL/min — ABNORMAL LOW (ref >60)
Glucose, Bld: 61 mg/dL — ABNORMAL LOW (ref 70–99)
Potassium: 4.2 mmol/L (ref 3.5–5.1)
Sodium: 145 mmol/L (ref 135–145)
Total Bilirubin: 0.6 mg/dL (ref 0.3–1.2)
Total Protein: 6.6 g/dL (ref 6.5–8.1)

## 2020-07-07 LAB — TSH: TSH: 1.397 u[IU]/mL (ref 0.308–3.960)

## 2020-07-07 LAB — IRON AND TIBC
Iron: 93 ug/dL (ref 41–142)
Saturation Ratios: 37 % (ref 21–57)
TIBC: 253 ug/dL (ref 236–444)
UIBC: 160 ug/dL (ref 120–384)

## 2020-07-07 LAB — FERRITIN: Ferritin: 240 ng/mL (ref 11–307)

## 2020-07-07 NOTE — Assessment & Plan Note (Signed)
She received 2 infusions of ferraheme on 04/19/2020 and 04/26/2020 She may have felt a little better after, she was able to walk more. PE unremarkable today. Repeat Iron panel and ferritin ordered.

## 2020-07-07 NOTE — Assessment & Plan Note (Addendum)
This is a very pleasant 75 year old female patient with past medical history significant for hypertension, dyslipidemia, hypothyroidism who was referred for evaluation of thrombocytopenia.   Given slowly progressive thrombocytopenia over several years and no symptoms, we proceeded with BMB. BMB showed hypercellular bone marrow for age with trilineage hematopoesis. Several small lymphoid aggregates present.Flow cytometric analysis failed to show any monoclonal B-cell population or aberrant  T-cell phenotype and hence a reactive lymphoid process is favored. Iron  stores are markedly decreased/absent and hence correlation with serum  iron studies is recommended.  She is here for follow up. ROS unremarkable. PE today unremarkable, no concerns. We will repeat CBC, CMP, iron panel and ferritin today. If thrombocytopenia is stable, we will continue to monitor it.

## 2020-07-07 NOTE — Progress Notes (Signed)
[facility] NOTE  Patient Care Team: Jillian Pinto, MD as PCP - General (Internal Medicine) Jillian Parish, [name]  CHIEF COMPLAINTS/PURPOSE OF CONSULTATION:  Thrombocytopenia  ASSESSMENT & PLAN:   Thrombocytopenia [facility]) This is a very pleasant 75 year old female patient with past medical history significant for hypertension, dyslipidemia, hypothyroidism who was referred for evaluation of thrombocytopenia.   Given slowly progressive thrombocytopenia over several years and no symptoms, we proceeded with BMB. BMB showed hypercellular bone marrow for age with trilineage hematopoesis. Several small lymphoid aggregates present.Flow cytometric analysis failed to show any monoclonal B-cell population or aberrant  T-cell phenotype and hence a reactive lymphoid process is favored. Iron  stores are markedly decreased/absent and hence correlation with serum  iron studies is recommended.  She is here for follow up. ROS unremarkable. PE today unremarkable, no concerns. We will repeat CBC, CMP, iron panel and ferritin today. If thrombocytopenia is stable, we will continue to monitor it.    IDA (iron deficiency anemia) She received 2 infusions of ferraheme on 04/19/2020 and 04/26/2020 She may have felt a little better after, she was able to walk more. PE unremarkable today. Repeat Iron panel and ferritin ordered.  Orders Placed This Encounter  Procedures  . CBC with Differential/Platelet    Standing Status:   Standing    Number of Occurrences:   22    Standing Expiration Date:   07/07/2021  . CMP ([place] only)    Standing Status:   Future    Standing Expiration Date:   07/07/2021  . Iron and TIBC    Standing Status:   Future    Standing Expiration Date:   07/07/2021  . Ferritin    Standing Status:   Future    Standing Expiration Date:   07/07/2021    Given family history of ovarian cancer, I also recommended to  consider genetic testing.  She would like to think about this.  HISTORY OF PRESENTING ILLNESS:  Jillian Rodgers 75 y.o. female is here because of thrombocytopenia.  History from initial visit  75 year old female patient with past medical history significant for hypertension, hypothyroidism, dyslipidemia referred to hematology for evaluation of thrombocytopenia.  Ms. Jillian Rodgers arrived to the appointment with her husband.  She denies any new health complaints.  She was supposed to have an inguinal hernia surgery on the left and as a part of preop work-up had some blood work which demonstrated thrombocytopenia and hence she was referred to hematology.  She tells me that she had some minor procedures this year but was never told that she had thrombocytopenia.  She denies any history of bleeding otherwise.  She denies any B symptoms.  No history of autoimmune diseases, new medications, hepatitis C or alcohol intake or known liver disease. She denies any change in breathing, bowel habits, hematochezia, melena, hematuria etc.  She does report some mild fatigue but attributes to lack of physical activity during the pandemic.  Family history quite significant for maternal family with ovarian cancer in mom, breast cancer in sister who also had thyroid cancer.  There are some of her cousins on maternal side who had breast cancer.  She denies any personal history of cancers. She denies any known liver diseases or alcohol intake.  No herbal supplements.  Last Covid vaccination in October 2021.   Interim History  She is here for Follow up on thrombocytopenia and IDA She feels pretty much at baseline. No complaints today. No bleeding noted  from gingiva or nose. No change in breathing, bowel or urinary habits. No new neurological complaints.  REVIEW OF SYSTEMS:    Constitutional: Denies fevers, chills or abnormal night sweats Eyes: Denies blurriness of vision, double vision or watery eyes Ears, nose,  mouth, throat, and face: Denies mucositis or sore throat Respiratory: Denies cough,  Cardiovascular: Denies palpitation, chest discomfort or lower extremity swelling Gastrointestinal:  Denies nausea, heartburn or change in bowel habits Skin: Denies abnormal skin rashes Lymphatics: Denies new lymphadenopathy or easy bruising Neurological:Denies numbness, tingling or new weaknesses Behavioral/Psych: Mood is stable, no new changes  All other systems were reviewed with the patient and are negative.  MEDICAL HISTORY:  Past Medical History:  Diagnosis Date  . Anemia    Iron infusion on 04/19/2020   . Anemia with low platelet count ([place])   . Cancer (HCC)    HX OF SKIN CANCER   . Chronic kidney disease   . Degenerative joint disease   . Dyspnea    wiht exertion   . GERD (gastroesophageal reflux disease)   . History of kidney stones   . Hyperlipidemia   . Hypertension    off of b/p meds due to blood pressure fluctuating - discontinued by DR Jillian Rodgers   . Hypothyroidism   . Migraines    neurontin helps  . Pneumonia    hx of walking pneumonia   . Thyroid disease     SURGICAL HISTORY: Past Surgical History:  Procedure Laterality Date  . BILATERAL CATARACT SURGERY     . [place]   open  . COLONOSCOPY  2009   recommended 10 year f/u  . HERNIA REPAIR  1985   umb hernia rpr  . HERNIA REPAIR  2005 & 2009   ventral hernia rpr  . INGUINAL HERNIA REPAIR Left 04/30/2020   Procedure: OPEN LEFT INGUINAL HERNIA REPAIR WITH MESH;  Surgeon: Jillian Riley, MD;  Location: [facility];  Service: General;  Laterality: Left;  . PILONIDAL CYST EXCISION  1972  . SKIN CANCER REMOVAL     . TONSILLECTOMY    . UPPER GI ENDOSCOPY    . VENTRAL HERNIA REPAIR      SOCIAL HISTORY: Social History   Socioeconomic History  . Marital status: Married    Spouse name: Not on file  . Number of children: 2  . Years of education: 13  . Highest education level: Not on file   Occupational History  . Not on file  Tobacco Use  . Smoking status: Never Smoker  . Smokeless tobacco: Never Used  Vaping Use  . Vaping Use: Never used  Substance and Sexual Activity  . Alcohol use: No  . Drug use: No  . Sexual activity: Not on file  Other Topics Concern  . Not on file  Social History Narrative  . Not on file   Social Determinants of Health   Financial Resource Strain: Not on file  Food Insecurity: Not on file  Transportation Needs: Not on file  Physical Activity: Not on file  Stress: Not on file  Social Connections: Not on file  Intimate Partner Violence: Not on file    FAMILY HISTORY: Family History  Problem Relation Age of Onset  . Ovarian cancer Mother   . Heart disease Mother   . Cancer Mother   . Uterine cancer Mother   . Hypertension Mother   . Breast cancer Sister   . Stroke Paternal Uncle   .  Congestive Heart Failure Father   . Gout Maternal Aunt   . Colon cancer Neg Hx   . Esophageal cancer Neg Hx   . Stomach cancer Neg Hx   . Rectal cancer Neg Hx     ALLERGIES:  has No Known Allergies.  MEDICATIONS:  Current Outpatient Medications  Medication Sig Dispense Refill  . Ascorbic Acid (VITAMIN C) 500 MG CAPS Take 500 mg by mouth daily.     . cetirizine (ZYRTEC) 10 MG tablet Take 10 mg by mouth daily.    . Cholecalciferol (VITAMIN D PO) Take 5,000 Units by mouth daily.    [name] Kitchen gabapentin (NEURONTIN) 600 MG tablet Take 900-1,200 mg by mouth See admin instructions. Take 900 mg by mouth in morning and 1200 mg at bedtime    . levothyroxine (SYNTHROID) 50 MCG tablet Take  1 tablet  Daily  on an empty stomach with only water for 30 minutes & no Antacid meds, Calcium or Magnesium for 4 hours & avoid Biotin 90 tablet 0  . OnabotulinumtoxinA (BOTOX IJ) Inject 1 vial into the muscle every 8 (eight) weeks.     . pantoprazole (PROTONIX) 40 MG tablet Take  1 tablet  Daily  for Acid Indigestion & Heartburn 90 tablet 0  . pravastatin (PRAVACHOL) 40 MG  tablet TAKE 1 TABLET BY MOUTH AT BEDTIME FOR CHOLESTEROL 90 tablet 3  . promethazine-dextromethorphan (PROMETHAZINE-DM) 6.25-15 MG/5ML syrup Take 1 to 2 tsp enery 4 hours if needed for cough (Patient taking differently: Take 5-10 mLs by mouth every 4 (four) hours as needed. Take 1 to 2 tsp enery 4 hours if needed for cough) 360 mL 1  . zinc gluconate 50 MG tablet Take 50 mg by mouth daily.     No current facility-administered medications for this visit.     PHYSICAL EXAMINATION: ECOG PERFORMANCE STATUS: 0 - Asymptomatic  Vitals:   07/07/20 1018  BP: 116/87  Pulse: 80  Resp: 17  Temp: (!) 97 F (36.1 C)  SpO2: 100%   Filed Weights   07/07/20 1018  Weight: 162 lb 4.8 oz (73.6 kg)    GENERAL:alert, no distress and comfortable SKIN: skin color, texture, turgor are normal, no rashes or significant lesions EYES: normal, conjunctiva are pink and non-injected, sclera clear OROPHARYNX:no exudate, no erythema and lips, buccal mucosa, and tongue normal  NECK: supple, thyroid normal size, non-tender, without nodularity LYMPH:  no palpable lymphadenopathy in the cervical, axillary or inguinal LUNGS: clear to auscultation and percussion with normal breathing effort HEART: regular rate & rhythm and no murmurs and no lower extremity edema ABDOMEN:abdomen soft, non-tender and normal bowel sounds Musculoskeletal:no cyanosis of digits and no clubbing  PSYCH: alert & oriented x 3 with fluent speech NEURO: no focal motor/sensory deficits Nails: scab over both great toe nails, no concern for an infection.  LABORATORY DATA:  I have reviewed the data as listed Lab Results  Component Value Date   WBC 7.3 04/21/2020   HGB 12.7 04/21/2020   HCT 39.1 04/21/2020   MCV 92.4 04/21/2020   PLT 88 (L) 04/21/2020     Chemistry      Component Value Date/Time   NA 139 04/21/2020 1440   K 4.6 04/21/2020 1440   CL 104 04/21/2020 1440   CO2 27 04/21/2020 1440   BUN 25 (H) 04/21/2020 1440    CREATININE 1.36 (H) 04/21/2020 1440   CREATININE 1.12 (H) 03/19/2020 0835   CREATININE 1.28 (H) 02/25/2020 1649      Component Value  Date/Time   CALCIUM 10.1 04/21/2020 1440   ALKPHOS 68 03/19/2020 0835   AST 13 (L) 03/19/2020 0835   ALT 9 03/19/2020 0835   BILITOT 0.7 03/19/2020 0835     I have reviewed labs for the past 10 yrs. no thrombocytopenia in 2009.  There were no labs between 2009 and 2014.  Platelet count was 120,000 in 2014 and has progressively declined over the past several years. Slowly progressive thrombocytopenia over the past 5/6 yrs,  Labs from today are pending  RADIOGRAPHIC STUDIES: I have personally reviewed the radiological images as listed and agreed with the findings in the report. DG BONE DENSITY (DXA)  Result Date: 07/05/2020 EXAM: DUAL X-RAY ABSORPTIOMETRY (DXA) FOR BONE MINERAL DENSITY IMPRESSION: Referring Physician:  Allyn Kenner Your patient completed a BMD test using Lunar IDXA DXA system ( analysis version: 16 ) manufactured by [employer]. Technologist: KT PATIENT: Name: Jillian Rodgers, Jillian Rodgers Patient ID: [id number] Birth Date: June 15, 1945 Height: 63.0 in. Sex: Female Measured: 07/05/2020 Weight: 160.0 lbs. Indications: Advanced Age, Caucasian, Estrogen Deficient, Gabapentin, Height Loss (781.91), Hypothyroid, kidney disease, Postmenopausal, Synthroid Fractures: None Treatments: Vitamin D (E933.5) ASSESSMENT: The BMD measured at Forearm Radius 33% is 0.605 g/cm2 with a T-score of -3.2. This patient is considered osteoporotic according to [facility]) criteria. The scan quality is good. Lumbar spine was not utilized due to advanced degenerative changes. Site Region Measured Date Measured Age YA BMD Significant CHANGE T-score Left Forearm Radius 33% 07/05/2020 76.3 -3.2 0.605 g/cm2 DualFemur Neck Left 07/05/2020 76.3 -1.6 0.812 g/cm2 DualFemur Total Mean 07/05/2020 76.3 -1.4 0.829 g/cm2 World Health Organization [facility]) criteria for  post-menopausal, Caucasian Women: Normal       T-score at or above -1 SD Osteopenia   T-score between -1 and -2.5 SD Osteoporosis T-score at or below -2.5 SD RECOMMENDATION: 1. All patients should optimize calcium and vitamin D intake. 2. Consider FDA approved medical therapies in postmenopausal women and men aged 46 years and older, based on the following: a. A hip or vertebral (clinical or morphometric) fracture b. T- score < or = -2.5 at the femoral neck or spine after appropriate evaluation to exclude secondary causes c. Low bone mass (T-score between -1.0 and -2.5 at the femoral neck or spine) and a 10 year probability of a hip fracture > or = 3% or a 10 year probability of a major osteoporosis-related fracture > or = 20% based on the [place]-adapted WHO algorithm d. Clinician judgment and/or patient preferences may indicate treatment for people with 10-year fracture probabilities above or below these levels FOLLOW-UP: Patients with diagnosis of osteoporosis or at high risk for fracture should have regular bone mineral density tests. For patients eligible for Medicare, routine testing is allowed once every 2 years. The testing frequency can be increased to one year for patients who have rapidly progressing disease, those who are receiving or discontinuing medical therapy to restore bone mass, or have additional risk factors. [facility] Electronically Signed   By: Rolm Baptise M.D.   On: 07/05/2020 21:11   I have reviewed bone marrow biopsy reports. All questions were answered. The patient knows to call the clinic with any problems, questions or concerns.  I spent 30 minutes in the care of this patient including history and physical, review of records, counseling and coordination of care.    Benay Pike, MD 07/07/2020 10:48 AM

## 2020-07-10 ENCOUNTER — Encounter: Payer: Self-pay | Admitting: Internal Medicine

## 2020-07-10 NOTE — Progress Notes (Signed)
Annual Screening/Preventative Visit & Comprehensive Evaluation &  Examination      This very nice 75 y.o. MWF presents for a Screening /Preventative Visit & comprehensive evaluation and management of multiple medical co-morbidities.  Patient has been followed for HTN, HLD, Hypothyroidism, Prediabetes  and Vitamin D Deficiency.  Patient has GERD controlled on her Pantoprazole.       Patient recently dx'd with severe iron deficiency and rx'd  IV ferraheme by Dr Arletha Pili Iruku.        Patient is followed for labile HTN circa 1990's. Patient's BP has been controlled at home. Patient has been followed by Dr Corliss Parish for Kidney functions  in Stage 3 which have improved from 3b (GFR 40) to now 3a (GFR 58).  and patient denies any cardiac symptoms as chest pain, palpitations, shortness of breath, dizziness or ankle swelling. Today's BP is at goal - 116/80.       Patient's hyperlipidemia is controlled with diet and Pravastatin. Patient denies myalgias or other medication SE's. Last lipids were at goal:  Lab Results  Component Value Date   CHOL 149 01/12/2020   HDL 66 01/12/2020   LDL    CALC 64 01/12/2020   TRIG 105 01/12/2020   CHOL  HDL 2.3 01/12/2020       Patient has hx/o prediabetes predating  (A1c 6.0% /2012) and patient denies reactive hypoglycemic symptoms, visual blurring, diabetic polys or paresthesias. Last A1c was normal & at goal:  Lab Results  Component Value Date   HGBA1C 5.6 01/12/2020                                         Patient was dx'd with Hypothyroidism circa 1990's & has been on thyroid replacement since.                                                       Finally, patient has history of Vitamin D Deficiency ("39"/2012)  and last Vitamin D was at goal:  Lab Results  Component Value Date   VD25OH 96 01/12/2020    Current Outpatient Medications on File Prior to Visit  Medication Sig  . VITAMIN C  500 MG CAPS Take daily.   . cetirizine 10 MG  tablet Take daily.  [name] Kitchen VITAMIN D 5,000 Units  Take daily.  [name] Kitchen gabapentin  600 MG tablet Take 900 mg morning and 1200 mgbedtime  . levothyroxine  50 MCG tablet Take  1 tablet  Daily    . OnabotulinumtoxinA (BOTOX IJ) Inject 1 vial into the muscle every 8 (eight) weeks.   . pantoprazole  40 MG tablet Take  1 tablet  Daily  for Acid Indigestion & Heartburn  . pravastatin  40 MG tablet TAKE 1 TABLET AT BEDTIME  . promethazine-DM 6.25-15 MG/5ML syrup Take 1 to 2 tsp enery 4 hours if needed for cough   . zinc  50 MG tablet Take daily.    No Known Allergies   Past Medical History:  Diagnosis Date  . Anemia    Iron infusion on 04/19/2020   . Anemia with low platelet count ([place])   . Cancer (HCC)    HX OF SKIN CANCER   . Chronic kidney disease   .  Degenerative joint disease   . Dyspnea    wiht exertion   . GERD (gastroesophageal reflux disease)   . History of kidney stones   . Hyperlipidemia   . Hypertension    off of b/p meds due to blood pressure fluctuating - discontinued by DR Melford Aase   . Hypothyroidism   . Migraines    neurontin helps  . Pneumonia    hx of walking pneumonia   . Thyroid disease     Health Maintenance  Topic Date Due  . TETANUS/TDAP  11/11/2024  . INFLUENZA VACCINE  Completed  . DEXA SCAN  Completed  . COVID-19 Vaccine  Completed  . Hepatitis C Screening  Completed  . PNA vac Low Risk Adult  Completed  . HPV VACCINES  Aged Out    Immunization History  Administered Date(s) Administered  . Influenza Inj Mdck Quad 01/15/2018  . Influenza, High Dose Seasonal PF  02/07/2018, 12/30/2018, 02/12/2020  . Influenza-Unspecified 01/24/2011, 02/02/2012, 01/16/2015  . PFIZER  SARS-COV-2 Vaccination 05/12/2019, 06/02/2019, 01/22/2020  . Pneumococcal Conjugate-13 10/28/2014  . Pneumococcal- 23 11/28/2011  . Td 11/17/2010  . Tdap 11/12/2014  . Zoster 11/17/2010    Last Cologard : 09.25.2019 - Negative - due 3 year f/u Oct 2022   Last MGM : 03/09/2020  Last  dexaBMD :  T-3.2   Osteoporosis   Past Surgical History:  Procedure Laterality Date  . BILATERAL CATARACT SURGERY     . [place]   open  . COLONOSCOPY  2009   recommended 10 year f/u  . HERNIA REPAIR  1985   umb hernia rpr  . HERNIA REPAIR  2005 & 2009   ventral hernia rpr  . INGUINAL HERNIA REPAIR Left 04/30/2020   Procedure: OPEN LEFT INGUINAL HERNIA REPAIR WITH MESH;  Surgeon: Clovis Riley, MD;  Location: [facility];  Service: General;  Laterality: Left;  . PILONIDAL CYST EXCISION  1972  . SKIN CANCER REMOVAL     . TONSILLECTOMY    . UPPER GI ENDOSCOPY    . VENTRAL HERNIA REPAIR      Family History  Problem Relation Age of Onset  . Ovarian cancer Mother   . Heart disease Mother   . Cancer Mother   . Uterine cancer Mother   . Hypertension Mother   . Breast cancer Sister   . Stroke Paternal Uncle   . Congestive Heart Failure Father   . Gout Maternal Aunt   . Colon cancer Neg Hx   . Esophageal cancer Neg Hx   . Stomach cancer Neg Hx   . Rectal cancer Neg Hx     Social History   Tobacco Use  . Smoking status: Never Smoker  . Smokeless tobacco: Never Used  Vaping Use  . Vaping Use: Never used  Substance Use Topics  . Alcohol use: No  . Drug use: No    ROS Constitutional: Denies fever, chills, weight loss/gain, headaches, insomnia,  night sweats, and change in appetite. Does c/o fatigue. Eyes: Denies redness, blurred vision, diplopia, discharge, itchy, watery eyes.  ENT: Denies discharge, congestion, post nasal drip, epistaxis, sore throat, earache, hearing loss, dental pain, Tinnitus, Vertigo, Sinus pain, snoring.  Cardio: Denies chest pain, palpitations, irregular heartbeat, syncope, dyspnea, diaphoresis, orthopnea, PND, claudication, edema Respiratory: denies cough, dyspnea, DOE, pleurisy, hoarseness, laryngitis, wheezing.  Gastrointestinal: Denies dysphagia, heartburn, reflux, water brash, pain, cramps, nausea, vomiting,  bloating, diarrhea, constipation, hematemesis, melena, hematochezia, jaundice, hemorrhoids Genitourinary: Denies  dysuria, frequency, urgency, nocturia, hesitancy, discharge, hematuria, flank pain Breast: Breast lumps, nipple discharge, bleeding.  Musculoskeletal: Denies arthralgia, myalgia, stiffness, Jt. Swelling, pain, limp, and strain/sprain. Denies falls. Skin: Denies puritis, rash, hives, warts, acne, eczema, changing in skin lesion Neuro: No weakness, tremor, incoordination, spasms, paresthesia, pain Psychiatric: Denies confusion, memory loss, sensory loss. Denies Depression. Endocrine: Denies change in weight, skin, hair change, nocturia, and paresthesia, diabetic polys, visual blurring, hyper / hypo glycemic episodes.  Heme/Lymph: No excessive bleeding, bruising, enlarged lymph nodes.  Physical Exam  BP 116/80   Pulse 63   Temp 97.7 F (36.5 C)   Resp 16   Ht 5\' 3"  (1.6 m)   Wt 161 lb (73 kg)   SpO2 98%   BMI 28.52 kg/m   General Appearance: Well nourished, well groomed and in no apparent distress.  Eyes: PERRLA, EOMs, conjunctiva no swelling or erythema, normal fundi and vessels. Sinuses: No frontal/maxillary tenderness ENT/Mouth: EACs patent / TMs  nl. Nares clear without erythema, swelling, mucoid exudates. Oral hygiene is good. No erythema, swelling, or exudate. Tongue normal, non-obstructing. Tonsils not swollen or erythematous. Hearing normal.  Neck: Supple, thyroid not palpable. No bruits, nodes or JVD. Respiratory: Respiratory effort normal.  BS equal and clear bilateral without rales, rhonci, wheezing or stridor. Cardio: Heart sounds are normal with regular rate and rhythm and no murmurs, rubs or gallops. Peripheral pulses are normal and equal bilaterally without edema. No aortic or femoral bruits. Chest: symmetric with normal excursions and percussion. Breasts: Symmetric, without lumps, nipple discharge, retractions, or fibrocystic changes.  Abdomen: Flat, soft with  bowel sounds active. Nontender, no guarding, rebound, hernias, masses, or organomegaly.  Lymphatics: Non tender without lymphadenopathy.  Musculoskeletal: Full ROM all peripheral extremities, joint stability, 5/5 strength, and normal gait. Skin: Warm and dry without rashes, lesions, cyanosis, clubbing or  ecchymosis.  Neuro: Cranial nerves intact, reflexes equal bilaterally. Normal muscle tone, no cerebellar symptoms. Sensation intact.  Pysch: Alert and oriented x 3, normal affect, Insight and Judgment appropriate.   Assessment and Plan  1. Annual Preventative Screening Examination   2. Labile hypertension  - EKG 12-Lead - Urinalysis, Routine w reflex microscopic - Microalbumin / creatinine urine ratio - Magnesium  3. Hyperlipidemia, mixed  - Lipid panel  4. Abnormal glucose  - Hemoglobin A1c - Insulin, random  5. Vitamin D deficiency  - VITAMIN D 25 Hydroxy   6. Hypothyroidism   7. Iron deficiency anemia   8. Stage 3a chronic kidney disease (HCC)  - Urinalysis, Routine w reflex microscopic - Microalbumin / creatinine urine ratio  9. Anemia due to stage 3a chronic kidney disease ([place])   10. Gastroesophageal reflux disease   11. Screening for colorectal cancer   12. Screening for ischemic heart disease  - EKG 12-Lead  13. FHx: heart disease  - EKG 12-Lead  14. Medication management  - Magnesium - Lipid panel - Hemoglobin A1c - Insulin, random - VITAMIN D 25 Hydroxy        Patient was counseled in prudent diet to achieve/maintain BMI less than 25 for weight control, BP monitoring, regular exercise and medications. Discussed med's effects and SE's.  Patient had recent labs on 3/23 at Heme-Onc office for CBC, CMET & TSH , so will not repeat today, otherwise check routine screening labs with regular follow-up as recommended. Over 40 minutes of exam, counseling, chart review and high complex critical decision making was performed.   Kirtland Bouchard,  MD

## 2020-07-10 NOTE — Patient Instructions (Signed)

## 2020-07-12 ENCOUNTER — Encounter: Payer: Self-pay | Admitting: Internal Medicine

## 2020-07-12 ENCOUNTER — Other Ambulatory Visit: Payer: Self-pay

## 2020-07-12 ENCOUNTER — Ambulatory Visit: Payer: Medicare Other | Admitting: Internal Medicine

## 2020-07-12 VITALS — BP 116/80 | HR 63 | Temp 97.7°F | Resp 16 | Ht 63.0 in | Wt 161.0 lb

## 2020-07-12 DIAGNOSIS — M791 Myalgia, unspecified site: Secondary | ICD-10-CM | POA: Diagnosis not present

## 2020-07-12 DIAGNOSIS — D631 Anemia in chronic kidney disease: Secondary | ICD-10-CM

## 2020-07-12 DIAGNOSIS — R0989 Other specified symptoms and signs involving the circulatory and respiratory systems: Secondary | ICD-10-CM | POA: Diagnosis not present

## 2020-07-12 DIAGNOSIS — Z1211 Encounter for screening for malignant neoplasm of colon: Secondary | ICD-10-CM

## 2020-07-12 DIAGNOSIS — R7309 Other abnormal glucose: Secondary | ICD-10-CM

## 2020-07-12 DIAGNOSIS — D509 Iron deficiency anemia, unspecified: Secondary | ICD-10-CM

## 2020-07-12 DIAGNOSIS — G518 Other disorders of facial nerve: Secondary | ICD-10-CM | POA: Diagnosis not present

## 2020-07-12 DIAGNOSIS — Z79899 Other long term (current) drug therapy: Secondary | ICD-10-CM | POA: Diagnosis not present

## 2020-07-12 DIAGNOSIS — E782 Mixed hyperlipidemia: Secondary | ICD-10-CM | POA: Diagnosis not present

## 2020-07-12 DIAGNOSIS — N1831 Chronic kidney disease, stage 3a: Secondary | ICD-10-CM | POA: Diagnosis not present

## 2020-07-12 DIAGNOSIS — Z0000 Encounter for general adult medical examination without abnormal findings: Secondary | ICD-10-CM

## 2020-07-12 DIAGNOSIS — G43019 Migraine without aura, intractable, without status migrainosus: Secondary | ICD-10-CM | POA: Diagnosis not present

## 2020-07-12 DIAGNOSIS — Z8249 Family history of ischemic heart disease and other diseases of the circulatory system: Secondary | ICD-10-CM | POA: Diagnosis not present

## 2020-07-12 DIAGNOSIS — G43111 Migraine with aura, intractable, with status migrainosus: Secondary | ICD-10-CM | POA: Diagnosis not present

## 2020-07-12 DIAGNOSIS — K219 Gastro-esophageal reflux disease without esophagitis: Secondary | ICD-10-CM

## 2020-07-12 DIAGNOSIS — E559 Vitamin D deficiency, unspecified: Secondary | ICD-10-CM

## 2020-07-12 DIAGNOSIS — G43719 Chronic migraine without aura, intractable, without status migrainosus: Secondary | ICD-10-CM | POA: Diagnosis not present

## 2020-07-12 DIAGNOSIS — Z136 Encounter for screening for cardiovascular disorders: Secondary | ICD-10-CM | POA: Diagnosis not present

## 2020-07-12 DIAGNOSIS — Z0001 Encounter for general adult medical examination with abnormal findings: Secondary | ICD-10-CM

## 2020-07-12 DIAGNOSIS — M542 Cervicalgia: Secondary | ICD-10-CM | POA: Diagnosis not present

## 2020-07-12 DIAGNOSIS — E039 Hypothyroidism, unspecified: Secondary | ICD-10-CM

## 2020-07-13 ENCOUNTER — Other Ambulatory Visit: Payer: Self-pay | Admitting: Internal Medicine

## 2020-07-13 DIAGNOSIS — N3000 Acute cystitis without hematuria: Secondary | ICD-10-CM

## 2020-07-13 LAB — URINALYSIS, ROUTINE W REFLEX MICROSCOPIC
Bilirubin Urine: NEGATIVE
Glucose, UA: NEGATIVE
Hgb urine dipstick: NEGATIVE
Hyaline Cast: NONE SEEN /LPF
Nitrite: NEGATIVE
Specific Gravity, Urine: 1.027 (ref 1.001–1.03)
pH: <=5 (ref 5.0–8.0)

## 2020-07-13 LAB — LIPID PANEL
Cholesterol: 153 mg/dL (ref <200)
HDL: 67 mg/dL (ref >=50)
LDL Cholesterol (Calc): 63 mg/dL (calc)
Non-HDL Cholesterol (Calc): 86 mg/dL (calc) (ref <130)
Total CHOL/HDL Ratio: 2.3 (calc) (ref <5.0)
Triglycerides: 146 mg/dL (ref <150)

## 2020-07-13 LAB — HEMOGLOBIN A1C
Hgb A1c MFr Bld: 5.5 % of total Hgb (ref <5.7)
Mean Plasma Glucose: 111 mg/dL
eAG (mmol/L): 6.2 mmol/L

## 2020-07-13 LAB — MICROALBUMIN / CREATININE URINE RATIO
Creatinine, Urine: 175 mg/dL (ref 20–275)
Microalb Creat Ratio: 15 mcg/mg creat (ref <30)
Microalb, Ur: 2.6 mg/dL

## 2020-07-13 LAB — VITAMIN D 25 HYDROXY (VIT D DEFICIENCY, FRACTURES): Vit D, 25-Hydroxy: 83 ng/mL (ref 30–100)

## 2020-07-13 LAB — MAGNESIUM: Magnesium: 2.1 mg/dL (ref 1.5–2.5)

## 2020-07-13 LAB — INSULIN, RANDOM: Insulin: 8 u[IU]/mL

## 2020-07-13 NOTE — Progress Notes (Signed)
============================================================ -   Test results slightly outside the reference range are not unusual. If there is anything important, I will review this with you,  otherwise it is considered normal test values.  If you have further questions,  please do not hesitate to contact me at the office or via My Chart.  ============================================================ ============================================================  -  U/A suspicious for UTI - will request culture - may take 2 - 3 days to get back  ============================================================ ============================================================  -  Total chol = 153  and LDL = 63   -  Both   Excellent   - Very low risk for Heart Attack  / Stroke ============================================================ ============================================================  -  A1c = 5.5% - Normal - Great - No Diabetes  ! ============================================================ ============================================================  -  Vitamin D = 83 - Excellent  ============================================================ ============================================================  -  All Else - CBC - Kidneys - Electrolytes - Liver - Magnesium & Thyroid    - all  Normal / OK ============================================================  ============================================================  - Keep up the [place] work  !  ============================================================  ============================================================

## 2020-07-14 DIAGNOSIS — N3000 Acute cystitis without hematuria: Secondary | ICD-10-CM | POA: Diagnosis not present

## 2020-07-15 LAB — URINE CULTURE
MICRO NUMBER:: 11712715
SPECIMEN QUALITY:: ADEQUATE

## 2020-07-15 NOTE — Progress Notes (Signed)
============================================================ -   Test results slightly outside the reference range are not unusual. If there is anything important, I will review this with you,  otherwise it is considered normal test values.  If you have further questions,  please do not hesitate to contact me at the office or via My Chart.  ============================================================ ============================================================  -  Urine Culture returned OK - NO Infection - Great !  ============================================================ ============================================================

## 2020-07-26 DIAGNOSIS — M810 Age-related osteoporosis without current pathological fracture: Secondary | ICD-10-CM | POA: Diagnosis not present

## 2020-08-15 ENCOUNTER — Other Ambulatory Visit: Payer: Self-pay | Admitting: Internal Medicine

## 2020-08-15 DIAGNOSIS — E039 Hypothyroidism, unspecified: Secondary | ICD-10-CM

## 2020-08-25 DIAGNOSIS — G43111 Migraine with aura, intractable, with status migrainosus: Secondary | ICD-10-CM | POA: Diagnosis not present

## 2020-08-25 DIAGNOSIS — M791 Myalgia, unspecified site: Secondary | ICD-10-CM | POA: Diagnosis not present

## 2020-08-25 DIAGNOSIS — M542 Cervicalgia: Secondary | ICD-10-CM | POA: Diagnosis not present

## 2020-08-25 DIAGNOSIS — G43719 Chronic migraine without aura, intractable, without status migrainosus: Secondary | ICD-10-CM | POA: Diagnosis not present

## 2020-08-25 DIAGNOSIS — G43019 Migraine without aura, intractable, without status migrainosus: Secondary | ICD-10-CM | POA: Diagnosis not present

## 2020-08-25 DIAGNOSIS — G518 Other disorders of facial nerve: Secondary | ICD-10-CM | POA: Diagnosis not present

## 2020-09-02 DIAGNOSIS — L814 Other melanin hyperpigmentation: Secondary | ICD-10-CM | POA: Diagnosis not present

## 2020-09-02 DIAGNOSIS — L821 Other seborrheic keratosis: Secondary | ICD-10-CM | POA: Diagnosis not present

## 2020-09-02 DIAGNOSIS — D2261 Melanocytic nevi of right upper limb, including shoulder: Secondary | ICD-10-CM | POA: Diagnosis not present

## 2020-09-02 DIAGNOSIS — Z85828 Personal history of other malignant neoplasm of skin: Secondary | ICD-10-CM | POA: Diagnosis not present

## 2020-09-02 DIAGNOSIS — D1801 Hemangioma of skin and subcutaneous tissue: Secondary | ICD-10-CM | POA: Diagnosis not present

## 2020-09-30 ENCOUNTER — Other Ambulatory Visit: Payer: Self-pay

## 2020-09-30 ENCOUNTER — Inpatient Hospital Stay: Payer: Medicare PPO | Attending: Hematology and Oncology | Admitting: Hematology and Oncology

## 2020-09-30 ENCOUNTER — Encounter: Payer: Self-pay | Admitting: Hematology and Oncology

## 2020-09-30 ENCOUNTER — Inpatient Hospital Stay: Payer: Medicare PPO

## 2020-09-30 VITALS — BP 130/84 | HR 81 | Temp 97.7°F | Ht 63.0 in | Wt 162.9 lb

## 2020-09-30 DIAGNOSIS — E039 Hypothyroidism, unspecified: Secondary | ICD-10-CM | POA: Diagnosis not present

## 2020-09-30 DIAGNOSIS — D509 Iron deficiency anemia, unspecified: Secondary | ICD-10-CM

## 2020-09-30 DIAGNOSIS — D696 Thrombocytopenia, unspecified: Secondary | ICD-10-CM | POA: Diagnosis not present

## 2020-09-30 LAB — CMP (CANCER CENTER ONLY)
ALT: 14 U/L (ref 0–44)
AST: 18 U/L (ref 15–41)
Albumin: 3.8 g/dL (ref 3.5–5.0)
Alkaline Phosphatase: 81 U/L (ref 38–126)
Anion gap: 8 (ref 5–15)
BUN: 22 mg/dL (ref 8–23)
CO2: 23 mmol/L (ref 22–32)
Calcium: 10 mg/dL (ref 8.9–10.3)
Chloride: 110 mmol/L (ref 98–111)
Creatinine: 1.02 mg/dL — ABNORMAL HIGH (ref 0.44–1.00)
GFR, Estimated: 57 mL/min — ABNORMAL LOW (ref >60)
Glucose, Bld: 96 mg/dL (ref 70–99)
Potassium: 4.2 mmol/L (ref 3.5–5.1)
Sodium: 141 mmol/L (ref 135–145)
Total Bilirubin: 0.5 mg/dL (ref 0.3–1.2)
Total Protein: 6.5 g/dL (ref 6.5–8.1)

## 2020-09-30 LAB — CBC WITH DIFFERENTIAL/PLATELET
Abs Immature Granulocytes: 0.02 10*3/uL (ref 0.00–0.07)
Basophils Absolute: 0 10*3/uL (ref 0.0–0.1)
Basophils Relative: 1 %
Eosinophils Absolute: 0.1 10*3/uL (ref 0.0–0.5)
Eosinophils Relative: 2 %
HCT: 35.8 % — ABNORMAL LOW (ref 36.0–46.0)
Hemoglobin: 12.1 g/dL (ref 12.0–15.0)
Immature Granulocytes: 0 %
Lymphocytes Relative: 33 %
Lymphs Abs: 2.1 10*3/uL (ref 0.7–4.0)
MCH: 31.4 pg (ref 26.0–34.0)
MCHC: 33.8 g/dL (ref 30.0–36.0)
MCV: 93 fL (ref 80.0–100.0)
Monocytes Absolute: 0.7 10*3/uL (ref 0.1–1.0)
Monocytes Relative: 10 %
Neutro Abs: 3.4 10*3/uL (ref 1.7–7.7)
Neutrophils Relative %: 54 %
Platelets: 57 10*3/uL — ABNORMAL LOW (ref 150–400)
RBC: 3.85 MIL/uL — ABNORMAL LOW (ref 3.87–5.11)
RDW: 11.9 % (ref 11.5–15.5)
WBC: 6.3 10*3/uL (ref 4.0–10.5)
nRBC: 0 % (ref 0.0–0.2)

## 2020-09-30 LAB — TSH: TSH: 1.735 u[IU]/mL (ref 0.350–4.500)

## 2020-09-30 NOTE — Progress Notes (Signed)
[place] NOTE  Patient Care Team: Unk Pinto, [name], [name]  CHIEF COMPLAINTS/PURPOSE OF CONSULTATION:  Thrombocytopenia  ASSESSMENT & PLAN:   No problem-specific Assessment & Plan notes found for this encounter.  No orders of the defined types were placed in this encounter.  This is a 75 yr old female patient with PMH significant for HTN, dyslipidemia referred to hematology for evaluation and follow up of thrombocytopenia.   She has undergone evaluation, no clear evidence of nutritional deficiencies, hepatitis or hypothyroidism. No known Liver disease. BMB showed hypercellularity, no evidence of dyspoeisis, megakaryocytic hyperplasia. At this time, this could be an early bone marrow disorder vs ITP She doesn't have any evidence of severe thrombocytopenia. Platelets are stable at 57 K. At this time we have discussed about surveillance every 3 months with repeat labs unless she develops symptoms or signs of severe thrombocytopenia.  Given family history of ovarian cancer, I also recommended to consider genetic testing.  She wanted to think about this   HISTORY OF PRESENTING ILLNESS:  Jillian Rodgers 75 y.o. female is here because of thrombocytopenia.  History from initial visit  75 year old female patient with past medical history significant for hypertension, hypothyroidism, dyslipidemia referred to hematology for evaluation of thrombocytopenia. No nutritional deficiency No thyroid disorder Hepatitis panel non reactive BMB showed hypercellular marrow for age with trilineage hematopoiesis. Flow negative or any monoclonal B cell population Iron def noted. Cytogenetics normal.  Interim History  She is here for follow up on thrombocytopenia and IDA Since last visit, she had iron infusion and she felt better from fatigue stand point. Occasional  migraine. No bleeding complaints. No change in breathing No change in bowel habits or urinary habits.  REVIEW OF SYSTEMS:    Constitutional: Denies fevers, chills or abnormal night sweats Eyes: Denies blurriness of vision, double vision or watery eyes Ears, nose, mouth, throat, and face: Denies mucositis or sore throat Respiratory: Denies cough,  Cardiovascular: Denies palpitation, chest discomfort or lower extremity swelling Gastrointestinal:  Denies nausea, heartburn or change in bowel habits Skin: Denies abnormal skin rashes Lymphatics: Denies new lymphadenopathy or easy bruising Neurological:Denies numbness, tingling or new weaknesses Behavioral/Psych: Mood is stable, no new changes  All other systems were reviewed with the patient and are negative.  MEDICAL HISTORY:  Past Medical History:  Diagnosis Date   Anemia with low platelet count (HCC)    Degenerative joint disease    GERD (gastroesophageal reflux disease)    History of kidney stones    Hyperlipidemia    Hypertension    off of b/p meds due to blood pressure fluctuating - discontinued by DR Melford Aase    Hypothyroidism    Migraines    neurontin helps   Pneumonia    hx of walking pneumonia     SURGICAL HISTORY: Past Surgical History:  Procedure Laterality Date   BILATERAL CATARACT SURGERY      BLERPHOROPLASTY \     CHOLECYSTECTOMY  1996   open   COLONOSCOPY  2009   recommended 10 year f/u   [place]   umb hernia rpr   HERNIA REPAIR  2005 & 2009   ventral hernia rpr   INGUINAL HERNIA REPAIR Left 04/30/2020   Procedure: OPEN LEFT INGUINAL HERNIA REPAIR WITH MESH;  Surgeon: Clovis Riley, MD;  Location: [facility];  Service: General;  Laterality: Left;   [place]  SKIN CANCER REMOVAL      TONSILLECTOMY     UPPER GI ENDOSCOPY     VENTRAL HERNIA REPAIR      SOCIAL HISTORY: Social History   Socioeconomic History   Marital status: Married    Spouse name: Not on file    Number of children: 2   Years of education: 18   Highest education level: Not on file  Occupational History   Not on file  Tobacco Use   Smoking status: Never   Smokeless tobacco: Never  Vaping Use   Vaping Use: Never used  Substance and Sexual Activity   Alcohol use: No   Drug use: No   Sexual activity: Not on file  Other Topics Concern   Not on file  Social History Narrative   Not on file   Social Determinants of Health   Financial Resource Strain: Not on file  Food Insecurity: Not on file  Transportation Needs: Not on file  Physical Activity: Not on file  Stress: Not on file  Social Connections: Not on file  Intimate Partner Violence: Not on file    FAMILY HISTORY: Family History  Problem Relation Age of Onset   Ovarian cancer Mother    Heart disease Mother    Cancer Mother    Uterine cancer Mother    Hypertension Mother    Breast cancer Sister    Stroke Paternal Uncle    Congestive Heart Failure Father    Gout Maternal Aunt    Colon cancer Neg Hx    Esophageal cancer Neg Hx    Stomach cancer Neg Hx    Rectal cancer Neg Hx     ALLERGIES:  has No Known Allergies.  MEDICATIONS:  Current Outpatient Medications  Medication Sig Dispense Refill   Ascorbic Acid (VITAMIN C) 500 MG CAPS Take 500 mg by mouth daily.      cetirizine (ZYRTEC) 10 MG tablet Take 10 mg by mouth daily.     Cholecalciferol (VITAMIN D PO) Take 5,000 Units by mouth daily.     gabapentin (NEURONTIN) 600 MG tablet Take 900-1,200 mg by mouth See admin instructions. Take 900 mg by mouth in morning and 1200 mg at bedtime     levothyroxine (SYNTHROID) 50 MCG tablet Take  1 tablet  Daily  on an empty stomach with only water for 30 minutes & no Antacid meds, Calcium or Magnesium for 4 hours & avoid Biotin 90 tablet 3   OnabotulinumtoxinA (BOTOX IJ) Inject 1 vial into the muscle every 8 (eight) weeks.      pantoprazole (PROTONIX) 40 MG tablet Take  1 tablet  Daily  for Acid Indigestion & Heartburn 90  tablet 0   pravastatin (PRAVACHOL) 40 MG tablet TAKE 1 TABLET BY MOUTH AT BEDTIME FOR CHOLESTEROL 90 tablet 3   zinc gluconate 50 MG tablet Take 50 mg by mouth daily.     promethazine-dextromethorphan (PROMETHAZINE-DM) 6.25-15 MG/5ML syrup Take 1 to 2 tsp enery 4 hours if needed for cough (Patient not taking: Reported on 09/30/2020) 360 mL 1   No current facility-administered medications for this visit.     PHYSICAL EXAMINATION: ECOG PERFORMANCE STATUS: 0 - Asymptomatic  Vitals:   09/30/20 1401  BP: 130/84  Pulse: 81  Temp: 97.7 F (36.5 C)  SpO2: 100%   Filed Weights   09/30/20 1401  Weight: 162 lb 14.4 oz (73.9 kg)    GENERAL:alert, no distress and comfortable  Physical Exam Constitutional:      Appearance: She  is normal weight.  HENT:     Head: Normocephalic and atraumatic.     Mouth/Throat:     Mouth: Mucous membranes are moist.     Pharynx: Oropharynx is clear.  Cardiovascular:     Rate and Rhythm: Normal rate and regular rhythm.     Pulses: Normal pulses.     Heart sounds: Normal heart sounds.  Pulmonary:     Effort: Pulmonary effort is normal.     Breath sounds: Normal breath sounds.  Abdominal:     General: Abdomen is flat.     Palpations: Abdomen is soft.  Musculoskeletal:        General: No swelling or tenderness.     Cervical back: Normal range of motion and neck supple. No rigidity.  Skin:    General: Skin is warm and dry.  Neurological:     Mental Status: She is alert.  Psychiatric:        Mood and Affect: Mood normal.     LABORATORY DATA:  I have reviewed the data as listed Lab Results  Component Value Date   WBC 7.3 07/07/2020   HGB 12.8 07/07/2020   HCT 38.8 07/07/2020   MCV 94.6 07/07/2020   PLT 58 (L) 07/07/2020     Chemistry      Component Value Date/Time   NA 145 07/07/2020 1054   K 4.2 07/07/2020 1054   CL 109 07/07/2020 1054   CO2 25 07/07/2020 1054   BUN 26 (H) 07/07/2020 1054   CREATININE 1.01 (H) 07/07/2020 1054    CREATININE 1.28 (H) 02/25/2020 1649      Component Value Date/Time   CALCIUM 9.6 07/07/2020 1054   ALKPHOS 83 07/07/2020 1054   AST 18 07/07/2020 1054   ALT 21 07/07/2020 1054   BILITOT 0.6 07/07/2020 1054     I have reviewed labs for the past 10 yrs. no thrombocytopenia in 2009.  There were no labs between 2009 and 2014.  Platelet count was 120,000 in 2014 and has progressively declined over the past several years. Slowly progressive thrombocytopenia over the past 5/6 yrs,   RADIOGRAPHIC STUDIES: I have personally reviewed the radiological images as listed and agreed with the findings in the report. No results found.  I have reviewed bone marrow biopsy reports. All questions were answered. The patient knows to call the clinic with any problems, questions or concerns.  I spent 30 minutes in the care of this patient including history and physical, review of records, counseling and coordination of care. We have once again discussed about the reason fo follow up, causes of thrombocytopenia, symptoms and signs of low platelet count, need for follow up and surveillance recommendations.    Benay Pike, MD 09/30/2020 2:39 PM

## 2020-09-30 NOTE — Progress Notes (Signed)
[place] NOTE  Patient Care Team: Unk Pinto, [name], [name]  CHIEF COMPLAINTS/PURPOSE OF CONSULTATION:  Thrombocytopenia  ASSESSMENT & PLAN:   No problem-specific Assessment & Plan notes found for this encounter.  No orders of the defined types were placed in this encounter.   Given family history of ovarian cancer, I also recommended to consider genetic testing.  Jillian Rodgers would like to think about this.  HISTORY OF PRESENTING ILLNESS:  Jillian Rodgers 75 y.o. female is here because of thrombocytopenia.  History from initial visit  75 year old female patient with past medical history significant for hypertension, hypothyroidism, dyslipidemia referred to hematology for evaluation of thrombocytopenia.  Jillian Rodgers arrived to the appointment with her husband.  Jillian Rodgers denies any new health complaints.  Jillian Rodgers was supposed to have an inguinal hernia surgery on the left and as a part of preop work-up had some blood work which demonstrated thrombocytopenia and hence Jillian Rodgers was referred to hematology.  Jillian Rodgers tells me that Jillian Rodgers had some minor procedures this year but was never told that Jillian Rodgers had thrombocytopenia.  Jillian Rodgers denies any history of bleeding otherwise.  Jillian Rodgers denies any B symptoms.  No history of autoimmune diseases, new medications, hepatitis C or alcohol intake or known liver disease. Jillian Rodgers denies any change in breathing, bowel habits, hematochezia, melena, hematuria etc.  Jillian Rodgers does report some mild fatigue but attributes to lack of physical activity during the pandemic.  Family history quite significant for maternal family with ovarian cancer in mom, breast cancer in sister who also had thyroid cancer.  There are some of her cousins on maternal side who had breast cancer.  Jillian Rodgers denies any personal history of cancers. Jillian Rodgers denies any known liver diseases or alcohol intake.  No herbal  supplements.  Last Covid vaccination in October 2021.   Interim History  Jillian Rodgers is here for Follow up on thrombocytopenia and IDA Jillian Rodgers feels pretty much at baseline. No complaints today. No bleeding noted from gingiva or nose. No change in breathing, bowel or urinary habits. No new neurological complaints.  REVIEW OF SYSTEMS:    Constitutional: Denies fevers, chills or abnormal night sweats Eyes: Denies blurriness of vision, double vision or watery eyes Ears, nose, mouth, throat, and face: Denies mucositis or sore throat Respiratory: Denies cough,  Cardiovascular: Denies palpitation, chest discomfort or lower extremity swelling Gastrointestinal:  Denies nausea, heartburn or change in bowel habits Skin: Denies abnormal skin rashes Lymphatics: Denies new lymphadenopathy or easy bruising Neurological:Denies numbness, tingling or new weaknesses Behavioral/Psych: Mood is stable, no new changes  All other systems were reviewed with the patient and are negative.  MEDICAL HISTORY:  Past Medical History:  Diagnosis Date   Anemia with low platelet count (HCC)    Degenerative joint disease    GERD (gastroesophageal reflux disease)    History of kidney stones    Hyperlipidemia    Hypertension    off of b/p meds due to blood pressure fluctuating - discontinued by DR Melford Aase    Hypothyroidism    Migraines    neurontin helps   Pneumonia    hx of walking pneumonia     SURGICAL HISTORY: Past Surgical History:  Procedure Laterality Date   BILATERAL CATARACT SURGERY      BLERPHOROPLASTY \     CHOLECYSTECTOMY  1996   open   COLONOSCOPY  2009   recommended 10 year f/u   [place]  umb hernia rpr   HERNIA REPAIR  2005 & 2009   ventral hernia rpr   INGUINAL HERNIA REPAIR Left 04/30/2020   Procedure: OPEN LEFT INGUINAL HERNIA REPAIR WITH MESH;  Surgeon: Clovis Riley, MD;  Location: [facility];  Service: General;  Laterality: Left;   PILONIDAL CYST EXCISION  1972   SKIN  CANCER REMOVAL      TONSILLECTOMY     UPPER GI ENDOSCOPY     VENTRAL HERNIA REPAIR      SOCIAL HISTORY: Social History   Socioeconomic History   Marital status: Married    Spouse name: Not on file   Number of children: 2   Years of education: 18   Highest education level: Not on file  Occupational History   Not on file  Tobacco Use   Smoking status: Never   Smokeless tobacco: Never  Vaping Use   Vaping Use: Never used  Substance and Sexual Activity   Alcohol use: No   Drug use: No   Sexual activity: Not on file  Other Topics Concern   Not on file  Social History Narrative   Not on file   Social Determinants of Health   Financial Resource Strain: Not on file  Food Insecurity: Not on file  Transportation Needs: Not on file  Physical Activity: Not on file  Stress: Not on file  Social Connections: Not on file  Intimate Partner Violence: Not on file    FAMILY HISTORY: Family History  Problem Relation Age of Onset   Ovarian cancer Mother    Heart disease Mother    Cancer Mother    Uterine cancer Mother    Hypertension Mother    Breast cancer Sister    Stroke Paternal Uncle    Congestive Heart Failure Father    Gout Maternal Aunt    Colon cancer Neg Hx    Esophageal cancer Neg Hx    Stomach cancer Neg Hx    Rectal cancer Neg Hx     ALLERGIES:  has No Known Allergies.  MEDICATIONS:  Current Outpatient Medications  Medication Sig Dispense Refill   Ascorbic Acid (VITAMIN C) 500 MG CAPS Take 500 mg by mouth daily.      cetirizine (ZYRTEC) 10 MG tablet Take 10 mg by mouth daily.     Cholecalciferol (VITAMIN D PO) Take 5,000 Units by mouth daily.     gabapentin (NEURONTIN) 600 MG tablet Take 900-1,200 mg by mouth See admin instructions. Take 900 mg by mouth in morning and 1200 mg at bedtime     levothyroxine (SYNTHROID) 50 MCG tablet Take  1 tablet  Daily  on an empty stomach with only water for 30 minutes & no Antacid meds, Calcium or Magnesium for 4 hours &  avoid Biotin 90 tablet 3   OnabotulinumtoxinA (BOTOX IJ) Inject 1 vial into the muscle every 8 (eight) weeks.      pantoprazole (PROTONIX) 40 MG tablet Take  1 tablet  Daily  for Acid Indigestion & Heartburn 90 tablet 0   pravastatin (PRAVACHOL) 40 MG tablet TAKE 1 TABLET BY MOUTH AT BEDTIME FOR CHOLESTEROL 90 tablet 3   zinc gluconate 50 MG tablet Take 50 mg by mouth daily.     promethazine-dextromethorphan (PROMETHAZINE-DM) 6.25-15 MG/5ML syrup Take 1 to 2 tsp enery 4 hours if needed for cough (Patient not taking: Reported on 09/30/2020) 360 mL 1   No current facility-administered medications for this visit.     PHYSICAL EXAMINATION: ECOG PERFORMANCE STATUS: 0 -  Asymptomatic  Vitals:   09/30/20 1401  BP: 130/84  Pulse: 81  Temp: 97.7 F (36.5 C)  SpO2: 100%   Filed Weights   09/30/20 1401  Weight: 162 lb 14.4 oz (73.9 kg)    GENERAL:alert, no distress and comfortable SKIN: skin color, texture, turgor are normal, no rashes or significant lesions EYES: normal, conjunctiva are pink and non-injected, sclera clear OROPHARYNX:no exudate, no erythema and lips, buccal mucosa, and tongue normal  NECK: supple, thyroid normal size, non-tender, without nodularity LYMPH:  no palpable lymphadenopathy in the cervical, axillary or inguinal LUNGS: clear to auscultation and percussion with normal breathing effort HEART: regular rate & rhythm and no murmurs and no lower extremity edema ABDOMEN:abdomen soft, non-tender and normal bowel sounds Musculoskeletal:no cyanosis of digits and no clubbing  PSYCH: alert & oriented x 3 with fluent speech NEURO: no focal motor/sensory deficits Nails: scab over both great toe nails, no concern for an infection.  LABORATORY DATA:  I have reviewed the data as listed Lab Results  Component Value Date   WBC 7.3 07/07/2020   HGB 12.8 07/07/2020   HCT 38.8 07/07/2020   MCV 94.6 07/07/2020   PLT 58 (L) 07/07/2020     Chemistry      Component Value  Date/Time   NA 145 07/07/2020 1054   K 4.2 07/07/2020 1054   CL 109 07/07/2020 1054   CO2 25 07/07/2020 1054   BUN 26 (H) 07/07/2020 1054   CREATININE 1.01 (H) 07/07/2020 1054   CREATININE 1.28 (H) 02/25/2020 1649      Component Value Date/Time   CALCIUM 9.6 07/07/2020 1054   ALKPHOS 83 07/07/2020 1054   AST 18 07/07/2020 1054   ALT 21 07/07/2020 1054   BILITOT 0.6 07/07/2020 1054     I have reviewed labs for the past 10 yrs. no thrombocytopenia in 2009.  There were no labs between 2009 and 2014.  Platelet count was 120,000 in 2014 and has progressively declined over the past several years. Slowly progressive thrombocytopenia over the past 5/6 yrs,  Labs from today are pending  RADIOGRAPHIC STUDIES: I have personally reviewed the radiological images as listed and agreed with the findings in the report. No results found.  I have reviewed bone marrow biopsy reports. All questions were answered. The patient knows to call the clinic with any problems, questions or concerns.  I spent 30 minutes in the care of this patient including history and physical, review of records, counseling and coordination of care.    Benay Pike, MD 09/30/2020 2:42 PM

## 2020-10-06 DIAGNOSIS — M542 Cervicalgia: Secondary | ICD-10-CM | POA: Diagnosis not present

## 2020-10-06 DIAGNOSIS — G43719 Chronic migraine without aura, intractable, without status migrainosus: Secondary | ICD-10-CM | POA: Diagnosis not present

## 2020-10-06 DIAGNOSIS — M791 Myalgia, unspecified site: Secondary | ICD-10-CM | POA: Diagnosis not present

## 2020-10-06 DIAGNOSIS — G43111 Migraine with aura, intractable, with status migrainosus: Secondary | ICD-10-CM | POA: Diagnosis not present

## 2020-10-06 DIAGNOSIS — G43019 Migraine without aura, intractable, without status migrainosus: Secondary | ICD-10-CM | POA: Diagnosis not present

## 2020-10-06 DIAGNOSIS — G518 Other disorders of facial nerve: Secondary | ICD-10-CM | POA: Diagnosis not present

## 2020-10-19 NOTE — Progress Notes (Signed)
MEDICARE ANNUAL WELLNESS VISIT AND FOLLOW UP  Assessment:   Diagnoses and all orders for this visit:  Encounter for Medicare annual wellness exam Due annually   Essential hypertension At goal; continue medication Monitor blood pressure at home; call if consistently over 130/80 Continue DASH diet.   Reminder to go to the ER if any CP, SOB, nausea, dizziness, severe HA, changes vision/speech, left arm numbness and tingling and jaw pain.  Gastroesophageal reflux disease, esophagitis presence not specified Well managed on current medications; has failed PPI taper in the past; gastritis per last EGD, continue PPI Discussed diet, avoiding triggers and other lifestyle changes  Hypothyroidism, unspecified type continue medications the same pending lab results  reminded to take on an empty stomach 30-38mns before food.  check TSH level  Osteoarthritis, unspecified osteoarthritis type, unspecified site Intermittent; managed by OTC analgesics; established with orthopedics  CKD Stage 3 (HCC) Increase fluids, avoid NSAIDS, monitor sugars, will monitor at each visit CMP/GFR Dr. GMoshe Ciprofollowing  Vitamin D deficiency At goal at recent check; continue to recommend supplementation for goal of 70-100 Defer vitamin D level  Upper airway cough syndrome Continue with acid reflux medications  Other abnormal glucose  Recent A1Cs at goal Discussed diet/exercise, weight management  Defer A1C to annual check; check CMP  Overweight (BMI 25.0-29.9) Long discussion about weight loss, diet, and exercise Recommended diet heavy in fruits and veggies and low in animal meats, cheeses, and dairy products, appropriate calorie intake Discussed appropriate weight for height  Follow up at next visit  Medication management Review at each visit  Mixed hyperlipidemia Continue medications Continue low cholesterol diet and exercise.  Check lipid panel.   Thrombocytopenia ([facility]  worked up - unclear etiology Monitor CBC, bleeding risks discussed with trauma or surgery  May need platelets prior to planned surgeries - will defer recommendations to hematology  Abnormal mammogram R breast cysts stable on last mammogram Resumed annual screening mammogram  GYN follows  S/p L inguinal hernia surgery  Doing well, no concerns  Osteoporosis  Limited to forearm, hips stable from last check in 2017 Strong patient preference to defer alendronate Discussed calcium - diet due to kidney hx, continue vit D, suggested K2 Encouraged resistance exercises - pilates, resistance bands Plan to recheck DEXA in 2 years  Increased urinary frequency Has seen GYN, several month duration Check UA?culture, discussed lifestyle, avoid irritants, stop fluids 2 hours prior to bedtime  If labs negative try oxybutynin 5 mg at bedtime for OAB sx  Orders Placed This Encounter  Procedures   CBC with Differential/Platelet   COMPLETE METABOLIC PANEL WITH GFR   Magnesium   Lipid panel   Urinalysis w microscopic + reflex cultur     Over 40 minutes of exam, counseling, chart review and critical decision making was performed Future Appointments  Date Time Provider DTrigg 12/30/2020 12:00 PM [facility] None  12/30/2020 12:40 PM [place], PArletha Pili MD [facility] None  01/24/2021  2:30 PM CLiane Comber NP [facility] None  07/18/2021  2:00 PM MUnk Pinto MD [facility] None  10/20/2021  2:30 PM CLiane Comber NP [facility] None     Plan:   During the course of the visit the patient was educated and counseled about appropriate screening and preventive services including:   Pneumococcal vaccine  Prevnar 13 Influenza vaccine Td vaccine Screening electrocardiogram Bone densitometry screening Colorectal cancer screening Diabetes screening Glaucoma screening Nutrition counseling  Advanced directives: requested   Subjective:  [name]  is a  75 y.o. female who presents for Medicare Annual Wellness Visit and 3 month follow up.   Today she is concerned about urinary frequency, ongoing intermittently for several months, just in the evening but waking her up more and not making it to the bathroom on occasion. Has been wearing panty liner. Denies burning/urine character change. Denies pelvic pressure or hx of prolapse or cystocele. She follows annually with GYN.   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 She was prescribed fosamax but prefers not to start unless absolutely necessary due to SE risk.   Patient has GERD, failed trial of transition of Protonix to Ranitidine alone and currently is controlled on Protonix/am. She underwent EGD 03/2018 which showed gastritis without evidence of dysplasia, malignancy or h. Pylori.  L inguinal hernia was repaired Dr. Kae Heller in Jan 2022, patient reports no issues, doing well.   She has R hammer toe surgery Sept 9th 2021 with Dr. Geroge Rodgers, still some trouble but improved.   She is followed by neurology for headaches/migraines and getting botox shots, much improved.   BMI is Body mass index is 28.87 kg/m., she has not been working on diet and exercise, geneally active around home but not oing to gym since covid 19.  Wt Readings from Last 3 Encounters:  10/20/20 163 lb (73.9 kg)  09/30/20 162 lb 14.4 oz (73.9 kg)  07/12/20 161 lb (73 kg)   Was having frequent near syncopal episodes, reports resolved spontaneously and none this last year   Her blood pressure has been controlled at home, today their BP is BP: 110/70 She does not workout. She denies chest pain, shortness of breath, dizziness.   She is on cholesterol medication (pravastatin 40 mg daily) and denies myalgias. Her cholesterol is at goal. The cholesterol last visit was:   Lab Results  Component Value Date   CHOL 153 07/12/2020   HDL 67 07/12/2020   LDLCALC 63 07/12/2020    TRIG 146 07/12/2020   CHOLHDL 2.3 07/12/2020    She has not been working on diet and exercise for glucose management, and denies foot ulcerations, increased appetite, nausea, paresthesia of the feet, polydipsia, polyuria, visual disturbances, vomiting and weight loss. Last A1C in the office was:  Lab Results  Component Value Date   HGBA1C 5.5 07/12/2020   She is on thyroid medication. Her medication was not changed last visit.  50 mcg daily Lab Results  Component Value Date   TSH 1.735 09/30/2020   She is followed by Dr. Corliss Parish. Follows for renal cysts, stable on recent MRI. Also CKD III. Last GFR: Lab Results  Component Value Date   GFRNONAA 57 (L) 09/30/2020   GFRNONAA 58 (L) 07/07/2020   GFRNONAA 40 (L) 04/21/2020   Patient is on Vitamin D supplement and at goal at recent check:    Lab Results  Component Value Date   VD25OH 83 07/12/2020      She has persistent stable thrombocytopenia for many years, recently working up by hematology Dr. Chryl Heck, had bone marrow biopsy but unremarkable.  CBC Latest Ref Rng & Units 09/30/2020 07/07/2020 04/21/2020  WBC 4.0 - 10.5 K/uL 6.3 7.3 7.3  Hemoglobin 12.0 - 15.0 g/dL 12.1 12.8 12.7  Hematocrit 36.0 - 46.0 % 35.8(L) 38.8 39.1  Platelets 150 - 400 K/uL 57(L) 58(L) 88(L)   She had iron def anemia following surgery which has resolved.  Lab Results  Component Value  Date   IRON 93 07/07/2020   TIBC 253 07/07/2020   FERRITIN 240 07/07/2020      Medication Review: Current Outpatient Medications on File Prior to Visit  Medication Sig Dispense Refill   Ascorbic Acid (VITAMIN C) 500 MG CAPS Take 500 mg by mouth daily.      cetirizine (ZYRTEC) 10 MG tablet Take 10 mg by mouth daily.     Cholecalciferol (VITAMIN D PO) Take 5,000 Units by mouth daily.     gabapentin (NEURONTIN) 600 MG tablet Take 900-1,200 mg by mouth See admin instructions. Take 900 mg by mouth in morning and 1200 mg at bedtime     levothyroxine (SYNTHROID) 50 MCG  tablet Take  1 tablet  Daily  on an empty stomach with only water for 30 minutes & no Antacid meds, Calcium or Magnesium for 4 hours & avoid Biotin 90 tablet 3   OnabotulinumtoxinA (BOTOX IJ) Inject 1 vial into the muscle every 8 (eight) weeks.      pantoprazole (PROTONIX) 40 MG tablet Take  1 tablet  Daily  for Acid Indigestion & Heartburn 90 tablet 0   pravastatin (PRAVACHOL) 40 MG tablet TAKE 1 TABLET BY MOUTH AT BEDTIME FOR CHOLESTEROL 90 tablet 3   zinc gluconate 50 MG tablet Take 50 mg by mouth daily.     No current facility-administered medications on file prior to visit.    No Known Allergies  Current Problems (verified) Patient Active Problem List   Diagnosis Date Noted   Increased urinary frequency 10/20/2020   IDA (iron deficiency anemia) 04/07/2020   Inguinal hernia 02/19/2019   Thrombocytopenia ([place]) 11/25/2015   Encounter for Medicare annual wellness exam 06/14/2015   Overweight (BMI 25.0-29.9) 01/28/2015   CKD (chronic kidney disease) stage 3, GFR 30-59 ml/min (HCC) 01/19/2014   Upper airway cough syndrome 12/16/2013   Vitamin D deficiency 05/21/2013   Medication management 05/21/2013   Labile hypertension 01/31/2013   Degenerative joint disease    GERD (gastroesophageal reflux disease)    Hyperlipidemia    Hypothyroidism     Screening Tests Immunization History  Administered Date(s) Administered   Influenza Inj Mdck Quad With Preservative 01/15/2018   Influenza, High Dose Seasonal PF 12/29/2013, 01/15/2015, 02/01/2016, 02/28/2017, 02/07/2018, 12/30/2018, 02/12/2020   Influenza-Unspecified 01/24/2011, 02/02/2012, 01/16/2015   PFIZER(Purple Top)SARS-COV-2 Vaccination 05/12/2019, 06/02/2019, 01/22/2020, 07/23/2020   Pneumococcal Conjugate-13 10/28/2014   Pneumococcal-Unspecified 11/28/2011   Td 11/17/2010   Tdap 11/12/2014   Zoster, Live 11/17/2010    Preventative care: Last colonoscopy: 2009 Last cologuard: 12/2017 DUE 12/2020 - order at next  appointment Last EGD: 03/2018  Last mammogram: 02/2020 - does at GYN - Dr. Rogue Bussing, has abnormal R cluster of cysts stable - resume annual screening mammograms DEXA:03/2016 L fem T -1.6, had in 06/2020, osteoporosis limited to L forearm, T -3.2, hip stable/unchanged from 2017 at T - 1.6, patient preference to defer fosamax, will recheck in 2 years  Prior vaccinations: TD or Tdap: 2016  Influenza: 12/2019 Pneumococcal: 2013 Prevnar13: 2016 Shingles/Zostavax: 2012 - check with insurance about shingrix  Covid 19: 2/2, 2021, pfizer + 2 boosters  Names of Other Physician/Practitioners you currently use: 1. [facility]- here for primary care 2. Dr. Gershon Crane, eye doctor, last visit 2021, has upcoming scheduled Nov 3. Dr. Mathis Fare dentist, last visit 2022, goes q82m Patient Care Team: MUnk Pinto MD as PCP - General (Internal Medicine) GCorliss Parish [name]  SURGICAL HISTORY She  has a  past surgical history that includes Ventral hernia repair; Cholecystectomy (1996); Pilonidal cyst excision (1972); Tonsillectomy; Upper gi endoscopy; Colonoscopy (2009); Hernia repair (1985); Hernia repair (2005 & 2009); SKIN CANCER REMOVAL ; BILATERAL CATARACT SURGERY ; BLERPHOROPLASTY \; and Inguinal hernia repair (Left, 04/30/2020). FAMILY HISTORY Her family history includes Breast cancer in her sister; Cancer in her mother; Congestive Heart Failure in her father; Gout in her maternal aunt; Heart disease in her mother; Hypertension in her mother; Ovarian cancer in her mother; Stroke in her paternal uncle; Uterine cancer in her mother. SOCIAL HISTORY She  reports that she has never smoked. She has never used smokeless tobacco. She reports that she does not drink alcohol and does not use drugs.   MEDICARE WELLNESS OBJECTIVES: Physical activity: Current Exercise Habits: The patient does not participate in regular exercise at  present, Exercise limited by: None identified Cardiac risk factors: Cardiac Risk Factors include: advanced age (>70mn, >>85women);dyslipidemia;hypertension;sedentary lifestyle Depression/mood screen:   [facility]  Decreased Interest 0  Down, Depressed, Hopeless 0  PHQ - 2 Score 0  Some recent data might be hidden    ADLs:  In your present state of health, do you have any difficulty performing the following activities: 10/20/2020 07/10/2020  Hearing? N N  Vision? N N  Difficulty concentrating or making decisions? N N  Walking or climbing stairs? N N  Dressing or bathing? N N  Doing errands, shopping? N N  Some recent data might be hidden     Cognitive Testing  Alert? Yes  Normal Appearance?Yes  Oriented to person? Yes  Place? Yes   Time? Yes  Recall of three objects?  Yes  Can perform simple calculations? Yes  Displays appropriate judgment?Yes  Can read the correct time from a watch face?Yes  EOL planning: Does Patient Have a Medical Advance Directive?: Yes Type of Advance Directive: Healthcare Power of Attorney, Living will Does patient want to make changes to medical advance directive?: No - Patient declined Copy of HRobertsonin Chart?: No - copy requested     Review of Systems  Constitutional:  Negative for malaise/fatigue and weight loss.  HENT:  Negative for hearing loss and tinnitus.   Eyes:  Negative for blurred vision and double vision.  Respiratory:  Negative for cough, sputum production, shortness of breath and wheezing.   Cardiovascular:  Negative for chest pain, palpitations, orthopnea, claudication, leg swelling and PND.  Gastrointestinal:  Negative for abdominal pain, blood in stool, constipation, diarrhea, heartburn, melena, nausea and vomiting.  Genitourinary:  Positive for frequency and urgency. Negative for dysuria, flank pain and hematuria.  Musculoskeletal:  Negative for falls, joint pain and myalgias.  Skin:   Negative for rash.  Neurological:  Positive for headaches (chronic, not new, getting botox injections). Negative for dizziness, tingling, sensory change, loss of consciousness and weakness.  Endo/Heme/Allergies:  Negative for polydipsia.  Psychiatric/Behavioral: Negative.  Negative for depression, memory loss, substance abuse and suicidal ideas. The patient is not nervous/anxious and does not have insomnia.   All other systems reviewed and are negative.   Objective:     Today's Vitals   10/20/20 1443  BP: 110/70  Pulse: 70  Temp: (!) 97.3 F (36.3 C)  SpO2: 95%  Weight: 163 lb (73.9 kg)  Height: '5\' 3"'  (1.6 m)   Body mass index is 28.87 kg/m.  General appearance: alert, no distress, WD/WN,  female HEENT: normocephalic, sclerae anicteric, TMs pearly, nares patent, no discharge or erythema,  pharynx normal Oral cavity: MMM, no lesions Neck: supple, no lymphadenopathy, no thyromegaly, no masses Heart: RRR, normal S1, S2, no murmurs Lungs: CTA bilaterally, no wheezes, rhonchi, or rales Abdomen: +bs, soft, LLQ/groin tenderness with easily reduced inguinal hernia, non distended, no masses, no hepatomegaly, no splenomegaly Musculoskeletal: nontender, no swelling, no obvious deformity Extremities: no edema, no cyanosis, no clubbing Pulses: 2+ symmetric, upper and lower extremities, normal cap refill Neurological: alert, oriented x 3, CN2-12 intact, strength normal upper extremities and lower extremities, sensation normal throughout, DTRs 2+ throughout, no cerebellar signs, gait normal Psychiatric: normal affect, behavior normal, pleasant    Medicare Attestation I have personally reviewed: The patient's medical and social history Their use of alcohol, tobacco or illicit drugs Their current medications and supplements The patient's functional ability including ADLs,fall risks, home safety risks, cognitive, and hearing and visual impairment Diet and physical activities Evidence for  depression or mood disorders  The patient's weight, height, BMI, and visual acuity have been recorded in the chart.  I have made referrals, counseling, and provided education to the patient based on review of the above and I have provided the patient with a written personalized care plan for preventive services.     Izora Ribas, NP   10/20/2020

## 2020-10-20 ENCOUNTER — Encounter: Payer: Self-pay | Admitting: Adult Health

## 2020-10-20 ENCOUNTER — Other Ambulatory Visit: Payer: Self-pay

## 2020-10-20 ENCOUNTER — Other Ambulatory Visit: Payer: Self-pay | Admitting: Adult Health

## 2020-10-20 ENCOUNTER — Ambulatory Visit: Payer: Medicare PPO | Admitting: Adult Health

## 2020-10-20 VITALS — BP 110/70 | HR 70 | Temp 97.3°F | Ht 63.0 in | Wt 163.0 lb

## 2020-10-20 DIAGNOSIS — K219 Gastro-esophageal reflux disease without esophagitis: Secondary | ICD-10-CM

## 2020-10-20 DIAGNOSIS — R058 Other specified cough: Secondary | ICD-10-CM

## 2020-10-20 DIAGNOSIS — D696 Thrombocytopenia, unspecified: Secondary | ICD-10-CM | POA: Diagnosis not present

## 2020-10-20 DIAGNOSIS — R6889 Other general symptoms and signs: Secondary | ICD-10-CM | POA: Diagnosis not present

## 2020-10-20 DIAGNOSIS — M1990 Unspecified osteoarthritis, unspecified site: Secondary | ICD-10-CM | POA: Diagnosis not present

## 2020-10-20 DIAGNOSIS — R350 Frequency of micturition: Secondary | ICD-10-CM | POA: Diagnosis not present

## 2020-10-20 DIAGNOSIS — K4090 Unilateral inguinal hernia, without obstruction or gangrene, not specified as recurrent: Secondary | ICD-10-CM

## 2020-10-20 DIAGNOSIS — M816 Localized osteoporosis [Lequesne]: Secondary | ICD-10-CM

## 2020-10-20 DIAGNOSIS — E782 Mixed hyperlipidemia: Secondary | ICD-10-CM

## 2020-10-20 DIAGNOSIS — R0989 Other specified symptoms and signs involving the circulatory and respiratory systems: Secondary | ICD-10-CM

## 2020-10-20 DIAGNOSIS — E039 Hypothyroidism, unspecified: Secondary | ICD-10-CM

## 2020-10-20 DIAGNOSIS — Z79899 Other long term (current) drug therapy: Secondary | ICD-10-CM | POA: Diagnosis not present

## 2020-10-20 DIAGNOSIS — Z0000 Encounter for general adult medical examination without abnormal findings: Secondary | ICD-10-CM

## 2020-10-20 DIAGNOSIS — E559 Vitamin D deficiency, unspecified: Secondary | ICD-10-CM

## 2020-10-20 DIAGNOSIS — Z0001 Encounter for general adult medical examination with abnormal findings: Secondary | ICD-10-CM

## 2020-10-20 DIAGNOSIS — E663 Overweight: Secondary | ICD-10-CM

## 2020-10-20 DIAGNOSIS — D509 Iron deficiency anemia, unspecified: Secondary | ICD-10-CM

## 2020-10-20 DIAGNOSIS — N183 Chronic kidney disease, stage 3 unspecified: Secondary | ICD-10-CM | POA: Diagnosis not present

## 2020-10-20 MED ORDER — OXYBUTYNIN CHLORIDE 5 MG PO TABS: 5.0000 mg | ORAL_TABLET | Freq: Three times a day (TID) | ORAL | 0 refills | Status: DC | PRN

## 2020-10-20 NOTE — Patient Instructions (Addendum)
Try switching to vitamin D with K2  Increase calcium in diet - this is generally safe for kidneys   Check with insurance about shingrix  Try oxybutynin 1 tab at bedtime for bladder - let me know how you are are doing       Zoster Vaccine, Recombinant injection What is this medication? ZOSTER VACCINE (ZOS ter vak SEEN) is a vaccine used to reduce the risk of getting shingles. This vaccine is not used to treat shingles or nerve pain fromshingles. This medicine may be used for other purposes; ask your health care provider orpharmacist if you have questions. COMMON BRAND NAME(S): [facility] What should I tell my care team before I take this medication? They need to know if you have any of these conditions: cancer immune system problems an unusual or allergic reaction to Zoster vaccine, other medications, foods, dyes, or preservatives pregnant or trying to get pregnant breast-feeding How should I use this medication? This vaccine is injected into a muscle. It is given by a health care provider. A copy of Vaccine Information Statements will be given before each vaccination. Be sure to read this information carefully each time. This sheet may changeoften. Talk to your health care provider about the use of this vaccine in children.This vaccine is not approved for use in children. Overdosage: If you think you have taken too much of this medicine contact apoison control center or emergency room at once. NOTE: This medicine is only for you. Do not share this medicine with others. What if I miss a dose? Keep appointments for follow-up (booster) doses. It is important not to miss your dose. Call your health care provider if you are unable to keep anappointment. What may interact with this medication? medicines that suppress your immune system medicines to treat cancer steroid medicines like prednisone or cortisone This list may not describe all possible interactions. Give your health care  provider a list of all the medicines, herbs, non-prescription drugs, or dietary supplements you use. Also tell them if you smoke, drink alcohol, or use illegaldrugs. Some items may interact with your medicine. What should I watch for while using this medication? Visit your health care provider regularly. This vaccine, like all vaccines, may not fully protect everyone. What side effects may I notice from receiving this medication? Side effects that you should report to your doctor or health care professionalas soon as possible: allergic reactions (skin rash, itching or hives; swelling of the face, lips, or tongue) trouble breathing Side effects that usually do not require medical attention (report these toyour doctor or health care professional if they continue or are bothersome): chills headache fever nausea pain, redness, or irritation at site where injected tiredness vomiting This list may not describe all possible side effects. Call your doctor for medical advice about side effects. You may report side effects to FDA at1-800-FDA-1088. Where should I keep my medication? This vaccine is only given by a health care provider. It will not be stored athome. NOTE: This sheet is a summary. It may not cover all possible information. If you have questions about this medicine, talk to your doctor, pharmacist, orhealth care provider.  2022 Elsevier/Gold Standard (2019-05-09 16:23:07)

## 2020-10-21 ENCOUNTER — Encounter: Payer: Self-pay | Admitting: Adult Health

## 2020-10-21 LAB — CBC WITH DIFFERENTIAL/PLATELET
Absolute Monocytes: 618 cells/uL (ref 200–950)
Basophils Absolute: 39 cells/uL (ref 0–200)
Basophils Relative: 0.6 %
Eosinophils Absolute: 98 cells/uL (ref 15–500)
Eosinophils Relative: 1.5 %
HCT: 39.5 % (ref 35.0–45.0)
Hemoglobin: 12.9 g/dL (ref 11.7–15.5)
Lymphs Abs: 1911 cells/uL (ref 850–3900)
MCH: 30.7 pg (ref 27.0–33.0)
MCHC: 32.7 g/dL (ref 32.0–36.0)
MCV: 94 fL (ref 80.0–100.0)
MPV: 12 fL (ref 7.5–12.5)
Monocytes Relative: 9.5 %
Neutro Abs: 3835 cells/uL (ref 1500–7800)
Neutrophils Relative %: 59 %
Platelets: 63 10*3/uL — ABNORMAL LOW (ref 140–400)
RBC: 4.2 10*6/uL (ref 3.80–5.10)
RDW: 12.2 % (ref 11.0–15.0)
Total Lymphocyte: 29.4 %
WBC: 6.5 10*3/uL (ref 3.8–10.8)

## 2020-10-21 LAB — URINALYSIS W MICROSCOPIC + REFLEX CULTURE
Bacteria, UA: NONE SEEN /HPF
Bilirubin Urine: NEGATIVE
Glucose, UA: NEGATIVE
Hgb urine dipstick: NEGATIVE
Hyaline Cast: NONE SEEN /LPF
Leukocyte Esterase: NEGATIVE
Nitrites, Initial: NEGATIVE
Protein, ur: NEGATIVE
RBC / HPF: NONE SEEN /HPF (ref 0–2)
Specific Gravity, Urine: 1.025 (ref 1.001–1.035)
Squamous Epithelial / HPF: NONE SEEN /HPF (ref <=5)
WBC, UA: NONE SEEN /HPF (ref 0–5)
pH: <=5 (ref 5.0–8.0)

## 2020-10-21 LAB — LIPID PANEL
Cholesterol: 177 mg/dL (ref <200)
HDL: 63 mg/dL (ref >=50)
LDL Cholesterol (Calc): 84 mg/dL (calc)
Non-HDL Cholesterol (Calc): 114 mg/dL (calc) (ref <130)
Total CHOL/HDL Ratio: 2.8 (calc) (ref <5.0)
Triglycerides: 210 mg/dL — ABNORMAL HIGH (ref <150)

## 2020-10-21 LAB — COMPLETE METABOLIC PANEL WITH GFR
AG Ratio: 1.8 (calc) (ref 1.0–2.5)
ALT: 15 U/L (ref 6–29)
AST: 16 U/L (ref 10–35)
Albumin: 4.1 g/dL (ref 3.6–5.1)
Alkaline phosphatase (APISO): 86 U/L (ref 37–153)
BUN/Creatinine Ratio: 21 (calc) (ref 6–22)
BUN: 25 mg/dL (ref 7–25)
CO2: 25 mmol/L (ref 20–32)
Calcium: 9.5 mg/dL (ref 8.6–10.4)
Chloride: 109 mmol/L (ref 98–110)
Creat: 1.17 mg/dL — ABNORMAL HIGH (ref 0.60–0.93)
GFR, Est African American: 52 mL/min/{1.73_m2} — ABNORMAL LOW (ref >=60)
GFR, Est Non African American: 45 mL/min/{1.73_m2} — ABNORMAL LOW (ref >=60)
Globulin: 2.3 g/dL (calc) (ref 1.9–3.7)
Glucose, Bld: 99 mg/dL (ref 65–99)
Potassium: 4.4 mmol/L (ref 3.5–5.3)
Sodium: 142 mmol/L (ref 135–146)
Total Bilirubin: 0.4 mg/dL (ref 0.2–1.2)
Total Protein: 6.4 g/dL (ref 6.1–8.1)

## 2020-10-21 LAB — MAGNESIUM: Magnesium: 2.2 mg/dL (ref 1.5–2.5)

## 2020-10-21 LAB — NO CULTURE INDICATED

## 2020-10-25 ENCOUNTER — Emergency Department (HOSPITAL_BASED_OUTPATIENT_CLINIC_OR_DEPARTMENT_OTHER): Payer: Medicare PPO | Admitting: Radiology

## 2020-10-25 ENCOUNTER — Emergency Department (HOSPITAL_BASED_OUTPATIENT_CLINIC_OR_DEPARTMENT_OTHER)
Admission: EM | Admit: 2020-10-25 | Discharge: 2020-10-25 | Disposition: A | Discharge status: Home / Self Care | Payer: Medicare PPO | Attending: Emergency Medicine | Admitting: Emergency Medicine

## 2020-10-25 ENCOUNTER — Other Ambulatory Visit (HOSPITAL_BASED_OUTPATIENT_CLINIC_OR_DEPARTMENT_OTHER): Payer: Self-pay

## 2020-10-25 ENCOUNTER — Other Ambulatory Visit: Payer: Self-pay

## 2020-10-25 ENCOUNTER — Encounter: Payer: Self-pay | Admitting: Hematology and Oncology

## 2020-10-25 ENCOUNTER — Encounter (HOSPITAL_BASED_OUTPATIENT_CLINIC_OR_DEPARTMENT_OTHER): Payer: Self-pay | Admitting: Obstetrics and Gynecology

## 2020-10-25 DIAGNOSIS — I129 Hypertensive chronic kidney disease with stage 1 through stage 4 chronic kidney disease, or unspecified chronic kidney disease: Secondary | ICD-10-CM | POA: Diagnosis not present

## 2020-10-25 DIAGNOSIS — E039 Hypothyroidism, unspecified: Secondary | ICD-10-CM | POA: Insufficient documentation

## 2020-10-25 DIAGNOSIS — I480 Paroxysmal atrial fibrillation: Secondary | ICD-10-CM | POA: Insufficient documentation

## 2020-10-25 DIAGNOSIS — N183 Chronic kidney disease, stage 3 unspecified: Secondary | ICD-10-CM | POA: Diagnosis not present

## 2020-10-25 DIAGNOSIS — Z79899 Other long term (current) drug therapy: Secondary | ICD-10-CM | POA: Insufficient documentation

## 2020-10-25 DIAGNOSIS — R0789 Other chest pain: Secondary | ICD-10-CM | POA: Diagnosis present

## 2020-10-25 DIAGNOSIS — Z7901 Long term (current) use of anticoagulants: Secondary | ICD-10-CM | POA: Diagnosis not present

## 2020-10-25 DIAGNOSIS — R079 Chest pain, unspecified: Secondary | ICD-10-CM | POA: Diagnosis not present

## 2020-10-25 DIAGNOSIS — K449 Diaphragmatic hernia without obstruction or gangrene: Secondary | ICD-10-CM | POA: Diagnosis not present

## 2020-10-25 LAB — BASIC METABOLIC PANEL
Anion gap: 11 (ref 5–15)
BUN: 21 mg/dL (ref 8–23)
CO2: 23 mmol/L (ref 22–32)
Calcium: 10.2 mg/dL (ref 8.9–10.3)
Chloride: 105 mmol/L (ref 98–111)
Creatinine, Ser: 1.06 mg/dL — ABNORMAL HIGH (ref 0.44–1.00)
GFR, Estimated: 54 mL/min — ABNORMAL LOW (ref >60)
Glucose, Bld: 113 mg/dL — ABNORMAL HIGH (ref 70–99)
Potassium: 3.9 mmol/L (ref 3.5–5.1)
Sodium: 139 mmol/L (ref 135–145)

## 2020-10-25 LAB — TROPONIN I (HIGH SENSITIVITY)
Troponin I (High Sensitivity): 11 ng/L (ref <18)
Troponin I (High Sensitivity): 15 ng/L (ref <18)

## 2020-10-25 LAB — CBC
HCT: 42.9 % (ref 36.0–46.0)
Hemoglobin: 14.3 g/dL (ref 12.0–15.0)
MCH: 30.9 pg (ref 26.0–34.0)
MCHC: 33.3 g/dL (ref 30.0–36.0)
MCV: 92.7 fL (ref 80.0–100.0)
Platelets: 63 10*3/uL — ABNORMAL LOW (ref 150–400)
RBC: 4.63 MIL/uL (ref 3.87–5.11)
RDW: 11.9 % (ref 11.5–15.5)
WBC: 5 10*3/uL (ref 4.0–10.5)
nRBC: 0 % (ref 0.0–0.2)

## 2020-10-25 MED ORDER — PROPOFOL 10 MG/ML IV BOLUS
INTRAVENOUS | Status: AC | PRN
  Administered 2020-10-25: 35 mg via INTRAVENOUS

## 2020-10-25 MED ORDER — PROPOFOL 10 MG/ML IV BOLUS
0.5000 mg/kg | Freq: Once | INTRAVENOUS | Status: DC
  Filled 2020-10-25: qty 20

## 2020-10-25 MED ORDER — SODIUM CHLORIDE 0.9 % IV BOLUS
1000.0000 mL | Freq: Once | INTRAVENOUS | Status: AC
Start: 2020-10-25 — End: 2020-10-25
  Administered 2020-10-25: 1000 mL via INTRAVENOUS

## 2020-10-25 MED ORDER — APIXABAN 2.5 MG PO TABS
5.0000 mg | ORAL_TABLET | Freq: Two times a day (BID) | ORAL | Status: DC
  Administered 2020-10-25: 5 mg via ORAL
  Filled 2020-10-25: qty 2

## 2020-10-25 MED ORDER — APIXABAN 5 MG PO TABS
5.0000 mg | ORAL_TABLET | Freq: Two times a day (BID) | ORAL | 0 refills | Status: DC
  Filled 2020-10-25 (×2): qty 60, 30d supply, fill #0

## 2020-10-25 NOTE — Sedation Documentation (Signed)
Patient is going home with her husband, Galileah Piggee.

## 2020-10-25 NOTE — Discharge Instructions (Addendum)

## 2020-10-25 NOTE — ED Triage Notes (Signed)
Patient reports she was awoken this morning with chest pain that made her feel dizzy and nauseated. Patient reports after a near syncopal even she lay down to go back to sleep and woke up feeling better. Patient reports when she awoke again she was feeling better and then was cooking breakfast and had sharp pain in her chest that went to her right shoulder and up the neck. Patient states she felt faint again and called her PCP as she was recently started on Oxybutynin for overactive bladder and was concerned it was a medication reaction.

## 2020-10-25 NOTE — ED Notes (Signed)
Dr. Regenia Skeeter explained about the cardioversion to both the patient and the family and the risk of having an A-fib.  Patient's husband asked their PCP for advised before they will consent to cardioversion.

## 2020-10-25 NOTE — Sedation Documentation (Signed)
Pt denies pain.

## 2020-10-25 NOTE — Sedation Documentation (Signed)
Unable to assess pain due to sedation.  

## 2020-10-25 NOTE — ED Provider Notes (Signed)
[facility] EMERGENCY DEPT Provider Note   CSN: [id number] Arrival date & time: 10/25/20  1213     History Chief Complaint  Patient presents with   Chest Pain    Jillian Rodgers is a 75 y.o. female.  HPI 75 year old female presents with chest tightness and dizziness starting this morning when she woke up around 6 AM.  She just darted oxybutynin last night for an overactive bladder.  Then woke up with the symptoms.  Had some palpitations this morning but seem to have resolved.  Symptoms are worse when she gets up to walk around.  Otherwise she has some mild lightheadedness, chest tightness.  No dyspnea.  Had a slight headache that went away with Tylenol.  Past Medical History:  Diagnosis Date   Anemia with low platelet count (HCC)    Degenerative joint disease    GERD (gastroesophageal reflux disease)    History of kidney stones    Hyperlipidemia    Hypertension    off of b/p meds due to blood pressure fluctuating - discontinued by DR Melford Aase    Hypothyroidism    Inguinal hernia 02/19/2019   Migraines    neurontin helps   Pneumonia    hx of walking pneumonia     Patient Active Problem List   Diagnosis Date Noted   Increased urinary frequency 10/20/2020   Localized osteoporosis without current pathological fracture 10/20/2020   IDA (iron deficiency anemia) 04/07/2020   Thrombocytopenia ([place]) 11/25/2015   Encounter for Medicare annual wellness exam 06/14/2015   Overweight (BMI 25.0-29.9) 01/28/2015   CKD (chronic kidney disease) stage 3, GFR 30-59 ml/min (HCC) 01/19/2014   Upper airway cough syndrome 12/16/2013   Vitamin D deficiency 05/21/2013   Medication management 05/21/2013   Labile hypertension 01/31/2013   Degenerative joint disease    GERD (gastroesophageal reflux disease)    Hyperlipidemia    Hypothyroidism     Past Surgical History:  Procedure Laterality Date   BILATERAL CATARACT SURGERY      BLERPHOROPLASTY \     CHOLECYSTECTOMY   1996   open   COLONOSCOPY  2009   recommended 10 year f/u   HERNIA REPAIR  1985   umb hernia rpr   HERNIA REPAIR  2005 & 2009   ventral hernia rpr   INGUINAL HERNIA REPAIR Left 04/30/2020   Procedure: OPEN LEFT INGUINAL HERNIA REPAIR WITH MESH;  Surgeon: Clovis Riley, MD;  Location: [facility];  Service: General;  Laterality: Left;   PILONIDAL CYST EXCISION  1972   SKIN CANCER REMOVAL      TONSILLECTOMY     UPPER GI ENDOSCOPY     VENTRAL HERNIA REPAIR       OB History     Gravida      Para      Term      Preterm      AB      Living  2      SAB      IAB      Ectopic      Multiple      Live Births              Family History  Problem Relation Age of Onset   Ovarian cancer Mother    Heart disease Mother    Cancer Mother    Uterine cancer Mother    Hypertension Mother    Breast cancer Sister    Stroke Paternal Uncle    Congestive Heart Failure  Father    Gout Maternal Aunt    Colon cancer Neg Hx    Esophageal cancer Neg Hx    Stomach cancer Neg Hx    Rectal cancer Neg Hx     Social History   Tobacco Use   Smoking status: Never   Smokeless tobacco: Never  Vaping Use   Vaping Use: Never used  Substance Use Topics   Alcohol use: No   Drug use: No    Home Medications Prior to Admission medications   Medication Sig Start Date End Date Taking? Authorizing Provider  apixaban (ELIQUIS) 5 MG TABS tablet Take 1 tablet (5 mg total) by mouth 2 (two) times daily. 10/25/20 11/24/20 Yes Sherwood Gambler, MD  Ascorbic Acid (VITAMIN C) 500 MG CAPS Take 500 mg by mouth daily.     [provider]  cetirizine (ZYRTEC) 10 MG tablet Take 10 mg by mouth daily.    [provider]  Cholecalciferol (VITAMIN D PO) Take 5,000 Units by mouth daily.    [provider]  gabapentin (NEURONTIN) 600 MG tablet Take 900-1,200 mg by mouth See admin instructions. Take 900 mg by mouth in morning and 1200 mg at bedtime    [provider]   levothyroxine (SYNTHROID) 50 MCG tablet Take  1 tablet  Daily  on an empty stomach with only water for 30 minutes & no Antacid meds, Calcium or Magnesium for 4 hours & avoid Biotin 08/15/20   Unk Pinto, MD  OnabotulinumtoxinA (BOTOX IJ) Inject 1 vial into the muscle every 8 (eight) weeks.  04/06/15   [provider]  oxybutynin (DITROPAN) 5 MG tablet Take 1 tab at bedtime as needed for over active bladder symptoms. 10/20/20   Liane Comber, NP  pantoprazole (PROTONIX) 40 MG tablet Take  1 tablet  Daily  for Acid Indigestion & Heartburn 06/07/20   Unk Pinto, MD  pravastatin (PRAVACHOL) 40 MG tablet TAKE 1 TABLET BY MOUTH AT BEDTIME FOR CHOLESTEROL 05/24/20   Liane Comber, NP  zinc gluconate 50 MG tablet Take 50 mg by mouth daily.    [provider]    Allergies    Patient has no known allergies.  Review of Systems   Review of Systems  Respiratory:  Negative for shortness of breath.   Cardiovascular:  Positive for chest pain and palpitations.  Neurological:  Positive for light-headedness and headaches. Negative for weakness and numbness.  All other systems reviewed and are negative.  Physical Exam Updated Vital Signs BP (!) 169/100 (BP Location: Left Arm)   Pulse 66   Temp (!) 97.4 F (36.3 C) (Oral)   Resp 15   Ht 5\' 3"  (1.6 m)   Wt 73.5 kg   SpO2 100%   BMI 28.70 kg/m   Physical Exam Vitals and nursing note reviewed.  Constitutional:      General: She is not in acute distress.    Appearance: She is well-developed. She is not ill-appearing or diaphoretic.  HENT:     Head: Normocephalic and atraumatic.     Right Ear: External ear normal.     Left Ear: External ear normal.     Nose: Nose normal.  Eyes:     General:        Right eye: No discharge.        Left eye: No discharge.     Extraocular Movements: Extraocular movements intact.     Pupils: Pupils are equal, round, and reactive to light.  Cardiovascular:  Rate and Rhythm: Tachycardia  present. Rhythm irregular.     Heart sounds: Normal heart sounds.  Pulmonary:     Effort: Pulmonary effort is normal.     Breath sounds: Normal breath sounds.  Abdominal:     Palpations: Abdomen is soft.     Tenderness: There is no abdominal tenderness.  Skin:    General: Skin is warm and dry.  Neurological:     Mental Status: She is alert.     Comments: CN 3-12 grossly intact. 5/5 strength in all 4 extremities. Grossly normal sensation. Normal finger to nose.   Psychiatric:        Mood and Affect: Mood is not anxious.    ED Results / Procedures / Treatments   Labs (all labs ordered are listed, but only abnormal results are displayed) Labs Reviewed  BASIC METABOLIC PANEL - Abnormal; Notable for the following components:      Result Value   Glucose, Bld 113 (*)    Creatinine, Ser 1.06 (*)    GFR, Estimated 54 (*)    All other components within normal limits  CBC - Abnormal; Notable for the following components:   Platelets 63 (*)    All other components within normal limits  TROPONIN I (HIGH SENSITIVITY)  TROPONIN I (HIGH SENSITIVITY)    EKG EKG Interpretation  Date/Time:  Monday October 25 2020 12:24:33 EDT Ventricular Rate:  135 PR Interval:    QRS Duration: 66 QT Interval:  302 QTC Calculation: 453 R Axis:   18 Text Interpretation: Atrial fibrillation with rapid ventricular response Nonspecific ST abnormality Abnormal ECG duplicate ECG Confirmed by Sherwood Gambler [phone]) on 10/25/2020 12:27:31 PM   EKG Interpretation  Date/Time:  Monday October 25 2020 14:45:32 EDT Ventricular Rate:  71 PR Interval:  157 QRS Duration: 81 QT Interval:  386 QTC Calculation: 420 R Axis:   7 Text Interpretation: S inus rhythm no acute ST/T changes afib resolved Confirmed by Sherwood Gambler [phone]) on 10/25/2020 3:37:08 PM         Radiology DG Chest 2 View  Result Date: 10/25/2020 CLINICAL DATA:  Chest pain EXAM: CHEST - 2 VIEW COMPARISON:  07/22/2015 chest radiograph. FINDINGS:  Normal heart size. Moderate to large hiatal hernia, increased. Otherwise normal mediastinal contour. No pneumothorax. No pleural effusion. No pulmonary edema. Mild bibasilar curvilinear scarring versus atelectasis. No acute consolidative airspace disease. Hernia repair mesh in the ventral upper abdomen. Cholecystectomy clips are seen in the right upper quadrant of the abdomen. IMPRESSION: 1. Moderate to large hiatal hernia, increased. 2. Mild bibasilar curvilinear scarring versus atelectasis. Electronically Signed   By: Ilona Sorrel M.D.   On: 10/25/2020 12:50    Procedures .Sedation  Date/Time: 10/25/2020 2:47 PM Performed by: Sherwood Gambler, MD Authorized by: Sherwood Gambler, MD   Consent:    Consent obtained:  Verbal and written   Consent given by:  Patient   Risks discussed:  Allergic reaction, dysrhythmia, inadequate sedation, nausea, vomiting, respiratory compromise necessitating ventilatory assistance and intubation, prolonged sedation necessitating reversal and prolonged hypoxia resulting in organ damage Universal protocol:    Immediately prior to procedure, a time out was called: yes     Patient identity confirmed:  Verbally with patient Indications:    Procedure performed:  Cardioversion   Procedure necessitating sedation performed by:  Physician performing sedation Pre-sedation assessment:    Time since last food or drink:  4 hours   ASA classification: class 2 - patient with mild systemic disease  Mallampati score:  II - soft palate, uvula, fauces visible   Pre-sedation assessments completed and reviewed: airway patency, cardiovascular function, hydration status, mental status, nausea/vomiting, pain level, respiratory function and temperature   Immediate pre-procedure details:    Reviewed: vital signs, relevant labs/tests and NPO status     Verified: bag valve mask available, emergency equipment available, intubation equipment available, IV patency confirmed, oxygen available  and suction available   Procedure details (see MAR for exact dosages):    Preoxygenation:  Nasal cannula   Sedation:  Propofol   Intended level of sedation: deep   Intra-procedure monitoring:  Blood pressure monitoring, cardiac monitor, continuous pulse oximetry and continuous capnometry   Intra-procedure events: none     Total Provider sedation time (minutes):  10 Post-procedure details:    Attendance: Constant attendance by certified staff until patient recovered     Recovery: Patient returned to pre-procedure baseline     Post-sedation assessments completed and reviewed: airway patency, cardiovascular function, hydration status, mental status, nausea/vomiting, pain level, respiratory function and temperature     Patient is stable for discharge or admission: yes     Procedure completion:  Tolerated well, no immediate complications .Cardioversion  Date/Time: 10/25/2020 2:49 PM Performed by: Sherwood Gambler, MD Authorized by: Sherwood Gambler, MD   Consent:    Consent obtained:  Verbal and written   Consent given by:  Patient   Alternatives discussed:  Rate-control medication, anti-coagulation medication and delayed treatment Pre-procedure details:    Cardioversion basis:  Emergent   Rhythm:  Atrial fibrillation   Electrode placement:  Anterior-posterior Patient sedated: Yes. Refer to sedation procedure documentation for details of sedation.  Attempt one:    Cardioversion mode:  Synchronous   Waveform:  Biphasic   Shock (Joules):  150   Shock outcome:  Conversion to normal sinus rhythm Post-procedure details:    Patient status:  Awake   Patient tolerance of procedure:  Tolerated well, no immediate complications   Medications Ordered in ED Medications  propofol (DIPRIVAN) 10 mg/mL bolus/IV push 36.8 mg (36.8 mg Intravenous Not Given 10/25/20 1447)  apixaban (ELIQUIS) tablet 5 mg (5 mg Oral Given 10/25/20 1459)  sodium chloride 0.9 % bolus 1,000 mL (0 mLs Intravenous Stopped  10/25/20 1459)  propofol (DIPRIVAN) 10 mg/mL bolus/IV push (35 mg Intravenous Given 10/25/20 1441)    ED Course  I have reviewed the triage vital signs and the nursing notes.  Pertinent labs & imaging results that were available during my care of the patient were reviewed by me and considered in my medical decision making (see chart for details).    MDM Rules/Calculators/A&P                          Patient presents with new onset symptomatic A. fib.  After long discussion with patient and husband, we decided to cardiovert as her symptoms started this morning.  This was successful and all of her symptoms have resolved after she has woken up from sedation.  Thus I think ACS, PE, dissection, other cause of chest pain is pretty unlikely.  She will be started on Eliquis and follow-up with the A. fib clinic.  This patients CHA2DS2-VASc Score and unadjusted Ischemic Stroke Rate (% per year) is equal to 3.2 % stroke rate/year from a score of 3  Above score calculated as 1 point each if present [CHF, HTN, DM, Vascular=MI/PAD/Aortic Plaque, Age if 65-74, or Female] Above score calculated as 2 points  each if present [Age > 75, or Stroke/TIA/TE]  Final Clinical Impression(s) / ED Diagnoses Final diagnoses:  Paroxysmal atrial fibrillation [facility])    Rx / DC Orders ED Discharge Orders          Ordered    Amb Referral to AFIB Clinic        10/25/20 1450    apixaban (ELIQUIS) 5 MG TABS tablet  2 times daily        10/25/20 1528             Sherwood Gambler, MD 10/25/20 1539

## 2020-11-01 ENCOUNTER — Other Ambulatory Visit: Payer: Self-pay

## 2020-11-01 ENCOUNTER — Ambulatory Visit (HOSPITAL_COMMUNITY)
Admission: RE | Admit: 2020-11-01 | Discharge: 2020-11-01 | Disposition: A | Discharge status: Home / Self Care | Payer: Medicare PPO | Source: Ambulatory Visit | Attending: Physician Assistant | Admitting: Physician Assistant

## 2020-11-01 VITALS — BP 130/86 | HR 70 | Ht 63.0 in | Wt 159.2 lb

## 2020-11-01 DIAGNOSIS — I129 Hypertensive chronic kidney disease with stage 1 through stage 4 chronic kidney disease, or unspecified chronic kidney disease: Secondary | ICD-10-CM | POA: Insufficient documentation

## 2020-11-01 DIAGNOSIS — Z7901 Long term (current) use of anticoagulants: Secondary | ICD-10-CM | POA: Insufficient documentation

## 2020-11-01 DIAGNOSIS — N189 Chronic kidney disease, unspecified: Secondary | ICD-10-CM | POA: Diagnosis not present

## 2020-11-01 DIAGNOSIS — I480 Paroxysmal atrial fibrillation: Secondary | ICD-10-CM | POA: Diagnosis not present

## 2020-11-01 DIAGNOSIS — E039 Hypothyroidism, unspecified: Secondary | ICD-10-CM | POA: Diagnosis not present

## 2020-11-01 DIAGNOSIS — Z8249 Family history of ischemic heart disease and other diseases of the circulatory system: Secondary | ICD-10-CM | POA: Diagnosis not present

## 2020-11-01 DIAGNOSIS — Z79899 Other long term (current) drug therapy: Secondary | ICD-10-CM | POA: Diagnosis not present

## 2020-11-01 DIAGNOSIS — D6869 Other thrombophilia: Secondary | ICD-10-CM | POA: Insufficient documentation

## 2020-11-01 DIAGNOSIS — E785 Hyperlipidemia, unspecified: Secondary | ICD-10-CM | POA: Diagnosis not present

## 2020-11-01 MED ORDER — APIXABAN 5 MG PO TABS: 5.0000 mg | ORAL_TABLET | Freq: Two times a day (BID) | ORAL | 6 refills | Status: DC

## 2020-11-01 NOTE — Progress Notes (Signed)
Primary Care Physician: Unk Pinto, MD Primary Cardiologist: none Primary Electrophysiologist: none Referring Physician: [facility]   Jillian Rodgers is a 75 y.o. female with a history of anemia, HLD, HTN, hypothyroidism, CKD, atrial fibrillation who presents for consultation in the [facility]. The patient was initially diagnosed with atrial fibrillation 10/25/20 after presenting to ED with chest tightness and dizziness. She underwent successful DCCV and was started on Eliquis for a CHADS2VASC score of 4. There were no specific triggers that she could identify. She denies significant snoring or alcohol use. In hindsight, she has had palpitations before.   Today, she denies symptoms of palpitations, chest pain, shortness of breath, orthopnea, PND, lower extremity edema, dizziness, presyncope, syncope, snoring, daytime somnolence, bleeding, or neurologic sequela. The patient is tolerating medications without difficulties and is otherwise without complaint today.    Atrial Fibrillation Risk Factors:  she does not have symptoms or diagnosis of sleep apnea. she does not have a history of rheumatic fever. she does not have a history of alcohol use. The patient does not have a history of early familial atrial fibrillation or other arrhythmias.  she has a BMI of Body mass index is 28.2 kg/m.[name] Kitchen Filed Weights   11/01/20 0927  Weight: 72.2 kg    Family History  Problem Relation Age of Onset   Ovarian cancer Mother    Heart disease Mother    Cancer Mother    Uterine cancer Mother    Hypertension Mother    Breast cancer Sister    Stroke Paternal Uncle    Congestive Heart Failure Father    Gout Maternal Aunt    Colon cancer Neg Hx    Esophageal cancer Neg Hx    Stomach cancer Neg Hx    Rectal cancer Neg Hx      Atrial Fibrillation Management history:  Previous antiarrhythmic drugs: none Previous cardioversions: 10/25/20 Previous  ablations: none CHADS2VASC score: 4 Anticoagulation history: Eliquis   Past Medical History:  Diagnosis Date   Anemia with low platelet count (HCC)    Degenerative joint disease    GERD (gastroesophageal reflux disease)    History of kidney stones    Hyperlipidemia    Hypertension    off of b/p meds due to blood pressure fluctuating - discontinued by DR Melford Aase    Hypothyroidism    Inguinal hernia 02/19/2019   Migraines    neurontin helps   Pneumonia    hx of walking pneumonia    Past Surgical History:  Procedure Laterality Date   BILATERAL CATARACT SURGERY      BLERPHOROPLASTY \     CHOLECYSTECTOMY  1996   open   COLONOSCOPY  2009   recommended 10 year f/u   HERNIA REPAIR  1985   umb hernia rpr   HERNIA REPAIR  2005 & 2009   ventral hernia rpr   INGUINAL HERNIA REPAIR Left 04/30/2020   Procedure: OPEN LEFT INGUINAL HERNIA REPAIR WITH MESH;  Surgeon: Clovis Riley, MD;  Location: [facility];  Service: General;  Laterality: Left;   PILONIDAL CYST EXCISION  1972   SKIN CANCER REMOVAL      TONSILLECTOMY     UPPER GI ENDOSCOPY     VENTRAL HERNIA REPAIR      Current Outpatient Medications  Medication Sig Dispense Refill   apixaban (ELIQUIS) 5 MG TABS tablet Take 1 tablet (5 mg total) by mouth 2 (two) times daily. 60 tablet 0   Ascorbic Acid (  VITAMIN C) 500 MG CAPS Take 500 mg by mouth daily.      BOTOX 100 units SOLR injection      cetirizine (ZYRTEC) 10 MG tablet Take 10 mg by mouth daily.     Cholecalciferol (VITAMIN D PO) Take 5,000 Units by mouth daily.     gabapentin (NEURONTIN) 600 MG tablet Take 900-1,200 mg by mouth See admin instructions. Take 900 mg by mouth in morning and 1200 mg at bedtime     levothyroxine (SYNTHROID) 50 MCG tablet Take  1 tablet  Daily  on an empty stomach with only water for 30 minutes & no Antacid meds, Calcium or Magnesium for 4 hours & avoid Biotin 90 tablet 3   pantoprazole (PROTONIX) 40 MG tablet Take  1 tablet  Daily  for Acid  Indigestion & Heartburn 90 tablet 0   pravastatin (PRAVACHOL) 40 MG tablet TAKE 1 TABLET BY MOUTH AT BEDTIME FOR CHOLESTEROL 90 tablet 3   zinc gluconate 50 MG tablet Take 50 mg by mouth daily.     No current facility-administered medications for this encounter.    No Known Allergies  Social History   Socioeconomic History   Marital status: Married    Spouse name: Not on file   Number of children: 2   Years of education: 18   Highest education level: Not on file  Occupational History   Not on file  Tobacco Use   Smoking status: Never   Smokeless tobacco: Never  Vaping Use   Vaping Use: Never used  Substance and Sexual Activity   Alcohol use: No   Drug use: No   Sexual activity: Yes  Other Topics Concern   Not on file  Social History Narrative   Not on file   Social Determinants of Health   Financial Resource Strain: Not on file  Food Insecurity: Not on file  Transportation Needs: Not on file  Physical Activity: Not on file  Stress: Not on file  Social Connections: Not on file  Intimate Partner Violence: Not on file     ROS- All systems are reviewed and negative except as per the HPI above.  Physical Exam: Vitals:   11/01/20 0927  BP: 130/86  Pulse: 70  Weight: 72.2 kg  Height: 5\' 3"  (1.6 m)    GEN- The patient is a well appearing elderly female, alert and oriented x 3 today.   Head- normocephalic, atraumatic Eyes-  Sclera clear, conjunctiva pink Ears- hearing intact Oropharynx- clear Neck- supple  Lungs- Clear to ausculation bilaterally, normal work of breathing Heart- Regular rate and rhythm, no murmurs, rubs or gallops  GI- soft, NT, ND, + BS Extremities- no clubbing, cyanosis, or edema MS- no significant deformity or atrophy Skin- no rash or lesion Psych- euthymic mood, full affect Neuro- strength and sensation are intact  Wt Readings from Last 3 Encounters:  11/01/20 72.2 kg  10/25/20 73.5 kg  10/20/20 73.9 kg    EKG today demonstrates   SR Vent. rate 70 BPM PR interval 156 ms QRS duration 76 ms QT/QTcB 380/410 ms  Epic records are reviewed at length today  CHA2DS2-VASc Score = 4  The patient's score is based upon: CHF History: No HTN History: Yes Diabetes History: No Stroke History: No Vascular Disease History: No Age Score: 2 Gender Score: 1     ASSESSMENT AND PLAN: 1. Paroxysmal Atrial Fibrillation (ICD10:  I48.0) The patient's CHA2DS2-VASc score is 4, indicating a 4.8% annual risk of stroke.   S/p DCCV  10/25/20 General education about afib provided and questions answered. We also discussed her stroke risk and the risks and benefits of anticoagulation. Check echocardiogram Continue Eliquis 5 mg BID  2. Secondary Hypercoagulable State (ICD10:  D68.69) The patient is at significant risk for stroke/thromboembolism based upon her CHA2DS2-VASc Score of 4.  Continue Apixaban (Eliquis).   3. HTN By history but appears to be well controlled of meds. No changes today.   Patient requesting referral to establish with cardiologist. Will make referral. Follow up in the AF clinic in 6 months.    [facility] [address] [place], [place] [address] [phone] 11/01/2020 9:55 AM

## 2020-12-01 ENCOUNTER — Other Ambulatory Visit: Payer: Medicare PPO

## 2020-12-09 ENCOUNTER — Other Ambulatory Visit: Payer: Self-pay

## 2020-12-09 ENCOUNTER — Ambulatory Visit: Payer: Medicare PPO | Admitting: Adult Health

## 2020-12-09 ENCOUNTER — Encounter: Payer: Self-pay | Admitting: Adult Health

## 2020-12-09 VITALS — BP 122/74 | HR 56 | Temp 97.3°F | Ht 63.0 in | Wt 162.0 lb

## 2020-12-09 DIAGNOSIS — G43719 Chronic migraine without aura, intractable, without status migrainosus: Secondary | ICD-10-CM | POA: Diagnosis not present

## 2020-12-09 DIAGNOSIS — D6869 Other thrombophilia: Secondary | ICD-10-CM

## 2020-12-09 DIAGNOSIS — M791 Myalgia, unspecified site: Secondary | ICD-10-CM | POA: Diagnosis not present

## 2020-12-09 DIAGNOSIS — I480 Paroxysmal atrial fibrillation: Secondary | ICD-10-CM

## 2020-12-09 DIAGNOSIS — R350 Frequency of micturition: Secondary | ICD-10-CM

## 2020-12-09 DIAGNOSIS — G518 Other disorders of facial nerve: Secondary | ICD-10-CM | POA: Diagnosis not present

## 2020-12-09 DIAGNOSIS — G43019 Migraine without aura, intractable, without status migrainosus: Secondary | ICD-10-CM | POA: Diagnosis not present

## 2020-12-09 DIAGNOSIS — M542 Cervicalgia: Secondary | ICD-10-CM | POA: Diagnosis not present

## 2020-12-09 DIAGNOSIS — G43111 Migraine with aura, intractable, with status migrainosus: Secondary | ICD-10-CM | POA: Diagnosis not present

## 2020-12-09 NOTE — Progress Notes (Signed)
Assessment and Plan:  Jillian Rodgers was seen today for medication management.  Diagnoses and all orders for this visit:  Increased urinary frequency Notably nocturnal, has been pushing fluids intake late into night Advise to consume early in day, stop 2 hours prior, limit to sips after and overnight; defer other meds at this time in light of lifestyle change opportunities; she is in agreement.   Paroxysmal atrial fibrillation (HCC) Secondary hypercoagulable state ([place]) Continue xarelto; a. Fib clinic is monitoring Reviewed to avoid stimulants, discussed reduction of caffeine Monitor for recurrent sx; present to ED for any CP, dyspnea, dizziness. Contact office if recurrent brief episodes and will coordinate for holter  Further disposition pending results of labs. Discussed med's effects and SE's.   Over 15 minutes of exam, counseling, chart review, and critical decision making was performed.   Future Appointments  Date Time Provider [place]  12/14/2020 10:00 AM [facility] ECHO OP 1 [facility]  12/30/2020 12:00 PM [facility] LAB [facility] None  12/30/2020 12:40 PM Benay Pike, MD [facility] None  01/19/2021 10:00 AM Belva Crome, MD [facility]  01/25/2021  2:30 PM Magda Bernheim, NP [facility] None  07/18/2021  2:00 PM Unk Pinto, MD [facility] None  10/20/2021  2:30 PM Liane Comber, NP [facility] None    ------------------------------------------------------------------------------------------------------------------  HPI BP 122/74   Pulse (!) 56   Temp (!) 97.3 F (36.3 C)   Ht '5\' 3"'$  (1.6 m)   Wt 162 lb (73.5 kg)   SpO2 97%   BMI 28.70 kg/m  75 y.o.female presents for 1 month follow up for med management after starting oxybutynin for persistent urinary frequency at night.   At last visit she reported urinary frequency, ongoing intermittently for several months, just in the evening but waking her up more and not making it to the bathroom on  occasion. Has been wearing panty liner. Denies burning/urine character change. Denies pelvic pressure or hx of prolapse or cystocele. She follows annually with GYN. She had negative UA. We tried low dose oxybutynin 5 mg at night.   The morning after trying first dose 10/25/2020, she developed palpitations, chest tightness and dizziness, was referred to ED where she was diagnosed with atrial fibrillation. She underwent successful DCCV and was started on Eliquis for a CHADS2VASC score of 4. She is now following with a. Fib. She reports did have 1 more episode of a few min of fluttering in chest, resolved quickly within a few min. She does drink several pepsi daily.    Past Medical History:  Diagnosis Date   Anemia with low platelet count (HCC)    Degenerative joint disease    GERD (gastroesophageal reflux disease)    History of kidney stones    Hyperlipidemia    Hypertension    off of b/p meds due to blood pressure fluctuating - discontinued by DR Melford Aase    Hypothyroidism    Inguinal hernia 02/19/2019   Migraines    neurontin helps   Pneumonia    hx of walking pneumonia      No Known Allergies  Current Outpatient Medications on File Prior to Visit  Medication Sig   apixaban (ELIQUIS) 5 MG TABS tablet Take 1 tablet (5 mg total) by mouth 2 (two) times daily.   Ascorbic Acid (VITAMIN C) 500 MG CAPS Take 500 mg by mouth daily.    BOTOX 100 units SOLR injection    cetirizine (ZYRTEC) 10 MG tablet Take 10 mg by mouth daily.   Cholecalciferol (VITAMIN  D PO) Take 5,000 Units by mouth daily.   gabapentin (NEURONTIN) 600 MG tablet Take 900-1,200 mg by mouth See admin instructions. Take 900 mg by mouth in morning and 1200 mg at bedtime   levothyroxine (SYNTHROID) 50 MCG tablet Take  1 tablet  Daily  on an empty stomach with only water for 30 minutes & no Antacid meds, Calcium or Magnesium for 4 hours & avoid Biotin   pantoprazole (PROTONIX) 40 MG tablet Take  1 tablet  Daily  for Acid Indigestion  & Heartburn   pravastatin (PRAVACHOL) 40 MG tablet TAKE 1 TABLET BY MOUTH AT BEDTIME FOR CHOLESTEROL   zinc gluconate 50 MG tablet Take 50 mg by mouth daily.   No current facility-administered medications on file prior to visit.    ROS: all negative except above.   Physical Exam:  BP 122/74   Pulse (!) 56   Temp (!) 97.3 F (36.3 C)   Ht '5\' 3"'$  (1.6 m)   Wt 162 lb (73.5 kg)   SpO2 97%   BMI 28.70 kg/m   General Appearance: Well nourished, in no apparent distress. Eyes: PERRLA, conjunctiva no swelling or erythema ENT/Mouth: Mask in place; Hearing normal.  Neck: Supple, thyroid normal.  Respiratory: Respiratory effort normal, BS equal bilaterally without rales, rhonchi, wheezing or stridor.  Cardio: RRR with no MRGs. Brisk peripheral pulses without edema.  Abdomen: Soft, + BS.  Non tender. Lymphatics: Non tender without lymphadenopathy.  Musculoskeletal: normal gait.  Skin: Warm, dry without rashes, lesions, ecchymosis.  Neuro: Normal muscle tone Psych: Awake and oriented X 3, normal affect, Insight and Judgment appropriate.     Izora Ribas, NP 4:17 PM [facility]

## 2020-12-10 ENCOUNTER — Telehealth: Payer: Self-pay | Admitting: Hematology and Oncology

## 2020-12-10 NOTE — Telephone Encounter (Signed)
Called patient regarding upcoming appointments, patient is notified. 

## 2020-12-13 DIAGNOSIS — N183 Chronic kidney disease, stage 3 unspecified: Secondary | ICD-10-CM | POA: Diagnosis not present

## 2020-12-13 DIAGNOSIS — I129 Hypertensive chronic kidney disease with stage 1 through stage 4 chronic kidney disease, or unspecified chronic kidney disease: Secondary | ICD-10-CM | POA: Diagnosis not present

## 2020-12-13 DIAGNOSIS — Z6827 Body mass index (BMI) 27.0-27.9, adult: Secondary | ICD-10-CM | POA: Diagnosis not present

## 2020-12-13 DIAGNOSIS — N281 Cyst of kidney, acquired: Secondary | ICD-10-CM | POA: Diagnosis not present

## 2020-12-14 ENCOUNTER — Ambulatory Visit (HOSPITAL_COMMUNITY)
Admission: RE | Admit: 2020-12-14 | Discharge: 2020-12-14 | Disposition: A | Discharge status: Home / Self Care | Payer: Medicare PPO | Source: Ambulatory Visit | Attending: Physician Assistant | Admitting: Physician Assistant

## 2020-12-14 ENCOUNTER — Other Ambulatory Visit: Payer: Self-pay

## 2020-12-14 DIAGNOSIS — I480 Paroxysmal atrial fibrillation: Secondary | ICD-10-CM | POA: Diagnosis not present

## 2020-12-14 LAB — ECHOCARDIOGRAM COMPLETE
AR max vel: 2.07 cm2
AV Area VTI: 1.75 cm2
AV Area mean vel: 1.9 cm2
AV Mean grad: 4 mmHg
AV Peak grad: 7.3 mmHg
Ao pk vel: 1.35 m/s
Area-P 1/2: 3.53 cm2
S' Lateral: 3.1 cm

## 2020-12-14 NOTE — Progress Notes (Signed)
  Echocardiogram 2D Echocardiogram has been performed.  Merrie Roof F 12/14/2020, 11:01 AM

## 2020-12-16 ENCOUNTER — Encounter (HOSPITAL_COMMUNITY): Payer: Self-pay | Admitting: *Deleted

## 2020-12-23 ENCOUNTER — Other Ambulatory Visit: Payer: Self-pay

## 2020-12-23 DIAGNOSIS — K219 Gastro-esophageal reflux disease without esophagitis: Secondary | ICD-10-CM

## 2020-12-23 MED ORDER — PANTOPRAZOLE SODIUM 40 MG PO TBEC: DELAYED_RELEASE_TABLET | ORAL | 0 refills | Status: DC

## 2020-12-30 ENCOUNTER — Other Ambulatory Visit: Payer: Self-pay

## 2020-12-30 ENCOUNTER — Inpatient Hospital Stay: Payer: Medicare PPO | Admitting: Hematology and Oncology

## 2020-12-30 ENCOUNTER — Inpatient Hospital Stay: Payer: Medicare PPO | Attending: Hematology and Oncology

## 2020-12-30 ENCOUNTER — Encounter: Payer: Self-pay | Admitting: Hematology and Oncology

## 2020-12-30 VITALS — BP 129/90 | HR 66 | Temp 97.4°F | Resp 18 | Wt 162.2 lb

## 2020-12-30 DIAGNOSIS — Z7901 Long term (current) use of anticoagulants: Secondary | ICD-10-CM | POA: Insufficient documentation

## 2020-12-30 DIAGNOSIS — D509 Iron deficiency anemia, unspecified: Secondary | ICD-10-CM | POA: Insufficient documentation

## 2020-12-30 DIAGNOSIS — I4891 Unspecified atrial fibrillation: Secondary | ICD-10-CM | POA: Insufficient documentation

## 2020-12-30 DIAGNOSIS — D696 Thrombocytopenia, unspecified: Secondary | ICD-10-CM

## 2020-12-30 LAB — CBC WITH DIFFERENTIAL/PLATELET
Abs Immature Granulocytes: 0.03 10*3/uL (ref 0.00–0.07)
Basophils Absolute: 0 10*3/uL (ref 0.0–0.1)
Basophils Relative: 0 %
Eosinophils Absolute: 0.1 10*3/uL (ref 0.0–0.5)
Eosinophils Relative: 2 %
HCT: 39 % (ref 36.0–46.0)
Hemoglobin: 13.1 g/dL (ref 12.0–15.0)
Immature Granulocytes: 1 %
Lymphocytes Relative: 29 %
Lymphs Abs: 1.6 10*3/uL (ref 0.7–4.0)
MCH: 31.1 pg (ref 26.0–34.0)
MCHC: 33.6 g/dL (ref 30.0–36.0)
MCV: 92.6 fL (ref 80.0–100.0)
Monocytes Absolute: 0.6 10*3/uL (ref 0.1–1.0)
Monocytes Relative: 10 %
Neutro Abs: 3.2 10*3/uL (ref 1.7–7.7)
Neutrophils Relative %: 58 %
Platelets: 60 10*3/uL — ABNORMAL LOW (ref 150–400)
RBC: 4.21 MIL/uL (ref 3.87–5.11)
RDW: 12.1 % (ref 11.5–15.5)
WBC: 5.5 10*3/uL (ref 4.0–10.5)
nRBC: 0 % (ref 0.0–0.2)

## 2020-12-30 LAB — TSH: TSH: 1.502 u[IU]/mL (ref 0.308–3.960)

## 2020-12-30 NOTE — Progress Notes (Signed)
Jillian Rodgers NOTE  Patient Care Team: Unk Pinto, [name], [name]  CHIEF COMPLAINTS/PURPOSE OF CONSULTATION:  Thrombocytopenia  ASSESSMENT & PLAN:   This is a 75 yr old female patient with PMH significant for HTN, dyslipidemia referred to hematology for evaluation and follow up of thrombocytopenia.  She has undergone evaluation, no clear evidence of nutritional deficiencies, hepatitis or hypothyroidism. No known Liver disease. BMB showed hypercellularity, no evidence of dyspoeisis, megakaryocytic hyperplasia. We discussed that this could be an early bone marrow disorder vs ITP Since her last visit, she appears to have started on Eliquis for atrial fibrillation and CHA2DS2-VASc score of 3.  She is scheduled to see cardiology next month, she had an echo which was read normal. Given her moderate thrombocytopenia and platelet count barely crossing 50-60,000 in the past several months, I am worried about concomitant use of Eliquis and increased risk of bleeding.  I have clearly discussed this with her but she is equally worried about stroke since this runs in the and she is considered as moderate to high risk for stroke given the A. fib.  She currently appears to be in normal sinus rhythm.  I have sent a message to her cardiologist to please clarify the long-term need of anticoagulation for this patient. Have also recommended that she get in touch with [place] when she has formed recommendations from cardiology.  She was instructed to go to the nearest facility with any evidence of bleeding or intractable headache and she expressed understanding. She will return to clinic in 4 weeks to review recommendations from cardiology and to discuss the need for monitoring CBC closely.   HISTORY OF PRESENTING ILLNESS:  Jillian Rodgers 75 y.o. female is here because of thrombocytopenia.  History  from initial visit  75 year old female patient with past medical history significant for hypertension, hypothyroidism, dyslipidemia referred to hematology for evaluation of thrombocytopenia. No nutritional deficiency No thyroid disorder Hepatitis panel non reactive BMB showed hypercellular marrow for age with trilineage hematopoiesis. Flow negative or any monoclonal B cell population Iron def noted. Cytogenetics normal.  Interim History  She is here for follow up on thrombocytopenia and IDA Since last visit, she went to the ED with dizziness and chest tightness. Diagnosed with a fib, started on eliquis, scheduled with cardiology, had an ECHO which was normal. No bleeding complaints so far. No B symptoms. No change in breathing No change in urinary habits.  REVIEW OF SYSTEMS:    Constitutional: Denies fevers, chills or abnormal night sweats Eyes: Denies blurriness of vision, double vision or watery eyes Ears, nose, mouth, throat, and face: Denies mucositis or sore throat Respiratory: Denies cough,  Cardiovascular: Denies palpitation, chest discomfort or lower extremity swelling Gastrointestinal:  Denies nausea, heartburn or change in bowel habits Skin: Denies abnormal skin rashes Lymphatics: Denies new lymphadenopathy or easy bruising Neurological:Denies numbness, tingling or new weaknesses Behavioral/Psych: Mood is stable, no new changes  All other systems were reviewed with the patient and are negative.  MEDICAL HISTORY:  Past Medical History:  Diagnosis Date   Anemia with low platelet count (HCC)    Degenerative joint disease    GERD (gastroesophageal reflux disease)    History of kidney stones    Hyperlipidemia    Hypertension    off of b/p meds due to blood pressure fluctuating - discontinued by DR Jillian Rodgers    Hypothyroidism    Inguinal hernia 02/19/2019  Migraines    neurontin helps   Pneumonia    hx of walking pneumonia     SURGICAL HISTORY: Past Surgical  History:  Procedure Laterality Date   BILATERAL CATARACT SURGERY      BLERPHOROPLASTY \     CHOLECYSTECTOMY  1996   open   COLONOSCOPY  2009   recommended 10 year f/u   HERNIA REPAIR  1985   umb hernia rpr   HERNIA REPAIR  2005 & 2009   ventral hernia rpr   INGUINAL HERNIA REPAIR Left 04/30/2020   Procedure: OPEN LEFT INGUINAL HERNIA REPAIR WITH MESH;  Surgeon: Clovis Riley, MD;  Location: [facility];  Service: General;  Laterality: Left;   PILONIDAL CYST EXCISION  1972   SKIN CANCER REMOVAL      TONSILLECTOMY     UPPER GI ENDOSCOPY     VENTRAL HERNIA REPAIR      SOCIAL HISTORY: Social History   Socioeconomic History   Marital status: Married    Spouse name: Not on file   Number of children: 2   Years of education: 18   Highest education level: Not on file  Occupational History   Not on file  Tobacco Use   Smoking status: Never   Smokeless tobacco: Never  Vaping Use   Vaping Use: Never used  Substance and Sexual Activity   Alcohol use: No   Drug use: No   Sexual activity: Yes  Other Topics Concern   Not on file  Social History Narrative   Not on file   Social Determinants of Health   Financial Resource Strain: Not on file  Food Insecurity: Not on file  Transportation Needs: Not on file  Physical Activity: Not on file  Stress: Not on file  Social Connections: Not on file  Intimate Partner Violence: Not on file    FAMILY HISTORY: Family History  Problem Relation Age of Onset   Ovarian cancer Mother    Heart disease Mother    Cancer Mother    Uterine cancer Mother    Hypertension Mother    Breast cancer Sister    Stroke Paternal Uncle    Congestive Heart Failure Father    Gout Maternal Aunt    Colon cancer Neg Hx    Esophageal cancer Neg Hx    Stomach cancer Neg Hx    Rectal cancer Neg Hx     ALLERGIES:  has No Known Allergies.  MEDICATIONS:  Current Outpatient Medications  Medication Sig Dispense Refill   apixaban (ELIQUIS) 5 MG TABS  tablet Take 1 tablet (5 mg total) by mouth 2 (two) times daily. 60 tablet 6   Ascorbic Acid (VITAMIN C) 500 MG CAPS Take 500 mg by mouth daily.      BOTOX 100 units SOLR injection      cetirizine (ZYRTEC) 10 MG tablet Take 10 mg by mouth daily.     Cholecalciferol (VITAMIN D PO) Take 5,000 Units by mouth daily.     gabapentin (NEURONTIN) 600 MG tablet Take 900-1,200 mg by mouth See admin instructions. Take 900 mg by mouth in morning and 1200 mg at bedtime     levothyroxine (SYNTHROID) 50 MCG tablet Take  1 tablet  Daily  on an empty stomach with only water for 30 minutes & no Antacid meds, Calcium or Magnesium for 4 hours & avoid Biotin 90 tablet 3   pantoprazole (PROTONIX) 40 MG tablet Take 1 tablet Daily for Acid Indigestion & Heartburn 90 tablet 0  pravastatin (PRAVACHOL) 40 MG tablet TAKE 1 TABLET BY MOUTH AT BEDTIME FOR CHOLESTEROL 90 tablet 3   zinc gluconate 50 MG tablet Take 50 mg by mouth daily.     No current facility-administered medications for this visit.     PHYSICAL EXAMINATION: ECOG PERFORMANCE STATUS: 0 - Asymptomatic  Vitals:   12/30/20 1223  BP: 129/90  Pulse: 66  Resp: 18  Temp: (!) 97.4 F (36.3 C)  SpO2: 100%   Filed Weights   12/30/20 1223  Weight: 162 lb 4 oz (73.6 kg)    GENERAL:alert, no distress and comfortable  Physical Exam Constitutional:      Appearance: She is normal weight.  HENT:     Head: Normocephalic and atraumatic.     Mouth/Throat:     Mouth: Mucous membranes are moist.     Pharynx: Oropharynx is clear.  Cardiovascular:     Rate and Rhythm: Normal rate and regular rhythm.     Pulses: Normal pulses.     Heart sounds: Normal heart sounds.  Pulmonary:     Effort: Pulmonary effort is normal.     Breath sounds: Normal breath sounds.  Abdominal:     General: Abdomen is flat.     Palpations: Abdomen is soft.  Musculoskeletal:        General: No swelling or tenderness.     Cervical back: Normal range of motion and neck supple. No  rigidity.  Skin:    General: Skin is warm and dry.  Neurological:     Mental Status: She is alert.  Psychiatric:        Mood and Affect: Mood normal.     LABORATORY DATA:  I have reviewed the data as listed Lab Results  Component Value Date   WBC 5.5 12/30/2020   HGB 13.1 12/30/2020   HCT 39.0 12/30/2020   MCV 92.6 12/30/2020   PLT 60 (L) 12/30/2020     Chemistry      Component Value Date/Time   NA 139 10/25/2020 1229   K 3.9 10/25/2020 1229   CL 105 10/25/2020 1229   CO2 23 10/25/2020 1229   BUN 21 10/25/2020 1229   CREATININE 1.06 (H) 10/25/2020 1229   CREATININE 1.17 (H) 10/20/2020 1542      Component Value Date/Time   CALCIUM 10.2 10/25/2020 1229   ALKPHOS 81 09/30/2020 1455   AST 16 10/20/2020 1542   AST 18 09/30/2020 1455   ALT 15 10/20/2020 1542   ALT 14 09/30/2020 1455   BILITOT 0.4 10/20/2020 1542   BILITOT 0.5 09/30/2020 1455     I have reviewed labs for the past 10 yrs. no thrombocytopenia in 2009.  There were no labs between 2009 and 2014.  Platelet count was 120,000 in 2014 and has progressively declined over the past several years. Slowly progressive thrombocytopenia over the past 5/6 yrs,   RADIOGRAPHIC STUDIES: I have personally reviewed the radiological images as listed and agreed with the findings in the report. ECHOCARDIOGRAM COMPLETE  Result Date: 12/14/2020    ECHOCARDIOGRAM REPORT   Patient Name:   Jillian Rodgers Date of Exam: 12/14/2020 Medical Rec #:  [id number]              Height:       63.0 in Accession #:    [id number]             Weight:       162.0 lb Date of Birth:  Jul 05, 1945  BSA:          1.768 m Patient Age:    42 years               BP:           132/82 mmHg Patient Gender: F                      HR:           59 bpm. Exam Location:  Outpatient Procedure: 2D Echo, Cardiac Doppler and Color Doppler Indications:    Atrial Fibrillation  History:        Patient has no prior history of Echocardiogram examinations.   Sonographer:    Merrie Roof RDCS Referring Phys: [id number] [place]  1. Left ventricular ejection fraction, by estimation, is 60 to 65%. The left ventricle has normal function. The left ventricle has no regional wall motion abnormalities. Left ventricular diastolic parameters are indeterminate.  2. Right ventricular systolic function is normal. The right ventricular size is normal. There is normal pulmonary artery systolic pressure.  3. The mitral valve is normal in structure. Mild mitral valve regurgitation.  4. The aortic valve is tricuspid. Aortic valve regurgitation is not visualized. Mild aortic valve sclerosis is present, with no evidence of aortic valve stenosis. FINDINGS  Left Ventricle: Left ventricular ejection fraction, by estimation, is 60 to 65%. The left ventricle has normal function. The left ventricle has no regional wall motion abnormalities. The left ventricular internal cavity size was normal in size. There is  no left ventricular hypertrophy. Left ventricular diastolic parameters are indeterminate. Right Ventricle: The right ventricular size is normal. Right vetricular wall thickness was not assessed. Right ventricular systolic function is normal. There is normal pulmonary artery systolic pressure. The tricuspid regurgitant velocity is 2.21 m/s, and with an assumed right atrial pressure of 3 mmHg, the estimated right ventricular systolic pressure is 39.0 mmHg. Left Atrium: Left atrial size was normal in size. Right Atrium: Right atrial size was normal in size. Pericardium: There is no evidence of pericardial effusion. Mitral Valve: The mitral valve is normal in structure. Mild mitral valve regurgitation. Tricuspid Valve: The tricuspid valve is normal in structure. Tricuspid valve regurgitation is trivial. Aortic Valve: The aortic valve is tricuspid. Aortic valve regurgitation is not visualized. Mild aortic valve sclerosis is present, with no evidence of aortic valve stenosis.  Aortic valve mean gradient measures 4.0 mmHg. Aortic valve peak gradient measures 7.3 mmHg. Aortic valve area, by VTI measures 1.75 cm. Pulmonic Valve: The pulmonic valve was not well visualized. Pulmonic valve regurgitation is not visualized. No evidence of pulmonic stenosis. Aorta: The aortic root and ascending aorta are structurally normal, with no evidence of dilitation. IAS/Shunts: No atrial level shunt detected by color flow Doppler.  LEFT VENTRICLE PLAX 2D LVIDd:         4.40 cm  Diastology LVIDs:         3.10 cm  LV e' medial:   5.55 cm/s LV PW:         1.00 cm  LV E/e' medial: 12.0 LV IVS:        0.90 cm LVOT diam:     1.80 cm LV SV:         58 LV SV Index:   33 LVOT Area:     2.54 cm  RIGHT VENTRICLE RV Basal diam:  3.30 cm LEFT ATRIUM  Index       RIGHT ATRIUM           Index LA diam:        3.80 cm 2.15 cm/m  RA Area:     17.60 cm LA Vol (A2C):   63.1 ml 35.69 ml/m RA Volume:   44.30 ml  25.06 ml/m LA Vol (A4C):   58.0 ml 32.81 ml/m LA Biplane Vol: 61.6 ml 34.84 ml/m  AORTIC VALVE AV Area (Vmax):    2.07 cm AV Area (Vmean):   1.90 cm AV Area (VTI):     1.75 cm AV Vmax:           135.00 cm/s AV Vmean:          91.700 cm/s AV VTI:            0.328 m AV Peak Grad:      7.3 mmHg AV Mean Grad:      4.0 mmHg LVOT Vmax:         110.00 cm/s LVOT Vmean:        68.500 cm/s LVOT VTI:          0.226 m LVOT/AV VTI ratio: 0.69  AORTA Ao Root diam: 2.60 cm Ao Asc diam:  2.40 cm MITRAL VALVE               TRICUSPID VALVE MV Area (PHT): 3.53 cm    TR Peak grad:   19.5 mmHg MV Decel Time: 215 msec    TR Vmax:        221.00 cm/s MV E velocity: 66.70 cm/s MV A velocity: 85.80 cm/s  SHUNTS MV E/A ratio:  0.78        Systemic VTI:  0.23 m                            Systemic Diam: 1.80 cm Dorris Carnes MD Electronically signed by Dorris Carnes MD Signature Date/Time: 12/14/2020/5:20:02 PM    Final     I have reviewed bone marrow biopsy reports. I spent 30 minutes in the care of this patient, review of  records from the recent hospitalization, calculated CHA2DS2-VASc score, discussed about potential issues with concomitant use of anticoagulation as well as thrombocytopenia, discussed about symptoms and signs to monitor for.   Benay Pike, MD 12/30/2020 1:00 PM

## 2021-01-17 NOTE — Progress Notes (Signed)
Cardiology Office Note:    Date:  01/19/2021   ID:  Jillian Rodgers August 14, 1945, MRN [id number]  PCP:  Unk Pinto, MD  Cardiologist:  None   Referring MD: Oliver Barre, PA   Chief Complaint  Patient presents with   Atrial Fibrillation   Advice Only    Anticoagulation     History of Present Illness:    Jillian Rodgers is a 75 y.o. female with a hx of atrial fibrillation, s/p cardioversion 10/25/2020 in ER [facility]. CHADS VASC 4 and still on Eliquis. Requested to establish with a cardiologist.  Patient is very pleasant.  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  She recently had an episode of atrial fibrillation that required cardioversion electrically in the emergency room on July 11.  Based upon the patient's recollection, she has had at least 1 other episode of atrial fibrillation a month or so prior to the emergency room presentation.  She has also had a subsequent episode since the emergency room that lasted less than 2 to 5 minutes.  Each episode has occurred during sleep, awakening her with the sensation that her heart is racing and with associated dyspnea.  She confesses that her family says she snores.  She does have some degree of excessive daytime sleepiness.  She does not drink alcohol.  She does not overindulge in coffee or other caffeinated beverages.   Past Medical History:  Diagnosis Date   Anemia with low platelet count (HCC)    Degenerative joint disease    GERD (gastroesophageal reflux disease)    History of kidney stones    Hyperlipidemia    Hypertension    off of b/p meds due to blood pressure fluctuating - discontinued by DR Melford Aase    Hypothyroidism    Inguinal hernia 02/19/2019   Migraines    neurontin helps   Pneumonia    hx of walking pneumonia     Past Surgical History:  Procedure Laterality Date   BILATERAL CATARACT SURGERY      BLERPHOROPLASTY \     CHOLECYSTECTOMY  1996   open   COLONOSCOPY  2009    recommended 10 year f/u   HERNIA REPAIR  1985   umb hernia rpr   HERNIA REPAIR  2005 & 2009   ventral hernia rpr   INGUINAL HERNIA REPAIR Left 04/30/2020   Procedure: OPEN LEFT INGUINAL HERNIA REPAIR WITH MESH;  Surgeon: Clovis Riley, MD;  Location: [facility];  Service: General;  Laterality: Left;   PILONIDAL CYST EXCISION  1972   SKIN CANCER REMOVAL      TONSILLECTOMY     UPPER GI ENDOSCOPY     VENTRAL HERNIA REPAIR      Current Medications: Current Meds  Medication Sig   Ascorbic Acid (VITAMIN C) 500 MG CAPS Take 500 mg by mouth daily.    BOTOX 100 units SOLR injection    cetirizine (ZYRTEC) 10 MG tablet Take 10 mg by mouth daily.   Cholecalciferol (VITAMIN D PO) Take 5,000 Units by mouth daily.   gabapentin (NEURONTIN) 600 MG tablet Take 900-1,200 mg by mouth See admin instructions. Take 900 mg by mouth in morning and 1200 mg at bedtime   levothyroxine (SYNTHROID) 50 MCG tablet Take  1 tablet  Daily  on an empty stomach with only water for 30 minutes & no Antacid meds, Calcium or Magnesium for 4 hours & avoid Biotin   pantoprazole (PROTONIX) 40 MG tablet Take 1 tablet Daily  for Acid Indigestion & Heartburn   pravastatin (PRAVACHOL) 40 MG tablet TAKE 1 TABLET BY MOUTH AT BEDTIME FOR CHOLESTEROL   zinc gluconate 50 MG tablet Take 50 mg by mouth daily.     Allergies:   Patient has no known allergies.   Social History   Socioeconomic History   Marital status: Married    Spouse name: Not on file   Number of children: 2   Years of education: 18   Highest education level: Not on file  Occupational History   Not on file  Tobacco Use   Smoking status: Never   Smokeless tobacco: Never  Vaping Use   Vaping Use: Never used  Substance and Sexual Activity   Alcohol use: No   Drug use: No   Sexual activity: Yes  Other Topics Concern   Not on file  Social History Narrative   Not on file   Social Determinants of Health   Financial Resource Strain: Not on file  Food  Insecurity: Not on file  Transportation Needs: Not on file  Physical Activity: Not on file  Stress: Not on file  Social Connections: Not on file     Family History: The patient's family history includes Breast cancer in her sister; Cancer in her mother; Congestive Heart Failure in her father; Gout in her maternal aunt; Heart disease in her mother; Hypertension in her mother; Ovarian cancer in her mother; Stroke in her paternal uncle; Uterine cancer in her mother. There is no history of Colon cancer, Esophageal cancer, Stomach cancer, or Rectal cancer.  ROS:   Please see the history of present illness.    Since apixaban was started in the emergency room and 5 mg twice daily, she is not experience bleeding in her urine or stool.  She understands that she is at increased risk for bleeding due to concomitant low platelet count.  She is even more concerned however that she may have a stroke.  All other systems reviewed and are negative.  EKGs/Labs/Other Studies Reviewed:    The following studies were reviewed today:  ECHOCARDIOGRAM 12/14/2020:  IMPRESSIONS   1. Left ventricular ejection fraction, by estimation, is 60 to 65%. The  left ventricle has normal function. The left ventricle has no regional  wall motion abnormalities. Left ventricular diastolic parameters are  indeterminate.   2. Right ventricular systolic function is normal. The right ventricular  size is normal. There is normal pulmonary artery systolic pressure.   3. The mitral valve is normal in structure. Mild mitral valve  regurgitation.   4. The aortic valve is tricuspid. Aortic valve regurgitation is not  visualized. Mild aortic valve sclerosis is present, with no evidence of  aortic valve stenosis.  5. LA size normal.   EKG:  EKG 10/25/2020 AF with RVR.10/25/2020 at 1445, NSR with normal appearance post conversion from AF. 11/01/2020 is normal and unchanged compared to prior.  Recent Labs: 10/20/2020: ALT 15; Magnesium  2.2 10/25/2020: BUN 21; Creatinine, Ser 1.06; Potassium 3.9; Sodium 139 12/30/2020: Hemoglobin 13.1; Platelets 60; TSH 1.502  Recent Lipid Panel    Component Value Date/Time   CHOL 177 10/20/2020 1542   TRIG 210 (H) 10/20/2020 1542   HDL 63 10/20/2020 1542   CHOLHDL 2.8 10/20/2020 1542   VLDL 34 (H) 09/26/2016 1147   LDLCALC 84 10/20/2020 1542    Physical Exam:    VS:  BP 112/72   Pulse 67   Ht 5\' 3"  (1.6 m)   Wt 161 lb (73  kg)   SpO2 98%   BMI 28.52 kg/m     Wt Readings from Last 3 Encounters:  01/19/21 161 lb (73 kg)  12/30/20 162 lb 4 oz (73.6 kg)  12/09/20 162 lb (73.5 kg)     GEN: Palpable with age. No acute distress HEENT: Normal NECK: No JVD. LYMPHATICS: No lymphadenopathy CARDIAC: No murmur. RRR no gallop, or edema. VASCULAR:  Normal Pulses. No bruits. RESPIRATORY:  Clear to auscultation without rales, wheezing or rhonchi  ABDOMEN: Soft, non-tender, non-distended, No pulsatile mass, MUSCULOSKELETAL: No deformity  SKIN: Warm and dry NEUROLOGIC:  Alert and oriented x 3 PSYCHIATRIC:  Normal affect   ASSESSMENT:    1. Paroxysmal atrial fibrillation (HCC)   2. Secondary hypercoagulable state ([place])   3. Primary hypertension   4. Mixed hyperlipidemia   5. Stage 3b chronic kidney disease ([place])   6. Thrombocytopenia ([place])   7. Snoring    PLAN:    In order of problems listed above:  She has a CHADS VASC score of 3.  She has had at least 3 episodes of atrial fibrillation with one lasting long enough to require cardioversion.  We will plan a 30-day monitor to determine baseline A. fib burden.  She also needs to have a sleep study performed to rule out sleep apnea as a precipitant since all episodes have occurred nocturnally awakening the patient from sleep.  Depending upon burden, we may have an opportunity to make some decision concerning the need for anticoagulation. Continue apixaban 5 mg twice daily. Current blood pressure is normal.  She is on no  therapy. Continue Pravachol. Most recent creatinine is normal. Thrombocytopenia along with apixaban therapy increases downstream risk of significant bleeding.  If she has high burden atrial fibrillation and thrombocytopenia would be a chronic problem, perhaps left atrial appendage occlusion would be a consideration although she would have to have anticoagulation for at least 3 months following the procedure. We need to exclude obstructive sleep apnea given the pattern of her episodes of atrial fibrillation.    Medication Adjustments/Labs and Tests Ordered: Current medicines are reviewed at length with the patient today.  Concerns regarding medicines are outlined above.  Orders Placed This Encounter  Procedures   Cardiac event monitor   Split night study    No orders of the defined types were placed in this encounter.   Patient Instructions  Medication Instructions:  Your physician recommends that you continue on your current medications as directed. Please refer to the Current Medication list given to you today.  *If you need a refill on your cardiac medications before your next appointment, please call your pharmacy*   Lab Work: None If you have labs (blood work) drawn today and your tests are completely normal, you will receive your results only by: [place] (if you have MyChart) OR A paper copy in the mail If you have any lab test that is abnormal or we need to change your treatment, we will call you to review the results.   Testing/Procedures: Your physician has recommended that you wear an event monitor. Event monitors are medical devices that record the heart's electrical activity. Doctors most often [place] these monitors to diagnose arrhythmias. Arrhythmias are problems with the speed or rhythm of the heartbeat. The monitor is a small, portable device. You can wear one while you do your normal daily activities. This is usually used to diagnose what is causing  palpitations/syncope (passing out).  Your physician has recommended that you have a  sleep study. This test records several body functions during sleep, including: brain activity, eye movement, oxygen and carbon dioxide blood levels, heart rate and rhythm, breathing rate and rhythm, the flow of air through your mouth and nose, snoring, body muscle movements, and chest and belly movement.   Follow-Up: At [facility], you and your health needs are our priority.  As part of our continuing mission to provide you with exceptional heart care, we have created designated Provider Care Teams.  These Care Teams include your primary Cardiologist (physician) and Advanced Practice Providers (APPs -  Physician Assistants and Nurse Practitioners) who all work together to provide you with the care you need, when you need it.  We recommend signing up for the patient portal called "MyChart".  Sign up information is provided on this After Visit Summary.  MyChart is used to connect with patients for Virtual Visits (Telemedicine).  Patients are able to view lab/test results, encounter notes, upcoming appointments, etc.  Non-urgent messages can be sent to your provider as well.   To learn more about what you can do with MyChart, go to [email or web address].    Your next appointment:   6-8 week(s)  The format for your next appointment:   In Person  Provider:   You may see Daneen Schick, MD or one of the following Advanced Practice Providers on your designated Care Team:   Cecilie Kicks, NP   Other Instructions  [employer] Cardiac Event Monitor Instructions Your physician has requested you wear your cardiac event monitor for 30 days. [employer] may call or text to confirm a shipping address. The monitor will be sent to a land address via UPS. [employer] will not ship a monitor to a PO BOX. It typically takes 3-5 days to receive your monitor after it has been enrolled. [employer] will assist with USPS tracking if  your package is delayed. The telephone number for [employer] is [phone]. Once you have received your monitor, please review the enclosed instructions. Instruction tutorials can also be viewed under help and settings on the enclosed cell phone. Your monitor has already been registered assigning a specific monitor serial # to you.  Applying the monitor Remove cell phone from case and turn it on. The cell phone works as Dealer and needs to be within [employer] of you at all times. The cell phone will need to be charged on a daily basis. We recommend you plug the cell phone into the enclosed charger at your bedside table every night.  Monitor batteries: You will receive two monitor batteries labelled #1 and #2. These are your recorders. Plug battery #2 onto the second connection on the enclosed charger. Keep one battery on the charger at all times. This will keep the monitor battery deactivated. It will also keep it fully charged for when you need to switch your monitor batteries. A small light will be blinking on the battery emblem when it is charging. The light on the battery emblem will remain on when the battery is fully charged.  Open package of a Monitor strip. Insert battery #1 into black hood on strip and gently squeeze monitor battery onto connection as indicated in instruction booklet. Set aside while preparing skin.  Choose location for your strip, vertical or horizontal, as indicated in the instruction booklet. Shave to remove all hair from location. There cannot be any lotions, oils, powders, or colognes on skin where monitor is to be applied. Wipe skin clean with enclosed Saline wipe. Dry skin completely.  Peel paper labeled #1 off the back of the Monitor strip exposing the adhesive. Place the monitor on the chest in the vertical or horizontal position shown in the instruction booklet. One arrow on the monitor strip must be pointing upward. Carefully remove paper  labeled #2, attaching remainder of strip to your skin. Try not to create any folds or wrinkles in the strip as you apply it.  Firmly press and release the circle in the center of the monitor battery. You will hear a small beep. This is turning the monitor battery on. The heart emblem on the monitor battery will light up every 5 seconds if the monitor battery in turned on and connected to the patient securely. Do not push and hold the circle down as this turns the monitor battery off. The cell phone will locate the monitor battery. A screen will appear on the cell phone checking the connection of your monitor strip. This may read poor connection initially but change to good connection within the next minute. Once your monitor accepts the connection you will hear a series of 3 beeps followed by a climbing crescendo of beeps. A screen will appear on the cell phone showing the two monitor strip placement options. Touch the picture that demonstrates where you applied the monitor strip.  Your monitor strip and battery are waterproof. You are able to shower, bathe, or swim with the monitor on. They just ask you do not submerge deeper than 3 feet underwater. We recommend removing the monitor if you are swimming in a lake, river, or ocean.  Your monitor battery will need to be switched to a fully charged monitor battery approximately once a week. The cell phone will alert you of an action which needs to be made.  On the cell phone, tap for details to reveal connection status, monitor battery status, and cell phone battery status. The green dots indicates your monitor is in good status. A red dot indicates there is something that needs your attention.  To record a symptom, click the circle on the monitor battery. In 30-60 seconds a list of symptoms will appear on the cell phone. Select your symptom and tap save. Your monitor will record a sustained or significant arrhythmia regardless of you clicking  the button. Some patients do not feel the heart rhythm irregularities. [employer] will notify us of any serious or critical events.  Refer to instruction booklet for instructions on switching batteries, changing strips, the Do not disturb or Pause features, or any additional questions.  Call [employer] at [phone], to confirm your monitor is transmitting and record your baseline. They will answer any questions you may have regarding the monitor instructions at that time.  Returning the monitor to [place] all equipment back into blue box. Peel off strip of paper to expose adhesive and close box securely. There is a prepaid UPS shipping label on this box. Drop in a UPS drop box, or at a UPS facility like [employer]. You may also contact [employer] to arrange UPS to pick up monitor package at your home.   Signed, Sinclair Grooms, MD  01/19/2021 11:02 AM    [place]

## 2021-01-18 ENCOUNTER — Encounter: Payer: Medicare PPO | Admitting: Internal Medicine

## 2021-01-19 ENCOUNTER — Other Ambulatory Visit: Payer: Self-pay

## 2021-01-19 ENCOUNTER — Encounter: Payer: Self-pay | Admitting: Interventional Cardiology

## 2021-01-19 ENCOUNTER — Ambulatory Visit: Payer: Medicare PPO | Admitting: Interventional Cardiology

## 2021-01-19 ENCOUNTER — Encounter: Payer: Self-pay | Admitting: Radiology

## 2021-01-19 VITALS — BP 112/72 | HR 67 | Ht 63.0 in | Wt 161.0 lb

## 2021-01-19 DIAGNOSIS — R0683 Snoring: Secondary | ICD-10-CM

## 2021-01-19 DIAGNOSIS — D6869 Other thrombophilia: Secondary | ICD-10-CM | POA: Diagnosis not present

## 2021-01-19 DIAGNOSIS — I480 Paroxysmal atrial fibrillation: Secondary | ICD-10-CM | POA: Diagnosis not present

## 2021-01-19 DIAGNOSIS — I10 Essential (primary) hypertension: Secondary | ICD-10-CM

## 2021-01-19 DIAGNOSIS — E782 Mixed hyperlipidemia: Secondary | ICD-10-CM | POA: Diagnosis not present

## 2021-01-19 DIAGNOSIS — N1832 Chronic kidney disease, stage 3b: Secondary | ICD-10-CM

## 2021-01-19 DIAGNOSIS — D696 Thrombocytopenia, unspecified: Secondary | ICD-10-CM

## 2021-01-19 DIAGNOSIS — D500 Iron deficiency anemia secondary to blood loss (chronic): Secondary | ICD-10-CM

## 2021-01-19 NOTE — Patient Instructions (Signed)
Medication Instructions:  Your physician recommends that you continue on your current medications as directed. Please refer to the Current Medication list given to you today.  *If you need a refill on your cardiac medications before your next appointment, please call your pharmacy*   Lab Work: None If you have labs (blood work) drawn today and your tests are completely normal, you will receive your results only by: [place] (if you have MyChart) OR A paper copy in the mail If you have any lab test that is abnormal or we need to change your treatment, we will call you to review the results.   Testing/Procedures: Your physician has recommended that you wear an event monitor. Event monitors are medical devices that record the heart's electrical activity. Doctors most often [place] these monitors to diagnose arrhythmias. Arrhythmias are problems with the speed or rhythm of the heartbeat. The monitor is a small, portable device. You can wear one while you do your normal daily activities. This is usually used to diagnose what is causing palpitations/syncope (passing out).  Your physician has recommended that you have a sleep study. This test records several body functions during sleep, including: brain activity, eye movement, oxygen and carbon dioxide blood levels, heart rate and rhythm, breathing rate and rhythm, the flow of air through your mouth and nose, snoring, body muscle movements, and chest and belly movement.   Follow-Up: At [facility], you and your health needs are our priority.  As part of our continuing mission to provide you with exceptional heart care, we have created designated Provider Care Teams.  These Care Teams include your primary Cardiologist (physician) and Advanced Practice Providers (APPs -  Physician Assistants and Nurse Practitioners) who all work together to provide you with the care you need, when you need it.  We recommend signing up for the patient portal called  "MyChart".  Sign up information is provided on this After Visit Summary.  MyChart is used to connect with patients for Virtual Visits (Telemedicine).  Patients are able to view lab/test results, encounter notes, upcoming appointments, etc.  Non-urgent messages can be sent to your provider as well.   To learn more about what you can do with MyChart, go to [email or web address].    Your next appointment:   6-8 week(s)  The format for your next appointment:   In Person  Provider:   You may see Daneen Schick, MD or one of the following Advanced Practice Providers on your designated Care Team:   Cecilie Kicks, NP   Other Instructions  [employer] Cardiac Event Monitor Instructions Your physician has requested you wear your cardiac event monitor for 30 days. [employer] may call or text to confirm a shipping address. The monitor will be sent to a land address via UPS. [employer] will not ship a monitor to a PO BOX. It typically takes 3-5 days to receive your monitor after it has been enrolled. [employer] will assist with USPS tracking if your package is delayed. The telephone number for [employer] is [phone]. Once you have received your monitor, please review the enclosed instructions. Instruction tutorials can also be viewed under help and settings on the enclosed cell phone. Your monitor has already been registered assigning a specific monitor serial # to you.  Applying the monitor Remove cell phone from case and turn it on. The cell phone works as Dealer and needs to be within [employer] of you at all times. The cell phone will need to be charged on a  daily basis. We recommend you plug the cell phone into the enclosed charger at your bedside table every night.  Monitor batteries: You will receive two monitor batteries labelled #1 and #2. These are your recorders. Plug battery #2 onto the second connection on the enclosed charger. Keep one battery on the charger at all  times. This will keep the monitor battery deactivated. It will also keep it fully charged for when you need to switch your monitor batteries. A small light will be blinking on the battery emblem when it is charging. The light on the battery emblem will remain on when the battery is fully charged.  Open package of a Monitor strip. Insert battery #1 into black hood on strip and gently squeeze monitor battery onto connection as indicated in instruction booklet. Set aside while preparing skin.  Choose location for your strip, vertical or horizontal, as indicated in the instruction booklet. Shave to remove all hair from location. There cannot be any lotions, oils, powders, or colognes on skin where monitor is to be applied. Wipe skin clean with enclosed Saline wipe. Dry skin completely.  Peel paper labeled #1 off the back of the Monitor strip exposing the adhesive. Place the monitor on the chest in the vertical or horizontal position shown in the instruction booklet. One arrow on the monitor strip must be pointing upward. Carefully remove paper labeled #2, attaching remainder of strip to your skin. Try not to create any folds or wrinkles in the strip as you apply it.  Firmly press and release the circle in the center of the monitor battery. You will hear a small beep. This is turning the monitor battery on. The heart emblem on the monitor battery will light up every 5 seconds if the monitor battery in turned on and connected to the patient securely. Do not push and hold the circle down as this turns the monitor battery off. The cell phone will locate the monitor battery. A screen will appear on the cell phone checking the connection of your monitor strip. This may read poor connection initially but change to good connection within the next minute. Once your monitor accepts the connection you will hear a series of 3 beeps followed by a climbing crescendo of beeps. A screen will appear on the cell  phone showing the two monitor strip placement options. Touch the picture that demonstrates where you applied the monitor strip.  Your monitor strip and battery are waterproof. You are able to shower, bathe, or swim with the monitor on. They just ask you do not submerge deeper than 3 feet underwater. We recommend removing the monitor if you are swimming in a lake, river, or ocean.  Your monitor battery will need to be switched to a fully charged monitor battery approximately once a week. The cell phone will alert you of an action which needs to be made.  On the cell phone, tap for details to reveal connection status, monitor battery status, and cell phone battery status. The green dots indicates your monitor is in good status. A red dot indicates there is something that needs your attention.  To record a symptom, click the circle on the monitor battery. In 30-60 seconds a list of symptoms will appear on the cell phone. Select your symptom and tap save. Your monitor will record a sustained or significant arrhythmia regardless of you clicking the button. Some patients do not feel the heart rhythm irregularities. [employer] will notify us of any serious or critical events.  Refer to instruction booklet for instructions on switching batteries, changing strips, the Do not disturb or Pause features, or any additional questions.  Call [employer] at [phone], to confirm your monitor is transmitting and record your baseline. They will answer any questions you may have regarding the monitor instructions at that time.  Returning the monitor to [place] all equipment back into blue box. Peel off strip of paper to expose adhesive and close box securely. There is a prepaid UPS shipping label on this box. Drop in a UPS drop box, or at a UPS facility like [employer]. You may also contact [employer] to arrange UPS to pick up monitor package at your home.

## 2021-01-19 NOTE — Progress Notes (Signed)
Enrolled patient for a 30 day [employer] Event Monitor to be mailed to patients home  

## 2021-01-20 DIAGNOSIS — G43019 Migraine without aura, intractable, without status migrainosus: Secondary | ICD-10-CM | POA: Diagnosis not present

## 2021-01-20 DIAGNOSIS — G43111 Migraine with aura, intractable, with status migrainosus: Secondary | ICD-10-CM | POA: Diagnosis not present

## 2021-01-20 DIAGNOSIS — G518 Other disorders of facial nerve: Secondary | ICD-10-CM | POA: Diagnosis not present

## 2021-01-20 DIAGNOSIS — G43719 Chronic migraine without aura, intractable, without status migrainosus: Secondary | ICD-10-CM | POA: Diagnosis not present

## 2021-01-20 DIAGNOSIS — M542 Cervicalgia: Secondary | ICD-10-CM | POA: Diagnosis not present

## 2021-01-20 DIAGNOSIS — M791 Myalgia, unspecified site: Secondary | ICD-10-CM | POA: Diagnosis not present

## 2021-01-24 ENCOUNTER — Ambulatory Visit: Payer: Medicare PPO | Admitting: Adult Health

## 2021-01-25 ENCOUNTER — Ambulatory Visit: Payer: Medicare PPO | Admitting: Nurse Practitioner

## 2021-01-25 ENCOUNTER — Ambulatory Visit: Payer: Medicare PPO | Admitting: Adult Health

## 2021-01-27 ENCOUNTER — Ambulatory Visit: Payer: Medicare PPO | Admitting: Hematology and Oncology

## 2021-01-27 ENCOUNTER — Other Ambulatory Visit: Payer: Medicare PPO

## 2021-01-28 ENCOUNTER — Other Ambulatory Visit: Payer: Self-pay | Admitting: Adult Health

## 2021-01-28 ENCOUNTER — Telehealth: Payer: Self-pay | Admitting: *Deleted

## 2021-01-28 NOTE — Telephone Encounter (Signed)
-----   Message from Loren Racer, RN sent at 01/20/2021  3:09 PM EDT ----- Sleep study ordered

## 2021-01-28 NOTE — Telephone Encounter (Signed)
Prior Authorization for SPLIT NIGHT sent to [facility] via web portal. Tracking Number  PROCEDURE IS NOT AUTHORIZED - CASE REQUIRES CLINICAL REVIEW.

## 2021-01-31 ENCOUNTER — Other Ambulatory Visit: Payer: Self-pay

## 2021-01-31 ENCOUNTER — Inpatient Hospital Stay: Payer: Medicare PPO | Admitting: Hematology and Oncology

## 2021-01-31 ENCOUNTER — Encounter: Payer: Self-pay | Admitting: Internal Medicine

## 2021-01-31 ENCOUNTER — Inpatient Hospital Stay: Payer: Medicare PPO | Attending: Hematology and Oncology

## 2021-01-31 ENCOUNTER — Encounter: Payer: Self-pay | Admitting: Hematology and Oncology

## 2021-01-31 VITALS — BP 110/80 | HR 93 | Temp 97.2°F | Resp 17 | Wt 159.2 lb

## 2021-01-31 DIAGNOSIS — D509 Iron deficiency anemia, unspecified: Secondary | ICD-10-CM | POA: Diagnosis not present

## 2021-01-31 DIAGNOSIS — I10 Essential (primary) hypertension: Secondary | ICD-10-CM

## 2021-01-31 DIAGNOSIS — D696 Thrombocytopenia, unspecified: Secondary | ICD-10-CM | POA: Diagnosis not present

## 2021-01-31 DIAGNOSIS — E039 Hypothyroidism, unspecified: Secondary | ICD-10-CM | POA: Diagnosis not present

## 2021-01-31 LAB — CBC WITH DIFFERENTIAL/PLATELET
Abs Immature Granulocytes: 0.03 10*3/uL (ref 0.00–0.07)
Basophils Absolute: 0 10*3/uL (ref 0.0–0.1)
Basophils Relative: 0 %
Eosinophils Absolute: 0.1 10*3/uL (ref 0.0–0.5)
Eosinophils Relative: 2 %
HCT: 41.7 % (ref 36.0–46.0)
Hemoglobin: 13.8 g/dL (ref 12.0–15.0)
Immature Granulocytes: 1 %
Lymphocytes Relative: 29 %
Lymphs Abs: 1.4 10*3/uL (ref 0.7–4.0)
MCH: 30.6 pg (ref 26.0–34.0)
MCHC: 33.1 g/dL (ref 30.0–36.0)
MCV: 92.5 fL (ref 80.0–100.0)
Monocytes Absolute: 0.6 10*3/uL (ref 0.1–1.0)
Monocytes Relative: 12 %
Neutro Abs: 2.8 10*3/uL (ref 1.7–7.7)
Neutrophils Relative %: 56 %
Platelets: 70 10*3/uL — ABNORMAL LOW (ref 150–400)
RBC: 4.51 MIL/uL (ref 3.87–5.11)
RDW: 12.1 % (ref 11.5–15.5)
WBC: 5 10*3/uL (ref 4.0–10.5)
nRBC: 0 % (ref 0.0–0.2)

## 2021-01-31 LAB — TSH: TSH: 1.426 u[IU]/mL (ref 0.308–3.960)

## 2021-01-31 NOTE — Progress Notes (Signed)
[name], [name], [name]  CHIEF COMPLAINTS/PURPOSE OF CONSULTATION:  Thrombocytopenia  ASSESSMENT & PLAN:   This is a 75 yr old female patient with PMH significant for HTN, dyslipidemia referred to hematology for evaluation and follow up of thrombocytopenia.   She has undergone evaluation, no clear evidence of nutritional deficiencies, hepatitis or hypothyroidism.  No known Liver disease. BMB showed hypercellularity, no evidence of dyspoeisis, megakaryocytic hyperplasia. We discussed that this could be an early bone marrow disorder vs ITP Since her last visit, she appears to have started on Eliquis for atrial fibrillation and CHA2DS2-VASc score of 3.  Given her moderate thrombocytopenia and platelet count barely crossing 50-60,000 in the past several months, I am worried about concomitant use of Eliquis and increased risk of bleeding. I have sent an in basket message to her cardiology team and plan was to proceed with watch man device and reassess the need for anticoagulation. In the mean time, she continues to have platelet count of 70 K today. I understand that her benefit of anticoagulation may outweigh her risks at this time. She was instructed to let us know about worrisome bleeding or intractable headaches or go to the nearest hospital. She understands the risk of bleeding. She was asked to refrain from using aspirin or NSAIDs. She will RTC in 4 weeks with repeat labs.  HISTORY OF PRESENTING ILLNESS:  Jillian Rodgers 75 y.o. female is here because of thrombocytopenia.  History from initial visit  75 year old female patient with past medical history significant for hypertension, hypothyroidism, dyslipidemia referred to hematology for evaluation of thrombocytopenia. No nutritional deficiency No thyroid disorder Hepatitis  panel non reactive BMB showed hypercellular marrow for age with trilineage hematopoiesis. Flow negative or any monoclonal B cell population Iron def noted. Cytogenetics normal.  Interim History  She is here for follow up on thrombocytopenia and IDA Since last visit, she went to the cardiologist, watchman device to be placed today. She also was asked to schedule a sleep test. No bleeding complaints so far. No B symptoms. No change in breathing No change in urinary habits.  REVIEW OF SYSTEMS:    Constitutional: Denies fevers, chills or abnormal night sweats Eyes: Denies blurriness of vision, double vision or watery eyes Ears, nose, mouth, throat, and face: Denies mucositis or sore throat Respiratory: Denies cough,  Cardiovascular: Denies palpitation, chest discomfort or lower extremity swelling Gastrointestinal:  Denies nausea, heartburn or change in bowel habits Skin: Denies abnormal skin rashes Lymphatics: Denies new lymphadenopathy or easy bruising Neurological:Denies numbness, tingling or new weaknesses Behavioral/Psych: Mood is stable, no new changes  All other systems were reviewed with the patient and are negative.  MEDICAL HISTORY:  Past Medical History:  Diagnosis Date   Anemia with low platelet count (HCC)    Degenerative joint disease    GERD (gastroesophageal reflux disease)    History of kidney stones    Hyperlipidemia    Hypertension    off of b/p meds due to blood pressure fluctuating - discontinued by DR Melford Aase    Hypothyroidism    Inguinal hernia 02/19/2019   Migraines    neurontin helps   Pneumonia    hx of walking pneumonia     SURGICAL HISTORY: Past Surgical History:  Procedure Laterality Date   BILATERAL CATARACT SURGERY      BLERPHOROPLASTY \     CHOLECYSTECTOMY  1996  open   COLONOSCOPY  2009   recommended 10 year f/u   [place]   umb hernia rpr   HERNIA REPAIR  2005 & 2009   ventral hernia rpr   INGUINAL HERNIA REPAIR Left  04/30/2020   Procedure: OPEN LEFT INGUINAL HERNIA REPAIR WITH MESH;  Surgeon: Clovis Riley, MD;  Location: [facility];  Service: General;  Laterality: Left;   PILONIDAL CYST EXCISION  1972   SKIN CANCER REMOVAL      TONSILLECTOMY     UPPER GI ENDOSCOPY     VENTRAL HERNIA REPAIR      SOCIAL HISTORY: Social History   Socioeconomic History   Marital status: Married    Spouse name: Not on file   Number of children: 2   Years of education: 18   Highest education level: Not on file  Occupational History   Not on file  Tobacco Use   Smoking status: Never   Smokeless tobacco: Never  Vaping Use   Vaping Use: Never used  Substance and Sexual Activity   Alcohol use: No   Drug use: No   Sexual activity: Yes  Other Topics Concern   Not on file  Social History Narrative   Not on file   Social Determinants of Health   Financial Resource Strain: Not on file  Food Insecurity: Not on file  Transportation Needs: Not on file  Physical Activity: Not on file  Stress: Not on file  Social Connections: Not on file  Intimate Partner Violence: Not on file    FAMILY HISTORY: Family History  Problem Relation Age of Onset   Ovarian cancer Mother    Heart disease Mother    Cancer Mother    Uterine cancer Mother    Hypertension Mother    Breast cancer Sister    Stroke Paternal Uncle    Congestive Heart Failure Father    Gout Maternal Aunt    Colon cancer Neg Hx    Esophageal cancer Neg Hx    Stomach cancer Neg Hx    Rectal cancer Neg Hx     ALLERGIES:  has No Known Allergies.  MEDICATIONS:  Current Outpatient Medications  Medication Sig Dispense Refill   apixaban (ELIQUIS) 5 MG TABS tablet Take 1 tablet (5 mg total) by mouth 2 (two) times daily. 60 tablet 6   Ascorbic Acid (VITAMIN C) 500 MG CAPS Take 500 mg by mouth daily.      BOTOX 100 units SOLR injection      cetirizine (ZYRTEC) 10 MG tablet Take 10 mg by mouth daily.     Cholecalciferol (VITAMIN D PO) Take 5,000 Units by  mouth daily.     gabapentin (NEURONTIN) 600 MG tablet Take 900-1,200 mg by mouth See admin instructions. Take 900 mg by mouth in morning and 1200 mg at bedtime     levothyroxine (SYNTHROID) 50 MCG tablet Take  1 tablet  Daily  on an empty stomach with only water for 30 minutes & no Antacid meds, Calcium or Magnesium for 4 hours & avoid Biotin 90 tablet 3   oxybutynin (DITROPAN) 5 MG tablet TAKE 1 TABLET BY MOUTH AT BEDTIME AS NEEDED FOR OVERACTIVE BLADDER SYMPTOMS 90 tablet 0   pantoprazole (PROTONIX) 40 MG tablet Take 1 tablet Daily for Acid Indigestion & Heartburn 90 tablet 0   pravastatin (PRAVACHOL) 40 MG tablet TAKE 1 TABLET BY MOUTH AT BEDTIME FOR CHOLESTEROL 90 tablet 3   zinc gluconate 50 MG tablet Take 50  mg by mouth daily.     No current facility-administered medications for this visit.     PHYSICAL EXAMINATION: ECOG PERFORMANCE STATUS: 0 - Asymptomatic  Vitals:   01/31/21 0838  BP: 110/80  Pulse: 93  Resp: 17  Temp: (!) 97.2 F (36.2 C)  SpO2: 100%   Filed Weights   01/31/21 0838  Weight: 159 lb 3 oz (72.2 kg)    GENERAL:alert, no distress and comfortable  Physical Exam Constitutional:      Appearance: She is normal weight.  HENT:     Head: Normocephalic and atraumatic.     Mouth/Throat:     Mouth: Mucous membranes are moist.     Pharynx: Oropharynx is clear.  Cardiovascular:     Rate and Rhythm: Normal rate and regular rhythm.     Pulses: Normal pulses.     Heart sounds: Normal heart sounds.  Pulmonary:     Effort: Pulmonary effort is normal.     Breath sounds: Normal breath sounds.  Abdominal:     General: Abdomen is flat.     Palpations: Abdomen is soft.  Musculoskeletal:        General: No swelling (mild ankle edema with broken veins) or tenderness.     Cervical back: Normal range of motion and neck supple. No rigidity.  Skin:    General: Skin is warm and dry.  Neurological:     Mental Status: She is alert.  Psychiatric:        Mood and Affect:  Mood normal.     LABORATORY DATA:  I have reviewed the data as listed Lab Results  Component Value Date   WBC 5.0 01/31/2021   HGB 13.8 01/31/2021   HCT 41.7 01/31/2021   MCV 92.5 01/31/2021   PLT 70 (L) 01/31/2021     Chemistry      Component Value Date/Time   NA 139 10/25/2020 1229   K 3.9 10/25/2020 1229   CL 105 10/25/2020 1229   CO2 23 10/25/2020 1229   BUN 21 10/25/2020 1229   CREATININE 1.06 (H) 10/25/2020 1229   CREATININE 1.17 (H) 10/20/2020 1542      Component Value Date/Time   CALCIUM 10.2 10/25/2020 1229   ALKPHOS 81 09/30/2020 1455   AST 16 10/20/2020 1542   AST 18 09/30/2020 1455   ALT 15 10/20/2020 1542   ALT 14 09/30/2020 1455   BILITOT 0.4 10/20/2020 1542   BILITOT 0.5 09/30/2020 1455     I have reviewed labs for the past 10 yrs. no thrombocytopenia in 2009.   There were no labs between 2009 and 2014.   Platelet count was 120,000 in 2014 and has progressively declined over the past several years. Slowly progressive thrombocytopenia over the past 5/6 yrs,  Platelet count today is 70 K  RADIOGRAPHIC STUDIES: I have personally reviewed the radiological images as listed and agreed with the findings in the report. No results found.  I have reviewed bone marrow biopsy reports. I spent 20 minutes in the care of this patient , once again discussed about potential issues with concomitant use of anticoagulation as well as thrombocytopenia, discussed about symptoms and signs to monitor for. Coordinated with cardiology.   Benay Pike, MD 01/31/2021 9:37 AM

## 2021-01-31 NOTE — Patient Instructions (Signed)

## 2021-01-31 NOTE — Progress Notes (Addendum)
Future Appointments  Date Time Provider [place]  02/01/2021  2:30 PM Unk Pinto, MD [facility] None  03/08/2021 10:50 AM Lincoln Brigham, PA-C [facility] None  03/23/2021 10:20 AM Belva Crome, MD [facility]  07/18/2021           -     CPE   2:00 PM Unk Pinto, MD [facility] None  10/20/2021           -    Wellness  2:30 PM Liane Comber, NP [facility] None    History of Present Illness:       This very nice 75 y.o. MWF  presents for 6 month follow up with HTN, HLD, Hypothyroidism, Pre-Diabetes and Vitamin D Deficiency. Patient has GERD controlled on her meds.  Patient is followed by Dr Arletha Pili Iruku  (Heme/Onc) for Anemia/Thrombocytopenia.        Patient has hx/o labile HTN  (1990's) & BP has been controlled at home. Today's BP was initially sl elevated & rechecked at goal-  140/90.  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  On October 25, 2020, patient was discovered in AFib & CV by Dr Sherwood Gambler & started on Eliquis (CHA2Ds2VASc= 3) Patient has had no complaints of any cardiac type chest pain, palpitations, dyspnea / orthopnea / PND, dizziness, claudication, or dependent edema.       Hyperlipidemia is controlled with diet & Pravastatin . Patient denies myalgias or other med SE's. Last Lipids were  Lab Results  Component Value Date   CHOL 177 10/20/2020   HDL 63 10/20/2020   LDLCALC 84 10/20/2020   TRIG 210 (H) 10/20/2020   CHOLHDL 2.8 10/20/2020        Patient has been on Thyroid Replacement since the 1990's.    Also, the patient has history of PreDiabetes (A1c 6.0% /2012) and has had no symptoms of reactive hypoglycemia, diabetic polys, paresthesias or visual blurring.  Last A1c was Normal & at goal:  Lab Results  Component Value Date   HGBA1C 5.5 07/12/2020                                                          Further, the patient also has history of Vitamin D Deficiency ("39"  /2012) and supplements vitamin D without any suspected side-effects. Last vitamin D was at goal:  Lab Results  Component Value Date   VD25OH 83 07/12/2020     Current Outpatient Medications on File Prior to Visit  Medication Sig   ELIQUIS 5 MG TABS Take 1 tablet  2  times daily.   VITAMIN C  500 MG CAPS Take  daily.    BOTOX 100 units SOLR inject    cetirizine  10 MG tablet Take  daily.   VITAMIN D 5,000 Units  Take  daily.   gabapentin  600 MG tablet Take 900 mg -morning and 1200 mg -bedtime   levothyroxine 50 MCG tablet Take  1 tablet  Daily   oxybutynin 5 MG tablet TAKE 1 TABLET AT BEDTIME    pantoprazole 40 MG tablet Take 1 tablet Daily f   pravastatin 40 MG tablet TAKE 1 TABLET  AT BEDTIME    zinc 50 MG tablet Take daily.  No Known Allergies   PMHx:   Past Medical History:  Diagnosis Date   Anemia with low platelet count (HCC)    Degenerative joint disease    GERD (gastroesophageal reflux disease)    History of kidney stones    Hyperlipidemia    Hypertension    off of b/p meds due to blood pressure fluctuating - discontinued by DR Melford Aase    Hypothyroidism    Inguinal hernia 02/19/2019   Migraines    neurontin helps   Pneumonia    hx of walking pneumonia      Immunization History  Administered Date(s) Administered   Influenza Inj Mdck Quad 01/15/2018   Influenza, High Dose  02/07/2018, 12/30/2018, 02/12/2020   Influenza 01/24/2011, 02/02/2012, 01/16/2015   PFIZER SARS-COV-2 Vacc 05/12/2019, 06/02/2019, 01/22/2020, 07/23/2020   Pneumococcal -13 10/28/2014   Pneumococcal-23 11/28/2011   Td 11/17/2010   Tdap 11/12/2014   Zoster, Live 11/17/2010     Past Surgical History:  Procedure Laterality Date   BILATERAL CATARACT SURGERY      BLERPHOROPLASTY \     CHOLECYSTECTOMY  1996   open   COLONOSCOPY  2009   recommended 10 year f/u   HERNIA REPAIR  1985   umb hernia rpr   HERNIA REPAIR  2005 & 2009   ventral hernia rpr   INGUINAL HERNIA REPAIR Left  04/30/2020   Procedure: OPEN LEFT INGUINAL HERNIA REPAIR WITH MESH;  Surgeon: Clovis Riley, MD;  Location: [facility];  Service: General;  Laterality: Left;   PILONIDAL CYST EXCISION  1972   SKIN CANCER REMOVAL      TONSILLECTOMY     UPPER GI ENDOSCOPY     VENTRAL HERNIA REPAIR      FHx:    Reviewed / unchanged  SHx:    Reviewed / unchanged   Systems Review:  Constitutional: Denies fever, chills, wt changes, headaches, insomnia, fatigue, night sweats, change in appetite. Eyes: Denies redness, blurred vision, diplopia, discharge, itchy, watery eyes.  ENT: Denies discharge, congestion, post nasal drip, epistaxis, sore throat, earache, hearing loss, dental pain, tinnitus, vertigo, sinus pain, snoring.  CV: Denies chest pain, palpitations, irregular heartbeat, syncope, dyspnea, diaphoresis, orthopnea, PND, claudication or edema. Respiratory: denies cough, dyspnea, DOE, pleurisy, hoarseness, laryngitis, wheezing.  Gastrointestinal: Denies dysphagia, odynophagia, heartburn, reflux, water brash, abdominal pain or cramps, nausea, vomiting, bloating, diarrhea, constipation, hematemesis, melena, hematochezia  or hemorrhoids. Genitourinary: Denies dysuria, frequency, urgency, nocturia, hesitancy, discharge, hematuria or flank pain. Musculoskeletal: Denies arthralgias, myalgias, stiffness, jt. swelling, pain, limping or strain/sprain.  Skin: Denies pruritus, rash, hives, warts, acne, eczema or change in skin lesion(s). Neuro: No weakness, tremor, incoordination, spasms, paresthesia or pain. Psychiatric: Denies confusion, memory loss or sensory loss. Endo: Denies change in weight, skin or hair change.  Heme/Lymph: No excessive bleeding, bruising or enlarged lymph nodes.  Physical Exam  BP 140/90   Pulse 60   Temp 97.9 F (36.6 C)   Resp 16   Ht 5\' 3"  (1.6 m)   Wt 161 lb 6.4 oz (73.2 kg)   SpO2 97%   BMI 28.59 kg/m   Appears  well nourished, well groomed  and in no distress.  Eyes:  PERRLA, EOMs, conjunctiva no swelling or erythema. Sinuses: No frontal/maxillary tenderness ENT/Mouth: EAC's clear, TM's nl w/o erythema, bulging. Nares clear w/o erythema, swelling, exudates. Oropharynx clear without erythema or exudates. Oral hygiene is good. Tongue normal, non obstructing. Hearing intact.  Neck: Supple. Thyroid not palpable. Car 2+/2+ without bruits, nodes or  JVD. Chest: Respirations nl with BS clear & equal w/o rales, rhonchi, wheezing or stridor.  Cor: Heart sounds normal w/ regular rate and rhythm without sig. murmurs, gallops, clicks or rubs. Peripheral pulses normal and equal  without edema.  Abdomen: Soft & bowel sounds normal. Non-tender w/o guarding, rebound, hernias, masses or organomegaly.  Lymphatics: Unremarkable.  Musculoskeletal: Full ROM all peripheral extremities, joint stability, 5/5 strength and normal gait.  Skin: Warm, dry without exposed rashes, lesions or ecchymosis apparent.  Neuro: Cranial nerves intact, reflexes equal bilaterally. Sensory-motor testing grossly intact. Tendon reflexes grossly intact.  Pysch: Alert & oriented x 3.  Insight and judgement nl & appropriate. No ideations.  Assessment and Plan:  1. Labile hypertension  - Continue medication, monitor blood pressure at home.  - Continue DASH diet.  Reminder to go to the ER if any CP,  SOB, nausea, dizziness, severe HA, changes vision/speech.    - COMPLETE METABOLIC PANEL WITH GFR - Magnesium  2. Hyperlipidemia, mixed  - Continue diet/meds, exercise,& lifestyle modifications.  - Continue monitor periodic cholesterol/liver & renal functions     - Lipid panel  3. Abnormal glucose  - Continue diet, exercise  - Lifestyle modifications.  - Monitor appropriate labs   - Hemoglobin A1c - Insulin, random  4. Vitamin D deficiency  - Continue supplementation  - VITAMIN D 25 Hydroxy   5. Paroxysmal atrial fibrillation (HCC)   6. Hypothyroidism   7. Stage 3a chronic kidney  disease (HCC)  - PTH, intact and calcium   8. Medication Management   - COMPLETE METABOLIC PANEL WITH GFR - Magnesium - Lipid panel - Hemoglobin A1c - Insulin, random - VITAMIN D 25 Hydroxy  - PTH, intact and calcium          Discussed  regular exercise, BP monitoring, weight control to achieve/maintain BMI less than 25 and discussed med and SE's. Recommended labs to assess and monitor clinical status with further disposition pending results of labs.  I discussed the assessment and treatment plan with the patient. The patient was provided an opportunity to ask questions and all were answered. The patient agreed with the plan and demonstrated an understanding of the instructions.  I provided over 30 minutes of exam, counseling, chart review and  complex critical decision making.        The patient was advised to call back or seek an in-person evaluation if the symptoms worsen or if the condition fails to improve as anticipated.   Kirtland Bouchard, MD

## 2021-02-01 ENCOUNTER — Ambulatory Visit: Payer: Medicare PPO | Admitting: Internal Medicine

## 2021-02-01 ENCOUNTER — Encounter: Payer: Self-pay | Admitting: Internal Medicine

## 2021-02-01 VITALS — BP 140/90 | HR 60 | Temp 97.9°F | Resp 16 | Ht 63.0 in | Wt 161.4 lb

## 2021-02-01 DIAGNOSIS — E782 Mixed hyperlipidemia: Secondary | ICD-10-CM

## 2021-02-01 DIAGNOSIS — I480 Paroxysmal atrial fibrillation: Secondary | ICD-10-CM

## 2021-02-01 DIAGNOSIS — N1831 Chronic kidney disease, stage 3a: Secondary | ICD-10-CM

## 2021-02-01 DIAGNOSIS — R7309 Other abnormal glucose: Secondary | ICD-10-CM | POA: Diagnosis not present

## 2021-02-01 DIAGNOSIS — E559 Vitamin D deficiency, unspecified: Secondary | ICD-10-CM | POA: Diagnosis not present

## 2021-02-01 DIAGNOSIS — Z1211 Encounter for screening for malignant neoplasm of colon: Secondary | ICD-10-CM

## 2021-02-01 DIAGNOSIS — Z1212 Encounter for screening for malignant neoplasm of rectum: Secondary | ICD-10-CM

## 2021-02-01 DIAGNOSIS — Z23 Encounter for immunization: Secondary | ICD-10-CM

## 2021-02-01 DIAGNOSIS — Z79899 Other long term (current) drug therapy: Secondary | ICD-10-CM

## 2021-02-01 DIAGNOSIS — E039 Hypothyroidism, unspecified: Secondary | ICD-10-CM

## 2021-02-01 DIAGNOSIS — R0989 Other specified symptoms and signs involving the circulatory and respiratory systems: Secondary | ICD-10-CM

## 2021-02-01 DIAGNOSIS — K4090 Unilateral inguinal hernia, without obstruction or gangrene, not specified as recurrent: Secondary | ICD-10-CM | POA: Diagnosis not present

## 2021-02-01 NOTE — Telephone Encounter (Signed)
Home sleep study is fine

## 2021-02-01 NOTE — Telephone Encounter (Signed)
Per clinical review nurse [facility] help, split night denied does not meet comorbiddies. recommend Home Sleep test with APAP Ref# [id number] pending [phone]

## 2021-02-02 ENCOUNTER — Ambulatory Visit (INDEPENDENT_AMBULATORY_CARE_PROVIDER_SITE_OTHER): Payer: Medicare PPO

## 2021-02-02 DIAGNOSIS — I480 Paroxysmal atrial fibrillation: Secondary | ICD-10-CM | POA: Diagnosis not present

## 2021-02-02 LAB — HEMOGLOBIN A1C
Hgb A1c MFr Bld: 5.5 %{Hb}
Mean Plasma Glucose: 111 mg/dL
eAG (mmol/L): 6.2 mmol/L

## 2021-02-02 LAB — PTH, INTACT AND CALCIUM
Calcium: 10.1 mg/dL (ref 8.6–10.4)
PTH: 111 pg/mL — ABNORMAL HIGH (ref 16–77)

## 2021-02-02 LAB — COMPLETE METABOLIC PANEL WITH GFR
AG Ratio: 1.8 (calc) (ref 1.0–2.5)
ALT: 15 U/L (ref 6–29)
AST: 17 U/L (ref 10–35)
Albumin: 4.2 g/dL (ref 3.6–5.1)
Alkaline phosphatase (APISO): 84 U/L (ref 37–153)
BUN/Creatinine Ratio: 19 (calc) (ref 6–22)
BUN: 21 mg/dL (ref 7–25)
CO2: 29 mmol/L (ref 20–32)
Calcium: 10.1 mg/dL (ref 8.6–10.4)
Chloride: 106 mmol/L (ref 98–110)
Creat: 1.09 mg/dL — ABNORMAL HIGH (ref 0.60–1.00)
Globulin: 2.4 g/dL (calc) (ref 1.9–3.7)
Glucose, Bld: 81 mg/dL (ref 65–99)
Potassium: 4.7 mmol/L (ref 3.5–5.3)
Sodium: 140 mmol/L (ref 135–146)
Total Bilirubin: 0.7 mg/dL (ref 0.2–1.2)
Total Protein: 6.6 g/dL (ref 6.1–8.1)
eGFR: 53 mL/min/{1.73_m2} — ABNORMAL LOW (ref >=60)

## 2021-02-02 LAB — LIPID PANEL
Cholesterol: 140 mg/dL
HDL: 58 mg/dL
LDL Cholesterol (Calc): 60 mg/dL
Non-HDL Cholesterol (Calc): 82 mg/dL
Total CHOL/HDL Ratio: 2.4 (calc)
Triglycerides: 135 mg/dL

## 2021-02-02 LAB — INSULIN, RANDOM: Insulin: 6.4 u[IU]/mL

## 2021-02-02 LAB — MAGNESIUM: Magnesium: 2 mg/dL (ref 1.5–2.5)

## 2021-02-02 LAB — VITAMIN D 25 HYDROXY (VIT D DEFICIENCY, FRACTURES): Vit D, 25-Hydroxy: 92 ng/mL (ref 30–100)

## 2021-02-02 NOTE — Progress Notes (Signed)
============================================================ ============================================================  -    Total Chol = 140    &   LDL Chol = 60    - Both     Excellent   - Very low risk for Heart Attack  / Stroke ============================================================ ============================================================  -  A1c - Normal - No Diabetes  - Great !  ============================================================ ============================================================  -  Vitamin D = 92 - Excellent  ! ============================================================ ============================================================  -  PTH - hormone that regulates calcium balance is Normal  ============================================================ ============================================================  -  All Else - CBC - Kidneys - Electrolytes - Liver - Magnesium & Thyroid    - all  Normal / OK ============================================================ ============================================================

## 2021-02-04 NOTE — Telephone Encounter (Signed)
Split night approved [id number]

## 2021-02-11 DIAGNOSIS — L600 Ingrowing nail: Secondary | ICD-10-CM | POA: Diagnosis not present

## 2021-02-11 DIAGNOSIS — I739 Peripheral vascular disease, unspecified: Secondary | ICD-10-CM | POA: Diagnosis not present

## 2021-02-11 DIAGNOSIS — L603 Nail dystrophy: Secondary | ICD-10-CM | POA: Diagnosis not present

## 2021-02-17 DIAGNOSIS — Z1211 Encounter for screening for malignant neoplasm of colon: Secondary | ICD-10-CM | POA: Diagnosis not present

## 2021-02-17 DIAGNOSIS — Z1212 Encounter for screening for malignant neoplasm of rectum: Secondary | ICD-10-CM | POA: Diagnosis not present

## 2021-02-23 DIAGNOSIS — Z01419 Encounter for gynecological examination (general) (routine) without abnormal findings: Secondary | ICD-10-CM | POA: Diagnosis not present

## 2021-02-23 DIAGNOSIS — N858 Other specified noninflammatory disorders of uterus: Secondary | ICD-10-CM | POA: Diagnosis not present

## 2021-02-23 DIAGNOSIS — L539 Erythematous condition, unspecified: Secondary | ICD-10-CM | POA: Diagnosis not present

## 2021-02-23 DIAGNOSIS — Q6472 Congenital prolapse of urinary meatus: Secondary | ICD-10-CM | POA: Diagnosis not present

## 2021-02-24 DIAGNOSIS — Z961 Presence of intraocular lens: Secondary | ICD-10-CM | POA: Diagnosis not present

## 2021-02-24 DIAGNOSIS — H524 Presbyopia: Secondary | ICD-10-CM | POA: Diagnosis not present

## 2021-02-24 LAB — COLOGUARD: COLOGUARD: NEGATIVE

## 2021-02-24 NOTE — Progress Notes (Signed)
============================================================== ==============================================================  -    Cologard Returned & is Negative !  - Terrific ! - Recommend repeat in 3 years !  ============================================================== ==============================================================

## 2021-02-28 NOTE — Telephone Encounter (Signed)
Patient is scheduled for lab study on 03/04/21. Patient understands her sleep study will be done at [facility] sleep lab. Patient understands she will receive a sleep packet in a week or so. Patient understands to call if she does not receive the sleep packet in a timely manner. Patient agrees with treatment and thanked me for call.

## 2021-03-03 NOTE — Progress Notes (Signed)
LMOM. -e welch

## 2021-03-04 ENCOUNTER — Other Ambulatory Visit: Payer: Self-pay

## 2021-03-04 ENCOUNTER — Ambulatory Visit (HOSPITAL_BASED_OUTPATIENT_CLINIC_OR_DEPARTMENT_OTHER): Payer: Medicare PPO | Attending: Interventional Cardiology | Admitting: Cardiology

## 2021-03-04 DIAGNOSIS — G4736 Sleep related hypoventilation in conditions classified elsewhere: Secondary | ICD-10-CM | POA: Insufficient documentation

## 2021-03-04 DIAGNOSIS — G4733 Obstructive sleep apnea (adult) (pediatric): Secondary | ICD-10-CM | POA: Insufficient documentation

## 2021-03-04 DIAGNOSIS — R0683 Snoring: Secondary | ICD-10-CM | POA: Diagnosis not present

## 2021-03-07 ENCOUNTER — Other Ambulatory Visit: Payer: Self-pay | Admitting: Physician Assistant

## 2021-03-07 DIAGNOSIS — D696 Thrombocytopenia, unspecified: Secondary | ICD-10-CM

## 2021-03-08 ENCOUNTER — Inpatient Hospital Stay: Payer: Medicare PPO | Admitting: Physician Assistant

## 2021-03-08 ENCOUNTER — Inpatient Hospital Stay: Payer: Medicare PPO | Attending: Hematology and Oncology

## 2021-03-08 ENCOUNTER — Other Ambulatory Visit: Payer: Self-pay

## 2021-03-08 VITALS — BP 111/73 | HR 72 | Temp 97.8°F | Resp 17 | Ht 63.0 in | Wt 153.2 lb

## 2021-03-08 DIAGNOSIS — D696 Thrombocytopenia, unspecified: Secondary | ICD-10-CM

## 2021-03-08 LAB — CBC WITH DIFFERENTIAL (CANCER CENTER ONLY)
Abs Immature Granulocytes: 0.01 10*3/uL (ref 0.00–0.07)
Basophils Absolute: 0 10*3/uL (ref 0.0–0.1)
Basophils Relative: 1 %
Eosinophils Absolute: 0.9 10*3/uL — ABNORMAL HIGH (ref 0.0–0.5)
Eosinophils Relative: 14 %
HCT: 38.7 % (ref 36.0–46.0)
Hemoglobin: 12.7 g/dL (ref 12.0–15.0)
Immature Granulocytes: 0 %
Lymphocytes Relative: 24 %
Lymphs Abs: 1.6 10*3/uL (ref 0.7–4.0)
MCH: 30.5 pg (ref 26.0–34.0)
MCHC: 32.8 g/dL (ref 30.0–36.0)
MCV: 92.8 fL (ref 80.0–100.0)
Monocytes Absolute: 0.6 10*3/uL (ref 0.1–1.0)
Monocytes Relative: 9 %
Neutro Abs: 3.4 10*3/uL (ref 1.7–7.7)
Neutrophils Relative %: 52 %
Platelet Count: 63 10*3/uL — ABNORMAL LOW (ref 150–400)
RBC: 4.17 MIL/uL (ref 3.87–5.11)
RDW: 12.3 % (ref 11.5–15.5)
WBC Count: 6.5 10*3/uL (ref 4.0–10.5)
nRBC: 0 % (ref 0.0–0.2)

## 2021-03-08 LAB — CMP (CANCER CENTER ONLY)
ALT: 13 U/L (ref 0–44)
AST: 18 U/L (ref 15–41)
Albumin: 3.8 g/dL (ref 3.5–5.0)
Alkaline Phosphatase: 89 U/L (ref 38–126)
Anion gap: 7 (ref 5–15)
BUN: 21 mg/dL (ref 8–23)
CO2: 26 mmol/L (ref 22–32)
Calcium: 10.2 mg/dL (ref 8.9–10.3)
Chloride: 107 mmol/L (ref 98–111)
Creatinine: 1.18 mg/dL — ABNORMAL HIGH (ref 0.44–1.00)
GFR, Estimated: 48 mL/min — ABNORMAL LOW (ref >60)
Glucose, Bld: 73 mg/dL (ref 70–99)
Potassium: 4.3 mmol/L (ref 3.5–5.1)
Sodium: 140 mmol/L (ref 135–145)
Total Bilirubin: 0.6 mg/dL (ref 0.3–1.2)
Total Protein: 6.6 g/dL (ref 6.5–8.1)

## 2021-03-08 NOTE — Progress Notes (Signed)
[place] [phone]   [phone]  PROGRESS NOTE  Patient Care Team: Unk Pinto, [name], [name]   CHIEF COMPLAINTS/PURPOSE OF CONSULTATION:  Thrombocytopenia  HISTORY OF PRESENTING ILLNESS:  Jillian Rodgers 75 y.o. female returns for a follow up for thrombocytopenia.   On exam today, Jillian Rodgers reports her energy levels are fairly stable.  She has intermittent episodes of fatigue would continue to complete all her daily activities on her own.  She has a good appetite and adds that she is trying to lose weight.  She has lost approximately 7 pounds since last visit.  She denies any nausea, vomiting or abdominal pain.  Her bowel habits are fairly stable.  She denies any easy bruising or signs of bleeding.  This includes hematochezia or melena.  Patient denies fevers, chills, night sweats, shortness of breath, chest pain or cough.  She has no other complaints.  Rest the 10 point ROS is below.  MEDICAL HISTORY:  Past Medical History:  Diagnosis Date   Anemia with low platelet count (HCC)    Degenerative joint disease    GERD (gastroesophageal reflux disease)    History of kidney stones    Hyperlipidemia    Hypertension    off of b/p meds due to blood pressure fluctuating - discontinued by DR Melford Aase    Hypothyroidism    Inguinal hernia 02/19/2019   Migraines    neurontin helps   Pneumonia    hx of walking pneumonia     SURGICAL HISTORY: Past Surgical History:  Procedure Laterality Date   BILATERAL CATARACT SURGERY      BLERPHOROPLASTY \     CHOLECYSTECTOMY  1996   open   COLONOSCOPY  2009   recommended 10 year f/u   HERNIA REPAIR  1985   umb hernia rpr   HERNIA REPAIR  2005 & 2009   ventral hernia rpr   INGUINAL HERNIA REPAIR Left 04/30/2020   Procedure: OPEN LEFT INGUINAL HERNIA REPAIR WITH MESH;  Surgeon: Clovis Riley, MD;   Location: [facility];  Service: General;  Laterality: Left;   PILONIDAL CYST EXCISION  1972   SKIN CANCER REMOVAL      TONSILLECTOMY     UPPER GI ENDOSCOPY     VENTRAL HERNIA REPAIR      SOCIAL HISTORY: Social History   Socioeconomic History   Marital status: Married    Spouse name: Not on file   Number of children: 2   Years of education: 18   Highest education level: Not on file  Occupational History   Not on file  Tobacco Use   Smoking status: Never   Smokeless tobacco: Never  Vaping Use   Vaping Use: Never used  Substance and Sexual Activity   Alcohol use: No   Drug use: No   Sexual activity: Yes  Other Topics Concern   Not on file  Social History Narrative   Not on file   Social Determinants of Health   Financial Resource Strain: Not on file  Food Insecurity: Not on file  Transportation Needs: Not on file  Physical Activity: Not on file  Stress: Not on file  Social Connections: Not on file  Intimate Partner Violence: Not on file    FAMILY HISTORY: Family History  Problem Relation Age of Onset   Ovarian cancer Mother    Heart disease Mother    Cancer Mother  Uterine cancer Mother    Hypertension Mother    Breast cancer Sister    Stroke Paternal Uncle    Congestive Heart Failure Father    Gout Maternal Aunt    Colon cancer Neg Hx    Esophageal cancer Neg Hx    Stomach cancer Neg Hx    Rectal cancer Neg Hx     ALLERGIES:  has No Known Allergies.  MEDICATIONS:  Current Outpatient Medications  Medication Sig Dispense Refill   Ascorbic Acid (VITAMIN C) 500 MG CAPS Take 500 mg by mouth daily.      BOTOX 100 units SOLR injection      cetirizine (ZYRTEC) 10 MG tablet Take 10 mg by mouth daily.     Cholecalciferol (VITAMIN D PO) Take 5,000 Units by mouth daily.     gabapentin (NEURONTIN) 600 MG tablet Take 900-1,200 mg by mouth See admin instructions. Take 900 mg by mouth in morning and 1200 mg at bedtime     levothyroxine (SYNTHROID) 50 MCG tablet  Take  1 tablet  Daily  on an empty stomach with only water for 30 minutes & no Antacid meds, Calcium or Magnesium for 4 hours & avoid Biotin 90 tablet 3   pantoprazole (PROTONIX) 40 MG tablet Take 1 tablet Daily for Acid Indigestion & Heartburn 90 tablet 0   pravastatin (PRAVACHOL) 40 MG tablet TAKE 1 TABLET BY MOUTH AT BEDTIME FOR CHOLESTEROL 90 tablet 3   zinc gluconate 50 MG tablet Take 50 mg by mouth daily.     apixaban (ELIQUIS) 5 MG TABS tablet Take 1 tablet (5 mg total) by mouth 2 (two) times daily. 60 tablet 6   oxybutynin (DITROPAN) 5 MG tablet TAKE 1 TABLET BY MOUTH AT BEDTIME AS NEEDED FOR OVERACTIVE BLADDER SYMPTOMS (Patient not taking: Reported on 02/01/2021) 90 tablet 0   No current facility-administered medications for this visit.    REVIEW OF SYSTEMS:   Constitutional: ( - ) fevers, ( - )  chills , ( - ) night sweats Eyes: ( - ) blurriness of vision, ( - ) double vision, ( - ) watery eyes Ears, nose, mouth, throat, and face: ( - ) mucositis, ( - ) sore throat Respiratory: ( - ) cough, ( - ) dyspnea, ( - ) wheezes Cardiovascular: ( - ) palpitation, ( - ) chest discomfort, ( - ) lower extremity swelling Gastrointestinal:  ( - ) nausea, ( - ) heartburn, ( - ) change in bowel habits Skin: ( - ) abnormal skin rashes Lymphatics: ( - ) new lymphadenopathy, ( - ) easy bruising Neurological: ( - ) numbness, ( - ) tingling, ( - ) new weaknesses Behavioral/Psych: ( - ) mood change, ( - ) new changes  All other systems were reviewed with the patient and are negative.  PHYSICAL EXAMINATION: ECOG PERFORMANCE STATUS: 0 - Asymptomatic  Vitals:   03/08/21 1110  BP: 111/73  Pulse: 72  Resp: 17  Temp: 97.8 F (36.6 C)  SpO2: 97%   Filed Weights   03/08/21 1110  Weight: 153 lb 3.2 oz (69.5 kg)    GENERAL: well appearing female in NAD  SKIN: skin color, texture, turgor are normal, no rashes or significant lesions EYES: conjunctiva are pink and non-injected, sclera  clear OROPHARYNX: no exudate, no erythema; lips, buccal mucosa, and tongue normal  NECK: supple, non-tender LYMPH:  no palpable lymphadenopathy in the cervical or supraclavicular lymph nodes.  LUNGS: clear to auscultation and percussion with normal breathing effort HEART: regular  rate & rhythm and no murmurs and no lower extremity edema ABDOMEN: soft, non-tender, non-distended, normal bowel sounds Musculoskeletal: no cyanosis of digits and no clubbing  PSYCH: alert & oriented x 3, fluent speech NEURO: no focal motor/sensory deficits  LABORATORY DATA:  I have reviewed the data as listed CBC Latest Ref Rng & Units 03/08/2021 01/31/2021 12/30/2020  WBC 4.0 - 10.5 K/uL 6.5 5.0 5.5  Hemoglobin 12.0 - 15.0 g/dL 12.7 13.8 13.1  Hematocrit 36.0 - 46.0 % 38.7 41.7 39.0  Platelets 150 - 400 K/uL 63(L) 70(L) 60(L)    CMP Latest Ref Rng & Units 03/08/2021 02/01/2021 02/01/2021  Glucose 70 - 99 mg/dL 73 - 81  BUN 8 - 23 mg/dL 21 - 21  Creatinine 0.44 - 1.00 mg/dL 1.18(H) - 1.09(H)  Sodium 135 - 145 mmol/L 140 - 140  Potassium 3.5 - 5.1 mmol/L 4.3 - 4.7  Chloride 98 - 111 mmol/L 107 - 106  CO2 22 - 32 mmol/L 26 - 29  Calcium 8.9 - 10.3 mg/dL 10.2 10.1 10.1  Total Protein 6.5 - 8.1 g/dL 6.6 - 6.6  Total Bilirubin 0.3 - 1.2 mg/dL 0.6 - 0.7  Alkaline Phos 38 - 126 U/L 89 - -  AST 15 - 41 U/L 18 - 17  ALT 0 - 44 U/L 13 - 15    ASSESSMENT & PLAN Simrit Gohlke is a 75 y.o. female who presents for a follow up for thrombocytopenia.   #Thrombocytopenia: --Patient has undergone serologic workup on November 2021 without no evidence of hepatitis, hypothyroidism, nutritional deficiencies.  --MR of the abdomen from May 2021 shows no evidence of liver disease or splenomegaly.  --Bone marrow biopsy from 03/19/2020 showed hypercellularity, no evidence of dyspoeisis, megakaryocytic hyperplasia.  --Flow cytometry negative for any monoclonal B cell population. Cytogenetics was normal.  --Labs today  show overall stable platelet count at 63K. CMP shows mild increase in creatinine levels to 1.18, encouraged increase intake of oral fluids. MMA levels and homocysteine level are pending today. --Likely etiology is immune mediated versus early bone marrow disorder. Continue to monitor for now as platelet count has been stable for several years.  --Strict precautions for bleeding were given. She will refrain from using aspirin or NSAIDs.  --She is currently on Eliquis for A.fib. Plan to continue at this time as per her cardiology team.  --RTC in 3 months with labs  Orders Placed This Encounter  Procedures   CBC with Differential ([place] Only)    Standing Status:   Future    Standing Expiration Date:   03/08/2022   CMP ([place] only)    Standing Status:   Future    Standing Expiration Date:   03/08/2022     All questions were answered. The patient knows to call the clinic with any problems, questions or concerns.  I have spent a total of 25 minutes minutes of face-to-face and non-face-to-face time, preparing to see the patient, performing a medically appropriate examination, counseling and educating the patient, documenting clinical information in the electronic health record, and care coordination.   Dede Query, PA-C Department of Hematology/Oncology [place] at [facility] Phone: [phone]

## 2021-03-09 LAB — HOMOCYSTEINE: Homocysteine: 17.8 umol/L (ref 0.0–19.2)

## 2021-03-11 LAB — METHYLMALONIC ACID, SERUM: Methylmalonic Acid, Quantitative: 178 nmol/L (ref 0–378)

## 2021-03-13 NOTE — Progress Notes (Deleted)
Cardiology Office Note:    Date:  03/13/2021   ID:  Jillian, Rodgers 02/13/1946, MRN [id number]  PCP:  Unk Pinto, MD  Cardiologist:  None   Referring MD: Unk Pinto, MD   No chief complaint on file.   History of Present Illness:    Jillian Rodgers is a 75 y.o. female with a hx of atrial fibrillation, s/p cardioversion 10/25/2020 in ER [facility]. CHADS VASC 4 and still on Eliquis. Requested to establish with a cardiologist.   ***  Past Medical History:  Diagnosis Date   Anemia with low platelet count (HCC)    Degenerative joint disease    GERD (gastroesophageal reflux disease)    History of kidney stones    Hyperlipidemia    Hypertension    off of b/p meds due to blood pressure fluctuating - discontinued by DR Melford Aase    Hypothyroidism    Inguinal hernia 02/19/2019   Migraines    neurontin helps   Pneumonia    hx of walking pneumonia     Past Surgical History:  Procedure Laterality Date   BILATERAL CATARACT SURGERY      BLERPHOROPLASTY \     CHOLECYSTECTOMY  1996   open   COLONOSCOPY  2009   recommended 10 year f/u   [place]   umb hernia rpr   HERNIA REPAIR  2005 & 2009   ventral hernia rpr   INGUINAL HERNIA REPAIR Left 04/30/2020   Procedure: OPEN LEFT INGUINAL HERNIA REPAIR WITH MESH;  Surgeon: Clovis Riley, MD;  Location: [facility];  Service: General;  Laterality: Left;   PILONIDAL CYST EXCISION  1972   SKIN CANCER REMOVAL      TONSILLECTOMY     UPPER GI ENDOSCOPY     VENTRAL HERNIA REPAIR      Current Medications: No outpatient medications have been marked as taking for the 03/14/21 encounter (Appointment) with Belva Crome, MD.     Allergies:   Patient has no known allergies.   Social History   Socioeconomic History   Marital status: Married    Spouse name: Not on file   Number of children: 2   Years of education: 18   Highest education level: Not on file  Occupational History   Not on file   Tobacco Use   Smoking status: Never   Smokeless tobacco: Never  Vaping Use   Vaping Use: Never used  Substance and Sexual Activity   Alcohol use: No   Drug use: No   Sexual activity: Yes  Other Topics Concern   Not on file  Social History Narrative   Not on file   Social Determinants of Health   Financial Resource Strain: Not on file  Food Insecurity: Not on file  Transportation Needs: Not on file  Physical Activity: Not on file  Stress: Not on file  Social Connections: Not on file     Family History: The patient's family history includes Breast cancer in her sister; Cancer in her mother; Congestive Heart Failure in her father; Gout in her maternal aunt; Heart disease in her mother; Hypertension in her mother; Ovarian cancer in her mother; Stroke in her paternal uncle; Uterine cancer in her mother. There is no history of Colon cancer, Esophageal cancer, Stomach cancer, or Rectal cancer.  ROS:   Please see the history of present illness.    *** All other systems reviewed and are negative.  EKGs/Labs/Other Studies Reviewed:    The following  studies were reviewed today:  ECHOCARDIOGRAM 11/2020: IMPRESSIONS     1. Left ventricular ejection fraction, by estimation, is 60 to 65%. The  left ventricle has normal function. The left ventricle has no regional  wall motion abnormalities. Left ventricular diastolic parameters are  indeterminate.   2. Right ventricular systolic function is normal. The right ventricular  size is normal. There is normal pulmonary artery systolic pressure.   3. The mitral valve is normal in structure. Mild mitral valve  regurgitation.   4. The aortic valve is tricuspid. Aortic valve regurgitation is not  visualized. Mild aortic valve sclerosis is present, with no evidence of  aortic valve stenosis.    5. LA size is normal.   CONTINUOUS MONITOR 2022: Study Highlights  Underlying rhythm is NSR No instances of atrial fibrillation.    EKG:   EKG ***  Recent Labs: 01/31/2021: TSH 1.426 02/01/2021: Magnesium 2.0 03/08/2021: ALT 13; BUN 21; Creatinine 1.18; Hemoglobin 12.7; Platelet Count 63; Potassium 4.3; Sodium 140  Recent Lipid Panel    Component Value Date/Time   CHOL 140 02/01/2021 1444   TRIG 135 02/01/2021 1444   HDL 58 02/01/2021 1444   CHOLHDL 2.4 02/01/2021 1444   VLDL 34 (H) 09/26/2016 1147   LDLCALC 60 02/01/2021 1444    Physical Exam:    VS:  There were no vitals taken for this visit.    Wt Readings from Last 3 Encounters:  03/08/21 153 lb 3.2 oz (69.5 kg)  03/04/21 150 lb (68 kg)  02/01/21 161 lb 6.4 oz (73.2 kg)     GEN: ***. No acute distress HEENT: Normal NECK: No JVD. LYMPHATICS: No lymphadenopathy CARDIAC: *** murmur. RRR *** gallop, or edema. VASCULAR: *** Normal Pulses. No bruits. RESPIRATORY:  Clear to auscultation without rales, wheezing or rhonchi  ABDOMEN: Soft, non-tender, non-distended, No pulsatile mass, MUSCULOSKELETAL: No deformity  SKIN: Warm and dry NEUROLOGIC:  Alert and oriented x 3 PSYCHIATRIC:  Normal affect   ASSESSMENT:    1. Paroxysmal atrial fibrillation (HCC)   2. Thrombocytopenia ([place])   3. Secondary hypercoagulable state ([place])   4. Primary hypertension   5. Mixed hyperlipidemia   6. Stage 3b chronic kidney disease ([place])    PLAN:    In order of problems listed above:  Consider DC anticoagulant and if recurrent AF, Watchman consult. Platelet counts run < 75 K.***   Medication Adjustments/Labs and Tests Ordered: Current medicines are reviewed at length with the patient today.  Concerns regarding medicines are outlined above.  No orders of the defined types were placed in this encounter.  No orders of the defined types were placed in this encounter.   There are no Patient Instructions on file for this visit.   Signed, Sinclair Grooms, MD  03/13/2021 4:55 PM    [facility]

## 2021-03-14 ENCOUNTER — Telehealth: Payer: Self-pay

## 2021-03-14 ENCOUNTER — Ambulatory Visit: Payer: Medicare PPO | Admitting: Interventional Cardiology

## 2021-03-14 NOTE — Telephone Encounter (Signed)
-----   Message from Lincoln Brigham, PA-C sent at 03/14/2021 12:34 PM EST ----- Please let patient know that remaining lab results from our visit last week was unremarkable. As discussed during the visit, we will continue to monitor her platelet counts closely.

## 2021-03-14 NOTE — Telephone Encounter (Signed)
Left message for pt with lab results

## 2021-03-14 NOTE — Procedures (Signed)
   Patient Name: Jillian, Rodgers Date: 03/04/2021 Gender: Female D.O.B: 05-Jul-1945 Age (years): 75 Referring Provider: Daneen Schick Height (inches): 41 Interpreting Physician: Fransico Him MD, ABSM Weight (lbs): 150 RPSGT: Jorge Ny BMI: 27 MRN: [id number] Neck Size: 15.75  CLINICAL INFORMATION Sleep Study Type: NPSG  Indication for sleep study: Hypertension, Obesity, Snoring  Epworth Sleepiness Score: 5  SLEEP STUDY TECHNIQUE As per the AASM Manual for the Scoring of Sleep and Associated Events v2.3 (April 2016) with a hypopnea requiring 4% desaturations.  The channels recorded and monitored were frontal, central and occipital EEG, electrooculogram (EOG), submentalis EMG (chin), nasal and oral airflow, thoracic and abdominal wall motion, anterior tibialis EMG, snore microphone, electrocardiogram, and pulse oximetry.  MEDICATIONS Medications self-administered by patient taken the night of the study : GABAPENTIN, PRAVACHOL, Eliquis, ZYRTEC  SLEEP ARCHITECTURE The study was initiated at 10:46:51 PM and ended at 5:33:35 AM.  Sleep onset time was 6.0 minutes and the sleep efficiency was 65.2%. The total sleep time was 265 minutes.  Stage REM latency was 230.0 minutes.  The patient spent 8.9% of the night in stage N1 sleep, 81.5% in stage N2 sleep, 0.0% in stage N3 and 9.6% in REM.  Alpha intrusion was absent.  Supine sleep was 100.00%.  RESPIRATORY PARAMETERS The overall apnea/hypopnea index (AHI) was 15.6 per hour. There were 2 total apneas, including 1 obstructive, 1 central and 0 mixed apneas. There were 67 hypopneas and 31 RERAs.  The AHI during Stage REM sleep was 21.2 per hour.  AHI while supine was 15.6 per hour.  The mean oxygen saturation was 91.9%. The minimum SpO2 during sleep was 82.0%.  moderate snoring was noted during this study.  CARDIAC DATA The 2 lead EKG demonstrated sinus rhythm. The mean heart rate was 59.3 beats per minute. Other  EKG findings include: None.  LEG MOVEMENT DATA The total PLMS were 0 with a resulting PLMS index of 0.0. Associated arousal with leg movement index was 9.5 .  IMPRESSIONS - Moderate obstructive sleep apnea occurred during this study (AHI = 15.6/h). - No significant central sleep apnea occurred during this study (CAI = 0.2/h). - Mild oxygen desaturation was noted during this study (Min O2 = 82.0%). - The patient snored with moderate snoring volume. - No cardiac abnormalities were noted during this study. - Clinically significant periodic limb movements did not occur during sleep.   DIAGNOSIS - Obstructive Sleep Apnea (G47.33) - Nocturnal Hypoxemia (G47.36)  RECOMMENDATIONS - Therapeutic CPAP titration to determine optimal pressure required to alleviate sleep disordered breathing. - Positional therapy avoiding supine position during sleep. - Avoid alcohol, sedatives and other CNS depressants that may worsen sleep apnea and disrupt normal sleep architecture. - Sleep hygiene should be reviewed to assess factors that may improve sleep quality. - Weight management and regular exercise should be initiated or continued if appropriate.  [Electronically signed] 03/14/2021 02:05 PM  Fransico Him MD, ABSM Diplomate, American Board of Sleep Medicine

## 2021-03-19 ENCOUNTER — Telehealth: Payer: Self-pay | Admitting: *Deleted

## 2021-03-19 DIAGNOSIS — G4733 Obstructive sleep apnea (adult) (pediatric): Secondary | ICD-10-CM

## 2021-03-19 NOTE — Telephone Encounter (Addendum)
The patient has been notified of the result and verbalized understanding.  All questions (if any) were answered. Jillian Rodgers, [place] 03/19/2021 6:31 PM    NOT APPROVED in [place]. pending

## 2021-03-19 NOTE — Telephone Encounter (Signed)
-----   Message from Sueanne Margarita, MD sent at 03/14/2021  2:07 PM EST ----- Please let patient know that they have sleep apnea.  Recommend therapeutic CPAP titration for treatment of patient's sleep disordered breathing.  If unable to perform an in lab titration then initiate ResMed auto CPAP from 4 to 15cm H2O with heated humidity and mask of choice and overnight pulse ox on CPAP.

## 2021-03-20 NOTE — Progress Notes (Signed)
Cardiology Office Note:    Date:  03/21/2021   ID:  Kinsey, Karch [birth year], MRN [id number]  PCP:  Unk Pinto, MD  Cardiologist:  Sinclair Grooms, MD   Referring MD: Unk Pinto, MD   Chief Complaint  Patient presents with   Atrial Fibrillation    History of Present Illness:    Jillian Rodgers is a 75 y.o. female with a hx of  atrial fibrillation, s/p cardioversion 10/25/2020 in ER [facility]. CHADS VASC 4 and still on Eliquis. Requested to establish with a cardiologist.  Recent diagnosis of sleep apnea..  C-Pap was recommended.  She is not sure that she will comply with that.  Monitor did not demonstrate any recurrent episodes of atrial fibrillation.  She had prominence in atrial fibrillation occurred.  It would not be difficult for her to recognize if they were to recur.  Past Medical History:  Diagnosis Date   Anemia with low platelet count (HCC)    Degenerative joint disease    GERD (gastroesophageal reflux disease)    History of kidney stones    Hyperlipidemia    Hypertension    off of b/p meds due to blood pressure fluctuating - discontinued by DR Melford Aase    Hypothyroidism    Inguinal hernia 02/19/2019   Migraines    neurontin helps   Pneumonia    hx of walking pneumonia     Past Surgical History:  Procedure Laterality Date   BILATERAL CATARACT SURGERY      BLERPHOROPLASTY \     CHOLECYSTECTOMY  1996   open   COLONOSCOPY  2009   recommended 10 year f/u   HERNIA REPAIR  1985   umb hernia rpr   HERNIA REPAIR  2005 & 2009   ventral hernia rpr   INGUINAL HERNIA REPAIR Left 04/30/2020   Procedure: OPEN LEFT INGUINAL HERNIA REPAIR WITH MESH;  Surgeon: Clovis Riley, MD;  Location: [facility];  Service: General;  Laterality: Left;   PILONIDAL CYST EXCISION  1972   SKIN CANCER REMOVAL      TONSILLECTOMY     UPPER GI ENDOSCOPY     VENTRAL HERNIA REPAIR      Current Medications: Current Meds  Medication Sig   Ascorbic Acid  (VITAMIN C) 500 MG CAPS Take 500 mg by mouth daily.    BOTOX 100 units SOLR injection    cetirizine (ZYRTEC) 10 MG tablet Take 10 mg by mouth daily.   Cholecalciferol (VITAMIN D PO) Take 5,000 Units by mouth daily.   gabapentin (NEURONTIN) 600 MG tablet Take 900-1,200 mg by mouth See admin instructions. Take 900 mg by mouth in morning and 1200 mg at bedtime   levothyroxine (SYNTHROID) 50 MCG tablet Take  1 tablet  Daily  on an empty stomach with only water for 30 minutes & no Antacid meds, Calcium or Magnesium for 4 hours & avoid Biotin   pantoprazole (PROTONIX) 40 MG tablet Take 1 tablet Daily for Acid Indigestion & Heartburn   pravastatin (PRAVACHOL) 40 MG tablet TAKE 1 TABLET BY MOUTH AT BEDTIME FOR CHOLESTEROL   zinc gluconate 50 MG tablet Take 50 mg by mouth daily.     Allergies:   Patient has no known allergies.   Social History   Socioeconomic History   Marital status: Married    Spouse name: Not on file   Number of children: 2   Years of education: 29   Highest education level: Not on file  Occupational History  Not on file  Tobacco Use   Smoking status: Never   Smokeless tobacco: Never  Vaping Use   Vaping Use: Never used  Substance and Sexual Activity   Alcohol use: No   Drug use: No   Sexual activity: Yes  Other Topics Concern   Not on file  Social History Narrative   Not on file   Social Determinants of Health   Financial Resource Strain: Not on file  Food Insecurity: Not on file  Transportation Needs: Not on file  Physical Activity: Not on file  Stress: Not on file  Social Connections: Not on file     Family History: The patient's family history includes Breast cancer in her sister; Cancer in her mother; Congestive Heart Failure in her father; Gout in her maternal aunt; Heart disease in her mother; Hypertension in her mother; Ovarian cancer in her mother; Stroke in her paternal uncle; Uterine cancer in her mother. There is no history of Colon cancer,  Esophageal cancer, Stomach cancer, or Rectal cancer.  ROS:   Please see the history of present illness.    No bleeding on Eliquis despite low platelet count all other systems reviewed and are negative.  EKGs/Labs/Other Studies Reviewed:    The following studies were reviewed today: Prolonged monitor 02/02/2021: Study Highlights  Underlying rhythm is NSR No instances of atrial fibrillation.  ECHOCARDIOGRAPHY 12/14/2020 IMPRESSIONS     1. Left ventricular ejection fraction, by estimation, is 60 to 65%. The  left ventricle has normal function. The left ventricle has no regional  wall motion abnormalities. Left ventricular diastolic parameters are  indeterminate.   2. Right ventricular systolic function is normal. The right ventricular  size is normal. There is normal pulmonary artery systolic pressure.   3. The mitral valve is normal in structure. Mild mitral valve  regurgitation.   4. The aortic valve is tricuspid. Aortic valve regurgitation is not  visualized. Mild aortic valve sclerosis is present, with no evidence of  aortic valve stenosis.    EKG:  EKG not repeated today  Recent Labs: 01/31/2021: TSH 1.426 02/01/2021: Magnesium 2.0 03/08/2021: ALT 13; BUN 21; Creatinine 1.18; Hemoglobin 12.7; Platelet Count 63; Potassium 4.3; Sodium 140  Recent Lipid Panel    Component Value Date/Time   CHOL 140 02/01/2021 1444   TRIG 135 02/01/2021 1444   HDL 58 02/01/2021 1444   CHOLHDL 2.4 02/01/2021 1444   VLDL 34 (H) 09/26/2016 1147   LDLCALC 60 02/01/2021 1444    Physical Exam:    VS:  BP 112/80   Pulse 72   Ht 5\' 3"  (1.6 m)   Wt 155 lb 3.2 oz (70.4 kg)   SpO2 96%   BMI 27.49 kg/m     Wt Readings from Last 3 Encounters:  03/21/21 155 lb 3.2 oz (70.4 kg)  03/08/21 153 lb 3.2 oz (69.5 kg)  03/04/21 150 lb (68 kg)     GEN: Healthy appearing. No acute distress HEENT: Normal NECK: No JVD. LYMPHATICS: No lymphadenopathy CARDIAC: No murmur. RRR no gallop, or  edema. VASCULAR:  Normal Pulses. No bruits. RESPIRATORY:  Clear to auscultation without rales, wheezing or rhonchi  ABDOMEN: Soft, non-tender, non-distended, No pulsatile mass, MUSCULOSKELETAL: No deformity  SKIN: Warm and dry NEUROLOGIC:  Alert and oriented x 3 PSYCHIATRIC:  Normal affect   ASSESSMENT:    1. Paroxysmal atrial fibrillation (HCC)   2. Primary hypertension   3. Secondary hypercoagulable state ([place])   4. Mixed hyperlipidemia   5. OSA (obstructive  sleep apnea)   6. Stage 3b chronic kidney disease ([place])   7. Thrombocytopenia ([place])    PLAN:    In order of problems listed above:  Unable to document atrial fibrillation on long-term monitoring.  She is at high risk for stroke if high burden atrial fibrillation occurs at some point in the future.  She is at higher than usual risk for bleeding given chronic thrombocytopenia.  Options include discontinuation of anticoagulation due to current low burden atrial fibrillation.  Watchman device.  Suppressive antiarrhythmic therapy.  Clinical follow-up with further management depending upon future A. fib burden. Under good control based upon today's evaluation Discontinue apixaban.  We have made a shared decision since she does have symptoms when A. fib is present.  She has significant risk of bleeding with low platelet count. Did not discuss lipid management We did discuss the presence of sleep apnea that has been recommended it be treated with CPAP.  She does not believe that she will be able to tolerate CPAP.  I did did educate her about the association between sleep apnea and incidence of atrial fibrillation episodes. Did not discuss Apixaban is being discontinued as she has very low volume atrial fibrillation, is symptomatic when the isolated episode occurs that she has, and we have identified a possible precipitant which is obstructive sleep apnea which she will be trying to get managed.   Prolonged discussion today concerning  risk of anticoagulation in this patient who had no atrial fibrillation on the 30-day monitor.  By shared decision making, we have decided to discontinue apixaban and follow clinically.  She will return in 1 year if not sooner.  This was an extended office visit lasting greater than 40 minutes with discussions concerning management of atrial fibrillation, attendant risk of anticoagulation therapy, and the importance of management of sleep apnea.  Medication Adjustments/Labs and Tests Ordered: Current medicines are reviewed at length with the patient today.  Concerns regarding medicines are outlined above.  No orders of the defined types were placed in this encounter.  No orders of the defined types were placed in this encounter.   Patient Instructions  Medication Instructions:  1) DISCONTINUE Eliquis  *If you need a refill on your cardiac medications before your next appointment, please call your pharmacy*   Lab Work: None If you have labs (blood work) drawn today and your tests are completely normal, you will receive your results only by: [place] (if you have MyChart) OR A paper copy in the mail If you have any lab test that is abnormal or we need to change your treatment, we will call you to review the results.   Testing/Procedures: None   Follow-Up: At [facility], you and your health needs are our priority.  As part of our continuing mission to provide you with exceptional heart care, we have created designated Provider Care Teams.  These Care Teams include your primary Cardiologist (physician) and Advanced Practice Providers (APPs -  Physician Assistants and Nurse Practitioners) who all work together to provide you with the care you need, when you need it.  We recommend signing up for the patient portal called "MyChart".  Sign up information is provided on this After Visit Summary.  MyChart is used to connect with patients for Virtual Visits (Telemedicine).  Patients  are able to view lab/test results, encounter notes, upcoming appointments, etc.  Non-urgent messages can be sent to your provider as well.   To learn more about what you can do  with MyChart, go to [email or web address].    Your next appointment:   1 year(s)  The format for your next appointment:   In Person  Provider:   Sinclair Grooms, MD     Other Instructions     Signed, Sinclair Grooms, MD  03/21/2021 2:31 PM    [place]

## 2021-03-21 ENCOUNTER — Encounter: Payer: Self-pay | Admitting: Interventional Cardiology

## 2021-03-21 ENCOUNTER — Other Ambulatory Visit: Payer: Self-pay

## 2021-03-21 ENCOUNTER — Ambulatory Visit: Payer: Medicare PPO | Admitting: Interventional Cardiology

## 2021-03-21 VITALS — BP 112/80 | HR 72 | Ht 63.0 in | Wt 155.2 lb

## 2021-03-21 DIAGNOSIS — E782 Mixed hyperlipidemia: Secondary | ICD-10-CM | POA: Diagnosis not present

## 2021-03-21 DIAGNOSIS — I10 Essential (primary) hypertension: Secondary | ICD-10-CM

## 2021-03-21 DIAGNOSIS — G4733 Obstructive sleep apnea (adult) (pediatric): Secondary | ICD-10-CM

## 2021-03-21 DIAGNOSIS — I480 Paroxysmal atrial fibrillation: Secondary | ICD-10-CM | POA: Diagnosis not present

## 2021-03-21 DIAGNOSIS — D696 Thrombocytopenia, unspecified: Secondary | ICD-10-CM | POA: Diagnosis not present

## 2021-03-21 DIAGNOSIS — N1832 Chronic kidney disease, stage 3b: Secondary | ICD-10-CM | POA: Diagnosis not present

## 2021-03-21 DIAGNOSIS — D6869 Other thrombophilia: Secondary | ICD-10-CM

## 2021-03-21 DIAGNOSIS — N63 Unspecified lump in unspecified breast: Secondary | ICD-10-CM | POA: Diagnosis not present

## 2021-03-21 NOTE — Patient Instructions (Addendum)
Medication Instructions:  1) DISCONTINUE Eliquis  *If you need a refill on your cardiac medications before your next appointment, please call your pharmacy*   Lab Work: None If you have labs (blood work) drawn today and your tests are completely normal, you will receive your results only by: [place] (if you have MyChart) OR A paper copy in the mail If you have any lab test that is abnormal or we need to change your treatment, we will call you to review the results.   Testing/Procedures: None   Follow-Up: At [facility], you and your health needs are our priority.  As part of our continuing mission to provide you with exceptional heart care, we have created designated Provider Care Teams.  These Care Teams include your primary Cardiologist (physician) and Advanced Practice Providers (APPs -  Physician Assistants and Nurse Practitioners) who all work together to provide you with the care you need, when you need it.  We recommend signing up for the patient portal called "MyChart".  Sign up information is provided on this After Visit Summary.  MyChart is used to connect with patients for Virtual Visits (Telemedicine).  Patients are able to view lab/test results, encounter notes, upcoming appointments, etc.  Non-urgent messages can be sent to your provider as well.   To learn more about what you can do with MyChart, go to [email or web address].    Your next appointment:   1 year(s)  The format for your next appointment:   In Person  Provider:   Sinclair Grooms, MD     Other Instructions

## 2021-03-22 ENCOUNTER — Other Ambulatory Visit: Payer: Self-pay | Admitting: Obstetrics and Gynecology

## 2021-03-22 DIAGNOSIS — N6310 Unspecified lump in the right breast, unspecified quadrant: Secondary | ICD-10-CM

## 2021-03-23 ENCOUNTER — Ambulatory Visit: Payer: Medicare PPO | Admitting: Interventional Cardiology

## 2021-03-30 DIAGNOSIS — G43019 Migraine without aura, intractable, without status migrainosus: Secondary | ICD-10-CM | POA: Diagnosis not present

## 2021-03-30 DIAGNOSIS — G43111 Migraine with aura, intractable, with status migrainosus: Secondary | ICD-10-CM | POA: Diagnosis not present

## 2021-03-30 DIAGNOSIS — M791 Myalgia, unspecified site: Secondary | ICD-10-CM | POA: Diagnosis not present

## 2021-03-30 DIAGNOSIS — G518 Other disorders of facial nerve: Secondary | ICD-10-CM | POA: Diagnosis not present

## 2021-03-30 DIAGNOSIS — M542 Cervicalgia: Secondary | ICD-10-CM | POA: Diagnosis not present

## 2021-03-30 DIAGNOSIS — G43719 Chronic migraine without aura, intractable, without status migrainosus: Secondary | ICD-10-CM | POA: Diagnosis not present

## 2021-04-13 NOTE — Telephone Encounter (Signed)
Prior Authorization for titration sent to [facility] via web portal.  APPROVED: Josem Kaufmann [id number]. VALID DATES:04/23/21- 05/23/21.

## 2021-04-14 NOTE — Telephone Encounter (Signed)
Patient is scheduled for CPAP Titration on 05-17-20. Patient understands her titration study will be done at [facility] sleep lab. Patient understands she will receive a letter in a week or so detailing appointment, date, time, and location.

## 2021-05-02 ENCOUNTER — Other Ambulatory Visit: Payer: Self-pay

## 2021-05-02 ENCOUNTER — Ambulatory Visit (HOSPITAL_COMMUNITY)
Admission: RE | Admit: 2021-05-02 | Discharge: 2021-05-02 | Disposition: A | Discharge status: Home / Self Care | Payer: Medicare PPO | Source: Ambulatory Visit | Attending: Physician Assistant | Admitting: Physician Assistant

## 2021-05-02 VITALS — BP 108/72 | HR 68 | Ht 63.0 in | Wt 146.2 lb

## 2021-05-02 DIAGNOSIS — R42 Dizziness and giddiness: Secondary | ICD-10-CM | POA: Diagnosis not present

## 2021-05-02 DIAGNOSIS — D6869 Other thrombophilia: Secondary | ICD-10-CM | POA: Diagnosis not present

## 2021-05-02 DIAGNOSIS — D649 Anemia, unspecified: Secondary | ICD-10-CM | POA: Insufficient documentation

## 2021-05-02 DIAGNOSIS — E039 Hypothyroidism, unspecified: Secondary | ICD-10-CM | POA: Diagnosis not present

## 2021-05-02 DIAGNOSIS — E785 Hyperlipidemia, unspecified: Secondary | ICD-10-CM | POA: Insufficient documentation

## 2021-05-02 DIAGNOSIS — N189 Chronic kidney disease, unspecified: Secondary | ICD-10-CM | POA: Diagnosis not present

## 2021-05-02 DIAGNOSIS — I480 Paroxysmal atrial fibrillation: Secondary | ICD-10-CM | POA: Insufficient documentation

## 2021-05-02 DIAGNOSIS — G4733 Obstructive sleep apnea (adult) (pediatric): Secondary | ICD-10-CM | POA: Insufficient documentation

## 2021-05-02 DIAGNOSIS — I129 Hypertensive chronic kidney disease with stage 1 through stage 4 chronic kidney disease, or unspecified chronic kidney disease: Secondary | ICD-10-CM | POA: Insufficient documentation

## 2021-05-02 NOTE — Progress Notes (Signed)
Primary Care Physician: Unk Pinto, MD Primary Cardiologist: Dr Tamala Julian Primary Electrophysiologist: none Referring Physician: [facility]   Jillian Rodgers is a 76 y.o. female with a history of anemia, HLD, HTN, OSA, hypothyroidism, CKD, atrial fibrillation who presents for follow up in the [facility]. The patient was initially diagnosed with atrial fibrillation 10/25/20 after presenting to ED with chest tightness and dizziness. She underwent successful DCCV and was started on Eliquis for a CHADS2VASC score of 4. There were no specific triggers that she could identify.   On follow up today, patient reports she has done well from a cardiac standpoint. Patient wore a cardiac monitor 02/2021 which showed no atrial fibrillation. She is not on anticoagulation due to thrombocytopenia. She does have intermittent dizziness which has been chronic for years.   Today, she denies symptoms of palpitations, chest pain, shortness of breath, orthopnea, PND, lower extremity edema, presyncope, syncope, bleeding, or neurologic sequela. The patient is tolerating medications without difficulties and is otherwise without complaint today.    Atrial Fibrillation Risk Factors:  she does not have symptoms or diagnosis of sleep apnea. she does not have a history of rheumatic fever. she does not have a history of alcohol use. The patient does not have a history of early familial atrial fibrillation or other arrhythmias.  she has a BMI of Body mass index is 25.9 kg/m.[name] Kitchen Filed Weights   05/02/21 1424  Weight: 66.3 kg     Family History  Problem Relation Age of Onset   Ovarian cancer Mother    Heart disease Mother    Cancer Mother    Uterine cancer Mother    Hypertension Mother    Breast cancer Sister    Stroke Paternal Uncle    Congestive Heart Failure Father    Gout Maternal Aunt    Colon cancer Neg Hx    Esophageal cancer Neg Hx    Stomach cancer Neg Hx     Rectal cancer Neg Hx      Atrial Fibrillation Management history:  Previous antiarrhythmic drugs: none Previous cardioversions: 10/25/20 Previous ablations: none CHADS2VASC score: 4 Anticoagulation history: Eliquis   Past Medical History:  Diagnosis Date   Anemia with low platelet count (HCC)    Degenerative joint disease    GERD (gastroesophageal reflux disease)    History of kidney stones    Hyperlipidemia    Hypertension    off of b/p meds due to blood pressure fluctuating - discontinued by DR Melford Aase    Hypothyroidism    Inguinal hernia 02/19/2019   Migraines    neurontin helps   Pneumonia    hx of walking pneumonia    Past Surgical History:  Procedure Laterality Date   BILATERAL CATARACT SURGERY      BLERPHOROPLASTY \     CHOLECYSTECTOMY  1996   open   COLONOSCOPY  2009   recommended 10 year f/u   HERNIA REPAIR  1985   umb hernia rpr   HERNIA REPAIR  2005 & 2009   ventral hernia rpr   INGUINAL HERNIA REPAIR Left 04/30/2020   Procedure: OPEN LEFT INGUINAL HERNIA REPAIR WITH MESH;  Surgeon: Clovis Riley, MD;  Location: [facility];  Service: General;  Laterality: Left;   PILONIDAL CYST EXCISION  1972   SKIN CANCER REMOVAL      TONSILLECTOMY     UPPER GI ENDOSCOPY     VENTRAL HERNIA REPAIR      Current Outpatient Medications  Medication Sig Dispense Refill   Ascorbic Acid (VITAMIN C) 500 MG CAPS Take 500 mg by mouth daily.      BOTOX 100 units SOLR injection For migraines     cetirizine (ZYRTEC) 10 MG tablet Take 10 mg by mouth daily.     Cholecalciferol (VITAMIN D PO) Take 5,000 Units by mouth daily.     gabapentin (NEURONTIN) 600 MG tablet Take 900-1,200 mg by mouth See admin instructions. Take 900 mg by mouth in morning and 1200 mg at bedtime     levothyroxine (SYNTHROID) 50 MCG tablet Take  1 tablet  Daily  on an empty stomach with only water for 30 minutes & no Antacid meds, Calcium or Magnesium for 4 hours & avoid Biotin 90 tablet 3   pantoprazole  (PROTONIX) 40 MG tablet Take 1 tablet Daily for Acid Indigestion & Heartburn 90 tablet 0   pravastatin (PRAVACHOL) 40 MG tablet TAKE 1 TABLET BY MOUTH AT BEDTIME FOR CHOLESTEROL 90 tablet 3   zinc gluconate 50 MG tablet Take 50 mg by mouth daily.     No current facility-administered medications for this encounter.    No Known Allergies  Social History   Socioeconomic History   Marital status: Married    Spouse name: Not on file   Number of children: 2   Years of education: 18   Highest education level: Not on file  Occupational History   Not on file  Tobacco Use   Smoking status: Never   Smokeless tobacco: Never  Vaping Use   Vaping Use: Never used  Substance and Sexual Activity   Alcohol use: No   Drug use: No   Sexual activity: Yes  Other Topics Concern   Not on file  Social History Narrative   Not on file   Social Determinants of Health   Financial Resource Strain: Not on file  Food Insecurity: Not on file  Transportation Needs: Not on file  Physical Activity: Not on file  Stress: Not on file  Social Connections: Not on file  Intimate Partner Violence: Not on file     ROS- All systems are reviewed and negative except as per the HPI above.  Physical Exam: Vitals:   05/02/21 1424  BP: 108/72  Pulse: 68  Weight: 66.3 kg  Height: 5\' 3"  (1.6 m)    GEN- The patient is a well appearing elderly female, alert and oriented x 3 today.   HEENT-head normocephalic, atraumatic, sclera clear, conjunctiva pink, hearing intact, trachea midline. Lungs- Clear to ausculation bilaterally, normal work of breathing Heart- Regular rate and rhythm, no murmurs, rubs or gallops  GI- soft, NT, ND, + BS Extremities- no clubbing, cyanosis, or edema MS- no significant deformity or atrophy Skin- no rash or lesion Psych- euthymic mood, full affect Neuro- strength and sensation are intact   Wt Readings from Last 3 Encounters:  05/02/21 66.3 kg  03/21/21 70.4 kg  03/08/21 69.5  kg    EKG today demonstrates  SR, NST Vent. rate 68 BPM PR interval 146 ms QRS duration 78 ms QT/QTcB 388/412 ms  Echo 12/14/20 demonstrated  1. Left ventricular ejection fraction, by estimation, is 60 to 65%. The  left ventricle has normal function. The left ventricle has no regional  wall motion abnormalities. Left ventricular diastolic parameters are  indeterminate.   2. Right ventricular systolic function is normal. The right ventricular  size is normal. There is normal pulmonary artery systolic pressure.   3. The mitral valve is normal  in structure. Mild mitral valve  regurgitation.   4. The aortic valve is tricuspid. Aortic valve regurgitation is not  visualized. Mild aortic valve sclerosis is present, with no evidence of  aortic valve stenosis.   Epic records are reviewed at length today  CHA2DS2-VASc Score = 4  The patient's score is based upon: CHF History: 0 HTN History: 1 Diabetes History: 0 Stroke History: 0 Vascular Disease History: 0 Age Score: 2 Gender Score: 1       ASSESSMENT AND PLAN: 1. Paroxysmal Atrial Fibrillation (ICD10:  I48.0) The patient's CHA2DS2-VASc score is 4, indicating a 4.8% annual risk of stroke.   Event monitor 02/2021 did not show any atrial fibrillation. Her Eliquis was discontinued after shared decision making regarding her elevated bleeding risk.  2. Secondary Hypercoagulable State (ICD10:  D68.69) The patient is at significant risk for stroke/thromboembolism based upon her CHA2DS2-VASc Score of 4.  However, the patient is not on anticoagulation due to her high bleeding risk with low platelet count.  3. HTN By history but appears to be well controlled off medication. No changes today.  4. OSA The importance of adequate treatment of sleep apnea was discussed today in order to improve our ability to maintain sinus rhythm long term. Patient scheduled for CPAP titration. Patient unsure if she will follow through.    Follow up  with Dr Tamala Julian per recall.    [facility] [address] [place], [place] [address] [phone] 05/02/2021 2:46 PM

## 2021-05-03 ENCOUNTER — Ambulatory Visit
Admission: RE | Admit: 2021-05-03 | Discharge: 2021-05-03 | Disposition: A | Discharge status: Home / Self Care | Payer: Medicare PPO | Source: Ambulatory Visit | Attending: Obstetrics and Gynecology | Admitting: Obstetrics and Gynecology

## 2021-05-03 ENCOUNTER — Ambulatory Visit: Payer: Medicare PPO

## 2021-05-03 DIAGNOSIS — R922 Inconclusive mammogram: Secondary | ICD-10-CM | POA: Diagnosis not present

## 2021-05-03 DIAGNOSIS — N6489 Other specified disorders of breast: Secondary | ICD-10-CM | POA: Diagnosis not present

## 2021-05-03 DIAGNOSIS — N6310 Unspecified lump in the right breast, unspecified quadrant: Secondary | ICD-10-CM

## 2021-05-04 ENCOUNTER — Telehealth: Payer: Self-pay | Admitting: Hematology and Oncology

## 2021-05-04 NOTE — Telephone Encounter (Signed)
Sch per 1/8 inbasket, pt aware

## 2021-05-17 ENCOUNTER — Encounter (HOSPITAL_BASED_OUTPATIENT_CLINIC_OR_DEPARTMENT_OTHER): Payer: Medicare PPO | Admitting: Cardiology

## 2021-05-17 DIAGNOSIS — G518 Other disorders of facial nerve: Secondary | ICD-10-CM | POA: Diagnosis not present

## 2021-05-17 DIAGNOSIS — G43019 Migraine without aura, intractable, without status migrainosus: Secondary | ICD-10-CM | POA: Diagnosis not present

## 2021-05-17 DIAGNOSIS — G43719 Chronic migraine without aura, intractable, without status migrainosus: Secondary | ICD-10-CM | POA: Diagnosis not present

## 2021-05-17 DIAGNOSIS — M542 Cervicalgia: Secondary | ICD-10-CM | POA: Diagnosis not present

## 2021-05-17 DIAGNOSIS — M791 Myalgia, unspecified site: Secondary | ICD-10-CM | POA: Diagnosis not present

## 2021-05-17 DIAGNOSIS — G43111 Migraine with aura, intractable, with status migrainosus: Secondary | ICD-10-CM | POA: Diagnosis not present

## 2021-05-21 ENCOUNTER — Other Ambulatory Visit: Payer: Self-pay | Admitting: Adult Health

## 2021-06-16 ENCOUNTER — Other Ambulatory Visit: Payer: Medicare PPO

## 2021-06-16 ENCOUNTER — Ambulatory Visit: Payer: Medicare PPO | Admitting: Hematology and Oncology

## 2021-06-20 NOTE — Telephone Encounter (Signed)
(  PROCEDURE IS NOT AUTHORIZED - CASE REQUIRES CLINICAL REVIEW.  ?

## 2021-06-23 NOTE — Telephone Encounter (Signed)
Prior Authorization for titration sent to [facility] via web portal.  APPROVED: Josem Kaufmann [id number]. VALID DATES:07/18/21---08/17/21. ?

## 2021-06-28 NOTE — Telephone Encounter (Signed)
Patient is scheduled for CPAP Titration on 07/28/21. ?Patient understands her titration study will be done at [facility] sleep lab. ?Patient understands she will receive a letter in a week or so detailing appointment, date, time, and location. ?Patient understands to call if she does not receive the letter  in a timely manner. ?Patient agrees with treatment and thanked me for call.  ?

## 2021-06-29 DIAGNOSIS — G43111 Migraine with aura, intractable, with status migrainosus: Secondary | ICD-10-CM | POA: Diagnosis not present

## 2021-06-29 DIAGNOSIS — M542 Cervicalgia: Secondary | ICD-10-CM | POA: Diagnosis not present

## 2021-06-29 DIAGNOSIS — M791 Myalgia, unspecified site: Secondary | ICD-10-CM | POA: Diagnosis not present

## 2021-06-29 DIAGNOSIS — G43019 Migraine without aura, intractable, without status migrainosus: Secondary | ICD-10-CM | POA: Diagnosis not present

## 2021-06-29 DIAGNOSIS — G518 Other disorders of facial nerve: Secondary | ICD-10-CM | POA: Diagnosis not present

## 2021-06-29 DIAGNOSIS — G43719 Chronic migraine without aura, intractable, without status migrainosus: Secondary | ICD-10-CM | POA: Diagnosis not present

## 2021-06-30 ENCOUNTER — Encounter: Payer: Self-pay | Admitting: Hematology and Oncology

## 2021-06-30 ENCOUNTER — Inpatient Hospital Stay: Payer: Medicare PPO | Attending: Hematology and Oncology

## 2021-06-30 ENCOUNTER — Other Ambulatory Visit: Payer: Self-pay

## 2021-06-30 ENCOUNTER — Inpatient Hospital Stay: Payer: Medicare PPO | Admitting: Hematology and Oncology

## 2021-06-30 VITALS — BP 156/91 | HR 84 | Temp 97.7°F | Resp 16 | Ht 63.0 in | Wt 143.3 lb

## 2021-06-30 DIAGNOSIS — D696 Thrombocytopenia, unspecified: Secondary | ICD-10-CM | POA: Diagnosis not present

## 2021-06-30 LAB — CBC WITH DIFFERENTIAL (CANCER CENTER ONLY)
Abs Immature Granulocytes: 0.03 10*3/uL (ref 0.00–0.07)
Basophils Absolute: 0 10*3/uL (ref 0.0–0.1)
Basophils Relative: 0 %
Eosinophils Absolute: 0.2 10*3/uL (ref 0.0–0.5)
Eosinophils Relative: 2 %
HCT: 38.2 % (ref 36.0–46.0)
Hemoglobin: 13 g/dL (ref 12.0–15.0)
Immature Granulocytes: 0 %
Lymphocytes Relative: 14 %
Lymphs Abs: 1.3 10*3/uL (ref 0.7–4.0)
MCH: 31 pg (ref 26.0–34.0)
MCHC: 34 g/dL (ref 30.0–36.0)
MCV: 91.2 fL (ref 80.0–100.0)
Monocytes Absolute: 0.8 10*3/uL (ref 0.1–1.0)
Monocytes Relative: 8 %
Neutro Abs: 7.1 10*3/uL (ref 1.7–7.7)
Neutrophils Relative %: 76 %
Platelet Count: 61 10*3/uL — ABNORMAL LOW (ref 150–400)
RBC: 4.19 MIL/uL (ref 3.87–5.11)
RDW: 12.5 % (ref 11.5–15.5)
WBC Count: 9.4 10*3/uL (ref 4.0–10.5)
nRBC: 0 % (ref 0.0–0.2)

## 2021-06-30 LAB — CMP (CANCER CENTER ONLY)
ALT: 11 U/L (ref 0–44)
AST: 15 U/L (ref 15–41)
Albumin: 4.3 g/dL (ref 3.5–5.0)
Alkaline Phosphatase: 73 U/L (ref 38–126)
Anion gap: 6 (ref 5–15)
BUN: 22 mg/dL (ref 8–23)
CO2: 27 mmol/L (ref 22–32)
Calcium: 10.6 mg/dL — ABNORMAL HIGH (ref 8.9–10.3)
Chloride: 106 mmol/L (ref 98–111)
Creatinine: 1.09 mg/dL — ABNORMAL HIGH (ref 0.44–1.00)
GFR, Estimated: 52 mL/min — ABNORMAL LOW (ref >60)
Glucose, Bld: 72 mg/dL (ref 70–99)
Potassium: 4.5 mmol/L (ref 3.5–5.1)
Sodium: 139 mmol/L (ref 135–145)
Total Bilirubin: 0.7 mg/dL (ref 0.3–1.2)
Total Protein: 7.2 g/dL (ref 6.5–8.1)

## 2021-06-30 NOTE — Progress Notes (Signed)
?[place] ?[phone]) [phone]   [phone] ? ?PROGRESS NOTE ? ?Patient Care Team: ?Unk Pinto, MD as PCP - General (Internal Medicine) ?Belva Crome, MD as PCP - Cardiology (Cardiology) ?Corliss Parish, [name] ? ? ?CHIEF COMPLAINTS/PURPOSE OF CONSULTATION:  ?Thrombocytopenia ? ?HISTORY OF PRESENTING ILLNESS:  ?Jillian Rodgers 76 y.o. female returns for a follow up for thrombocytopenia.  ? ?[name] is here for follow-up.  She has been doing relatively well since her last visit.  She spent the past few weeks in [place], was exercising regularly, eating healthy and has lost about 10 pounds intentionally.  No bleeding concerns.  No hematochezia or melena.  No other B symptoms.  Overall she feels pretty healthy.  Rest of the pertinent 10 point ROS reviewed and negative ?MEDICAL HISTORY:  ?Past Medical History:  ?Diagnosis Date  ? 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 Degenerative joint disease   ? GERD (gastroesophageal reflux disease)   ? History of kidney stones   ? Hyperlipidemia   ? Hypertension   ? off of b/p meds due to blood pressure fluctuating - discontinued by DR Melford Aase   ? Hypothyroidism   ? Inguinal hernia 02/19/2019  ? Migraines   ? neurontin helps  ? Pneumonia   ? hx of walking pneumonia   ? ? ?SURGICAL HISTORY: ?Past Surgical History:  ?Procedure Laterality Date  ? BILATERAL CATARACT SURGERY     ? BLERPHOROPLASTY \    ? CHOLECYSTECTOMY  1996  ? open  ? COLONOSCOPY  2009  ? recommended 10 year f/u  ? HERNIA REPAIR  1985  ? umb hernia rpr  ? HERNIA REPAIR  2005 & 2009  ? ventral hernia rpr  ? INGUINAL HERNIA REPAIR Left 04/30/2020  ? Procedure: OPEN LEFT INGUINAL HERNIA REPAIR WITH MESH;  Surgeon: Clovis Riley, MD;  Location: [facility];  Service: General;  Laterality: Left;  ? [place]  ? SKIN CANCER REMOVAL     ? TONSILLECTOMY    ? UPPER GI ENDOSCOPY    ? VENTRAL HERNIA REPAIR    ? ? ?SOCIAL  HISTORY: ?Social History  ? ?Socioeconomic History  ? Marital status: Married  ?  Spouse name: Not on file  ? Number of children: 2  ? Years of education: 32  ? Highest education level: Not on file  ?Occupational History  ? Not on file  ?Tobacco Use  ? Smoking status: Never  ? Smokeless tobacco: Never  ?Vaping Use  ? Vaping Use: Never used  ?Substance and Sexual Activity  ? Alcohol use: No  ? Drug use: No  ? Sexual activity: Yes  ?Other Topics Concern  ? Not on file  ?Social History Narrative  ? Not on file  ? ?Social Determinants of Health  ? ?Financial Resource Strain: Not on file  ?Food Insecurity: Not on file  ?Transportation Needs: Not on file  ?Physical Activity: Not on file  ?Stress: Not on file  ?Social Connections: Not on file  ?Intimate Partner Violence: Not on file  ? ? ?FAMILY HISTORY: ?Family History  ?Problem Relation Age of Onset  ? Ovarian cancer Mother   ? Heart disease Mother   ? Cancer Mother   ? Uterine cancer Mother   ? Hypertension Mother   ? Breast cancer Sister   ? Stroke Paternal Uncle   ? Congestive Heart Failure Father   ? Gout Maternal Aunt   ? Colon cancer  Neg Hx   ? Esophageal cancer Neg Hx   ? Stomach cancer Neg Hx   ? Rectal cancer Neg Hx   ? ? ?ALLERGIES:  has No Known Allergies. ? ?MEDICATIONS:  ?Current Outpatient Medications  ?Medication Sig Dispense Refill  ? Ascorbic Acid (VITAMIN C) 500 MG CAPS Take 500 mg by mouth daily.     ? BOTOX 100 units SOLR injection For migraines    ? cetirizine (ZYRTEC) 10 MG tablet Take 10 mg by mouth daily.    ? Cholecalciferol (VITAMIN D PO) Take 5,000 Units by mouth daily.    ? gabapentin (NEURONTIN) 600 MG tablet Take 900-1,200 mg by mouth See admin instructions. Take 900 mg by mouth in morning and 1200 mg at bedtime    ? levothyroxine (SYNTHROID) 50 MCG tablet Take  1 tablet  Daily  on an empty stomach with only water for 30 minutes & no Antacid meds, Calcium or Magnesium for 4 hours & avoid Biotin 90 tablet 3  ? pantoprazole (PROTONIX) 40 MG  tablet Take 1 tablet Daily for Acid Indigestion & Heartburn 90 tablet 0  ? pravastatin (PRAVACHOL) 40 MG tablet TAKE 1 TABLET BY MOUTH AT BEDTIME FOR CHOLESTEROL 90 tablet 3  ? zinc gluconate 50 MG tablet Take 50 mg by mouth daily.    ? ?No current facility-administered medications for this visit.  ? ? ?REVIEW OF SYSTEMS:   ?Constitutional: ( - ) fevers, ( - )  chills , ( - ) night sweats ?Eyes: ( - ) blurriness of vision, ( - ) double vision, ( - ) watery eyes ?Ears, nose, mouth, throat, and face: ( - ) mucositis, ( - ) sore throat ?Respiratory: ( - ) cough, ( - ) dyspnea, ( - ) wheezes ?Cardiovascular: ( - ) palpitation, ( - ) chest discomfort, ( - ) lower extremity swelling ?Gastrointestinal:  ( - ) nausea, ( - ) heartburn, ( - ) change in bowel habits ?Skin: ( - ) abnormal skin rashes ?Lymphatics: ( - ) new lymphadenopathy, ( - ) easy bruising ?Neurological: ( - ) numbness, ( - ) tingling, ( - ) new weaknesses ?Behavioral/Psych: ( - ) mood change, ( - ) new changes  ?All other systems were reviewed with the patient and are negative. ? ?PHYSICAL EXAMINATION: ?ECOG PERFORMANCE STATUS: 0 - Asymptomatic ? ?Vitals:  ? 06/30/21 1142  ?BP: (!) 156/91  ?Pulse: 84  ?Resp: 16  ?Temp: 97.7 ?F (36.5 ?C)  ?SpO2: 98%  ? ?Filed Weights  ? 06/30/21 1142  ?Weight: 143 lb 4.8 oz (65 kg)  ? ? ?Physical Exam ?Constitutional:   ?   Appearance: Normal appearance.  ?Cardiovascular:  ?   Rate and Rhythm: Normal rate and regular rhythm.  ?   Pulses: Normal pulses.  ?   Heart sounds: Normal heart sounds.  ?Pulmonary:  ?   Effort: Pulmonary effort is normal.  ?   Breath sounds: Normal breath sounds.  ?Abdominal:  ?   General: Abdomen is flat. Bowel sounds are normal.  ?   Palpations: Abdomen is soft.  ?Musculoskeletal:     ?   General: No swelling. Normal range of motion.  ?   Cervical back: Normal range of motion and neck supple. No rigidity.  ?Lymphadenopathy:  ?   Cervical: No cervical adenopathy.  ?Skin: ?   General: Skin is warm and  dry.  ?Neurological:  ?   General: No focal deficit present.  ?   Mental Status: She is alert.  ? ? ? ?  LABORATORY DATA:  ?I have reviewed the data as listed ?CBC Latest Ref Rng & Units 03/08/2021 01/31/2021 12/30/2020  ?WBC 4.0 - 10.5 K/uL 6.5 5.0 5.5  ?Hemoglobin 12.0 - 15.0 g/dL 12.7 13.8 13.1  ?Hematocrit 36.0 - 46.0 % 38.7 41.7 39.0  ?Platelets 150 - 400 K/uL 63(L) 70(L) 60(L)  ? ? ?CMP Latest Ref Rng & Units 03/08/2021 02/01/2021 02/01/2021  ?Glucose 70 - 99 mg/dL 73 - 81  ?BUN 8 - 23 mg/dL 21 - 21  ?Creatinine 0.44 - 1.00 mg/dL 1.18(H) - 1.09(H)  ?Sodium 135 - 145 mmol/L 140 - 140  ?Potassium 3.5 - 5.1 mmol/L 4.3 - 4.7  ?Chloride 98 - 111 mmol/L 107 - 106  ?CO2 22 - 32 mmol/L 26 - 29  ?Calcium 8.9 - 10.3 mg/dL 10.2 10.1 10.1  ?Total Protein 6.5 - 8.1 g/dL 6.6 - 6.6  ?Total Bilirubin 0.3 - 1.2 mg/dL 0.6 - 0.7  ?Alkaline Phos 38 - 126 U/L 89 - -  ?AST 15 - 41 U/L 18 - 17  ?ALT 0 - 44 U/L 13 - 15  ? ? ?ASSESSMENT & PLAN ?Keyera Hattabaugh is a 76 y.o. female who presents for a follow up for thrombocytopenia.  ? ?#Thrombocytopenia: ?--Patient has undergone serologic workup on November 2021 without no evidence of hepatitis, hypothyroidism, nutritional deficiencies.  ?--MR of the abdomen from May 2021 shows no evidence of liver disease or splenomegaly.  ?--Bone marrow biopsy from 03/19/2020 showed hypercellularity, no evidence of dyspoeisis, megakaryocytic hyperplasia.  ?--Flow cytometry negative for any monoclonal B cell population. Cytogenetics was normal.  ?--Labs today show overall stable platelet count at 60K. ?--Likely etiology is immune mediated versus early bone marrow disorder. Continue to monitor for now as platelet count has been stable for several years.  ?--Strict precautions for bleeding were given. She will refrain from using aspirin or NSAIDs.  ?--She is currently on Eliquis for A.fib. Plan to continue at this time as per her cardiology team.  Okay to continue Eliquis at this time since her  platelet count is greater than 50,000 ?--RTC in 3 months for labs in 6 months for follow-up.  She was again encouraged to call us immediately if she has any bleeding concerns. ?No concerns on physical examinat

## 2021-07-05 ENCOUNTER — Other Ambulatory Visit: Payer: Self-pay | Admitting: Internal Medicine

## 2021-07-05 ENCOUNTER — Encounter: Payer: Self-pay | Admitting: Adult Health

## 2021-07-05 ENCOUNTER — Other Ambulatory Visit: Payer: Self-pay

## 2021-07-05 ENCOUNTER — Encounter: Payer: Self-pay | Admitting: Hematology and Oncology

## 2021-07-05 ENCOUNTER — Ambulatory Visit (INDEPENDENT_AMBULATORY_CARE_PROVIDER_SITE_OTHER): Payer: Medicare PPO | Admitting: Adult Health

## 2021-07-05 ENCOUNTER — Ambulatory Visit
Admission: RE | Admit: 2021-07-05 | Discharge: 2021-07-05 | Disposition: A | Discharge status: Home / Self Care | Payer: Medicare PPO | Source: Ambulatory Visit | Attending: Adult Health | Admitting: Adult Health

## 2021-07-05 VITALS — BP 130/78 | HR 80 | Temp 98.2°F | Wt 142.0 lb

## 2021-07-05 DIAGNOSIS — Z1152 Encounter for screening for COVID-19: Secondary | ICD-10-CM

## 2021-07-05 DIAGNOSIS — R6889 Other general symptoms and signs: Secondary | ICD-10-CM

## 2021-07-05 DIAGNOSIS — R058 Other specified cough: Secondary | ICD-10-CM

## 2021-07-05 DIAGNOSIS — K4090 Unilateral inguinal hernia, without obstruction or gangrene, not specified as recurrent: Secondary | ICD-10-CM

## 2021-07-05 DIAGNOSIS — K219 Gastro-esophageal reflux disease without esophagitis: Secondary | ICD-10-CM

## 2021-07-05 DIAGNOSIS — J209 Acute bronchitis, unspecified: Secondary | ICD-10-CM | POA: Diagnosis not present

## 2021-07-05 DIAGNOSIS — R059 Cough, unspecified: Secondary | ICD-10-CM | POA: Diagnosis not present

## 2021-07-05 LAB — POCT INFLUENZA A/B
Influenza A, POC: NEGATIVE
Influenza B, POC: NEGATIVE

## 2021-07-05 LAB — POC COVID19 BINAXNOW: SARS Coronavirus 2 Ag: NEGATIVE

## 2021-07-05 MED ORDER — TRELEGY ELLIPTA 200-62.5-25 MCG/ACT IN AEPB: 1.0000 | INHALATION_SPRAY | Freq: Every day | RESPIRATORY_TRACT | 0 refills | Status: DC

## 2021-07-05 MED ORDER — PANTOPRAZOLE SODIUM 40 MG PO TBEC: DELAYED_RELEASE_TABLET | ORAL | 3 refills | Status: DC

## 2021-07-05 MED ORDER — BENZONATATE 200 MG PO CAPS: ORAL_CAPSULE | ORAL | 1 refills | Status: DC

## 2021-07-05 NOTE — Patient Instructions (Addendum)
INFORMATION ABOUT YOUR XRAY ?[facility] ?Can walk into [address] for an Insurance account manager. They will have the order and take you back. You do not any paper work, I should get the result back today or tomorrow.  ?Can call [phone] to schedule an appointment if you wish.  ? ? ? ?Try fexofenadine/allegra 180 mg once daily instead of zyrtec  ? ?Claritin or xyzal are also good options ?Plan to rotate allergy pill every 4-6 months.  ? ? ?Trelegy ellipta - 1 puff once daily, hold in lungs for 10-15 sec if possible ?Brush teeth/gargle after each use ? ? ?Acute Bronchitis, Adult ?Acute bronchitis is sudden inflammation of the main airways (bronchi) that come off the windpipe (trachea) in the lungs. The swelling causes the airways to get smaller and make more mucus than normal. This can make it hard to breathe and can cause coughing or noisy breathing (wheezing). ?Acute bronchitis may last several weeks. The cough may last longer. Allergies, asthma, and exposure to smoke may make the condition worse. ?What are the causes? ?This condition can be caused by germs and by substances that irritate the lungs, including: ?Cold and flu viruses. The most common cause of this condition is the virus that causes the common cold. ?Bacteria. This is less common. ?Breathing in substances that irritate the lungs, including: ?Smoke from cigarettes and other forms of tobacco. ?Dust and pollen. ?Fumes from household cleaning products, gases, or burned fuel. ?Indoor or outdoor air pollution. ?What increases the risk? ?The following factors may make you more likely to develop this condition: ?A weak body's defense system, also called the immune system. ?A condition that affects your lungs and breathing, such as asthma. ?What are the signs or symptoms? ?Common symptoms of this condition include: ?Coughing. This may bring up clear, yellow, or green mucus from your lungs (sputum). ?Wheezing. ?Runny or stuffy nose. ?Having too much mucus  in your lungs (chest congestion). ?Shortness of breath. ?Aches and pains, including sore throat or chest. ?How is this diagnosed? ?This condition is usually diagnosed based on: ?Your symptoms and medical history. ?A physical exam. ?You may also have other tests, including tests to rule out other conditions, such as pneumonia. These tests include: ?A test of lung function. ?Test of a mucus sample to look for the presence of bacteria. ?Tests to check the oxygen level in your blood. ?Blood tests. ?Chest X-ray. ?How is this treated? ?Most cases of acute bronchitis clear up over time without treatment. Your health care provider may recommend: ?Drinking more fluids to help thin your mucus so it is easier to cough up. ?Taking inhaled medicine (inhaler) to improve air flow in and out of your lungs. ?Using a vaporizer or a humidifier. These are machines that add water to the air to help you breathe better. ?Taking a medicine that thins mucus and clears congestion (expectorant). ?Taking a medicine that prevents or stops coughing (cough suppressant). ?It is notcommon to take an antibiotic medicine for this condition. ?Follow these instructions at home: ? ?Take over-the-counter and prescription medicines only as told by your health care provider. ?Use an inhaler, vaporizer, or humidifier as told by your health care provider. ?Take two teaspoons (10 mL) of honey at bedtime to lessen coughing at night. ?Drink enough fluid to keep your urine pale yellow. ?Do not use any products that contain nicotine or tobacco. These products include cigarettes, chewing tobacco, and vaping devices, such as e-cigarettes. If you need help quitting, ask your health care provider. ?  Get plenty of rest. ?Return to your normal activities as told by your health care provider. Ask your health care provider what activities are safe for you. ?Keep all follow-up visits. This is important. ?How is this prevented? ?To lower your risk of getting this condition  again: ?Wash your hands often with soap and water for at least 20 seconds. If soap and water are not available, use hand sanitizer. ?Avoid contact with people who have cold symptoms. ?Try not to touch your mouth, nose, or eyes with your hands. ?Avoid breathing in smoke or chemical fumes. Breathing smoke or chemical fumes will make your condition worse. ?Get the flu shot every year. ?Contact a health care provider if: ?Your symptoms do not improve after 2 weeks. ?You have trouble coughing up the mucus. ?Your cough keeps you awake at night. ?You have a fever. ?Get help right away if you: ?Cough up blood. ?Feel pain in your chest. ?Have severe shortness of breath. ?Faint or keep feeling like you are going to faint. ?Have a severe headache. ?Have a fever or chills that get worse. ?These symptoms may represent a serious problem that is an emergency. Do not wait to see if the symptoms will go away. Get medical help right away. Call your local emergency services (911 in the [place] Do not drive yourself to the hospital. ?Summary ?Acute bronchitis is inflammation of the main airways (bronchi) that come off the windpipe (trachea) in the lungs. The swelling causes the airways to get smaller and make more mucus than normal. ?Drinking more fluids can help thin your mucus so it is easier to cough up. ?Take over-the-counter and prescription medicines only as told by your health care provider. ?Do not use any products that contain nicotine or tobacco. These products include cigarettes, chewing tobacco, and vaping devices, such as e-cigarettes. If you need help quitting, ask your health care provider. ?Contact a health care provider if your symptoms do not improve after 2 weeks. ?This information is not intended to replace advice given to you by your health care provider. Make sure you discuss any questions you have with your health care provider. ?Document Revised: 08/04/2020 Document Reviewed: 08/04/2020 ?Elsevier Patient  Education ? [place]. ? ?

## 2021-07-05 NOTE — Progress Notes (Signed)
Assessment and Plan: ? ?Diagnoses and all orders for this visit: ? ?Acute bronchitis, unspecified organism ?CXR neg, covid 19 and flu negative ?Consistent with viral bronchitis ?Discussed the importance of avoiding unnecessary antibiotic therapy. ?Suggested symptomatic OTC remedies. ?Nasal saline spray for congestion. ?Nasal steroids, allergy pill, oral steroids offered - declined ?Follow up as needed. ?-     benzonatate (TESSALON) 200 MG capsule; Take 1 cap three times a day as needed. ?-     Fluticasone-Umeclidin-Vilant (TRELEGY ELLIPTA) 200-62.5-25 MCG/ACT AEPB; Inhale 1 puff into the lungs daily. ? ?Cough productive of purulent sputum ?-     DG Chest 2 View; Future - negative for acute findings ? ?Encounter for screening for COVID-19 ?-     POC COVID-19 ? ?Flu-like symptoms ?-     POCT Influenza A/B ? ?Right inguinal hernia ?-     Ambulatory referral to General Surgery ? ?Further disposition pending results of labs. Discussed med's effects and SE's.   ?Over 30 minutes of exam, counseling, chart review, and critical decision making was performed.  ? ?Future Appointments  ?Date Time Provider [place]  ?07/18/2021  2:00 PM Unk Pinto, MD [facility] None  ?07/28/2021  8:00 PM Sueanne Margarita, MD [facility]  ?09/28/2021 11:00 AM [facility] None  ?10/20/2021  2:30 PM Liane Comber, NP [facility] None  ?12/29/2021 11:30 AM Benay Pike, MD [facility] None  ? ? ?------------------------------------------------------------------------------------------------------------------ ? ? ?HPI ?BP 130/78   Pulse 80   Temp 98.2 ?F (36.8 ?C)   Wt 142 lb (64.4 kg)   SpO2 99%   BMI 25.15 kg/m?  ?76 y.o.female presents for evaluation of 5 days of URI. She tested negative for covid 19 and influenza today prior to being seen.  ? ?She reports had mild sore throat for about 1 week, husband was ill last week (dx with viral bronchitis) then in the last week has had productive cough x 5 days, thick,  mucoid, in the last day has had some brown and reports some scant blood on tissue this AM. She reports mild nasal congestion/congestion, and body aches 1 day last week. No clear fever, mild intermittent sensation of chills. She reports has had 2 bouts of loose stools.  ? ?She does have allergies, watery eyes, zyrtec year round, hasn't rotated in a long time.  ? ?She has taken tylenol for aching, tried tessalon perles that helped.  ? ?Hx of pneumonia and very concerned. Never smoker, no other resp hx.  ? ?Also hx of several hernias, had L inguinal repaired by Dr. Kae Heller in 04/2020, reports new intermittent R groin bulging and tenderness in the last few weeks.  ? ?Past Medical History:  ?Diagnosis Date  ? 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 Degenerative joint disease   ? GERD (gastroesophageal reflux disease)   ? History of kidney stones   ? Hyperlipidemia   ? Hypertension   ? off of b/p meds due to blood pressure fluctuating - discontinued by DR Melford Aase   ? Hypothyroidism   ? Inguinal hernia 02/19/2019  ? Migraines   ? neurontin helps  ? Pneumonia   ? hx of walking pneumonia   ?  ? ?No Known Allergies ? ?Current Outpatient Medications on File Prior to Visit  ?Medication Sig  ? Ascorbic Acid (VITAMIN C) 500 MG CAPS Take 500 mg by mouth daily.   ? BOTOX 100 units SOLR injection For migraines  ? cetirizine (ZYRTEC) 10 MG tablet Take 10 mg by mouth  daily.  ? Cholecalciferol (VITAMIN D PO) Take 5,000 Units by mouth daily.  ? gabapentin (NEURONTIN) 600 MG tablet Take 900-1,200 mg by mouth See admin instructions. Take 900 mg by mouth in morning and 1200 mg at bedtime  ? levothyroxine (SYNTHROID) 50 MCG tablet Take  1 tablet  Daily  on an empty stomach with only water for 30 minutes & no Antacid meds, Calcium or Magnesium for 4 hours & avoid Biotin  ? pantoprazole (PROTONIX) 40 MG tablet Take 1 tablet Daily for Acid Indigestion & Heartburn  ? pravastatin (PRAVACHOL) 40 MG tablet TAKE 1 TABLET BY MOUTH AT BEDTIME FOR  CHOLESTEROL  ? zinc gluconate 50 MG tablet Take 50 mg by mouth daily.  ? ?No current facility-administered medications on file prior to visit.  ? ? ?ROS: all negative except above.  ? ?Physical Exam: ? ?BP 130/78   Pulse 80   Temp 98.2 ?F (36.8 ?C)   Wt 142 lb (64.4 kg)   SpO2 99%   BMI 25.15 kg/m?  ? ?General Appearance: Well nourished, in no apparent distress. ?Eyes: PERRLA, EOMs, conjunctiva no swelling or erythema.  ?Sinuses: No Frontal/maxillary tenderness ?ENT/Mouth: Ext aud canals clear, TMs without erythema, bulging. No erythema, swelling, or exudate on post pharynx.  Tonsils not swollen or erythematous. Hearing normal.  ?Neck: Supple, thyroid normal.  ?Respiratory: Respiratory effort normal, BS equal bilaterally without rales, rhonchi, wheezing or stridor. Deep bronchitic cough spells with coarse bronchial sounds only with coughing (2 coughing episodes in 20 min).  ?Cardio: RRR with no MRGs. Brisk peripheral pulses without edema.  ?Abdomen: Soft, + BS.  Non tender, no guarding, rebound, masses. R groin with mild ? Hernia with coughing/bearing down only, mild localized tenderness.  ?Lymphatics: Non tender without lymphadenopathy.  ?Musculoskeletal: normal gait.  ?Skin: Warm, dry without rashes, lesions, ecchymosis.  ?Neuro: Normal muscle tone ?Psych: Awake and oriented X 3, normal affect, Insight and Judgment appropriate.  ? ?Izora Ribas, NP ?1:49 PM ?[facility] ? ?

## 2021-07-06 ENCOUNTER — Telehealth: Payer: Self-pay | Admitting: Adult Health

## 2021-07-06 NOTE — Telephone Encounter (Signed)
Patient's husband Eddie Dibbles) is calling on behalf of the patient and states that she is uncomfortable with taking the inhaler that was prescribed yesterday. They are wanting to know if there is an alternative in pill form? -e welch ?

## 2021-07-07 ENCOUNTER — Other Ambulatory Visit: Payer: Self-pay | Admitting: Adult Health

## 2021-07-07 ENCOUNTER — Telehealth: Payer: Self-pay | Admitting: Adult Health

## 2021-07-07 MED ORDER — DEXAMETHASONE 1 MG PO TABS: ORAL_TABLET | ORAL | 0 refills | Status: DC

## 2021-07-07 NOTE — Telephone Encounter (Signed)
Pt is now wanting steroid medication that Caryl Pina was going to prescribe, and would also like  call when it has been sent in.   ?

## 2021-07-08 ENCOUNTER — Other Ambulatory Visit: Payer: Self-pay | Admitting: Adult Health

## 2021-07-08 ENCOUNTER — Telehealth: Payer: Self-pay

## 2021-07-08 DIAGNOSIS — H109 Unspecified conjunctivitis: Secondary | ICD-10-CM

## 2021-07-08 MED ORDER — NEOMYCIN-POLYMYXIN-DEXAMETH 3.5-10000-0.1 OP SUSP
1.0000 [drp] | Freq: Four times a day (QID) | OPHTHALMIC | 0 refills | Status: DC
Start: 2021-07-08 — End: 2021-07-18

## 2021-07-08 NOTE — Telephone Encounter (Signed)
Jillian Rodgers on Tuesday and they talked about the problems with her eye, its getting worse. Both eyes are red but left eye is matted together. Please advise ?

## 2021-07-08 NOTE — Telephone Encounter (Signed)
Patient is returning your call back about her eye. Please call her back at [phone]. -e welch ?

## 2021-07-13 ENCOUNTER — Telehealth: Payer: Self-pay | Admitting: Adult Health

## 2021-07-13 NOTE — Telephone Encounter (Signed)
Has taken the eyes drops as prescribed. Her eyes are better (no longer "matting") but they are still red. Should she continue them...and if so, for how much longer? -e welch ?

## 2021-07-13 NOTE — Telephone Encounter (Signed)
Patient advised.

## 2021-07-17 ENCOUNTER — Encounter: Payer: Self-pay | Admitting: Internal Medicine

## 2021-07-17 NOTE — Progress Notes (Signed)
? ?Annual Screening/Preventative Visit ?& Comprehensive Evaluation &  Examination ? ?Future Appointments  ?Date Time Provider Department  ?07/18/2021  2:00 PM Unk Pinto, MD [facility]  ?07/28/2021  8:00 PM Sueanne Margarita, MD [facility]  ?10/20/2021  2:30 PM Liane Comber, NP [facility]  ?12/29/2021 11:30 AM Benay Pike, MD [facility]  ?07/25/2022  2:00 PM Unk Pinto, MD [facility]  ? ? ?    This very nice 76 y.o. MWF  presents for a Screening /Preventative Visit & comprehensive evaluation and management of multiple medical co-morbidities.  Patient has been followed for HTN,  CKD3a,  pAFib (10/2020),  HLD, Hypothyroidism, Prediabetes  and Vitamin D Deficiency. Patient is followed by Dr Arletha Pili Iruku ( Hematologist )  for Thrombocytopenia. Patient also has GERD controlled on her Pantoprazole.  ? ? ?     Labile HTN predates since the 1990's .  Patient was dx/d with pAFib (10/25/20)  ([facility] 4) and underwent successful CV by Dr Sherwood Gambler and is followed by Cardiologist Dr Daneen Schick . Patient's BP has been controlled at home. Patient has CKD3a & is followed by Dr Corliss Parish.  Patient denies any cardiac symptoms as chest pain, palpitations, shortness of breath, dizziness or ankle swelling. Today's BP is low normal at  100/60  ? ? ?    Patient's hyperlipidemia is controlled with diet and Pravastatin . Patient denies myalgias or other medication SE's. Last lipids were at goal : ? ?Lab Results  ?Component Value Date  ? CHOL 140 02/01/2021  ? HDL 58 02/01/2021  ? [place] 60 02/01/2021  ? TRIG 135 02/01/2021  ? CHOLHDL 2.4 02/01/2021  ? ? ?                                      Patient has been on thyroid replacement since she was dx'd with Hypothyroidism circa 1990's . ? ? ?    Patient has hx/o prediabetes (A1c 6.0% /2012) and patient denies reactive hypoglycemic symptoms, visual blurring, diabetic polys or paresthesias. Last A1c was normal & at goal : ? ?Lab Results  ?Component Value Date  ?  HGBA1C 5.5 02/01/2021  ? ? ? ?    Finally, patient has history of Vitamin D Deficiency and last Vitamin D was at goal : ? ?Lab Results  ?Component Value Date  ? VD25OH 92 02/01/2021  ? ? ? ?Current Outpatient Medications on File Prior to Visit  ?Medication Sig  ? VITAMIN  C  500 MG C Take daily.   ? BOTOX 100 units SOLR injection For migraines  ? cetirizine  10 MG tablet Take daily.  ? VITAMIN D 5,000 Units  Take  daily.  ? TRELEGY ELLIPTA 200 Inhale 1 puff  daily.  ? gabapentin 600 MG tablet Take 900 mg - morning and 1200 mg - bedtime  ? levothyroxine 50 MCG tablet Take  1 tablet  Daily   ? MAXITROL SUSP Place 1 drop into both eyes 4  times daily.   ? Pantoprazole 40 MG tablet Take  1 tablet  Daily    ? pravastatin  40 MG tablet TAKE 1 TABLET  AT BEDTIME  ? zinc 50 MG tablet Take daily.  ? ? ?No Known Allergies ? ? ?Past Medical History:  ?Diagnosis Date  ? 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 Degenerative joint disease   ? GERD (gastroesophageal reflux disease)   ? History  of kidney stones   ? Hyperlipidemia   ? Hypertension   ? Hypothyroidism   ? Inguinal hernia 02/19/2019  ? Migraines   ? neurontin helps  ? Pneumonia   ? hx of walking pneumonia   ? ? ? ?Health Maintenance  ?Topic Date Due  ? Zoster Vaccines- Shingrix (1 of 2) Never done  ? Pneumonia Vaccine 22+ Years old (2 - PPSV23 if available, else PCV20) 10/28/2015  ? COVID-19 Vaccine (5 - Booster for Pfizer series) 09/17/2020  ? INFLUENZA VACCINE  11/15/2021  ? TETANUS/TDAP  11/11/2024  ? DEXA SCAN  Completed  ? Hepatitis C Screening  Completed  ? HPV VACCINES  Aged Out  ? Fecal DNA (Cologuard)  Discontinued  ? ? ? ?Immunization History  ?Administered Date(s) Administered  ? Influenza Inj Mdck Quad  01/15/2018  ? Influenza, High Dose  02/07/2018, 12/30/2018, 02/12/2020, 02/01/2021  ? Influenza 01/24/2011, 02/02/2012, 01/16/2015  ? PFIZER SARS-COV-2 Vacc 05/12/2019, 06/02/2019, 01/22/2020, 07/23/2020  ? Pneumococcal-13 10/28/2014  ? Pneumococcal-23  11/28/2011  ? Td 11/17/2010  ? Tdap 11/12/2014  ? Zoster, Live 11/17/2010  ? ? ?Last Cologard : 02/17/2021 - Negative - Recc 3 year f/u due Nov 2025. ? ?Last MGM - 05/03/2021 - Negative ? ?Last dexaBMD - 07/05/2020     T-3.2   Osteoporosis per Dr Allyn Kenner -  ? Patient deferred recommended therapy ? ? ?Past Surgical History:  ?Procedure Laterality Date  ? BILATERAL CATARACT SURGERY     ? BLERPHOROPLASTY \    ? CHOLECYSTECTOMY  1996  ? open  ? COLONOSCOPY  2009  ? recommended 10 year f/u  ? HERNIA REPAIR  1985  ? umb hernia rpr  ? HERNIA REPAIR  2005 & 2009  ? ventral hernia rpr  ? INGUINAL HERNIA REPAIR Left 04/30/2020  ? Procedure: OPEN LEFT INGUINAL HERNIA REPAIR WITH MESH;  Surgeon: Clovis Riley, MD;  Location: [facility];  Service: General;  Laterality: Left;  ? [place]  ? SKIN CANCER REMOVAL     ? TONSILLECTOMY    ? UPPER GI ENDOSCOPY    ? VENTRAL HERNIA REPAIR    ? ? ? ?Family History  ?Problem Relation Age of Onset  ? Ovarian cancer Mother   ? Heart disease Mother   ? Cancer Mother   ? Uterine cancer Mother   ? Hypertension Mother   ? Breast cancer Sister   ? Stroke Paternal Uncle   ? Congestive Heart Failure Father   ? Gout Maternal Aunt   ? Colon cancer Neg Hx   ? Esophageal cancer Neg Hx   ? Stomach cancer Neg Hx   ? Rectal cancer Neg Hx   ? ? ? ?Social History  ? ?Tobacco Use  ? Smoking status: Never  ? Smokeless tobacco: Never  ?Vaping Use  ? Vaping Use: Never used  ?Substance Use Topics  ? Alcohol use: No  ? Drug use: No  ? ? ? ? ROS ?Constitutional: Denies fever, chills, weight loss/gain, headaches, insomnia,  night sweats, and change in appetite. Does c/o fatigue. ?Eyes: Denies redness, blurred vision, diplopia, discharge, itchy, watery eyes.  ?ENT: Denies discharge, congestion, post nasal drip, epistaxis, sore throat, earache, hearing loss, dental pain, Tinnitus, Vertigo, Sinus pain, snoring.  ?Cardio: Denies chest pain, palpitations, irregular heartbeat, syncope, dyspnea,  diaphoresis, orthopnea, PND, claudication, edema ?Respiratory: denies cough, dyspnea, DOE, pleurisy, hoarseness, laryngitis, wheezing.  ?Gastrointestinal: Denies dysphagia, heartburn, reflux, water brash, pain, cramps, nausea, vomiting, bloating, diarrhea,  constipation, hematemesis, melena, hematochezia, jaundice, hemorrhoids ?Genitourinary: Denies dysuria, frequency, urgency, nocturia, hesitancy, discharge, hematuria, flank pain ?Breast: Breast lumps, nipple discharge, bleeding.  ?Musculoskeletal: Denies arthralgia, myalgia, stiffness, Jt. Swelling, pain, limp, and strain/sprain. Denies falls. ?Skin: Denies puritis, rash, hives, warts, acne, eczema, changing in skin lesion ?Neuro: No weakness, tremor, incoordination, spasms, paresthesia, pain ?Psychiatric: Denies confusion, memory loss, sensory loss. Denies Depression. ?Endocrine: Denies change in weight, skin, hair change, nocturia, and paresthesia, diabetic polys, visual blurring, hyper / hypo glycemic episodes.  ?Heme/Lymph: No excessive bleeding, bruising, enlarged lymph nodes. ? ?Physical Exam ? ?BP 100/60   Pulse (!) 42   Temp (!) 96.6 ?F (35.9 ?C)   Ht '5\' 5"'$  (1.651 m)   Wt 142 lb (64.4 kg)   SpO2 99%   BMI 23.63 kg/m?  ? ?General Appearance: Well nourished, well groomed and in no apparent distress. ? ?Eyes: PERRLA, EOMs, conjunctiva no swelling or erythema, normal fundi and vessels. ?Sinuses: No frontal/maxillary tenderness ?ENT/Mouth: EACs patent / TMs  nl. Nares clear without erythema, swelling, mucoid exudates. Oral hygiene is good. No erythema, swelling, or exudate. Tongue normal, non-obstructing. Tonsils not swollen or erythematous. Hearing normal.  ?Neck: Supple, thyroid not palpable. No bruits, nodes or JVD. ?Respiratory: Respiratory effort normal.  BS equal and clear bilateral without rales, rhonci, wheezing or stridor. ?Cardio: Heart sounds are normal with regular rate and rhythm and no murmurs, rubs or gallops. Peripheral pulses are normal  and equal bilaterally without edema. No aortic or femoral bruits. ?Chest: symmetric with normal excursions and percussion. ?Breasts: Symmetric, without lumps, nipple discharge, retractions, or fibrocystic changes.

## 2021-07-17 NOTE — Patient Instructions (Signed)

## 2021-07-18 ENCOUNTER — Encounter: Payer: Self-pay | Admitting: Internal Medicine

## 2021-07-18 ENCOUNTER — Ambulatory Visit: Payer: Medicare PPO | Admitting: Internal Medicine

## 2021-07-18 VITALS — BP 100/60 | HR 42 | Temp 96.6°F | Ht 65.0 in | Wt 142.0 lb

## 2021-07-18 DIAGNOSIS — Z136 Encounter for screening for cardiovascular disorders: Secondary | ICD-10-CM | POA: Diagnosis not present

## 2021-07-18 DIAGNOSIS — Z79899 Other long term (current) drug therapy: Secondary | ICD-10-CM | POA: Diagnosis not present

## 2021-07-18 DIAGNOSIS — E039 Hypothyroidism, unspecified: Secondary | ICD-10-CM | POA: Diagnosis not present

## 2021-07-18 DIAGNOSIS — K2100 Gastro-esophageal reflux disease with esophagitis, without bleeding: Secondary | ICD-10-CM

## 2021-07-18 DIAGNOSIS — E782 Mixed hyperlipidemia: Secondary | ICD-10-CM

## 2021-07-18 DIAGNOSIS — E559 Vitamin D deficiency, unspecified: Secondary | ICD-10-CM | POA: Diagnosis not present

## 2021-07-18 DIAGNOSIS — N1831 Chronic kidney disease, stage 3a: Secondary | ICD-10-CM

## 2021-07-18 DIAGNOSIS — R7309 Other abnormal glucose: Secondary | ICD-10-CM | POA: Diagnosis not present

## 2021-07-18 DIAGNOSIS — R0989 Other specified symptoms and signs involving the circulatory and respiratory systems: Secondary | ICD-10-CM | POA: Diagnosis not present

## 2021-07-18 DIAGNOSIS — Z0000 Encounter for general adult medical examination without abnormal findings: Secondary | ICD-10-CM

## 2021-07-18 DIAGNOSIS — Z8249 Family history of ischemic heart disease and other diseases of the circulatory system: Secondary | ICD-10-CM

## 2021-07-18 DIAGNOSIS — D696 Thrombocytopenia, unspecified: Secondary | ICD-10-CM

## 2021-07-18 DIAGNOSIS — I480 Paroxysmal atrial fibrillation: Secondary | ICD-10-CM

## 2021-07-18 DIAGNOSIS — M816 Localized osteoporosis [Lequesne]: Secondary | ICD-10-CM | POA: Diagnosis not present

## 2021-07-18 DIAGNOSIS — Z0001 Encounter for general adult medical examination with abnormal findings: Secondary | ICD-10-CM

## 2021-07-19 LAB — COMPLETE METABOLIC PANEL WITH GFR
AG Ratio: 1.6 (calc) (ref 1.0–2.5)
ALT: 14 U/L (ref 6–29)
AST: 14 U/L (ref 10–35)
Albumin: 3.7 g/dL (ref 3.6–5.1)
Alkaline phosphatase (APISO): 62 U/L (ref 37–153)
BUN: 25 mg/dL (ref 7–25)
CO2: 27 mmol/L (ref 20–32)
Calcium: 9.5 mg/dL (ref 8.6–10.4)
Chloride: 107 mmol/L (ref 98–110)
Creat: 0.97 mg/dL (ref 0.60–1.00)
Globulin: 2.3 g/dL (calc) (ref 1.9–3.7)
Glucose, Bld: 56 mg/dL — ABNORMAL LOW (ref 65–99)
Potassium: 4.1 mmol/L (ref 3.5–5.3)
Sodium: 142 mmol/L (ref 135–146)
Total Bilirubin: 0.3 mg/dL (ref 0.2–1.2)
Total Protein: 6 g/dL — ABNORMAL LOW (ref 6.1–8.1)
eGFR: 60 mL/min/{1.73_m2} (ref >=60)

## 2021-07-19 LAB — CBC WITH DIFFERENTIAL/PLATELET
Absolute Monocytes: 672 cells/uL (ref 200–950)
Basophils Absolute: 42 cells/uL (ref 0–200)
Basophils Relative: 0.5 %
Eosinophils Absolute: 185 cells/uL (ref 15–500)
Eosinophils Relative: 2.2 %
HCT: 36.6 % (ref 35.0–45.0)
Hemoglobin: 12 g/dL (ref 11.7–15.5)
Lymphs Abs: 1798 cells/uL (ref 850–3900)
MCH: 30.8 pg (ref 27.0–33.0)
MCHC: 32.8 g/dL (ref 32.0–36.0)
MCV: 93.8 fL (ref 80.0–100.0)
MPV: 12.5 fL (ref 7.5–12.5)
Monocytes Relative: 8 %
Neutro Abs: 5704 cells/uL (ref 1500–7800)
Neutrophils Relative %: 67.9 %
Platelets: 53 10*3/uL — ABNORMAL LOW (ref 140–400)
RBC: 3.9 10*6/uL (ref 3.80–5.10)
RDW: 12.4 % (ref 11.0–15.0)
Total Lymphocyte: 21.4 %
WBC: 8.4 10*3/uL (ref 3.8–10.8)

## 2021-07-19 LAB — URINALYSIS, ROUTINE W REFLEX MICROSCOPIC
Bilirubin Urine: NEGATIVE
Glucose, UA: NEGATIVE
Hgb urine dipstick: NEGATIVE
Ketones, ur: NEGATIVE
Leukocytes,Ua: NEGATIVE
Nitrite: NEGATIVE
Protein, ur: NEGATIVE
Specific Gravity, Urine: 1.029 (ref 1.001–1.035)
pH: 5.5 (ref 5.0–8.0)

## 2021-07-19 LAB — TSH: TSH: 1.4 mIU/L (ref 0.40–4.50)

## 2021-07-19 LAB — MICROALBUMIN / CREATININE URINE RATIO
Creatinine, Urine: 147 mg/dL (ref 20–275)
Microalb Creat Ratio: 8 mcg/mg creat (ref <30)
Microalb, Ur: 1.2 mg/dL

## 2021-07-19 LAB — MAGNESIUM: Magnesium: 2.1 mg/dL (ref 1.5–2.5)

## 2021-07-19 LAB — LIPID PANEL
Cholesterol: 149 mg/dL (ref <200)
HDL: 45 mg/dL — ABNORMAL LOW (ref >=50)
LDL Cholesterol (Calc): 75 mg/dL (calc)
Non-HDL Cholesterol (Calc): 104 mg/dL (calc) (ref <130)
Total CHOL/HDL Ratio: 3.3 (calc) (ref <5.0)
Triglycerides: 197 mg/dL — ABNORMAL HIGH (ref <150)

## 2021-07-19 LAB — INSULIN, RANDOM: Insulin: 7.1 u[IU]/mL

## 2021-07-19 LAB — HEMOGLOBIN A1C
Hgb A1c MFr Bld: 5.7 % of total Hgb — ABNORMAL HIGH (ref <5.7)
Mean Plasma Glucose: 117 mg/dL
eAG (mmol/L): 6.5 mmol/L

## 2021-07-19 LAB — VITAMIN D 25 HYDROXY (VIT D DEFICIENCY, FRACTURES): Vit D, 25-Hydroxy: 99 ng/mL (ref 30–100)

## 2021-07-19 NOTE — Progress Notes (Signed)
<><><><><><><><><><><><><><><><><><><><><><><><><><><><><><><><><> ?<><><><><><><><><><><><><><><><><><><><><><><><><><><><><><><><><> ? ?-    Total Chol = 149  & LDL Chol = 75   - Both  Excellent  ? ?- Very low risk for Heart Attack  / Stroke ?<><><><><><><><><><><><><><><><><><><><><><><><><><><><><><><><><> ?<><><><><><><><><><><><><><><><><><><><><><><><><><><><><><><><><> ? ?-      Vitamin D = 99 - Excellent - Please  keep dose same  ?<><><><><><><><><><><><><><><><><><><><><><><><><><><><><><><><><> ?<><><><><><><><><><><><><><><><><><><><><><><><><><><><><><><><><> ? ?-   All Else - CBC - Kidneys - Electrolytes - Liver - Magnesium & Thyroid   ? ?- all  Normal / OK ?<><><><><><><><><><><><><><><><><><><><><><><><><><><><><><><><><> ?<><><><><><><><><><><><><><><><><><><><><><><><><><><><><><><><><> ? ?-   Keep up the [place] Work  ! ? ?<><><><><><><><><><><><><><><><><><><><><><><><><><><><><><><><><> ?<><><><><><><><><><><><><><><><><><><><><><><><><><><><><><><><><> ? ?-    ? ? ? ? ? ? ? ? ? ? ? ? ? ? ? ? ? ? ? ? ? ? ? ? ?

## 2021-07-28 ENCOUNTER — Encounter (HOSPITAL_BASED_OUTPATIENT_CLINIC_OR_DEPARTMENT_OTHER): Payer: Medicare PPO | Admitting: Cardiology

## 2021-08-10 DIAGNOSIS — M791 Myalgia, unspecified site: Secondary | ICD-10-CM | POA: Diagnosis not present

## 2021-08-10 DIAGNOSIS — G43019 Migraine without aura, intractable, without status migrainosus: Secondary | ICD-10-CM | POA: Diagnosis not present

## 2021-08-10 DIAGNOSIS — G518 Other disorders of facial nerve: Secondary | ICD-10-CM | POA: Diagnosis not present

## 2021-08-10 DIAGNOSIS — G43719 Chronic migraine without aura, intractable, without status migrainosus: Secondary | ICD-10-CM | POA: Diagnosis not present

## 2021-08-10 DIAGNOSIS — G43111 Migraine with aura, intractable, with status migrainosus: Secondary | ICD-10-CM | POA: Diagnosis not present

## 2021-08-10 DIAGNOSIS — M542 Cervicalgia: Secondary | ICD-10-CM | POA: Diagnosis not present

## 2021-08-19 ENCOUNTER — Other Ambulatory Visit: Payer: Self-pay | Admitting: Adult Health

## 2021-08-19 ENCOUNTER — Other Ambulatory Visit: Payer: Self-pay | Admitting: Internal Medicine

## 2021-08-19 DIAGNOSIS — E039 Hypothyroidism, unspecified: Secondary | ICD-10-CM

## 2021-08-19 DIAGNOSIS — J209 Acute bronchitis, unspecified: Secondary | ICD-10-CM

## 2021-08-25 ENCOUNTER — Telehealth: Payer: Self-pay | Admitting: *Deleted

## 2021-08-25 ENCOUNTER — Ambulatory Visit: Payer: Self-pay | Admitting: Surgery

## 2021-08-25 DIAGNOSIS — K449 Diaphragmatic hernia without obstruction or gangrene: Secondary | ICD-10-CM | POA: Diagnosis not present

## 2021-08-25 DIAGNOSIS — R1010 Upper abdominal pain, unspecified: Secondary | ICD-10-CM | POA: Diagnosis not present

## 2021-08-25 DIAGNOSIS — K4090 Unilateral inguinal hernia, without obstruction or gangrene, not specified as recurrent: Secondary | ICD-10-CM | POA: Diagnosis not present

## 2021-08-25 NOTE — Telephone Encounter (Signed)
? ?  Pre-operative Risk Assessment  ?  ?Patient Name: Jillian Rodgers  ?DOB: 08/04/1945 ?MRN: [id number]  ? ?  ? ?Request for Surgical Clearance   ? ?Procedure:   HERNIA SURGERY ? ?Date of Surgery:  Clearance TBD                              ?   ?Surgeon:  DR. Jens Som ?Surgeon's Group or Practice Name:  [facility] ?Phone number:  [phone] ?Fax number:  [phone] ATTN: Carlene Coria, CMA ?  ?Type of Clearance Requested:   ?- Medical  ?  ?Type of Anesthesia:  General  ?  ?Additional requests/questions:   ? ?Signed, ?Julaine Hua   ?08/25/2021, 1:53 PM  ? ?

## 2021-08-25 NOTE — H&P (Signed)
Jillian Rodgers [id number]    Referring Provider:  Self     Subjective    Chief Complaint: Inguinal Hernia       History of Present Illness:    76-year-old woman who is status post open left inguinal hernia repair with mesh in January 2022, she had a large left indirect inguinal hernia at that time.  She has been having worsening pain in the right groin with associated mass which is variably present.  This is aggravated by standing or walking versus longer amounts of time and is quite severe.  Denies any GI symptoms associated with this.  She is also been noting epigastric and supraumbilical pain and severe abdominal tenderness here.  Denies significant issues with vomiting, regurgitation, chest pain, decreased appetite, although she does have well-controlled intermittent reflux.   Intiial visit 02/27/19: This is a very pleasant 76-year-old woman who presents with a left inguinal hernia. She has noticed a bulge in this area for some time which is occasionally painful.  It is always reducible.  She does note intermittent constipation.  No prior inguinal hernia surgery but she has had an open cholecystectomy as well as an umbilical hernia repair followed by laparoscopic recurrent ventral hernia repair. CT scan in 2018 demonstrates a left inguinal hernia containing bowel however interestingly she had an ultrasound this year which was negative. Medical history notable for hypertension, hyperlipidemia, deficiency, GERD, prediabetes, hypothyroid. Follow up 10/31/19: returns and is ready to proceed with scheduling for a left inguinal hernia.  Reports no changes in the hernia since her last visit.  She has noticed that on some occasions it will be less swollen and painful after she urinates.  She is having surgery for hammertoe in September and is hoping to have the hernia repaired before that.     Review of Systems: A complete review of systems was obtained from the patient.  I have reviewed this  information and discussed as appropriate with the patient.  See HPI as well for other ROS.     Medical History:     Past Medical History:  Diagnosis Date   Arrhythmia     Arthritis     Chronic kidney disease     GERD (gastroesophageal reflux disease)     History of motion sickness     Thyroid disease           Patient Active Problem List  Diagnosis   GERD (gastroesophageal reflux disease)   Breast hematoma   CKD (chronic kidney disease), stage III (CMS-HCC)   CME (cystoid macular edema), right   Degenerative joint disease   Essential hypertension   Hyperlipidemia   Hypothyroidism   Inguinal pain   Medication management   Osteopenia   Prediabetes   Pseudophakia   Secondary iritis of right eye   Thrombocytopenia (CMS-HCC)   Upper airway cough syndrome   Vitamin D deficiency   Overweight (BMI 25.0-29.9)           Past Surgical History:  Procedure Laterality Date   BLEPHAROPLASTY UPPER EYELID Bilateral 04/23/2018    Procedure: BLEPHAROPLASTY, UPPER EYELID; WITH EXCESSIVE SKIN WEIGHTING DOWN LID;  Surgeon: Woodward, Julie Ann, MD;  [facility];  Service: Ophthalmology;  Laterality: Bilateral;  with LMA    BLEPHAROPLASTY LOWER EYELID Bilateral 04/23/2018    Procedure: BLEPHAROPLASTY, LOWER EYELID;  Surgeon: Woodward, Julie Ann, MD;  [facility];  Service: Ophthalmology;  Laterality: Bilateral;  with LMA   DERMABRASION FACE Bilateral 04/23/2018      Procedure: DERMABRASION; SEGMENTAL, LOWER LIDS;  Surgeon: Woodward, Julie Ann, MD;  [facility];  Service: Ophthalmology;  Laterality: Bilateral;  with LMA   HERNIA REPAIR       LENS EYE SURGERY          No Known Allergies         Current Outpatient Medications on File Prior to Visit  Medication Sig Dispense Refill   ascorbic acid, vitamin C, 500 mg Cap Take by mouth every morning         aspirin 81 MG chewable tablet Take 81 mg by mouth once daily       cetirizine (ZYRTEC) 10 mg capsule  nightly         cholecalciferol, vitamin D3, (CHOLECALCIFEROL, VIT D3,,BULK,) 100,000 unit/gram Powd Take by mouth       gabapentin (NEURONTIN) 600 MG tablet Preventative for headaches    0   hyoscyamine (LEVSIN/SL) 0.125 mg SL tablet DISSOLVE 1 TO 2 TABLETS UNDER THE TONGUE EVERY 4 HOURS AS NEEDED FOR NAUSEA OR CRAMPING OR DIARRHEA       levothyroxine (SYNTHROID, LEVOTHROID) 50 MCG tablet TK 1 T PO QAM OES   1   LORazepam (ATIVAN) 0.5 MG tablet lorazepam 0.5 mg tablet       onabotulinumtoxinA (BOTOX INJ) Inject as directed Every 6-8 weeks for headaches       pantoprazole (PROTONIX) 40 MG DR tablet every morning         pravastatin (PRAVACHOL) 40 MG tablet TK 1 T PO HS       ranitidine (ZANTAC) 300 MG tablet nightly          No current facility-administered medications on file prior to visit.           Family History  Problem Relation Age of Onset   Colon cancer Mother     High blood pressure (Hypertension) Father     Breast cancer Sister     Anesthesia problems Neg Hx        Social History       Tobacco Use  Smoking Status Never  Smokeless Tobacco Never      Social History        Socioeconomic History   Marital status: Married  Tobacco Use   Smoking status: Never   Smokeless tobacco: Never  Vaping Use   Vaping Use: Never used  Substance and Sexual Activity   Alcohol use: Not Currently   Drug use: Never      Objective:         Vitals:    08/25/21 1106  BP: 120/66  Pulse: 83  Temp: 36.7 C (98 F)  SpO2: 96%  Weight: 65.4 kg (144 lb 3.2 oz)  Height: 162.6 cm (5' 4")    Body mass index is 24.75 kg/m.   She is alert, well-appearing Unlabored respirations Abdomen is soft, exquisitely tender along the upper midline just above the umbilicus and about 10 cm cephalad, minimally tender below the umbilicus, nondistended. No mass or organomegaly. Well-healed right upper quadrant scar, umbilical scar. Port sites from prior laparoscopic hernia repair are not really  visible.  Left inguinal hernia repair is intact, she does have a moderate partially incarcerated right inguinal hernia   Assessment and Plan:  Diagnoses and all orders for this visit:   Non-recurrent unilateral inguinal hernia without obstruction or gangrene   Hiatal hernia   Pain of upper abdomen     I do recommend proceeding with open repair of   the right inguinal hernia.  We discussed the surgery again including technique, risks of bleeding, infection, pain, scarring, injury to intra-abdominal or retroperitoneal structures, hematoma/seroma/wound healing problems, chronic groin pain, hernia recurrence, as well as general cardiovascular/pulmonary/thromboembolic risks.  We also discussed the anatomy and surgical repair of hiatal hernia, which I do not think is indicated in this case as she has minimal and well controlled reflux and really no other symptoms related to the hiatal hernia.  I do not think her upper abdominal pain is related to this.  On exam there is no palpable ventral recurrent hernia but she is quite tender.  We will proceed with CT scan to further evaluate.  Possible that she has scar tissue related to previous ventral hernia repair/mesh that is causing abdominal wall/musculoskeletal pain, but would like to rule out anything more significant.   Tuesday Terlecki AMANDA Fronnie Urton, MD   

## 2021-08-25 NOTE — Telephone Encounter (Signed)
Preoperative team, please contact this patient and set up a phone call appointment for further cardiac evaluation.  Thank you for your help. ? ?Jossie Ng. Martie Fulgham NP-C ? ?  ?08/25/2021, 3:39 PM ?[place] ?[address] ?Office [phone] Fax [phone] ? ?

## 2021-08-25 NOTE — Telephone Encounter (Signed)
Tele pre op appt 08/31/21 @ 10 am. Med rec and consent are done.  ? ?  ?Patient Consent for Virtual Visit  ? ? ?   ? ?Jillian Rodgers has provided verbal consent on 08/25/2021 for a virtual visit (video or telephone). ? ? ?CONSENT FOR VIRTUAL VISIT FOR:  Jillian Rodgers  ?By participating in this virtual visit I agree to the following: ? ?I hereby voluntarily request, consent and authorize [facility] and its employed or contracted physicians, physician assistants, nurse practitioners or other licensed health care professionals (the Practitioner), to provide me with telemedicine health care services (the ?Services") as deemed necessary by the treating Practitioner. I acknowledge and consent to receive the Services by the Practitioner via telemedicine. I understand that the telemedicine visit will involve communicating with the Practitioner through live audiovisual communication technology and the disclosure of certain medical information by electronic transmission. I acknowledge that I have been given the opportunity to request an in-person assessment or other available alternative prior to the telemedicine visit and am voluntarily participating in the telemedicine visit. ? ?I understand that I have the right to withhold or withdraw my consent to the use of telemedicine in the course of my care at any time, without affecting my right to future care or treatment, and that the Practitioner or I may terminate the telemedicine visit at any time. I understand that I have the right to inspect all information obtained and/or recorded in the course of the telemedicine visit and may receive copies of available information for a reasonable fee.  I understand that some of the potential risks of receiving the Services via telemedicine include:  ?Delay or interruption in medical evaluation due to technological equipment failure or disruption; ?Information transmitted may not be sufficient (e.g. poor resolution of images) to  allow for appropriate medical decision making by the Practitioner; and/or  ?In rare instances, security protocols could fail, causing a breach of personal health information. ? ?Furthermore, I acknowledge that it is my responsibility to provide information about my medical history, conditions and care that is complete and accurate to the best of my ability. I acknowledge that Practitioner's advice, recommendations, and/or decision may be based on factors not within their control, such as incomplete or inaccurate data provided by me or distortions of diagnostic images or specimens that may result from electronic transmissions. I understand that the practice of medicine is not an exact science and that Practitioner makes no warranties or guarantees regarding treatment outcomes. I acknowledge that a copy of this consent can be made available to me via my patient portal ([facility]), or I can request a printed copy by calling the office of [facility].   ? ?I understand that my insurance will be billed for this visit.  ? ?I have read or had this consent read to me. ?I understand the contents of this consent, which adequately explains the benefits and risks of the Services being provided via telemedicine.  ?I have been provided ample opportunity to ask questions regarding this consent and the Services and have had my questions answered to my satisfaction. ?I give my informed consent for the services to be provided through the use of telemedicine in my medical care ? ? ? ?

## 2021-08-25 NOTE — Telephone Encounter (Signed)
Tele pre op appt 08/31/21 @ 10 am. Med rec and consent are done.  ?  ?

## 2021-08-26 DIAGNOSIS — L602 Onychogryphosis: Secondary | ICD-10-CM | POA: Diagnosis not present

## 2021-08-26 DIAGNOSIS — I739 Peripheral vascular disease, unspecified: Secondary | ICD-10-CM | POA: Diagnosis not present

## 2021-08-31 ENCOUNTER — Ambulatory Visit (INDEPENDENT_AMBULATORY_CARE_PROVIDER_SITE_OTHER): Payer: Medicare PPO | Admitting: Nurse Practitioner

## 2021-08-31 DIAGNOSIS — Z01810 Encounter for preprocedural cardiovascular examination: Secondary | ICD-10-CM | POA: Diagnosis not present

## 2021-08-31 NOTE — Progress Notes (Signed)
? ?Virtual Visit via Telephone Note  ? ?This visit type was conducted due to national recommendations for restrictions regarding the COVID-19 Pandemic (e.g. social distancing) in an effort to limit this patient's exposure and mitigate transmission in our community.  Due to her co-morbid illnesses, this patient is at least at moderate risk for complications without adequate follow up.  This format is felt to be most appropriate for this patient at this time.  The patient did not have access to video technology/had technical difficulties with video requiring transitioning to audio format only (telephone).  All issues noted in this document were discussed and addressed.  No physical exam could be performed with this format.  Please refer to the patient's chart for her  consent to telehealth for Jillian Rodgers. ? ?Evaluation Performed:  Preoperative cardiovascular risk assessment ?_____________  ? ?Date:  08/31/2021  ? ?Patient ID:  Jillian Rodgers, Jillian Rodgers 1945/05/16, MRN [id number] ?Patient Location:  ?Home ?Provider location:   ?Office ? ?Primary Care Provider:  Unk Pinto, MD ?Primary Cardiologist:  Sinclair Grooms, MD ? ?Chief Complaint  ?  ?76 y.o. y/o female with a h/o paroxysmal atrial fibrillation not on anticoagulation due to h/o thrombocytopenia, hypertension, hyperlipidemia, OSA, CKD, hypothyroidism, GERD, and inguinal hernia, who is pending hernia surgery, date TBD with Dr. Jens Som of [facility], and presents today for telephonic preoperative cardiovascular risk assessment. ? ?Past Medical History  ?  ?Past Medical History:  ?Diagnosis Date  ? 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 Degenerative joint disease   ? GERD (gastroesophageal reflux disease)   ? History of kidney stones   ? Hyperlipidemia   ? Hypothyroidism   ? Inguinal hernia 02/19/2019  ? Labile hypertension   ? Migraines   ? neurontin helps  ? Pneumonia   ? hx of walking pneumonia   ? ?Past Surgical History:   ?Procedure Laterality Date  ? BILATERAL CATARACT SURGERY     ? BLERPHOROPLASTY \    ? CHOLECYSTECTOMY  1996  ? open  ? COLONOSCOPY  2009  ? recommended 10 year f/u  ? HERNIA REPAIR  1985  ? umb hernia rpr  ? HERNIA REPAIR  2005 & 2009  ? ventral hernia rpr  ? INGUINAL HERNIA REPAIR Left 04/30/2020  ? Procedure: OPEN LEFT INGUINAL HERNIA REPAIR WITH MESH;  Surgeon: Clovis Riley, MD;  Location: [facility];  Service: General;  Laterality: Left;  ? [place]  ? SKIN CANCER REMOVAL     ? TONSILLECTOMY    ? UPPER GI ENDOSCOPY    ? VENTRAL HERNIA REPAIR    ? ? ?Allergies ? ?No Known Allergies ? ?History of Present Illness  ?  ?Jillian Rodgers is a 76 y.o. female who presents via audio/video conferencing for a telehealth visit today.  Pt was last seen in cardiology clinic on 05/02/2021 by Malka So, PA.  At that time Jillian Rodgers was doing well from a cardiac standpoint. The patient is now pending procedure as outlined above. Since her last visit, she has been stable from a cardiac standpoint. She does report tenderness in her chest above her R breast, reproducible with palpation. She describes it as a soreness, she thinks she pulled a muscle.  She denies exertional symptoms, or other symptoms concerning for angina. She denies chest pain, palpitations, dyspnea, pnd, orthopnea, n, v, dizziness, syncope, edema, weight gain, or early satiety.  She does have some discomfort from her hernia.  Otherwise, she reports denies any additional concerns today. ? ?Home Medications  ?  ?Prior to Admission medications   ?Medication Sig Start Date End Date Taking? Authorizing Provider  ?Ascorbic Acid (VITAMIN C) 500 MG CAPS Take 500 mg by mouth daily.     [provider]  ?BOTOX 100 units SOLR injection For migraines 10/21/20   [provider]  ?cetirizine (ZYRTEC) 10 MG tablet Take 10 mg by mouth daily.    [provider]  ?Cholecalciferol (VITAMIN D PO) Take 5,000 Units by mouth daily.     [provider]  ?gabapentin (NEURONTIN) 600 MG tablet Take 600 mg by mouth See admin instructions. Take 600 mg by mouth in morning and 1200 mg at bedtime    [provider]  ?levothyroxine (SYNTHROID) 50 MCG tablet TAKE 1 TABLET BY MOUTH DAILY ON A EMPTY STOMACH WITH WATER. DO NOT TAKE ANY ANTACID, CALCIUM OR MAGNESIUM FOR 4 HOURS. AVOID BIOTIN 08/19/21   Liane Comber, NP  ?pantoprazole (PROTONIX) 40 MG tablet Take  1 tablet  Daily  for Acid Indigestion & Heartburn 07/05/21   Unk Pinto, MD  ?pravastatin (PRAVACHOL) 40 MG tablet TAKE 1 TABLET BY MOUTH AT BEDTIME FOR CHOLESTEROL 05/21/21   Liane Comber, NP  ?zinc gluconate 50 MG tablet Take 50 mg by mouth daily.    [provider]  ? ? ?Physical Exam  ?  ?Vital Signs:  CHRISTEN WARDROP does not have vital signs available for review today. ? ?Given telephonic nature of communication, physical exam is limited. ?AAOx3. NAD. Normal affect.  Speech and respirations are unlabored. ? ?Accessory Clinical Findings  ?  ?None ? ?Assessment & Plan  ?  ?1.  Preoperative Cardiovascular Risk Assessment: ? ?According to the Revised Cardiac Risk Index (RCRI), her Perioperative Risk of Major Cardiac Event is (%): 0.4. Her Functional Capacity in METs is: 7.99 according to the Duke Activity Status Index (DASI). Therefore, based on ACC/AHA guidelines, patient would be at acceptable risk for the planned procedure without further cardiovascular testing. ? ?A copy of this note will be routed to requesting surgeon. ? ?Time:   ?Today, I have spent 7 minutes with the patient with telehealth technology discussing medical history, symptoms, and management plan.   ? ? ?Lenna Sciara, NP ? ?08/31/2021, 10:14 AM ? ?

## 2021-09-01 ENCOUNTER — Other Ambulatory Visit: Payer: Self-pay | Admitting: Surgery

## 2021-09-01 DIAGNOSIS — R1010 Upper abdominal pain, unspecified: Secondary | ICD-10-CM

## 2021-09-05 DIAGNOSIS — L814 Other melanin hyperpigmentation: Secondary | ICD-10-CM | POA: Diagnosis not present

## 2021-09-05 DIAGNOSIS — L821 Other seborrheic keratosis: Secondary | ICD-10-CM | POA: Diagnosis not present

## 2021-09-05 DIAGNOSIS — D225 Melanocytic nevi of trunk: Secondary | ICD-10-CM | POA: Diagnosis not present

## 2021-09-05 DIAGNOSIS — Z85828 Personal history of other malignant neoplasm of skin: Secondary | ICD-10-CM | POA: Diagnosis not present

## 2021-09-05 DIAGNOSIS — D2261 Melanocytic nevi of right upper limb, including shoulder: Secondary | ICD-10-CM | POA: Diagnosis not present

## 2021-09-21 DIAGNOSIS — G518 Other disorders of facial nerve: Secondary | ICD-10-CM | POA: Diagnosis not present

## 2021-09-21 DIAGNOSIS — M542 Cervicalgia: Secondary | ICD-10-CM | POA: Diagnosis not present

## 2021-09-21 DIAGNOSIS — M791 Myalgia, unspecified site: Secondary | ICD-10-CM | POA: Diagnosis not present

## 2021-09-21 DIAGNOSIS — G43719 Chronic migraine without aura, intractable, without status migrainosus: Secondary | ICD-10-CM | POA: Diagnosis not present

## 2021-09-27 ENCOUNTER — Ambulatory Visit
Admission: RE | Admit: 2021-09-27 | Discharge: 2021-09-27 | Disposition: A | Discharge status: Home / Self Care | Payer: Medicare PPO | Source: Ambulatory Visit | Attending: Surgery | Admitting: Surgery

## 2021-09-27 DIAGNOSIS — K4090 Unilateral inguinal hernia, without obstruction or gangrene, not specified as recurrent: Secondary | ICD-10-CM | POA: Diagnosis not present

## 2021-09-27 DIAGNOSIS — N281 Cyst of kidney, acquired: Secondary | ICD-10-CM | POA: Diagnosis not present

## 2021-09-27 DIAGNOSIS — R1010 Upper abdominal pain, unspecified: Secondary | ICD-10-CM

## 2021-09-27 DIAGNOSIS — K5730 Diverticulosis of large intestine without perforation or abscess without bleeding: Secondary | ICD-10-CM | POA: Diagnosis not present

## 2021-09-27 DIAGNOSIS — K449 Diaphragmatic hernia without obstruction or gangrene: Secondary | ICD-10-CM | POA: Diagnosis not present

## 2021-09-27 MED ORDER — IOPAMIDOL (ISOVUE-300) INJECTION 61%
100.0000 mL | Freq: Once | INTRAVENOUS | Status: AC | PRN
  Administered 2021-09-27: 100 mL via INTRAVENOUS

## 2021-09-28 ENCOUNTER — Other Ambulatory Visit: Payer: Self-pay

## 2021-09-28 ENCOUNTER — Inpatient Hospital Stay: Payer: Medicare PPO | Attending: Hematology and Oncology

## 2021-09-28 DIAGNOSIS — D696 Thrombocytopenia, unspecified: Secondary | ICD-10-CM | POA: Insufficient documentation

## 2021-09-28 DIAGNOSIS — D509 Iron deficiency anemia, unspecified: Secondary | ICD-10-CM

## 2021-09-28 LAB — CBC WITH DIFFERENTIAL/PLATELET
Abs Immature Granulocytes: 0.02 10*3/uL (ref 0.00–0.07)
Basophils Absolute: 0 10*3/uL (ref 0.0–0.1)
Basophils Relative: 1 %
Eosinophils Absolute: 0.1 10*3/uL (ref 0.0–0.5)
Eosinophils Relative: 2 %
HCT: 38.6 % (ref 36.0–46.0)
Hemoglobin: 12.8 g/dL (ref 12.0–15.0)
Immature Granulocytes: 0 %
Lymphocytes Relative: 25 %
Lymphs Abs: 1.5 10*3/uL (ref 0.7–4.0)
MCH: 31.3 pg (ref 26.0–34.0)
MCHC: 33.2 g/dL (ref 30.0–36.0)
MCV: 94.4 fL (ref 80.0–100.0)
Monocytes Absolute: 0.6 10*3/uL (ref 0.1–1.0)
Monocytes Relative: 9 %
Neutro Abs: 4 10*3/uL (ref 1.7–7.7)
Neutrophils Relative %: 63 %
Platelets: 61 10*3/uL — ABNORMAL LOW (ref 150–400)
RBC: 4.09 MIL/uL (ref 3.87–5.11)
RDW: 12.6 % (ref 11.5–15.5)
WBC: 6.2 10*3/uL (ref 4.0–10.5)
nRBC: 0 % (ref 0.0–0.2)

## 2021-10-15 IMAGING — MG DIGITAL DIAGNOSTIC BILAT W/ TOMO W/ CAD
8 series · 8 of 24 positions shown · non-contrast
Comparison: Previous exam(s).

CLINICAL DATA: Patient for follow-up of probably benign right
breast mass.

EXAM:
DIGITAL DIAGNOSTIC BILATERAL MAMMOGRAM WITH CAD AND TOMO
ULTRASOUND RIGHT BREAST

[R CC synth-2D]
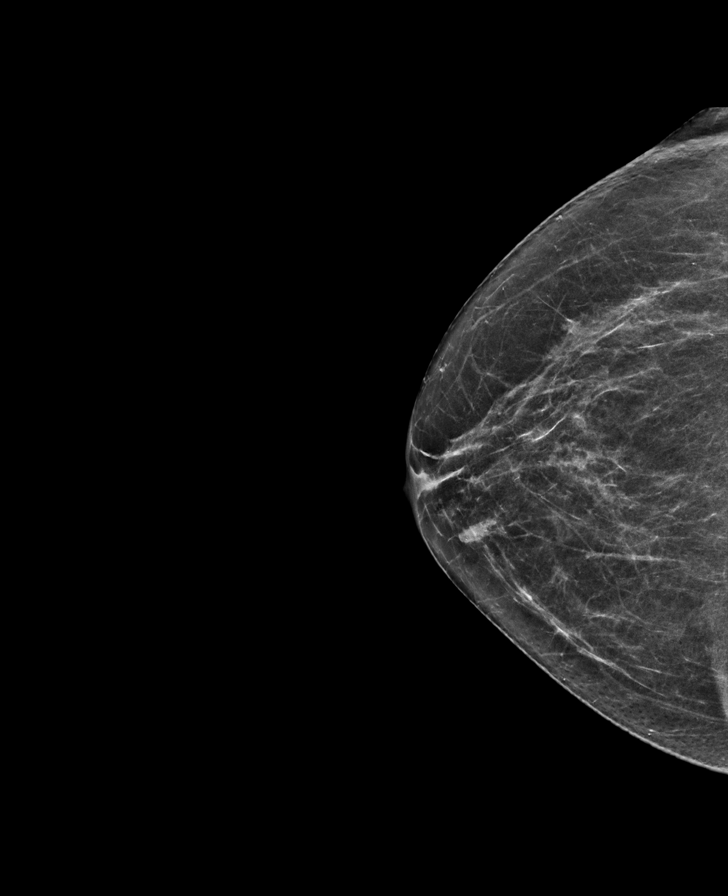

[L CC synth-2D]
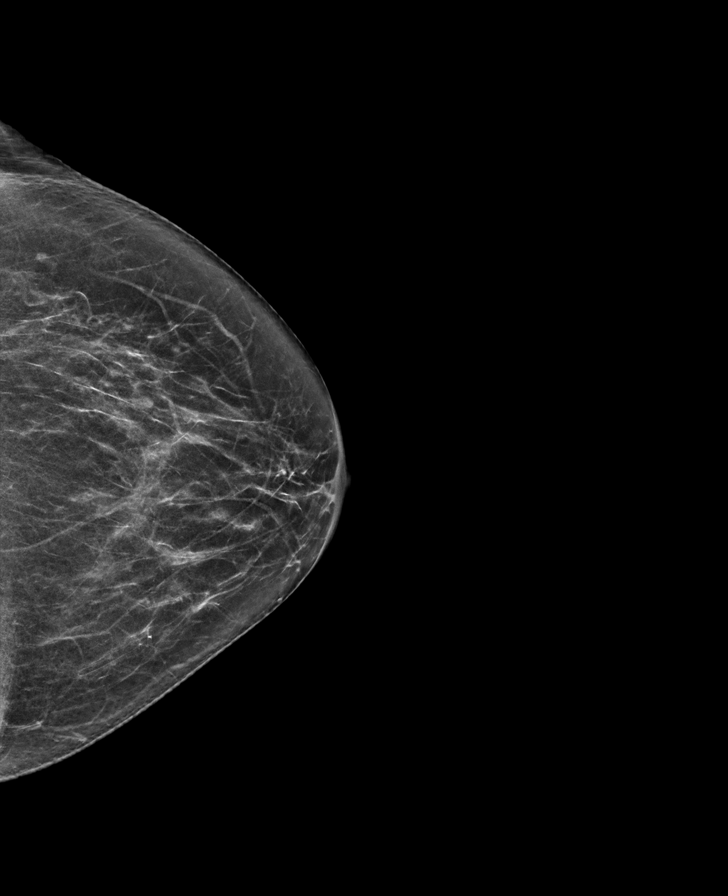

[L MLO synth-2D]
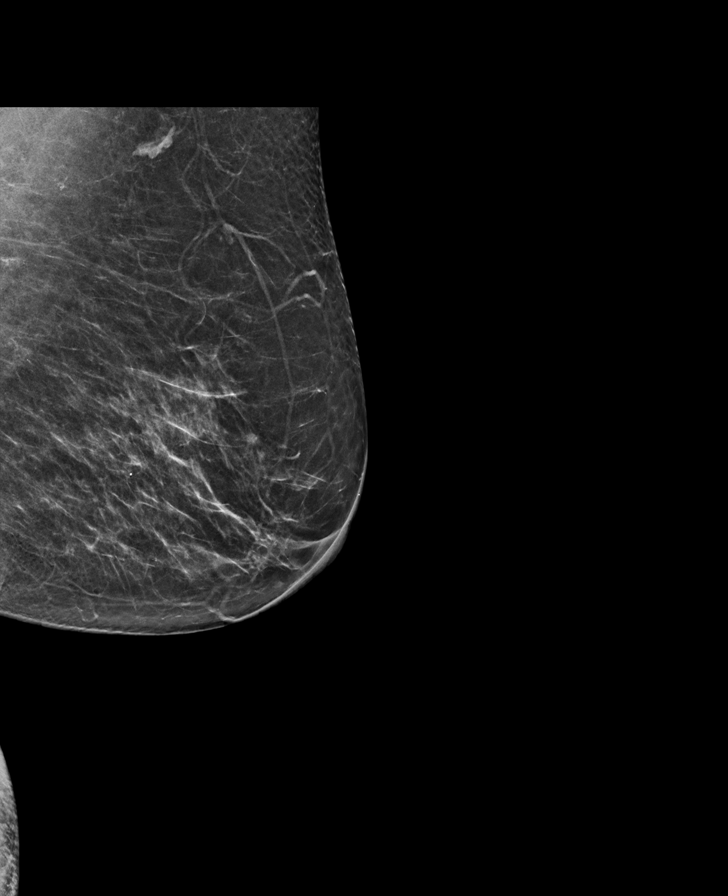

[R MLO synth-2D]
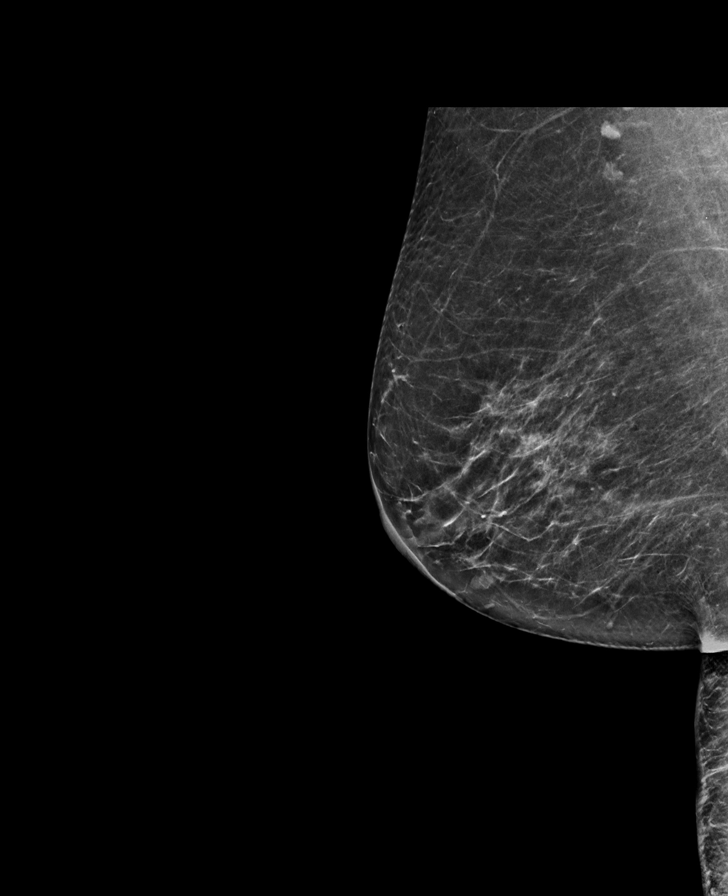

[L CC tomo · tomo slice 31/60.0]
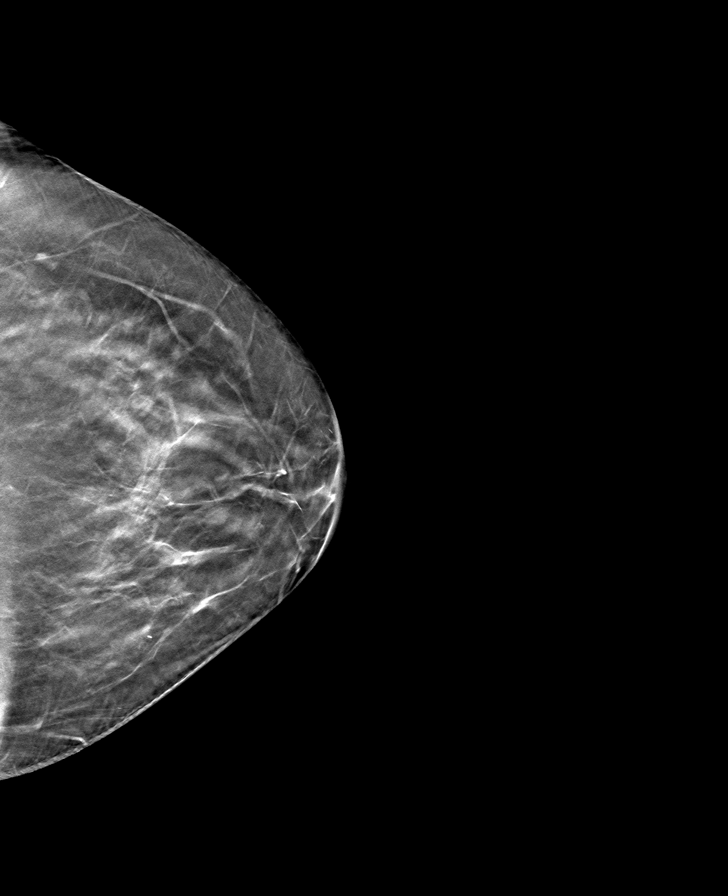

[R CC tomo · tomo slice 30/59.0]
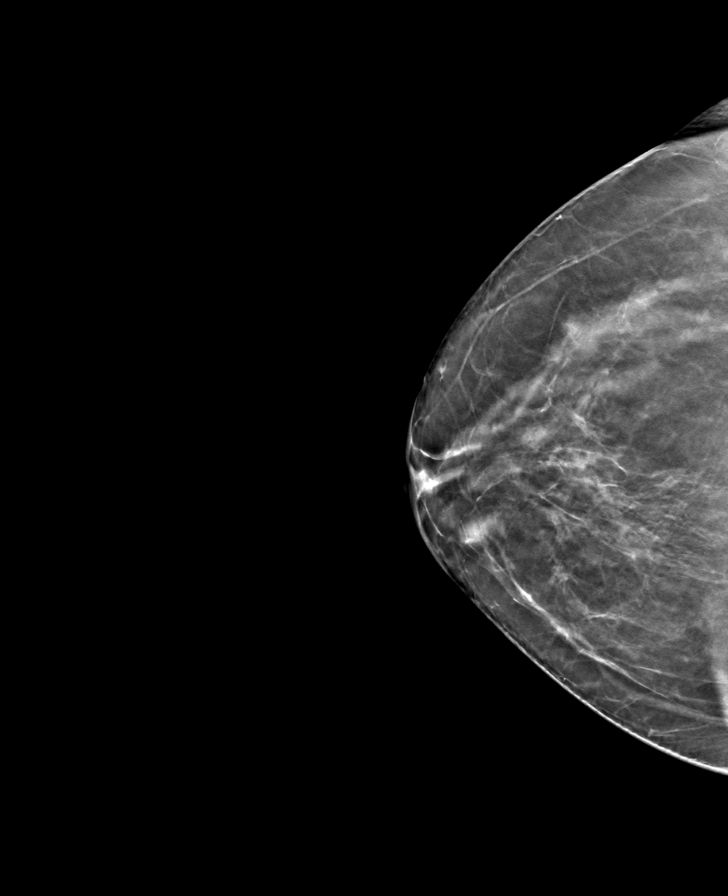

[L MLO tomo · tomo slice 33/64.0]
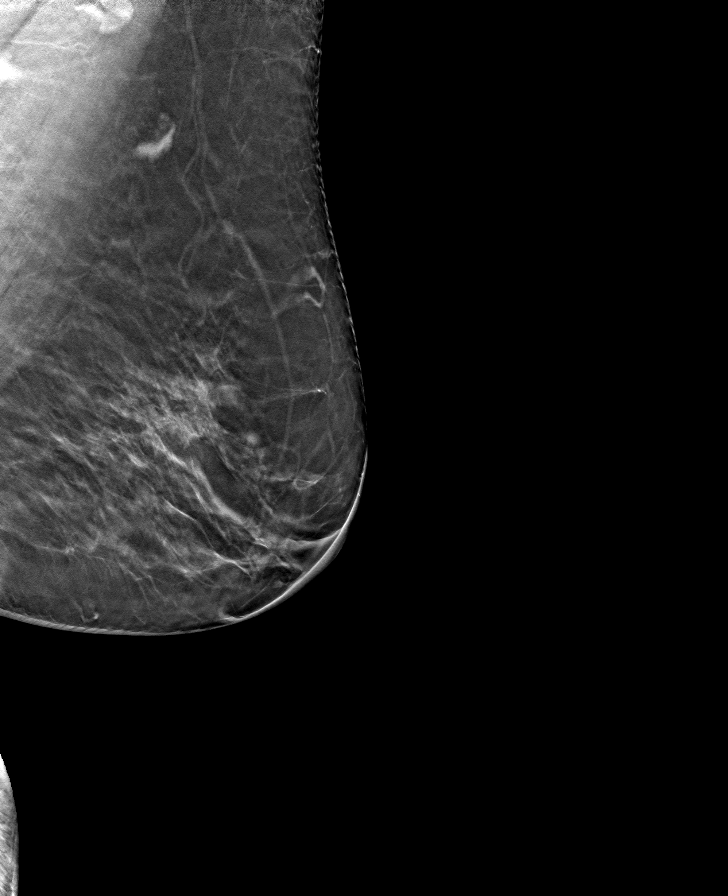

[R MLO tomo · tomo slice 33/65.0]
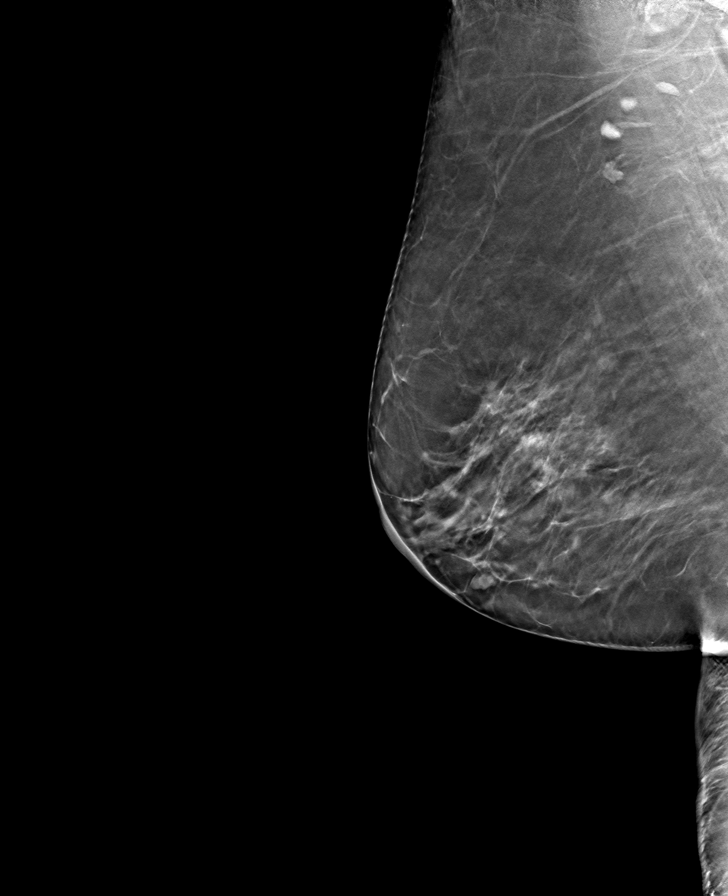

[8 of 24 positions shown; findings below may reference images not displayed]

ACR Breast Density Category b: There are scattered areas of
fibroglandular density.
FINDINGS: Redemonstrated lobular mass within the lower inner right breast
anterior depth. No new masses, calcifications or nonsurgical
distortion identified within either breast.

Mammographic images were processed with CAD.

Targeted ultrasound is performed, showing a 7 x 3 x 4 mm cyst right
breast 4 o'clock position 1 cm from the nipple. While this is mildly
increased in size when compared to prior exam, it appears as a
simple cyst on ultrasound.
IMPRESSION: No mammographic evidence for malignancy.  Benign right breast mass.

RECOMMENDATION:
Screening mammogram in one year.(Code:[id number])

I have discussed the findings and recommendations with the patient.
If applicable, a reminder letter will be sent to the patient
regarding the next appointment.

BI-RADS CATEGORY  2: Benign.

## 2021-10-20 ENCOUNTER — Ambulatory Visit: Payer: Medicare PPO | Admitting: Adult Health

## 2021-10-20 ENCOUNTER — Encounter: Payer: Self-pay | Admitting: Nurse Practitioner

## 2021-10-20 ENCOUNTER — Ambulatory Visit: Payer: Medicare PPO | Admitting: Nurse Practitioner

## 2021-10-20 VITALS — BP 84/67 | HR 69 | Temp 96.9°F | Wt 147.6 lb

## 2021-10-20 DIAGNOSIS — N1832 Chronic kidney disease, stage 3b: Secondary | ICD-10-CM | POA: Diagnosis not present

## 2021-10-20 DIAGNOSIS — E782 Mixed hyperlipidemia: Secondary | ICD-10-CM

## 2021-10-20 DIAGNOSIS — E559 Vitamin D deficiency, unspecified: Secondary | ICD-10-CM

## 2021-10-20 DIAGNOSIS — R0989 Other specified symptoms and signs involving the circulatory and respiratory systems: Secondary | ICD-10-CM | POA: Diagnosis not present

## 2021-10-20 DIAGNOSIS — Z79899 Other long term (current) drug therapy: Secondary | ICD-10-CM | POA: Diagnosis not present

## 2021-10-20 DIAGNOSIS — E039 Hypothyroidism, unspecified: Secondary | ICD-10-CM

## 2021-10-20 DIAGNOSIS — Z0000 Encounter for general adult medical examination without abnormal findings: Secondary | ICD-10-CM | POA: Diagnosis not present

## 2021-10-20 DIAGNOSIS — D696 Thrombocytopenia, unspecified: Secondary | ICD-10-CM | POA: Diagnosis not present

## 2021-10-20 DIAGNOSIS — E663 Overweight: Secondary | ICD-10-CM | POA: Diagnosis not present

## 2021-10-20 DIAGNOSIS — M1990 Unspecified osteoarthritis, unspecified site: Secondary | ICD-10-CM

## 2021-10-20 DIAGNOSIS — D500 Iron deficiency anemia secondary to blood loss (chronic): Secondary | ICD-10-CM | POA: Diagnosis not present

## 2021-10-20 DIAGNOSIS — I480 Paroxysmal atrial fibrillation: Secondary | ICD-10-CM

## 2021-10-20 DIAGNOSIS — K2100 Gastro-esophageal reflux disease with esophagitis, without bleeding: Secondary | ICD-10-CM

## 2021-10-20 DIAGNOSIS — R350 Frequency of micturition: Secondary | ICD-10-CM | POA: Diagnosis not present

## 2021-10-20 NOTE — Patient Instructions (Addendum)
DUE TO COVID-19 ONLY TWO VISITORS  (aged 78 and older)  ARE ALLOWED TO COME WITH YOU AND STAY IN THE WAITING ROOM ONLY DURING PRE OP AND PROCEDURE.   **NO VISITORS ARE ALLOWED IN THE SHORT STAY AREA OR RECOVERY ROOM!!**    Your procedure is scheduled on: 10/24/21   Report to [facility] Main Entrance    Report to admitting at 7:00 AM   Call this number if you have problems the morning of surgery [phone]   Do not eat food :After Midnight.   After Midnight you may have the following liquids until 6:15 AM DAY OF SURGERY  Water Black Coffee (sugar ok, NO MILK/CREAM OR CREAMERS)  Tea (sugar ok, NO MILK/CREAM OR CREAMERS) regular and decaf                             Plain Jell-O (NO RED)                                           Fruit ices (not with fruit pulp, NO RED)                                     Popsicles (NO RED)                                                                  Juice: apple, WHITE grape, WHITE cranberry Sports drinks like Gatorade (NO RED) Clear broth(vegetable,chicken,beef)  FOLLOW BOWEL PREP AND ANY ADDITIONAL PRE OP INSTRUCTIONS YOU RECEIVED FROM YOUR SURGEON'S OFFICE!!!     Oral Hygiene is also important to reduce your risk of infection.                                    Remember - BRUSH YOUR TEETH THE MORNING OF SURGERY WITH YOUR REGULAR TOOTHPASTE   Take these medicines the morning of surgery with A SIP OF WATER: Tylenol, Zyrtec, Gabapentin, Levothyroxine, Pantoprazole   DO NOT TAKE ANY ORAL DIABETIC MEDICATIONS DAY OF YOUR SURGERY How to Manage Your Diabetes Before and After Surgery  Why is it important to control my blood sugar before and after surgery? Improving blood sugar levels before and after surgery helps healing and can limit problems. A way of improving blood sugar control is eating a healthy diet by:  Eating less sugar and carbohydrates  Increasing activity/exercise  Talking with your doctor about reaching your blood sugar  goals High blood sugars (greater than 180 mg/dL) can raise your risk of infections and slow your recovery, so you will need to focus on controlling your diabetes during the weeks before surgery. Make sure that the doctor who takes care of your diabetes knows about your planned surgery including the date and location.  How do I manage my blood sugar before surgery? Check your blood sugar at least 4 times a day, starting 2 days before surgery, to make sure that the level is not too high  or low. Check your blood sugar the morning of your surgery when you wake up and every 2 hours until you get to the Short Stay unit. If your blood sugar is less than 70 mg/dL, you will need to treat for low blood sugar: Do not take insulin. Treat a low blood sugar (less than 70 mg/dL) with  cup of clear juice (cranberry or apple), 4 glucose tablets, OR glucose gel. Recheck blood sugar in 15 minutes after treatment (to make sure it is greater than 70 mg/dL). If your blood sugar is not greater than 70 mg/dL on recheck, call [phone] for further instructions. Report your blood sugar to the short stay nurse when you get to Short Stay.  If you are admitted to the hospital after surgery: Your blood sugar will be checked by the staff and you will probably be given insulin after surgery (instead of oral diabetes medicines) to make sure you have good blood sugar levels. The goal for blood sugar control after surgery is 80-180 mg/dL.  Reviewed and Endorsed by [facility] Patient Education Committee, August 2015                               You may not have any metal on your body including hair pins, jewelry, and body piercing             Do not wear make-up, lotions, powders, perfumes, or deodorant  Do not wear nail polish including gel and S&S, artificial/acrylic nails, or any other type of covering on natural nails including finger and toenails. If you have artificial nails, gel coating, etc. that needs to be  removed by a nail salon please have this removed prior to surgery or surgery may need to be canceled/ delayed if the surgeon/ anesthesia feels like they are unable to be safely monitored.   Do not shave  48 hours prior to surgery.    Do not bring valuables to the hospital. Jillian Rodgers.   Contacts, dentures or bridgework may not be worn into surgery.  DO NOT [place]. PHARMACY WILL DISPENSE MEDICATIONS LISTED ON YOUR MEDICATION LIST TO YOU DURING YOUR ADMISSION [place]!    Patients discharged on the day of surgery will not be allowed to drive home.  Someone NEEDS to stay with you for the first 24 hours after anesthesia.   Special Instructions: Bring a copy of your healthcare power of attorney and living will documents         the day of surgery if you haven't scanned them before.              Please read over the following fact sheets you were given: IF YOU HAVE QUESTIONS ABOUT YOUR PRE-OP INSTRUCTIONS PLEASE CALL [place] - Preparing for Surgery Before surgery, you can play an important role.  Because skin is not sterile, your skin needs to be as free of germs as possible.  You can reduce the number of germs on your skin by washing with CHG (chlorahexidine gluconate) soap before surgery.  CHG is an antiseptic cleaner which kills germs and bonds with the skin to continue killing germs even after washing. Please DO NOT use if you have an allergy to CHG or antibacterial soaps.  If  your skin becomes reddened/irritated stop using the CHG and inform your nurse when you arrive at Short Stay. Do not shave (including legs and underarms) for at least 48 hours prior to the first CHG shower.  You may shave your face/neck.  Please follow these instructions carefully:  1.  Shower with CHG Soap the night before surgery and the  morning of surgery.  2.  If you choose to wash your hair, wash  your hair first as usual with your normal  shampoo.  3.  After you shampoo, rinse your hair and body thoroughly to remove the shampoo.                             4.  Use CHG as you would any other liquid soap.  You can apply chg directly to the skin and wash.  Gently with a scrungie or clean washcloth.  5.  Apply the CHG Soap to your body ONLY FROM THE NECK DOWN.   Do   not use on face/ open                           Wound or open sores. Avoid contact with eyes, ears mouth and   genitals (private parts).                       Wash face,  Genitals (private parts) with your normal soap.             6.  Wash thoroughly, paying special attention to the area where your    surgery  will be performed.  7.  Thoroughly rinse your body with warm water from the neck down.  8.  DO NOT shower/wash with your normal soap after using and rinsing off the CHG Soap.                9.  Pat yourself dry with a clean towel.            10.  Wear clean pajamas.            11.  Place clean sheets on your bed the night of your first shower and do not  sleep with pets. Day of Surgery : Do not apply any lotions/deodorants the morning of surgery.  Please wear clean clothes to the hospital/surgery center.  FAILURE TO FOLLOW THESE INSTRUCTIONS MAY RESULT IN THE CANCELLATION OF YOUR SURGERY  PATIENT SIGNATURE_________________________________  NURSE SIGNATURE__________________________________  ________________________________________________________________________

## 2021-10-20 NOTE — Progress Notes (Signed)
MEDICARE ANNUAL WELLNESS VISIT AND FOLLOW UP  Assessment:   Diagnoses and all orders for this visit:  1. Encounter for Medicare annual wellness exam Due annually  2. Paroxysmal atrial fibrillation (HCC) No change and currently controlled. Continue to follow with Cardiology. Continue to monitor   3. Labile hypertension Very low today. Asymptomatic. Contribute to Dehydration.   Discussed the importance of staying well hydrated. At least 2L daily. Take BP in the home and contact office BP consistently <90/60. She will follow up with pre-op tomorrow 10/21/21.  - CBC with Differential/Platelet - COMPLETE METABOLIC PANEL WITH GFR  4. Iron deficiency anemia due to chronic blood loss Continue to follow up with Hematology  - CBC with Differential/Platelet  5. Thrombocytopenia (HCC)  - CBC with Differential/Platelet  6. Stage 3b chronic kidney disease ([place]) Discussed how what you eat and drink can aide in kidney protection. Stay well hydrated. Avoid high salt foods. Avoid NSAIDS. Keep BP and BG well controlled.   Take medications as prescribed. Remain active and exercise as tolerated daily. Maintain weight.  Continue to monitor.  - COMPLETE METABOLIC PANEL WITH GFR  7. Gastroesophageal reflux disease with esophagitis without hemorrhage Controlled. Continue to monitor   8. Hypothyroidism, unspecified type Controlled. Continue Levothyroxine. Reminded to take on an empty stomach 30-55mns before food.  Stop any Biotin Supplement 48-72 hours before next TSH level to reduce the risk of falsely low TSH levels. Continue to monitor.    - TSH  9. Osteoarthritis, unspecified osteoarthritis type, unspecified site Controlled. RICE when flared. Continue to monitor   10. Mixed hyperlipidemia Controlled Discussed lifestyle modifications. Recommended diet heavy in fruits and veggies, omega 3's. Decrease consumption of animal meats, cheeses, and dairy products. Remain  active and exercise as tolerated. Continue to monitor.  - Lipid panel  11. Vitamin D deficiency At goal. Continue to monitor   12. Overweight (BMI 25.0-29.9) Discussed appropriate BMI Goal of losing 1 lb per month. Diet modification. Physical activity. Encouraged/praised to build confidence.  - Hemoglobin A1c  13. Increased urinary frequency  - Urinalysis w microscopic + reflex cultur  14. Medication management All medications discussed and reviewed in full. All questions and concerns regarding medications addressed.    - CBC with Differential/Platelet - COMPLETE METABOLIC PANEL WITH GFR - Lipid panel - TSH - Hemoglobin A1c - VITAMIN D 25 Hydroxy (Vit-D Deficiency, Fractures)  Over 40 minutes of exam, counseling, chart review and critical decision making was performed Future Appointments  Date Time Provider [place] 10/21/2021 10:00 AM [facility] PAT 4 [facility] None  12/29/2021 11:30 AM IBenay Pike MD [facility] None  02/01/2022  2:30 PM MUnk Pinto MD [facility] None  07/25/2022  2:00 PM MUnk Pinto MD [facility] None     Plan:   During the course of the visit the patient was educated and counseled about appropriate screening and preventive services including:   Pneumococcal vaccine  Prevnar 13 Influenza vaccine Td vaccine Screening electrocardiogram Bone densitometry screening Colorectal cancer screening Diabetes screening Glaucoma screening Nutrition counseling  Advanced directives: requested   Subjective:  Jillian ANGELICAis a 76y.o. female who presents for Medicare Annual Wellness Visit and 3 month follow up.   She plans to go for an open right inguinal hernia repair with Dr. CAnnamaria BootsSurgery, 10/24/21.  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 She was prescribed fosamax but prefers not to start unless absolutely necessary due to SE  risk.   Patient has  GERD, failed trial of transition of Protonix to Ranitidine alone and currently is controlled on Protonix/am. She underwent EGD 03/2018 which showed gastritis without evidence of dysplasia, malignancy or h. Pylori.  She has R hammer toe surgery Sept 9th 2021 with Dr. Geroge Baseman, still some trouble but improved.   She is followed by neurology for headaches/migraines and getting botox shots, much improved.   She is followed by [facility]Hematology for IDA. Next apt 12/2021    She also follows with Cardiollgy for paroxysmal atrial fib, not on anticoagulation d/t h/o thrombocytopenia, recentl seen on 08/31/21 via telehealth.  She continues to be stable.  BMI is Body mass index is 24.56 kg/m., she has not been working on diet and exercise, geneally active around home but not oing to gym since covid 19.  Wt Readings from Last 3 Encounters:  10/20/21 147 lb 9.6 oz (67 kg)  07/18/21 142 lb (64.4 kg)  07/05/21 142 lb (64.4 kg)   Was having frequent near syncopal episodes, reports resolved spontaneously and none this last year   Her blood pressure has been controlled at home, but low in clinic today.  She is asymptomatic.  Today their BP is BP: (!) 84/67 She does not workout. She denies chest pain, shortness of breath, dizziness.  She is not staying well hydrated.   She is on cholesterol medication (pravastatin 40 mg daily) and denies myalgias. Her cholesterol is not at goal. The cholesterol last visit was:   Lab Results  Component Value Date   CHOL 149 07/18/2021   HDL 45 (L) 07/18/2021   LDLCALC 75 07/18/2021   TRIG 197 (H) 07/18/2021   CHOLHDL 3.3 07/18/2021    She has not been working on diet and exercise for glucose management, and denies foot ulcerations, increased appetite, nausea, paresthesia of the feet, polydipsia, polyuria, visual disturbances, vomiting and weight loss. Last A1C in the office was:  Lab Results  Component Value Date   HGBA1C 5.7 (H) 07/18/2021   She is on thyroid  medication. Her medication was not changed last visit.  50 mcg daily Lab Results  Component Value Date   TSH 1.40 07/18/2021   She is followed by Dr. Corliss Parish. Follows for renal cysts, stable on recent MRI. Also CKD III. Last GFR: Lab Results  Component Value Date   GFRNONAA 52 (L) 06/30/2021   GFRNONAA 48 (L) 03/08/2021   GFRNONAA 54 (L) 10/25/2020   Patient is on Vitamin D supplement and at goal at recent check:    Lab Results  Component Value Date   VD25OH 99 07/18/2021      She has persistent stable thrombocytopenia for many years, recently working up by hematology Dr. Chryl Heck, had bone marrow biopsy but unremarkable.     Latest Ref Rng & Units 09/28/2021   11:06 AM 07/18/2021    3:32 PM 06/30/2021   11:29 AM  CBC  WBC 4.0 - 10.5 K/uL 6.2  8.4  9.4   Hemoglobin 12.0 - 15.0 g/dL 12.8  12.0  13.0   Hematocrit 36.0 - 46.0 % 38.6  36.6  38.2   Platelets 150 - 400 K/uL 61  53  61    She had iron def anemia following surgery which has resolved.  Lab Results  Component Value Date   IRON 93 07/07/2020   TIBC 253 07/07/2020   FERRITIN 240 07/07/2020      Medication Review: Current Outpatient Medications on File Prior to Visit  Medication  Sig Dispense Refill   acetaminophen (TYLENOL) 325 MG tablet Take 650 mg by mouth every 6 (six) hours as needed for moderate pain or headache.     Ascorbic Acid (VITAMIN C) 500 MG CAPS Take 500 mg by mouth daily.      BOTOX 100 units SOLR injection Inject 100 Units into the muscle See admin instructions. Inject 100 units intramuscularly every 6-8 weeks     cetirizine (ZYRTEC) 10 MG tablet Take 10 mg by mouth daily.     Cholecalciferol (VITAMIN D) 125 MCG (5000 UT) CAPS Take 5,000 Units by mouth daily.     gabapentin (NEURONTIN) 600 MG tablet Take 600 mg by mouth See admin instructions. Take 600 mg by mouth in morning and 1200 mg at bedtime     levothyroxine (SYNTHROID) 50 MCG tablet TAKE 1 TABLET BY MOUTH DAILY ON A EMPTY STOMACH WITH  WATER. DO NOT TAKE ANY ANTACID, CALCIUM OR MAGNESIUM FOR 4 HOURS. AVOID BIOTIN 90 tablet 3   pantoprazole (PROTONIX) 40 MG tablet Take  1 tablet  Daily  for Acid Indigestion & Heartburn 90 tablet 3   Polyethyl Glycol-Propyl Glycol (SYSTANE ULTRA) 0.4-0.3 % SOLN Place 1 drop into both eyes 3 (three) times daily as needed (dry eyes).     pravastatin (PRAVACHOL) 40 MG tablet TAKE 1 TABLET BY MOUTH AT BEDTIME FOR CHOLESTEROL 90 tablet 3   Propylene Glycol (SYSTANE BALANCE) 0.6 % SOLN Place 1 drop into both eyes at bedtime.     zinc gluconate 50 MG tablet Take 50 mg by mouth daily.     No current facility-administered medications on file prior to visit.    No Known Allergies  Current Problems (verified) Patient Active Problem List   Diagnosis Date Noted   Snoring 03/04/2021   Paroxysmal atrial fibrillation ([place]) 11/01/2020   Secondary hypercoagulable state ([place]) 11/01/2020   Increased urinary frequency 10/20/2020   Localized osteoporosis without current pathological fracture 10/20/2020   IDA (iron deficiency anemia) 04/07/2020   Thrombocytopenia ([place]) 11/25/2015   Encounter for Medicare annual wellness exam 06/14/2015   Overweight (BMI 25.0-29.9) 01/28/2015   CKD (chronic kidney disease) stage 3, GFR 30-59 ml/min (HCC) 01/19/2014   Upper airway cough syndrome 12/16/2013   Vitamin D deficiency 05/21/2013   Medication management 05/21/2013   Labile hypertension 01/31/2013   Degenerative joint disease    GERD (gastroesophageal reflux disease)    Hyperlipidemia    Hypothyroidism     Screening Tests Immunization History  Administered Date(s) Administered   Influenza Inj Mdck Quad With Preservative 01/15/2018   Influenza, High Dose Seasonal PF 12/29/2013, 01/15/2015, 02/01/2016, 02/28/2017, 02/07/2018, 12/30/2018, 02/12/2020, 02/01/2021   Influenza-Unspecified 01/24/2011, 02/02/2012, 01/16/2015   PFIZER(Purple Top)SARS-COV-2 Vaccination 05/12/2019, 06/02/2019, 01/22/2020, 07/23/2020    Pneumococcal Conjugate-13 10/28/2014   Pneumococcal-Unspecified 11/28/2011   Td 11/17/2010   Tdap 11/12/2014   Zoster, Live 11/17/2010    Preventative care: Last colonoscopy: 2009 Last cologuard: DUE 02/2021 - negative Due 2025 Last EGD: 03/2018  Last mammogram: 04/2021 - does at GYN - Dr. Rogue Bussing, has abnormal R cluster of cysts stable - resume annual screening mammograms DEXA:03/2016 L fem T -1.6, had in 06/2020, osteoporosis limited to L forearm, T -3.2, hip stable/unchanged from 2017 at T - 1.6, patient preference to defer fosamax, will recheck in 2 years. Due 2024  Prior vaccinations: TD or Tdap: 2016  Influenza: 12/2019 Pneumococcal: 2013 Prevnar13: 2016 Shingles/Zostavax: 2012 - check with insurance about shingrix  Covid 19: 2/2, 2021, pfizer + 2 boosters  Names  of Other Physician/Practitioners you currently use: 1. [facility]- here for primary care 2. Dr. Gershon Crane, eye doctor, last visit 2021, has upcoming scheduled Nov 3. Dr. Mathis Fare dentist, last visit 2022, goes q74m Patient Care Team: MUnk Pinto MD as PCP - General (Internal Medicine) SBelva Crome MD as PCP - Cardiology (Cardiology) GCorliss Parish [name]  SURGICAL HISTORY She  has a past surgical history that includes Ventral hernia repair; Cholecystectomy (1996); Pilonidal cyst excision (1972); Tonsillectomy; Upper gi endoscopy; Colonoscopy (2009); Hernia repair (1985); Hernia repair (2005 & 2009); SKIN CANCER REMOVAL ; BILATERAL CATARACT SURGERY ; BLERPHOROPLASTY \; and Inguinal hernia repair (Left, 04/30/2020). FAMILY HISTORY Her family history includes Breast cancer in her sister; Cancer in her mother; Congestive Heart Failure in her father; Gout in her maternal aunt; Heart disease in her mother; Hypertension in her mother; Ovarian cancer in her mother; Stroke in her paternal uncle; Uterine cancer in her mother. SOCIAL  HISTORY She  reports that she has never smoked. She has never used smokeless tobacco. She reports that she does not drink alcohol and does not use drugs.   MEDICARE WELLNESS OBJECTIVES: Physical activity: Exercise limited by: orthopedic condition(s);Other - see comments (anemia) Cardiac risk factors: Cardiac Risk Factors include: advanced age (>538m, >6>34omen);dyslipidemia;sedentary lifestyle Depression/mood screen:      10/20/2021    4:16 PM  Depression screen PHQ 2/9  Decreased Interest 0  Down, Depressed, Hopeless 0  PHQ - 2 Score 0    ADLs:     10/20/2021    4:16 PM 01/31/2021   11:30 PM  In your present state of health, do you have any difficulty performing the following activities:  Hearing? 0 0  Vision? 0 0  Difficulty concentrating or making decisions? 0 0  Walking or climbing stairs? 0 0  Dressing or bathing? 0 0  Doing errands, shopping? 0 0  Preparing Food and eating ? N   Using the Toilet? N   In the past six months, have you accidently leaked urine? N   Do you have problems with loss of bowel control? N   Managing your Medications? N   Managing your Finances? N   Housekeeping or managing your Housekeeping? N      Cognitive Testing  Alert? Yes  Normal Appearance?Yes  Oriented to person? Yes  Place? Yes   Time? Yes  Recall of three objects?  Yes  Can perform simple calculations? Yes  Displays appropriate judgment?Yes  Can read the correct time from a watch face?Yes  EOL planning: Does Patient Have a Medical Advance Directive?: No Would patient like information on creating a medical advance directive?: No - Patient declined     Review of Systems  Constitutional:  Negative for malaise/fatigue and weight loss.  HENT:  Negative for hearing loss and tinnitus.   Eyes:  Negative for blurred vision and double vision.  Respiratory:  Negative for cough, sputum production, shortness of breath and wheezing.   Cardiovascular:  Negative for chest pain, palpitations,  orthopnea, claudication, leg swelling and PND.  Gastrointestinal:  Negative for abdominal pain, blood in stool, constipation, diarrhea, heartburn, melena, nausea and vomiting.  Genitourinary:  Positive for frequency and urgency. Negative for dysuria, flank pain and hematuria.  Musculoskeletal:  Negative for falls, joint pain and myalgias.  Skin:  Negative for rash.  Neurological:  Positive for headaches (chronic, not new, getting botox injections). Negative for dizziness, tingling, sensory change, loss of consciousness  and weakness.  Endo/Heme/Allergies:  Negative for polydipsia.  Psychiatric/Behavioral: Negative.  Negative for depression, memory loss, substance abuse and suicidal ideas. The patient is not nervous/anxious and does not have insomnia.   All other systems reviewed and are negative.    Objective:     Today's Vitals   10/20/21 1458  BP: (!) 84/67  Pulse: 69  Temp: (!) 96.9 F (36.1 C)  SpO2: 97%  Weight: 147 lb 9.6 oz (67 kg)   Body mass index is 24.56 kg/m.  General appearance: alert, no distress, WD/WN,  female HEENT: normocephalic, sclerae anicteric, TMs pearly, nares patent, no discharge or erythema, pharynx normal Oral cavity: MMM, no lesions Neck: supple, no lymphadenopathy, no thyromegaly, no masses Heart: RRR, normal S1, S2, no murmurs Lungs: CTA bilaterally, no wheezes, rhonchi, or rales Abdomen: +bs, soft, LLQ/groin tenderness with easily reduced inguinal hernia, non distended, no masses, no hepatomegaly, no splenomegaly Musculoskeletal: nontender, no swelling, no obvious deformity Extremities: no edema, no cyanosis, no clubbing Pulses: 2+ symmetric, upper and lower extremities, normal cap refill Neurological: alert, oriented x 3, CN2-12 intact, strength normal upper extremities and lower extremities, sensation normal throughout, DTRs 2+ throughout, no cerebellar signs, gait normal Psychiatric: normal affect, behavior normal, pleasant    Medicare  Attestation I have personally reviewed: The patient's medical and social history Their use of alcohol, tobacco or illicit drugs Their current medications and supplements The patient's functional ability including ADLs,fall risks, home safety risks, cognitive, and hearing and visual impairment Diet and physical activities Evidence for depression or mood disorders  The patient's weight, height, BMI, and visual acuity have been recorded in the chart.  I have made referrals, counseling, and provided education to the patient based on review of the above and I have provided the patient with a written personalized care plan for preventive services.     Darrol Jump, NP   10/20/2021

## 2021-10-20 NOTE — Patient Instructions (Signed)

## 2021-10-20 NOTE — Progress Notes (Addendum)
COVID Vaccine Completed: yes x4  Date of COVID positive in last 90 days: no  PCP - Unk Pinto, MD Cardiologist - Daneen Schick, MD  Cardiac clearance by Diona Browner 08/31/21 in Epic  Chest x-ray - 07/05/21 Epic EKG - 07/18/21 Epic Stress Test - yes more than 10 years ago per pt ECHO - 12/14/20 Epic Cardiac Cath - n/a Pacemaker/ICD device last checked: n/a Spinal Cord Stimulator: n/a  Bowel Prep - no  Sleep Study - yes, inconclusive  CPAP - no  Fasting Blood Sugar - pre DM, no checks at home Checks Blood Sugar _____ times a day  Blood Thinner Instructions: n/a Aspirin Instructions: Last Dose:  Activity level: Can go up a flight of stairs and perform activities of daily living without stopping and without symptoms of chest pain or shortness of breath.  Anesthesia review: HTN, a fib, CKD, OSA, thrombocytopenia  Patient denies shortness of breath, fever, cough and chest pain at PAT appointment  Patient verbalized understanding of instructions that were given to them at the PAT appointment. Patient was also instructed that they will need to review over the PAT instructions again at home before surgery.

## 2021-10-21 ENCOUNTER — Encounter (HOSPITAL_COMMUNITY): Payer: Self-pay

## 2021-10-21 ENCOUNTER — Encounter (HOSPITAL_COMMUNITY)
Admission: RE | Admit: 2021-10-21 | Discharge: 2021-10-21 | Disposition: A | Discharge status: Home / Self Care | Payer: Medicare PPO | Source: Ambulatory Visit | Attending: Surgery | Admitting: Surgery

## 2021-10-21 VITALS — BP 146/90 | HR 65 | Temp 98.6°F | Resp 14 | Ht 64.0 in | Wt 143.8 lb

## 2021-10-21 DIAGNOSIS — I10 Essential (primary) hypertension: Secondary | ICD-10-CM | POA: Insufficient documentation

## 2021-10-21 DIAGNOSIS — K219 Gastro-esophageal reflux disease without esophagitis: Secondary | ICD-10-CM | POA: Diagnosis not present

## 2021-10-21 DIAGNOSIS — N189 Chronic kidney disease, unspecified: Secondary | ICD-10-CM | POA: Insufficient documentation

## 2021-10-21 DIAGNOSIS — K4090 Unilateral inguinal hernia, without obstruction or gangrene, not specified as recurrent: Secondary | ICD-10-CM | POA: Insufficient documentation

## 2021-10-21 DIAGNOSIS — R7303 Prediabetes: Secondary | ICD-10-CM | POA: Insufficient documentation

## 2021-10-21 DIAGNOSIS — Z01812 Encounter for preprocedural laboratory examination: Secondary | ICD-10-CM | POA: Insufficient documentation

## 2021-10-21 DIAGNOSIS — I480 Paroxysmal atrial fibrillation: Secondary | ICD-10-CM | POA: Diagnosis not present

## 2021-10-21 HISTORY — DX: Cardiac arrhythmia, unspecified: I49.9

## 2021-10-21 LAB — CBC WITH DIFFERENTIAL/PLATELET
Absolute Monocytes: 672 cells/uL (ref 200–950)
Basophils Absolute: 32 cells/uL (ref 0–200)
Basophils Relative: 0.5 %
Eosinophils Absolute: 128 cells/uL (ref 15–500)
Eosinophils Relative: 2 %
HCT: 37.8 % (ref 35.0–45.0)
Hemoglobin: 12.6 g/dL (ref 11.7–15.5)
Lymphs Abs: 1606 cells/uL (ref 850–3900)
MCH: 31.5 pg (ref 27.0–33.0)
MCHC: 33.3 g/dL (ref 32.0–36.0)
MCV: 94.5 fL (ref 80.0–100.0)
MPV: 12.4 fL (ref 7.5–12.5)
Monocytes Relative: 10.5 %
Neutro Abs: 3962 cells/uL (ref 1500–7800)
Neutrophils Relative %: 61.9 %
Platelets: 60 10*3/uL — ABNORMAL LOW (ref 140–400)
RBC: 4 10*6/uL (ref 3.80–5.10)
RDW: 12.2 % (ref 11.0–15.0)
Total Lymphocyte: 25.1 %
WBC: 6.4 10*3/uL (ref 3.8–10.8)

## 2021-10-21 LAB — COMPLETE METABOLIC PANEL WITH GFR
AG Ratio: 1.7 (calc) (ref 1.0–2.5)
ALT: 13 U/L (ref 6–29)
AST: 18 U/L (ref 10–35)
Albumin: 4 g/dL (ref 3.6–5.1)
Alkaline phosphatase (APISO): 73 U/L (ref 37–153)
BUN/Creatinine Ratio: 23 (calc) — ABNORMAL HIGH (ref 6–22)
BUN: 26 mg/dL — ABNORMAL HIGH (ref 7–25)
CO2: 26 mmol/L (ref 20–32)
Calcium: 9.9 mg/dL (ref 8.6–10.4)
Chloride: 108 mmol/L (ref 98–110)
Creat: 1.11 mg/dL — ABNORMAL HIGH (ref 0.60–1.00)
Globulin: 2.4 g/dL (calc) (ref 1.9–3.7)
Glucose, Bld: 102 mg/dL — ABNORMAL HIGH (ref 65–99)
Potassium: 4.2 mmol/L (ref 3.5–5.3)
Sodium: 142 mmol/L (ref 135–146)
Total Bilirubin: 0.5 mg/dL (ref 0.2–1.2)
Total Protein: 6.4 g/dL (ref 6.1–8.1)
eGFR: 51 mL/min/{1.73_m2} — ABNORMAL LOW (ref >=60)

## 2021-10-21 LAB — LIPID PANEL
Cholesterol: 168 mg/dL (ref <200)
HDL: 64 mg/dL (ref >=50)
LDL Cholesterol (Calc): 82 mg/dL (calc)
Non-HDL Cholesterol (Calc): 104 mg/dL (calc) (ref <130)
Total CHOL/HDL Ratio: 2.6 (calc) (ref <5.0)
Triglycerides: 123 mg/dL (ref <150)

## 2021-10-21 LAB — HEMOGLOBIN A1C
Hgb A1c MFr Bld: 5.4 % of total Hgb (ref <5.7)
Mean Plasma Glucose: 108 mg/dL
eAG (mmol/L): 6 mmol/L

## 2021-10-21 LAB — TSH: TSH: 2.06 mIU/L (ref 0.40–4.50)

## 2021-10-21 LAB — VITAMIN D 25 HYDROXY (VIT D DEFICIENCY, FRACTURES): Vit D, 25-Hydroxy: 79 ng/mL (ref 30–100)

## 2021-10-21 LAB — GLUCOSE, CAPILLARY: Glucose-Capillary: 76 mg/dL (ref 70–99)

## 2021-10-21 NOTE — Progress Notes (Signed)
Anesthesia Chart Review   Case: [id number] Date/Time: 10/24/21 0900   Procedure: OPEN RIGHT INGUINAL HERNIA REPAIR (Right)   Anesthesia type: General   Pre-op diagnosis: HERNIA   Location: [facility] ROOM 04 / [facility]   Surgeons: Clovis Riley, MD       DISCUSSION:75 y.o. never smoker with h/o GERD, HTN, a-fib, CKD, hernia scheduled for above procedure 10/24/2021 with Dr. Romana Juniper.   Pt seen by cardiology 08/31/2021 for preoperative evaluation.  Per OV note, "According to the Revised Cardiac Risk Index (RCRI), her Perioperative Risk of Major Cardiac Event is (%): 0.4. Her Functional Capacity in METs is: 7.99 according to the Duke Activity Status Index (DASI). Therefore, based on ACC/AHA guidelines, patient would be at acceptable risk for the planned procedure without further cardiovascular testing."  Anticipate pt can proceed with planned procedure barring acute status change.   VS: BP (!) 146/90   Pulse 65   Temp 37 C (Oral)   Resp 14   Ht '5\' 4"'$  (1.626 m)   Wt 65.2 kg   SpO2 100%   BMI 24.68 kg/m   PROVIDERS: Unk Pinto, MD is PCP   Cardiologist - Daneen Schick, MD LABS: Labs reviewed: Acceptable for surgery. (all labs ordered are listed, but only abnormal results are displayed)  Labs Reviewed  GLUCOSE, CAPILLARY  HEMOGLOBIN H2C  BASIC METABOLIC PANEL  CBC     IMAGES:   EKG: 07/18/21 Rate 59 bpm  NSR  CV: Echo 12/14/2020  1. Left ventricular ejection fraction, by estimation, is 60 to 65%. The  left ventricle has normal function. The left ventricle has no regional  wall motion abnormalities. Left ventricular diastolic parameters are  indeterminate.   2. Right ventricular systolic function is normal. The right ventricular  size is normal. There is normal pulmonary artery systolic pressure.   3. The mitral valve is normal in structure. Mild mitral valve  regurgitation.   4. The aortic valve is tricuspid. Aortic valve regurgitation is not  visualized. Mild  aortic valve sclerosis is present, with no evidence of  aortic valve stenosis.  Past Medical History:  Diagnosis Date   Anemia with low platelet count (HCC)    Chronic kidney disease    Degenerative joint disease    Dysrhythmia    a fib   GERD (gastroesophageal reflux disease)    History of kidney stones    Hyperlipidemia    Hypothyroidism    Inguinal hernia 02/19/2019   Labile hypertension    Migraines    neurontin helps   Pneumonia    hx of walking pneumonia     Past Surgical History:  Procedure Laterality Date   BILATERAL CATARACT SURGERY      BLERPHOROPLASTY \     CHOLECYSTECTOMY  1996   open   COLONOSCOPY  2009   recommended 10 year f/u   HERNIA REPAIR  1985   umb hernia rpr   HERNIA REPAIR  2005 & 2009   ventral hernia rpr   INGUINAL HERNIA REPAIR Left 04/30/2020   Procedure: OPEN LEFT INGUINAL HERNIA REPAIR WITH MESH;  Surgeon: Clovis Riley, MD;  Location: [facility];  Service: General;  Laterality: Left;   PILONIDAL CYST EXCISION  1972   SKIN CANCER REMOVAL      TONSILLECTOMY     UPPER GI ENDOSCOPY     VENTRAL HERNIA REPAIR      MEDICATIONS:  acetaminophen (TYLENOL) 325 MG tablet   Ascorbic Acid (VITAMIN C) 500 MG CAPS  BOTOX 100 units SOLR injection   cetirizine (ZYRTEC) 10 MG tablet   Cholecalciferol (VITAMIN D) 125 MCG (5000 UT) CAPS   gabapentin (NEURONTIN) 600 MG tablet   levothyroxine (SYNTHROID) 50 MCG tablet   pantoprazole (PROTONIX) 40 MG tablet   Polyethyl Glycol-Propyl Glycol (SYSTANE ULTRA) 0.4-0.3 % SOLN   pravastatin (PRAVACHOL) 40 MG tablet   Propylene Glycol (SYSTANE BALANCE) 0.6 % SOLN   zinc gluconate 50 MG tablet   No current facility-administered medications for this encounter.    Konrad Felix Ward, PA-C [facility] [phone]

## 2021-10-22 LAB — URINALYSIS W MICROSCOPIC + REFLEX CULTURE
Bacteria, UA: NONE SEEN /HPF
Bilirubin Urine: NEGATIVE
Glucose, UA: NEGATIVE
Hgb urine dipstick: NEGATIVE
Nitrites, Initial: NEGATIVE
RBC / HPF: NONE SEEN /HPF (ref 0–2)
Specific Gravity, Urine: 1.023 (ref 1.001–1.035)
pH: <=5 (ref 5.0–8.0)

## 2021-10-22 LAB — URINE CULTURE
MICRO NUMBER:: 13614740
Result:: NO GROWTH
SPECIMEN QUALITY:: ADEQUATE

## 2021-10-22 LAB — CULTURE INDICATED

## 2021-10-24 ENCOUNTER — Other Ambulatory Visit: Payer: Self-pay

## 2021-10-24 ENCOUNTER — Encounter (HOSPITAL_COMMUNITY): Payer: Self-pay | Admitting: Surgery

## 2021-10-24 ENCOUNTER — Ambulatory Visit (HOSPITAL_BASED_OUTPATIENT_CLINIC_OR_DEPARTMENT_OTHER): Payer: Medicare PPO | Admitting: Certified Registered Nurse Anesthetist

## 2021-10-24 ENCOUNTER — Ambulatory Visit (HOSPITAL_COMMUNITY): Payer: Medicare PPO | Admitting: Physician Assistant

## 2021-10-24 ENCOUNTER — Encounter (HOSPITAL_COMMUNITY)
Admission: RE | Disposition: A | Discharge status: Home / Self Care | Payer: Self-pay | Source: Home / Self Care | Attending: Surgery

## 2021-10-24 ENCOUNTER — Ambulatory Visit (HOSPITAL_COMMUNITY)
Admission: RE | Admit: 2021-10-24 | Discharge: 2021-10-24 | Disposition: A | Discharge status: Home / Self Care | Payer: Medicare PPO | Attending: Surgery | Admitting: Surgery

## 2021-10-24 DIAGNOSIS — I10 Essential (primary) hypertension: Secondary | ICD-10-CM | POA: Diagnosis not present

## 2021-10-24 DIAGNOSIS — R7303 Prediabetes: Secondary | ICD-10-CM

## 2021-10-24 DIAGNOSIS — N189 Chronic kidney disease, unspecified: Secondary | ICD-10-CM | POA: Insufficient documentation

## 2021-10-24 DIAGNOSIS — E785 Hyperlipidemia, unspecified: Secondary | ICD-10-CM | POA: Insufficient documentation

## 2021-10-24 DIAGNOSIS — N289 Disorder of kidney and ureter, unspecified: Secondary | ICD-10-CM

## 2021-10-24 DIAGNOSIS — R1010 Upper abdominal pain, unspecified: Secondary | ICD-10-CM | POA: Diagnosis not present

## 2021-10-24 DIAGNOSIS — K219 Gastro-esophageal reflux disease without esophagitis: Secondary | ICD-10-CM | POA: Insufficient documentation

## 2021-10-24 DIAGNOSIS — M1990 Unspecified osteoarthritis, unspecified site: Secondary | ICD-10-CM | POA: Diagnosis not present

## 2021-10-24 DIAGNOSIS — I129 Hypertensive chronic kidney disease with stage 1 through stage 4 chronic kidney disease, or unspecified chronic kidney disease: Secondary | ICD-10-CM | POA: Insufficient documentation

## 2021-10-24 DIAGNOSIS — K449 Diaphragmatic hernia without obstruction or gangrene: Secondary | ICD-10-CM | POA: Insufficient documentation

## 2021-10-24 DIAGNOSIS — R519 Headache, unspecified: Secondary | ICD-10-CM | POA: Insufficient documentation

## 2021-10-24 DIAGNOSIS — E039 Hypothyroidism, unspecified: Secondary | ICD-10-CM | POA: Insufficient documentation

## 2021-10-24 DIAGNOSIS — K4090 Unilateral inguinal hernia, without obstruction or gangrene, not specified as recurrent: Secondary | ICD-10-CM | POA: Insufficient documentation

## 2021-10-24 HISTORY — PX: INGUINAL HERNIA REPAIR: SHX194

## 2021-10-24 SURGERY — REPAIR, HERNIA, INGUINAL, ADULT
Anesthesia: General | Site: Inguinal | Laterality: Right

## 2021-10-24 MED ORDER — DOCUSATE SODIUM 100 MG PO CAPS: 100.0000 mg | ORAL_CAPSULE | Freq: Two times a day (BID) | ORAL | 0 refills | Status: AC

## 2021-10-24 MED ORDER — ORAL CARE MOUTH RINSE: 15.0000 mL | Freq: Once | OROMUCOSAL | Status: AC

## 2021-10-24 MED ORDER — CEFAZOLIN SODIUM-DEXTROSE 2-4 GM/100ML-% IV SOLN
2.0000 g | INTRAVENOUS | Status: AC
  Administered 2021-10-24: 2 g via INTRAVENOUS
  Filled 2021-10-24: qty 100

## 2021-10-24 MED ORDER — OXYCODONE HCL 5 MG PO TABS
ORAL_TABLET | ORAL | Status: AC
  Administered 2021-10-24: 5 mg via ORAL
  Filled 2021-10-24: qty 1

## 2021-10-24 MED ORDER — LIDOCAINE HCL (PF) 2 % IJ SOLN
INTRAMUSCULAR | Status: DC | PRN
  Administered 2021-10-24: 1 mg/kg/h via INTRADERMAL

## 2021-10-24 MED ORDER — CHLORHEXIDINE GLUCONATE 4 % EX LIQD: 60.0000 mL | Freq: Once | CUTANEOUS | Status: DC

## 2021-10-24 MED ORDER — BUPIVACAINE LIPOSOME 1.3 % IJ SUSP
INTRAMUSCULAR | Status: AC
  Filled 2021-10-24: qty 20

## 2021-10-24 MED ORDER — HYDRALAZINE HCL 20 MG/ML IJ SOLN
10.0000 mg | Freq: Once | INTRAMUSCULAR | Status: AC
  Administered 2021-10-24: 10 mg via INTRAVENOUS

## 2021-10-24 MED ORDER — SODIUM CHLORIDE 0.9 % IV SOLN: 250.0000 mL | INTRAVENOUS | Status: DC | PRN

## 2021-10-24 MED ORDER — ACETAMINOPHEN 500 MG PO TABS: 1000.0000 mg | ORAL_TABLET | Freq: Once | ORAL | Status: DC

## 2021-10-24 MED ORDER — ACETAMINOPHEN 500 MG PO TABS
1000.0000 mg | ORAL_TABLET | ORAL | Status: AC
  Administered 2021-10-24: 1000 mg via ORAL
  Filled 2021-10-24: qty 2

## 2021-10-24 MED ORDER — LIDOCAINE 2% (20 MG/ML) 5 ML SYRINGE
INTRAMUSCULAR | Status: DC | PRN
  Administered 2021-10-24: 60 mg via INTRAVENOUS

## 2021-10-24 MED ORDER — FENTANYL CITRATE PF 50 MCG/ML IJ SOSY
PREFILLED_SYRINGE | INTRAMUSCULAR | Status: AC
  Filled 2021-10-24: qty 1

## 2021-10-24 MED ORDER — CHLORHEXIDINE GLUCONATE 0.12 % MT SOLN
15.0000 mL | Freq: Once | OROMUCOSAL | Status: AC
  Administered 2021-10-24: 15 mL via OROMUCOSAL

## 2021-10-24 MED ORDER — LACTATED RINGERS IV SOLN: INTRAVENOUS | Status: DC

## 2021-10-24 MED ORDER — BUPIVACAINE LIPOSOME 1.3 % IJ SUSP
INTRAMUSCULAR | Status: DC | PRN
  Administered 2021-10-24: 20 mL

## 2021-10-24 MED ORDER — DEXAMETHASONE SODIUM PHOSPHATE 10 MG/ML IJ SOLN
INTRAMUSCULAR | Status: DC | PRN
  Administered 2021-10-24: 10 mg via INTRAVENOUS

## 2021-10-24 MED ORDER — ROCURONIUM BROMIDE 10 MG/ML (PF) SYRINGE
PREFILLED_SYRINGE | INTRAVENOUS | Status: DC | PRN
  Administered 2021-10-24: 60 mg via INTRAVENOUS

## 2021-10-24 MED ORDER — ONDANSETRON HCL 4 MG/2ML IJ SOLN
INTRAMUSCULAR | Status: AC
  Filled 2021-10-24: qty 2

## 2021-10-24 MED ORDER — BUPIVACAINE-EPINEPHRINE 0.25% -1:200000 IJ SOLN
INTRAMUSCULAR | Status: DC | PRN
  Administered 2021-10-24: 30 mL

## 2021-10-24 MED ORDER — OXYCODONE HCL 5 MG PO TABS: 5.0000 mg | ORAL_TABLET | Freq: Three times a day (TID) | ORAL | 0 refills | Status: AC | PRN

## 2021-10-24 MED ORDER — FENTANYL CITRATE PF 50 MCG/ML IJ SOSY
25.0000 ug | PREFILLED_SYRINGE | INTRAMUSCULAR | Status: DC | PRN
  Administered 2021-10-24 (×2): 25 ug via INTRAVENOUS

## 2021-10-24 MED ORDER — SODIUM CHLORIDE 0.9% FLUSH: 3.0000 mL | INTRAVENOUS | Status: DC | PRN

## 2021-10-24 MED ORDER — LABETALOL HCL 5 MG/ML IV SOLN
INTRAVENOUS | Status: AC
  Filled 2021-10-24: qty 4

## 2021-10-24 MED ORDER — ACETAMINOPHEN 325 MG PO TABS: 650.0000 mg | ORAL_TABLET | ORAL | Status: DC | PRN

## 2021-10-24 MED ORDER — PHENYLEPHRINE 80 MCG/ML (10ML) SYRINGE FOR IV PUSH (FOR BLOOD PRESSURE SUPPORT)
PREFILLED_SYRINGE | INTRAVENOUS | Status: DC | PRN
  Administered 2021-10-24: 80 ug via INTRAVENOUS
  Administered 2021-10-24: 40 ug via INTRAVENOUS

## 2021-10-24 MED ORDER — LIDOCAINE HCL 2 % IJ SOLN
INTRAMUSCULAR | Status: AC
  Filled 2021-10-24: qty 20

## 2021-10-24 MED ORDER — PROPOFOL 10 MG/ML IV BOLUS
INTRAVENOUS | Status: AC
  Filled 2021-10-24: qty 20

## 2021-10-24 MED ORDER — HYDRALAZINE HCL 20 MG/ML IJ SOLN
INTRAMUSCULAR | Status: AC
  Filled 2021-10-24: qty 1

## 2021-10-24 MED ORDER — DEXAMETHASONE SODIUM PHOSPHATE 10 MG/ML IJ SOLN
INTRAMUSCULAR | Status: AC
  Filled 2021-10-24: qty 1

## 2021-10-24 MED ORDER — BUPIVACAINE LIPOSOME 1.3 % IJ SUSP: 20.0000 mL | Freq: Once | INTRAMUSCULAR | Status: DC

## 2021-10-24 MED ORDER — SUGAMMADEX SODIUM 200 MG/2ML IV SOLN
INTRAVENOUS | Status: DC | PRN
  Administered 2021-10-24: 200 mg via INTRAVENOUS

## 2021-10-24 MED ORDER — BUPIVACAINE-EPINEPHRINE (PF) 0.25% -1:200000 IJ SOLN
INTRAMUSCULAR | Status: AC
  Filled 2021-10-24: qty 30

## 2021-10-24 MED ORDER — 0.9 % SODIUM CHLORIDE (POUR BTL) OPTIME
TOPICAL | Status: DC | PRN
  Administered 2021-10-24: 1000 mL

## 2021-10-24 MED ORDER — FENTANYL CITRATE PF 50 MCG/ML IJ SOSY: 25.0000 ug | PREFILLED_SYRINGE | INTRAMUSCULAR | Status: DC | PRN

## 2021-10-24 MED ORDER — ACETAMINOPHEN 650 MG RE SUPP: 650.0000 mg | RECTAL | Status: DC | PRN

## 2021-10-24 MED ORDER — ONDANSETRON HCL 4 MG/2ML IJ SOLN
INTRAMUSCULAR | Status: DC | PRN
  Administered 2021-10-24: 4 mg via INTRAVENOUS

## 2021-10-24 MED ORDER — KETAMINE HCL 10 MG/ML IJ SOLN
INTRAMUSCULAR | Status: DC | PRN
  Administered 2021-10-24: 30 mg via INTRAVENOUS

## 2021-10-24 MED ORDER — FENTANYL CITRATE (PF) 100 MCG/2ML IJ SOLN
INTRAMUSCULAR | Status: DC | PRN
  Administered 2021-10-24 (×2): 50 ug via INTRAVENOUS

## 2021-10-24 MED ORDER — FENTANYL CITRATE (PF) 100 MCG/2ML IJ SOLN
INTRAMUSCULAR | Status: AC
  Filled 2021-10-24: qty 2

## 2021-10-24 MED ORDER — PROPOFOL 10 MG/ML IV BOLUS
INTRAVENOUS | Status: DC | PRN
  Administered 2021-10-24: 40 mg via INTRAVENOUS
  Administered 2021-10-24: 90 mg via INTRAVENOUS

## 2021-10-24 MED ORDER — SODIUM CHLORIDE 0.9% FLUSH: 3.0000 mL | Freq: Two times a day (BID) | INTRAVENOUS | Status: DC

## 2021-10-24 MED ORDER — KETAMINE HCL 10 MG/ML IJ SOLN
INTRAMUSCULAR | Status: AC
Start: 2021-10-24 — End: ?
  Filled 2021-10-24: qty 1

## 2021-10-24 MED ORDER — OXYCODONE HCL 5 MG PO TABS: 5.0000 mg | ORAL_TABLET | ORAL | Status: DC | PRN

## 2021-10-24 SURGICAL SUPPLY — 46 items
APL PRP STRL LF DISP 70% ISPRP (MISCELLANEOUS) ×1
APL SKNCLS STERI-STRIP NONHPOA (GAUZE/BANDAGES/DRESSINGS) ×1
BAG COUNTER SPONGE SURGICOUNT (BAG) IMPLANT
BAG SPNG CNTER NS LX DISP (BAG)
BENZOIN TINCTURE PRP APPL 2/3 (GAUZE/BANDAGES/DRESSINGS) ×2 IMPLANT
BLADE SURG 15 STRL LF DISP TIS (BLADE) ×1 IMPLANT
BLADE SURG 15 STRL SS (BLADE) ×2
CHLORAPREP W/TINT 26 (MISCELLANEOUS) ×2 IMPLANT
COVER SURGICAL LIGHT HANDLE (MISCELLANEOUS) ×2 IMPLANT
DRAIN PENROSE 0.5X18 (DRAIN) ×1 IMPLANT
DRAPE LAPAROSCOPIC ABDOMINAL (DRAPES) ×2 IMPLANT
ELECT REM PT RETURN 15FT ADLT (MISCELLANEOUS) ×2 IMPLANT
GAUZE SPONGE 4X4 12PLY STRL (GAUZE/BANDAGES/DRESSINGS) ×1 IMPLANT
GLOVE BIO SURGEON STRL SZ 6 (GLOVE) ×2 IMPLANT
GLOVE INDICATOR 6.5 STRL GRN (GLOVE) ×2 IMPLANT
GLOVE SS BIOGEL STRL SZ 6 (GLOVE) ×1 IMPLANT
GLOVE SUPERSENSE BIOGEL SZ 6 (GLOVE)
GOWN STRL REUS W/ TWL LRG LVL3 (GOWN DISPOSABLE) ×1 IMPLANT
GOWN STRL REUS W/ TWL XL LVL3 (GOWN DISPOSABLE) IMPLANT
GOWN STRL REUS W/TWL LRG LVL3 (GOWN DISPOSABLE) ×2
GOWN STRL REUS W/TWL XL LVL3 (GOWN DISPOSABLE)
KIT BASIN OR (CUSTOM PROCEDURE TRAY) ×2 IMPLANT
KIT TURNOVER KIT A (KITS) ×1 IMPLANT
MARKER SKIN DUAL TIP RULER LAB (MISCELLANEOUS) ×2 IMPLANT
MESH ULTRAPRO 3X6 7.6X15CM (Mesh General) ×1 IMPLANT
NEEDLE HYPO 22GX1.5 SAFETY (NEEDLE) ×2 IMPLANT
PACK BASIC VI WITH GOWN DISP (CUSTOM PROCEDURE TRAY) ×2 IMPLANT
PENCIL SMOKE EVACUATOR (MISCELLANEOUS) ×1 IMPLANT
SPIKE FLUID TRANSFER (MISCELLANEOUS) ×2 IMPLANT
SPONGE T-LAP 4X18 ~~LOC~~+RFID (SPONGE) ×2 IMPLANT
STRIP CLOSURE SKIN 1/2X4 (GAUZE/BANDAGES/DRESSINGS) ×2 IMPLANT
SUT ETHIBOND 0 MO6 C/R (SUTURE) ×2 IMPLANT
SUT MNCRL AB 4-0 PS2 18 (SUTURE) ×2 IMPLANT
SUT PDS AB 0 CT1 36 (SUTURE) ×4 IMPLANT
SUT SILK 3 0 (SUTURE) ×2
SUT SILK 3-0 18XBRD TIE 12 (SUTURE) ×1 IMPLANT
SUT VIC AB 3-0 SH 27 (SUTURE) ×4
SUT VIC AB 3-0 SH 27XBRD (SUTURE) ×2 IMPLANT
SUT VICRYL 0 UR6 27IN ABS (SUTURE) IMPLANT
SUT VICRYL 3 0 BR 18  UND (SUTURE) ×2
SUT VICRYL 3 0 BR 18 UND (SUTURE) ×1 IMPLANT
SYR CONTROL 10ML LL (SYRINGE) ×2 IMPLANT
TAPE CLOTH SURG 4X10 WHT LF (GAUZE/BANDAGES/DRESSINGS) ×1 IMPLANT
TOWEL OR 17X26 10 PK STRL BLUE (TOWEL DISPOSABLE) ×2 IMPLANT
TOWEL OR NON WOVEN STRL DISP B (DISPOSABLE) ×2 IMPLANT
TRAY FOLEY MTR SLVR 16FR STAT (SET/KITS/TRAYS/PACK) IMPLANT

## 2021-10-24 NOTE — Anesthesia Preprocedure Evaluation (Addendum)
Anesthesia Evaluation  Patient identified by MRN, date of birth, ID band Patient awake    Reviewed: Allergy & Precautions, NPO status , Patient's Chart, lab work & pertinent test results  Airway Mallampati: III  TM Distance: >3 FB Neck ROM: Full    Dental no notable dental hx. (+) Teeth Intact, Dental Advisory Given   Pulmonary neg pulmonary ROS,    Pulmonary exam normal breath sounds clear to auscultation       Cardiovascular hypertension, Normal cardiovascular exam+ dysrhythmias Atrial Fibrillation  Rhythm:Regular Rate:Normal  TTE 2022 1. Left ventricular ejection fraction, by estimation, is 60 to 65%. The  left ventricle has normal function. The left ventricle has no regional  wall motion abnormalities. Left ventricular diastolic parameters are  indeterminate.  2. Right ventricular systolic function is normal. The right ventricular  size is normal. There is normal pulmonary artery systolic pressure.  3. The mitral valve is normal in structure. Mild mitral valve  regurgitation.  4. The aortic valve is tricuspid. Aortic valve regurgitation is not  visualized. Mild aortic valve sclerosis is present, with no evidence of  aortic valve stenosis.    Neuro/Psych  Headaches, negative psych ROS   GI/Hepatic Neg liver ROS, GERD  ,  Endo/Other  Hypothyroidism   Renal/GU Renal InsufficiencyRenal diseaseLab Results      Component                Value               Date                      CREATININE               1.11 (H)            10/20/2021                BUN                      26 (H)              10/20/2021                NA                       142                 10/20/2021                K                        4.2                 10/20/2021                CL                       108                 10/20/2021                CO2                      26                  10/20/2021  negative  genitourinary   Musculoskeletal  (+) Arthritis ,   Abdominal   Peds  Hematology negative hematology ROS (+) Lab Results      Component                Value               Date                      WBC                      6.4                 10/20/2021                HGB                      12.6                10/20/2021                HCT                      37.8                10/20/2021                MCV                      94.5                10/20/2021                PLT                      60 (L)              10/20/2021              Anesthesia Other Findings   Reproductive/Obstetrics                           Anesthesia Physical Anesthesia Plan  ASA: 3  Anesthesia Plan: General   Post-op Pain Management: Tylenol PO (pre-op)*, Ketamine IV* and Lidocaine infusion*   Induction: Intravenous  PONV Risk Score and Plan: 3 and Dexamethasone, Ondansetron and Treatment may vary due to age or medical condition  Airway Management Planned: Oral ETT  Additional Equipment:   Intra-op Plan:   Post-operative Plan: Extubation in OR  Informed Consent: I have reviewed the patients History and Physical, chart, labs and discussed the procedure including the risks, benefits and alternatives for the proposed anesthesia with the patient or authorized representative who has indicated his/her understanding and acceptance.     Dental advisory given  Plan Discussed with: CRNA  Anesthesia Plan Comments:        Anesthesia Quick Evaluation

## 2021-10-24 NOTE — Op Note (Signed)
Operative Note  NEVAEH KORTE  [id number]  [id number]  10/24/2021   Surgeon: Romana Juniper MD FACS (I was personally present during the key and critical portions of this procedure and immediately available throughout the entire procedure, as documented in my operative note.)  Assistant: Clerance Lav MD (PGY3)   Procedure performed: Open right inguinal hernia repair with mesh   Preop diagnosis:  right inguinal hernia   Post-op diagnosis/intraop findings: indirect inguinal hernia   Specimens: none   EBL: 5cc   Complications: none   Description of procedure: After confirming informed consent, the patient was taken to the operating room and placed supine on operating room table where general anesthesia was initiated, preoperative antibiotics were administered, SCDs applied, and a formal timeout was performed. The groin was clipped, prepped and draped in the usual sterile fashion. An oblique incision was made the just above the inguinal ligament after infiltrating the tissues with local anesthetic. Soft tissues were dissected using electrocautery until the external oblique aponeurosis was encountered. This was divided sharply to expand the external ring. A plane was bluntly developed between the round ligament/hernia sac and the external oblique.  Inspection of the inguinal anatomy revealed a moderate fat containing indirect hernia. The indirect hernia sac was bluntly dissected away from the round ligament and skeletonized to the level of the internal ring, where it was reduced intact into the abdomen.  The round ligament was ligated with vicryl ties and divided. The inguinal floor was reconstructed with interrupted 0 PDS. A 3 x 6 piece of ultra Pro mesh was brought onto the field and trimmed to approximate the field. This was sutured to the pubic tubercle fascia, inferior shelving edge and to the internal oblique superiorly with interrupted 0 ethibonds. The tail of the mesh was directed  laterally to lie flat beneath the external oblique aponeurosis. Hemostasis was ensured within the wound. The external oblique aponeurosis was reapproximated with a running 3-0 Vicryl to re-create a narrowed external ring. More local was infiltrated around the pubic tubercle and in the plane just below the external oblique. The Scarpa's was reapproximated with interrupted 3-0 Vicryls. The skin was closed with a running subcuticular 4-0 Monocryl. The remainder of the local was injected in the subcutaneous and subcuticular space. The field was then cleaned, benzoin and Steri-Strips and sterile bandage were applied. The patient was then awakened extubated and taken to PACU in stable condition.    All counts were correct at the completion of the case

## 2021-10-24 NOTE — H&P (Signed)
Jillian Rodgers [id number]    Referring Provider:  Self     Subjective    Chief Complaint: Inguinal Hernia       History of Present Illness:    76 year old woman who is status post open left inguinal hernia repair with mesh in January 2022, she had a large left indirect inguinal hernia at that time.  She has been having worsening pain in the right groin with associated mass which is variably present.  This is aggravated by standing or walking versus longer amounts of time and is quite severe.  Denies any GI symptoms associated with this.  She is also been noting epigastric and supraumbilical pain and severe abdominal tenderness here.  Denies significant issues with vomiting, regurgitation, chest pain, decreased appetite, although she does have well-controlled intermittent reflux.   Intiial visit 02/27/19: This is a very pleasant 76 year old woman who presents with a left inguinal hernia. She has noticed a bulge in this area for some time which is occasionally painful.  It is always reducible.  She does note intermittent constipation.  No prior inguinal hernia surgery but she has had an open cholecystectomy as well as an umbilical hernia repair followed by laparoscopic recurrent ventral hernia repair. CT scan in 2018 demonstrates a left inguinal hernia containing bowel however interestingly she had an ultrasound this year which was negative. Medical history notable for hypertension, hyperlipidemia, deficiency, GERD, prediabetes, hypothyroid. Follow up 10/31/19: returns and is ready to proceed with scheduling for a left inguinal hernia.  Reports no changes in the hernia since her last visit.  She has noticed that on some occasions it will be less swollen and painful after she urinates.  She is having surgery for hammertoe in September and is hoping to have the hernia repaired before that.     Review of Systems: A complete review of systems was obtained from the patient.  I have reviewed this  information and discussed as appropriate with the patient.  See HPI as well for other ROS.     Medical History:     Past Medical History:  Diagnosis Date   Arrhythmia     Arthritis     Chronic kidney disease     GERD (gastroesophageal reflux disease)     History of motion sickness     Thyroid disease           Patient Active Problem List  Diagnosis   GERD (gastroesophageal reflux disease)   Breast hematoma   CKD (chronic kidney disease), stage III (CMS-HCC)   CME (cystoid macular edema), right   Degenerative joint disease   Essential hypertension   Hyperlipidemia   Hypothyroidism   Inguinal pain   Medication management   Osteopenia   Prediabetes   Pseudophakia   Secondary iritis of right eye   Thrombocytopenia (CMS-HCC)   Upper airway cough syndrome   Vitamin D deficiency   Overweight (BMI 25.0-29.9)           Past Surgical History:  Procedure Laterality Date   BLEPHAROPLASTY UPPER EYELID Bilateral 04/23/2018    Procedure: BLEPHAROPLASTY, UPPER EYELID; WITH EXCESSIVE SKIN WEIGHTING DOWN LID;  Surgeon: Freeman Caldron, MD;  Location: [facility];  Service: Ophthalmology;  Laterality: Bilateral;  with LMA    BLEPHAROPLASTY LOWER EYELID Bilateral 04/23/2018    Procedure: BLEPHAROPLASTY, LOWER EYELID;  Surgeon: Freeman Caldron, MD;  Location: [facility];  Service: Ophthalmology;  Laterality: Bilateral;  with LMA   DERMABRASION FACE Bilateral 04/23/2018  Procedure: DERMABRASION; SEGMENTAL, LOWER LIDS;  Surgeon: Freeman Caldron, MD;  Location: [facility];  Service: Ophthalmology;  Laterality: Bilateral;  with LMA   HERNIA REPAIR       LENS EYE SURGERY          No Known Allergies         Current Outpatient Medications on File Prior to Visit  Medication Sig Dispense Refill   ascorbic acid, vitamin C, 500 mg Cap Take by mouth every morning         aspirin 81 MG chewable tablet Take 81 mg by mouth once daily       cetirizine (ZYRTEC) 10 mg capsule  nightly         cholecalciferol, vitamin D3, (CHOLECALCIFEROL, VIT D3,,BULK,) 100,000 unit/gram Powd Take by mouth       gabapentin (NEURONTIN) 600 MG tablet Preventative for headaches    0   hyoscyamine (LEVSIN/SL) 0.125 mg SL tablet DISSOLVE 1 TO 2 TABLETS UNDER THE TONGUE EVERY 4 HOURS AS NEEDED FOR NAUSEA OR CRAMPING OR DIARRHEA       levothyroxine (SYNTHROID, LEVOTHROID) 50 MCG tablet TK 1 T PO QAM OES   1   LORazepam (ATIVAN) 0.5 MG tablet lorazepam 0.5 mg tablet       onabotulinumtoxinA (BOTOX INJ) Inject as directed Every 6-8 weeks for headaches       pantoprazole (PROTONIX) 40 MG DR tablet every morning         pravastatin (PRAVACHOL) 40 MG tablet TK 1 T PO HS       ranitidine (ZANTAC) 300 MG tablet nightly          No current facility-administered medications on file prior to visit.           Family History  Problem Relation Age of Onset   Colon cancer Mother     High blood pressure (Hypertension) Father     Breast cancer Sister     Anesthesia problems Neg Hx        Social History       Tobacco Use  Smoking Status Never  Smokeless Tobacco Never      Social History        Socioeconomic History   Marital status: Married  Tobacco Use   Smoking status: Never   Smokeless tobacco: Never  Vaping Use   Vaping Use: Never used  Substance and Sexual Activity   Alcohol use: Not Currently   Drug use: Never      Objective:         Vitals:    08/25/21 1106  BP: 120/66  Pulse: 83  Temp: 36.7 C (98 F)  SpO2: 96%  Weight: 65.4 kg (144 lb 3.2 oz)  Height: 162.6 cm ('5\' 4"'$ )    Body mass index is 24.75 kg/m.   She is alert, well-appearing Unlabored respirations Abdomen is soft, exquisitely tender along the upper midline just above the umbilicus and about 10 cm cephalad, minimally tender below the umbilicus, nondistended. No mass or organomegaly. Well-healed right upper quadrant scar, umbilical scar. Port sites from prior laparoscopic hernia repair are not really  visible.  Left inguinal hernia repair is intact, she does have a moderate partially incarcerated right inguinal hernia   Assessment and Plan:  Diagnoses and all orders for this visit:   Non-recurrent unilateral inguinal hernia without obstruction or gangrene   Hiatal hernia   Pain of upper abdomen     I do recommend proceeding with open repair of  the right inguinal hernia.  We discussed the surgery again including technique, risks of bleeding, infection, pain, scarring, injury to intra-abdominal or retroperitoneal structures, hematoma/seroma/wound healing problems, chronic groin pain, hernia recurrence, as well as general cardiovascular/pulmonary/thromboembolic risks.  We also discussed the anatomy and surgical repair of hiatal hernia, which I do not think is indicated in this case as she has minimal and well controlled reflux and really no other symptoms related to the hiatal hernia.  I do not think her upper abdominal pain is related to this.  On exam there is no palpable ventral recurrent hernia but she is quite tender.  We will proceed with CT scan to further evaluate.  Possible that she has scar tissue related to previous ventral hernia repair/mesh that is causing abdominal wall/musculoskeletal pain, but would like to rule out anything more significant.   Alannah Averhart Raquel James, MD

## 2021-10-24 NOTE — Discharge Instructions (Signed)
HERNIA REPAIR: POST OP INSTRUCTIONS   EAT Gradually transition to a high fiber diet with a fiber supplement over the next few weeks after discharge.  Start with a pureed / full liquid diet (see below)  WALK Walk an hour a day (cumulative- not all at once).  Control your pain to do that.    CONTROL PAIN Control pain so that you can walk, sleep, tolerate sneezing/coughing, and go up/down stairs.  HAVE A BOWEL MOVEMENT DAILY Keep your bowels regular to avoid problems.  OK to try a laxative to override constipation.  OK to use an antidairrheal to slow down diarrhea.  Call if not better after 2 tries  CALL IF YOU HAVE PROBLEMS/CONCERNS Call if you are still struggling despite following these instructions. Call if you have concerns not answered by these instructions  ######################################################################    DIET: Follow a light bland diet & liquids the first 24 hours after arrival home, such as soup, liquids, starches, etc.  Be sure to drink plenty of fluids.  Quickly advance to a usual solid diet within a few days.  Avoid fast food or heavy meals as your are more likely to get nauseated or have irregular bowels.  A low-sugar, high-fiber diet for the rest of your life is ideal.   Take your usually prescribed home medications unless otherwise directed.  PAIN CONTROL: Pain is best controlled by a usual combination of three different methods TOGETHER: Ice/Heat Over the counter pain medication Prescription pain medication Most patients will experience some swelling and bruising around the hernia(s) such as the bellybutton, groins, or old incisions.  Ice packs or heating pads (30-60 minutes up to 6 times a day) will help. Use ice for the first few days to help decrease swelling and bruising, then switch to heat to help relax tight/sore spots and speed recovery.  Some people prefer to use ice alone, heat alone, alternating between ice & heat.  Experiment to what  works for you.  Swelling and bruising can take several weeks to resolve.   It is helpful to take an over-the-counter pain medication regularly for the first days: Naproxen (Aleve, etc)  Two 220mg  tabs twice a day OR Ibuprofen (Advil, etc) Three 200mg  tabs four times a day (every meal & bedtime) AND Acetaminophen (Tylenol, etc) 325-650mg  four times a day (every meal & bedtime) A  prescription for pain medication should be given to you upon discharge.  Take your pain medication as prescribed, IF NEEDED.  If you are having problems/concerns with the prescription medicine (does not control pain, nausea, vomiting, rash, itching, etc), please call us [phone] to see if we need to switch you to a different pain medicine that will work better for you and/or control your side effect better. If you need a refill on your pain medication, please contact your pharmacy.  They will contact our office to request authorization. Prescriptions will not be filled after 5 pm or on week-ends.  Avoid getting constipated.  Between the surgery and the pain medications, it is common to experience some constipation.  Increasing fluid intake and taking a fiber supplement (such as Metamucil, Citrucel, FiberCon, MiraLax, etc) 1-2 times a day regularly will usually help prevent this problem from occurring.  A mild laxative (prune juice, Milk of Magnesia, MiraLax, etc) should be taken according to package directions if there are no bowel movements after 48 hours.    Wash / shower every day, starting 2 days after surgery.  You may shower over the  steri strips or skin glue which are waterproof.  No rubbing, scrubbing, lotions or ointments to incision(s). Do not soak or submerge.   Remove your outer bandage 2 days after surgery. Steri strips will peel off after 1-2 weeks.  You may leave the incision open to air.  You may replace a dressing/Band-Aid to cover an incision for comfort if you wish.  Continue to shower over incision(s)  after the dressing is off.  ACTIVITIES as tolerated:   You may resume regular (light) daily activities beginning the next day--such as daily self-care, walking, climbing stairs--gradually increasing activities as tolerated.  Control your pain so that you can walk an hour a day.  If you can walk 30 minutes without difficulty, it is safe to try more intense activity such as jogging, treadmill, bicycling, low-impact aerobics, swimming, etc. Refrain from the most intensive and strenuous activity such as sit-ups, heavy lifting, contact sports, etc  Refrain from any heavy lifting or straining until 6 weeks after surgery.   DO NOT PUSH THROUGH PAIN.  Let pain be your guide: If it hurts to do something, don't do it.  Pain is your body warning you to avoid that activity for another week until the pain goes down. You may drive when you are no longer taking prescription pain medication, you can comfortably wear a seatbelt, and you can safely maneuver your car and apply brakes. You may have sexual intercourse when it is comfortable.   FOLLOW UP in our office Please call [facility] at [phone] to set up an appointment to see your surgeon in the office for a follow-up appointment approximately 2-3 weeks after your surgery. Make sure that you call for this appointment the day you arrive home to insure a convenient appointment time.  9.  If you have disability of FMLA / Family leave forms, please bring the forms to the office for processing.  (do not give to your surgeon).  WHEN TO CALL us [phone]: Poor pain control Reactions / problems with new medications (rash/itching, nausea, etc)  Fever over 101.5 F (38.5 C) Inability to urinate Nausea and/or vomiting Worsening swelling or bruising Continued bleeding from incision. Increased pain, redness, or drainage from the incision   The clinic staff is available to answer your questions during regular business hours (8:30am-5pm).  Please don't hesitate to  call and ask to speak to one of our nurses for clinical concerns.   If you have a medical emergency, go to the nearest emergency room or call 911.  A surgeon from [facility] is always on call at the hospitals in [facility], [place], [place], [place], [place]  [address] ?  [address] [address], [place], [place]   [address] MAIN: [phone] ? TOLL FREE: [phone] ? FAX: [phone] [email or web address]

## 2021-10-24 NOTE — Transfer of Care (Signed)
Immediate Anesthesia Transfer of Care Note  Patient: Jillian Rodgers  Procedure(s) Performed: OPEN RIGHT INGUINAL HERNIA REPAIR WITH MESH  (Right: Inguinal)  Patient Location: PACU  Anesthesia Type:General  Level of Consciousness: awake, alert  and patient cooperative  Airway & Oxygen Therapy: Patient Spontanous Breathing and Patient connected to face mask oxygen  Post-op Assessment: Report given to RN and Post -op Vital signs reviewed and stable  Post vital signs: Reviewed and stable  Last Vitals:  Vitals Value Taken Time  BP 182/85 10/24/21 1115  Temp    Pulse 86 10/24/21 1117  Resp 11 10/24/21 1117  SpO2 100 % 10/24/21 1117  Vitals shown include unvalidated device data.  Last Pain:  Vitals:   10/24/21 0732  TempSrc:   PainSc: 0-No pain         Complications: No notable events documented.

## 2021-10-24 NOTE — Anesthesia Procedure Notes (Addendum)
Procedure Name: Intubation Date/Time: 10/24/2021 9:53 AM  Performed by: West Pugh, CRNAPre-anesthesia Checklist: Patient identified, Emergency Drugs available, Suction available, Patient being monitored and Timeout performed Patient Re-evaluated:Patient Re-evaluated prior to induction Oxygen Delivery Method: Circle system utilized Preoxygenation: Pre-oxygenation with 100% oxygen Induction Type: IV induction Ventilation: Mask ventilation without difficulty, Oral airway inserted - appropriate to patient size and Mask ventilation throughout procedure Laryngoscope Size: 3 and Glidescope Grade View: Grade I Tube type: Oral Tube size: 7.0 mm Number of attempts: 2 Airway Equipment and Method: Rigid stylet and Video-laryngoscopy Placement Confirmation: ETT inserted through vocal cords under direct vision, positive ETCO2, CO2 detector and breath sounds checked- equal and bilateral Secured at: 22 cm Tube secured with: Tape Dental Injury: Teeth and Oropharynx as per pre-operative assessment  Comments: Small area on upper palate with protuberance,small abrasion. DL x 1 with grade 3 view no attempt made, decided to go to glidescope d/t low platelet count. Glidescope utilized and successful intubation.

## 2021-10-25 ENCOUNTER — Encounter (HOSPITAL_COMMUNITY): Payer: Self-pay | Admitting: Surgery

## 2021-10-25 NOTE — Anesthesia Postprocedure Evaluation (Signed)
Anesthesia Post Note  Patient: Jillian Rodgers  Procedure(s) Performed: OPEN RIGHT INGUINAL HERNIA REPAIR WITH MESH  (Right: Inguinal)     Patient location during evaluation: PACU Anesthesia Type: General Level of consciousness: awake and alert Pain management: pain level controlled Vital Signs Assessment: post-procedure vital signs reviewed and stable Respiratory status: spontaneous breathing, nonlabored ventilation, respiratory function stable and patient connected to nasal cannula oxygen Cardiovascular status: blood pressure returned to baseline and stable Postop Assessment: no apparent nausea or vomiting Anesthetic complications: no   No notable events documented.  Last Vitals:  Vitals:   10/24/21 1245 10/24/21 1300  BP: (!) 158/92 140/85  Pulse: 81 80  Resp: 13 (!) 23  Temp:  36.9 C  SpO2: 94% 92%    Last Pain:  Vitals:   10/24/21 1300  TempSrc:   PainSc: 3                  Karmela Bram L Yuridia Couts

## 2021-11-02 DIAGNOSIS — M542 Cervicalgia: Secondary | ICD-10-CM | POA: Diagnosis not present

## 2021-11-02 DIAGNOSIS — G43719 Chronic migraine without aura, intractable, without status migrainosus: Secondary | ICD-10-CM | POA: Diagnosis not present

## 2021-11-02 DIAGNOSIS — G518 Other disorders of facial nerve: Secondary | ICD-10-CM | POA: Diagnosis not present

## 2021-11-02 DIAGNOSIS — M791 Myalgia, unspecified site: Secondary | ICD-10-CM | POA: Diagnosis not present

## 2021-12-21 DIAGNOSIS — G43019 Migraine without aura, intractable, without status migrainosus: Secondary | ICD-10-CM | POA: Diagnosis not present

## 2021-12-21 DIAGNOSIS — M542 Cervicalgia: Secondary | ICD-10-CM | POA: Diagnosis not present

## 2021-12-21 DIAGNOSIS — G518 Other disorders of facial nerve: Secondary | ICD-10-CM | POA: Diagnosis not present

## 2021-12-21 DIAGNOSIS — M791 Myalgia, unspecified site: Secondary | ICD-10-CM | POA: Diagnosis not present

## 2021-12-23 ENCOUNTER — Other Ambulatory Visit: Payer: Self-pay

## 2021-12-23 ENCOUNTER — Emergency Department (HOSPITAL_BASED_OUTPATIENT_CLINIC_OR_DEPARTMENT_OTHER)
Admission: EM | Admit: 2021-12-23 | Discharge: 2021-12-24 | Disposition: A | Discharge status: Home / Self Care | Payer: Medicare PPO | Attending: Emergency Medicine | Admitting: Emergency Medicine

## 2021-12-23 ENCOUNTER — Emergency Department (HOSPITAL_BASED_OUTPATIENT_CLINIC_OR_DEPARTMENT_OTHER): Payer: Medicare PPO

## 2021-12-23 DIAGNOSIS — E039 Hypothyroidism, unspecified: Secondary | ICD-10-CM | POA: Diagnosis not present

## 2021-12-23 DIAGNOSIS — M1712 Unilateral primary osteoarthritis, left knee: Secondary | ICD-10-CM | POA: Diagnosis not present

## 2021-12-23 DIAGNOSIS — W228XXA Striking against or struck by other objects, initial encounter: Secondary | ICD-10-CM | POA: Insufficient documentation

## 2021-12-23 DIAGNOSIS — S8992XA Unspecified injury of left lower leg, initial encounter: Secondary | ICD-10-CM | POA: Diagnosis present

## 2021-12-23 DIAGNOSIS — L309 Dermatitis, unspecified: Secondary | ICD-10-CM | POA: Diagnosis not present

## 2021-12-23 DIAGNOSIS — S8012XA Contusion of left lower leg, initial encounter: Secondary | ICD-10-CM | POA: Insufficient documentation

## 2021-12-23 DIAGNOSIS — L602 Onychogryphosis: Secondary | ICD-10-CM | POA: Diagnosis not present

## 2021-12-23 DIAGNOSIS — I129 Hypertensive chronic kidney disease with stage 1 through stage 4 chronic kidney disease, or unspecified chronic kidney disease: Secondary | ICD-10-CM | POA: Diagnosis not present

## 2021-12-23 DIAGNOSIS — N189 Chronic kidney disease, unspecified: Secondary | ICD-10-CM | POA: Insufficient documentation

## 2021-12-23 DIAGNOSIS — I7091 Generalized atherosclerosis: Secondary | ICD-10-CM | POA: Diagnosis not present

## 2021-12-23 NOTE — ED Triage Notes (Signed)
Patient coming to ED for evaluation of swelling to L lower leg.  Reports "I hit my leg on the [place] door.  I slipped out and my leg rubbed up against it and now I have a large knot."  Reports she is able to bear weight.  Has used ice without relief in symptoms

## 2021-12-23 NOTE — ED Provider Notes (Signed)
[facility] EMERGENCY DEPT Provider Note   CSN: [id number] Arrival date & time: 12/23/21  2133     History  Chief Complaint  Patient presents with   Leg Pain    Jillian Rodgers is a 76 y.o. female.  Patient is a 76 year old female with past medical history of hypertension, hyperlipidemia, hypothyroidism, chronic renal insufficiency.  Patient presenting today for evaluation of a left leg injury.  She reports bumping her leg on the car door earlier this evening.  She reports a swollen area to the anterior shin just below the knee.  She is able to ambulate, but is concerned about a possible blood clot.  She denies any calf pain or swelling distal to the injury.  The history is provided by the patient.       Home Medications Prior to Admission medications   Medication Sig Start Date End Date Taking? Authorizing Provider  acetaminophen (TYLENOL) 325 MG tablet Take 650 mg by mouth every 6 (six) hours as needed for moderate pain or headache.    [provider]  Ascorbic Acid (VITAMIN C) 500 MG CAPS Take 500 mg by mouth daily.     [provider]  BOTOX 100 units SOLR injection Inject 100 Units into the muscle See admin instructions. Inject 100 units intramuscularly every 6-8 weeks 10/21/20   [provider]  cetirizine (ZYRTEC) 10 MG tablet Take 10 mg by mouth daily.    [provider]  Cholecalciferol (VITAMIN D) 125 MCG (5000 UT) CAPS Take 5,000 Units by mouth daily.    [provider]  gabapentin (NEURONTIN) 600 MG tablet Take 600 mg by mouth See admin instructions. Take 600 mg by mouth in morning and 1200 mg at bedtime    [provider]  levothyroxine (SYNTHROID) 50 MCG tablet TAKE 1 TABLET BY MOUTH DAILY ON A EMPTY STOMACH WITH WATER. DO NOT TAKE ANY ANTACID, CALCIUM OR MAGNESIUM FOR 4 HOURS. AVOID BIOTIN 08/19/21   Liane Comber, NP  pantoprazole (PROTONIX) 40 MG tablet Take  1 tablet  Daily  for Acid Indigestion &  Heartburn 07/05/21   Unk Pinto, MD  Polyethyl Glycol-Propyl Glycol (SYSTANE ULTRA) 0.4-0.3 % SOLN Place 1 drop into both eyes 3 (three) times daily as needed (dry eyes).    [provider]  pravastatin (PRAVACHOL) 40 MG tablet TAKE 1 TABLET BY MOUTH AT BEDTIME FOR CHOLESTEROL 05/21/21   Liane Comber, NP  Propylene Glycol (SYSTANE BALANCE) 0.6 % SOLN Place 1 drop into both eyes at bedtime.    [provider]  zinc gluconate 50 MG tablet Take 50 mg by mouth daily.    [provider]      Allergies    Patient has no known allergies.    Review of Systems   Review of Systems  All other systems reviewed and are negative.   Physical Exam Updated Vital Signs BP (!) 165/91   Pulse (!) 56   Temp 98.2 F (36.8 C) (Oral)   Resp 18   Ht '5\' 4"'$  (1.626 m)   Wt 64 kg   SpO2 95%   BMI 24.20 kg/m  Physical Exam Vitals and nursing note reviewed.  Constitutional:      General: She is not in acute distress.    Appearance: Normal appearance. She is not ill-appearing.  HENT:     Head: Normocephalic and atraumatic.  Pulmonary:     Effort: Pulmonary effort is normal.  Musculoskeletal:     Comments: Is an ecchymosis  and swollen area to the anterior shin just below the left knee.  There is no obvious deformity.  DP and PT pulses are easily palpable and motor and sensation are intact throughout the entire foot and lower leg.  She has no calf tenderness or swelling.  Bevelyn Buckles' sign is absent.  Skin:    General: Skin is warm and dry.  Neurological:     Mental Status: She is alert.     ED Results / Procedures / Treatments   Labs (all labs ordered are listed, but only abnormal results are displayed) Labs Reviewed - No data to display  EKG None  Radiology DG Tibia/Fibula Left  Result Date: 12/23/2021 CLINICAL DATA:  Left lower extremity pain. EXAM: LEFT TIBIA AND FIBULA - 2 VIEW COMPARISON:  Left ankle radiograph dated 06/30/2008. FINDINGS: There is no acute  fracture or dislocation. The bones are osteopenic. Mild degenerative changes of the left knee with meniscal calcinosis of the lateral compartment. No joint effusion. The soft tissues are unremarkable. IMPRESSION: No acute fracture or dislocation. Electronically Signed   By: Anner Crete M.D.   On: 12/23/2021 22:40    Procedures Procedures    Medications Ordered in ED Medications - No data to display  ED Course/ Medical Decision Making/ A&P  X-rays are negative.  This to be treated as a contusion with rest, ice, Tylenol, and follow-up as needed.  I have very little suspicion of DVT.  Patient to return as needed for any problems.  Final Clinical Impression(s) / ED Diagnoses Final diagnoses:  None    Rx / DC Orders ED Discharge Orders     None         Veryl Speak, MD 12/23/21 2350

## 2021-12-23 NOTE — Discharge Instructions (Signed)
Ice for 20 minutes every 2 hours while awake for the next 2 days.  Elevate your leg.  Follow-up with primary doctor if symptoms are not improving in the next week, and return to the ER if you develop calf pain, increased swelling of your leg, or for other new and concerning symptoms.

## 2021-12-27 ENCOUNTER — Encounter: Payer: Self-pay | Admitting: Nurse Practitioner

## 2021-12-27 ENCOUNTER — Ambulatory Visit: Payer: Medicare PPO | Admitting: Nurse Practitioner

## 2021-12-27 VITALS — BP 118/72 | HR 68 | Temp 97.7°F | Ht 64.0 in | Wt 145.4 lb

## 2021-12-27 DIAGNOSIS — N1831 Chronic kidney disease, stage 3a: Secondary | ICD-10-CM | POA: Diagnosis not present

## 2021-12-27 DIAGNOSIS — S8012XA Contusion of left lower leg, initial encounter: Secondary | ICD-10-CM

## 2021-12-27 DIAGNOSIS — R0989 Other specified symptoms and signs involving the circulatory and respiratory systems: Secondary | ICD-10-CM

## 2021-12-27 NOTE — Patient Instructions (Signed)
Hematoma A hematoma is a collection of blood. A hematoma can happen: Under the skin. In an organ. In a body space. In a joint space. In other tissues. The blood can thicken (clot) to form a lump that you can see and feel. The lump is often hard and may become sore and tender. The lump can be very small or very big. Most hematomas get better in a few days to weeks. However, some hematomas may be serious and need medical care. What are the causes? This condition is caused by: An injury. Blood that leaks under the skin. Problems from surgeries. Medical conditions that cause bleeding or bruising. What increases the risk? You are more likely to develop this condition if: You are an older adult. You use medicines that thin your blood. What are the signs or symptoms? Symptoms depend on where the hematoma is in your body. If the hematoma is under the skin, there is: A firm lump on the body. Pain and tenderness in the area. Bruising. The skin above the lump may be blue, dark blue, purple-red, or yellowish. If the hematoma is deep in the tissues or body spaces, there may be: Blood in the stomach. This may cause pain in the belly (abdomen), weakness, passing out (fainting), and shortness of breath. Blood in the head. This may cause a headache, weakness, trouble speaking or understanding speech, or passing out. How is this diagnosed? This condition is diagnosed based on: Your medical history. A physical exam. Imaging tests, such as ultrasound or CT scan. Blood tests. How is this treated? Treatment depends on the cause, size, and location of the hematoma. Treatment may include: Doing nothing. Many hematomas go away on their own without treatment. Surgery or close monitoring. This may be needed for large hematomas or hematomas that affect the body's organs. Medicines. These may be given if a medical condition caused the hematoma. Follow these instructions at home: Managing pain, stiffness,  and swelling  If told, put ice on the area. Put ice in a plastic bag. Place a towel between your skin and the bag. Leave the ice on for 20 minutes, 2-3 times a day for the first two days. If told, put heat on the affected area after putting ice on the area for two days. Use the heat source that your doctor tells you to use. This could be a moist heat pack or a heating pad. To do this: Place a towel between your skin and the heat source. Leave the heat on for 20-30 minutes. Remove the heat if your skin turns bright red. This is very important if you are unable to feel pain, heat, or cold. You may have a greater risk of getting burned. Raise (elevate) the affected area above the level of your heart while you are sitting or lying down. Wrap the affected area with an elastic bandage, if told by your doctor. Do not wrap the bandage too tightly. If your hematoma is on a leg or foot and is painful, your doctor may give you crutches. Use them as told by your doctor. General instructions Take over-the-counter and prescription medicines only as told by your doctor. Keep all follow-up visits as told by your doctor. This is important. Contact a doctor if: You have a fever. The swelling or bruising gets worse. You start to get more hematomas. Get help right away if: Your pain gets worse. Your pain is not getting better with medicine. Your skin over the hematoma breaks or starts to bleed.   Your hematoma is in your chest or belly and you: Pass out. Feel weak. Become short of breath. You have a hematoma on your scalp that is caused by a fall or injury, and you: Have a headache that gets worse. Have trouble speaking or understanding speech. Become less alert or you pass out. Summary A hematoma is a collection of blood in any part of your body. Most hematomas get better on their own in a few days to weeks. Some may need medical care. Follow instructions from your doctor about how to care for your  hematoma. Contact a doctor if the swelling or bruising gets worse, or if you are short of breath. This information is not intended to replace advice given to you by your health care provider. Make sure you discuss any questions you have with your health care provider. Document Revised: 01/27/2021 Document Reviewed: 01/27/2021 Elsevier Patient Education  2023 [employer]vier Inc[employer]  

## 2021-12-27 NOTE — Progress Notes (Signed)
Assessment and Plan:  Jillian Rodgers was seen today for follow-up.  Diagnoses and all orders for this visit:  Hematoma of left lower leg Continue to elevate throughout the day and apply ice Normal Xrays at the ER Unable to take antiinflammatories due to Stage 3 B CKD Use tylenol as needed for pain If swelling or pain increases notify the office  Labile hypertension - continue  DASH diet, exercise and monitor at home. Call if greater than 130/80.    Stage 3A CKD(HCC) Increase fluids, avoid NSAIDS, monitor sugars, will monitor     Further disposition pending results of labs. Discussed med's effects and SE's.   Over 30 minutes of exam, counseling, chart review, and critical decision making was performed.   Future Appointments  Date Time Provider [place]  12/29/2021 11:30 AM Benay Pike, MD [facility] None  02/01/2022  2:30 PM Unk Pinto, MD [facility] None  07/25/2022  2:00 PM Unk Pinto, MD [facility] None    ------------------------------------------------------------------------------------------------------------------   HPI BP 118/72   Pulse 68   Temp 97.7 F (36.5 C)   Ht '5\' 4"'  (1.626 m)   Wt 145 lb 6.4 oz (66 kg)   SpO2 98%   BMI 24.96 kg/m    76 y.o.female presents for injury to her left shin, hit it on car frame trying to get into the [place].  Injury occurred 5 days ago.  Xray showed no fractures.   She is currently well controlled without medication. Denies headaches, chest pain , shortness of breath and dizziness BP Readings from Last 3 Encounters:  12/27/21 118/72  12/24/21 (!) 171/99  10/24/21 140/85   She continues to push fluids and avoid NSAIDs for stage 3a CKD Lab Results  Component Value Date   EGFR 51 (L) 10/20/2021     Past Medical History:  Diagnosis Date   Anemia with low platelet count (HCC)    Chronic kidney disease    Degenerative joint disease    Dysrhythmia    a fib   GERD (gastroesophageal reflux disease)     History of kidney stones    Hyperlipidemia    Hypothyroidism    Inguinal hernia 02/19/2019   Labile hypertension    Migraines    neurontin helps   Pneumonia    hx of walking pneumonia      No Known Allergies  Current Outpatient Medications on File Prior to Visit  Medication Sig   acetaminophen (TYLENOL) 325 MG tablet Take 650 mg by mouth every 6 (six) hours as needed for moderate pain or headache.   Ascorbic Acid (VITAMIN C) 500 MG CAPS Take 500 mg by mouth daily.    cetirizine (ZYRTEC) 10 MG tablet Take 10 mg by mouth daily.   Cholecalciferol (VITAMIN D) 125 MCG (5000 UT) CAPS Take 5,000 Units by mouth daily.   gabapentin (NEURONTIN) 600 MG tablet Take 600 mg by mouth See admin instructions. Take 600 mg by mouth in morning and 1200 mg at bedtime   levothyroxine (SYNTHROID) 50 MCG tablet TAKE 1 TABLET BY MOUTH DAILY ON A EMPTY STOMACH WITH WATER. DO NOT TAKE ANY ANTACID, CALCIUM OR MAGNESIUM FOR 4 HOURS. AVOID BIOTIN   pantoprazole (PROTONIX) 40 MG tablet Take  1 tablet  Daily  for Acid Indigestion & Heartburn   Polyethyl Glycol-Propyl Glycol (SYSTANE ULTRA) 0.4-0.3 % SOLN Place 1 drop into both eyes 3 (three) times daily as needed (dry eyes).   pravastatin (PRAVACHOL) 40 MG tablet TAKE 1 TABLET BY MOUTH AT BEDTIME FOR CHOLESTEROL  Propylene Glycol (SYSTANE BALANCE) 0.6 % SOLN Place 1 drop into both eyes at bedtime.   zinc gluconate 50 MG tablet Take 50 mg by mouth daily.   BOTOX 100 units SOLR injection Inject 100 Units into the muscle See admin instructions. Inject 100 units intramuscularly every 6-8 weeks   No current facility-administered medications on file prior to visit.    ROS: all negative except above.   Physical Exam:  BP 118/72   Pulse 68   Temp 97.7 F (36.5 C)   Ht '5\' 4"'  (1.626 m)   Wt 145 lb 6.4 oz (66 kg)   SpO2 98%   BMI 24.96 kg/m   General Appearance: Well nourished, in no apparent distress. Eyes: PERRLA, EOMs, conjunctiva no swelling or  erythema Sinuses: No Frontal/maxillary tenderness ENT/Mouth: Ext aud canals clear, TMs without erythema, bulging. No erythema, swelling, or exudate on post pharynx.  Tonsils not swollen or erythematous. Hearing normal.  Neck: Supple, thyroid normal.  Respiratory: Respiratory effort normal, BS equal bilaterally without rales, rhonchi, wheezing or stridor.  Cardio: RRR with no MRGs. Brisk peripheral pulses without edema.  Abdomen: Soft, + BS.  Non tender, no guarding, rebound, hernias, masses. Lymphatics: Non tender without lymphadenopathy.  Musculoskeletal: Full ROM, 5/5 strength, antalgic gait Skin: Warm, dry . 4 cm hematoma noted of left shin, tender to touch, small amount of ecchymosis Neuro: Cranial nerves intact. Normal muscle tone, no cerebellar symptoms. Sensation intact.  Psych: Awake and oriented X 3, normal affect, Insight and Judgment appropriate.     Alycia Rossetti, NP 11:29 AM [facility]

## 2021-12-29 ENCOUNTER — Encounter: Payer: Self-pay | Admitting: Hematology and Oncology

## 2021-12-29 ENCOUNTER — Other Ambulatory Visit: Payer: Self-pay | Admitting: *Deleted

## 2021-12-29 ENCOUNTER — Inpatient Hospital Stay: Payer: Medicare PPO

## 2021-12-29 ENCOUNTER — Inpatient Hospital Stay: Payer: Medicare PPO | Attending: Hematology and Oncology | Admitting: Hematology and Oncology

## 2021-12-29 VITALS — BP 117/83 | HR 71 | Temp 97.9°F | Resp 16 | Ht 64.0 in | Wt 145.2 lb

## 2021-12-29 DIAGNOSIS — I4891 Unspecified atrial fibrillation: Secondary | ICD-10-CM | POA: Diagnosis not present

## 2021-12-29 DIAGNOSIS — Z79899 Other long term (current) drug therapy: Secondary | ICD-10-CM | POA: Diagnosis not present

## 2021-12-29 DIAGNOSIS — D696 Thrombocytopenia, unspecified: Secondary | ICD-10-CM | POA: Diagnosis not present

## 2021-12-29 DIAGNOSIS — Z7901 Long term (current) use of anticoagulants: Secondary | ICD-10-CM | POA: Diagnosis not present

## 2021-12-29 DIAGNOSIS — D509 Iron deficiency anemia, unspecified: Secondary | ICD-10-CM

## 2021-12-29 LAB — CBC WITH DIFFERENTIAL/PLATELET
Abs Immature Granulocytes: 0.02 10*3/uL (ref 0.00–0.07)
Basophils Absolute: 0 10*3/uL (ref 0.0–0.1)
Basophils Relative: 1 %
Eosinophils Absolute: 0.1 10*3/uL (ref 0.0–0.5)
Eosinophils Relative: 2 %
HCT: 39.1 % (ref 36.0–46.0)
Hemoglobin: 13 g/dL (ref 12.0–15.0)
Immature Granulocytes: 0 %
Lymphocytes Relative: 24 %
Lymphs Abs: 1.6 10*3/uL (ref 0.7–4.0)
MCH: 31 pg (ref 26.0–34.0)
MCHC: 33.2 g/dL (ref 30.0–36.0)
MCV: 93.1 fL (ref 80.0–100.0)
Monocytes Absolute: 0.6 10*3/uL (ref 0.1–1.0)
Monocytes Relative: 10 %
Neutro Abs: 4.1 10*3/uL (ref 1.7–7.7)
Neutrophils Relative %: 63 %
Platelets: 50 10*3/uL — ABNORMAL LOW (ref 150–400)
RBC: 4.2 MIL/uL (ref 3.87–5.11)
RDW: 12.5 % (ref 11.5–15.5)
WBC: 6.5 10*3/uL (ref 4.0–10.5)
nRBC: 0 % (ref 0.0–0.2)

## 2021-12-29 NOTE — Progress Notes (Signed)
[place] [phone]   [phone]  PROGRESS NOTE  Patient Care Team: Unk Pinto, MD as PCP - General (Internal Medicine) Belva Crome, MD as PCP - Cardiology (Cardiology) Corliss Parish, [name]   CHIEF COMPLAINTS/PURPOSE OF CONSULTATION:  Thrombocytopenia  HISTORY OF PRESENTING ILLNESS:  Jillian Rodgers 76 y.o. female returns for a follow up for thrombocytopenia.   Jillian Rodgers is here for follow-up.  She has been doing relatively well since her last visit.  She recently had a fall, had a left shin contusion, no fracture. Besides this episode, she denies any new B symptoms.  She has gained a couple pounds back.  No interim infections, hospitalizations, bleeding complaints.  No change in medications.  Rest of the pertinent 10 point ROS reviewed and negative  MEDICAL HISTORY:  Past Medical History:  Diagnosis Date   Anemia with low platelet count (HCC)    Chronic kidney disease    Degenerative joint disease    Dysrhythmia    a fib   GERD (gastroesophageal reflux disease)    History of kidney stones    Hyperlipidemia    Hypothyroidism    Inguinal hernia 02/19/2019   Labile hypertension    Migraines    neurontin helps   Pneumonia    hx of walking pneumonia     SURGICAL HISTORY: Past Surgical History:  Procedure Laterality Date   BILATERAL CATARACT SURGERY      BLERPHOROPLASTY \     CHOLECYSTECTOMY  1996   open   COLONOSCOPY  2009   recommended 10 year f/u   HERNIA REPAIR  1985   umb hernia rpr   HERNIA REPAIR  2005 & 2009   ventral hernia rpr   INGUINAL HERNIA REPAIR Left 04/30/2020   Procedure: OPEN LEFT INGUINAL HERNIA REPAIR WITH MESH;  Surgeon: Clovis Riley, MD;  Location: [facility];  Service: General;  Laterality: Left;   INGUINAL HERNIA REPAIR Right 10/24/2021   Procedure: OPEN RIGHT INGUINAL HERNIA REPAIR WITH MESH ;  Surgeon: Clovis Riley, MD;  Location: [facility];   Service: General;  Laterality: Right;   PILONIDAL CYST EXCISION  1972   SKIN CANCER REMOVAL      TONSILLECTOMY     UPPER GI ENDOSCOPY     VENTRAL HERNIA REPAIR      SOCIAL HISTORY: Social History   Socioeconomic History   Marital status: Married    Spouse name: Not on file   Number of children: 2   Years of education: 18   Highest education level: Not on file  Occupational History   Not on file  Tobacco Use   Smoking status: Never   Smokeless tobacco: Never  Vaping Use   Vaping Use: Never used  Substance and Sexual Activity   Alcohol use: No   Drug use: No   Sexual activity: Yes  Other Topics Concern   Not on file  Social History Narrative   Not on file   Social Determinants of Health   Financial Resource Strain: Not on file  Food Insecurity: Not on file  Transportation Needs: Not on file  Physical Activity: Not on file  Stress: Not on file  Social Connections: Not on file  Intimate Partner Violence: Not on file    FAMILY HISTORY: Family History  Problem Relation Age of Onset   Ovarian cancer Mother    Heart disease Mother    Cancer Mother    Uterine cancer Mother  Hypertension Mother    Breast cancer Sister    Stroke Paternal Uncle    Congestive Heart Failure Father    Gout Maternal Aunt    Colon cancer Neg Hx    Esophageal cancer Neg Hx    Stomach cancer Neg Hx    Rectal cancer Neg Hx     ALLERGIES:  has No Known Allergies.  MEDICATIONS:  Current Outpatient Medications  Medication Sig Dispense Refill   acetaminophen (TYLENOL) 325 MG tablet Take 650 mg by mouth every 6 (six) hours as needed for moderate pain or headache.     Ascorbic Acid (VITAMIN C) 500 MG CAPS Take 500 mg by mouth daily.      BOTOX 100 units SOLR injection Inject 100 Units into the muscle See admin instructions. Inject 100 units intramuscularly every 6-8 weeks     cetirizine (ZYRTEC) 10 MG tablet Take 10 mg by mouth daily.     Cholecalciferol (VITAMIN D) 125 MCG (5000 UT)  CAPS Take 5,000 Units by mouth daily.     gabapentin (NEURONTIN) 600 MG tablet Take 600 mg by mouth See admin instructions. Take 600 mg by mouth in morning and 1200 mg at bedtime     levothyroxine (SYNTHROID) 50 MCG tablet TAKE 1 TABLET BY MOUTH DAILY ON A EMPTY STOMACH WITH WATER. DO NOT TAKE ANY ANTACID, CALCIUM OR MAGNESIUM FOR 4 HOURS. AVOID BIOTIN 90 tablet 3   pantoprazole (PROTONIX) 40 MG tablet Take  1 tablet  Daily  for Acid Indigestion & Heartburn 90 tablet 3   Polyethyl Glycol-Propyl Glycol (SYSTANE ULTRA) 0.4-0.3 % SOLN Place 1 drop into both eyes 3 (three) times daily as needed (dry eyes).     pravastatin (PRAVACHOL) 40 MG tablet TAKE 1 TABLET BY MOUTH AT BEDTIME FOR CHOLESTEROL 90 tablet 3   Propylene Glycol (SYSTANE BALANCE) 0.6 % SOLN Place 1 drop into both eyes at bedtime.     zinc gluconate 50 MG tablet Take 50 mg by mouth daily.     No current facility-administered medications for this visit.    REVIEW OF SYSTEMS:   Constitutional: ( - ) fevers, ( - )  chills , ( - ) night sweats Eyes: ( - ) blurriness of vision, ( - ) double vision, ( - ) watery eyes Ears, nose, mouth, throat, and face: ( - ) mucositis, ( - ) sore throat Respiratory: ( - ) cough, ( - ) dyspnea, ( - ) wheezes Cardiovascular: ( - ) palpitation, ( - ) chest discomfort, ( - ) lower extremity swelling Gastrointestinal:  ( - ) nausea, ( - ) heartburn, ( - ) change in bowel habits Skin: ( - ) abnormal skin rashes Lymphatics: ( - ) new lymphadenopathy, ( - ) easy bruising Neurological: ( - ) numbness, ( - ) tingling, ( - ) new weaknesses Behavioral/Psych: ( - ) mood change, ( - ) new changes  All other systems were reviewed with the patient and are negative.  PHYSICAL EXAMINATION: ECOG PERFORMANCE STATUS: 0 - Asymptomatic  Vitals:   12/29/21 1157  BP: 117/83  Pulse: 71  Resp: 16  Temp: 97.9 F (36.6 C)  SpO2: 95%   Filed Weights   12/29/21 1157  Weight: 145 lb 3.2 oz (65.9 kg)    Physical  Exam Constitutional:      Appearance: Normal appearance.  Cardiovascular:     Rate and Rhythm: Normal rate and regular rhythm.     Pulses: Normal pulses.     Heart sounds:  Normal heart sounds.  Pulmonary:     Effort: Pulmonary effort is normal.     Breath sounds: Normal breath sounds.  Abdominal:     General: Abdomen is flat. Bowel sounds are normal.     Palpations: Abdomen is soft.  Musculoskeletal:        General: No swelling. Normal range of motion.     Cervical back: Normal range of motion and neck supple. No rigidity.  Lymphadenopathy:     Cervical: No cervical adenopathy.  Skin:    General: Skin is warm and dry.  Neurological:     General: No focal deficit present.     Mental Status: She is alert.      LABORATORY DATA:  I have reviewed the data as listed    Latest Ref Rng & Units 10/20/2021    3:35 PM 09/28/2021   11:06 AM 07/18/2021    3:32 PM  CBC  WBC 3.8 - 10.8 Thousand/uL 6.4  6.2  8.4   Hemoglobin 11.7 - 15.5 g/dL 12.6  12.8  12.0   Hematocrit 35.0 - 45.0 % 37.8  38.6  36.6   Platelets 140 - 400 Thousand/uL 60  61  53        Latest Ref Rng & Units 10/20/2021    3:35 PM 07/18/2021    3:32 PM 06/30/2021   11:29 AM  CMP  Glucose 65 - 99 mg/dL 102  56  72   BUN 7 - 25 mg/dL '26  25  22   ' Creatinine 0.60 - 1.00 mg/dL 1.11  0.97  1.09   Sodium 135 - 146 mmol/L 142  142  139   Potassium 3.5 - 5.3 mmol/L 4.2  4.1  4.5   Chloride 98 - 110 mmol/L 108  107  106   CO2 20 - 32 mmol/L '26  27  27   ' Calcium 8.6 - 10.4 mg/dL 9.9  9.5  10.6   Total Protein 6.1 - 8.1 g/dL 6.4  6.0  7.2   Total Bilirubin 0.2 - 1.2 mg/dL 0.5  0.3  0.7   Alkaline Phos 38 - 126 U/L   73   AST 10 - 35 U/L '18  14  15   ' ALT 6 - 29 U/L '13  14  11     ' ASSESSMENT & PLAN MIESHIA PEPITONE is a 76 y.o. female who presents for a follow up for thrombocytopenia.   #Thrombocytopenia: --Patient has undergone serologic workup on November 2021 without no evidence of hepatitis, hypothyroidism, nutritional  deficiencies.  --MR of the abdomen from May 2021 shows no evidence of liver disease or splenomegaly.  --Bone marrow biopsy from 03/19/2020 showed hypercellularity, no evidence of dyspoeisis, megakaryocytic hyperplasia.  --Flow cytometry negative for any monoclonal B cell population. Cytogenetics was normal.  --Labs from today show platelet count of 50,000 --Likely etiology is immune mediated versus early bone marrow disorder. Continue to monitor for now as platelet count has been stable for several years.  --Strict precautions for bleeding were given. She will refrain from using aspirin or NSAIDs.  --She is currently on Eliquis for A.fib. Plan to continue at this time as per her cardiology team.  Okay to continue Eliquis at this time since her platelet count is 50,000 or greater.  We will bring her back for CBC in a week.  We will see her back in about 3 months for labs and 6 months for follow-up --RTC in 3 months for labs in 6 months for  follow-up.  No orders of the defined types were placed in this encounter.  All questions were answered. The patient knows to call the clinic with any problems, questions or concerns.  I have spent a total of 20  minutes minutes of face-to-face and non-face-to-face time, preparing to see the patient, performing a medically appropriate examination, counseling and educating the patient, documenting clinical information in the electronic health record, and care coordination.   Benay Pike MD

## 2022-01-02 ENCOUNTER — Other Ambulatory Visit: Payer: Self-pay

## 2022-01-02 ENCOUNTER — Inpatient Hospital Stay: Payer: Medicare PPO

## 2022-01-02 DIAGNOSIS — Z7901 Long term (current) use of anticoagulants: Secondary | ICD-10-CM | POA: Diagnosis not present

## 2022-01-02 DIAGNOSIS — D509 Iron deficiency anemia, unspecified: Secondary | ICD-10-CM

## 2022-01-02 DIAGNOSIS — Z79899 Other long term (current) drug therapy: Secondary | ICD-10-CM | POA: Diagnosis not present

## 2022-01-02 DIAGNOSIS — D696 Thrombocytopenia, unspecified: Secondary | ICD-10-CM

## 2022-01-02 DIAGNOSIS — I4891 Unspecified atrial fibrillation: Secondary | ICD-10-CM | POA: Diagnosis not present

## 2022-01-02 LAB — CBC WITH DIFFERENTIAL/PLATELET
Abs Immature Granulocytes: 0.01 10*3/uL (ref 0.00–0.07)
Basophils Absolute: 0 10*3/uL (ref 0.0–0.1)
Basophils Relative: 0 %
Eosinophils Absolute: 0.1 10*3/uL (ref 0.0–0.5)
Eosinophils Relative: 2 %
HCT: 38.3 % (ref 36.0–46.0)
Hemoglobin: 12.7 g/dL (ref 12.0–15.0)
Immature Granulocytes: 0 %
Lymphocytes Relative: 21 %
Lymphs Abs: 1.3 10*3/uL (ref 0.7–4.0)
MCH: 31.4 pg (ref 26.0–34.0)
MCHC: 33.2 g/dL (ref 30.0–36.0)
MCV: 94.6 fL (ref 80.0–100.0)
Monocytes Absolute: 0.6 10*3/uL (ref 0.1–1.0)
Monocytes Relative: 10 %
Neutro Abs: 4.2 10*3/uL (ref 1.7–7.7)
Neutrophils Relative %: 67 %
Platelets: 48 10*3/uL — ABNORMAL LOW (ref 150–400)
RBC: 4.05 MIL/uL (ref 3.87–5.11)
RDW: 12.5 % (ref 11.5–15.5)
WBC: 6.2 10*3/uL (ref 4.0–10.5)
nRBC: 0 % (ref 0.0–0.2)

## 2022-01-18 NOTE — Progress Notes (Signed)
Pt request to call her back in the afternoon

## 2022-01-23 ENCOUNTER — Encounter: Payer: Self-pay | Admitting: *Deleted

## 2022-01-23 ENCOUNTER — Telehealth: Payer: Self-pay | Admitting: *Deleted

## 2022-01-23 ENCOUNTER — Ambulatory Visit: Payer: Medicare PPO | Admitting: Nurse Practitioner

## 2022-01-23 NOTE — Progress Notes (Deleted)
  Your cpap titration order has expired. Call our office to schedule an office visit to re-evaluate the need for a sleep study.  Sincerely,  [facility]

## 2022-01-23 NOTE — Telephone Encounter (Signed)
Letter has been sent to patient informing them that their cpap titration has expired. Patient will need to call and schedule an office visit to re-evaluate the need for a sleep study.

## 2022-01-23 NOTE — Telephone Encounter (Signed)
Letter has been sent to patient informing them that their sleep study has expired. Patient will need to call and schedule an office visit to re-evaluate the need for a sleep study.    

## 2022-01-30 ENCOUNTER — Ambulatory Visit (INDEPENDENT_AMBULATORY_CARE_PROVIDER_SITE_OTHER): Payer: Medicare PPO

## 2022-01-30 VITALS — Temp 97.9°F

## 2022-01-30 DIAGNOSIS — Z23 Encounter for immunization: Secondary | ICD-10-CM

## 2022-01-31 DIAGNOSIS — M791 Myalgia, unspecified site: Secondary | ICD-10-CM | POA: Diagnosis not present

## 2022-01-31 DIAGNOSIS — M542 Cervicalgia: Secondary | ICD-10-CM | POA: Diagnosis not present

## 2022-01-31 DIAGNOSIS — G43719 Chronic migraine without aura, intractable, without status migrainosus: Secondary | ICD-10-CM | POA: Diagnosis not present

## 2022-01-31 DIAGNOSIS — G518 Other disorders of facial nerve: Secondary | ICD-10-CM | POA: Diagnosis not present

## 2022-02-01 ENCOUNTER — Ambulatory Visit: Payer: Medicare PPO | Admitting: Internal Medicine

## 2022-02-01 ENCOUNTER — Encounter: Payer: Self-pay | Admitting: Internal Medicine

## 2022-02-01 VITALS — BP 104/62 | HR 69 | Temp 97.9°F | Resp 17 | Ht 64.0 in | Wt 153.0 lb

## 2022-02-01 DIAGNOSIS — E559 Vitamin D deficiency, unspecified: Secondary | ICD-10-CM | POA: Diagnosis not present

## 2022-02-01 DIAGNOSIS — Z79899 Other long term (current) drug therapy: Secondary | ICD-10-CM | POA: Diagnosis not present

## 2022-02-01 DIAGNOSIS — K219 Gastro-esophageal reflux disease without esophagitis: Secondary | ICD-10-CM | POA: Diagnosis not present

## 2022-02-01 DIAGNOSIS — I480 Paroxysmal atrial fibrillation: Secondary | ICD-10-CM

## 2022-02-01 DIAGNOSIS — E039 Hypothyroidism, unspecified: Secondary | ICD-10-CM | POA: Diagnosis not present

## 2022-02-01 DIAGNOSIS — R7309 Other abnormal glucose: Secondary | ICD-10-CM | POA: Diagnosis not present

## 2022-02-01 DIAGNOSIS — E782 Mixed hyperlipidemia: Secondary | ICD-10-CM

## 2022-02-01 DIAGNOSIS — N1831 Chronic kidney disease, stage 3a: Secondary | ICD-10-CM

## 2022-02-01 DIAGNOSIS — R0989 Other specified symptoms and signs involving the circulatory and respiratory systems: Secondary | ICD-10-CM

## 2022-02-01 MED ORDER — PANTOPRAZOLE SODIUM 40 MG PO TBEC: DELAYED_RELEASE_TABLET | ORAL | 3 refills | Status: DC

## 2022-02-01 NOTE — Progress Notes (Signed)
Future Appointments  Date Time Provider Department  02/01/2022                 6 mo ov  2:30 PM Unk Pinto, MD [facility]  03/31/2022 10:30 AM [facility]  06/29/2022 11:45 AM Benay Pike, MD [facility]  07/25/2022                      cpe  2:00 PM Unk Pinto, MD [facility]     History of Present Illness:       This very nice 76 y.o. MWF  presents for 6 month follow up with HTN, HLD, Hypothyroidism, Pre-Diabetes and Vitamin D Deficiency. Patient has hx/o GERD & is c/o increased "acid Indigestion " & reflux .  Patient is followed by Dr Arletha Pili Iruku  (Heme/Onc) for Anemia/Thrombocytopenia.        Patient has hx/o labile HTN  (1990's) & BP has been controlled at home. Today's BP was initially sl elevated & rechecked at goal-  140/90.  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  On October 25, 2020, patient was discovered in AFib & CV by Dr Sherwood Gambler & started on Eliquis (CHA2Ds2VASc= 3) Patient has had no complaints of any cardiac type chest pain, palpitations, dyspnea / orthopnea / PND, dizziness, claudication, or dependent edema.       Hyperlipidemia is controlled with diet & Pravastatin . Patient denies myalgias or other med SE's. Last Lipids were  Lab Results  Component Value Date   CHOL 168 10/20/2021   HDL 64 10/20/2021   LDLCALC 82 10/20/2021   TRIG 123 10/20/2021   CHOLHDL 2.6 10/20/2021         Patient has been on Thyroid Replacement since the 1990's.    Also, the patient has history of PreDiabetes (A1c 6.0% /2012) and has had no symptoms of reactive hypoglycemia, diabetic polys, paresthesias or visual blurring.  Last A1c was Normal & at goal:  Lab Results  Component Value Date   HGBA1C 5.4 10/20/2021                                                       Further, the patient also has history of Vitamin D Deficiency ("39" /2012) and supplements vitamin D without any suspected  side-effects. Last vitamin D was at goal:  Lab Results  Component Value Date   VD25OH 79 10/20/2021       Current Outpatient Medications on File Prior to Visit  Medication Sig   ELIQUIS 5 MG TABS Take 1 tablet  2  times daily.   VITAMIN C  500 MG CAPS Take  daily.    BOTOX 100 units SOLR inject    cetirizine  10 MG tablet Take  daily.   VITAMIN D 5,000 Units  Take  daily.   gabapentin  600 MG tablet Take 900 mg -morning and 1200 mg -bedtime   levothyroxine 50 MCG tablet Take  1 tablet  Daily   oxybutynin 5 MG tablet TAKE 1 TABLET AT BEDTIME    pantoprazole 40 MG tablet Take 1 tablet Daily f   pravastatin 40 MG tablet TAKE 1 TABLET  AT BEDTIME    zinc 50 MG tablet Take daily.    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[place])    Degenerative joint disease    GERD (gastroesophageal reflux disease)    History of kidney stones    Hyperlipidemia    Hypertension    off of b/p meds due to blood pressure fluctuating - discontinued by DR Melford Aase    Hypothyroidism    Inguinal hernia 02/19/2019   Migraines    neurontin helps   Pneumonia    hx of walking pneumonia      Immunization History  Administered Date(s) Administered   Influenza Inj Mdck Quad 01/15/2018   Influenza, High Dose  02/07/2018, 12/30/2018, 02/12/2020   Influenza 01/24/2011, 02/02/2012, 01/16/2015   PFIZER SARS-COV-2 Vacc 05/12/2019, 06/02/2019, 01/22/2020, 07/23/2020   Pneumococcal -13 10/28/2014   Pneumococcal-23 11/28/2011   Td 11/17/2010   Tdap 11/12/2014   Zoster, Live 11/17/2010     Past Surgical History:  Procedure Laterality Date   BILATERAL CATARACT SURGERY      BLERPHOROPLASTY \     CHOLECYSTECTOMY  1996   open   COLONOSCOPY  2009   recommended 10 year f/u   HERNIA REPAIR  1985   umb hernia rpr   HERNIA REPAIR  2005 & 2009   ventral hernia rpr   INGUINAL HERNIA REPAIR Left 04/30/2020   Procedure: OPEN LEFT INGUINAL HERNIA  REPAIR WITH MESH;  Surgeon: Clovis Riley, MD;  Location: [facility];  Service: General;  Laterality: Left;   PILONIDAL CYST EXCISION  1972   SKIN CANCER REMOVAL      TONSILLECTOMY     UPPER GI ENDOSCOPY     VENTRAL HERNIA REPAIR      FHx:    Reviewed / unchanged  SHx:    Reviewed / unchanged   Systems Review:  Constitutional: Denies fever, chills, wt changes, headaches, insomnia, fatigue, night sweats, change in appetite. Eyes: Denies redness, blurred vision, diplopia, discharge, itchy, watery eyes.  ENT: Denies discharge, congestion, post nasal drip, epistaxis, sore throat, earache, hearing loss, dental pain, tinnitus, vertigo, sinus pain, snoring.  CV: Denies chest pain, palpitations, irregular heartbeat, syncope, dyspnea, diaphoresis, orthopnea, PND, claudication or edema. Respiratory: denies cough, dyspnea, DOE, pleurisy, hoarseness, laryngitis, wheezing.  Gastrointestinal: Denies dysphagia, odynophagia, heartburn, reflux, water brash, abdominal pain or cramps, nausea, vomiting, bloating, diarrhea, constipation, hematemesis, melena, hematochezia  or hemorrhoids. Genitourinary: Denies dysuria, frequency, urgency, nocturia, hesitancy, discharge, hematuria or flank pain. Musculoskeletal: Denies arthralgias, myalgias, stiffness, jt. swelling, pain, limping or strain/sprain.  Skin: Denies pruritus, rash, hives, warts, acne, eczema or change in skin lesion(s). Neuro: No weakness, tremor, incoordination, spasms, paresthesia or pain. Psychiatric: Denies confusion, memory loss or sensory loss. Endo: Denies change in weight, skin or hair change.  Heme/Lymph: No excessive bleeding, bruising or enlarged lymph nodes.  Physical Exam  There were no vitals taken for this visit.  Appears  well nourished, well groomed  and in no distress.  Eyes: PERRLA, EOMs, conjunctiva no swelling or erythema. Sinuses: No frontal/maxillary tenderness ENT/Mouth: EAC's clear, TM's nl w/o erythema, bulging.  Nares clear w/o erythema, swelling, exudates. Oropharynx clear without erythema or exudates. Oral hygiene is good. Tongue normal, non obstructing. Hearing intact.  Neck: Supple. Thyroid not palpable. Car 2+/2+ without bruits, nodes or JVD. Chest: Respirations nl with BS clear & equal w/o rales, rhonchi, wheezing or stridor.  Cor: Heart sounds normal w/ regular rate and rhythm without sig. murmurs, gallops, clicks or rubs. Peripheral pulses normal and equal  without edema.  Abdomen: Soft & bowel sounds normal. Non-tender w/o guarding, rebound, hernias, masses or organomegaly.  Lymphatics: Unremarkable.  Musculoskeletal: Full ROM all peripheral extremities, joint stability, 5/5 strength and normal gait.  Skin: Warm, dry without exposed rashes, lesions or ecchymosis apparent.  Neuro: Cranial nerves intact, reflexes equal bilaterally. Sensory-motor testing grossly intact. Tendon reflexes grossly intact.  Pysch: Alert & oriented x 3.  Insight and judgement nl & appropriate. No ideations.  Assessment and Plan:  1. Labile hypertension  - CBC with Differential/Platelet - COMPLETE METABOLIC PANEL WITH GFR - Magnesium - TSH  2. Hyperlipidemia, mixed  - Lipid panel - TSH  3. Abnormal glucose  - Hemoglobin A1c - Insulin, random  4. Vitamin D deficiency  - VITAMIN D 25 Hydroxy   5. Paroxysmal atrial fibrillation (HCC)  - TSH  6. Hypothyroidism  - TSH  7. Stage 3a chronic kidney disease (HCC)  - COMPLETE METABOLIC PANEL WITH GFR  8. Gastroesophageal reflux disease   9. Medication management  - CBC with Differential/Platelet - COMPLETE METABOLIC PANEL WITH GFR - Magnesium - Lipid panel - TSH - Hemoglobin A1c - Insulin, random - VITAMIN D 25 Hydroxy          Discussed  regular exercise, BP monitoring, weight control to achieve/maintain BMI less than 25 and discussed med and SE's. Recommended labs to assess and monitor clinical status with further disposition pending  results of labs.  I discussed the assessment and treatment plan with the patient. The patient was provided an opportunity to ask questions and all were answered. The patient agreed with the plan and demonstrated an understanding of the instructions.  I provided over 30 minutes of exam, counseling, chart review and  complex critical decision making.        The patient was advised to call back or seek an in-person evaluation if the symptoms worsen or if the condition fails to improve as anticipated.   Kirtland Bouchard, MD

## 2022-02-01 NOTE — Patient Instructions (Signed)

## 2022-02-02 LAB — LIPID PANEL
Cholesterol: 169 mg/dL (ref <200)
HDL: 76 mg/dL (ref >=50)
LDL Cholesterol (Calc): 70 mg/dL (calc)
Non-HDL Cholesterol (Calc): 93 mg/dL (calc) (ref <130)
Total CHOL/HDL Ratio: 2.2 (calc) (ref <5.0)
Triglycerides: 148 mg/dL (ref <150)

## 2022-02-02 LAB — COMPLETE METABOLIC PANEL WITH GFR
AG Ratio: 1.7 (calc) (ref 1.0–2.5)
ALT: 14 U/L (ref 6–29)
AST: 18 U/L (ref 10–35)
Albumin: 4.3 g/dL (ref 3.6–5.1)
Alkaline phosphatase (APISO): 78 U/L (ref 37–153)
BUN/Creatinine Ratio: 25 (calc) — ABNORMAL HIGH (ref 6–22)
BUN: 29 mg/dL — ABNORMAL HIGH (ref 7–25)
CO2: 25 mmol/L (ref 20–32)
Calcium: 9.9 mg/dL (ref 8.6–10.4)
Chloride: 105 mmol/L (ref 98–110)
Creat: 1.14 mg/dL — ABNORMAL HIGH (ref 0.60–1.00)
Globulin: 2.5 g/dL (calc) (ref 1.9–3.7)
Glucose, Bld: 109 mg/dL — ABNORMAL HIGH (ref 65–99)
Potassium: 3.6 mmol/L (ref 3.5–5.3)
Sodium: 140 mmol/L (ref 135–146)
Total Bilirubin: 0.6 mg/dL (ref 0.2–1.2)
Total Protein: 6.8 g/dL (ref 6.1–8.1)
eGFR: 50 mL/min/{1.73_m2} — ABNORMAL LOW (ref >=60)

## 2022-02-02 LAB — CBC WITH DIFFERENTIAL/PLATELET
Absolute Monocytes: 605 cells/uL (ref 200–950)
Basophils Absolute: 27 cells/uL (ref 0–200)
Basophils Relative: 0.4 %
Eosinophils Absolute: 82 cells/uL (ref 15–500)
Eosinophils Relative: 1.2 %
HCT: 36.6 % (ref 35.0–45.0)
Hemoglobin: 12.3 g/dL (ref 11.7–15.5)
Lymphs Abs: 1700 cells/uL (ref 850–3900)
MCH: 31.3 pg (ref 27.0–33.0)
MCHC: 33.6 g/dL (ref 32.0–36.0)
MCV: 93.1 fL (ref 80.0–100.0)
MPV: 11.8 fL (ref 7.5–12.5)
Monocytes Relative: 8.9 %
Neutro Abs: 4386 cells/uL (ref 1500–7800)
Neutrophils Relative %: 64.5 %
Platelets: 69 10*3/uL — ABNORMAL LOW (ref 140–400)
RBC: 3.93 10*6/uL (ref 3.80–5.10)
RDW: 12.8 % (ref 11.0–15.0)
Total Lymphocyte: 25 %
WBC: 6.8 10*3/uL (ref 3.8–10.8)

## 2022-02-02 LAB — TSH: TSH: 1.63 mIU/L (ref 0.40–4.50)

## 2022-02-02 LAB — HEMOGLOBIN A1C
Hgb A1c MFr Bld: 5.6 % of total Hgb (ref <5.7)
Mean Plasma Glucose: 114 mg/dL
eAG (mmol/L): 6.3 mmol/L

## 2022-02-02 LAB — VITAMIN D 25 HYDROXY (VIT D DEFICIENCY, FRACTURES): Vit D, 25-Hydroxy: 86 ng/mL (ref 30–100)

## 2022-02-02 LAB — MAGNESIUM: Magnesium: 2.1 mg/dL (ref 1.5–2.5)

## 2022-02-02 LAB — INSULIN, RANDOM: Insulin: 47.4 u[IU]/mL — ABNORMAL HIGH

## 2022-02-03 NOTE — Progress Notes (Signed)
<><><><><><><><><><><><><><><><><><><><><><><><><><><><><><><><><> <><><><><><><><><><><><><><><><><><><><><><><><><><><><><><><><><>  -   Kidney functions stable in Stage 3a  & OK  <><><><><><><><><><><><><><><><><><><><><><><><><><><><><><><><><> <><><><><><><><><><><><><><><><><><><><><><><><><><><><><><><><><>  -  Total Chol = 169   & LDL Chol = 70  - Both  Excellent   - Very low risk for Heart Attack  / Stroke <><><><><><><><><><><><><><><><><><><><><><><><><><><><><><><><><> <><><><><><><><><><><><><><><><><><><><><><><><><><><><><><><><><>  - A1c - Normal - No Diabetes  - Great !  <><><><><><><><><><><><><><><><><><><><><><><><><><><><><><><><><> <><><><><><><><><><><><><><><><><><><><><><><><><><><><><><><><><>  - Vitamin D = 86  - Excellent   - Please keep dose same   <><><><><><><><><><><><><><><><><><><><><><><><><><><><><><><><><> <><><><><><><><><><><><><><><><><><><><><><><><><><><><><><><><><>  - All Else - CBC - Electrolytes - Liver - Magnesium & Thyroid    - all  Normal / OK <><><><><><><><><><><><><><><><><><><><><><><><><><><><><><><><><> <><><><><><><><><><><><><><><><><><><><><><><><><><><><><><><><><>

## 2022-02-03 NOTE — Progress Notes (Signed)
Patient is aware of lab results. -e welch

## 2022-02-06 DIAGNOSIS — N281 Cyst of kidney, acquired: Secondary | ICD-10-CM | POA: Diagnosis not present

## 2022-02-06 DIAGNOSIS — N183 Chronic kidney disease, stage 3 unspecified: Secondary | ICD-10-CM | POA: Diagnosis not present

## 2022-02-06 DIAGNOSIS — I129 Hypertensive chronic kidney disease with stage 1 through stage 4 chronic kidney disease, or unspecified chronic kidney disease: Secondary | ICD-10-CM | POA: Diagnosis not present

## 2022-02-06 DIAGNOSIS — Z6827 Body mass index (BMI) 27.0-27.9, adult: Secondary | ICD-10-CM | POA: Diagnosis not present

## 2022-02-07 ENCOUNTER — Other Ambulatory Visit: Payer: Self-pay | Admitting: Nephrology

## 2022-02-07 DIAGNOSIS — N183 Chronic kidney disease, stage 3 unspecified: Secondary | ICD-10-CM

## 2022-02-08 ENCOUNTER — Inpatient Hospital Stay: Admission: RE | Admit: 2022-02-08 | Payer: Medicare PPO | Source: Ambulatory Visit

## 2022-02-13 ENCOUNTER — Ambulatory Visit
Admission: RE | Admit: 2022-02-13 | Discharge: 2022-02-13 | Disposition: A | Discharge status: Home / Self Care | Payer: Medicare PPO | Source: Ambulatory Visit | Attending: Nephrology | Admitting: Nephrology

## 2022-02-13 DIAGNOSIS — N189 Chronic kidney disease, unspecified: Secondary | ICD-10-CM | POA: Diagnosis not present

## 2022-02-13 DIAGNOSIS — N281 Cyst of kidney, acquired: Secondary | ICD-10-CM | POA: Diagnosis not present

## 2022-02-13 DIAGNOSIS — N183 Chronic kidney disease, stage 3 unspecified: Secondary | ICD-10-CM

## 2022-02-14 ENCOUNTER — Encounter: Payer: Self-pay | Admitting: Hematology and Oncology

## 2022-02-14 ENCOUNTER — Other Ambulatory Visit: Payer: Self-pay

## 2022-02-14 ENCOUNTER — Inpatient Hospital Stay: Payer: Medicare PPO | Attending: Hematology and Oncology | Admitting: Hematology and Oncology

## 2022-02-14 ENCOUNTER — Inpatient Hospital Stay: Payer: Medicare PPO

## 2022-02-14 VITALS — BP 134/96 | HR 66 | Temp 97.8°F | Resp 16 | Ht 64.0 in | Wt 149.0 lb

## 2022-02-14 DIAGNOSIS — D509 Iron deficiency anemia, unspecified: Secondary | ICD-10-CM

## 2022-02-14 DIAGNOSIS — D696 Thrombocytopenia, unspecified: Secondary | ICD-10-CM | POA: Diagnosis not present

## 2022-02-14 LAB — CBC WITH DIFFERENTIAL (CANCER CENTER ONLY)
Abs Immature Granulocytes: 0.04 10*3/uL (ref 0.00–0.07)
Basophils Absolute: 0 10*3/uL (ref 0.0–0.1)
Basophils Relative: 1 %
Eosinophils Absolute: 0.1 10*3/uL (ref 0.0–0.5)
Eosinophils Relative: 2 %
HCT: 37.9 % (ref 36.0–46.0)
Hemoglobin: 12.8 g/dL (ref 12.0–15.0)
Immature Granulocytes: 1 %
Lymphocytes Relative: 29 %
Lymphs Abs: 1.8 10*3/uL (ref 0.7–4.0)
MCH: 31.5 pg (ref 26.0–34.0)
MCHC: 33.8 g/dL (ref 30.0–36.0)
MCV: 93.3 fL (ref 80.0–100.0)
Monocytes Absolute: 0.6 10*3/uL (ref 0.1–1.0)
Monocytes Relative: 10 %
Neutro Abs: 3.5 10*3/uL (ref 1.7–7.7)
Neutrophils Relative %: 57 %
Platelet Count: 72 10*3/uL — ABNORMAL LOW (ref 150–400)
RBC: 4.06 MIL/uL (ref 3.87–5.11)
RDW: 12.3 % (ref 11.5–15.5)
WBC Count: 6.1 10*3/uL (ref 4.0–10.5)
nRBC: 0 % (ref 0.0–0.2)

## 2022-02-14 NOTE — Progress Notes (Signed)
[place] [phone]   [phone]  PROGRESS NOTE  Patient Care Team: Unk Pinto, MD as PCP - General (Internal Medicine) Belva Crome, MD as PCP - Cardiology (Cardiology) Corliss Parish, [name]   CHIEF COMPLAINTS/PURPOSE OF CONSULTATION:  Thrombocytopenia  HISTORY OF PRESENTING ILLNESS:  Jillian Rodgers 76 y.o. female returns for a follow up for thrombocytopenia.    MEDICAL HISTORY:  Past Medical History:  Diagnosis Date   Anemia with low platelet count (HCC)    Chronic kidney disease    Degenerative joint disease    Dysrhythmia    a fib   GERD (gastroesophageal reflux disease)    History of kidney stones    Hyperlipidemia    Hypothyroidism    Inguinal hernia 02/19/2019   Labile hypertension    Migraines    neurontin helps   Pneumonia    hx of walking pneumonia     SURGICAL HISTORY: Past Surgical History:  Procedure Laterality Date   BILATERAL CATARACT SURGERY      BLERPHOROPLASTY \     CHOLECYSTECTOMY  1996   open   COLONOSCOPY  2009   recommended 10 year f/u   [place]   umb hernia rpr   HERNIA REPAIR  2005 & 2009   ventral hernia rpr   INGUINAL HERNIA REPAIR Left 04/30/2020   Procedure: OPEN LEFT INGUINAL HERNIA REPAIR WITH MESH;  Surgeon: Clovis Riley, MD;  Location: [facility];  Service: General;  Laterality: Left;   INGUINAL HERNIA REPAIR Right 10/24/2021   Procedure: OPEN RIGHT INGUINAL HERNIA REPAIR WITH MESH ;  Surgeon: Clovis Riley, MD;  Location: [facility];  Service: General;  Laterality: Right;   PILONIDAL CYST EXCISION  1972   SKIN CANCER REMOVAL      TONSILLECTOMY     UPPER GI ENDOSCOPY     VENTRAL HERNIA REPAIR      SOCIAL HISTORY: Social History   Socioeconomic History   Marital status: Married    Spouse name: Not on file   Number of children: 2   Years of education: 18   Highest education level: Not on file  Occupational History    Not on file  Tobacco Use   Smoking status: Never   Smokeless tobacco: Never  Vaping Use   Vaping Use: Never used  Substance and Sexual Activity   Alcohol use: No   Drug use: No   Sexual activity: Yes  Other Topics Concern   Not on file  Social History Narrative   Not on file   Social Determinants of Health   Financial Resource Strain: Not on file  Food Insecurity: Not on file  Transportation Needs: Not on file  Physical Activity: Not on file  Stress: Not on file  Social Connections: Not on file  Intimate Partner Violence: Not on file    FAMILY HISTORY: Family History  Problem Relation Age of Onset   Ovarian cancer Mother    Heart disease Mother    Cancer Mother    Uterine cancer Mother    Hypertension Mother    Breast cancer Sister    Stroke Paternal Uncle    Congestive Heart Failure Father    Gout Maternal Aunt    Colon cancer Neg Hx    Esophageal cancer Neg Hx    Stomach cancer Neg Hx    Rectal cancer Neg Hx     ALLERGIES:  has No Known Allergies.  MEDICATIONS:  Current Outpatient Medications  Medication Sig Dispense Refill   acetaminophen (TYLENOL) 325 MG tablet Take 650 mg by mouth every 6 (six) hours as needed for moderate pain or headache.     Ascorbic Acid (VITAMIN C) 500 MG CAPS Take 500 mg by mouth daily.      BOTOX 100 units SOLR injection Inject 100 Units into the muscle See admin instructions. Inject 100 units intramuscularly every 6-8 weeks     cetirizine (ZYRTEC) 10 MG tablet Take 10 mg by mouth daily.     Cholecalciferol (VITAMIN D) 125 MCG (5000 UT) CAPS Take 5,000 Units by mouth daily.     gabapentin (NEURONTIN) 600 MG tablet Take 600 mg by mouth See admin instructions. Take 600 mg by mouth in morning and 1200 mg at bedtime     levothyroxine (SYNTHROID) 50 MCG tablet TAKE 1 TABLET BY MOUTH DAILY ON A EMPTY STOMACH WITH WATER. DO NOT TAKE ANY ANTACID, CALCIUM OR MAGNESIUM FOR 4 HOURS. AVOID BIOTIN 90 tablet 3   pantoprazole (PROTONIX) 40 MG  tablet Take  1 tablet  2 x /day  for Acid Indigestion & Heartburn 180 tablet 3   Polyethyl Glycol-Propyl Glycol (SYSTANE ULTRA) 0.4-0.3 % SOLN Place 1 drop into both eyes 3 (three) times daily as needed (dry eyes).     pravastatin (PRAVACHOL) 40 MG tablet TAKE 1 TABLET BY MOUTH AT BEDTIME FOR CHOLESTEROL 90 tablet 3   Propylene Glycol (SYSTANE BALANCE) 0.6 % SOLN Place 1 drop into both eyes at bedtime.     zinc gluconate 50 MG tablet Take 50 mg by mouth daily.     No current facility-administered medications for this visit.    REVIEW OF SYSTEMS:   Constitutional: ( - ) fevers, ( - )  chills , ( - ) night sweats Eyes: ( - ) blurriness of vision, ( - ) double vision, ( - ) watery eyes Ears, nose, mouth, throat, and face: ( - ) mucositis, ( - ) sore throat Respiratory: ( - ) cough, ( - ) dyspnea, ( - ) wheezes Cardiovascular: ( - ) palpitation, ( - ) chest discomfort, ( - ) lower extremity swelling Gastrointestinal:  ( - ) nausea, ( - ) heartburn, ( - ) change in bowel habits Skin: ( - ) abnormal skin rashes Lymphatics: ( - ) new lymphadenopathy, ( - ) easy bruising Neurological: ( - ) numbness, ( - ) tingling, ( - ) new weaknesses Behavioral/Psych: ( - ) mood change, ( - ) new changes  All other systems were reviewed with the patient and are negative.  PHYSICAL EXAMINATION: ECOG PERFORMANCE STATUS: 0 - Asymptomatic  Vitals:   02/14/22 1200  BP: (!) 134/96  Pulse: 66  Resp: 16  Temp: 97.8 F (36.6 C)  SpO2: 99%   Filed Weights   02/14/22 1200  Weight: 149 lb (67.6 kg)    Physical Exam Constitutional:      Appearance: Normal appearance.  Cardiovascular:     Rate and Rhythm: Normal rate and regular rhythm.     Pulses: Normal pulses.     Heart sounds: Normal heart sounds.  Pulmonary:     Effort: Pulmonary effort is normal.     Breath sounds: Normal breath sounds.  Abdominal:     General: Abdomen is flat. Bowel sounds are normal.     Palpations: Abdomen is soft.   Musculoskeletal:        General: No swelling. Normal range of motion.     Cervical back:  Normal range of motion and neck supple. No rigidity.  Lymphadenopathy:     Cervical: No cervical adenopathy.  Skin:    General: Skin is warm and dry.  Neurological:     General: No focal deficit present.     Mental Status: She is alert.      LABORATORY DATA:  I have reviewed the data as listed    Latest Ref Rng & Units 02/14/2022   11:46 AM 02/01/2022    2:42 PM 01/02/2022   10:53 AM  CBC  WBC 4.0 - 10.5 K/uL 6.1  6.8  6.2   Hemoglobin 12.0 - 15.0 g/dL 12.8  12.3  12.7   Hematocrit 36.0 - 46.0 % 37.9  36.6  38.3   Platelets 150 - 400 K/uL 72  69  48        Latest Ref Rng & Units 02/01/2022    2:42 PM 10/20/2021    3:35 PM 07/18/2021    3:32 PM  CMP  Glucose 65 - 99 mg/dL 109  102  56   BUN 7 - 25 mg/dL _0 Creatinine 0.60 - 1.00 mg/dL 1.14  1.11  0.97   Sodium 135 - 146 mmol/L 140  142  142   Potassium 3.5 - 5.3 mmol/L 3.6  4.2  4.1   Chloride 98 - 110 mmol/L 105  108  107   CO2 20 - 32 mmol/L _1 Calcium 8.6 - 10.4 mg/dL 9.9  9.9  9.5   Total Protein 6.1 - 8.1 g/dL 6.8  6.4  6.0   Total Bilirubin 0.2 - 1.2 mg/dL 0.6  0.5  0.3   AST 10 - 35 U/L _2 ALT 6 - 29 U/L _3 ASSESSMENT & PLAN Jillian Rodgers is a 76 y.o. female who presents for a follow up for thrombocytopenia.   #Thrombocytopenia: --Patient has undergone serologic workup on November 2021 without no evidence of hepatitis, hypothyroidism, nutritional deficiencies.  --MR of the abdomen from May 2021 shows no evidence of liver disease or splenomegaly.  --Bone marrow biopsy from 03/19/2020 showed hypercellularity, no evidence of dyspoeisis, megakaryocytic hyperplasia.  --Flow cytometry negative for any monoclonal B cell population. Cytogenetics was normal.  --  No orders of the defined types were placed in this encounter.  All questions were answered. The patient knows to call  the clinic with any problems, questions or concerns.  I have spent a total of 20  minutes minutes of face-to-face and non-face-to-face time, preparing to see the patient, performing a medically appropriate examination, counseling and educating the patient, documenting clinical information in the electronic health record, and care coordination.   Benay Pike MD

## 2022-02-15 ENCOUNTER — Encounter: Payer: Self-pay | Admitting: Hematology and Oncology

## 2022-02-15 NOTE — Progress Notes (Signed)
[facility] [phone]   [phone]  PROGRESS NOTE  Patient Care Team: Unk Pinto, MD as PCP - General (Internal Medicine) Belva Crome, MD as PCP - Cardiology (Cardiology) Corliss Parish, [name]   CHIEF COMPLAINTS/PURPOSE OF CONSULTATION:  Thrombocytopenia  HISTORY OF PRESENTING ILLNESS:  Jillian Rodgers 76 y.o. female returns for a follow up for thrombocytopenia.   Ms. Modisette is here for follow-up.  Since last visit, she has stopped blood thinners and is currently just being monitored by cardiology.  She is otherwise doing well.  She denies any new health complaints.  No fevers, drenching night sweats, loss of appetite or weight.  No hematochezia or melena.  No gingival bleeding or epistaxis. Rest of the pertinent 10 point ROS reviewed and negative  MEDICAL HISTORY:  Past Medical History:  Diagnosis Date   Anemia with low platelet count (HCC)    Chronic kidney disease    Degenerative joint disease    Dysrhythmia    a fib   GERD (gastroesophageal reflux disease)    History of kidney stones    Hyperlipidemia    Hypothyroidism    Inguinal hernia 02/19/2019   Labile hypertension    Migraines    neurontin helps   Pneumonia    hx of walking pneumonia     SURGICAL HISTORY: Past Surgical History:  Procedure Laterality Date   BILATERAL CATARACT SURGERY      BLERPHOROPLASTY \     CHOLECYSTECTOMY  1996   open   COLONOSCOPY  2009   recommended 10 year f/u   HERNIA REPAIR  1985   umb hernia rpr   HERNIA REPAIR  2005 & 2009   ventral hernia rpr   INGUINAL HERNIA REPAIR Left 04/30/2020   Procedure: OPEN LEFT INGUINAL HERNIA REPAIR WITH MESH;  Surgeon: Clovis Riley, MD;  Location: [facility];  Service: General;  Laterality: Left;   INGUINAL HERNIA REPAIR Right 10/24/2021   Procedure: OPEN RIGHT INGUINAL HERNIA REPAIR WITH MESH ;  Surgeon: Clovis Riley, MD;  Location: [facility];  Service:  General;  Laterality: Right;   PILONIDAL CYST EXCISION  1972   SKIN CANCER REMOVAL      TONSILLECTOMY     UPPER GI ENDOSCOPY     VENTRAL HERNIA REPAIR      SOCIAL HISTORY: Social History   Socioeconomic History   Marital status: Married    Spouse name: Not on file   Number of children: 2   Years of education: 18   Highest education level: Not on file  Occupational History   Not on file  Tobacco Use   Smoking status: Never   Smokeless tobacco: Never  Vaping Use   Vaping Use: Never used  Substance and Sexual Activity   Alcohol use: No   Drug use: No   Sexual activity: Yes  Other Topics Concern   Not on file  Social History Narrative   Not on file   Social Determinants of Health   Financial Resource Strain: Not on file  Food Insecurity: Not on file  Transportation Needs: Not on file  Physical Activity: Not on file  Stress: Not on file  Social Connections: Not on file  Intimate Partner Violence: Not on file    FAMILY HISTORY: Family History  Problem Relation Age of Onset   Ovarian cancer Mother    Heart disease Mother    Cancer Mother    Uterine cancer Mother  Hypertension Mother    Breast cancer Sister    Stroke Paternal Uncle    Congestive Heart Failure Father    Gout Maternal Aunt    Colon cancer Neg Hx    Esophageal cancer Neg Hx    Stomach cancer Neg Hx    Rectal cancer Neg Hx     ALLERGIES:  has No Known Allergies.  MEDICATIONS:  Current Outpatient Medications  Medication Sig Dispense Refill   acetaminophen (TYLENOL) 325 MG tablet Take 650 mg by mouth every 6 (six) hours as needed for moderate pain or headache.     Ascorbic Acid (VITAMIN C) 500 MG CAPS Take 500 mg by mouth daily.      BOTOX 100 units SOLR injection Inject 100 Units into the muscle See admin instructions. Inject 100 units intramuscularly every 6-8 weeks     cetirizine (ZYRTEC) 10 MG tablet Take 10 mg by mouth daily.     Cholecalciferol (VITAMIN D) 125 MCG (5000 UT) CAPS Take  5,000 Units by mouth daily.     gabapentin (NEURONTIN) 600 MG tablet Take 600 mg by mouth See admin instructions. Take 600 mg by mouth in morning and 1200 mg at bedtime     levothyroxine (SYNTHROID) 50 MCG tablet TAKE 1 TABLET BY MOUTH DAILY ON A EMPTY STOMACH WITH WATER. DO NOT TAKE ANY ANTACID, CALCIUM OR MAGNESIUM FOR 4 HOURS. AVOID BIOTIN 90 tablet 3   pantoprazole (PROTONIX) 40 MG tablet Take  1 tablet  2 x /day  for Acid Indigestion & Heartburn 180 tablet 3   Polyethyl Glycol-Propyl Glycol (SYSTANE ULTRA) 0.4-0.3 % SOLN Place 1 drop into both eyes 3 (three) times daily as needed (dry eyes).     pravastatin (PRAVACHOL) 40 MG tablet TAKE 1 TABLET BY MOUTH AT BEDTIME FOR CHOLESTEROL 90 tablet 3   Propylene Glycol (SYSTANE BALANCE) 0.6 % SOLN Place 1 drop into both eyes at bedtime.     zinc gluconate 50 MG tablet Take 50 mg by mouth daily.     No current facility-administered medications for this visit.    REVIEW OF SYSTEMS:   Constitutional: ( - ) fevers, ( - )  chills , ( - ) night sweats Eyes: ( - ) blurriness of vision, ( - ) double vision, ( - ) watery eyes Ears, nose, mouth, throat, and face: ( - ) mucositis, ( - ) sore throat Respiratory: ( - ) cough, ( - ) dyspnea, ( - ) wheezes Cardiovascular: ( - ) palpitation, ( - ) chest discomfort, ( - ) lower extremity swelling Gastrointestinal:  ( - ) nausea, ( - ) heartburn, ( - ) change in bowel habits Skin: ( - ) abnormal skin rashes Lymphatics: ( - ) new lymphadenopathy, ( - ) easy bruising Neurological: ( - ) numbness, ( - ) tingling, ( - ) new weaknesses Behavioral/Psych: ( - ) mood change, ( - ) new changes  All other systems were reviewed with the patient and are negative.  PHYSICAL EXAMINATION: ECOG PERFORMANCE STATUS: 0 - Asymptomatic  Vitals:   02/14/22 1200  BP: (!) 134/96  Pulse: 66  Resp: 16  Temp: 97.8 F (36.6 C)  SpO2: 99%   Filed Weights   02/14/22 1200  Weight: 149 lb (67.6 kg)    Physical  Exam Constitutional:      Appearance: Normal appearance.  Cardiovascular:     Rate and Rhythm: Normal rate and regular rhythm.     Pulses: Normal pulses.     Heart  sounds: Normal heart sounds.  Pulmonary:     Effort: Pulmonary effort is normal.     Breath sounds: Normal breath sounds.  Abdominal:     General: Abdomen is flat. Bowel sounds are normal.     Palpations: Abdomen is soft.  Musculoskeletal:        General: No swelling. Normal range of motion.     Cervical back: Normal range of motion and neck supple. No rigidity.  Lymphadenopathy:     Cervical: No cervical adenopathy.  Skin:    General: Skin is warm and dry.  Neurological:     General: No focal deficit present.     Mental Status: She is alert.      LABORATORY DATA:  I have reviewed the data as listed    Latest Ref Rng & Units 02/14/2022   11:46 AM 02/01/2022    2:42 PM 01/02/2022   10:53 AM  CBC  WBC 4.0 - 10.5 K/uL 6.1  6.8  6.2   Hemoglobin 12.0 - 15.0 g/dL 12.8  12.3  12.7   Hematocrit 36.0 - 46.0 % 37.9  36.6  38.3   Platelets 150 - 400 K/uL 72  69  48        Latest Ref Rng & Units 02/01/2022    2:42 PM 10/20/2021    3:35 PM 07/18/2021    3:32 PM  CMP  Glucose 65 - 99 mg/dL 109  102  56   BUN 7 - 25 mg/dL _0 Creatinine 0.60 - 1.00 mg/dL 1.14  1.11  0.97   Sodium 135 - 146 mmol/L 140  142  142   Potassium 3.5 - 5.3 mmol/L 3.6  4.2  4.1   Chloride 98 - 110 mmol/L 105  108  107   CO2 20 - 32 mmol/L _1 Calcium 8.6 - 10.4 mg/dL 9.9  9.9  9.5   Total Protein 6.1 - 8.1 g/dL 6.8  6.4  6.0   Total Bilirubin 0.2 - 1.2 mg/dL 0.6  0.5  0.3   AST 10 - 35 U/L _2 ALT 6 - 29 U/L _3 ASSESSMENT & PLAN CARYNN FELLING is a 76 y.o. female who presents for a follow up for thrombocytopenia.   #Thrombocytopenia:  --Patient has undergone serologic workup on November 2021 without no evidence of hepatitis, hypothyroidism, nutritional deficiencies.  --MR of the abdomen from  May 2021 shows no evidence of liver disease or splenomegaly.  --Bone marrow biopsy from 03/19/2020 showed hypercellularity, no evidence of dyspoeisis, megakaryocytic hyperplasia.  --Flow cytometry negative for any monoclonal B cell population. Cytogenetics was normal.  --Labs from today show platelet count of 50,000 --Likely etiology is immune mediated versus early bone marrow disorder.  --She is not on any blood thinners at this time.  Most recent labs with platelet count of 70,000 which is her baseline.  -- We have once again discussed about adverse effects of thrombocytopenia.  At this time since she is not on anticoagulation anymore, we have discussed about returning to clinic in 6 months or sooner as needed and she is okay with this plan. No orders of the defined types were placed in this encounter.  All questions were answered. The patient knows to call the clinic with any problems, questions or concerns.  I have spent a total of 20  minutes minutes of face-to-face and  non-face-to-face time, preparing to see the patient, performing a medically appropriate examination, counseling and educating the patient, documenting clinical information in the electronic health record, and care coordination.   Benay Pike MD

## 2022-02-27 DIAGNOSIS — H524 Presbyopia: Secondary | ICD-10-CM | POA: Diagnosis not present

## 2022-02-27 DIAGNOSIS — Z961 Presence of intraocular lens: Secondary | ICD-10-CM | POA: Diagnosis not present

## 2022-03-15 DIAGNOSIS — M791 Myalgia, unspecified site: Secondary | ICD-10-CM | POA: Diagnosis not present

## 2022-03-15 DIAGNOSIS — G518 Other disorders of facial nerve: Secondary | ICD-10-CM | POA: Diagnosis not present

## 2022-03-15 DIAGNOSIS — M542 Cervicalgia: Secondary | ICD-10-CM | POA: Diagnosis not present

## 2022-03-15 DIAGNOSIS — G43019 Migraine without aura, intractable, without status migrainosus: Secondary | ICD-10-CM | POA: Diagnosis not present

## 2022-03-22 DIAGNOSIS — Z419 Encounter for procedure for purposes other than remedying health state, unspecified: Secondary | ICD-10-CM | POA: Diagnosis not present

## 2022-03-22 DIAGNOSIS — Z85828 Personal history of other malignant neoplasm of skin: Secondary | ICD-10-CM | POA: Diagnosis not present

## 2022-03-22 DIAGNOSIS — L300 Nummular dermatitis: Secondary | ICD-10-CM | POA: Diagnosis not present

## 2022-03-31 ENCOUNTER — Inpatient Hospital Stay: Payer: Medicare PPO

## 2022-04-03 ENCOUNTER — Encounter: Payer: Self-pay | Admitting: Internal Medicine

## 2022-04-15 ENCOUNTER — Emergency Department (HOSPITAL_BASED_OUTPATIENT_CLINIC_OR_DEPARTMENT_OTHER)
Admission: EM | Admit: 2022-04-15 | Discharge: 2022-04-15 | Discharge status: Against Medical Advice | Payer: Medicare PPO | Attending: Emergency Medicine | Admitting: Emergency Medicine

## 2022-04-15 ENCOUNTER — Encounter (HOSPITAL_BASED_OUTPATIENT_CLINIC_OR_DEPARTMENT_OTHER): Payer: Self-pay | Admitting: Emergency Medicine

## 2022-04-15 DIAGNOSIS — Z5321 Procedure and treatment not carried out due to patient leaving prior to being seen by health care provider: Secondary | ICD-10-CM | POA: Diagnosis not present

## 2022-04-15 DIAGNOSIS — R21 Rash and other nonspecific skin eruption: Secondary | ICD-10-CM | POA: Insufficient documentation

## 2022-04-15 NOTE — ED Triage Notes (Signed)
Pt  here from home with c/o left lower leg after applying  some some triamcinolone cram , area has a small rash to the area

## 2022-04-20 ENCOUNTER — Encounter: Payer: Self-pay | Admitting: Internal Medicine

## 2022-04-20 ENCOUNTER — Ambulatory Visit: Payer: Medicare PPO | Admitting: Internal Medicine

## 2022-04-20 VITALS — BP 138/80 | HR 81 | Temp 97.9°F | Resp 17 | Ht 64.0 in | Wt 155.2 lb

## 2022-04-20 DIAGNOSIS — M542 Cervicalgia: Secondary | ICD-10-CM | POA: Diagnosis not present

## 2022-04-20 DIAGNOSIS — G518 Other disorders of facial nerve: Secondary | ICD-10-CM | POA: Diagnosis not present

## 2022-04-20 DIAGNOSIS — G43019 Migraine without aura, intractable, without status migrainosus: Secondary | ICD-10-CM | POA: Diagnosis not present

## 2022-04-20 DIAGNOSIS — R21 Rash and other nonspecific skin eruption: Secondary | ICD-10-CM

## 2022-04-20 DIAGNOSIS — M791 Myalgia, unspecified site: Secondary | ICD-10-CM | POA: Diagnosis not present

## 2022-04-20 MED ORDER — DEXAMETHASONE 4 MG PO TABS: ORAL_TABLET | ORAL | 0 refills | Status: DC

## 2022-04-20 MED ORDER — CLOBETASOL PROPIONATE 0.05 % EX OINT: 1.0000 | TOPICAL_OINTMENT | Freq: Two times a day (BID) | CUTANEOUS | 3 refills | Status: DC

## 2022-04-20 MED ORDER — CLOBETASOL PROPIONATE 0.05 % EX OINT
TOPICAL_OINTMENT | CUTANEOUS | 3 refills | Status: DC
Start: 2022-04-20 — End: 2022-09-26

## 2022-04-20 NOTE — Progress Notes (Signed)
Future Appointments  Date Time Provider Department  04/20/2022  3:30 PM Unk Pinto, MD [facility]  05/10/2022  2:30 PM Darrol Jump, NP [facility]  06/29/2022 11:45 AM Benay Pike, MD [facility]  09/13/2022 11:00 AM Unk Pinto, MD [facility]    History of Present Illness:    Patient is a very nice 77 yo MWF who presents for concerns of a possible allergic rx to to [place] cream prescribed by her Dermatologist for a rash on her Left shin.  Medications  Current Outpatient Medications (Endocrine & Metabolic):    levothyroxine (SYNTHROID) 50 MCG tablet, TAKE 1 TABLET BY MOUTH DAILY ON A EMPTY STOMACH WITH WATER. DO NOT TAKE ANY ANTACID, CALCIUM OR MAGNESIUM FOR 4 HOURS. AVOID BIOTIN  Current Outpatient Medications (Cardiovascular):    pravastatin (PRAVACHOL) 40 MG tablet, TAKE 1 TABLET BY MOUTH AT BEDTIME FOR CHOLESTEROL  Current Outpatient Medications (Respiratory):    cetirizine (ZYRTEC) 10 MG tablet, Take 10 mg by mouth daily.  Current Outpatient Medications (Analgesics):    acetaminophen (TYLENOL) 325 MG tablet, Take 650 mg by mouth every 6 (six) hours as needed for moderate pain or headache.   Current Outpatient Medications (Other):    Ascorbic Acid (VITAMIN C) 500 MG CAPS, Take 500 mg by mouth daily.    BOTOX 100 units SOLR injection, Inject 100 Units into the muscle See admin instructions. Inject 100 units intramuscularly every 6-8 weeks   Cholecalciferol (VITAMIN D) 125 MCG (5000 UT) CAPS, Take 5,000 Units by mouth daily.   gabapentin (NEURONTIN) 600 MG tablet, Take 600 mg by mouth See admin instructions. Take 600 mg by mouth in morning and 1200 mg at bedtime   pantoprazole (PROTONIX) 40 MG tablet, Take  1 tablet  2 x /day  for Acid Indigestion & Heartburn   Polyethyl Glycol-Propyl Glycol (SYSTANE ULTRA) 0.4-0.3 % SOLN, Place 1 drop into both eyes 3 (three) times daily as needed (dry eyes).   Propylene Glycol (SYSTANE BALANCE) 0.6 % SOLN, Place 1 drop  into both eyes at bedtime.   zinc gluconate 50 MG tablet, Take 50 mg by mouth daily.  Problem list She has Labile hypertension; Degenerative joint disease; GERD (gastroesophageal reflux disease); Hyperlipidemia; Hypothyroidism; Vitamin D deficiency; Medication management; Upper airway cough syndrome; CKD (chronic kidney disease) stage 3, GFR 30-59 ml/min ([place]); Overweight (BMI 25.0-29.9); Encounter for Medicare annual wellness exam; Thrombocytopenia ([place]); IDA (iron deficiency anemia); Increased urinary frequency; Localized osteoporosis without current pathological fracture; Paroxysmal atrial fibrillation ([place]); Secondary hypercoagulable state ([place]); and Snoring on their problem list.   Observations/Objective:  BP 138/80   Pulse 81   Temp 97.9 F (36.6 C)   Resp 17   Ht '5\' 4"'$  (1.626 m)   Wt 155 lb 3.2 oz (70.4 kg)   SpO2 99%   BMI 26.64 kg/m      Skin focused exam of Left anterior shin finds a scaly reticular "lacy" dry rash .   Assessment and Plan:  1. Rash and other nonspecific skin eruption  - dexamethasone 4 MG tablet;  Take 1 tab 3 x day - 3 days, then 2 x day - 3 days, then 1 tab daily   Dispense: 20 tablet; Refill: 0  - clobetasol ointment (TEMOVATE) 0.05 %;  Apply to rash on shin  Daily  and cover with Saran wrap-   Dispense: 60 g; Refill: 3   Follow Up Instructions:        I discussed the assessment and treatment plan with the patient. The patient  was provided an opportunity to ask questions and all were answered. The patient agreed with the plan and demonstrated an understanding of the instructions.       The patient was advised to call back or seek an in-person evaluation if the symptoms worsen or if the condition fails to improve as anticipated.    Kirtland Bouchard, MD

## 2022-04-24 DIAGNOSIS — I7091 Generalized atherosclerosis: Secondary | ICD-10-CM | POA: Diagnosis not present

## 2022-04-24 DIAGNOSIS — L602 Onychogryphosis: Secondary | ICD-10-CM | POA: Diagnosis not present

## 2022-04-27 NOTE — Progress Notes (Signed)
Cardiology Office Note:    Date:  04/28/2022   ID:  Jillian Rodgers, DOB 1946/02/29, MRN [id number]  PCP:  Jillian Rodgers, [place] [facility] Cardiologist:  Jillian Lean, MD     Referring MD: Jillian Pinto, MD   CC: Transition to new cardiologist  History of Present Illness:    Jillian Rodgers is a 77 y.o. female with a hx of HTN, HLD, OSA not on CPAP, CKD, PAF on eliquis with last DCCV 2022; with stopping AC after this in the setting of thrombocytopenia and with low platelets (sees Dr. Chryl Rodgers).  Patient notes that she is doing OK.   Recent ED eval for late fall hematoma after mechanical fall; saw Dr. Melford Rodgers since for skin discoloration.  No chest pain or pressure .  No SOB/DOE and no PND/Orthopnea.  No weight gain or leg swelling.  No palpitations or syncope.  In the past she was symptomatic when she is in a fib (palpitations with SOB).  Ambulatory blood pressure is quite variable.  One month prior had BP 70/50 and was symptomatic.  There were concerns of vasovagal syncope.   Past Medical History:  Diagnosis Date   Anemia with low platelet count (HCC)    Chronic kidney disease    Degenerative joint disease    Dysrhythmia    a fib   GERD (gastroesophageal reflux disease)    History of kidney stones    Hyperlipidemia    Hypothyroidism    Inguinal hernia 02/19/2019   Labile hypertension    Migraines    neurontin helps   Pneumonia    hx of walking pneumonia    Primary hypertension 04/28/2022    Past Surgical History:  Procedure Laterality Date   BILATERAL CATARACT SURGERY      BLERPHOROPLASTY \     CHOLECYSTECTOMY  1996   open   COLONOSCOPY  2009   recommended 10 year f/u   HERNIA REPAIR  1985   umb hernia rpr   HERNIA REPAIR  2005 & 2009   ventral hernia rpr   INGUINAL HERNIA REPAIR Left 04/30/2020   Procedure: OPEN LEFT INGUINAL HERNIA REPAIR WITH MESH;  Surgeon: Jillian Riley, MD;  Location: [facility];  Service: General;   Laterality: Left;   INGUINAL HERNIA REPAIR Right 10/24/2021   Procedure: OPEN RIGHT INGUINAL HERNIA REPAIR WITH MESH ;  Surgeon: Jillian Riley, MD;  Location: [facility];  Service: General;  Laterality: Right;   PILONIDAL CYST EXCISION  1972   SKIN CANCER REMOVAL      TONSILLECTOMY     UPPER GI ENDOSCOPY     VENTRAL HERNIA REPAIR      Current Medications: Current Meds  Medication Sig   acetaminophen (TYLENOL) 325 MG tablet Take 650 mg by mouth every 6 (six) hours as needed for moderate pain or headache.   Ascorbic Acid (VITAMIN C) 500 MG CAPS Take 500 mg by mouth daily.    BOTOX 100 units SOLR injection Inject 100 Units into the muscle See admin instructions. Inject 100 units intramuscularly every 6-8 weeks   cetirizine (ZYRTEC) 10 MG tablet Take 10 mg by mouth daily.   Cholecalciferol (VITAMIN D) 125 MCG (5000 UT) CAPS Take 5,000 Units by mouth daily.   clobetasol ointment (TEMOVATE) 0.05 % Apply  to rash on shin  Daily  and cover with Saran wrap   dexamethasone (DECADRON) 4 MG tablet Take 1 tab 3 x day - 3 days, then 2 x  day - 3 days, then 1 tab daily   gabapentin (NEURONTIN) 600 MG tablet Take 600 mg by mouth See admin instructions. Take 600 mg by mouth in morning and 1200 mg at bedtime   levothyroxine (SYNTHROID) 50 MCG tablet TAKE 1 TABLET BY MOUTH DAILY ON A EMPTY STOMACH WITH WATER. DO NOT TAKE ANY ANTACID, CALCIUM OR MAGNESIUM FOR 4 HOURS. AVOID BIOTIN   lisinopril (ZESTRIL) 2.5 MG tablet Take 1 tablet (2.5 mg total) by mouth daily.   pantoprazole (PROTONIX) 40 MG tablet Take  1 tablet  2 x /day  for Acid Indigestion & Heartburn   Polyethyl Glycol-Propyl Glycol (SYSTANE ULTRA) 0.4-0.3 % SOLN Place 1 drop into both eyes 3 (three) times daily as needed (dry eyes).   pravastatin (PRAVACHOL) 40 MG tablet TAKE 1 TABLET BY MOUTH AT BEDTIME FOR CHOLESTEROL   Propylene Glycol (SYSTANE BALANCE) 0.6 % SOLN Place 1 drop into both eyes at bedtime.   triamcinolone cream (KENALOG) 0.1 % 2 (two)  times daily.   zinc gluconate 50 MG tablet Take 50 mg by mouth daily.     Allergies:   Patient has no known allergies.   Social History   Socioeconomic History   Marital status: Married    Spouse name: Not on file   Number of children: 2   Years of education: 18   Highest education level: Not on file  Occupational History   Not on file  Tobacco Use   Smoking status: Never   Smokeless tobacco: Never  Vaping Use   Vaping Use: Never used  Substance and Sexual Activity   Alcohol use: No   Drug use: No   Sexual activity: Yes  Other Topics Concern   Not on file  Social History Narrative   Not on file   Social Determinants of Health   Financial Resource Strain: Not on file  Food Insecurity: Not on file  Transportation Needs: Not on file  Physical Activity: Not on file  Stress: Not on file  Social Connections: Not on file     Family History: The patient's family history includes Breast cancer in her sister; Cancer in her mother; Congestive Heart Failure in her father; Gout in her maternal aunt; Heart disease in her mother; Hypertension in her mother; Ovarian cancer in her mother; Stroke in her paternal uncle; Uterine cancer in her mother. There is no history of Colon cancer, Esophageal cancer, Stomach cancer, or Rectal cancer.  ROS:   Please see the history of present illness.      EKGs/Labs/Other Studies Reviewed:    The following studies were reviewed today:   EKG:  EKG is  ordered today.  The ekg ordered today demonstrates  04/28/2022: SR LAE, LVH Rate 74   Cardiac Studies & Procedures       ECHOCARDIOGRAM  ECHOCARDIOGRAM COMPLETE 12/14/2020  Narrative ECHOCARDIOGRAM REPORT    Patient Name:   Jillian Rodgers Date of Exam: 12/14/2020 Medical Rec #:  [id number]              Height:       63.0 in Accession #:    [id number]             Weight:       162.0 lb Date of Birth:  1945-10-26              BSA:          1.768 m Patient Age:    89 years  BP:           132/82 mmHg Patient Gender: F                      HR:           59 bpm. Exam Location:  Outpatient  Procedure: 2D Echo, Cardiac Doppler and Color Doppler  Indications:    Atrial Fibrillation  History:        Patient has no prior history of Echocardiogram examinations.  Sonographer:    Merrie Roof RDCS Referring Phys: [id number] [place]   1. Left ventricular ejection fraction, by estimation, is 60 to 65%. The left ventricle has normal function. The left ventricle has no regional wall motion abnormalities. Left ventricular diastolic parameters are indeterminate. 2. Right ventricular systolic function is normal. The right ventricular size is normal. There is normal pulmonary artery systolic pressure. 3. The mitral valve is normal in structure. Mild mitral valve regurgitation. 4. The aortic valve is tricuspid. Aortic valve regurgitation is not visualized. Mild aortic valve sclerosis is present, with no evidence of aortic valve stenosis.  FINDINGS Left Ventricle: Left ventricular ejection fraction, by estimation, is 60 to 65%. The left ventricle has normal function. The left ventricle has no regional wall motion abnormalities. The left ventricular internal cavity size was normal in size. There is no left ventricular hypertrophy. Left ventricular diastolic parameters are indeterminate.  Right Ventricle: The right ventricular size is normal. Right vetricular wall thickness was not assessed. Right ventricular systolic function is normal. There is normal pulmonary artery systolic pressure. The tricuspid regurgitant velocity is 2.21 m/s, and with an assumed right atrial pressure of 3 mmHg, the estimated right ventricular systolic pressure is 76.8 mmHg.  Left Atrium: Left atrial size was normal in size.  Right Atrium: Right atrial size was normal in size.  Pericardium: There is no evidence of pericardial effusion.  Mitral Valve: The mitral valve is normal  in structure. Mild mitral valve regurgitation.  Tricuspid Valve: The tricuspid valve is normal in structure. Tricuspid valve regurgitation is trivial.  Aortic Valve: The aortic valve is tricuspid. Aortic valve regurgitation is not visualized. Mild aortic valve sclerosis is present, with no evidence of aortic valve stenosis. Aortic valve mean gradient measures 4.0 mmHg. Aortic valve peak gradient measures 7.3 mmHg. Aortic valve area, by VTI measures 1.75 cm.  Pulmonic Valve: The pulmonic valve was not well visualized. Pulmonic valve regurgitation is not visualized. No evidence of pulmonic stenosis.  Aorta: The aortic root and ascending aorta are structurally normal, with no evidence of dilitation.  IAS/Shunts: No atrial level shunt detected by color flow Doppler.   LEFT VENTRICLE PLAX 2D LVIDd:         4.40 cm  Diastology LVIDs:         3.10 cm  LV e' medial:   5.55 cm/s LV PW:         1.00 cm  LV E/e' medial: 12.0 LV IVS:        0.90 cm LVOT diam:     1.80 cm LV SV:         58 LV SV Index:   33 LVOT Area:     2.54 cm   RIGHT VENTRICLE RV Basal diam:  3.30 cm  LEFT ATRIUM             Index       RIGHT ATRIUM           Index  LA diam:        3.80 cm 2.15 cm/m  RA Area:     17.60 cm LA Vol (A2C):   63.1 ml 35.69 ml/m RA Volume:   44.30 ml  25.06 ml/m LA Vol (A4C):   58.0 ml 32.81 ml/m LA Biplane Vol: 61.6 ml 34.84 ml/m AORTIC VALVE AV Area (Vmax):    2.07 cm AV Area (Vmean):   1.90 cm AV Area (VTI):     1.75 cm AV Vmax:           135.00 cm/s AV Vmean:          91.700 cm/s AV VTI:            0.328 m AV Peak Grad:      7.3 mmHg AV Mean Grad:      4.0 mmHg LVOT Vmax:         110.00 cm/s LVOT Vmean:        68.500 cm/s LVOT VTI:          0.226 m LVOT/AV VTI ratio: 0.69  AORTA Ao Root diam: 2.60 cm Ao Asc diam:  2.40 cm  MITRAL VALVE               TRICUSPID VALVE MV Area (PHT): 3.53 cm    TR Peak grad:   19.5 mmHg MV Decel Time: 215 msec    TR Vmax:         221.00 cm/s MV E velocity: 66.70 cm/s MV A velocity: 85.80 cm/s  SHUNTS MV E/A ratio:  0.78        Systemic VTI:  0.23 m Systemic Diam: 1.80 cm  Dorris Carnes MD Electronically signed by Dorris Carnes MD Signature Date/Time: 12/14/2020/5:20:02 PM    Final    MONITORS  CARDIAC EVENT MONITOR 03/13/2021  Narrative  Underlying rhythm is NSR  No instances of atrial fibrillation.            Recent Labs: 02/01/2022: ALT 14; BUN 29; Creat 1.14; Magnesium 2.1; Potassium 3.6; Sodium 140; TSH 1.63 02/14/2022: Hemoglobin 12.8; Platelet Count 72  Recent Lipid Panel    Component Value Date/Time   CHOL 169 02/01/2022 1442   TRIG 148 02/01/2022 1442   HDL 76 02/01/2022 1442   CHOLHDL 2.2 02/01/2022 1442   VLDL 34 (H) 09/26/2016 1147   LDLCALC 70 02/01/2022 1442     Risk Assessment/Calculations:    CHA2DS2-VASc Score = 4   This indicates a 4.8% annual risk of stroke. The patient's score is based upon: CHF History: 0 HTN History: 1 Diabetes History: 0 Stroke History: 0 Vascular Disease History: 0 Age Score: 2 Gender Score: 1     HYPERTENSION CONTROL Vitals:   04/28/22 0857 04/28/22 0927  BP: (!) 140/92 (!) 148/86    The patient's blood pressure is elevated above target today.  In order to address the patient's elevated BP: A new medication was prescribed today.            Physical Exam:    VS:  BP (!) 148/86   Pulse 74   Ht '5\' 4"'$  (1.626 m)   Wt 154 lb (69.9 kg)   SpO2 98%   BMI 26.43 kg/m     Wt Readings from Last 3 Encounters:  04/28/22 154 lb (69.9 kg)  04/20/22 155 lb 3.2 oz (70.4 kg)  02/14/22 149 lb (67.6 kg)    Gen: No distress   Neck: No JVD Cardiac: No rubs or gallops, no murmur, RRR +2  radial pulses Respiratory: Clear to auscultation bilaterally, normal effort, normal  respiratory rate GI: Soft, nontender, non-distended  MS: No  edema;  moves all extremities Integument: Skin feels good on left calf Neuro:  At time of evaluation, alert and  oriented to person/place/time/situation  Psych: Normal affect, patient feels OK  ASSESSMENT:    1. Paroxysmal atrial fibrillation (HCC)   2. Primary hypertension   3. Labile hypertension   4. Stage 3b chronic kidney disease ([place])   5. Mixed hyperlipidemia    PLAN:    PAF - CHADSVASC 4, no AC due to low platelets - OSA no longer on therapy due to claustrophobia- we have discussed options for therapy - with hypothyroidism on stable TSH - ILR discussion deferred - LAA-O Discussion deferred  HTN CKD 3b LVH - continue ambulatory BP monitoring - I would like to try a low dose of blood pressure medication: lisinopril 2.5 mg and f/u in 1-2 weeks   HLD - on pravastatin 70  Six months f/u; results to Dr. Chryl Rodgers as well as PCP; if return of AF would need to consider [facility] and a bridge to LAA-O anti-platele  Time Spent Directly with Patient:   I have spent a total of 40 minutes with the patient reviewing notes, imaging, EKGs, labs and examining the patient as well as establishing an assessment and plan that was discussed personally with the patient.  > 50% of time was spent in direct patient care.       Medication Adjustments/Labs and Tests Ordered: Current medicines are reviewed at length with the patient today.  Concerns regarding medicines are outlined above.  Orders Placed This Encounter  Procedures   Basic metabolic panel   EKG [id number]   Meds ordered this encounter  Medications   lisinopril (ZESTRIL) 2.5 MG tablet    Sig: Take 1 tablet (2.5 mg total) by mouth daily.    Dispense:  90 tablet    Refill:  3    Patient Instructions  Medication Instructions:  Your physician has recommended you make the following change in your medication:  START: lisinopril 2.5 mg by mouth once daily  *If you need a refill on your cardiac medications before your next appointment, please call your pharmacy*   Lab Work: IN 1-2 WEEKS: BMP  If you have labs (blood work) drawn today and your  tests are completely normal, you will receive your results only by: [place] (if you have MyChart) OR A paper copy in the mail If you have any lab test that is abnormal or we need to change your treatment, we will call you to review the results.   Testing/Procedures: NONE   Follow-Up: At [facility], you and your health needs are our priority.  As part of our continuing mission to provide you with exceptional heart care, we have created designated Provider Care Teams.  These Care Teams include your primary Cardiologist (physician) and Advanced Practice Providers (APPs -  Physician Assistants and Nurse Practitioners) who all work together to provide you with the care you need, when you need it.  We recommend signing up for the patient portal called "MyChart".  Sign up information is provided on this After Visit Summary.  MyChart is used to connect with patients for Virtual Visits (Telemedicine).  Patients are able to view lab/test results, encounter notes, upcoming appointments, etc.  Non-urgent messages can be sent to your provider as well.   To learn more about what you can do with MyChart, go  to [email or web address].    Your next appointment:   1 year(s)  Provider:   Werner Lean, MD       Signed, Jillian Lean, MD  04/28/2022 9:52 AM    [place]

## 2022-04-28 ENCOUNTER — Encounter: Payer: Self-pay | Admitting: Internal Medicine

## 2022-04-28 ENCOUNTER — Ambulatory Visit: Payer: Medicare PPO | Attending: Internal Medicine | Admitting: Internal Medicine

## 2022-04-28 VITALS — BP 148/86 | HR 74 | Ht 64.0 in | Wt 154.0 lb

## 2022-04-28 DIAGNOSIS — I480 Paroxysmal atrial fibrillation: Secondary | ICD-10-CM | POA: Diagnosis not present

## 2022-04-28 DIAGNOSIS — N1832 Chronic kidney disease, stage 3b: Secondary | ICD-10-CM | POA: Diagnosis not present

## 2022-04-28 DIAGNOSIS — R0989 Other specified symptoms and signs involving the circulatory and respiratory systems: Secondary | ICD-10-CM | POA: Diagnosis not present

## 2022-04-28 DIAGNOSIS — E782 Mixed hyperlipidemia: Secondary | ICD-10-CM | POA: Diagnosis not present

## 2022-04-28 DIAGNOSIS — I10 Essential (primary) hypertension: Secondary | ICD-10-CM | POA: Diagnosis not present

## 2022-04-28 HISTORY — DX: Essential (primary) hypertension: I10

## 2022-04-28 MED ORDER — LISINOPRIL 2.5 MG PO TABS: 2.5000 mg | ORAL_TABLET | Freq: Every day | ORAL | 3 refills | Status: DC

## 2022-04-28 NOTE — Patient Instructions (Signed)
Medication Instructions:  Your physician has recommended you make the following change in your medication:  START: lisinopril 2.5 mg by mouth once daily  *If you need a refill on your cardiac medications before your next appointment, please call your pharmacy*   Lab Work: IN 1-2 WEEKS: BMP  If you have labs (blood work) drawn today and your tests are completely normal, you will receive your results only by: [place] (if you have MyChart) OR A paper copy in the mail If you have any lab test that is abnormal or we need to change your treatment, we will call you to review the results.   Testing/Procedures: NONE   Follow-Up: At [facility], you and your health needs are our priority.  As part of our continuing mission to provide you with exceptional heart care, we have created designated Provider Care Teams.  These Care Teams include your primary Cardiologist (physician) and Advanced Practice Providers (APPs -  Physician Assistants and Nurse Practitioners) who all work together to provide you with the care you need, when you need it.  We recommend signing up for the patient portal called "MyChart".  Sign up information is provided on this After Visit Summary.  MyChart is used to connect with patients for Virtual Visits (Telemedicine).  Patients are able to view lab/test results, encounter notes, upcoming appointments, etc.  Non-urgent messages can be sent to your provider as well.   To learn more about what you can do with MyChart, go to [email or web address].    Your next appointment:   1 year(s)  Provider:   Werner Lean, MD

## 2022-05-04 ENCOUNTER — Telehealth: Payer: Self-pay | Admitting: Internal Medicine

## 2022-05-04 NOTE — Telephone Encounter (Signed)
Called pt in regards to low BP.  Reports took lisinopril at 7:45 am BP today 84/49 feels dizzy. Advised pt to drink lots of fluids and eat a salty snack.  Advised not to make any sudden movements. When standing up stand in place for a few and then ambulate.  Reports was on BP medication previously and had low BP so med was stopped.  Pt can not remember the name of the medication.  Advised pt to stop taking lisinopril.  Also advised pt for future reference not to take BP med if SBP <100. Reports has a hair appointment today and doesn't know what to do is dizzy and doesn't know if should drive.  Advised to either have someone drive her or reschedule.  Advised should not drive. Expresses understanding.   BP log 1/14-114/74 1/15- 161/102 1/16- 80/61-89 1/17- 80/51 1/18-84/49  Will send to provider to review.

## 2022-05-04 NOTE — Telephone Encounter (Signed)
Pt c/o medication issue:  1. Name of Medication: Lisinopril  2. How are you currently taking this medication (dosage and times per day)? 1 time a day  3. Are you having a reaction (difficulty breathing--STAT)?   4. What is your medication issue? Very,very dizzy, stomach hurting a lot  and having more bowel movements, and very weak- She said her blood pressure have dropped - this morning it was 84/49 heart rate 82 this morning- she said this hurting under her chin and shoulder hurting     Pt c/o BP issue:  1. What are your last 5 BP readings?today it is 84/49 Yesterday 80/51 heart rate 59   2. Are you having any other symptoms (ex. Dizziness, headache, blurred vision, passed out)? very dizzy 3. What is your medication issue? Blood pressure is running low

## 2022-05-04 NOTE — Telephone Encounter (Signed)
Pt returning nurse's call. Please advise

## 2022-05-04 NOTE — Telephone Encounter (Signed)
Left a message to call back.

## 2022-05-04 NOTE — Telephone Encounter (Signed)
Called pt advised of MD recommendation:   WE can stop her lisinopril.  She has had this happen with a different medication (She could not remember which) and we a\gree to try the lowest dose of medication to see her response. If BP's rebound and are elevated, we will have her see HTN clinic: we would re-offer her to seek OSA treatment as this would be a non pharmacologic way to keep her BP down   Pt is agreeable to stop lisinopril.  Will think about possible treatment for OSA.  Reports feels a little better.  Canceled hair appointment.  Advised pt if BP does not recover despite increased PO fluids and salty snack to seek urgent care.

## 2022-05-04 NOTE — Addendum Note (Signed)
Addended by: Precious Gilding on: 05/04/2022 11:08 AM   Modules accepted: Orders

## 2022-05-10 ENCOUNTER — Ambulatory Visit: Payer: Medicare PPO | Admitting: Nurse Practitioner

## 2022-05-10 ENCOUNTER — Encounter: Payer: Self-pay | Admitting: Nurse Practitioner

## 2022-05-10 VITALS — BP 146/96 | HR 64 | Temp 98.8°F | Ht 64.0 in | Wt 153.0 lb

## 2022-05-10 DIAGNOSIS — N1831 Chronic kidney disease, stage 3a: Secondary | ICD-10-CM | POA: Diagnosis not present

## 2022-05-10 DIAGNOSIS — K2100 Gastro-esophageal reflux disease with esophagitis, without bleeding: Secondary | ICD-10-CM | POA: Diagnosis not present

## 2022-05-10 DIAGNOSIS — E039 Hypothyroidism, unspecified: Secondary | ICD-10-CM | POA: Diagnosis not present

## 2022-05-10 DIAGNOSIS — Z0000 Encounter for general adult medical examination without abnormal findings: Secondary | ICD-10-CM

## 2022-05-10 DIAGNOSIS — M1990 Unspecified osteoarthritis, unspecified site: Secondary | ICD-10-CM

## 2022-05-10 DIAGNOSIS — R0989 Other specified symptoms and signs involving the circulatory and respiratory systems: Secondary | ICD-10-CM | POA: Diagnosis not present

## 2022-05-10 DIAGNOSIS — E663 Overweight: Secondary | ICD-10-CM

## 2022-05-10 DIAGNOSIS — M816 Localized osteoporosis [Lequesne]: Secondary | ICD-10-CM

## 2022-05-10 DIAGNOSIS — D696 Thrombocytopenia, unspecified: Secondary | ICD-10-CM | POA: Diagnosis not present

## 2022-05-10 DIAGNOSIS — E782 Mixed hyperlipidemia: Secondary | ICD-10-CM | POA: Diagnosis not present

## 2022-05-10 DIAGNOSIS — D500 Iron deficiency anemia secondary to blood loss (chronic): Secondary | ICD-10-CM | POA: Diagnosis not present

## 2022-05-10 DIAGNOSIS — R6889 Other general symptoms and signs: Secondary | ICD-10-CM

## 2022-05-10 DIAGNOSIS — E559 Vitamin D deficiency, unspecified: Secondary | ICD-10-CM

## 2022-05-10 DIAGNOSIS — Z79899 Other long term (current) drug therapy: Secondary | ICD-10-CM | POA: Diagnosis not present

## 2022-05-10 DIAGNOSIS — Z0001 Encounter for general adult medical examination with abnormal findings: Secondary | ICD-10-CM

## 2022-05-10 DIAGNOSIS — I480 Paroxysmal atrial fibrillation: Secondary | ICD-10-CM

## 2022-05-10 NOTE — Progress Notes (Signed)
MEDICARE ANNUAL WELLNESS VISIT AND FOLLOW UP  Assessment:   Diagnoses and all orders for this visit:  Encounter for Medicare annual wellness exam Due annually Health maintenance reviewed  Paroxysmal atrial fibrillation ([place]) No change and currently controlled. Continue to follow with Cardiology. Continue to monitor   Labile hypertension Cardiology following Continue medications Discussed DASH (Dietary Approaches to Stop Hypertension) DASH diet is lower in sodium than a typical American diet. Cut back on foods that are high in saturated fat, cholesterol, and trans fats. Eat more whole-grain foods, fish, poultry, and nuts Remain active and exercise as tolerated daily.  Monitor BP at home-Call if greater than 130/80.  Check CMP/CBC   Iron deficiency anemia due to chronic blood loss Monitor CBC  Continue to follow up with Hematology  Thrombocytopenia (HCC) Monitor  CBC with Differential/Platelet  Stage 3b chronic kidney disease ([place]) Discussed how what you eat and drink can aide in kidney protection. Stay well hydrated. Avoid high salt foods. Avoid NSAIDS. Keep BP and BG well controlled.   Take medications as prescribed. Remain active and exercise as tolerated daily. Maintain weight.  Continue to monitor. Check CMP/GFR/Microablumin  Gastroesophageal reflux disease with esophagitis without hemorrhage No suspected reflux complications ([place]/stricture). Lifestyle modification:  wt loss, avoid meals 2-3h before bedtime. Consider eliminating food triggers:  chocolate, caffeine, EtOH, acid/spicy food.  Hypothyroidism, unspecified type Controlled. Continue Levothyroxine. Reminded to take on an empty stomach 30-86mns before food.  Stop any Biotin Supplement 48-72 hours before next TSH level to reduce the risk of falsely low TSH levels. Continue to monitor.    Osteoarthritis, unspecified osteoarthritis type, unspecified site Controlled. RICE when flared. Continue  to monitor  Mixed hyperlipidemia Controlled Discussed lifestyle modifications. Recommended diet heavy in fruits and veggies, omega 3's. Decrease consumption of animal meats, cheeses, and dairy products. Remain active and exercise as tolerated. Continue to monitor.  Vitamin D deficiency At goal. Continue supplement Continue to monitor  Overweight (BMI 25.0-29.9) Discussed appropriate BMI Diet modification. Physical activity. Encouraged/praised to build confidence.  Medication management All medications discussed and reviewed in full. All questions and concerns regarding medications addressed.    Osteoporosis DEXA ordered Pursue a combination of weight-bearing exercises and strength training. Patients with severe mobility impairment should be referred for physical therapy. Advised on fall prevention measures including proper lighting in all rooms, removal of area rugs and floor clutter, use of walking devices as deemed appropriate, avoidance of uneven walking surfaces. Smoking cessation and moderate alcohol consumption if applicable Consume 8595to 1000 IU of vitamin D daily with a goal vitamin D serum value of 30 ng/mL or higher. Aim for 1000 to 1200 mg of elemental calcium daily through supplements and/or dietary sources.  Orders Placed This Encounter  Procedures   DG Bone Density    Standing Status:   Future    Standing Expiration Date:   05/11/2023    Order Specific Question:   Reason for Exam (SYMPTOM  OR DIAGNOSIS REQUIRED)    Answer:   annual screening    Order Specific Question:   Preferred imaging location?    Answer:   [facility]   CBC with Differential/Platelet   COMPLETE METABOLIC PANEL WITH GFR   Lipid panel   VITAMIN D 25 Hydroxy (Vit-D Deficiency, Fractures)   TSH     Notify office for further evaluation and treatment, questions or concerns if any reported s/s fail to improve.   The patient was advised to call back or seek an in-person evaluation  if any symptoms worsen or if the condition fails to improve as anticipated.   Further disposition pending results of labs. Discussed med's effects and SE's.    I discussed the assessment and treatment plan with the patient. The patient was provided an opportunity to ask questions and all were answered. The patient agreed with the plan and demonstrated an understanding of the instructions.  Discussed med's effects and SE's. Screening labs and tests as requested with regular follow-up as recommended.  I provided 40 minutes of face-to-face time during this encounter including counseling, chart review, and critical decision making was preformed.  Future Appointments  Date Time Provider [place]  05/11/2022 11:30 AM [facility]  06/29/2022 11:45 AM Benay Pike, MD [facility] None  09/13/2022 11:00 AM Unk Pinto, MD [facility] None  05/10/2023  4:00 PM Darrol Jump, NP [facility] None     Plan:   During the course of the visit the patient was educated and counseled about appropriate screening and preventive services including:   Pneumococcal vaccine  Prevnar 13 Influenza vaccine Td vaccine Screening electrocardiogram Bone densitometry screening Colorectal cancer screening Diabetes screening Glaucoma screening Nutrition counseling  Advanced directives: requested   Subjective:  CALLE SCHADER is a 77 y.o. female who presents for Medicare Annual Wellness Visit and 3 month follow up.   Overall she report feeling well.  She has no new concerns to report in clinic today.  Had right inguinal hernia repair with Dr. Annamaria [facility], 10/24/21. Doing well  She had DEXA 06/2020 showing osteoporosis limited to radius, L fem T score was -1.6, stable from 2017. She was prescribed fosamax but prefers not to start unless absolutely necessary due to SE risk.   Patient has GERD, failed trial of transition of Protonix to Ranitidine alone and  currently is controlled on Protonix/am. She underwent EGD 03/2018 which showed gastritis without evidence of dysplasia, malignancy or h. Pylori.  She has R hammer toe surgery Sept 9th 2021 with Dr. Geroge Baseman, still some trouble but improved.   She is followed by neurology for headaches/migraines and getting botox shots, much improved.   She is followed by [facility]/Hematology for IDA. Next apt 12/2021    She also follows with Cardiollgy for paroxysmal atrial fib, not on anticoagulation d/t h/o thrombocytopenia, recently seen on 08/31/21 via telehealth.  She continues to be stable.  BMI is Body mass index is 26.26 kg/m., she has not been working on diet and exercise, Wt Readings from Last 3 Encounters:  05/10/22 153 lb (69.4 kg)  04/28/22 154 lb (69.9 kg)  04/20/22 155 lb 3.2 oz (70.4 kg)   Was having frequent near syncopal episodes, reports resolved spontaneously and none this last year   Her blood pressure has been controlled at home, but low in clinic today.  She is asymptomatic.  Today their BP is BP: (!) 146/96 She does not workout. She denies chest pain, shortness of breath, dizziness.  She is not staying well hydrated.   She is on cholesterol medication (pravastatin 40 mg daily) and denies myalgias. Her cholesterol is not at goal. The cholesterol last visit was:   Lab Results  Component Value Date   CHOL 169 02/01/2022   HDL 76 02/01/2022   LDLCALC 70 02/01/2022   TRIG 148 02/01/2022   CHOLHDL 2.2 02/01/2022    She has not been working on diet and exercise for glucose management, and denies foot ulcerations, increased appetite, nausea, paresthesia of the feet, polydipsia, polyuria, visual disturbances, vomiting  and weight loss. Last A1C in the office was:  Lab Results  Component Value Date   HGBA1C 5.6 02/01/2022   She is on thyroid medication. Her medication was not changed last visit.  50 mcg daily Lab Results  Component Value Date   TSH 1.63 02/01/2022   She is followed  by Dr. Corliss Parish. Follows for renal cysts, stable on recent MRI. Also CKD III. Last GFR: Lab Results  Component Value Date   GFRNONAA 52 (L) 06/30/2021   GFRNONAA 48 (L) 03/08/2021   GFRNONAA 54 (L) 10/25/2020   Patient is on Vitamin D supplement and at goal at recent check:    Lab Results  Component Value Date   VD25OH 86 02/01/2022      She has persistent stable thrombocytopenia for many years, recently working up by hematology Dr. Chryl Heck, had bone marrow biopsy but unremarkable.     Latest Ref Rng & Units 02/14/2022   11:46 AM 02/01/2022    2:42 PM 01/02/2022   10:53 AM  CBC  WBC 4.0 - 10.5 K/uL 6.1  6.8  6.2   Hemoglobin 12.0 - 15.0 g/dL 12.8  12.3  12.7   Hematocrit 36.0 - 46.0 % 37.9  36.6  38.3   Platelets 150 - 400 K/uL 72  69  48    She had iron def anemia following surgery which has resolved.  Lab Results  Component Value Date   IRON 93 07/07/2020   TIBC 253 07/07/2020   FERRITIN 240 07/07/2020      Medication Review: Current Outpatient Medications on File Prior to Visit  Medication Sig Dispense Refill   acetaminophen (TYLENOL) 325 MG tablet Take 650 mg by mouth every 6 (six) hours as needed for moderate pain or headache.     Ascorbic Acid (VITAMIN C) 500 MG CAPS Take 500 mg by mouth daily.      BOTOX 100 units SOLR injection Inject 100 Units into the muscle See admin instructions. Inject 100 units intramuscularly every 6-8 weeks     cetirizine (ZYRTEC) 10 MG tablet Take 10 mg by mouth daily.     Cholecalciferol (VITAMIN D) 125 MCG (5000 UT) CAPS Take 5,000 Units by mouth daily.     clobetasol ointment (TEMOVATE) 0.05 % Apply  to rash on shin  Daily  and cover with Saran wrap 60 g 3   gabapentin (NEURONTIN) 600 MG tablet Take 600 mg by mouth See admin instructions. Take 600 mg by mouth in morning and 1200 mg at bedtime     levothyroxine (SYNTHROID) 50 MCG tablet TAKE 1 TABLET BY MOUTH DAILY ON A EMPTY STOMACH WITH WATER. DO NOT TAKE ANY ANTACID, CALCIUM  OR MAGNESIUM FOR 4 HOURS. AVOID BIOTIN 90 tablet 3   pantoprazole (PROTONIX) 40 MG tablet Take  1 tablet  2 x /day  for Acid Indigestion & Heartburn 180 tablet 3   Polyethyl Glycol-Propyl Glycol (SYSTANE ULTRA) 0.4-0.3 % SOLN Place 1 drop into both eyes 3 (three) times daily as needed (dry eyes).     pravastatin (PRAVACHOL) 40 MG tablet TAKE 1 TABLET BY MOUTH AT BEDTIME FOR CHOLESTEROL 90 tablet 3   Propylene Glycol (SYSTANE BALANCE) 0.6 % SOLN Place 1 drop into both eyes at bedtime.     zinc gluconate 50 MG tablet Take 50 mg by mouth daily.     dexamethasone (DECADRON) 4 MG tablet Take 1 tab 3 x day - 3 days, then 2 x day - 3 days, then 1 tab daily  20 tablet 0   triamcinolone cream (KENALOG) 0.1 % 2 (two) times daily.     No current facility-administered medications on file prior to visit.    No Known Allergies  Current Problems (verified) Patient Active Problem List   Diagnosis Date Noted   Primary hypertension 04/28/2022   Snoring 03/04/2021   Paroxysmal atrial fibrillation ([place]) 11/01/2020   Secondary hypercoagulable state ([place]) 11/01/2020   Increased urinary frequency 10/20/2020   Localized osteoporosis without current pathological fracture 10/20/2020   IDA (iron deficiency anemia) 04/07/2020   Thrombocytopenia ([place]) 11/25/2015   Encounter for Medicare annual wellness exam 06/14/2015   Overweight (BMI 25.0-29.9) 01/28/2015   CKD (chronic kidney disease) stage 3, GFR 30-59 ml/min (HCC) 01/19/2014   Upper airway cough syndrome 12/16/2013   Vitamin D deficiency 05/21/2013   Medication management 05/21/2013   Labile hypertension 01/31/2013   Degenerative joint disease    GERD (gastroesophageal reflux disease)    Hyperlipidemia    Hypothyroidism     Screening Tests Immunization History  Administered Date(s) Administered   Influenza Inj Mdck Quad With Preservative 01/15/2018   Influenza, High Dose Seasonal PF 12/29/2013, 01/15/2015, 02/01/2016, 02/28/2017, 02/07/2018,  12/30/2018, 02/12/2020, 02/01/2021, 01/30/2022   Influenza-Unspecified 01/24/2011, 02/02/2012, 01/16/2015   PFIZER(Purple Top)SARS-COV-2 Vaccination 05/12/2019, 06/02/2019, 01/22/2020, 07/23/2020   Pneumococcal Conjugate-13 10/28/2014   Pneumococcal-Unspecified 11/28/2011   Td 11/17/2010   Tdap 11/12/2014   Zoster, Live 11/17/2010    Preventative care: Last colonoscopy: 2009 Last cologuard: DUE 02/2021 - negative Due 2025 Last EGD: 03/2018  Last mammogram: 04/2021 - does at GYN - Dr. Rogue Bussing, has abnormal R cluster of cysts stable - resume annual screening mammograms - Due DEXA:03/2016 L fem T -1.6, had in 06/2020, osteoporosis limited to L forearm, T -3.2, hip stable/unchanged from 2017 at T - 1.6, patient preference to defer fosamax, will recheck in 2 years. Due 2024 - ordered  Prior vaccinations: TD or Tdap: 2016  Influenza: 01/2022 Pneumococcal: 2013 Prevnar13: 2016 Shingles/Zostavax: 2012 - check with insurance about shingrix  Covid 19: 2/2, 2021, pfizer + 2 boosters  Names of Other Physician/Practitioners you currently use: 1. [facility]- here for primary care 2. Dr. Gershon Crane, eye doctor, last visit 2021, has upcoming scheduled Nov 3. Dr. Mathis Fare dentist, last visit 2022, goes q66m Patient Care Team: MUnk Pinto MD as PCP - General (Internal Medicine) CWerner Lean MD as PCP - Cardiology (Cardiology) GCorliss Parish [name]  SURGICAL HISTORY She  has a past surgical history that includes Ventral hernia repair; Cholecystectomy (1996); Pilonidal cyst excision (1972); Tonsillectomy; Upper gi endoscopy; Colonoscopy (2009); Hernia repair (1985); Hernia repair (2005 & 2009); SKIN CANCER REMOVAL ; BILATERAL CATARACT SURGERY ; BLERPHOROPLASTY \; Inguinal hernia repair (Left, 04/30/2020); and Inguinal hernia repair (Right, 10/24/2021). FAMILY HISTORY Her family history includes Breast  cancer in her sister; Cancer in her mother; Congestive Heart Failure in her father; Gout in her maternal aunt; Heart disease in her mother; Hypertension in her mother; Ovarian cancer in her mother; Stroke in her paternal uncle; Uterine cancer in her mother. SOCIAL HISTORY She  reports that she has never smoked. She has never used smokeless tobacco. She reports that she does not drink alcohol and does not use drugs.   MEDICARE WELLNESS OBJECTIVES: Physical activity:   Cardiac risk factors:   Depression/mood screen:      05/10/2022    3:37 PM  Depression screen PHQ 2/9  Decreased Interest 0  Down, Depressed, Hopeless  0  PHQ - 2 Score 0    ADLs:     05/10/2022    3:23 PM 02/01/2022    9:54 PM  In your present state of health, do you have any difficulty performing the following activities:  Hearing? 0 0  Vision? 0 0  Difficulty concentrating or making decisions? 0 0  Walking or climbing stairs? 0 0  Dressing or bathing? 0 0  Doing errands, shopping? 0 0  Preparing Food and eating ? N   Using the Toilet? N   In the past six months, have you accidently leaked urine? N   Do you have problems with loss of bowel control? N   Managing your Medications? N   Managing your Finances? N   Housekeeping or managing your Housekeeping? N      Cognitive Testing  Alert? Yes  Normal Appearance?Yes  Oriented to person? Yes  Place? Yes   Time? Yes  Recall of three objects?  Yes  Can perform simple calculations? Yes  Displays appropriate judgment?Yes  Can read the correct time from a watch face?Yes  EOL planning: Does Patient Have a Medical Advance Directive?: No     Review of Systems  Constitutional:  Negative for malaise/fatigue and weight loss.  HENT:  Negative for hearing loss and tinnitus.   Eyes:  Negative for blurred vision and double vision.  Respiratory:  Negative for cough, sputum production, shortness of breath and wheezing.   Cardiovascular:  Negative for chest pain,  palpitations, orthopnea, claudication, leg swelling and PND.  Gastrointestinal:  Negative for abdominal pain, blood in stool, constipation, diarrhea, heartburn, melena, nausea and vomiting.  Genitourinary:  Positive for frequency and urgency. Negative for dysuria, flank pain and hematuria.  Musculoskeletal:  Negative for falls, joint pain and myalgias.  Skin:  Negative for rash.  Neurological:  Positive for headaches (chronic, not new, getting botox injections). Negative for dizziness, tingling, sensory change, loss of consciousness and weakness.  Endo/Heme/Allergies:  Negative for polydipsia.  Psychiatric/Behavioral: Negative.  Negative for depression, memory loss, substance abuse and suicidal ideas. The patient is not nervous/anxious and does not have insomnia.   All other systems reviewed and are negative.    Objective:     Today's Vitals   05/10/22 1440  BP: (!) 146/96  Pulse: 64  Temp: 98.8 F (37.1 C)  SpO2: 99%  Weight: 153 lb (69.4 kg)  Height: '5\' 4"'$  (1.626 m)   Body mass index is 26.26 kg/m.  General appearance: alert, no distress, WD/WN,  female HEENT: normocephalic, sclerae anicteric, TMs pearly, nares patent, no discharge or erythema, pharynx normal Oral cavity: MMM, no lesions Neck: supple, no lymphadenopathy, no thyromegaly, no masses Heart: RRR, normal S1, S2, no murmurs Lungs: CTA bilaterally, no wheezes, rhonchi, or rales Abdomen: +bs, soft, LLQ/groin tenderness with easily reduced inguinal hernia, non distended, no masses, no hepatomegaly, no splenomegaly Musculoskeletal: nontender, no swelling, no obvious deformity Extremities: no edema, no cyanosis, no clubbing Pulses: 2+ symmetric, upper and lower extremities, normal cap refill Neurological: alert, oriented x 3, CN2-12 intact, strength normal upper extremities and lower extremities, sensation normal throughout, DTRs 2+ throughout, no cerebellar signs, gait normal Psychiatric: normal affect, behavior normal,  pleasant    Medicare Attestation I have personally reviewed: The patient's medical and social history Their use of alcohol, tobacco or illicit drugs Their current medications and supplements The patient's functional ability including ADLs,fall risks, home safety risks, cognitive, and hearing and visual impairment Diet and physical activities Evidence for depression  or mood disorders  The patient's weight, height, BMI, and visual acuity have been recorded in the chart.  I have made referrals, counseling, and provided education to the patient based on review of the above and I have provided the patient with a written personalized care plan for preventive services.     Darrol Jump, NP   05/10/2022

## 2022-05-10 NOTE — Patient Instructions (Signed)

## 2022-05-11 ENCOUNTER — Ambulatory Visit: Payer: Medicare PPO | Attending: Internal Medicine

## 2022-05-11 ENCOUNTER — Telehealth: Payer: Self-pay

## 2022-05-11 DIAGNOSIS — G4733 Obstructive sleep apnea (adult) (pediatric): Secondary | ICD-10-CM

## 2022-05-11 DIAGNOSIS — I10 Essential (primary) hypertension: Secondary | ICD-10-CM

## 2022-05-11 LAB — CBC WITH DIFFERENTIAL/PLATELET
Absolute Monocytes: 691 cells/uL (ref 200–950)
Basophils Absolute: 38 cells/uL (ref 0–200)
Basophils Relative: 0.6 %
Eosinophils Absolute: 314 cells/uL (ref 15–500)
Eosinophils Relative: 4.9 %
HCT: 35.5 % (ref 35.0–45.0)
Hemoglobin: 11.8 g/dL (ref 11.7–15.5)
Lymphs Abs: 1427 cells/uL (ref 850–3900)
MCH: 30.8 pg (ref 27.0–33.0)
MCHC: 33.2 g/dL (ref 32.0–36.0)
MCV: 92.7 fL (ref 80.0–100.0)
MPV: 12.8 fL — ABNORMAL HIGH (ref 7.5–12.5)
Monocytes Relative: 10.8 %
Neutro Abs: 3930 cells/uL (ref 1500–7800)
Neutrophils Relative %: 61.4 %
Platelets: 41 10*3/uL — ABNORMAL LOW (ref 140–400)
RBC: 3.83 10*6/uL (ref 3.80–5.10)
RDW: 12 % (ref 11.0–15.0)
Total Lymphocyte: 22.3 %
WBC: 6.4 10*3/uL (ref 3.8–10.8)

## 2022-05-11 LAB — COMPLETE METABOLIC PANEL WITH GFR
AG Ratio: 1.8 (calc) (ref 1.0–2.5)
ALT: 10 U/L (ref 6–29)
AST: 11 U/L (ref 10–35)
Albumin: 3.7 g/dL (ref 3.6–5.1)
Alkaline phosphatase (APISO): 67 U/L (ref 37–153)
BUN: 24 mg/dL (ref 7–25)
CO2: 25 mmol/L (ref 20–32)
Calcium: 9.5 mg/dL (ref 8.6–10.4)
Chloride: 106 mmol/L (ref 98–110)
Creat: 0.91 mg/dL (ref 0.60–1.00)
Globulin: 2.1 g/dL (calc) (ref 1.9–3.7)
Glucose, Bld: 73 mg/dL (ref 65–99)
Potassium: 4 mmol/L (ref 3.5–5.3)
Sodium: 140 mmol/L (ref 135–146)
Total Bilirubin: 0.5 mg/dL (ref 0.2–1.2)
Total Protein: 5.8 g/dL — ABNORMAL LOW (ref 6.1–8.1)
eGFR: 65 mL/min/{1.73_m2} (ref >=60)

## 2022-05-11 LAB — TSH: TSH: 2.32 mIU/L (ref 0.40–4.50)

## 2022-05-11 LAB — VITAMIN D 25 HYDROXY (VIT D DEFICIENCY, FRACTURES): Vit D, 25-Hydroxy: 77 ng/mL (ref 30–100)

## 2022-05-11 LAB — LIPID PANEL
Cholesterol: 153 mg/dL (ref <200)
HDL: 65 mg/dL (ref >=50)
LDL Cholesterol (Calc): 70 mg/dL (calc)
Non-HDL Cholesterol (Calc): 88 mg/dL (calc) (ref <130)
Total CHOL/HDL Ratio: 2.4 (calc) (ref <5.0)
Triglycerides: 94 mg/dL (ref <150)

## 2022-05-11 NOTE — Telephone Encounter (Signed)
Pt came in for blood work reports had blood work at PCP office.   Pt does not need BMP ordered by Dr. Gasper Sells.  Routed results from PCP to MD to review.  Pt reports still having diarrhea r/t lisinopril.  Pt stopped medication a week ago.  Advised pt medication should be out of system.  Contact PCP for diarrhea concerns.  Pt wants to let MD know that previously was against treating sleep apnea d/t claustrophobia.  Is now willing to revisit this but would need a device that doesn't cover face. Reports had a sleep study about a year ago.  Will send to Dr. Radford Pax for input.

## 2022-05-11 NOTE — Telephone Encounter (Signed)
I s/w the pt and she is agreeable to [place] sleep study per Dr. Radford Pax orders. Pt wants to wait until she comes back from [place] to do [place] study. Pt said she has a lot going on right now and does not want to feel rushed about doing another sleep study. I informed her that it will be fine and I will update the provider that we will set up once she is back from [place]. Pt will come by the office for set up 06/27/22 @ 11:30.

## 2022-05-11 NOTE — Telephone Encounter (Signed)
Left a message to call back.  Need to discuss with pt Dr. Theodosia Blender recommendation that pt have another sleep study.  Also need to update stopbang score.

## 2022-05-11 NOTE — Telephone Encounter (Signed)
Pt called in advised that Dr. Radford Pax recommendation was for another sleep study.  Advised will place order for home sleep study our sleep study set up team member will call to coordinate. Advised it may not be this week.   STOPBANG score: 5  Advised pt will have to download an app to phone for sleep study.  Pt is okay with this.  Order for [place] sleep study placed.

## 2022-05-15 NOTE — Telephone Encounter (Signed)
Prior Authorization for [facility] sent to [facility] via Phone. Reference # .  READY- NO PA REQ

## 2022-05-15 NOTE — Telephone Encounter (Signed)
Left message for the pt that she is approved. See previous notes where pt will set up study 06/27/22.

## 2022-05-16 DIAGNOSIS — Z1231 Encounter for screening mammogram for malignant neoplasm of breast: Secondary | ICD-10-CM | POA: Diagnosis not present

## 2022-05-16 DIAGNOSIS — Z01419 Encounter for gynecological examination (general) (routine) without abnormal findings: Secondary | ICD-10-CM | POA: Diagnosis not present

## 2022-05-19 ENCOUNTER — Other Ambulatory Visit: Payer: Self-pay

## 2022-05-19 MED ORDER — PRAVASTATIN SODIUM 40 MG PO TABS: ORAL_TABLET | ORAL | 3 refills | Status: DC

## 2022-05-23 DIAGNOSIS — L810 Postinflammatory hyperpigmentation: Secondary | ICD-10-CM | POA: Diagnosis not present

## 2022-05-23 DIAGNOSIS — Z85828 Personal history of other malignant neoplasm of skin: Secondary | ICD-10-CM | POA: Diagnosis not present

## 2022-05-23 DIAGNOSIS — L300 Nummular dermatitis: Secondary | ICD-10-CM | POA: Diagnosis not present

## 2022-05-23 DIAGNOSIS — L309 Dermatitis, unspecified: Secondary | ICD-10-CM | POA: Diagnosis not present

## 2022-06-02 IMAGING — DX DG CHEST 2V
2 series · 2 of 2 positions shown · non-contrast
Comparison: 07/22/2015 chest radiograph.

CLINICAL DATA: Chest pain

EXAM:
CHEST - 2 VIEW

[chest pa]
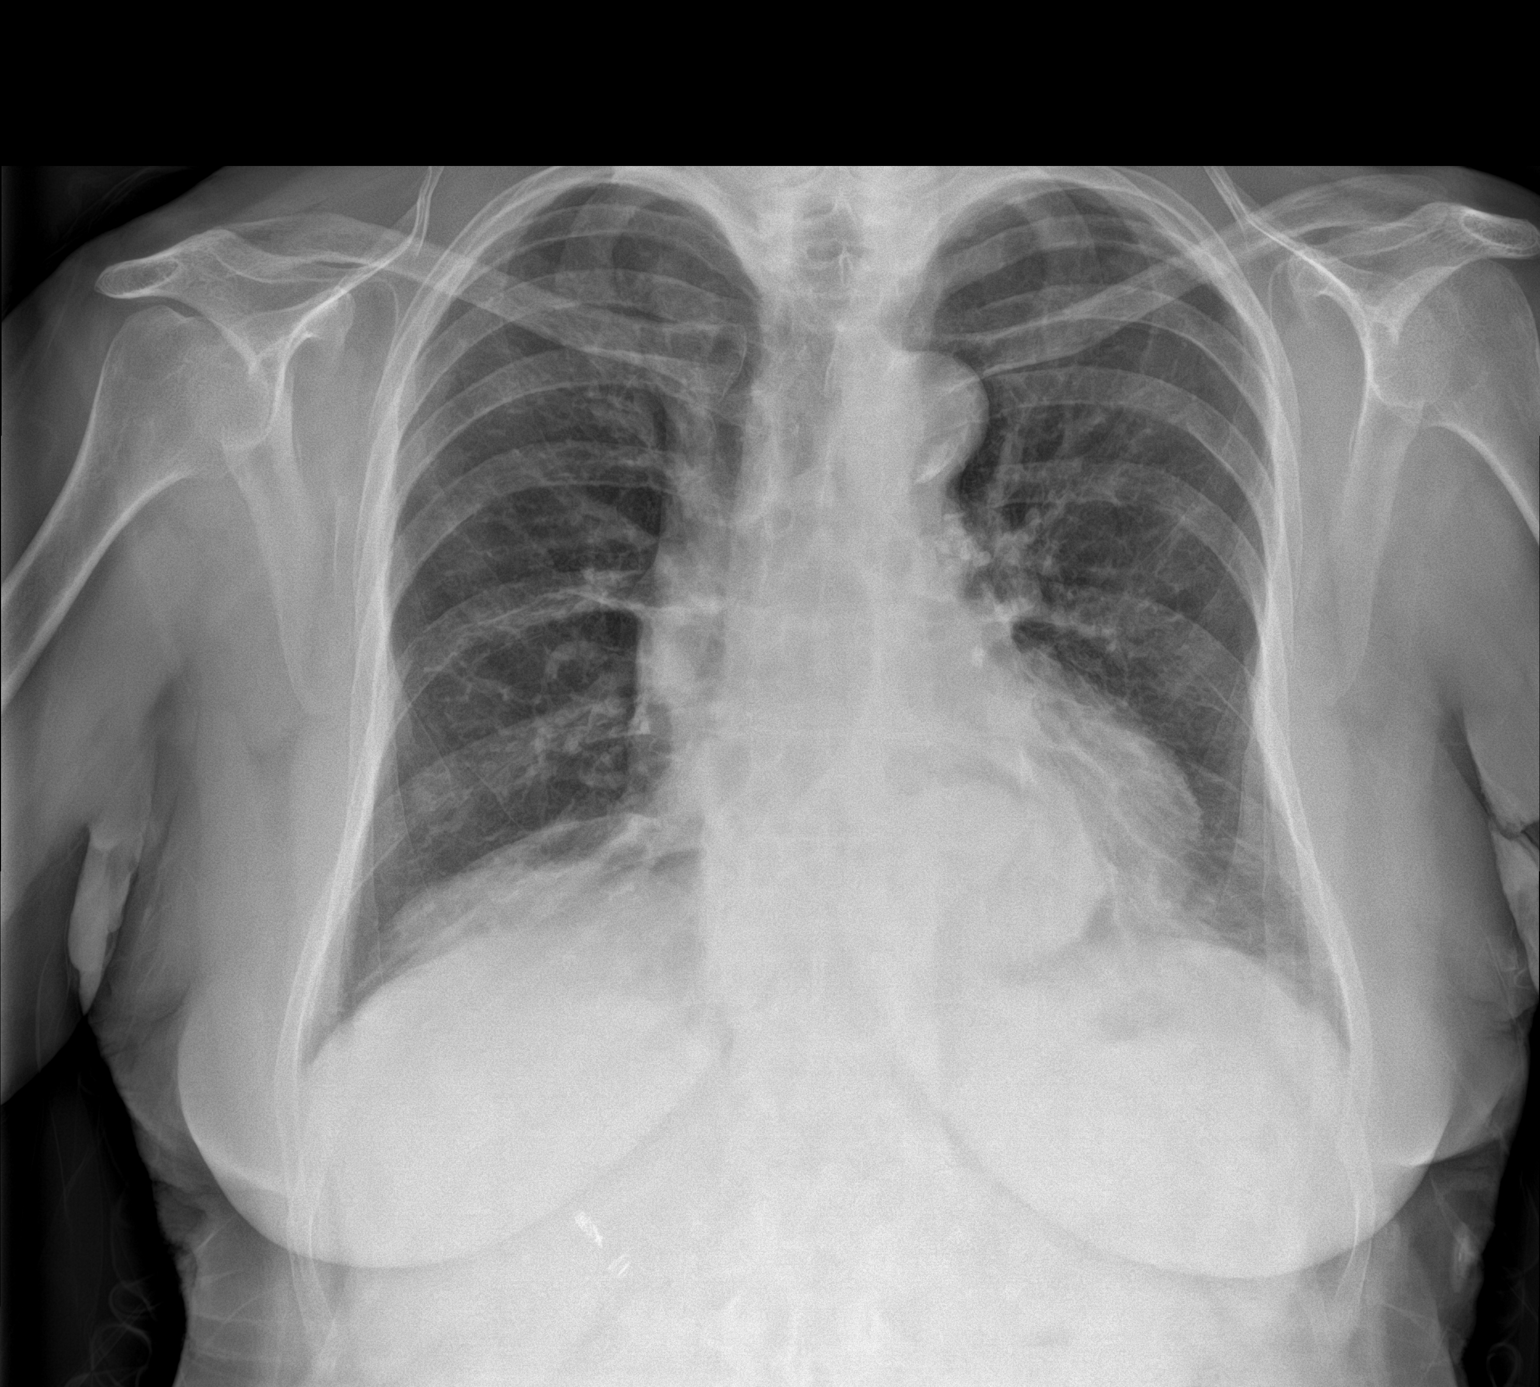

[chest lat]
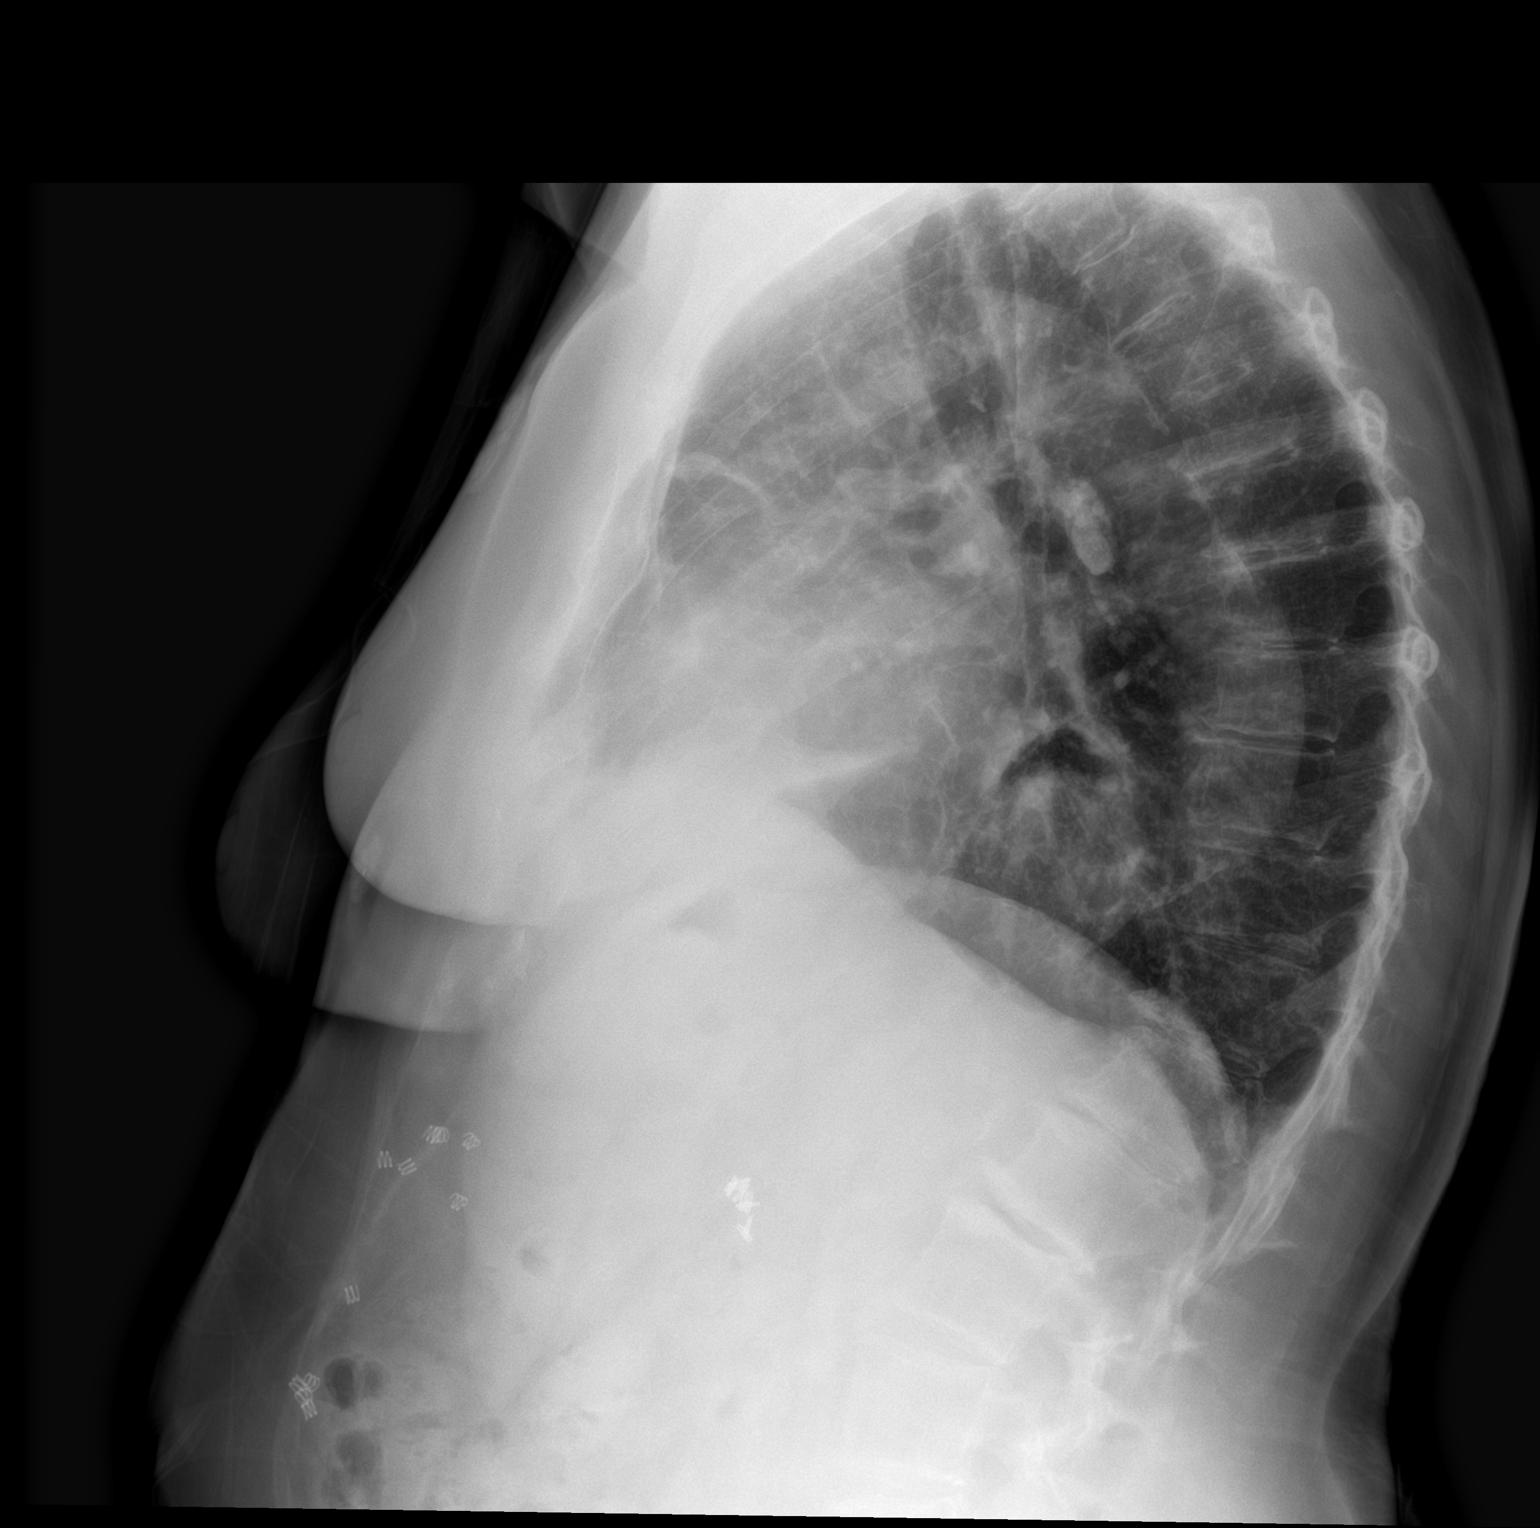

[2 of 2 positions shown; findings below may reference images not displayed]

FINDINGS: Normal heart size. Moderate to large hiatal hernia, increased.
Otherwise normal mediastinal contour. No pneumothorax. No pleural
effusion. No pulmonary edema. Mild bibasilar curvilinear scarring
versus atelectasis. No acute consolidative airspace disease. Hernia
repair mesh in the ventral upper abdomen. Cholecystectomy clips are
seen in the right upper quadrant of the abdomen.
IMPRESSION: 1. Moderate to large hiatal hernia, increased.
2. Mild bibasilar curvilinear scarring versus atelectasis.

## 2022-06-27 ENCOUNTER — Telehealth: Payer: Self-pay | Admitting: Internal Medicine

## 2022-06-27 NOTE — Telephone Encounter (Signed)
Pt called back and tells me that she has been sick and that she called in and left message to let me know that she could not make it today for sleep study set up. I apologized to the pt that a message never came to me. After speaking with the pt we left it at that I will reach out to her next week and see if she is feeling better before we set up for her to come by the office for [place] set up.

## 2022-06-27 NOTE — Telephone Encounter (Signed)
Patient is returning call about Jillian Rodgers Sleep Study Test.

## 2022-06-27 NOTE — Telephone Encounter (Signed)
See previous notes s/w the pt already

## 2022-06-27 NOTE — Telephone Encounter (Signed)
Left a message for the pt to call back to reschedule when she can come by the office to set up [place] study. Pt was supposed to come by the office today 06/27/22 @ 11:30.

## 2022-06-29 ENCOUNTER — Inpatient Hospital Stay: Payer: Medicare PPO | Admitting: Hematology and Oncology

## 2022-07-05 DIAGNOSIS — G43019 Migraine without aura, intractable, without status migrainosus: Secondary | ICD-10-CM | POA: Diagnosis not present

## 2022-07-05 DIAGNOSIS — M791 Myalgia, unspecified site: Secondary | ICD-10-CM | POA: Diagnosis not present

## 2022-07-05 DIAGNOSIS — M542 Cervicalgia: Secondary | ICD-10-CM | POA: Diagnosis not present

## 2022-07-05 DIAGNOSIS — G518 Other disorders of facial nerve: Secondary | ICD-10-CM | POA: Diagnosis not present

## 2022-07-06 NOTE — Telephone Encounter (Signed)
Prior Authorization for [facility] sent to [facility] via web portal. Tracking Number .  READY- NO PA REQ

## 2022-07-06 NOTE — Telephone Encounter (Signed)
I s/w the pt and she is agreeable to come by the office 07/10/22 11:30 to set up [place]. Pt has been sick and has not been able to set up sleep study yet.

## 2022-07-07 ENCOUNTER — Telehealth: Payer: Self-pay

## 2022-07-07 NOTE — Progress Notes (Unsigned)
Date: 07/07/22                 Patient Name: Jillian Rodgers, Jillian Rodgers                 DOB: February 26, 1946 Review office visits, consults, Medication changes and Chronic conditions for Initial CCM visit with CPP on 07/18/22 at [facility]    PCP- 05/10/22 [place], NP (PCP) - BP 146/96 HR 64. Stop:  Dexamethasone 4mg  , Triamcinolone acetonide 0.1%.  04/20/22 Dr. Melford Aase (PCP) - BP 138/80 HR 81. Start: Clobetasol propionate 0.05% apply to rash daily, Dexamethasone 4mg  - 1 tab 3x daily x3days, then 2x daily x3 days, then 1 tab daily  02/01/22 Dr. Melford Aase (PCP) - BP 104/62 HR 69. Change: Pantoprazole 40mg  -> 1 tab BID   Specialist- 05/16/22 Dr. Rogue Bussing (GYN) - BP 102/78. unable to note med changes from report  04/28/22 Dr. Gasper Sells (Cardio) - BP 146/86 HR 74. Start: Lisinopril 2.5mg  1 tab daily  02/27/22 Dr. Gershon Crane (Ophthalm) no med changes  02/14/22 Dr. Chryl Heck (Onc) - BP 134/96 HR 66. no med changes   Hospital visits in the last 6 months? 04/15/22 - [place] [facility] at [facility] [place] - no med changes noted from report  Time Spent: 40 min HC - Shon Hough, RMA [phone]   **Chart prep complete, will call pt 1-2 days prior to visit to confirm/complete pre-call questions.    07/13/22 11:24AM [facility] Total time spent: 47min  07/14/22 9:51AM [facility] Total time spent: 37min  07/17/22 10:17am Pt called requesting to reschedule initial [facility] visit from 07/18/22. Pt states she does not wish to rescheduled at this time. Pt states maybe another time.  Total time spent: 82min

## 2022-07-11 NOTE — Telephone Encounter (Signed)
LATE ENTRY: I s/w the pt yesterday who cancelled coming by the office to set up sleep study, Pt states she is still sick. We left that she will call next week and let me know when she will be able to come by the office to set up study.

## 2022-07-13 ENCOUNTER — Other Ambulatory Visit: Payer: Self-pay

## 2022-07-13 ENCOUNTER — Inpatient Hospital Stay: Payer: Medicare PPO

## 2022-07-13 ENCOUNTER — Inpatient Hospital Stay: Payer: Medicare PPO | Attending: Hematology and Oncology | Admitting: Hematology and Oncology

## 2022-07-13 VITALS — BP 121/73 | HR 86 | Temp 97.9°F | Resp 18 | Ht 64.0 in | Wt 152.1 lb

## 2022-07-13 DIAGNOSIS — D696 Thrombocytopenia, unspecified: Secondary | ICD-10-CM

## 2022-07-13 LAB — CMP (CANCER CENTER ONLY)
ALT: 9 U/L (ref 0–44)
AST: 15 U/L (ref 15–41)
Albumin: 4 g/dL (ref 3.5–5.0)
Alkaline Phosphatase: 77 U/L (ref 38–126)
Anion gap: 7 (ref 5–15)
BUN: 23 mg/dL (ref 8–23)
CO2: 29 mmol/L (ref 22–32)
Calcium: 10.6 mg/dL — ABNORMAL HIGH (ref 8.9–10.3)
Chloride: 104 mmol/L (ref 98–111)
Creatinine: 1.28 mg/dL — ABNORMAL HIGH (ref 0.44–1.00)
GFR, Estimated: 43 mL/min — ABNORMAL LOW (ref >60)
Glucose, Bld: 78 mg/dL (ref 70–99)
Potassium: 4.2 mmol/L (ref 3.5–5.1)
Sodium: 140 mmol/L (ref 135–145)
Total Bilirubin: 0.6 mg/dL (ref 0.3–1.2)
Total Protein: 6.9 g/dL (ref 6.5–8.1)

## 2022-07-13 LAB — CBC WITH DIFFERENTIAL/PLATELET
Abs Immature Granulocytes: 0.03 10*3/uL (ref 0.00–0.07)
Basophils Absolute: 0 10*3/uL (ref 0.0–0.1)
Basophils Relative: 1 %
Eosinophils Absolute: 0.2 10*3/uL (ref 0.0–0.5)
Eosinophils Relative: 4 %
HCT: 38.6 % (ref 36.0–46.0)
Hemoglobin: 12.9 g/dL (ref 12.0–15.0)
Immature Granulocytes: 1 %
Lymphocytes Relative: 20 %
Lymphs Abs: 1.3 10*3/uL (ref 0.7–4.0)
MCH: 30.6 pg (ref 26.0–34.0)
MCHC: 33.4 g/dL (ref 30.0–36.0)
MCV: 91.5 fL (ref 80.0–100.0)
Monocytes Absolute: 0.7 10*3/uL (ref 0.1–1.0)
Monocytes Relative: 10 %
Neutro Abs: 4.3 10*3/uL (ref 1.7–7.7)
Neutrophils Relative %: 64 %
Platelets: 60 10*3/uL — ABNORMAL LOW (ref 150–400)
RBC: 4.22 MIL/uL (ref 3.87–5.11)
RDW: 12.3 % (ref 11.5–15.5)
WBC: 6.6 10*3/uL (ref 4.0–10.5)
nRBC: 0 % (ref 0.0–0.2)

## 2022-07-13 LAB — FERRITIN: Ferritin: 44 ng/mL (ref 11–307)

## 2022-07-13 LAB — IRON AND IRON BINDING CAPACITY (CC-WL,HP ONLY)
Iron: 83 ug/dL (ref 28–170)
Saturation Ratios: 25 % (ref 10.4–31.8)
TIBC: 329 ug/dL (ref 250–450)
UIBC: 246 ug/dL (ref 148–442)

## 2022-07-13 NOTE — Progress Notes (Signed)
[place] [phone]   [phone]  PROGRESS NOTE  Patient Care Team: Unk Pinto, MD as PCP - General (Internal Medicine) Werner Lean, MD as PCP - Cardiology (Cardiology) Corliss Parish, [name]  CHIEF COMPLAINTS/PURPOSE OF CONSULTATION:  Thrombocytopenia  HISTORY OF PRESENTING ILLNESS:  Jillian Rodgers 77 y.o. female returns for a follow up for thrombocytopenia.   Jillian Rodgers is here for follow-up.  Since last visit, she has stopped blood thinners and is currently just being monitored by cardiology. She says she has been feeling more tired and sleeping much more than before.  She states this comes in episodes.  Besides fatigue, she denies any other B symptoms.  No hematochezia or melena.  No gingival bleeding or epistaxis. Rest of the pertinent 10 point ROS reviewed and negative  MEDICAL HISTORY:  Past Medical History:  Diagnosis Date   Anemia with low platelet count (HCC)    Chronic kidney disease    Degenerative joint disease    Dysrhythmia    a fib   GERD (gastroesophageal reflux disease)    History of kidney stones    Hyperlipidemia    Hypothyroidism    Inguinal hernia 02/19/2019   Labile hypertension    Migraines    neurontin helps   Pneumonia    hx of walking pneumonia    Primary hypertension 04/28/2022    SURGICAL HISTORY: Past Surgical History:  Procedure Laterality Date   BILATERAL CATARACT SURGERY      BLERPHOROPLASTY \     CHOLECYSTECTOMY  1996   open   COLONOSCOPY  2009   recommended 10 year f/u   HERNIA REPAIR  1985   umb hernia rpr   HERNIA REPAIR  2005 & 2009   ventral hernia rpr   INGUINAL HERNIA REPAIR Left 04/30/2020   Procedure: OPEN LEFT INGUINAL HERNIA REPAIR WITH MESH;  Surgeon: Clovis Riley, MD;  Location: [facility];  Service: General;  Laterality: Left;   INGUINAL HERNIA REPAIR Right 10/24/2021   Procedure: OPEN RIGHT INGUINAL HERNIA REPAIR  WITH MESH ;  Surgeon: Clovis Riley, MD;  Location: [facility];  Service: General;  Laterality: Right;   PILONIDAL CYST EXCISION  1972   SKIN CANCER REMOVAL      TONSILLECTOMY     UPPER GI ENDOSCOPY     VENTRAL HERNIA REPAIR      SOCIAL HISTORY: Social History   Socioeconomic History   Marital status: Married    Spouse name: Not on file   Number of children: 2   Years of education: 18   Highest education level: Not on file  Occupational History   Not on file  Tobacco Use   Smoking status: Never   Smokeless tobacco: Never  Vaping Use   Vaping Use: Never used  Substance and Sexual Activity   Alcohol use: No   Drug use: No   Sexual activity: Yes  Other Topics Concern   Not on file  Social History Narrative   Not on file   Social Determinants of Health   Financial Resource Strain: Not on file  Food Insecurity: Not on file  Transportation Needs: Not on file  Physical Activity: Not on file  Stress: Not on file  Social Connections: Not on file  Intimate Partner Violence: Not on file    FAMILY HISTORY: Family History  Problem Relation Age of Onset   Ovarian cancer Mother    Heart disease Mother    Cancer  Mother    Uterine cancer Mother    Hypertension Mother    Breast cancer Sister    Stroke Paternal Uncle    Congestive Heart Failure Father    Gout Maternal Aunt    Colon cancer Neg Hx    Esophageal cancer Neg Hx    Stomach cancer Neg Hx    Rectal cancer Neg Hx     ALLERGIES:  has No Known Allergies.  MEDICATIONS:  Current Outpatient Medications  Medication Sig Dispense Refill   acetaminophen (TYLENOL) 325 MG tablet Take 650 mg by mouth every 6 (six) hours as needed for moderate pain or headache.     Ascorbic Acid (VITAMIN C) 500 MG CAPS Take 500 mg by mouth daily.      BOTOX 100 units SOLR injection Inject 100 Units into the muscle See admin instructions. Inject 100 units intramuscularly every 6-8 weeks     cetirizine (ZYRTEC) 10 MG tablet Take 10 mg by  mouth daily.     Cholecalciferol (VITAMIN D) 125 MCG (5000 UT) CAPS Take 5,000 Units by mouth daily.     clobetasol ointment (TEMOVATE) 0.05 % Apply  to rash on shin  Daily  and cover with Saran wrap 60 g 3   gabapentin (NEURONTIN) 600 MG tablet Take 600 mg by mouth See admin instructions. Take 600 mg by mouth in morning and 1200 mg at bedtime     levothyroxine (SYNTHROID) 50 MCG tablet TAKE 1 TABLET BY MOUTH DAILY ON A EMPTY STOMACH WITH WATER. DO NOT TAKE ANY ANTACID, CALCIUM OR MAGNESIUM FOR 4 HOURS. AVOID BIOTIN 90 tablet 3   pantoprazole (PROTONIX) 40 MG tablet Take  1 tablet  2 x /day  for Acid Indigestion & Heartburn 180 tablet 3   Polyethyl Glycol-Propyl Glycol (SYSTANE ULTRA) 0.4-0.3 % SOLN Place 1 drop into both eyes 3 (three) times daily as needed (dry eyes).     pravastatin (PRAVACHOL) 40 MG tablet TAKE 1 TABLET BY MOUTH AT BEDTIME FOR CHOLESTEROL 90 tablet 3   Propylene Glycol (SYSTANE BALANCE) 0.6 % SOLN Place 1 drop into both eyes at bedtime.     zinc gluconate 50 MG tablet Take 50 mg by mouth daily.     No current facility-administered medications for this visit.    REVIEW OF SYSTEMS:   Constitutional: ( - ) fevers, ( - )  chills , ( - ) night sweats Eyes: ( - ) blurriness of vision, ( - ) double vision, ( - ) watery eyes Ears, nose, mouth, throat, and face: ( - ) mucositis, ( - ) sore throat Respiratory: ( - ) cough, ( - ) dyspnea, ( - ) wheezes Cardiovascular: ( - ) palpitation, ( - ) chest discomfort, ( - ) lower extremity swelling Gastrointestinal:  ( - ) nausea, ( - ) heartburn, ( - ) change in bowel habits Skin: ( - ) abnormal skin rashes Lymphatics: ( - ) new lymphadenopathy, ( - ) easy bruising Neurological: ( - ) numbness, ( - ) tingling, ( - ) new weaknesses Behavioral/Psych: ( - ) mood change, ( - ) new changes  All other systems were reviewed with the patient and are negative.  PHYSICAL EXAMINATION: ECOG PERFORMANCE STATUS: 0 - Asymptomatic  Vitals:    07/13/22 1208  BP: 121/73  Pulse: 86  Resp: 18  Temp: 97.9 F (36.6 C)  SpO2: 100%   Filed Weights   07/13/22 1208  Weight: 152 lb 1.6 oz (69 kg)    Physical Exam  Constitutional:      Appearance: Normal appearance.  Cardiovascular:     Rate and Rhythm: Normal rate and regular rhythm.     Pulses: Normal pulses.     Heart sounds: Normal heart sounds.  Pulmonary:     Effort: Pulmonary effort is normal.     Breath sounds: Normal breath sounds.  Abdominal:     General: Abdomen is flat. Bowel sounds are normal.     Palpations: Abdomen is soft.  Musculoskeletal:        General: No swelling. Normal range of motion.     Cervical back: Normal range of motion and neck supple. No rigidity.  Lymphadenopathy:     Cervical: No cervical adenopathy.  Skin:    General: Skin is warm and dry.  Neurological:     General: No focal deficit present.     Mental Status: She is alert.      LABORATORY DATA:  I have reviewed the data as listed    Latest Ref Rng & Units 05/10/2022    3:44 PM 02/14/2022   11:46 AM 02/01/2022    2:42 PM  CBC  WBC 3.8 - 10.8 Thousand/uL 6.4  6.1  6.8   Hemoglobin 11.7 - 15.5 g/dL 11.8  12.8  12.3   Hematocrit 35.0 - 45.0 % 35.5  37.9  36.6   Platelets 140 - 400 Thousand/uL 41  72  69        Latest Ref Rng & Units 05/10/2022    3:44 PM 02/01/2022    2:42 PM 10/20/2021    3:35 PM  CMP  Glucose 65 - 99 mg/dL 73  109  102   BUN 7 - 25 mg/dL 24  29  26    Creatinine 0.60 - 1.00 mg/dL 0.91  1.14  1.11   Sodium 135 - 146 mmol/L 140  140  142   Potassium 3.5 - 5.3 mmol/L 4.0  3.6  4.2   Chloride 98 - 110 mmol/L 106  105  108   CO2 20 - 32 mmol/L 25  25  26    Calcium 8.6 - 10.4 mg/dL 9.5  9.9  9.9   Total Protein 6.1 - 8.1 g/dL 5.8  6.8  6.4   Total Bilirubin 0.2 - 1.2 mg/dL 0.5  0.6  0.5   AST 10 - 35 U/L 11  18  18    ALT 6 - 29 U/L 10  14  13      ASSESSMENT & PLAN Jillian Rodgers is a 77 y.o. female who presents for a follow up for thrombocytopenia.    #Thrombocytopenia:  --Patient has undergone serologic workup on November 2021 without no evidence of hepatitis, hypothyroidism, nutritional deficiencies.  --MR of the abdomen from May 2021 shows no evidence of liver disease or splenomegaly.  --Bone marrow biopsy from 03/19/2020 showed hypercellularity, no evidence of dyspoeisis, megakaryocytic hyperplasia.  --Flow cytometry negative for any monoclonal B cell population. Cytogenetics was normal.  --Likely etiology is immune mediated versus early bone marrow disorder.  --She is not on any blood thinners at this time.  --ROS pertinent for fatigue, otherwise no complaints.  -- PE today unremarkable except for pallor.  -- We will repeat CBC, CMP, iron panel and ferritin today. --RTC in 3 months  No orders of the defined types were placed in this encounter.  All questions were answered. The patient knows to call the clinic with any problems, questions or concerns.  I have spent a total of 20  minutes  minutes of face-to-face and non-face-to-face time, preparing to see the patient, performing a medically appropriate examination, counseling and educating the patient, documenting clinical information in the electronic health record, and care coordination.   Benay Pike MD

## 2022-07-14 ENCOUNTER — Telehealth: Payer: Self-pay | Admitting: *Deleted

## 2022-07-14 NOTE — Telephone Encounter (Signed)
See other note

## 2022-07-14 NOTE — Telephone Encounter (Addendum)
-----   Message from Benay Pike, MD sent at 07/13/2022  4:47 PM EDT ----- Plts stable overall. I dont think her blood work necessarily explains fatigue, may be worth doing sleep test indeed.   This RN called pt and obtained identified VM- message left per above including to discuss the sleep study with her primary MD  ( Dr Wayland Denis) - this RN's name given for return call if needed.

## 2022-07-18 ENCOUNTER — Encounter: Payer: Medicare PPO | Admitting: Pharmacist

## 2022-07-25 ENCOUNTER — Encounter: Payer: Medicare PPO | Admitting: Internal Medicine

## 2022-08-14 DIAGNOSIS — G43719 Chronic migraine without aura, intractable, without status migrainosus: Secondary | ICD-10-CM | POA: Diagnosis not present

## 2022-08-14 DIAGNOSIS — M791 Myalgia, unspecified site: Secondary | ICD-10-CM | POA: Diagnosis not present

## 2022-08-14 DIAGNOSIS — G518 Other disorders of facial nerve: Secondary | ICD-10-CM | POA: Diagnosis not present

## 2022-08-14 DIAGNOSIS — M542 Cervicalgia: Secondary | ICD-10-CM | POA: Diagnosis not present

## 2022-08-22 ENCOUNTER — Ambulatory Visit: Payer: Medicare PPO | Admitting: Nurse Practitioner

## 2022-08-22 ENCOUNTER — Encounter: Payer: Self-pay | Admitting: Nurse Practitioner

## 2022-08-22 VITALS — BP 124/82 | HR 83 | Temp 97.9°F | Ht 64.0 in | Wt 157.0 lb

## 2022-08-22 DIAGNOSIS — R058 Other specified cough: Secondary | ICD-10-CM | POA: Diagnosis not present

## 2022-08-22 DIAGNOSIS — G44209 Tension-type headache, unspecified, not intractable: Secondary | ICD-10-CM

## 2022-08-22 DIAGNOSIS — J0110 Acute frontal sinusitis, unspecified: Secondary | ICD-10-CM

## 2022-08-22 MED ORDER — AZITHROMYCIN 250 MG PO TABS: ORAL_TABLET | ORAL | 1 refills | Status: DC

## 2022-08-22 MED ORDER — PROMETHAZINE-DM 6.25-15 MG/5ML PO SYRP: 5.0000 mL | ORAL_SOLUTION | Freq: Every evening | ORAL | 0 refills | Status: DC | PRN

## 2022-08-22 MED ORDER — BENZONATATE 100 MG PO CAPS: 100.0000 mg | ORAL_CAPSULE | Freq: Three times a day (TID) | ORAL | 1 refills | Status: DC | PRN

## 2022-08-22 NOTE — Progress Notes (Signed)
Assessment and Plan:  Jillian Rodgers was seen today for an episodic visit.  Diagnoses and all order for this visit:  Acute non-recurrent frontal sinusitis Start Z-Pak as directed Continue to stay well hydrated to keep mucus thin and productive.  - azithromycin (ZITHROMAX) 250 MG tablet; Take 2 tablets on  Day 1,  followed by 1 tablet  daily for 4 more days    for Sinusitis  /Bronchitis  Dispense: 6 each; Refill: 1  Cough productive of purulent sputum May take Promethazine cough syrup at night. Take Benzonatate cough perles during the day Push fluids Contact office if s/s fail to improve.  - promethazine-dextromethorphan (PROMETHAZINE-DM) 6.25-15 MG/5ML syrup; Take 5 mLs by mouth at bedtime as needed for cough.  Dispense: 240 mL; Refill: 0 - benzonatate (TESSALON PERLES) 100 MG capsule; Take 1 capsule (100 mg total) by mouth 3 (three) times daily as needed for cough.  Dispense: 30 capsule; Refill: 1  Acute non intractable tension-type headache Tylenol OTC as directed as needed Push fluids. Continue to monitor  Notify office for further evaluation and treatment, questions or concerns if s/s fail to improve. The risks and benefits of my recommendations, as well as other treatment options were discussed with the patient today. Questions were answered.  Further disposition pending results of labs. Discussed med's effects and SE's.    Over 15 minutes of exam, counseling, chart review, and critical decision making was performed.   Future Appointments  Date Time Provider Department Center  09/26/2022  2:00 PM [name], MD [facility] None  10/18/2022  1:30 PM Rachel Moulds, MD [facility] None  10/20/2022 11:00 AM [facility] DX DEXA 1 [facility]  05/10/2023  4:00 PM Bobbi Kozakiewicz, NP [facility] None    ------------------------------------------------------------------------------------------------------------------   HPI BP 124/82   Pulse 83   Temp 97.9 F (36.6  C)   Ht 5\' 4"  (1.626 m)   Wt 157 lb (71.2 kg)   SpO2 97%   BMI 26.95 kg/m    Patient complains of symptoms of a URI, possible sinusitis. Symptoms include achiness, congestion, cough described as productive, headache described as throbbing, nasal congestion, sinus pressure, sneezing, and sore throat. Onset of symptoms was 10 days ago, and has been unchanged since that time. Treatment to date: decongestants, Alka Seltzer.  States she has been out of town attending her grand-daughter's graduation.  Denies fever, chills, N/V, SOB.  Past Medical History:  Diagnosis Date   Anemia with low platelet count (HCC)    Chronic kidney disease    Degenerative joint disease    Dysrhythmia    a fib   GERD (gastroesophageal reflux disease)    History of kidney stones    Hyperlipidemia    Hypothyroidism    Inguinal hernia 02/19/2019   Labile hypertension    Migraines    neurontin helps   Pneumonia    hx of walking pneumonia    Primary hypertension 04/28/2022     No Known Allergies  Current Outpatient Medications on File Prior to Visit  Medication Sig   acetaminophen (TYLENOL) 325 MG tablet Take 650 mg by mouth every 6 (six) hours as needed for moderate pain or headache.   Ascorbic Acid (VITAMIN C) 500 MG CAPS Take 500 mg by mouth daily.    BOTOX 100 units SOLR injection Inject 100 Units into the muscle See admin instructions. Inject 100 units intramuscularly every 6-8 weeks   cetirizine (ZYRTEC) 10 MG tablet Take 10 mg by mouth daily.   Cholecalciferol (  VITAMIN D) 125 MCG (5000 UT) CAPS Take 5,000 Units by mouth daily.   clobetasol ointment (TEMOVATE) 0.05 % Apply  to rash on shin  Daily  and cover with Saran wrap   gabapentin (NEURONTIN) 600 MG tablet Take 600 mg by mouth See admin instructions. Take 600 mg by mouth in morning and 1200 mg at bedtime   levothyroxine (SYNTHROID) 50 MCG tablet TAKE 1 TABLET BY MOUTH DAILY ON A EMPTY STOMACH WITH WATER. DO NOT TAKE ANY ANTACID, CALCIUM OR  MAGNESIUM FOR 4 HOURS. AVOID BIOTIN   pantoprazole (PROTONIX) 40 MG tablet Take  1 tablet  2 x /day  for Acid Indigestion & Heartburn   Polyethyl Glycol-Propyl Glycol (SYSTANE ULTRA) 0.4-0.3 % SOLN Place 1 drop into both eyes 3 (three) times daily as needed (dry eyes).   pravastatin (PRAVACHOL) 40 MG tablet TAKE 1 TABLET BY MOUTH AT BEDTIME FOR CHOLESTEROL   Propylene Glycol (SYSTANE BALANCE) 0.6 % SOLN Place 1 drop into both eyes at bedtime.   zinc gluconate 50 MG tablet Take 50 mg by mouth daily.   No current facility-administered medications on file prior to visit.    ROS: all negative except what is noted in the HPI.   Physical Exam:  BP 124/82   Pulse 83   Temp 97.9 F (36.6 C)   Ht 5\' 4"  (1.626 m)   Wt 157 lb (71.2 kg)   SpO2 97%   BMI 26.95 kg/m   General Appearance: NAD.  Awake, conversant and cooperative. Eyes: PERRLA, EOMs intact.  Sclera white.  Conjunctiva without erythema. Sinuses: Frontal/maxillary tenderness.  No nasal discharge. Nares patent.  ENT/Mouth: Ext aud canals clear.  Bilateral TMs w/DOL and without erythema or bulging. Hearing intact.  Posterior pharynx without swelling or exudate.  Tonsils without swelling or erythema.  Neck: Supple.  No masses, nodules or thyromegaly. Respiratory: Effort is regular with non-labored breathing. Breath sounds are equal bilaterally without rales, rhonchi, wheezing or stridor.  Cardio: RRR with no MRGs. Brisk peripheral pulses without edema.  Abdomen: Active BS in all four quadrants.  Soft and non-tender without guarding, rebound tenderness, hernias or masses. Lymphatics: Non tender without lymphadenopathy.  Musculoskeletal: Full ROM, 5/5 strength, normal ambulation.  No clubbing or cyanosis. Skin: Appropriate color for ethnicity. Warm without rashes, lesions, ecchymosis, ulcers.  Neuro: CN II-XII grossly normal. Normal muscle tone without cerebellar symptoms and intact sensation.   Psych: AO X 3,  appropriate mood and  affect, insight and judgment.     Adela Glimpse, NP 2:45 PM [facility]

## 2022-08-22 NOTE — Patient Instructions (Signed)

## 2022-08-25 DIAGNOSIS — I7091 Generalized atherosclerosis: Secondary | ICD-10-CM | POA: Diagnosis not present

## 2022-08-25 DIAGNOSIS — L602 Onychogryphosis: Secondary | ICD-10-CM | POA: Diagnosis not present

## 2022-08-30 ENCOUNTER — Encounter: Payer: Self-pay | Admitting: Nurse Practitioner

## 2022-08-30 ENCOUNTER — Ambulatory Visit
Admission: RE | Admit: 2022-08-30 | Discharge: 2022-08-30 | Disposition: A | Discharge status: Home / Self Care | Payer: Medicare PPO | Source: Ambulatory Visit | Attending: Nurse Practitioner | Admitting: Nurse Practitioner

## 2022-08-30 ENCOUNTER — Ambulatory Visit: Payer: Medicare PPO | Admitting: Nurse Practitioner

## 2022-08-30 VITALS — BP 160/102 | HR 76 | Temp 97.7°F | Ht 64.0 in | Wt 156.2 lb

## 2022-08-30 DIAGNOSIS — R058 Other specified cough: Secondary | ICD-10-CM | POA: Diagnosis not present

## 2022-08-30 DIAGNOSIS — R062 Wheezing: Secondary | ICD-10-CM | POA: Diagnosis not present

## 2022-08-30 DIAGNOSIS — J069 Acute upper respiratory infection, unspecified: Secondary | ICD-10-CM

## 2022-08-30 DIAGNOSIS — K449 Diaphragmatic hernia without obstruction or gangrene: Secondary | ICD-10-CM | POA: Diagnosis not present

## 2022-08-30 DIAGNOSIS — J0110 Acute frontal sinusitis, unspecified: Secondary | ICD-10-CM

## 2022-08-30 DIAGNOSIS — R0981 Nasal congestion: Secondary | ICD-10-CM | POA: Diagnosis not present

## 2022-08-30 DIAGNOSIS — R059 Cough, unspecified: Secondary | ICD-10-CM | POA: Diagnosis not present

## 2022-08-30 MED ORDER — DOXYCYCLINE HYCLATE 100 MG PO TABS: 100.0000 mg | ORAL_TABLET | Freq: Two times a day (BID) | ORAL | 0 refills | Status: AC

## 2022-08-30 MED ORDER — IPRATROPIUM-ALBUTEROL 0.5-2.5 (3) MG/3ML IN SOLN: 3.0000 mL | Freq: Once | RESPIRATORY_TRACT | Status: DC

## 2022-08-30 NOTE — Patient Instructions (Signed)

## 2022-08-30 NOTE — Progress Notes (Signed)
Assessment and Plan:  Jillian Rodgers was seen today for an episodic visit.  Diagnoses and all order for this visit:  Upper respiratory tract infection, unspecified type/sinusitis Chest x-ray to assess for any underlying disease process given length of symptoms Start Doxycycline as directed. Duoneb administered - tolerated well.   - DG Chest 2 View; Future - doxycycline (VIBRA-TABS) 100 MG tablet; Take 1 tablet (100 mg total) by mouth 2 (two) times daily for 7 days.  Dispense: 14 tablet; Refill: 0 - ipratropium-albuterol (DUONEB) 0.5-2.5 (3) MG/3ML nebulizer solution 3 mL  Cough productive of purulent sputum Continue Promethazine, Benzonatate. Stay well hydrated to keep mucus thin and productive Chest x-ray to assess for any underlying disease process  - DG Chest 2 View; Future - doxycycline (VIBRA-TABS) 100 MG tablet; Take 1 tablet (100 mg total) by mouth 2 (two) times daily for 7 days.  Dispense: 14 tablet; Refill: 0  Wheezing  - DG Chest 2 View; Future - ipratropium-albuterol (DUONEB) 0.5-2.5 (3) MG/3ML nebulizer solution 3 mL  Notify office for further evaluation and treatment, questions or concerns if s/s fail to improve. The risks and benefits of my recommendations, as well as other treatment options were discussed with the patient today. Questions were answered.  Further disposition pending results of labs. Discussed med's effects and SE's.    Over 20 minutes of exam, counseling, chart review, and critical decision making was performed.   Future Appointments  Date Time Provider Department Center  09/26/2022  2:00 PM [name], MD [facility] None  10/18/2022  1:30 PM Rachel Moulds, MD [facility] None  10/20/2022 11:00 AM [facility] DX DEXA 1 [facility]  05/10/2023  4:00 PM Ishaan Villamar, NP [facility] None    ------------------------------------------------------------------------------------------------------------------   HPI BP (!) 160/102    Pulse 76   Temp 97.7 F (36.5 C)   Ht 5\' 4"  (1.626 m)   Wt 156 lb 3.2 oz (70.9 kg)   SpO2 99%   BMI 26.81 kg/m    Patient complains of symptoms of continues ongoing symptoms of a URI, possible sinusitis since being treated 08/22/22 . Symptoms continue to include achiness, congestion, cough described as productive, headache described as throbbing, nasal congestion, sinus pressure, sneezing, and sore throat. Treatment to date: decongestants, Alka Seltzer, Azithromycin, Promethazine, Benzonatate with minimal relief. Azithromycin course completed in full. Reports increase tightness in anterior chest.  Denies fever, chills, N/V, SOB.  Past Medical History:  Diagnosis Date   Anemia with low platelet count (HCC)    Chronic kidney disease    Degenerative joint disease    Dysrhythmia    a fib   GERD (gastroesophageal reflux disease)    History of kidney stones    Hyperlipidemia    Hypothyroidism    Inguinal hernia 02/19/2019   Labile hypertension    Migraines    neurontin helps   Pneumonia    hx of walking pneumonia    Primary hypertension 04/28/2022     No Known Allergies  Current Outpatient Medications on File Prior to Visit  Medication Sig   acetaminophen (TYLENOL) 325 MG tablet Take 650 mg by mouth every 6 (six) hours as needed for moderate pain or headache.   Ascorbic Acid (VITAMIN C) 500 MG CAPS Take 500 mg by mouth daily.    benzonatate (TESSALON PERLES) 100 MG capsule Take 1 capsule (100 mg total) by mouth 3 (three) times daily as needed for cough.   BOTOX 100 units SOLR injection Inject 100 Units into the muscle  See admin instructions. Inject 100 units intramuscularly every 6-8 weeks   cetirizine (ZYRTEC) 10 MG tablet Take 10 mg by mouth daily.   Cholecalciferol (VITAMIN D) 125 MCG (5000 UT) CAPS Take 5,000 Units by mouth daily.   clobetasol ointment (TEMOVATE) 0.05 % Apply  to rash on shin  Daily  and cover with Saran wrap   gabapentin (NEURONTIN) 600 MG tablet Take 600 mg by  mouth See admin instructions. Take 600 mg by mouth in morning and 1200 mg at bedtime   levothyroxine (SYNTHROID) 50 MCG tablet TAKE 1 TABLET BY MOUTH DAILY ON A EMPTY STOMACH WITH WATER. DO NOT TAKE ANY ANTACID, CALCIUM OR MAGNESIUM FOR 4 HOURS. AVOID BIOTIN   pantoprazole (PROTONIX) 40 MG tablet Take  1 tablet  2 x /day  for Acid Indigestion & Heartburn   Polyethyl Glycol-Propyl Glycol (SYSTANE ULTRA) 0.4-0.3 % SOLN Place 1 drop into both eyes 3 (three) times daily as needed (dry eyes).   pravastatin (PRAVACHOL) 40 MG tablet TAKE 1 TABLET BY MOUTH AT BEDTIME FOR CHOLESTEROL   promethazine-dextromethorphan (PROMETHAZINE-DM) 6.25-15 MG/5ML syrup Take 5 mLs by mouth at bedtime as needed for cough.   Propylene Glycol (SYSTANE BALANCE) 0.6 % SOLN Place 1 drop into both eyes at bedtime.   zinc gluconate 50 MG tablet Take 50 mg by mouth daily.   azithromycin (ZITHROMAX) 250 MG tablet Take 2 tablets on  Day 1,  followed by 1 tablet  daily for 4 more days    for Sinusitis  /Bronchitis   No current facility-administered medications on file prior to visit.    ROS: all negative except what is noted in the HPI.   Physical Exam:  BP (!) 160/102   Pulse 76   Temp 97.7 F (36.5 C)   Ht 5\' 4"  (1.626 m)   Wt 156 lb 3.2 oz (70.9 kg)   SpO2 99%   BMI 26.81 kg/m   General Appearance: NAD.  Awake, conversant and cooperative. Eyes: PERRLA, EOMs intact.  Sclera white.  Conjunctiva without erythema. Sinuses: Frontal/maxillary tenderness.  No nasal discharge. Nares patent.  ENT/Mouth: Ext aud canals clear.  Bilateral TMs w/DOL and without erythema or bulging. Hearing intact.  Posterior pharynx without swelling or exudate.  Tonsils without swelling or erythema.  Neck: Supple.  No masses, nodules or thyromegaly. Respiratory: Effort is regular with non-labored breathing. Breath sounds are equal bilaterally without rales, rhonchi, wheezing or stridor.  Cardio: RRR with no MRGs. Brisk peripheral pulses without  edema.  Abdomen: Active BS in all four quadrants.  Soft and non-tender without guarding, rebound tenderness, hernias or masses. Lymphatics: Non tender without lymphadenopathy.  Musculoskeletal: Full ROM, 5/5 strength, normal ambulation.  No clubbing or cyanosis. Skin: Appropriate color for ethnicity. Warm without rashes, lesions, ecchymosis, ulcers.  Neuro: CN II-XII grossly normal. Normal muscle tone without cerebellar symptoms and intact sensation.   Psych: AO X 3,  appropriate mood and affect, insight and judgment.     Adela Glimpse, NP 1:08 PM [place]

## 2022-09-13 ENCOUNTER — Encounter: Payer: Medicare PPO | Admitting: Internal Medicine

## 2022-09-26 ENCOUNTER — Encounter: Payer: Self-pay | Admitting: Internal Medicine

## 2022-09-26 ENCOUNTER — Ambulatory Visit (INDEPENDENT_AMBULATORY_CARE_PROVIDER_SITE_OTHER): Payer: Medicare PPO | Admitting: Internal Medicine

## 2022-09-26 VITALS — BP 100/62 | HR 82 | Temp 97.5°F | Resp 16 | Ht 62.5 in | Wt 155.2 lb

## 2022-09-26 DIAGNOSIS — Z1211 Encounter for screening for malignant neoplasm of colon: Secondary | ICD-10-CM

## 2022-09-26 DIAGNOSIS — Z0000 Encounter for general adult medical examination without abnormal findings: Secondary | ICD-10-CM | POA: Diagnosis not present

## 2022-09-26 DIAGNOSIS — M545 Low back pain, unspecified: Secondary | ICD-10-CM

## 2022-09-26 DIAGNOSIS — E782 Mixed hyperlipidemia: Secondary | ICD-10-CM

## 2022-09-26 DIAGNOSIS — K219 Gastro-esophageal reflux disease without esophagitis: Secondary | ICD-10-CM

## 2022-09-26 DIAGNOSIS — R7309 Other abnormal glucose: Secondary | ICD-10-CM

## 2022-09-26 DIAGNOSIS — G8929 Other chronic pain: Secondary | ICD-10-CM

## 2022-09-26 DIAGNOSIS — E039 Hypothyroidism, unspecified: Secondary | ICD-10-CM

## 2022-09-26 DIAGNOSIS — Z0001 Encounter for general adult medical examination with abnormal findings: Secondary | ICD-10-CM

## 2022-09-26 DIAGNOSIS — E559 Vitamin D deficiency, unspecified: Secondary | ICD-10-CM

## 2022-09-26 DIAGNOSIS — D696 Thrombocytopenia, unspecified: Secondary | ICD-10-CM | POA: Diagnosis not present

## 2022-09-26 DIAGNOSIS — R0989 Other specified symptoms and signs involving the circulatory and respiratory systems: Secondary | ICD-10-CM | POA: Diagnosis not present

## 2022-09-26 DIAGNOSIS — Z79899 Other long term (current) drug therapy: Secondary | ICD-10-CM | POA: Diagnosis not present

## 2022-09-26 DIAGNOSIS — Z136 Encounter for screening for cardiovascular disorders: Secondary | ICD-10-CM | POA: Diagnosis not present

## 2022-09-26 DIAGNOSIS — I480 Paroxysmal atrial fibrillation: Secondary | ICD-10-CM

## 2022-09-26 DIAGNOSIS — M816 Localized osteoporosis [Lequesne]: Secondary | ICD-10-CM

## 2022-09-26 DIAGNOSIS — Z8249 Family history of ischemic heart disease and other diseases of the circulatory system: Secondary | ICD-10-CM

## 2022-09-26 DIAGNOSIS — E663 Overweight: Secondary | ICD-10-CM

## 2022-09-26 MED ORDER — MELOXICAM 15 MG PO TABS
ORAL_TABLET | ORAL | 3 refills | Status: DC
Start: 2022-09-26 — End: 2022-10-18

## 2022-09-26 MED ORDER — PHENTERMINE HCL 37.5 MG PO TABS
ORAL_TABLET | ORAL | 1 refills | Status: DC
Start: 2022-09-26 — End: 2022-10-18

## 2022-09-26 MED ORDER — TOPIRAMATE 50 MG PO TABS
ORAL_TABLET | ORAL | 1 refills | Status: DC
Start: 2022-09-26 — End: 2022-10-18

## 2022-09-26 NOTE — Patient Instructions (Signed)

## 2022-09-26 NOTE — Progress Notes (Signed)
Annual Screening/Preventative Visit & Comprehensive Evaluation &  Examination   Future Appointments  Date Time Provider Department  09/26/2022                            cpe  2:00 PM [name], MD [facility]  10/18/2022  1:30 PM Rachel Moulds, MD [facility]  05/10/2023                             wellness  4:00 PM Adela Glimpse, NP [facility]  10/09/2023                            cpe  2:00 PM [name], MD [facility]        This very nice 77 y.o. MWF  presents for a Screening /Preventative Visit & comprehensive evaluation and management of multiple medical co-morbidities.  Patient has been followed for HTN,  CKD3a,  pAFib (10/2020),  HLD, Hypothyroidism, Prediabetes  and Vitamin D Deficiency. Patient is followed by Dr Burnice Logan Iruku (Hematologist )  for Thrombocytopenia. Patient also has GERD controlled on her Pantoprazole.         Labile HTN predates since the 1990's .  Patient was dx/d with pAFib (10/25/20)  (CHADsVASc 4) and underwent successful CV by Dr Pricilla Loveless and is followed by Cardiologist Dr Verdis Prime . Patient's BP has been controlled at home. Patient has CKD3a & is followed by Dr Annie Sable.  Patient denies any cardiac symptoms as chest pain, palpitations, shortness of breath, dizziness or ankle swelling. Today's BP is low normal at  100/62 .        Patient's hyperlipidemia is controlled with diet and Pravastatin . Patient denies myalgias or other medication SE's. Last lipids were at goal :  Lab Results  Component Value Date   CHOL 153 05/10/2022   HDL 65 05/10/2022   LDLCALC 70 05/10/2022   TRIG 94 05/10/2022   CHOLHDL 2.4 05/10/2022                                           Patient has been on thyroid replacement since she was dx'd with Hypothyroidism circa 1990's .       Patient has hx/o prediabetes (A1c 6.0% /2012) and patient denies reactive hypoglycemic symptoms, visual blurring, diabetic polys or paresthesias. Last A1c was  normal & at goal :  Lab Results  Component Value Date   HGBA1C 5.6 02/01/2022         Finally, patient has history of Vitamin D Deficiency and last Vitamin D was at goal :  Lab Results  Component Value Date   VD25OH 77 05/10/2022       Current Outpatient Medications on File Prior to Visit  Medication Sig   VITAMIN  C  500 MG C Take daily.    BOTOX 100 units SOLR injection For migraines   cetirizine  10 MG tablet Take daily.   VITAMIN D 5,000 Units  Take  daily.   TRELEGY ELLIPTA 200 Inhale 1 puff  daily.   gabapentin 600 MG tablet Take 900 mg - morning and 1200 mg - bedtime   levothyroxine 50 MCG tablet Take  1 tablet  Daily    MAXITROL SUSP Place 1 drop  into both eyes 4  times daily.    Pantoprazole 40 MG tablet Take  1 tablet  Daily     pravastatin  40 MG tablet TAKE 1 TABLET  AT BEDTIME   zinc 50 MG tablet Take daily.    No Known Allergies   Past Medical History:  Diagnosis Date   Anemia with low platelet count (HCC)    Degenerative joint disease    GERD (gastroesophageal reflux disease)    History of kidney stones    Hyperlipidemia    Hypertension    Hypothyroidism    Inguinal hernia 02/19/2019   Migraines    neurontin helps   Pneumonia    hx of walking pneumonia      Health Maintenance  Topic Date Due   Zoster Vaccines- Shingrix (1 of 2) Never done   Pneumonia Vaccine 43+ Years old (2 - PPSV23 if available, else PCV20) 10/28/2015   COVID-19 Vaccine (5 - Booster for Pfizer series) 09/17/2020   INFLUENZA VACCINE  11/15/2021   TETANUS/TDAP  11/11/2024   DEXA SCAN  Completed   Hepatitis C Screening  Completed   HPV VACCINES  Aged Out   Fecal DNA (Cologuard)  Discontinued     Immunization History  Administered Date(s) Administered   Influenza Inj Mdck Quad  01/15/2018   Influenza, High Dose  02/07/2018, 12/30/2018, 02/12/2020, 02/01/2021   Influenza 01/24/2011, 02/02/2012, 01/16/2015   PFIZER SARS-COV-2 Vacc 05/12/2019, 06/02/2019, 01/22/2020,  07/23/2020   Pneumococcal-13 10/28/2014   Pneumococcal-23 11/28/2011   Td 11/17/2010   Tdap 11/12/2014   Zoster, Live 11/17/2010    Last Cologard : 02/17/2021 - Negative - Recc 3 year f/u due Nov 2025.  Last MGM - 05/03/2021 - Negative  Last dexaBMD - 07/05/2020     T-3.2   Osteoporosis per Dr Philip Aspen -   Patient deferred recommended therapy - f/u scheduled 10/20/2022   Past Surgical History:  Procedure Laterality Date   BILATERAL CATARACT SURGERY      BLERPHOROPLASTY \     CHOLECYSTECTOMY  1996   open   COLONOSCOPY  2009   recommended 10 year f/u   HERNIA REPAIR  1985   umb hernia rpr   HERNIA REPAIR  2005 & 2009   ventral hernia rpr   INGUINAL HERNIA REPAIR Left 04/30/2020   Procedure: OPEN LEFT INGUINAL HERNIA REPAIR WITH MESH;  Surgeon: Berna Bue, MD;  Location: [facility];  Service: General;  Laterality: Left;   PILONIDAL CYST EXCISION  1972   SKIN CANCER REMOVAL      TONSILLECTOMY     UPPER GI ENDOSCOPY     VENTRAL HERNIA REPAIR       Family History  Problem Relation Age of Onset   Ovarian cancer Mother    Heart disease Mother    Cancer Mother    Uterine cancer Mother    Hypertension Mother    Breast cancer Sister    Stroke Paternal Uncle    Congestive Heart Failure Father    Gout Maternal Aunt    Colon cancer Neg Hx    Esophageal cancer Neg Hx    Stomach cancer Neg Hx    Rectal cancer Neg Hx      Social History   Tobacco Use   Smoking status: Never   Smokeless tobacco: Never  Vaping Use   Vaping Use: Never used  Substance Use Topics   Alcohol use: No   Drug use: No      ROS Constitutional: Denies  fever, chills, weight loss/gain, headaches, insomnia,  night sweats, and change in appetite. Does c/o fatigue. Eyes: Denies redness, blurred vision, diplopia, discharge, itchy, watery eyes.  ENT: Denies discharge, congestion, post nasal drip, epistaxis, sore throat, earache, hearing loss, dental pain, Tinnitus, Vertigo, Sinus pain,  snoring.  Cardio: Denies chest pain, palpitations, irregular heartbeat, syncope, dyspnea, diaphoresis, orthopnea, PND, claudication, edema Respiratory: denies cough, dyspnea, DOE, pleurisy, hoarseness, laryngitis, wheezing.  Gastrointestinal: Denies dysphagia, heartburn, reflux, water brash, pain, cramps, nausea, vomiting, bloating, diarrhea, constipation, hematemesis, melena, hematochezia, jaundice, hemorrhoids Genitourinary: Denies dysuria, frequency, urgency, nocturia, hesitancy, discharge, hematuria, flank pain Breast: Breast lumps, nipple discharge, bleeding.  Musculoskeletal: Denies arthralgia, myalgia, stiffness, Jt. Swelling, pain, limp, and strain/sprain. Denies falls. Skin: Denies puritis, rash, hives, warts, acne, eczema, changing in skin lesion Neuro: No weakness, tremor, incoordination, spasms, paresthesia, pain Psychiatric: Denies confusion, memory loss, sensory loss. Denies Depression. Endocrine: Denies change in weight, skin, hair change, nocturia, and paresthesia, diabetic polys, visual blurring, hyper / hypo glycemic episodes.  Heme/Lymph: No excessive bleeding, bruising, enlarged lymph nodes.  Physical Exam  BP 100/62   Pulse 82   Temp (!) 97.5 F (36.4 C)   Resp 16   Ht 5' 2.5" (1.588 m)   Wt 155 lb 3.2 oz (70.4 kg)   SpO2 95%   BMI 27.93 kg/m    Postural                     Sitting     BP 141/85    P 78                      &                    Standing     BP 110/78    P 84  General Appearance: over  nourished, well groomed and in no apparent distress.  Eyes: PERRLA, EOMs, conjunctiva no swelling or erythema, normal fundi and vessels. Sinuses: No frontal/maxillary tenderness ENT/Mouth: EACs patent / TMs  nl. Nares clear without erythema, swelling, mucoid exudates. Oral hygiene is good. No erythema, swelling, or exudate. Tongue normal, non-obstructing. Tonsils not swollen or erythematous. Hearing normal.  Neck: Supple, thyroid not palpable. No bruits, nodes or  JVD. Respiratory: Respiratory effort normal.  BS equal and clear bilateral without rales, rhonci, wheezing or stridor. Cardio: Heart sounds are normal with regular rate and rhythm and no murmurs, rubs or gallops. Peripheral pulses are normal and equal bilaterally without edema. No aortic or femoral bruits. Chest: symmetric with normal excursions and percussion. Breasts: Symmetric, without lumps, nipple discharge, retractions, or fibrocystic changes.  Abdomen: Flat, soft with bowel sounds active. Nontender, no guarding, rebound, hernias, masses, or organomegaly.  Lymphatics: Non tender without lymphadenopathy.  Genitourinary:  Musculoskeletal: Full ROM all peripheral extremities, joint stability, 5/5 strength, and normal gait. Skin: Warm and dry without rashes, lesions, cyanosis, clubbing or  ecchymosis.  Neuro: Cranial nerves intact, reflexes equal bilaterally. Normal muscle tone, no cerebellar symptoms. Sensation intact.  Pysch: Alert and oriented X 3, normal affect, Insight and Judgment appropriate.    Assessment and Plan  1. Annual Preventative Screening Examination   2. Labile hypertension  - EKG 12-Lead - Urinalysis, Routine w reflex microscopic - Microalbumin / creatinine urine ratio - CBC with Differential/Platelet - COMPLETE METABOLIC PANEL WITH GFR - Magnesium - TSH  3. Hyperlipidemia, mixed  - EKG 12-Lead - Lipid panel - TSH  4. Abnormal glucose  -  EKG 12-Lead - Hemoglobin A1c - Insulin, random  5. Vitamin D deficiency  - VITAMIN D 25 Hydroxy   6. Gastroesophageal reflux disease  - POC Hemoccult Bld/Stl   7. Paroxysmal atrial fibrillation (HCC)  - EKG 12-Lead - CBC with Differential/Platelet - TSH  8. Hypothyroidism  - TSH  9. Thrombocytopenia (HCC)  - CBC with Differential/Platelet  10. Localized osteoporosis    11. Screening for colorectal cancer  - POC Hemoccult Bld/Stl   12. Screening for heart disease  - EKG 12-Lead  13. FHx:  heart disease  - EKG 12-Lead  14. Medication management  - Urinalysis, Routine w reflex microscopic - Microalbumin / creatinine urine ratio - CBC with Differential/Platelet - COMPLETE METABOLIC PANEL WITH GFR - Magnesium - Lipid panel - TSH - Hemoglobin A1c - Insulin, random - VITAMIN D 25 Hydroxy            Patient was counseled in prudent diet to achieve/maintain BMI less than 25 for weight control, BP monitoring, regular exercise and medications. Discussed med's effects and SE's. Screening labs and tests as requested with regular follow-up as recommended. Over 40 minutes of exam, counseling, chart review and high complex critical decision making was performed.   Marinus Maw, MD

## 2022-09-27 LAB — COMPLETE METABOLIC PANEL WITH GFR
AG Ratio: 1.8 (calc) (ref 1.0–2.5)
ALT: 12 U/L (ref 6–29)
AST: 17 U/L (ref 10–35)
Albumin: 4.2 g/dL (ref 3.6–5.1)
Alkaline phosphatase (APISO): 67 U/L (ref 37–153)
BUN/Creatinine Ratio: 21 (calc) (ref 6–22)
BUN: 27 mg/dL — ABNORMAL HIGH (ref 7–25)
CO2: 28 mmol/L (ref 20–32)
Calcium: 10.4 mg/dL (ref 8.6–10.4)
Chloride: 107 mmol/L (ref 98–110)
Creat: 1.31 mg/dL — ABNORMAL HIGH (ref 0.60–1.00)
Globulin: 2.4 g/dL (calc) (ref 1.9–3.7)
Glucose, Bld: 94 mg/dL (ref 65–99)
Potassium: 4.4 mmol/L (ref 3.5–5.3)
Sodium: 142 mmol/L (ref 135–146)
Total Bilirubin: 0.5 mg/dL (ref 0.2–1.2)
Total Protein: 6.6 g/dL (ref 6.1–8.1)
eGFR: 42 mL/min/{1.73_m2} — ABNORMAL LOW (ref >=60)

## 2022-09-27 LAB — URINALYSIS, ROUTINE W REFLEX MICROSCOPIC
Bacteria, UA: NONE SEEN /HPF
Bilirubin Urine: NEGATIVE
Glucose, UA: NEGATIVE
Hgb urine dipstick: NEGATIVE
Nitrite: NEGATIVE
Specific Gravity, Urine: 1.024 (ref 1.001–1.035)
pH: 5.5 (ref 5.0–8.0)

## 2022-09-27 LAB — CBC WITH DIFFERENTIAL/PLATELET
Absolute Monocytes: 714 cells/uL (ref 200–950)
Basophils Absolute: 48 cells/uL (ref 0–200)
Basophils Relative: 0.7 %
Eosinophils Absolute: 88 cells/uL (ref 15–500)
Eosinophils Relative: 1.3 %
HCT: 38.8 % (ref 35.0–45.0)
Hemoglobin: 12.7 g/dL (ref 11.7–15.5)
Lymphs Abs: 1659 cells/uL (ref 850–3900)
MCH: 30.3 pg (ref 27.0–33.0)
MCHC: 32.7 g/dL (ref 32.0–36.0)
MCV: 92.6 fL (ref 80.0–100.0)
MPV: 12.2 fL (ref 7.5–12.5)
Monocytes Relative: 10.5 %
Neutro Abs: 4291 cells/uL (ref 1500–7800)
Neutrophils Relative %: 63.1 %
Platelets: 76 10*3/uL — ABNORMAL LOW (ref 140–400)
RBC: 4.19 10*6/uL (ref 3.80–5.10)
RDW: 13.2 % (ref 11.0–15.0)
Total Lymphocyte: 24.4 %
WBC: 6.8 10*3/uL (ref 3.8–10.8)

## 2022-09-27 LAB — TSH: TSH: 2.46 mIU/L (ref 0.40–4.50)

## 2022-09-27 LAB — LIPID PANEL
Cholesterol: 178 mg/dL (ref <200)
HDL: 79 mg/dL (ref >=50)
LDL Cholesterol (Calc): 76 mg/dL (calc)
Non-HDL Cholesterol (Calc): 99 mg/dL (calc) (ref <130)
Total CHOL/HDL Ratio: 2.3 (calc) (ref <5.0)
Triglycerides: 151 mg/dL — ABNORMAL HIGH (ref <150)

## 2022-09-27 LAB — HEMOGLOBIN A1C
Hgb A1c MFr Bld: 5.7 % of total Hgb — ABNORMAL HIGH (ref <5.7)
Mean Plasma Glucose: 117 mg/dL
eAG (mmol/L): 6.5 mmol/L

## 2022-09-27 LAB — INSULIN, RANDOM: Insulin: 17.7 u[IU]/mL

## 2022-09-27 LAB — VITAMIN D 25 HYDROXY (VIT D DEFICIENCY, FRACTURES): Vit D, 25-Hydroxy: 79 ng/mL (ref 30–100)

## 2022-09-27 LAB — MICROALBUMIN / CREATININE URINE RATIO
Creatinine, Urine: 202 mg/dL (ref 20–275)
Microalb Creat Ratio: 50 mg/g creat — ABNORMAL HIGH (ref <30)
Microalb, Ur: 10.1 mg/dL

## 2022-09-27 LAB — MAGNESIUM: Magnesium: 2.2 mg/dL (ref 1.5–2.5)

## 2022-09-28 ENCOUNTER — Other Ambulatory Visit: Payer: Self-pay | Admitting: Internal Medicine

## 2022-09-28 DIAGNOSIS — R8281 Pyuria: Secondary | ICD-10-CM

## 2022-09-28 NOTE — Progress Notes (Signed)
LMOM for patient to call for lab results and let her know that she will need to come by to leave another urine sample because there wasn't enough in her specimen to run a culture on. -e welch

## 2022-09-28 NOTE — Progress Notes (Signed)
^<^<^<^<^<^<^<^<^<^<^<^<^<^<^<^<^<^<^<^<^<^<^<^<^<^<^<^<^<^<^<^<^<^<^<^<^ ^>^>^>^>^>^>^>^>^>^>^>>^>^>^>^>^>^>^>^>^>^>^>^>^>^>^>^>^>^>^>^>^>^>^>^>^>  -    U/A is suspicious for a UTI - Will ask lab to do a culture for Infection.   ^<^<^<^<^<^<^<^<^<^<^<^<^<^<^<^<^<^<^<^<^<^<^<^<^<^<^<^<^<^<^<^<^<^<^<^<^ ^>^>^>^>^>^>^>^>^>^>^>^>^>^>^>^>^>^>^>^>^>^>^>^>^>^>^>^>^>^>^>^>^>^>^>^>^  - Kidney functions look a little dehydrated   -  Very important to drink adequate amounts of fluids to prevent permanent damage    - Recommend drink at least 6 bottles (16 ounces)                                                                            of fluids /water /day = 96 Oz ~100 oz  - 100 oz = 3,000 cc or 3 liters / day  - >>                                                        That's 1 &1/2 bottles of a 2 liter soda bottle /day !   ^<^<^<^<^<^<^<^<^<^<^<^<^<^<^<^<^<^<^<^<^<^<^<^<^<^<^<^<^<^<^<^<^<^<^<^<^ ^>^>^>^>^>^>^>^>^>^>^>^>^>^>^>^>^>^>^>^>^>^>^>^>^>^>^>^>^>^>^>^>^>^>^>^>^  - Chol = 178 -  Excellent   - Very low risk for Heart Attack  / Stroke  ^>^>^>^>^>^>^>^>^>^>^>^>^>^>^>^>^>^>^>^>^>^>^>^>^>^>^>^>^>^>^>^>^>^>^>^>^ ^>^>^>^>^>^>^>^>^>^>^>^>^>^>^>^>^>^>^>^>^>^>^>^>^>^>^>^>^>^>^>^>^>^>^>^>^  -  A1c = 5.7% - borderline elevated sugar, So recommend    - Avoid Sweets, Candy & White Stuff   - White Rice, White Christmas, White Flour  - Breads &  Pasta ^<^<^<^<^<^<^<^<^<^<^<^<^<^<^<^<^<^<^<^<^<^<^<^<^<^<^<^<^<^<^<^<^<^<^<^<^ ^>^>^>^>^>^>^>^>^>^>^>^>^>^>^>^>^>^>^>^>^>^>^>^>^>^>^>^>^>^>^>^>^>^>^>^>^  -  Vitamin D= 79 - Excellent - Please keep dose same   ^<^<^<^<^<^<^<^<^<^<^<^<^<^<^<^<^<^<^<^<^<^<^<^<^<^<^<^<^<^<^<^<^<^<^<^<^ ^>^>^>^>^>^>^>^>^>^>^>^>^>^>^>^>^>^>^>^>^>^>^>^>^>^>^>^>^>^>^>^>^>^>^>^>^  - All Else - CBC  - Electrolytes - Liver - Magnesium & Thyroid    - all  Normal /  OK ^<^<^<^<^<^<^<^<^<^<^<^<^<^<^<^<^<^<^<^<^<^<^<^<^<^<^<^<^<^<^<^<^<^<^<^<^ ^>^>^>^>^>^>^>^>^>^>^>^>^>^>^>^>^>^>^>^>^>^>^>^>^>^>^>^>^>^>^>^>^>^>^>^>^

## 2022-09-29 NOTE — Progress Notes (Signed)
Patient is aware of lab results and instructions. -e welch

## 2022-10-02 ENCOUNTER — Ambulatory Visit: Payer: Medicare PPO

## 2022-10-02 DIAGNOSIS — R8281 Pyuria: Secondary | ICD-10-CM | POA: Diagnosis not present

## 2022-10-03 LAB — URINE CULTURE
MICRO NUMBER:: 15092336
Result:: NO GROWTH
SPECIMEN QUALITY:: ADEQUATE

## 2022-10-03 NOTE — Progress Notes (Signed)
^<^<^<^<^<^<^<^<^<^<^<^<^<^<^<^<^<^<^<^<^<^<^<^<^<^<^<^<^<^<^<^<^<^<^<^<^ ^>^>^>^>^>^>^>^>^>^>^>>^>^>^>^>^>^>^>^>^>^>^>^>^>^>^>^>^>^>^>^>^>^>^>^>^>  -  Test results slightly outside the reference range are not unusual. If there is anything important, I will review this with you,  otherwise it is considered normal test values.  If you have further questions,  please do not hesitate to contact me at the office or via My Chart.   ^<^<^<^<^<^<^<^<^<^<^<^<^<^<^<^<^<^<^<^<^<^<^<^<^<^<^<^<^<^<^<^<^<^<^<^<^ ^>^>^>^>^>^>^>^>^>^>^>^>^>^>^>^>^>^>^>^>^>^>^>^>^>^>^>^>^>^>^>^>^>^>^>^>^  - Urine culture returned OK - No Infection   ^<^<^<^<^<^<^<^<^<^<^<^<^<^<^<^<^<^<^<^<^<^<^<^<^<^<^<^<^<^<^<^<^<^<^<^<^ ^>^>^>^>^>^>^>^>^>^>^>^>^>^>^>^>^>^>^>^>^>^>^>^>^>^>^>^>^>^>^>^>^>^>^>^>^

## 2022-10-04 DIAGNOSIS — M542 Cervicalgia: Secondary | ICD-10-CM | POA: Diagnosis not present

## 2022-10-04 DIAGNOSIS — G43019 Migraine without aura, intractable, without status migrainosus: Secondary | ICD-10-CM | POA: Diagnosis not present

## 2022-10-04 DIAGNOSIS — M791 Myalgia, unspecified site: Secondary | ICD-10-CM | POA: Diagnosis not present

## 2022-10-04 DIAGNOSIS — G518 Other disorders of facial nerve: Secondary | ICD-10-CM | POA: Diagnosis not present

## 2022-10-04 DIAGNOSIS — G43719 Chronic migraine without aura, intractable, without status migrainosus: Secondary | ICD-10-CM | POA: Diagnosis not present

## 2022-10-17 ENCOUNTER — Observation Stay (HOSPITAL_COMMUNITY): Payer: Medicare PPO

## 2022-10-17 ENCOUNTER — Emergency Department (HOSPITAL_BASED_OUTPATIENT_CLINIC_OR_DEPARTMENT_OTHER): Payer: Medicare PPO

## 2022-10-17 ENCOUNTER — Encounter (HOSPITAL_BASED_OUTPATIENT_CLINIC_OR_DEPARTMENT_OTHER): Payer: Self-pay | Admitting: Emergency Medicine

## 2022-10-17 ENCOUNTER — Observation Stay (HOSPITAL_BASED_OUTPATIENT_CLINIC_OR_DEPARTMENT_OTHER)
Admission: EM | Admit: 2022-10-17 | Discharge: 2022-10-18 | Disposition: A | Discharge status: Home / Self Care | Payer: Medicare PPO | Attending: Internal Medicine | Admitting: Internal Medicine

## 2022-10-17 ENCOUNTER — Other Ambulatory Visit: Payer: Self-pay

## 2022-10-17 ENCOUNTER — Telehealth: Payer: Self-pay

## 2022-10-17 DIAGNOSIS — E039 Hypothyroidism, unspecified: Secondary | ICD-10-CM | POA: Diagnosis present

## 2022-10-17 DIAGNOSIS — E785 Hyperlipidemia, unspecified: Secondary | ICD-10-CM | POA: Diagnosis present

## 2022-10-17 DIAGNOSIS — D696 Thrombocytopenia, unspecified: Secondary | ICD-10-CM | POA: Diagnosis present

## 2022-10-17 DIAGNOSIS — R2689 Other abnormalities of gait and mobility: Secondary | ICD-10-CM | POA: Insufficient documentation

## 2022-10-17 DIAGNOSIS — Z79899 Other long term (current) drug therapy: Secondary | ICD-10-CM | POA: Diagnosis not present

## 2022-10-17 DIAGNOSIS — G459 Transient cerebral ischemic attack, unspecified: Secondary | ICD-10-CM | POA: Diagnosis not present

## 2022-10-17 DIAGNOSIS — Z7901 Long term (current) use of anticoagulants: Secondary | ICD-10-CM | POA: Insufficient documentation

## 2022-10-17 DIAGNOSIS — I6782 Cerebral ischemia: Secondary | ICD-10-CM | POA: Diagnosis not present

## 2022-10-17 DIAGNOSIS — I129 Hypertensive chronic kidney disease with stage 1 through stage 4 chronic kidney disease, or unspecified chronic kidney disease: Secondary | ICD-10-CM | POA: Insufficient documentation

## 2022-10-17 DIAGNOSIS — I480 Paroxysmal atrial fibrillation: Secondary | ICD-10-CM | POA: Diagnosis not present

## 2022-10-17 DIAGNOSIS — R4701 Aphasia: Secondary | ICD-10-CM

## 2022-10-17 DIAGNOSIS — N1832 Chronic kidney disease, stage 3b: Secondary | ICD-10-CM | POA: Diagnosis not present

## 2022-10-17 DIAGNOSIS — R918 Other nonspecific abnormal finding of lung field: Secondary | ICD-10-CM | POA: Diagnosis not present

## 2022-10-17 DIAGNOSIS — D693 Immune thrombocytopenic purpura: Secondary | ICD-10-CM | POA: Diagnosis present

## 2022-10-17 DIAGNOSIS — G319 Degenerative disease of nervous system, unspecified: Secondary | ICD-10-CM | POA: Diagnosis not present

## 2022-10-17 DIAGNOSIS — R262 Difficulty in walking, not elsewhere classified: Secondary | ICD-10-CM | POA: Diagnosis not present

## 2022-10-17 DIAGNOSIS — R9089 Other abnormal findings on diagnostic imaging of central nervous system: Secondary | ICD-10-CM | POA: Diagnosis not present

## 2022-10-17 DIAGNOSIS — R29818 Other symptoms and signs involving the nervous system: Secondary | ICD-10-CM | POA: Diagnosis not present

## 2022-10-17 DIAGNOSIS — R519 Headache, unspecified: Secondary | ICD-10-CM | POA: Diagnosis not present

## 2022-10-17 DIAGNOSIS — R42 Dizziness and giddiness: Secondary | ICD-10-CM | POA: Diagnosis not present

## 2022-10-17 DIAGNOSIS — N1831 Chronic kidney disease, stage 3a: Secondary | ICD-10-CM | POA: Diagnosis present

## 2022-10-17 DIAGNOSIS — D509 Iron deficiency anemia, unspecified: Secondary | ICD-10-CM | POA: Diagnosis present

## 2022-10-17 DIAGNOSIS — R531 Weakness: Secondary | ICD-10-CM | POA: Diagnosis not present

## 2022-10-17 DIAGNOSIS — I10 Essential (primary) hypertension: Secondary | ICD-10-CM | POA: Diagnosis not present

## 2022-10-17 DIAGNOSIS — N183 Chronic kidney disease, stage 3 unspecified: Secondary | ICD-10-CM | POA: Diagnosis present

## 2022-10-17 LAB — BASIC METABOLIC PANEL
Anion gap: 9 (ref 5–15)
BUN: 23 mg/dL (ref 8–23)
CO2: 26 mmol/L (ref 22–32)
Calcium: 10.1 mg/dL (ref 8.9–10.3)
Chloride: 104 mmol/L (ref 98–111)
Creatinine, Ser: 1.27 mg/dL — ABNORMAL HIGH (ref 0.44–1.00)
GFR, Estimated: 43 mL/min — ABNORMAL LOW (ref >60)
Glucose, Bld: 129 mg/dL — ABNORMAL HIGH (ref 70–99)
Potassium: 4.4 mmol/L (ref 3.5–5.1)
Sodium: 139 mmol/L (ref 135–145)

## 2022-10-17 LAB — CBC WITH DIFFERENTIAL/PLATELET
Abs Immature Granulocytes: 0.03 10*3/uL (ref 0.00–0.07)
Basophils Absolute: 0 10*3/uL (ref 0.0–0.1)
Basophils Relative: 1 %
Eosinophils Absolute: 0.1 10*3/uL (ref 0.0–0.5)
Eosinophils Relative: 2 %
HCT: 42.5 % (ref 36.0–46.0)
Hemoglobin: 14.1 g/dL (ref 12.0–15.0)
Immature Granulocytes: 1 %
Lymphocytes Relative: 21 %
Lymphs Abs: 1.3 10*3/uL (ref 0.7–4.0)
MCH: 30.1 pg (ref 26.0–34.0)
MCHC: 33.2 g/dL (ref 30.0–36.0)
MCV: 90.6 fL (ref 80.0–100.0)
Monocytes Absolute: 0.6 10*3/uL (ref 0.1–1.0)
Monocytes Relative: 10 %
Neutro Abs: 4.2 10*3/uL (ref 1.7–7.7)
Neutrophils Relative %: 65 %
Platelets: 48 10*3/uL — ABNORMAL LOW (ref 150–400)
RBC: 4.69 MIL/uL (ref 3.87–5.11)
RDW: 12.3 % (ref 11.5–15.5)
WBC: 6.3 10*3/uL (ref 4.0–10.5)
nRBC: 0 % (ref 0.0–0.2)

## 2022-10-17 LAB — COMPREHENSIVE METABOLIC PANEL
ALT: 13 U/L (ref 0–44)
AST: 18 U/L (ref 15–41)
Albumin: 4.9 g/dL (ref 3.5–5.0)
Alkaline Phosphatase: 69 U/L (ref 38–126)
Anion gap: 10 (ref 5–15)
BUN: 30 mg/dL — ABNORMAL HIGH (ref 8–23)
CO2: 25 mmol/L (ref 22–32)
Calcium: 11.2 mg/dL — ABNORMAL HIGH (ref 8.9–10.3)
Chloride: 101 mmol/L (ref 98–111)
Creatinine, Ser: 1.4 mg/dL — ABNORMAL HIGH (ref 0.44–1.00)
GFR, Estimated: 39 mL/min — ABNORMAL LOW (ref >60)
Glucose, Bld: 88 mg/dL (ref 70–99)
Potassium: 4.4 mmol/L (ref 3.5–5.1)
Sodium: 136 mmol/L (ref 135–145)
Total Bilirubin: 0.8 mg/dL (ref 0.3–1.2)
Total Protein: 7.6 g/dL (ref 6.5–8.1)

## 2022-10-17 LAB — TROPONIN I (HIGH SENSITIVITY)
Troponin I (High Sensitivity): 4 ng/L (ref <18)
Troponin I (High Sensitivity): 4 ng/L (ref <18)

## 2022-10-17 LAB — CBG MONITORING, ED: Glucose-Capillary: 80 mg/dL (ref 70–99)

## 2022-10-17 MED ORDER — LABETALOL HCL 5 MG/ML IV SOLN
5.0000 mg | INTRAVENOUS | Status: DC | PRN
  Administered 2022-10-18: 5 mg via INTRAVENOUS
  Filled 2022-10-17: qty 4

## 2022-10-17 MED ORDER — ATORVASTATIN CALCIUM 80 MG PO TABS
80.0000 mg | ORAL_TABLET | Freq: Every day | ORAL | Status: DC
  Administered 2022-10-18: 80 mg via ORAL
  Filled 2022-10-17: qty 1

## 2022-10-17 MED ORDER — MELATONIN 5 MG PO TABS
5.0000 mg | ORAL_TABLET | Freq: Every evening | ORAL | Status: DC | PRN
  Filled 2022-10-17: qty 1

## 2022-10-17 MED ORDER — PROCHLORPERAZINE EDISYLATE 10 MG/2ML IJ SOLN: 5.0000 mg | Freq: Four times a day (QID) | INTRAMUSCULAR | Status: DC | PRN

## 2022-10-17 MED ORDER — IOHEXOL 350 MG/ML SOLN
100.0000 mL | Freq: Once | INTRAVENOUS | Status: AC | PRN
  Administered 2022-10-17: 60 mL via INTRAVENOUS

## 2022-10-17 MED ORDER — POLYETHYLENE GLYCOL 3350 17 G PO PACK: 17.0000 g | PACK | Freq: Every day | ORAL | Status: DC | PRN

## 2022-10-17 MED ORDER — ACETAMINOPHEN 325 MG PO TABS
650.0000 mg | ORAL_TABLET | Freq: Four times a day (QID) | ORAL | Status: DC | PRN
  Administered 2022-10-17: 650 mg via ORAL
  Filled 2022-10-17: qty 2

## 2022-10-17 MED ORDER — SODIUM CHLORIDE 0.9 % IV SOLN: INTRAVENOUS | Status: DC

## 2022-10-17 NOTE — ED Notes (Signed)
Upon [facility]'s arrival, pt stated she had approx 1-2 min episode of chest discomfort which has now subsided. 12 lead ECG obtained and EDP made aware. Pt in no obvious distress at this time.

## 2022-10-17 NOTE — ED Notes (Signed)
Jillian Rodgers with [facility] called for transport

## 2022-10-17 NOTE — Significant Event (Signed)
Accepted patient into [facility] via [facility]. Patient is alert and oriented X 4; belongings are clothes and shoes. Patient delay family notification as they are aware she is here. Patient oriented to room; call bell within reach.

## 2022-10-17 NOTE — Telephone Encounter (Signed)
Patient's husband Renae Fickle called to report that the patient was light-headed, passed out and was unable to speak twice this morning. After review of the patient's chart the patient's husband was advised to take the patient to the nearest emergency department for evaluation and treatment. The patient's husband verbalized clear understanding to this staff member.

## 2022-10-17 NOTE — ED Notes (Signed)
Patient transported to CT 

## 2022-10-17 NOTE — ED Provider Notes (Signed)
[place] EMERGENCY DEPARTMENT AT [facility] Provider Note   CSN: [id number] Arrival date & time: 10/17/22  1048     History  Chief Complaint  Patient presents with   Aphasia    Jillian Rodgers is a 77 y.o. female.  HPI Patient presented with difficulty speaking.  States was at home at around 930.  Was lightheaded and dizzy.  Has had a headache also.  Somewhat severe.  Has had issues with her blood pressure going low.  Thinks it may have been slow at the time.  Also states blood pressure was high at cardiovascular visit recently.  States she had changes in her speech.  States the thoughts appeared normal in her head but came out as either the wrong words or just gibberish.  No vision changes. States couple weeks ago had an episode where she was confused while the dentist.  States she was having trouble understanding the dates that someone was giving her.  No fevers.  Previous history of A-fib but not on anticoagulation.  Patient states she feels back to normal now.    Home Medications Prior to Admission medications   Medication Sig Start Date End Date Taking? Authorizing Provider  acetaminophen (TYLENOL) 325 MG tablet Take 650 mg by mouth every 6 (six) hours as needed for moderate pain or headache.    [provider]  Ascorbic Acid (VITAMIN C) 500 MG CAPS Take 500 mg by mouth daily.     [provider]  BOTOX 100 units SOLR injection Inject 100 Units into the muscle See admin instructions. Inject 100 units intramuscularly every 6-8 weeks 10/21/20   [provider]  cetirizine (ZYRTEC) 10 MG tablet Take 10 mg by mouth daily.    [provider]  Cholecalciferol (VITAMIN D) 125 MCG (5000 UT) CAPS Take 5,000 Units by mouth daily.    [provider]  gabapentin (NEURONTIN) 600 MG tablet Take 600 mg by mouth See admin instructions. Take 600 mg by mouth in morning and 1200 mg at bedtime    [provider]  levothyroxine  (SYNTHROID) 50 MCG tablet TAKE 1 TABLET BY MOUTH DAILY ON A EMPTY STOMACH WITH WATER. DO NOT TAKE ANY ANTACID, CALCIUM OR MAGNESIUM FOR 4 HOURS. AVOID BIOTIN 08/19/21   Judd Gaudier, NP  meloxicam (MOBIC) 15 MG tablet Take 1/2 to 1 tablet Daily for Pain & Inflammation 09/26/22   [name], MD  pantoprazole (PROTONIX) 40 MG tablet Take  1 tablet  2 x /day  for Acid Indigestion & Heartburn 02/01/22   [name], MD  phentermine (ADIPEX-P) 37.5 MG tablet Take 1/2 to 1 tablet every Morning for Dieting & Weight Loss 09/26/22   [name], MD  Polyethyl Glycol-Propyl Glycol (SYSTANE ULTRA) 0.4-0.3 % SOLN Place 1 drop into both eyes 3 (three) times daily as needed (dry eyes).    [provider]  pravastatin (PRAVACHOL) 40 MG tablet TAKE 1 TABLET BY MOUTH AT BEDTIME FOR CHOLESTEROL 05/19/22   Adela Glimpse, NP  Probiotic Product (PROBIOTIC PO) Take by mouth.    [provider]  Propylene Glycol (SYSTANE BALANCE) 0.6 % SOLN Place 1 drop into both eyes at bedtime.    [provider]  topiramate (TOPAMAX) 50 MG tablet Take 1/2 to 1 tablet 2 x /day at Suppertime & Bedtime for Dieting & Weight Loss 09/26/22   [name], MD  zinc gluconate 50 MG tablet Take 50 mg by mouth daily.    [provider]  Allergies    Patient has no known allergies.    Review of Systems   Review of Systems  Physical Exam Updated Vital Signs BP (!) 147/106   Pulse 87   Temp 98.1 F (36.7 C)   Resp 11   Ht 5' 2.5" (1.588 m)   Wt 67.1 kg   SpO2 96%   BMI 26.64 kg/m  Physical Exam Vitals reviewed.  Cardiovascular:     Rate and Rhythm: Regular rhythm.  Pulmonary:     Effort: Pulmonary effort is normal.  Abdominal:     Tenderness: There is no abdominal tenderness.  Neurological:     General: No focal deficit present.     Mental Status: She is alert and oriented to person, place, and time. Mental status is at baseline.     Comments: Awake and appropriate.   Eye movements intact.  Normal speech.  Moving all extremities.     ED Results / Procedures / Treatments   Labs (all labs ordered are listed, but only abnormal results are displayed) Labs Reviewed  COMPREHENSIVE METABOLIC PANEL - Abnormal; Notable for the following components:      Result Value   BUN 30 (*)    Creatinine, Ser 1.40 (*)    Calcium 11.2 (*)    GFR, Estimated 39 (*)    All other components within normal limits  CBC WITH DIFFERENTIAL/PLATELET - Abnormal; Notable for the following components:   Platelets 48 (*)    All other components within normal limits  CBG MONITORING, ED  TROPONIN I (HIGH SENSITIVITY)  TROPONIN I (HIGH SENSITIVITY)    EKG EKG Interpretation Date/Time:  Tuesday October 17 2022 11:02:26 EDT Ventricular Rate:  72 PR Interval:  142 QRS Duration:  78 QT Interval:  380 QTC Calculation: 416 R Axis:   -4  Text Interpretation: Normal sinus rhythm Possible Left atrial enlargement Nonspecific T wave abnormality Abnormal ECG When compared with ECG of 02-May-2021 14:37, No significant change was found Confirmed by Benjiman Core [phone]) on 10/17/2022 11:30:13 AM  Radiology CT ANGIO HEAD NECK W WO CM  Result Date: 10/17/2022 CLINICAL DATA:  Neuro deficit, acute, stroke suspected EXAM: CT ANGIOGRAPHY HEAD AND NECK WITH AND WITHOUT CONTRAST TECHNIQUE: Multidetector CT imaging of the head and neck was performed using the standard protocol during bolus administration of intravenous contrast. Multiplanar CT image reconstructions and MIPs were obtained to evaluate the vascular anatomy. Carotid stenosis measurements (when applicable) are obtained utilizing NASCET criteria, using the distal internal carotid diameter as the denominator. RADIATION DOSE REDUCTION: This exam was performed according to the departmental dose-optimization program which includes automated exposure control, adjustment of the mA and/or kV according to patient size and/or use of iterative  reconstruction technique. CONTRAST:  60mL OMNIPAQUE IOHEXOL 350 MG/ML SOLN COMPARISON:  None Available. FINDINGS: CT HEAD FINDINGS Brain: Age indeterminate infarct in the left caudate head (series 7, image 41), and in the right parietal lobe (series 7, image 18) no evidence of hemorrhage, hydrocephalus, extra-axial collection or mass lesion/mass effect. Sequela of moderate chronic microvascular ischemic change with generalized volume loss. Vascular: See below Skull: Normal. Negative for fracture or focal lesion. Sinuses/Orbits: No middle ear or mastoid effusion. Paranasal sinuses are clear. Bilateral lens replacement. Orbits are otherwise unremarkable. Other: None. Review of the MIP images confirms the above findings CTA NECK FINDINGS Aortic arch: Standard branching. Imaged portion shows no evidence of aneurysm or dissection. No significant stenosis of the major arch vessel origins. Right carotid system: No evidence of dissection,  stenosis (50% or greater), or occlusion. Left carotid system: No evidence of dissection, stenosis (50% or greater), or occlusion. Vertebral arteries: Codominant. No evidence of dissection, stenosis (50% or greater), or occlusion. Skeleton: Negative. Other neck: Negative. Upper chest: Negative. Review of the MIP images confirms the above findings CTA HEAD FINDINGS Anterior circulation: No significant stenosis, proximal occlusion, aneurysm, or vascular malformation. Posterior circulation: No significant stenosis, proximal occlusion, aneurysm, or vascular malformation. Venous sinuses: As permitted by contrast timing, patent. Anatomic variants: None Review of the MIP images confirms the above findings IMPRESSION: 1. Age indeterminate infarcts in the left caudate head and right parietal lobe. If there is concern for acute infarct, consider MRI for further evaluation. 2. No intracranial large vessel occlusion or significant stenosis. 3. No hemodynamically significant stenosis in the neck.  Electronically Signed   By: Lorenza Cambridge M.D.   On: 10/17/2022 12:57   DG Chest Portable 1 View  Result Date: 10/17/2022 CLINICAL DATA:  Weakness and lightheadedness, near syncope. Headache. EXAM: PORTABLE CHEST 1 VIEW COMPARISON:  08/30/2022 FINDINGS: Retrocardiac density compatible with moderate-sized hiatal hernia. Atherosclerotic aortic arch and descending thoracic aorta. Mild scarring or atelectasis along the lingula. The lungs appear otherwise clear. No blunting of the costophrenic angles. No acute bony findings. Right upper quadrant clips compatible with prior cholecystectomy. IMPRESSION: 1. Moderate-sized hiatal hernia. 2.  Aortic Atherosclerosis (ICD10-I70.0). 3. Mild scarring or atelectasis along the lingula. Electronically Signed   By: Gaylyn Rong M.D.   On: 10/17/2022 12:19    Procedures Procedures    Medications Ordered in ED Medications  iohexol (OMNIPAQUE) 350 MG/ML injection 100 mL (60 mLs Intravenous Contrast Given 10/17/22 1222)    ED Course/ Medical Decision Making/ A&P                             Medical Decision Making Amount and/or Complexity of Data Reviewed Labs: ordered. Radiology: ordered.  Risk Prescription drug management. Decision regarding hospitalization.   Headache and difficulty speaking.  Headache improved.  Also potentially was hypotensive at the time.  Differential diagnosis includes cranial hemorrhage, tia, stroke.  Will get head CT and basic blood work.  Will get CT angiography.  Reviewed PCP note from a couple weeks ago.  Had possible UTI but culture was negative.  Creatinine is mildly elevated but appears near baseline.  CBC does show thrombocytopenia which appears to be chronic for patient.  CT scan/CTA does show no large vessel disease but does have age-indeterminate strokes.  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  Patient potentially could have had some symptoms due to hypotension at the time although if anything patient is hypertensive now.  Will discuss with hospitalist for admission.          Final Clinical Impression(s) / ED Diagnoses Final diagnoses:  Aphasia    Rx / DC Orders ED Discharge Orders     None         Benjiman Core, MD 10/17/22 1329

## 2022-10-17 NOTE — Plan of Care (Signed)
  Problem: Education: Goal: Knowledge of General Education information will improve Description: Including pain rating scale, medication(s)/side effects and non-pharmacologic comfort measures Outcome: Progressing   Problem: Skin Integrity: Goal: Risk for impaired skin integrity will decrease Outcome: Progressing   Problem: Education: Goal: Knowledge of disease or condition will improve Outcome: Progressing Goal: Knowledge of secondary prevention will improve (MUST DOCUMENT ALL) Outcome: Progressing

## 2022-10-17 NOTE — ED Notes (Signed)
EDP at bedside. Updates patient and family.

## 2022-10-17 NOTE — ED Triage Notes (Signed)
Pt arrives pov, steady gait to triage with husband reporting.  Reports pt was lightheaded at ~ 0930, unable to complete sentences with slurred speech. Reports similar symptoms last week. AOx4, speech clear. Denies weakness, endorses "severe" HA at ~ same time. No facial droop noted Bilaterally equal, VAN neg. 1 g Tylenol at 10am

## 2022-10-17 NOTE — H&P (Signed)
History and Physical  MARIZA HOVAN NWG:[id number] DOB: [birth year] DOA: 10/17/2022  Referring physician: Accepted by Dr. Maryfrances Bunnell [facility], hospitalist service. PCP: [name], MD  Outpatient Specialists: Hematology oncology, cardiology. Patient coming from: Home.  Chief Complaint: Aphasia  HPI: Jillian Rodgers is a 77 y.o. female with medical history significant for chronic thrombocytopenia followed by hematology, history of paroxysmal atrial fibrillation not on anticoagulation due to chronic thrombocytopenia, hyperlipidemia, hypothyroidism, GERD, CKD 3A, who initially presented to [facility] ED due to inability to get her words out.  This happened around 9:30 AM.  Per the patient it lasted about 10 to 15 minutes.  Associated with slurred speech, lightheadedness, and headache.  Reportedly a couple of weeks ago, the patient had an episode of confusion while at the dentist office.  She was having difficulty understanding speech.  In the ED, her symptoms had resolved.  Due to concern for possible TIA or CVA, the patient had a CT angio head and neck which revealed the following findings:  1. Age indeterminate infarcts in the left caudate head and right parietal lobe. If there is concern for acute infarct, consider MRI for further evaluation. 2. No intracranial large vessel occlusion or significant stenosis. 3. No hemodynamically significant stenosis in the neck.  EDP discussed the case with [facility], Dr. Maryfrances Bunnell, hospitalist service, who accepted the patient in transfer to [facility]  The patient was admitted to [facility] telemetry medical unit as observation status.  ED Course: Tmax 98.3.  BP 186/110, pulse 70, respiration rate 13, saturation 99% on room air.  Lab studies notable for BUN 30, creatinine 1.40 with GFR of 39.  Platelet count 48 K.  Review of Systems: Review of systems as noted in the HPI. All other systems reviewed and are negative.   Past Medical  History:  Diagnosis Date   Anemia with low platelet count (HCC)    Chronic kidney disease    Degenerative joint disease    Dysrhythmia    a fib   GERD (gastroesophageal reflux disease)    History of kidney stones    Hyperlipidemia    Hypothyroidism    Inguinal hernia 02/19/2019   Labile hypertension    Migraines    neurontin helps   Pneumonia    hx of walking pneumonia    Primary hypertension 04/28/2022   Past Surgical History:  Procedure Laterality Date   BILATERAL CATARACT SURGERY      BLERPHOROPLASTY \     CHOLECYSTECTOMY  1996   open   COLONOSCOPY  2009   recommended 10 year f/u   HERNIA REPAIR  1985   umb hernia rpr   HERNIA REPAIR  2005 & 2009   ventral hernia rpr   INGUINAL HERNIA REPAIR Left 04/30/2020   Procedure: OPEN LEFT INGUINAL HERNIA REPAIR WITH MESH;  Surgeon: Berna Bue, MD;  Location: [facility] ORS;  Service: General;  Laterality: Left;   INGUINAL HERNIA REPAIR Right 10/24/2021   Procedure: OPEN RIGHT INGUINAL HERNIA REPAIR WITH MESH ;  Surgeon: Berna Bue, MD;  Location: [facility] ORS;  Service: General;  Laterality: Right;   PILONIDAL CYST EXCISION  1972   SKIN CANCER REMOVAL      TONSILLECTOMY     UPPER GI ENDOSCOPY     VENTRAL HERNIA REPAIR      Social History:  reports that she has never smoked. She has never used smokeless tobacco. She reports that she does not drink alcohol and does not use drugs.  No Known Allergies  Family History  Problem Relation Age of Onset   Ovarian cancer Mother    Heart disease Mother    Cancer Mother    Uterine cancer Mother    Hypertension Mother    Breast cancer Sister    Stroke Paternal Uncle    Congestive Heart Failure Father    Gout Maternal Aunt    Colon cancer Neg Hx    Esophageal cancer Neg Hx    Stomach cancer Neg Hx    Rectal cancer Neg Hx       Prior to Admission medications   Medication Sig Start Date End Date Taking? Authorizing Provider  acetaminophen (TYLENOL) 325 MG tablet Take 650 mg  by mouth every 6 (six) hours as needed for moderate pain or headache.   Yes [provider]  Ascorbic Acid (VITAMIN C) 500 MG CAPS Take 500 mg by mouth daily.    Yes [provider]  BOTOX 100 units SOLR injection Inject 100 Units into the muscle See admin instructions. Inject 100 units intramuscularly every 6-8 weeks 10/21/20  Yes [provider]  cetirizine (ZYRTEC) 10 MG tablet Take 10 mg by mouth daily.   Yes [provider]  Cholecalciferol (VITAMIN D) 125 MCG (5000 UT) CAPS Take 5,000 Units by mouth daily.   Yes [provider]  gabapentin (NEURONTIN) 600 MG tablet Take 600 mg by mouth See admin instructions. Take 600 mg by mouth in morning and 1200 mg at bedtime   Yes [provider]  levothyroxine (SYNTHROID) 50 MCG tablet TAKE 1 TABLET BY MOUTH DAILY ON A EMPTY STOMACH WITH WATER. DO NOT TAKE ANY ANTACID, CALCIUM OR MAGNESIUM FOR 4 HOURS. AVOID BIOTIN 08/19/21  Yes Judd Gaudier, NP  pantoprazole (PROTONIX) 40 MG tablet Take  1 tablet  2 x /day  for Acid Indigestion & Heartburn 02/01/22  Yes [name], MD  phentermine (ADIPEX-P) 37.5 MG tablet Take 1/2 to 1 tablet every Morning for Dieting & Weight Loss 09/26/22  Yes [name], MD  Polyethyl Glycol-Propyl Glycol (SYSTANE ULTRA) 0.4-0.3 % SOLN Place 1 drop into both eyes 3 (three) times daily as needed (dry eyes).   Yes [provider]  pravastatin (PRAVACHOL) 40 MG tablet TAKE 1 TABLET BY MOUTH AT BEDTIME FOR CHOLESTEROL 05/19/22  Yes Cranford, Archie Patten, NP  Probiotic Product (PROBIOTIC PO) Take 1 tablet by mouth daily.   Yes [provider]  Propylene Glycol (SYSTANE BALANCE) 0.6 % SOLN Place 1 drop into both eyes at bedtime.   Yes [provider]  zinc gluconate 50 MG tablet Take 50 mg by mouth daily.   Yes [provider]  meloxicam (MOBIC) 15 MG tablet Take 1/2 to 1 tablet Daily for Pain & Inflammation 09/26/22   [name], MD  topiramate  (TOPAMAX) 50 MG tablet Take 1/2 to 1 tablet 2 x /day at Suppertime & Bedtime for Dieting & Weight Loss 09/26/22   [name], MD    Physical Exam: BP (!) 186/110 (BP Location: Right Arm)   Pulse 70   Temp 98.3 F (36.8 C) (Oral)   Resp 13   Ht 5' 2.5" (1.588 m)   Wt 67.1 kg   SpO2 99%   BMI 26.64 kg/m   General: 77 y.o. year-old female well developed well nourished in no acute distress.  Alert and oriented x3. Cardiovascular: Regular rate and rhythm with no rubs or gallops.  No thyromegaly or JVD noted.  No lower extremity edema. 2/4 pulses  in all 4 extremities. Respiratory: Clear to auscultation with no wheezes or rales. Good inspiratory effort. Abdomen: Soft nontender nondistended with normal bowel sounds x4 quadrants. Muskuloskeletal: No cyanosis, clubbing or edema noted bilaterally Neuro: CN II-XII intact, strength, sensation, reflexes Skin: No ulcerative lesions noted or rashes Psychiatry: Judgement and insight appear normal. Mood is appropriate for condition and setting          Labs on Admission:  Basic Metabolic Panel: Recent Labs  Lab 10/17/22 1129  NA 136  K 4.4  CL 101  CO2 25  GLUCOSE 88  BUN 30*  CREATININE 1.40*  CALCIUM 11.2*   Liver Function Tests: Recent Labs  Lab 10/17/22 1129  AST 18  ALT 13  ALKPHOS 69  BILITOT 0.8  PROT 7.6  ALBUMIN 4.9   No results for input(s): "LIPASE", "AMYLASE" in the last 168 hours. No results for input(s): "AMMONIA" in the last 168 hours. CBC: Recent Labs  Lab 10/17/22 1129  WBC 6.3  NEUTROABS 4.2  HGB 14.1  HCT 42.5  MCV 90.6  PLT 48*   Cardiac Enzymes: No results for input(s): "CKTOTAL", "CKMB", "CKMBINDEX", "TROPONINI" in the last 168 hours.  BNP (last 3 results) No results for input(s): "BNP" in the last 8760 hours.  ProBNP (last 3 results) No results for input(s): "PROBNP" in the last 8760 hours.  CBG: Recent Labs  Lab 10/17/22 1055  GLUCAP 80    Radiological Exams on Admission: CT  ANGIO HEAD NECK W WO CM  Result Date: 10/17/2022 CLINICAL DATA:  Neuro deficit, acute, stroke suspected EXAM: CT ANGIOGRAPHY HEAD AND NECK WITH AND WITHOUT CONTRAST TECHNIQUE: Multidetector CT imaging of the head and neck was performed using the standard protocol during bolus administration of intravenous contrast. Multiplanar CT image reconstructions and MIPs were obtained to evaluate the vascular anatomy. Carotid stenosis measurements (when applicable) are obtained utilizing NASCET criteria, using the distal internal carotid diameter as the denominator. RADIATION DOSE REDUCTION: This exam was performed according to the departmental dose-optimization program which includes automated exposure control, adjustment of the mA and/or kV according to patient size and/or use of iterative reconstruction technique. CONTRAST:  60mL OMNIPAQUE IOHEXOL 350 MG/ML SOLN COMPARISON:  None Available. FINDINGS: CT HEAD FINDINGS Brain: Age indeterminate infarct in the left caudate head (series 7, image 59), and in the right parietal lobe (series 7, image 18) no evidence of hemorrhage, hydrocephalus, extra-axial collection or mass lesion/mass effect. Sequela of moderate chronic microvascular ischemic change with generalized volume loss. Vascular: See below Skull: Normal. Negative for fracture or focal lesion. Sinuses/Orbits: No middle ear or mastoid effusion. Paranasal sinuses are clear. Bilateral lens replacement. Orbits are otherwise unremarkable. Other: None. Review of the MIP images confirms the above findings CTA NECK FINDINGS Aortic arch: Standard branching. Imaged portion shows no evidence of aneurysm or dissection. No significant stenosis of the major arch vessel origins. Right carotid system: No evidence of dissection, stenosis (50% or greater), or occlusion. Left carotid system: No evidence of dissection, stenosis (50% or greater), or occlusion. Vertebral arteries: Codominant. No evidence of dissection, stenosis (50% or  greater), or occlusion. Skeleton: Negative. Other neck: Negative. Upper chest: Negative. Review of the MIP images confirms the above findings CTA HEAD FINDINGS Anterior circulation: No significant stenosis, proximal occlusion, aneurysm, or vascular malformation. Posterior circulation: No significant stenosis, proximal occlusion, aneurysm, or vascular malformation. Venous sinuses: As permitted by contrast timing, patent. Anatomic variants: None Review of the MIP images confirms the above findings IMPRESSION: 1. Age indeterminate infarcts in  the left caudate head and right parietal lobe. If there is concern for acute infarct, consider MRI for further evaluation. 2. No intracranial large vessel occlusion or significant stenosis. 3. No hemodynamically significant stenosis in the neck. Electronically Signed   By: Lorenza Cambridge M.D.   On: 10/17/2022 12:57   DG Chest Portable 1 View  Result Date: 10/17/2022 CLINICAL DATA:  Weakness and lightheadedness, near syncope. Headache. EXAM: PORTABLE CHEST 1 VIEW COMPARISON:  08/30/2022 FINDINGS: Retrocardiac density compatible with moderate-sized hiatal hernia. Atherosclerotic aortic arch and descending thoracic aorta. Mild scarring or atelectasis along the lingula. The lungs appear otherwise clear. No blunting of the costophrenic angles. No acute bony findings. Right upper quadrant clips compatible with prior cholecystectomy. IMPRESSION: 1. Moderate-sized hiatal hernia. 2.  Aortic Atherosclerosis (ICD10-I70.0). 3. Mild scarring or atelectasis along the lingula. Electronically Signed   By: Gaylyn Rong M.D.   On: 10/17/2022 12:19    EKG: I independently viewed the EKG done and my findings are as followed: Sinus rhythm rate of 67.  Nonspecific ST-T changes.  QTc 433.  Assessment/Plan Present on Admission:  TIA (transient ischemic attack)  Principal Problem:   TIA (transient ischemic attack)  TIA vs CVA Presented with expressive aphasia, lasting 10 to 15  minutes. Admitted for stroke work up  Neurology consulted CTA head and neck without significant LVO, revealed old strokes. Noncontrast MRI brain 2D echo with bubble study Frequent neurochecks Permissive hypertension, treat SBP greater than 220 or DBP greater than 120 PT/OT/speech therapist evaluation LDL 76, goal less than 70 Z6X 5.7, goal less than 7.0. Defer antiplatelets to neurology. Recommend high intensity statin  Elevated blood pressures Currently not on oral antihypertensives prior to admission BPs have been elevated Ongoing permissive hypertension Treat SBP greater than 220 or DBP greater than 120. Closely monitor vital signs.  CKD 3 A Appears to be at her baseline renal function Gentle IV fluid hydration NS at 150 cc/h x 1 day Monitor urine output Repeat BMP in the morning  Hyperlipidemia Goal LDL less than 70 High intensity statin.  Hypothyroidism Resume home levothyroxine  Headaches Resume home regimen  GERD Resume home regimen   Time: 75 minutes.    DVT prophylaxis: SCDs.  Code Status: Full code.  Family Communication: Updated her husband and daughter at bedside.  Disposition Plan: Admitted to telemetry medical unit  Consults called: Neurology/stroke team  Admission status: Observation status.   Status is: Observation    Darlin Drop MD [facility] Pager [phone]  If 7PM-7AM, please contact night-coverage [email or web address] Password [facility]  10/17/2022, 7:26 PM

## 2022-10-17 NOTE — ED Notes (Signed)
Pt placed in supine position for orthostatic vitals. Will get orthostatic vitals in about 10 minutes.

## 2022-10-18 ENCOUNTER — Observation Stay (HOSPITAL_BASED_OUTPATIENT_CLINIC_OR_DEPARTMENT_OTHER): Payer: Medicare PPO

## 2022-10-18 ENCOUNTER — Observation Stay (HOSPITAL_COMMUNITY): Payer: Medicare PPO

## 2022-10-18 ENCOUNTER — Inpatient Hospital Stay: Payer: Medicare PPO | Admitting: Hematology and Oncology

## 2022-10-18 ENCOUNTER — Telehealth: Payer: Self-pay | Admitting: Physician Assistant

## 2022-10-18 DIAGNOSIS — E038 Other specified hypothyroidism: Secondary | ICD-10-CM

## 2022-10-18 DIAGNOSIS — R569 Unspecified convulsions: Secondary | ICD-10-CM

## 2022-10-18 DIAGNOSIS — N1832 Chronic kidney disease, stage 3b: Secondary | ICD-10-CM

## 2022-10-18 DIAGNOSIS — G459 Transient cerebral ischemic attack, unspecified: Secondary | ICD-10-CM

## 2022-10-18 DIAGNOSIS — R4701 Aphasia: Secondary | ICD-10-CM | POA: Diagnosis not present

## 2022-10-18 LAB — BASIC METABOLIC PANEL
Anion gap: 8 (ref 5–15)
BUN: 18 mg/dL (ref 8–23)
CO2: 26 mmol/L (ref 22–32)
Calcium: 9.8 mg/dL (ref 8.9–10.3)
Chloride: 104 mmol/L (ref 98–111)
Creatinine, Ser: 1.17 mg/dL — ABNORMAL HIGH (ref 0.44–1.00)
GFR, Estimated: 48 mL/min — ABNORMAL LOW (ref >60)
Glucose, Bld: 90 mg/dL (ref 70–99)
Potassium: 3.8 mmol/L (ref 3.5–5.1)
Sodium: 138 mmol/L (ref 135–145)

## 2022-10-18 LAB — CBC
HCT: 39.5 % (ref 36.0–46.0)
Hemoglobin: 13.1 g/dL (ref 12.0–15.0)
MCH: 29.7 pg (ref 26.0–34.0)
MCHC: 33.2 g/dL (ref 30.0–36.0)
MCV: 89.6 fL (ref 80.0–100.0)
Platelets: 41 10*3/uL — ABNORMAL LOW (ref 150–400)
RBC: 4.41 MIL/uL (ref 3.87–5.11)
RDW: 12.2 % (ref 11.5–15.5)
WBC: 5.3 10*3/uL (ref 4.0–10.5)
nRBC: 0 % (ref 0.0–0.2)

## 2022-10-18 LAB — MAGNESIUM: Magnesium: 2.3 mg/dL (ref 1.7–2.4)

## 2022-10-18 LAB — ECHOCARDIOGRAM COMPLETE BUBBLE STUDY
Area-P 1/2: 3.53 cm2
S' Lateral: 2.3 cm

## 2022-10-18 MED ORDER — GABAPENTIN 400 MG PO CAPS: 1200.0000 mg | ORAL_CAPSULE | Freq: Every day | ORAL | Status: DC

## 2022-10-18 MED ORDER — ASPIRIN 81 MG PO TBEC: 81.0000 mg | DELAYED_RELEASE_TABLET | Freq: Every day | ORAL | 12 refills | Status: DC

## 2022-10-18 MED ORDER — PANTOPRAZOLE SODIUM 40 MG PO TBEC
40.0000 mg | DELAYED_RELEASE_TABLET | Freq: Two times a day (BID) | ORAL | Status: DC
  Administered 2022-10-18: 40 mg via ORAL
  Filled 2022-10-18: qty 1

## 2022-10-18 MED ORDER — GABAPENTIN 300 MG PO CAPS: ORAL_CAPSULE | ORAL | 3 refills | Status: DC

## 2022-10-18 MED ORDER — LEVOTHYROXINE SODIUM 50 MCG PO TABS: 50.0000 ug | ORAL_TABLET | Freq: Every day | ORAL | Status: DC

## 2022-10-18 MED ORDER — GABAPENTIN 300 MG PO CAPS: 600.0000 mg | ORAL_CAPSULE | Freq: Every morning | ORAL | Status: DC

## 2022-10-18 MED ORDER — GABAPENTIN 300 MG PO CAPS: 300.0000 mg | ORAL_CAPSULE | Freq: Every morning | ORAL | Status: DC

## 2022-10-18 MED ORDER — ASPIRIN 81 MG PO TBEC
81.0000 mg | DELAYED_RELEASE_TABLET | Freq: Every day | ORAL | Status: DC
  Administered 2022-10-18: 81 mg via ORAL
  Filled 2022-10-18: qty 1

## 2022-10-18 MED ORDER — GABAPENTIN 300 MG PO CAPS: 600.0000 mg | ORAL_CAPSULE | Freq: Every day | ORAL | Status: DC

## 2022-10-18 MED ORDER — ATORVASTATIN CALCIUM 80 MG PO TABS: 80.0000 mg | ORAL_TABLET | Freq: Every day | ORAL | 3 refills | Status: DC

## 2022-10-18 MED ORDER — DIVALPROEX SODIUM ER 500 MG PO TB24: 500.0000 mg | ORAL_TABLET | Freq: Every day | ORAL | 3 refills | Status: DC

## 2022-10-18 MED ORDER — GABAPENTIN 600 MG PO TABS: 600.0000 mg | ORAL_TABLET | ORAL | Status: DC

## 2022-10-18 MED ORDER — DIVALPROEX SODIUM ER 500 MG PO TB24
500.0000 mg | ORAL_TABLET | Freq: Every day | ORAL | Status: DC
  Administered 2022-10-18: 500 mg via ORAL
  Filled 2022-10-18: qty 1

## 2022-10-18 NOTE — Progress Notes (Addendum)
SLP Cancellation Note  Patient Details Name: Jillian Rodgers MRN: [id number] DOB: 09-Mar-1946   Cancelled treatment:       Reason Eval/Treat Not Completed: SLP screened, no needs identified, will sign off Pt's MRI was negative for acute changes and pt reported that her word retrieval difficulty has resolved and that her communication is back to baseline. Pt has passed the [facility] and RN denied the pt having any symptoms of dysphagia. SLP will sign off.   Jahmeek Shirk I. Vear Clock, MS, CCC-SLP Acute Rehabilitation Services Office number [phone]  Scheryl Marten 10/18/2022, 11:30 AM

## 2022-10-18 NOTE — Progress Notes (Signed)
OT Cancellation Note  Patient Details Name: Jillian Rodgers MRN: [id number] DOB: Dec 21, 1945   Cancelled Treatment:    Reason Eval/Treat Not Completed: OT screened, no needs identified, will sign off (MRI (-); discussed with PT. Pt at baseline.)  [name],[name] 10/18/2022, 11:05 AM [name], OT/L   Acute OT Clinical Specialist Acute Rehabilitation Services Pager [phone] Office [phone]

## 2022-10-18 NOTE — Evaluation (Signed)
Physical Therapy Evaluation Patient Details Name: Jillian Rodgers MRN: [id number] DOB: 03/10/1946 Today's Date: 10/18/2022  History of Present Illness  Patient is 77 y.o. female who presents with episode of aphasia, lightheadedness and slurred speech. This lasted 10-15 mins and spontaneouly resolved. Her neurologic examination is notable for no focal deficit. Description provided by patient is most concerning for aphasia possibly from a TIA. PMH significant for thrombocytopenia, GERD, HLD, HTN, pAfibb not on AC.   Clinical Impression  Jillian Rodgers is 77 y.o. female admitted with above HPI and diagnosis. Patient is currently limited by functional impairments below (see PT problem list). Patient lives with spouse and is independent at baseline. Overall pt mobilizing at min guard/supervision level with transfers and gait with no AD. She amb ~300' and VSS troughout. Pt completed 2 stairs to simulate home entrance and ascend/descend with alt pattern and 1 hand rail, guarding only for safety and IV management. Patient will benefit from continued skilled PT interventions to address impairments and progress independence with mobility. Acute PT will follow and progress as able.         Assistance Recommended at Discharge Intermittent Supervision/Assistance  If plan is discharge home, recommend the following:  Can travel by private vehicle  A little help with walking and/or transfers;Assistance with cooking/housework;Assist for transportation;Help with stairs or ramp for entrance        Equipment Recommendations None recommended by PT  Recommendations for Other Services       Functional Status Assessment Patient has had a recent decline in their functional status and demonstrates the ability to make significant improvements in function in a reasonable and predictable amount of time.     Precautions / Restrictions Precautions Precautions: Fall Restrictions Weight Bearing Restrictions: No       Mobility  Bed Mobility Overal bed mobility: Needs Assistance Bed Mobility: Supine to Sit     Supine to sit: Modified independent (Device/Increase time)     General bed mobility comments: pt moving to long sit and then pivoting to sit EOB.    Transfers Overall transfer level: Needs assistance Equipment used: None Transfers: Sit to/from Stand Sit to Stand: Min guard, Supervision           General transfer comment: guard/sup for safety, pt using UE's to power up and control lower    Ambulation/Gait Ambulation/Gait assistance: Min guard, Supervision Gait Distance (Feet): 300 Feet Assistive device: None Gait Pattern/deviations: Step-through pattern, Decreased stride length Gait velocity: decr     General Gait Details: pt with overall steady gait, occasional sway/drift to Rt, but pt with no buckling or LOB and able to correct to maintain straight line for gait. HR stable in 80's during ambulation  Stairs Stairs: Yes Stairs assistance: Min guard Stair Management: One rail Left, Alternating pattern, Forwards Number of Stairs: 2 General stair comments: guard for safety, pt using Lt UE on rail, no LOB with 180* turn at top to descend steps.  Wheelchair Mobility     Tilt Bed    Modified Rankin (Stroke Patients Only)       Balance Overall balance assessment: Needs assistance Sitting-balance support: Feet supported Sitting balance-Leahy Scale: Good     Standing balance support: During functional activity, No upper extremity supported Standing balance-Leahy Scale: Good Standing balance comment: pt amb with no AD, stood at sink for self care (oral hygeine and comb hair)  Pertinent Vitals/Pain Pain Assessment Pain Assessment: No/denies pain    Home Living Family/patient expects to be discharged to:: Private residence Living Arrangements: Spouse/significant other Available Help at Discharge: Family Type of Home:  House Home Access: Stairs to enter Entrance Stairs-Rails: None;Can reach both (no rail in back, 2 rails in front) Entrance Stairs-Number of Steps: 2 in back 6 in front   Home Layout: One level Home Equipment: None Additional Comments: likes to read    Prior Function Prior Level of Function : Independent/Modified Independent;Driving                     Hand Dominance   Dominant Hand: Right    Extremity/Trunk Assessment   Upper Extremity Assessment Upper Extremity Assessment: Overall WFL for tasks assessed (grossly 4-/5 throughout, AROM WFL; finger to nose and RAMs intact)    Lower Extremity Assessment Lower Extremity Assessment: Generalized weakness (grossly 4/5 thorughout, AROM WFL's, heel to shin intact, RAMs intact)    Cervical / Trunk Assessment Cervical / Trunk Assessment: Normal  Communication   Communication: No difficulties  Cognition Arousal/Alertness: Awake/alert Behavior During Therapy: WFL for tasks assessed/performed Overall Cognitive Status: Within Functional Limits for tasks assessed                                          General Comments      Exercises     Assessment/Plan    PT Assessment Patient needs continued PT services  PT Problem List Decreased activity tolerance;Decreased balance;Decreased strength;Decreased mobility;Decreased knowledge of use of DME       PT Treatment Interventions DME instruction;Gait training;Stair training;Functional mobility training;Therapeutic activities;Therapeutic exercise;Balance training;Neuromuscular re-education;Cognitive remediation;Patient/family education    PT Goals (Current goals can be found in the Care Plan section)  Acute Rehab PT Goals Patient Stated Goal: regain independence and get home PT Goal Formulation: With patient Time For Goal Achievement: 11/01/22 Potential to Achieve Goals: Good    Frequency Min 3X/week     Co-evaluation               AM-PAC PT "6  Clicks" Mobility  Outcome Measure Help needed turning from your back to your side while in a flat bed without using bedrails?: None Help needed moving from lying on your back to sitting on the side of a flat bed without using bedrails?: A Little Help needed moving to and from a bed to a chair (including a wheelchair)?: A Little Help needed standing up from a chair using your arms (e.g., wheelchair or bedside chair)?: A Little Help needed to walk in hospital room?: A Little Help needed climbing 3-5 steps with a railing? : A Little 6 Click Score: 19    End of Session Equipment Utilized During Treatment: Gait belt Activity Tolerance: Patient tolerated treatment well Patient left: in chair;with call bell/phone within reach;with chair alarm set Nurse Communication: Mobility status PT Visit Diagnosis: Difficulty in walking, not elsewhere classified (R26.2);Other abnormalities of gait and mobility (R26.89)    Time: 1610-9604 PT Time Calculation (min) (ACUTE ONLY): 39 min   Charges:   PT Evaluation $PT Eval Moderate Complexity: 1 Mod PT Treatments $Gait Training: 8-22 mins $Therapeutic Activity: 8-22 mins PT General Charges $$ ACUTE PT VISIT: 1 Visit         Wynn Maudlin, DPT Acute Rehabilitation Services Office [phone]  10/18/22 11:16 AM

## 2022-10-18 NOTE — Progress Notes (Signed)
  Echocardiogram 2D Echocardiogram has been performed.  Delcie Roch 10/18/2022, 12:35 PM

## 2022-10-18 NOTE — TOC Transition Note (Signed)
Transition of Care [facility]) - CM/SW Discharge Note   Patient Details  Name: Jillian Rodgers MRN: [id number] Date of Birth: 22-Apr-1945  [facility] CM/SW Contact:  Kermit Balo, RN Phone Number: 10/18/2022, 3:45 PM   Clinical Narrative:    Pt is discharging home with outpatient therapy through [facility]. Information on the AVS.  No DME.  Pt and spouse drive. He will provide transportation home today. He can also provide supervision at home. Pt manages her own medications and denies any issues.    Final next level of care: OP Rehab Barriers to Discharge: No Barriers Identified   Patient Goals and CMS Choice      Discharge Placement                         Discharge Plan and Services Additional resources added to the After Visit Summary for                                       Social Determinants of Health (SDOH) Interventions SDOH Screenings   Food Insecurity: No Food Insecurity (10/17/2022)  Housing: Patient Declined (10/17/2022)  Transportation Needs: No Transportation Needs (10/17/2022)  Utilities: Not At Risk (10/17/2022)  Depression (PHQ2-9): Low Risk  (09/26/2022)  Tobacco Use: Low Risk  (10/17/2022)     Readmission Risk Interventions     No data to display

## 2022-10-18 NOTE — Progress Notes (Addendum)
STROKE TEAM PROGRESS NOTE   BRIEF HPI Ms. Jillian Rodgers is a 77 y.o. female with history of thrombocytopenia, GERD, HLD, HTN, pAfibb not on AC who presents with episode of aphasia, lightheadedness and slurred speech. This lasted 10-15 mins. Reports she was with her grandson, was walking in to the den when this happened. She knew what was going on but could not get the words out and speech not making sense. Felt a little confused later in the episode that then resolved. One week ago she had an episode of unable to read while she was checking out from her doctors office. Reports that staff was trying to set her up for a follow up appointment and so she looked at her calendar but could not make sense of what she was looking at. She was able to talk fine during that. Reports 2 GTC seizures when she was pregnant. The seizure occurred early in the pregnancy and the rest of the pregnancy went fine. Not had a seizure since then.  No recent significant head injury or loss of consciousness.  Does not drink alcohol.  No history of meningitis, CNS tumor, ICH, stroke in the past.  No family history of epilepsy.  She endorses history of atrial fibrillation.  Reports that for some time, she was on Eliquis however was taken off of it after her cardiologist discussed this with her hematologist as she has chronic thrombocytopenia and platelet count this admission was 41,000. Endorses a history of migraines.    SIGNIFICANT HOSPITAL EVENTS 7/2- admit for TIA/seizure work up  INTERIM HISTORY/SUBJECTIVE EEG pending  May need to start ASA 81mg   Will titrate down gabapentin and starting depakote for migraine and seizure prophylaxis.   Originally on 600mg  in the morning and 1200mg  at night -> now on 300mg  in the morning and 600mg  at night Start depakote 500mg  ER for migraine and seizure prophylaxis  OBJECTIVE  CBC    Component Value Date/Time   WBC 5.3 10/18/2022 0443   RBC 4.41 10/18/2022 0443   HGB 13.1  10/18/2022 0443   HGB 12.8 02/14/2022 1146   HCT 39.5 10/18/2022 0443   PLT 41 (L) 10/18/2022 0443   PLT 72 (L) 02/14/2022 1146   MCV 89.6 10/18/2022 0443   MCH 29.7 10/18/2022 0443   MCHC 33.2 10/18/2022 0443   RDW 12.2 10/18/2022 0443   LYMPHSABS 1.3 10/17/2022 1129   MONOABS 0.6 10/17/2022 1129   EOSABS 0.1 10/17/2022 1129   BASOSABS 0.0 10/17/2022 1129    BMET    Component Value Date/Time   NA 138 10/18/2022 0443   K 3.8 10/18/2022 0443   CL 104 10/18/2022 0443   CO2 26 10/18/2022 0443   GLUCOSE 90 10/18/2022 0443   BUN 18 10/18/2022 0443   CREATININE 1.17 (H) 10/18/2022 0443   CREATININE 1.31 (H) 09/26/2022 1547   CALCIUM 9.8 10/18/2022 0443   EGFR 42 (L) 09/26/2022 1547   GFRNONAA 48 (L) 10/18/2022 0443   GFRNONAA 43 (L) 07/13/2022 1236   GFRNONAA 45 (L) 10/20/2020 1542    IMAGING past 24 hours MR BRAIN WO CONTRAST  Result Date: 10/18/2022 CLINICAL DATA:  Initial evaluation for neuro deficit, stroke suspected. EXAM: MRI HEAD WITHOUT CONTRAST TECHNIQUE: Multiplanar, multiecho pulse sequences of the brain and surrounding structures were obtained without intravenous contrast. COMPARISON:  Prior CT from earlier the same day. FINDINGS: Brain: Mild age-related cerebral atrophy. Patchy T2/FLAIR hyperintensity involving the periventricular deep white matter both cerebral hemispheres, consistent with chronic small vessel  ischemic disease, mild for age. Few small superimposed remote lacunar infarcts noted about the bilateral basal ganglia. No abnormal foci of restricted diffusion to suggest acute or subacute ischemia. Gray-white matter differentiation maintained. No areas of chronic cortical infarction. No acute or chronic intracranial blood products. No mass lesion, midline shift or mass effect. No hydrocephalus or extra-axial fluid collection. Pituitary gland and suprasellar region within normal limits. Vascular: Major intracranial vascular flow voids are maintained. Skull and upper  cervical spine: Craniocervical junction within normal limits. Bone marrow signal intensity normal. No scalp soft tissue abnormality. Sinuses/Orbits: Prior bilateral ocular lens replacement. Paranasal sinuses are largely clear. Trace left mastoid effusion noted, of doubtful significance. Other: None. IMPRESSION: 1. No acute intracranial abnormality. 2. Mild age-related cerebral atrophy with chronic small vessel ischemic disease. Electronically Signed   By: Rise Mu M.D.   On: 10/18/2022 04:51   CT ANGIO HEAD NECK W WO CM  Result Date: 10/17/2022 CLINICAL DATA:  Neuro deficit, acute, stroke suspected EXAM: CT ANGIOGRAPHY HEAD AND NECK WITH AND WITHOUT CONTRAST TECHNIQUE: Multidetector CT imaging of the head and neck was performed using the standard protocol during bolus administration of intravenous contrast. Multiplanar CT image reconstructions and MIPs were obtained to evaluate the vascular anatomy. Carotid stenosis measurements (when applicable) are obtained utilizing NASCET criteria, using the distal internal carotid diameter as the denominator. RADIATION DOSE REDUCTION: This exam was performed according to the departmental dose-optimization program which includes automated exposure control, adjustment of the mA and/or kV according to patient size and/or use of iterative reconstruction technique. CONTRAST:  60mL OMNIPAQUE IOHEXOL 350 MG/ML SOLN COMPARISON:  None Available. FINDINGS: CT HEAD FINDINGS Brain: Age indeterminate infarct in the left caudate head (series 7, image 17), and in the right parietal lobe (series 7, image 18) no evidence of hemorrhage, hydrocephalus, extra-axial collection or mass lesion/mass effect. Sequela of moderate chronic microvascular ischemic change with generalized volume loss. Vascular: See below Skull: Normal. Negative for fracture or focal lesion. Sinuses/Orbits: No middle ear or mastoid effusion. Paranasal sinuses are clear. Bilateral lens replacement. Orbits are  otherwise unremarkable. Other: None. Review of the MIP images confirms the above findings CTA NECK FINDINGS Aortic arch: Standard branching. Imaged portion shows no evidence of aneurysm or dissection. No significant stenosis of the major arch vessel origins. Right carotid system: No evidence of dissection, stenosis (50% or greater), or occlusion. Left carotid system: No evidence of dissection, stenosis (50% or greater), or occlusion. Vertebral arteries: Codominant. No evidence of dissection, stenosis (50% or greater), or occlusion. Skeleton: Negative. Other neck: Negative. Upper chest: Negative. Review of the MIP images confirms the above findings CTA HEAD FINDINGS Anterior circulation: No significant stenosis, proximal occlusion, aneurysm, or vascular malformation. Posterior circulation: No significant stenosis, proximal occlusion, aneurysm, or vascular malformation. Venous sinuses: As permitted by contrast timing, patent. Anatomic variants: None Review of the MIP images confirms the above findings IMPRESSION: 1. Age indeterminate infarcts in the left caudate head and right parietal lobe. If there is concern for acute infarct, consider MRI for further evaluation. 2. No intracranial large vessel occlusion or significant stenosis. 3. No hemodynamically significant stenosis in the neck. Electronically Signed   By: Lorenza Cambridge M.D.   On: 10/17/2022 12:57   DG Chest Portable 1 View  Result Date: 10/17/2022 CLINICAL DATA:  Weakness and lightheadedness, near syncope. Headache. EXAM: PORTABLE CHEST 1 VIEW COMPARISON:  08/30/2022 FINDINGS: Retrocardiac density compatible with moderate-sized hiatal hernia. Atherosclerotic aortic arch and descending thoracic aorta. Mild scarring or atelectasis along  the lingula. The lungs appear otherwise clear. No blunting of the costophrenic angles. No acute bony findings. Right upper quadrant clips compatible with prior cholecystectomy. IMPRESSION: 1. Moderate-sized hiatal hernia. 2.   Aortic Atherosclerosis (ICD10-I70.0). 3. Mild scarring or atelectasis along the lingula. Electronically Signed   By: Gaylyn Rong M.D.   On: 10/17/2022 12:19    Vitals:   10/18/22 0023 10/18/22 0433 10/18/22 0510 10/18/22 0732  BP: (!) 188/109 (!) 223/124 (!) 147/97 (!) 135/99  Pulse: 63 61 66 69  Resp: 17   18  Temp: 97.6 F (36.4 C) 98.1 F (36.7 C)  98.2 F (36.8 C)  TempSrc: Oral Oral  Oral  SpO2: 98% 100%  96%  Weight:      Height:         PHYSICAL EXAM General:  Alert, well-nourished, well-developed patient in no acute distress Psych:  Mood and affect appropriate for situation CV: Regular rate and rhythm on monitor Respiratory:  Regular, unlabored respirations on room air GI: Abdomen soft and nontender   NEURO:  Mental Status: AA&Ox3, patient is able to give clear and coherent history Speech/Language: speech is without dysarthria or aphasia.  Naming, repetition, fluency, and comprehension intact.  Cranial Nerves:  II: PERRL. Visual fields full.  III, IV, VI: EOMI. Eyelids elevate symmetrically.  V: Sensation is intact to light touch and symmetrical to face.  VII: Face is symmetrical resting and smiling VIII: hearing intact to voice. IX, X: Palate elevates symmetrically. Phonation is normal.  WG:[id number] shrug 5/5. XII: tongue is midline without fasciculations. Motor: 5/5 strength to all muscle groups tested.  Tone: is normal and bulk is normal Sensation- Intact to light touch bilaterally. Extinction absent to light touch to DSS.   Coordination: FTN intact bilaterally, HKS: no ataxia in BLE.No drift.  Gait- deferred   ASSESSMENT/PLAN  TIA vs seizure  Code Stroke CT head Age indeterminate infarcts in the left caudate head and right parietal lobe. CTA head & neck No LVO MRI  No acute abnormality Echo- EF 60-65% EEG- This study is within normal limits. No seizures or epileptiform discharges were seen throughout the recording.  LDL 76 HgbA1c 5.7 VTE  prophylaxis - SCDs No antithrombotic prior to admission, differ anticoagulant decision to hematology and cardiology. They are on board with her starting ASA 81mg  Therapy recommendations:  No follow up needed Disposition:  Home  Atrial fibrillation Home Meds: None Not on OAC due to thrombocytopenia  Hyperlipidemia LDL 76, goal < 70 Add Atorvastatin 80mg   Continue statin at discharge  Migraine history Currently on gabapentin 600mg  AC and 1200mg  HS Switch to depakote and titrate down gabapentin Depakote 500mg  EC and gabapentin 300mg  AC and 600mg  HS currently   Other Active Problems Hypothyroidism- on synthroid Seizure history Seizure while pregnant years ago EEG negative Thrombocytopenia Plt - 41  Hospital day # 0  Patient seen and examined by NP/APP with MD. MD to update note as needed.   Elmer Picker, DNP, FNP-BC [facility] Pager: [phone]  STROKE MD NOTE :  I have personally obtained history,examined this patient, reviewed notes, independently viewed imaging studies, participated in medical decision making and plan of care.ROS completed by me personally and pertinent positives fully documented  I have made any additions or clarifications directly to the above note. Agree with note above.  Patient presented with transient episode of confusion and aphasia speech difficulties along with some headache.  She has had somewhat similar episode in the past and has remote history of  seizures.  She has A-fib but is not an anticoagulation candidate due to chronic thrombocytopenia with platelet count 41,000 this admission.  Neurovascular brain imaging is unremarkable.  Recommend check EEG for seizure activity.  Change gabapentin to Depakote ER 500 mg daily for both migraine and seizure prophylaxis.  Reduce gabapentin to 300 mg in the morning and 600 mg at night for 2 weeks and then 300 mg twice daily for 2 weeks and then stop.  Patient is [place] long-term  anticoagulation candidate for her A-fib at this time due to risk for bleeding from thrombocytopenia.  Start aspirin 81 mg daily for stroke prevention and maintain aggressive risk factor modification.  Discussed with Dr. Isidoro Donning.  Greater than 50% time during this 50-minute visit were spent in counseling and coordination of care about TIA versus seizure discussion and risk-benefit of anticoagulation versus seizure medications and answering questions  Delia Heady, MD Medical Director [facility] Pager: [phone] 10/18/2022 3:30 PM   To contact Stroke Continuity provider, please refer to [email or web address]. After hours, contact General Neurology

## 2022-10-18 NOTE — Progress Notes (Signed)
EEG complete - results pending 

## 2022-10-18 NOTE — Consult Note (Addendum)
NEUROLOGY CONSULTATION NOTE   Date of service: October 18, 2022 Patient Name: Jillian Rodgers MRN:  [id number] DOB:  [birth year] Reason for consult: "aphasia" Requesting Provider: Darlin Drop, DO _ _ _   _ __   _ __ _ _  __ __   _ __   __ _  History of Present Illness  Jillian Rodgers is a 77 y.o. female with PMH significant for thrombocytopenia, GERD, HLD, HTN, pAfibb not on [facility] who presents with episode of aphasia, lightheadedness and slurred speech. This lasted 10-15 mins. Reports she was with her grandson, has just walked over to the kitchen when this happened. She knew what wsa going on but could not get the words out and speech not making sense. Felt a little confused later in the episode. It spontaneously resolved.  She had an episode of unable to read while she was checking out from her doctors office. Reports that staff was trying to set her up for a follow up appointment and so she looked at her calendar but could not make sense of what she was looking at. She was able to talk fine during that.  Reports 2 GTC seizures when she was pregnant. The seizure occurred early in the pregnancy and the rest of the pregnancy went fine. Not had a seizure since then.  No recent significant head injury or loss of consciousness.  Does not drink alcohol.  No history of meningitis, CNS tumor, ICH, stroke in the past.  No family history of epilepsy.  No prior hx of strokes but extensive family hx of strokes.  She endorses history of atrial fibrillation.  Reports that for some time, she was on Eliquis however was taken off of it after her cardiologist discussed this with her hematologist.  LKW: 10/15/22 mRS: 0 tNKASE: Not offered due to resolution of symptoms and outside the window and thrombocytopenia. Thrombectomy: Not offered due to resolution of symptoms. NIHSS components Score: Comment  1a Level of Conscious 0[x]  1[]  2[]  3[]      1b LOC Questions 0[x]  1[]  2[]       1c LOC Commands 0[x]  1[]  2[]        2 Best Gaze 0[x]  1[]  2[]       3 Visual 0[x]  1[]  2[]  3[]      4 Facial Palsy 0[x]  1[]  2[]  3[]      5a Motor Arm - left 0[x]  1[]  2[]  3[]  4[]  UN[]    5b Motor Arm - Right 0[x]  1[]  2[]  3[]  4[]  UN[]    6a Motor Leg - Left 0[x]  1[]  2[]  3[]  4[]  UN[]    6b Motor Leg - Right 0[x]  1[]  2[]  3[]  4[]  UN[]    7 Limb Ataxia 0[x]  1[]  2[]  3[]  UN[]     8 Sensory 0[x]  1[]  2[]  UN[]      9 Best Language 0[x]  1[]  2[]  3[]      10 Dysarthria 0[x]  1[]  2[]  UN[]      11 Extinct. and Inattention 0[x]  1[]  2[]       TOTAL: 0      ROS   Constitutional Denies weight loss, fever and chills.   HEENT Denies changes in vision and hearing.   Respiratory Denies SOB and cough.   CV Denies palpitations and CP   GI Denies abdominal pain, nausea, vomiting and diarrhea.   GU Denies dysuria and urinary frequency.   MSK Denies myalgia and joint pain.   Skin Denies rash and pruritus.   Neurological Denies headache and syncope.   Psychiatric Denies recent changes in mood. Denies anxiety  and depression.    Past History   Past Medical History:  Diagnosis Date   Anemia with low platelet count (HCC)    Chronic kidney disease    Degenerative joint disease    Dysrhythmia    a fib   GERD (gastroesophageal reflux disease)    History of kidney stones    Hyperlipidemia    Hypothyroidism    Inguinal hernia 02/19/2019   Labile hypertension    Migraines    neurontin helps   Pneumonia    hx of walking pneumonia    Primary hypertension 04/28/2022   Past Surgical History:  Procedure Laterality Date   BILATERAL CATARACT SURGERY      BLERPHOROPLASTY \     CHOLECYSTECTOMY  1996   open   COLONOSCOPY  2009   recommended 10 year f/u   HERNIA REPAIR  1985   umb hernia rpr   HERNIA REPAIR  2005 & 2009   ventral hernia rpr   INGUINAL HERNIA REPAIR Left 04/30/2020   Procedure: OPEN LEFT INGUINAL HERNIA REPAIR WITH MESH;  Surgeon: Berna Bue, MD;  Location: [facility];  Service: General;  Laterality: Left;   INGUINAL HERNIA REPAIR  Right 10/24/2021   Procedure: OPEN RIGHT INGUINAL HERNIA REPAIR WITH MESH ;  Surgeon: Berna Bue, MD;  Location: [facility];  Service: General;  Laterality: Right;   PILONIDAL CYST EXCISION  1972   SKIN CANCER REMOVAL      TONSILLECTOMY     UPPER GI ENDOSCOPY     VENTRAL HERNIA REPAIR     Family History  Problem Relation Age of Onset   Ovarian cancer Mother    Heart disease Mother    Cancer Mother    Uterine cancer Mother    Hypertension Mother    Breast cancer Sister    Stroke Paternal Uncle    Congestive Heart Failure Father    Gout Maternal Aunt    Colon cancer Neg Hx    Esophageal cancer Neg Hx    Stomach cancer Neg Hx    Rectal cancer Neg Hx    Social History   Socioeconomic History   Marital status: Married    Spouse name: Not on file   Number of children: 2   Years of education: 18   Highest education level: Not on file  Occupational History   Not on file  Tobacco Use   Smoking status: Never   Smokeless tobacco: Never  Vaping Use   Vaping Use: Never used  Substance and Sexual Activity   Alcohol use: No   Drug use: No   Sexual activity: Yes  Other Topics Concern   Not on file  Social History Narrative   Not on file   Social Determinants of Health   Financial Resource Strain: Not on file  Food Insecurity: No Food Insecurity (10/17/2022)   Hunger Vital Sign    Worried About Running Out of Food in the Last Year: Never true    Ran Out of Food in the Last Year: Never true  Transportation Needs: No Transportation Needs (10/17/2022)   PRAPARE - Administrator, Civil Service (Medical): No    Lack of Transportation (Non-Medical): No  Physical Activity: Not on file  Stress: Not on file  Social Connections: Not on file   No Known Allergies  Medications   Medications Prior to Admission  Medication Sig Dispense Refill Last Dose   acetaminophen (TYLENOL) 325 MG tablet Take 650 mg by mouth every  6 (six) hours as needed for moderate pain or  headache.   10/17/2022   Ascorbic Acid (VITAMIN C) 500 MG CAPS Take 500 mg by mouth daily.    10/17/2022   BOTOX 100 units SOLR injection Inject 100 Units into the muscle See admin instructions. Inject 100 units intramuscularly every 6-8 weeks   Past Week   cetirizine (ZYRTEC) 10 MG tablet Take 10 mg by mouth daily.   10/16/2022   Cholecalciferol (VITAMIN D) 125 MCG (5000 UT) CAPS Take 5,000 Units by mouth daily.   10/17/2022   gabapentin (NEURONTIN) 600 MG tablet Take 600 mg by mouth See admin instructions. Take 600 mg by mouth in morning and 1200 mg at bedtime   10/17/2022   levothyroxine (SYNTHROID) 50 MCG tablet TAKE 1 TABLET BY MOUTH DAILY ON A EMPTY STOMACH WITH WATER. DO NOT TAKE ANY ANTACID, CALCIUM OR MAGNESIUM FOR 4 HOURS. AVOID BIOTIN 90 tablet 3 10/17/2022   pantoprazole (PROTONIX) 40 MG tablet Take  1 tablet  2 x /day  for Acid Indigestion & Heartburn 180 tablet 3 10/17/2022   phentermine (ADIPEX-P) 37.5 MG tablet Take 1/2 to 1 tablet every Morning for Dieting & Weight Loss 90 tablet 1 10/17/2022   Polyethyl Glycol-Propyl Glycol (SYSTANE ULTRA) 0.4-0.3 % SOLN Place 1 drop into both eyes 3 (three) times daily as needed (dry eyes).   10/17/2022   pravastatin (PRAVACHOL) 40 MG tablet TAKE 1 TABLET BY MOUTH AT BEDTIME FOR CHOLESTEROL 90 tablet 3 10/16/2022   Probiotic Product (PROBIOTIC PO) Take 1 tablet by mouth daily.   10/17/2022   Propylene Glycol (SYSTANE BALANCE) 0.6 % SOLN Place 1 drop into both eyes at bedtime.   10/16/2022   zinc gluconate 50 MG tablet Take 50 mg by mouth daily.   10/17/2022   meloxicam (MOBIC) 15 MG tablet Take 1/2 to 1 tablet Daily for Pain & Inflammation 90 tablet 3    topiramate (TOPAMAX) 50 MG tablet Take 1/2 to 1 tablet 2 x /day at Suppertime & Bedtime for Dieting & Weight Loss 180 tablet 1      Vitals   Vitals:   10/17/22 1700 10/17/22 1746 10/17/22 2109 10/18/22 0023  BP: (!) 188/123 (!) 186/110 (!) 193/107 (!) 188/109  Pulse: 67 70 67 63  Resp: 13  16 17   Temp:  98.3 F  (36.8 C) 97.8 F (36.6 C) 97.6 F (36.4 C)  TempSrc:  Oral Oral Oral  SpO2: 100% 99% 100% 98%  Weight:      Height:         Body mass index is 26.64 kg/m.  Physical Exam   General: Laying comfortably in bed; in no acute distress.  HENT: Normal oropharynx and mucosa. Normal external appearance of ears and nose.  Neck: Supple, no pain or tenderness  CV: No JVD. No peripheral edema.  Pulmonary: Symmetric Chest rise. Normal respiratory effort.  Abdomen: Soft to touch, non-tender.  Ext: No cyanosis, edema, or deformity  Skin: No rash. Normal palpation of skin.   Musculoskeletal: Normal digits and nails by inspection. No clubbing.   Neurologic Examination  Mental status/Cognition: Alert, oriented to self, place, month and year, good attention.  Speech/language: Fluent, comprehension intact, object naming intact, repetition intact.  Cranial nerves:   CN II Pupils equal and reactive to light, no VF deficits    CN III,IV,VI EOM intact, no gaze preference or deviation, no nystagmus    CN V normal sensation in V1, V2, and V3 segments bilaterally  CN VII no asymmetry, no nasolabial fold flattening    CN VIII normal hearing to speech    CN IX & X normal palatal elevation, no uvular deviation    CN XI 5/5 head turn and 5/5 shoulder shrug bilaterally    CN XII midline tongue protrusion    Motor:  Muscle bulk: normal, tone normal, pronator drift none tremor none Mvmt Root Nerve  Muscle Right Left Comments  SA C5/6 Ax Deltoid 5 5   EF C5/6 Mc Biceps 5 5   EE C6/7/8 Rad Triceps 5 5   WF C6/7 Med FCR     WE C7/8 PIN ECU     F Ab C8/T1 U ADM/FDI 5 5   HF L1/2/3 Fem Illopsoas 5 5   KE L2/3/4 Fem Quad 5 5   DF L4/5 D Peron Tib Ant 5 5   PF S1/2 Tibial Grc/Sol 5 5    Sensation:  Light touch Intact throughout   Pin prick    Temperature    Vibration   Proprioception    Coordination/Complex Motor:  - Finger to Nose intact bilaterally - Heel to shin intact bilaterally - Rapid  alternating movement are normal - Gait: deferred for patient safety.  Labs   CBC:  Recent Labs  Lab 10/17/22 1129  WBC 6.3  NEUTROABS 4.2  HGB 14.1  HCT 42.5  MCV 90.6  PLT 48*    Basic Metabolic Panel:  Lab Results  Component Value Date   NA 139 10/17/2022   K 4.4 10/17/2022   CO2 26 10/17/2022   GLUCOSE 129 (H) 10/17/2022   BUN 23 10/17/2022   CREATININE 1.27 (H) 10/17/2022   CALCIUM 10.1 10/17/2022   GFRNONAA 43 (L) 10/17/2022   GFRAA 52 (L) 10/20/2020   Lipid Panel:  Lab Results  Component Value Date   LDLCALC 76 09/26/2022   HgbA1c:  Lab Results  Component Value Date   HGBA1C 5.7 (H) 09/26/2022   Urine Drug Screen: No results found for: "LABOPIA", "COCAINSCRNUR", "LABBENZ", "AMPHETMU", "THCU", "LABBARB"  Alcohol Level No results found for: "ETH"  CT angio Head and Neck with contrast(Personally reviewed): No LVO  MRI Brain(Personally reviewed): No acute stroke  rEEG:  pending  Impression   Jillian Rodgers is a 77 y.o. female with PMH significant for thrombocytopenia, GERD, HLD, HTN, pAfibb not on AC who presents with episode of aphasia, lightheadedness and slurred speech. This lasted 10-15 mins and spontaneouly resolved. Her neurologic examination is notable for no focal deficit. Description provided by patient is most concerning for aphasia possibly from a TIA. She has a hx of afibb but is not on AC 2/2 thrombocytopenia.  She reports extensive discussion between hematology and cardiology team outpatient and was taken off eliquis. She was on aspirin prior to being on eliquis but this was not continued when she was taken off eliquis. Given concern that the current episode could have been a TIA, would recommend discussing with hematology about starting aspirin.  Recommendations  - Frequent Neuro checks per stroke unit protocol - Recommend brain imaging with MRI Brain without contrast - Recommend Vascular imaging with MRA Angio Head without contrast and  US Carotid doppler - Recommend obtaining TTE - Recommend obtaining Lipid panel with LDL - Please start statin if LDL > 70 - Recommend HbA1c to evaluate for diabetes and how well it is controlled. - Antithrombotic - consider starting aspirin if okay with hematology to offer some protection against afibb. - Recommend DVT ppx - SBP  goal - permissive hypertension first 24 h < 220/110. Held home meds.  - Recommend Telemetry monitoring for arrythmia - Recommend bedside swallow screen prior to PO intake. - Stroke education booklet - Recommend PT/OT/SLP consult - routine EEG.   ______________________________________________________________________   Thank you for the opportunity to take part in the care of this patient. If you have any further questions, please contact the neurology consultation attending.  Signed,  Erick Blinks [facility] _ _ _   _ __   _ __ _ _  __ __   _ __   __ _

## 2022-10-18 NOTE — Discharge Summary (Signed)
Physician Discharge Summary   Patient: Jillian Rodgers MRN: [id number] DOB: May 31, 1945  Admit date:     10/17/2022  Discharge date: 10/18/22  Discharge Physician: Thad Ranger, MD    PCP: [name], MD   Recommendations at discharge:   Started on aspirin 81 mg daily Started on Lipitor 80 mg daily Decrease Neurontin to 300 mg in the morning and 600 mg at bedtime Added Depakote 500 mg daily  Discharge Diagnoses:    TIA (transient ischemic attack)   Hyperlipidemia   Hypothyroidism   CKD (chronic kidney disease) stage 3b, GFR 30-59 ml/min (HCC)   Thrombocytopenia (HCC)   IDA (iron deficiency anemia)   Paroxysmal atrial fibrillation (HCC)   Primary hypertension    Hospital Course:  Jillian Rodgers is a 77 y.o. 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  This happened around 9:30 AM.  Per the patient it lasted about 10 to 15 minutes.  Associated with slurred speech, lightheadedness, and headache.  Reportedly a couple of weeks ago, the patient had an episode of confusion while at the dentist office.  She was having difficulty understanding speech.   In the ED, her symptoms had resolved.  Due to concern for possible TIA or CVA, the patient had a CT angio head and neck which showed age-indeterminate infarcts in the left caudate head and right parietal lobe, recommended MRI for further evaluation. Patient was admitted for further workup.   Assessment and Plan:  TIA (transient ischemic attack) -Presented with slurred speech and inability to get the words out, headache and lightheadedness.  Patient had similar episode couple of weeks ago. -CTA head and neck showed age-indeterminate infarcts in the left caudate  head and right parietal lobe -Neurology consulted, MRI showed no acute intracranial abnormality, mild age-related cerebral atrophy with chronic small vessel ischemic disease -2D echo showed EF of 60 to 65%, G1 DD, saline contrast bubble study negative, no evidence of interatrial shunt -EEG showed no seizures or epileptiform discharges -PT OT ST evaluation, at baseline -Recent lipid panel on 09/26/2022 showed LDL 76, HDL 79, cholesterol 178 -Recent hemoglobin A1c on 09/24/2022 5.7 -Not on aspirin or Plavix due to thrombocytopenia.  Consulted patient's hematologist, Dr Al Pimple, recommended to weigh risk/benefit and okay to reattempt if needed by neurology, -Patient was seen by the stroke service, Dr. Pearlean Brownie, recommended aspirin 81 mg daily, Lipitor 80 mg daily   History of migraines -Per neurology recommendations, started on Depakote 500 mg daily and gabapentin decreased to 300 mg in the morning and 600 mg at bedtime.    Hyperlipidemia -Continue Lipitor     Hypothyroidism -Resume Synthroid 50 mcg daily     CKD (chronic kidney disease) stage 3b, GFR 30-59 ml/min (HCC) -Creatinine 1.1, at baseline 1.1-1.3     Chronic thrombocytopenia (HCC) -Patient has been following hematology outpatient, Dr Al Pimple, had appointment today. -Per hematology, likely etiology is immune mediated versus early bone marrow disorder. - f/u PLT count     IDA (iron deficiency anemia) -H&H stable     Paroxysmal atrial fibrillation (HCC) - last DCCV 2022. AC stopped by cardiology in the setting of thrombocytopenia     Primary hypertension - BP stable    Estimated body mass index is 26.64 kg/m as calculated from the following:   Height as of  this encounter: 5' 2.5" (1.588 m).   Weight as of this encounter: 67.1 kg.       Pain control - [employer] Controlled Substance Reporting System database was reviewed. and patient was instructed, not to drive, operate heavy machinery, perform activities at heights,  swimming or participation in water activities or provide baby-sitting services while on Pain, Sleep and Anxiety Medications; until their outpatient Physician has advised to do so again. Also recommended to not to take more than prescribed Pain, Sleep and Anxiety Medications.  Consultants: Neurology Procedures performed: 2D echo Disposition: Home Diet recommendation:  Discharge Diet Orders (From admission, onward)     Start     Ordered   10/18/22 0000  Diet - low sodium heart healthy        10/18/22 1539            DISCHARGE MEDICATION: Allergies as of 10/18/2022   No Known Allergies      Medication List     STOP taking these medications    gabapentin 600 MG tablet Commonly known as: NEURONTIN Replaced by: gabapentin 300 MG capsule   meloxicam 15 MG tablet Commonly known as: MOBIC   phentermine 37.5 MG tablet Commonly known as: ADIPEX-P   pravastatin 40 MG tablet Commonly known as: PRAVACHOL   topiramate 50 MG tablet Commonly known as: Topamax       TAKE these medications    acetaminophen 325 MG tablet Commonly known as: TYLENOL Take 650 mg by mouth every 6 (six) hours as needed for moderate pain or headache.   aspirin EC 81 MG tablet Take 1 tablet (81 mg total) by mouth daily. Swallow whole. Start taking on: October 19, 2022   atorvastatin 80 MG tablet Commonly known as: LIPITOR Take 1 tablet (80 mg total) by mouth daily. Start taking on: October 19, 2022   Botox 100 units Solr injection Generic drug: botulinum toxin Type A Inject 100 Units into the muscle See admin instructions. Inject 100 units intramuscularly every 6-8 weeks   cetirizine 10 MG tablet Commonly known as: ZYRTEC Take 10 mg by mouth daily.   divalproex 500 MG 24 hr tablet Commonly known as: DEPAKOTE ER Take 1 tablet (500 mg total) by mouth daily.   gabapentin 300 MG capsule Commonly known as: NEURONTIN Take 300 mg (1 tablet) in the morning and 600 mg (2 tablets) at bedtime Replaces:  gabapentin 600 MG tablet   levothyroxine 50 MCG tablet Commonly known as: SYNTHROID TAKE 1 TABLET BY MOUTH DAILY ON A EMPTY STOMACH WITH WATER. DO NOT TAKE ANY ANTACID, CALCIUM OR MAGNESIUM FOR 4 HOURS. AVOID BIOTIN   pantoprazole 40 MG tablet Commonly known as: PROTONIX Take  1 tablet  2 x /day  for Acid Indigestion & Heartburn   PROBIOTIC PO Take 1 tablet by mouth daily.   Systane Balance 0.6 % Soln Generic drug: Propylene Glycol Place 1 drop into both eyes at bedtime.   Systane Ultra 0.4-0.3 % Soln Generic drug: Polyethyl Glycol-Propyl Glycol Place 1 drop into both eyes 3 (three) times daily as needed (dry eyes).   Vitamin C 500 MG Caps Take 500 mg by mouth daily.   Vitamin D 125 MCG (5000 UT) Caps Take 5,000 Units by mouth daily.   zinc gluconate 50 MG tablet Take 50 mg by mouth daily.        Follow-up Information     [name], MD. Schedule an appointment as soon as possible for a visit in 2 week(s).  Specialty: Internal Medicine Why: for hospital follow-up Contact information: [address] [place] [address] [phone]         Micki Riley, MD. Schedule an appointment as soon as possible for a visit in 4 week(s).   Specialties: Neurology, Radiology Why: for hospital follow-up Contact information: [address] [place] [address] [phone]                Discharge Exam: Ceasar Mons Weights   10/17/22 1058  Weight: 67.1 kg   S: No acute complaints, no headache, dizziness, lightheadedness.  No aphasia or dysarthria.  BP (!) 143/95   Pulse 74   Temp 98.3 F (36.8 C) (Oral)   Resp 18   Ht 5' 2.5" (1.588 m)   Wt 67.1 kg   SpO2 99%   BMI 26.64 kg/m   Physical Exam General: Alert and oriented x 3, NAD Cardiovascular: S1 S2 clear, RRR.  Respiratory: CTAB, no wheezing, rales or rhonchi Gastrointestinal: Soft, nontender, nondistended, NBS Ext: no pedal edema bilaterally Neuro: no new  deficits Skin: No rashes Psych: Normal affect and demeanor, alert and oriented x3    Condition at discharge: fair  The results of significant diagnostics from this hospitalization (including imaging, microbiology, ancillary and laboratory) are listed below for reference.   Imaging Studies: ECHOCARDIOGRAM COMPLETE BUBBLE STUDY  Result Date: 10/18/2022    ECHOCARDIOGRAM REPORT   Patient Name:   KENZA LEMOS Date of Exam: 10/18/2022 Medical Rec #:  [id number]         Height:       62.5 in Accession #:    [id number]        Weight:       148.0 lb Date of Birth:  Jul 07, 1945         BSA:          1.692 m Patient Age:    29 years          BP:           143/95 mmHg Patient Gender: F                 HR:           68 bpm. Exam Location:  Inpatient Procedure: 2D Echo, Color Doppler, Cardiac Doppler and Saline Contrast Bubble            Study Indications:    stroke  History:        Patient has prior history of Echocardiogram examinations, most                 recent 12/14/2020. Chronic kidney disease; Risk                 Factors:Hypertension and Dyslipidemia.  Sonographer:    Delcie Roch RDCS Referring Phys: [id number] CAROLE N HALL IMPRESSIONS  1. Left ventricular ejection fraction, by estimation, is 60 to 65%. The left ventricle has normal function. The left ventricle has no regional wall motion abnormalities. There is mild left ventricular hypertrophy. Left ventricular diastolic parameters are consistent with Grade I diastolic dysfunction (impaired relaxation).  2. Right ventricular systolic function is normal. The right ventricular size is normal. There is normal pulmonary artery systolic pressure. The estimated right ventricular systolic pressure is 24.7 mmHg.  3. The mitral valve is normal in structure. Trivial mitral valve regurgitation.  4. The aortic valve is tricuspid. Aortic valve regurgitation is not visualized. Aortic valve sclerosis is present, with no evidence of  aortic valve stenosis.  5. The  inferior vena cava is normal in size with greater than 50% respiratory variability, suggesting right atrial pressure of 3 mmHg.  6. Agitated saline contrast bubble study was negative, with no evidence of any interatrial shunt. FINDINGS  Left Ventricle: Left ventricular ejection fraction, by estimation, is 60 to 65%. The left ventricle has normal function. The left ventricle has no regional wall motion abnormalities. The left ventricular internal cavity size was small. There is mild left ventricular hypertrophy. Left ventricular diastolic parameters are consistent with Grade I diastolic dysfunction (impaired relaxation). Right Ventricle: The right ventricular size is normal. No increase in right ventricular wall thickness. Right ventricular systolic function is normal. There is normal pulmonary artery systolic pressure. The tricuspid regurgitant velocity is 2.33 m/s, and  with an assumed right atrial pressure of 3 mmHg, the estimated right ventricular systolic pressure is 24.7 mmHg. Left Atrium: Left atrial size was normal in size. Right Atrium: Right atrial size was normal in size. Pericardium: There is no evidence of pericardial effusion. Mitral Valve: The mitral valve is normal in structure. Trivial mitral valve regurgitation. Tricuspid Valve: The tricuspid valve is normal in structure. Tricuspid valve regurgitation is trivial. Aortic Valve: The aortic valve is tricuspid. Aortic valve regurgitation is not visualized. Aortic valve sclerosis is present, with no evidence of aortic valve stenosis. Pulmonic Valve: The pulmonic valve was not well visualized. Pulmonic valve regurgitation is trivial. Aorta: The aortic root and ascending aorta are structurally normal, with no evidence of dilitation. Venous: The inferior vena cava is normal in size with greater than 50% respiratory variability, suggesting right atrial pressure of 3 mmHg. IAS/Shunts: The interatrial septum was not well visualized. Agitated saline contrast  was given intravenously to evaluate for intracardiac shunting. Agitated saline contrast bubble study was negative, with no evidence of any interatrial shunt.  LEFT VENTRICLE PLAX 2D LVIDd:         3.50 cm   Diastology LVIDs:         2.30 cm   LV e' medial:    5.66 cm/s LV PW:         1.00 cm   LV E/e' medial:  11.6 LV IVS:        1.10 cm   LV e' lateral:   9.68 cm/s LVOT diam:     1.80 cm   LV E/e' lateral: 6.8 LV SV:         54 LV SV Index:   32 LVOT Area:     2.54 cm  RIGHT VENTRICLE             IVC RV Basal diam:  2.80 cm     IVC diam: 2.10 cm RV S prime:     12.50 cm/s TAPSE (M-mode): 2.3 cm LEFT ATRIUM             Index        RIGHT ATRIUM           Index LA diam:        3.30 cm 1.95 cm/m   RA Area:     13.40 cm LA Vol (A2C):   53.7 ml 31.74 ml/m  RA Volume:   31.20 ml  18.44 ml/m LA Vol (A4C):   46.2 ml 27.30 ml/m LA Biplane Vol: 50.2 ml 29.67 ml/m  AORTIC VALVE LVOT Vmax:   107.00 cm/s LVOT Vmean:  68.300 cm/s LVOT VTI:    0.213 m  AORTA Ao Root diam: 2.90 cm Ao  Asc diam:  3.60 cm MITRAL VALVE               TRICUSPID VALVE MV Area (PHT): 3.53 cm    TR Peak grad:   21.7 mmHg MV Decel Time: 215 msec    TR Vmax:        233.00 cm/s MV E velocity: 65.60 cm/s MV A velocity: 82.50 cm/s  SHUNTS MV E/A ratio:  0.80        Systemic VTI:  0.21 m                            Systemic Diam: 1.80 cm Epifanio Lesches MD Electronically signed by Epifanio Lesches MD Signature Date/Time: 10/18/2022/1:24:17 PM    Final    EEG adult  Result Date: 10/18/2022 Charlsie Quest, MD     10/18/2022 12:18 PM Patient Name: KENDIS DASHER MRN: [id number] Epilepsy Attending: Charlsie Quest Referring Physician/Provider: Erick Blinks, MD Date: 10/18/2022 Duration: 23.55 mins Patient history: 77 y.o. female with PMH significant for thrombocytopenia, GERD, HLD, HTN, pAfibb not on [facility] who presents with episode of aphasia, lightheadedness and slurred speech. EEG to evaluate for seizure. Level of alertness: Awake, asleep  AEDs during EEG study: None Technical aspects: This EEG study was done with scalp electrodes positioned according to the 10-20 International system of electrode placement. Electrical activity was reviewed with band pass filter of 1-70Hz , sensitivity of 7 uV/mm, display speed of 72mm/sec with a 60Hz  notched filter applied as appropriate. EEG data were recorded continuously and digitally stored.  Video monitoring was available and reviewed as appropriate. Description: The posterior dominant rhythm consists of 9 Hz activity of moderate voltage (25-35 uV) seen predominantly in posterior head regions, symmetric and reactive to eye opening and eye closing. Sleep was characterized by vertex waves, sleep spindles (12 to 14 Hz), maximal frontocentral region. Hyperventilation and photic stimulation were not performed.   IMPRESSION: This study is within normal limits. No seizures or epileptiform discharges were seen throughout the recording. A normal interictal EEG does not exclude the diagnosis of epilepsy. Charlsie Quest   MR BRAIN WO CONTRAST  Result Date: 10/18/2022 CLINICAL DATA:  Initial evaluation for neuro deficit, stroke suspected. EXAM: MRI HEAD WITHOUT CONTRAST TECHNIQUE: Multiplanar, multiecho pulse sequences of the brain and surrounding structures were obtained without intravenous contrast. COMPARISON:  Prior CT from earlier the same day. FINDINGS: Brain: Mild age-related cerebral atrophy. Patchy T2/FLAIR hyperintensity involving the periventricular deep white matter both cerebral hemispheres, consistent with chronic small vessel ischemic disease, mild for age. Few small superimposed remote lacunar infarcts noted about the bilateral basal ganglia. No abnormal foci of restricted diffusion to suggest acute or subacute ischemia. Gray-white matter differentiation maintained. No areas of chronic cortical infarction. No acute or chronic intracranial blood products. No mass lesion, midline shift or mass effect. No  hydrocephalus or extra-axial fluid collection. Pituitary gland and suprasellar region within normal limits. Vascular: Major intracranial vascular flow voids are maintained. Skull and upper cervical spine: Craniocervical junction within normal limits. Bone marrow signal intensity normal. No scalp soft tissue abnormality. Sinuses/Orbits: Prior bilateral ocular lens replacement. Paranasal sinuses are largely clear. Trace left mastoid effusion noted, of doubtful significance. Other: None. IMPRESSION: 1. No acute intracranial abnormality. 2. Mild age-related cerebral atrophy with chronic small vessel ischemic disease. Electronically Signed   By: Rise Mu M.D.   On: 10/18/2022 04:51   CT ANGIO HEAD NECK W WO CM  Result  Date: 10/17/2022 CLINICAL DATA:  Neuro deficit, acute, stroke suspected EXAM: CT ANGIOGRAPHY HEAD AND NECK WITH AND WITHOUT CONTRAST TECHNIQUE: Multidetector CT imaging of the head and neck was performed using the standard protocol during bolus administration of intravenous contrast. Multiplanar CT image reconstructions and MIPs were obtained to evaluate the vascular anatomy. Carotid stenosis measurements (when applicable) are obtained utilizing NASCET criteria, using the distal internal carotid diameter as the denominator. RADIATION DOSE REDUCTION: This exam was performed according to the departmental dose-optimization program which includes automated exposure control, adjustment of the mA and/or kV according to patient size and/or use of iterative reconstruction technique. CONTRAST:  60mL OMNIPAQUE IOHEXOL 350 MG/ML SOLN COMPARISON:  None Available. FINDINGS: CT HEAD FINDINGS Brain: Age indeterminate infarct in the left caudate head (series 7, image 72), and in the right parietal lobe (series 7, image 18) no evidence of hemorrhage, hydrocephalus, extra-axial collection or mass lesion/mass effect. Sequela of moderate chronic microvascular ischemic change with generalized volume loss.  Vascular: See below Skull: Normal. Negative for fracture or focal lesion. Sinuses/Orbits: No middle ear or mastoid effusion. Paranasal sinuses are clear. Bilateral lens replacement. Orbits are otherwise unremarkable. Other: None. Review of the MIP images confirms the above findings CTA NECK FINDINGS Aortic arch: Standard branching. Imaged portion shows no evidence of aneurysm or dissection. No significant stenosis of the major arch vessel origins. Right carotid system: No evidence of dissection, stenosis (50% or greater), or occlusion. Left carotid system: No evidence of dissection, stenosis (50% or greater), or occlusion. Vertebral arteries: Codominant. No evidence of dissection, stenosis (50% or greater), or occlusion. Skeleton: Negative. Other neck: Negative. Upper chest: Negative. Review of the MIP images confirms the above findings CTA HEAD FINDINGS Anterior circulation: No significant stenosis, proximal occlusion, aneurysm, or vascular malformation. Posterior circulation: No significant stenosis, proximal occlusion, aneurysm, or vascular malformation. Venous sinuses: As permitted by contrast timing, patent. Anatomic variants: None Review of the MIP images confirms the above findings IMPRESSION: 1. Age indeterminate infarcts in the left caudate head and right parietal lobe. If there is concern for acute infarct, consider MRI for further evaluation. 2. No intracranial large vessel occlusion or significant stenosis. 3. No hemodynamically significant stenosis in the neck. Electronically Signed   By: Lorenza Cambridge M.D.   On: 10/17/2022 12:57   DG Chest Portable 1 View  Result Date: 10/17/2022 CLINICAL DATA:  Weakness and lightheadedness, near syncope. Headache. EXAM: PORTABLE CHEST 1 VIEW COMPARISON:  08/30/2022 FINDINGS: Retrocardiac density compatible with moderate-sized hiatal hernia. Atherosclerotic aortic arch and descending thoracic aorta. Mild scarring or atelectasis along the lingula. The lungs appear  otherwise clear. No blunting of the costophrenic angles. No acute bony findings. Right upper quadrant clips compatible with prior cholecystectomy. IMPRESSION: 1. Moderate-sized hiatal hernia. 2.  Aortic Atherosclerosis (ICD10-I70.0). 3. Mild scarring or atelectasis along the lingula. Electronically Signed   By: Gaylyn Rong M.D.   On: 10/17/2022 12:19    Microbiology: Results for orders placed or performed in visit on 09/28/22  Urine Culture     Status: None   Collection Time: 10/02/22 10:45 AM   Specimen: Urine  Result Value Ref Range Status   MICRO NUMBER: [id number]  Final   SPECIMEN QUALITY: Adequate  Final   Sample Source URINE  Final   STATUS: FINAL  Final   Result: No Growth  Final    Labs: CBC: Recent Labs  Lab 10/17/22 1129 10/18/22 0443  WBC 6.3 5.3  NEUTROABS 4.2  --   HGB 14.1 13.1  HCT 42.5 39.5  MCV 90.6 89.6  PLT 48* 41*   Basic Metabolic Panel: Recent Labs  Lab 10/17/22 1129 10/17/22 2100 10/18/22 0443  NA 136 139 138  K 4.4 4.4 3.8  CL 101 104 104  CO2 25 26 26   GLUCOSE 88 129* 90  BUN 30* 23 18  CREATININE 1.40* 1.27* 1.17*  CALCIUM 11.2* 10.1 9.8  MG  --   --  2.3   Liver Function Tests: Recent Labs  Lab 10/17/22 1129  AST 18  ALT 13  ALKPHOS 69  BILITOT 0.8  PROT 7.6  ALBUMIN 4.9   CBG: Recent Labs  Lab 10/17/22 1055  GLUCAP 80    Discharge time spent: greater than 30 minutes.  Signed: Thad Ranger, MD [facility] 10/18/2022

## 2022-10-18 NOTE — Progress Notes (Signed)
[facility]                                                                              Everlie Voelz, is a 77 y.o. female, DOB - [birth year], ZOX:[id number] Admit date - 10/17/2022    Outpatient Primary MD for the patient is [name], MD  LOS - 0  days  Chief Complaint  Patient presents with   Aphasia       Brief summary   Jillian Rodgers is a 77 y.o. 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  This happened around 9:30 AM.  Per the patient it lasted about 10 to 15 minutes.  Associated with slurred speech, lightheadedness, and headache.  Reportedly a couple of weeks ago, the patient had an episode of confusion while at the dentist office.  She was having difficulty understanding speech.   In the ED, her symptoms had resolved.  Due to concern for possible TIA or CVA, the patient had a CT angio head and neck which showed age-indeterminate infarcts in the left caudate head and right parietal lobe, recommended MRI for further evaluation. Patient was admitted for further workup.  Assessment & Plan    Principal Problem:   TIA (transient ischemic attack) -Presented with slurred speech and inability to get the words out, headache and lightheadedness.  Patient had similar episode couple of weeks ago. -CTA head and neck showed age-indeterminate infarcts in the left caudate head and right parietal lobe -Neurology consulted, MRI showed no acute intracranial abnormality, mild age-related cerebral atrophy with chronic small vessel ischemic disease -2D echo pending. -EEG showed no seizures or epileptiform discharges -PT OT ST evaluation, at baseline -Recent lipid panel on 09/26/2022 showed LDL 76, HDL  79, cholesterol 178 -Recent hemoglobin A1c on 09/24/2022 5.7 -Not on aspirin or Plavix due to thrombocytopenia.  Consulted patient's hematologist, Dr Al Pimple, recommended to weigh risk/benefit and okay to reattempt if needed, platelets >50K.  Will await neurology, Dr Marlis Edelson recommendations.   Active Problems:   Hyperlipidemia -Continue Lipitor    Hypothyroidism -Resume Synthroid 50 mcg daily    CKD (chronic kidney disease) stage 3b, GFR 30-59 ml/min (HCC) -Creatinine 1.1, at baseline 1.1-1.3   Chronic thrombocytopenia (HCC) -Patient has been following hematology outpatient, Dr Al Pimple, had appointment today. -Per hematology, likely etiology is immune mediated versus early bone marrow disorder. - f/u PLT count    IDA (iron deficiency anemia) -H&H stable    Paroxysmal atrial fibrillation (HCC) - last DCCV 2022. AC stopped by cardiology in the setting of thrombocytopenia    Primary hypertension - BP stable   Estimated body mass index is 26.64 kg/m as calculated from the following:   Height as of this encounter: 5' 2.5" (1.588 m).   Weight as of this encounter: 67.1 kg.  Code Status:  DVT Prophylaxis:  SCDs Start: 10/17/22 1923   Level of Care: Level of care: Telemetry Medical Family Communication: Updated patient Disposition Plan:  Remains inpatient appropriate:      Procedures:    Consultants:   Neurology Hematology   Antimicrobials:     Medications  aspirin EC  81 mg Jillian Daily   atorvastatin  80 mg Jillian Daily      Subjective:   Jillian Rodgers was seen and examined today.  Speech improved, no aphasia or dysarthria.  patient denies dizziness, chest pain, shortness of breath, abdominal pain, N/V. No acute events overnight.    Objective:   Vitals:   11-04-22 0433 11-04-2022 0510 11-04-2022 0732 2022/11/04 1114  BP: (!) 223/124 (!) 147/97 (!) 135/99 (!) 143/95  Pulse: 61 66 69 74  Resp:   18   Temp: 98.1 F (36.7 C)  98.2 F (36.8 C) 98.3 F (36.8 C)   TempSrc: Jillian  Jillian Jillian  SpO2: 100%  96% 99%  Weight:      Height:        Intake/Output Summary (Last 24 hours) at 04-Nov-2022 1310 Last data filed at 11-04-22 0300 Gross per 24 hour  Intake 348.61 ml  Output --  Net 348.61 ml     Wt Readings from Last 3 Encounters:  10/17/22 67.1 kg  09/26/22 70.4 kg  08/30/22 70.9 kg     Exam General: Alert and oriented x 3, NAD Cardiovascular: S1 S2 auscultated,  RRR Respiratory: Clear to auscultation bilaterally, no wheezing Gastrointestinal: Soft, nontender, nondistended, + bowel sounds Ext: no pedal edema bilaterally Neuro: Strength 5/5 upper and lower extremities bilaterally Skin: No rashes Psych: Normal affect     Data Reviewed:  I have personally reviewed following labs    CBC Lab Results  Component Value Date   WBC 5.3 Nov 04, 2022   RBC 4.41 04-Nov-2022   HGB 13.1 2022-11-04   HCT 39.5 11-04-22   MCV 89.6 November 04, 2022   MCH 29.7 11-04-2022   PLT 41 (L) 11-04-22   MCHC 33.2 November 04, 2022   RDW 12.2 Nov 04, 2022   LYMPHSABS 1.3 10/17/2022   MONOABS 0.6 10/17/2022   EOSABS 0.1 10/17/2022   BASOSABS 0.0 10/17/2022     Last metabolic panel Lab Results  Component Value Date   NA 138 11/04/22   K 3.8 2022/11/04   CL 104 11-04-22   CO2 26 11/04/2022   BUN 18 11-04-2022   CREATININE 1.17 (H) 2022/11/04   GLUCOSE 90 11-04-22   GFRNONAA 48 (L) 11/04/2022   GFRAA 52 (L) 10/20/2020   CALCIUM 9.8 11-04-2022   PROT 7.6 10/17/2022   ALBUMIN 4.9 10/17/2022   BILITOT 0.8 10/17/2022   ALKPHOS 69 10/17/2022   AST 18 10/17/2022   ALT 13 10/17/2022   ANIONGAP 8 11-04-2022    CBG (last 3)  Recent Labs    10/17/22 1055  GLUCAP 80      Coagulation Profile: No results for input(s): "INR", "PROTIME" in the last 168 hours.   Radiology Studies: I have personally reviewed the imaging studies  EEG adult  Result Date: 11-04-22 Charlsie Quest, MD     2022-11-04 12:18 PM Patient Name: Jillian Rodgers MRN:  [id number] Epilepsy Attending: Charlsie Quest Referring Physician/Provider: Erick Blinks, MD Date: Nov 04, 2022 Duration: 23.55 mins Patient history: 77 y.o. female with PMH significant for thrombocytopenia, GERD, HLD, HTN, pAfibb not on AC who presents with episode of aphasia, lightheadedness and slurred speech. EEG to evaluate for seizure. Level of alertness: Awake, asleep AEDs during EEG study: None Technical aspects: This EEG study was done with scalp electrodes positioned according to the 10-20 International system of  electrode placement. Electrical activity was reviewed with band pass filter of 1-70Hz , sensitivity of 7 uV/mm, display speed of 20mm/sec with a 60Hz  notched filter applied as appropriate. EEG data were recorded continuously and digitally stored.  Video monitoring was available and reviewed as appropriate. Description: The posterior dominant rhythm consists of 9 Hz activity of moderate voltage (25-35 uV) seen predominantly in posterior head regions, symmetric and reactive to eye opening and eye closing. Sleep was characterized by vertex waves, sleep spindles (12 to 14 Hz), maximal frontocentral region. Hyperventilation and photic stimulation were not performed.   IMPRESSION: This study is within normal limits. No seizures or epileptiform discharges were seen throughout the recording. A normal interictal EEG does not exclude the diagnosis of epilepsy. Charlsie Quest   MR BRAIN WO CONTRAST  Result Date: 10/18/2022 CLINICAL DATA:  Initial evaluation for neuro deficit, stroke suspected. EXAM: MRI HEAD WITHOUT CONTRAST TECHNIQUE: Multiplanar, multiecho pulse sequences of the brain and surrounding structures were obtained without intravenous contrast. COMPARISON:  Prior CT from earlier the same day. FINDINGS: Brain: Mild age-related cerebral atrophy. Patchy T2/FLAIR hyperintensity involving the periventricular deep white matter both cerebral hemispheres, consistent with chronic small vessel  ischemic disease, mild for age. Few small superimposed remote lacunar infarcts noted about the bilateral basal ganglia. No abnormal foci of restricted diffusion to suggest acute or subacute ischemia. Gray-white matter differentiation maintained. No areas of chronic cortical infarction. No acute or chronic intracranial blood products. No mass lesion, midline shift or mass effect. No hydrocephalus or extra-axial fluid collection. Pituitary gland and suprasellar region within normal limits. Vascular: Major intracranial vascular flow voids are maintained. Skull and upper cervical spine: Craniocervical junction within normal limits. Bone marrow signal intensity normal. No scalp soft tissue abnormality. Sinuses/Orbits: Prior bilateral ocular lens replacement. Paranasal sinuses are largely clear. Trace left mastoid effusion noted, of doubtful significance. Other: None. IMPRESSION: 1. No acute intracranial abnormality. 2. Mild age-related cerebral atrophy with chronic small vessel ischemic disease. Electronically Signed   By: Rise Mu M.D.   On: 10/18/2022 04:51   CT ANGIO HEAD NECK W WO CM  Result Date: 10/17/2022 CLINICAL DATA:  Neuro deficit, acute, stroke suspected EXAM: CT ANGIOGRAPHY HEAD AND NECK WITH AND WITHOUT CONTRAST TECHNIQUE: Multidetector CT imaging of the head and neck was performed using the standard protocol during bolus administration of intravenous contrast. Multiplanar CT image reconstructions and MIPs were obtained to evaluate the vascular anatomy. Carotid stenosis measurements (when applicable) are obtained utilizing NASCET criteria, using the distal internal carotid diameter as the denominator. RADIATION DOSE REDUCTION: This exam was performed according to the departmental dose-optimization program which includes automated exposure control, adjustment of the mA and/or kV according to patient size and/or use of iterative reconstruction technique. CONTRAST:  60mL OMNIPAQUE IOHEXOL 350  MG/ML SOLN COMPARISON:  None Available. FINDINGS: CT HEAD FINDINGS Brain: Age indeterminate infarct in the left caudate head (series 7, image 52), and in the right parietal lobe (series 7, image 18) no evidence of hemorrhage, hydrocephalus, extra-axial collection or mass lesion/mass effect. Sequela of moderate chronic microvascular ischemic change with generalized volume loss. Vascular: See below Skull: Normal. Negative for fracture or focal lesion. Sinuses/Orbits: No middle ear or mastoid effusion. Paranasal sinuses are clear. Bilateral lens replacement. Orbits are otherwise unremarkable. Other: None. Review of the MIP images confirms the above findings CTA NECK FINDINGS Aortic arch: Standard branching. Imaged portion shows no evidence of aneurysm or dissection. No significant stenosis of the major arch vessel origins. Right carotid system:  No evidence of dissection, stenosis (50% or greater), or occlusion. Left carotid system: No evidence of dissection, stenosis (50% or greater), or occlusion. Vertebral arteries: Codominant. No evidence of dissection, stenosis (50% or greater), or occlusion. Skeleton: Negative. Other neck: Negative. Upper chest: Negative. Review of the MIP images confirms the above findings CTA HEAD FINDINGS Anterior circulation: No significant stenosis, proximal occlusion, aneurysm, or vascular malformation. Posterior circulation: No significant stenosis, proximal occlusion, aneurysm, or vascular malformation. Venous sinuses: As permitted by contrast timing, patent. Anatomic variants: None Review of the MIP images confirms the above findings IMPRESSION: 1. Age indeterminate infarcts in the left caudate head and right parietal lobe. If there is concern for acute infarct, consider MRI for further evaluation. 2. No intracranial large vessel occlusion or significant stenosis. 3. No hemodynamically significant stenosis in the neck. Electronically Signed   By: Lorenza Cambridge M.D.   On: 10/17/2022 12:57    DG Chest Portable 1 View  Result Date: 10/17/2022 CLINICAL DATA:  Weakness and lightheadedness, near syncope. Headache. EXAM: PORTABLE CHEST 1 VIEW COMPARISON:  08/30/2022 FINDINGS: Retrocardiac density compatible with moderate-sized hiatal hernia. Atherosclerotic aortic arch and descending thoracic aorta. Mild scarring or atelectasis along the lingula. The lungs appear otherwise clear. No blunting of the costophrenic angles. No acute bony findings. Right upper quadrant clips compatible with prior cholecystectomy. IMPRESSION: 1. Moderate-sized hiatal hernia. 2.  Aortic Atherosclerosis (ICD10-I70.0). 3. Mild scarring or atelectasis along the lingula. Electronically Signed   By: Gaylyn Rong M.D.   On: 10/17/2022 12:19       Siyon Linck M.D. [facility] 10/18/2022, 1:10 PM  Available via Epic secure chat 7am-7pm After 7 pm, please refer to night coverage provider listed on amion.

## 2022-10-18 NOTE — Procedures (Signed)
Patient Name: BRINLYN NICKOLAS  MRN: [id number]  Epilepsy Attending: Charlsie Quest  Referring Physician/Provider: Erick Blinks, MD  Date: 10/18/2022 Duration: 23.55 mins  Patient history: 77 y.o. female with PMH significant for thrombocytopenia, GERD, HLD, HTN, pAfibb not on AC who presents with episode of aphasia, lightheadedness and slurred speech. EEG to evaluate for seizure.  Level of alertness: Awake, asleep  AEDs during EEG study: None  Technical aspects: This EEG study was done with scalp electrodes positioned according to the 10-20 International system of electrode placement. Electrical activity was reviewed with band pass filter of 1-70Hz , sensitivity of 7 uV/mm, display speed of 45mm/sec with a 60Hz  notched filter applied as appropriate. EEG data were recorded continuously and digitally stored.  Video monitoring was available and reviewed as appropriate.  Description: The posterior dominant rhythm consists of 9 Hz activity of moderate voltage (25-35 uV) seen predominantly in posterior head regions, symmetric and reactive to eye opening and eye closing. Sleep was characterized by vertex waves, sleep spindles (12 to 14 Hz), maximal frontocentral region. Hyperventilation and photic stimulation were not performed.     IMPRESSION: This study is within normal limits. No seizures or epileptiform discharges were seen throughout the recording.  A normal interictal EEG does not exclude the diagnosis of epilepsy.  Vianna Venezia Annabelle Harman

## 2022-10-20 ENCOUNTER — Inpatient Hospital Stay: Admission: RE | Admit: 2022-10-20 | Payer: Medicare PPO | Source: Ambulatory Visit

## 2022-10-23 NOTE — Therapy (Signed)
OUTPATIENT PHYSICAL THERAPY NEURO EVALUATION   Patient Name: Jillian Rodgers MRN: [id number] DOB:1946-02-14, 77 y.o., female Today's Date: 10/24/2022   PCP: [name], MD  REFERRING PROVIDER: Cathren Harsh, MD  END OF SESSION:  PT End of Session - 10/24/22 1613     Visit Number 1    Number of Visits 7    Date for PT Re-Evaluation 12/05/22    Authorization Type [employer]    Authorization Time Period auth submitted on eval    PT Start Time 1540   pt late   PT Stop Time 1612    PT Time Calculation (min) 32 min    Activity Tolerance Patient tolerated treatment well    Behavior During Therapy WFL for tasks assessed/performed             Past Medical History:  Diagnosis Date   Anemia with low platelet count (HCC)    Chronic kidney disease    Degenerative joint disease    Dysrhythmia    a fib   GERD (gastroesophageal reflux disease)    History of kidney stones    Hyperlipidemia    Hypothyroidism    Inguinal hernia 02/19/2019   Labile hypertension    Migraines    neurontin helps   Pneumonia    hx of walking pneumonia    Primary hypertension 04/28/2022   Past Surgical History:  Procedure Laterality Date   BILATERAL CATARACT SURGERY      BLERPHOROPLASTY \     CHOLECYSTECTOMY  1996   open   COLONOSCOPY  2009   recommended 10 year f/u   HERNIA REPAIR  1985   umb hernia rpr   HERNIA REPAIR  2005 & 2009   ventral hernia rpr   INGUINAL HERNIA REPAIR Left 04/30/2020   Procedure: OPEN LEFT INGUINAL HERNIA REPAIR WITH MESH;  Surgeon: Berna Bue, MD;  Location: [facility];  Service: General;  Laterality: Left;   INGUINAL HERNIA REPAIR Right 10/24/2021   Procedure: OPEN RIGHT INGUINAL HERNIA REPAIR WITH MESH ;  Surgeon: Berna Bue, MD;  Location: [facility];  Service: General;  Laterality: Right;   PILONIDAL CYST EXCISION  1972   SKIN CANCER REMOVAL      TONSILLECTOMY     UPPER GI ENDOSCOPY     VENTRAL HERNIA REPAIR     Patient Active Problem  List   Diagnosis Date Noted   TIA (transient ischemic attack) 10/17/2022   Primary hypertension 04/28/2022   Snoring 03/04/2021   Paroxysmal atrial fibrillation (HCC) 11/01/2020   Secondary hypercoagulable state (HCC) 11/01/2020   Increased urinary frequency 10/20/2020   Localized osteoporosis without current pathological fracture 10/20/2020   IDA (iron deficiency anemia) 04/07/2020   Thrombocytopenia (HCC) 11/25/2015   Encounter for Medicare annual wellness exam 06/14/2015   Overweight (BMI 25.0-29.9) 01/28/2015   CKD (chronic kidney disease) stage 3, GFR 30-59 ml/min (HCC) 01/19/2014   Upper airway cough syndrome 12/16/2013   Vitamin D deficiency 05/21/2013   Medication management 05/21/2013   Labile hypertension 01/31/2013   Degenerative joint disease    GERD (gastroesophageal reflux disease)    Hyperlipidemia    Hypothyroidism     ONSET DATE: 10/17/22  REFERRING DIAG: 7/2  THERAPY DIAG:  Muscle weakness (generalized)  Unsteadiness on feet  Rationale for Evaluation and Treatment: Rehabilitation  SUBJECTIVE:  SUBJECTIVE STATEMENT: Feeling "not good" since coming home from the hospital. Reports chronic headache behind the L eye. On Sunday, had an episode of LEs going rubbery and falling out from under her. Reports that she is afraid of doing things on her own as a result. Denies feeling lightheaded at that time. Reports that she has been fatigued and sleeping more- this has been going on since prior to hospitalization. Reports that she has a problem with BP dropping when she stands up. Reports dizziness for years on and off.   Pt accompanied by: significant other  PERTINENT HISTORY: Anemia, CKD, GERD, HLD, labile HTN, migraines,  chronic thrombocytopenia, paroxysmal a-fib  PAIN:  Are you  having pain? No  PRECAUTIONS: Fall  WEIGHT BEARING RESTRICTIONS: No  FALLS: Has patient fallen in last 6 months? No  LIVING ENVIRONMENT: Lives with: lives with their spouse Lives in: House/apartment Stairs:  2-8 steps to enter without rails; 1 story home Has following equipment at home: None  PLOF: Independent; retired; enjoys gardening   PATIENT GOALS: work on Herbalist, go back to senior center balance classes   OBJECTIVE:   Vitals:   DIAGNOSTIC FINDINGS:  per chart: CTA head and neck showed age-indeterminate infarcts in the left caudate head and right parietal lobe  10/17/22 brain MRI: No acute intracranial abnormality. Mild age-related cerebral atrophy with chronic small vessel ischemic disease.  COGNITION: Overall cognitive status: Within functional limits for tasks assessed   SENSATION: Reports R lateral thigh N/T since hernia surgery; none otherwise   COORDINATION: Alternating pronation/supination: WNL B Alternating toe tap: WNL B Finger to nose: WNL B Heel to shin: WNL B   MUSCLE TONE: normal B  POSTURE: rounded shoulders  LOWER EXTREMITY ROM:     Active  Right Eval Left Eval  Hip flexion    Hip extension    Hip abduction    Hip adduction    Hip internal rotation    Hip external rotation    Knee flexion    Knee extension    Ankle dorsiflexion 19 18  Ankle plantarflexion    Ankle inversion    Ankle eversion     (Blank rows = not tested)  LOWER EXTREMITY MMT:    MMT Right Eval Left Eval  Hip flexion 4- 4-  Hip extension    Hip abduction 4+ 4+  Hip adduction 4+ 4+  Hip internal rotation    Hip external rotation    Knee flexion 4+ 4  Knee extension 5 4+  Ankle dorsiflexion 4+ 4  Ankle plantarflexion 4+ 4  Ankle inversion    Ankle eversion    (Blank rows = not tested)  GAIT: Gait pattern:  slightly increased toe out  Assistive device utilized: None Level of assistance: Complete Independence   FUNCTIONAL TESTS:  5  times sit to stand: 10.95 sec without UEs (not standing fully upright) Timed up and go (TUG): 11.25 sec   TODAY'S TREATMENT:  DATE: 10/24/22    PATIENT EDUCATION: Education details: prognosis,POC, HEP, edu on orthostatic testing  Person educated: Patient and Spouse Education method: Explanation, Demonstration, Tactile cues, Verbal cues, and Handouts Education comprehension: verbalized understanding and returned demonstration  HOME EXERCISE PROGRAM: Access Code: [email or web address] URL: [email or web address] Date: 10/24/2022 Prepared by: [facility] - [facility]  Program Notes perform standing exercises at counter  Exercises - Mini Squat with Counter Support  - 1 x daily - 5 x weekly - 2 sets - 10 reps - Heel Toe Raises with Counter Support  - 1 x daily - 5 x weekly - 2 sets - 10 reps - Marching with Resistance  - 1 x daily - 5 x weekly - 2 sets - 10 reps - Seated Hamstring Curl with Anchored Resistance  - 1 x daily - 5 x weekly - 2 sets - 10 reps  GOALS: Goals reviewed with patient? Yes  SHORT TERM GOALS: Target date: 11/07/2022  Patient to be independent with initial HEP. Baseline: HEP initiated Goal status: INITIAL    LONG TERM GOALS: Target date: 12/05/2022  Patient to be independent with advanced HEP. Baseline: Not yet initiated  Goal status: INITIAL  Patient to demonstrate B LE strength >/=4+/5.  Baseline: See above Goal status: INITIAL  Patient to return to balance exercise classes with good tolerance safety.  Baseline: not returned Goal status: INITIAL  Patient to score at least 22/30 on FGA in order to decrease risk of falls.  Baseline: NT Goal status: INITIAL  ASSESSMENT:  CLINICAL IMPRESSION:  Patient is a 77 y/o F presenting to OPPT with c/o fatigue and HA s/p hospital admission 07/2-07/3 for  aphasia, headache and lightheadedness; suspected TIA. Patient today presenting with rounded posture and L>R LE weakness. Balance testing limited d/t time; plan to assess this next session. Patient was educated on strengthening HEP and reported understanding.  Would benefit from skilled PT services 1 x/week for 6 weeks to address aforementioned impairments in order to optimize level of function.    OBJECTIVE IMPAIRMENTS: Abnormal gait, decreased activity tolerance, decreased balance, decreased strength, dizziness, and postural dysfunction.   ACTIVITY LIMITATIONS: carrying, lifting, standing, squatting, stairs, bathing, toileting, and dressing  PARTICIPATION LIMITATIONS: meal prep, cleaning, laundry, driving, shopping, community activity, yard work, and church  PERSONAL FACTORS: Age, Behavior pattern, Fitness, Time since onset of injury/illness/exacerbation, and 3+ comorbidities: Anemia, CKD, GERD, HLD, labile HTN, migraines,  chronic thrombocytopenia, paroxysmal a-fib,   are also affecting patient's functional outcome.   REHAB POTENTIAL: Good  CLINICAL DECISION MAKING: Evolving/moderate complexity  EVALUATION COMPLEXITY: Moderate  PLAN:  PT FREQUENCY: 1x/week  PT DURATION: 6 weeks  PLANNED INTERVENTIONS: Therapeutic exercises, Therapeutic activity, Neuromuscular re-education, Balance training, Gait training, Patient/Family education, Self Care, Joint mobilization, Stair training, Vestibular training, Canalith repositioning, DME instructions, Aquatic Therapy, Dry Needling, Electrical stimulation, Cryotherapy, Moist heat, Taping, Manual therapy, and Re-evaluation  PLAN FOR NEXT SESSION: check orthostatics; review HEP, FGA and work on high level balance   Anette Guarneri, PT, DPT 10/24/22 5:05 PM  [facility] [address] [place], [place] [address] Phone # [phone] Fax # [phone]      Referring diagnosis? G45.9  (ICD-10-CM) - TIA (transient ischemic attack) Treatment diagnosis? (if different than referring diagnosis) Muscle weakness (generalized), Unsteadiness on feet  What was this (referring dx) caused by? []  Surgery []  Fall []  Ongoing issue []  Arthritis [x]  Other: _acute medical event___________  Laterality: []  Rt []   Lt [x]  Both  Check all possible CPT codes:  *CHOOSE 10 OR LESS*    []  97110 (Therapeutic Exercise)  []  92507 (SLP Treatment)  []  97112 (Neuro Re-ed)   []  92526 (Swallowing Treatment)   []  97116 (Gait Training)   []  [phone] (Cognitive Training, 1st 15 minutes) []  60454 (Manual Therapy)   []  97130 (Cognitive Training, each add'l 15 minutes)  []  97164 (Re-evaluation)                              []  Other, List CPT Code ____________  []  97530 (Therapeutic Activities)     []  97535 (Self Care)   [x]  All codes above (97110 - 97535)  []  97012 (Mechanical Traction)  []  97014 (E-stim Unattended)  []  97032 (E-stim manual)  []  97033 (Ionto)  []  97035 (Ultrasound) []  97750 (Physical Performance Training) []  [id number] (Aquatic Therapy) []  97016 (Vasopneumatic Device) []  [id number] (Paraffin) []  97034 (Contrast Bath) []  97597 (Wound Care 1st 20 sq cm) []  97598 (Wound Care each add'l 20 sq cm) []  97760 (Orthotic Fabrication, Fitting, Training Initial) []  [id number] (Prosthetic Management and Training Initial) []  [id number] (Orthotic or Prosthetic Training/ Modification Subsequent)

## 2022-10-24 ENCOUNTER — Encounter: Payer: Self-pay | Admitting: Physical Therapy

## 2022-10-24 ENCOUNTER — Other Ambulatory Visit: Payer: Self-pay

## 2022-10-24 ENCOUNTER — Encounter: Payer: Self-pay | Admitting: Hematology and Oncology

## 2022-10-24 ENCOUNTER — Ambulatory Visit: Payer: Medicare PPO | Attending: Internal Medicine | Admitting: Physical Therapy

## 2022-10-24 DIAGNOSIS — M6281 Muscle weakness (generalized): Secondary | ICD-10-CM | POA: Diagnosis not present

## 2022-10-24 DIAGNOSIS — R2681 Unsteadiness on feet: Secondary | ICD-10-CM | POA: Insufficient documentation

## 2022-10-24 DIAGNOSIS — G459 Transient cerebral ischemic attack, unspecified: Secondary | ICD-10-CM | POA: Insufficient documentation

## 2022-10-25 ENCOUNTER — Encounter: Payer: Self-pay | Admitting: Nurse Practitioner

## 2022-10-25 ENCOUNTER — Ambulatory Visit: Payer: Medicare PPO | Admitting: Nurse Practitioner

## 2022-10-25 VITALS — BP 110/80 | HR 80 | Temp 96.6°F | Ht 62.5 in | Wt 153.0 lb

## 2022-10-25 DIAGNOSIS — Z09 Encounter for follow-up examination after completed treatment for conditions other than malignant neoplasm: Secondary | ICD-10-CM | POA: Diagnosis not present

## 2022-10-25 DIAGNOSIS — I480 Paroxysmal atrial fibrillation: Secondary | ICD-10-CM | POA: Diagnosis not present

## 2022-10-25 DIAGNOSIS — Z79899 Other long term (current) drug therapy: Secondary | ICD-10-CM

## 2022-10-25 DIAGNOSIS — D696 Thrombocytopenia, unspecified: Secondary | ICD-10-CM

## 2022-10-25 DIAGNOSIS — G459 Transient cerebral ischemic attack, unspecified: Secondary | ICD-10-CM

## 2022-10-25 DIAGNOSIS — E782 Mixed hyperlipidemia: Secondary | ICD-10-CM

## 2022-10-25 DIAGNOSIS — I10 Essential (primary) hypertension: Secondary | ICD-10-CM | POA: Diagnosis not present

## 2022-10-25 NOTE — Progress Notes (Signed)
Hospital follow up  Assessment and Plan: Hospital visit follow up for:   Hospital discharge follow-up Reviewed discharge instructions in full including medication changes, diagnostics, labs, and future follow ups appointment. All questions and concerns addressed.   - Ambulatory referral to Neurology  TIA (transient ischemic attack) Continue to follow with Dr. Pearlean Brownie Will reach out to try and obtain follow up appointment sooner than 01/15/23. Continue ASA, Lipitor.  - Ambulatory referral to Neurology  Hyperlipidemia, mixed Continue Lipitor Discussed lifestyle modifications. Recommended diet heavy in fruits and veggies, omega 3's. Decrease consumption of animal meats, cheeses, and dairy products. Remain active and exercise as tolerated. Continue to monitor. Check lipids/TSH  Paroxysmal atrial fibrillation (HCC) Continue ASA per neurology instruction. Continue to monitor CBC/Platelets  Thrombocytopenia (HCC) Continue to monitor CBC/Platelets  Primary hypertension Controlled Discussed DASH (Dietary Approaches to Stop Hypertension) DASH diet is lower in sodium than a typical American diet. Cut back on foods that are high in saturated fat, cholesterol, and trans fats. Eat more whole-grain foods, fish, poultry, and nuts Remain active and exercise as tolerated daily.  Monitor BP at home-Call if greater than 130/80.  Check CMP/CBC  Medication management All medications discussed and reviewed in full. All questions and concerns regarding medications addressed.     All questions answered fully, and patient was encouraged to call the office with any further questions or concerns. Discussed goal to avoid readmission related to this diagnosis.   Over 40 minutes of exam, counseling, chart review, and complex, high/moderate level critical decision making was performed this visit.   Future Appointments  Date Time Provider Department Center  10/30/2022  4:15 PM Maryruth Bun, PT [facility]  11/06/2022  4:15 PM Anette Guarneri D, PT [facility]  11/13/2022  2:00 PM Dion Body, PT [facility]  11/20/2022  4:15 PM Maryruth Bun, PT [facility]  01/03/2023  2:30 PM Adela Glimpse, NP [facility] None  01/15/2023  9:00 AM Micki Riley, MD [facility] None  04/05/2023  2:30 PM [name], MD [facility] None  07/04/2023  2:30 PM Adela Glimpse, NP [facility] None  10/09/2023  2:00 PM [name], MD [facility] None     HPI 77 y.o.female presents for follow up for transition from recent hospitalization stay. Admit date to the hospital was 10/17/22, patient was discharged from the hospital on 10/18/22 and our clinical staff contacted the office the day after discharge to set up a follow up appointment. The discharge summary, medications, and diagnostic test results were reviewed before meeting with the patient. The patient was admitted for TIA.  Jillian Rodgers is a 77 y.o. 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  This happened around 9:30 AM.  Per the patient it lasted about 10 to 15 minutes.  Associated with slurred speech, lightheadedness, and headache.  Reportedly a couple of weeks ago, the patient had an episode of confusion while at the dentist office.  She was having difficulty understanding speech.   In the ED, her symptoms had resolved.  Due to concern for possible TIA or CVA, the patient had a CT angio head and neck which showed age-indeterminate infarcts in the left caudate head and right parietal lobe, recommended MRI for further evaluation. Patient was admitted for further workup. MRI showed no acute  intracranial abnormality, mild age-related cerebral atrophy  with chronic small vessel ischemic disease.  Neurology, Dr. Pearlean Brownie ws consulted.  2D echo showed EF of 60 to 65%, G1 DD, saline contrast bubble study negative, no evidence of interatrial shunt. EEG showed no seizures or epileptiform discharges      Home health {ACTION; IS/IS [id number]} involved.   Images while in the hospital: ECHOCARDIOGRAM COMPLETE BUBBLE STUDY  Result Date: 10/18/2022    ECHOCARDIOGRAM REPORT   Patient Name:   Jillian Rodgers Date of Exam: 10/18/2022 Medical Rec #:  [id number]         Height:       62.5 in Accession #:    [id number]        Weight:       148.0 lb Date of Birth:  [birth year]         BSA:          1.692 m Patient Age:    21 years          BP:           143/95 mmHg Patient Gender: F                 HR:           68 bpm. Exam Location:  Inpatient Procedure: 2D Echo, Color Doppler, Cardiac Doppler and Saline Contrast Bubble            Study Indications:    stroke  History:        Patient has prior history of Echocardiogram examinations, most                 recent 12/14/2020. Chronic kidney disease; Risk                 Factors:Hypertension and Dyslipidemia.  Sonographer:    Delcie Roch RDCS Referring Phys: [id number] CAROLE N HALL IMPRESSIONS  1. Left ventricular ejection fraction, by estimation, is 60 to 65%. The left ventricle has normal function. The left ventricle has no regional wall motion abnormalities. There is mild left ventricular hypertrophy. Left ventricular diastolic parameters are consistent with Grade I diastolic dysfunction (impaired relaxation).  2. Right ventricular systolic function is normal. The right ventricular size is normal. There is normal pulmonary artery systolic pressure. The estimated right ventricular systolic pressure is 24.7 mmHg.  3. The mitral valve is normal in structure. Trivial mitral valve regurgitation.  4. The aortic valve is tricuspid. Aortic valve regurgitation is not visualized. Aortic valve sclerosis is present, with no evidence  of aortic valve stenosis.  5. The inferior vena cava is normal in size with greater than 50% respiratory variability, suggesting right atrial pressure of 3 mmHg.  6. Agitated saline contrast bubble study was negative, with no evidence of any interatrial shunt. FINDINGS  Left Ventricle: Left ventricular ejection fraction, by estimation, is 60 to 65%. The left ventricle has normal function. The left ventricle has no regional wall motion abnormalities. The left ventricular internal cavity size was small. There is mild left ventricular hypertrophy. Left ventricular diastolic parameters are consistent with Grade I diastolic dysfunction (impaired relaxation). Right Ventricle: The right ventricular size is normal. No increase in right ventricular wall thickness. Right ventricular systolic function is normal. There is normal pulmonary artery systolic pressure. The tricuspid regurgitant velocity is 2.33 m/s, and  with an assumed right atrial pressure of 3 mmHg, the estimated right ventricular systolic pressure is 24.7 mmHg. Left Atrium: Left atrial size was normal in size.  Right Atrium: Right atrial size was normal in size. Pericardium: There is no evidence of pericardial effusion. Mitral Valve: The mitral valve is normal in structure. Trivial mitral valve regurgitation. Tricuspid Valve: The tricuspid valve is normal in structure. Tricuspid valve regurgitation is trivial. Aortic Valve: The aortic valve is tricuspid. Aortic valve regurgitation is not visualized. Aortic valve sclerosis is present, with no evidence of aortic valve stenosis. Pulmonic Valve: The pulmonic valve was not well visualized. Pulmonic valve regurgitation is trivial. Aorta: The aortic root and ascending aorta are structurally normal, with no evidence of dilitation. Venous: The inferior vena cava is normal in size with greater than 50% respiratory variability, suggesting right atrial pressure of 3 mmHg. IAS/Shunts: The interatrial septum was not well  visualized. Agitated saline contrast was given intravenously to evaluate for intracardiac shunting. Agitated saline contrast bubble study was negative, with no evidence of any interatrial shunt.  LEFT VENTRICLE PLAX 2D LVIDd:         3.50 cm   Diastology LVIDs:         2.30 cm   LV e' medial:    5.66 cm/s LV PW:         1.00 cm   LV E/e' medial:  11.6 LV IVS:        1.10 cm   LV e' lateral:   9.68 cm/s LVOT diam:     1.80 cm   LV E/e' lateral: 6.8 LV SV:         54 LV SV Index:   32 LVOT Area:     2.54 cm  RIGHT VENTRICLE             IVC RV Basal diam:  2.80 cm     IVC diam: 2.10 cm RV S prime:     12.50 cm/s TAPSE (M-mode): 2.3 cm LEFT ATRIUM             Index        RIGHT ATRIUM           Index LA diam:        3.30 cm 1.95 cm/m   RA Area:     13.40 cm LA Vol (A2C):   53.7 ml 31.74 ml/m  RA Volume:   31.20 ml  18.44 ml/m LA Vol (A4C):   46.2 ml 27.30 ml/m LA Biplane Vol: 50.2 ml 29.67 ml/m  AORTIC VALVE LVOT Vmax:   107.00 cm/s LVOT Vmean:  68.300 cm/s LVOT VTI:    0.213 m  AORTA Ao Root diam: 2.90 cm Ao Asc diam:  3.60 cm MITRAL VALVE               TRICUSPID VALVE MV Area (PHT): 3.53 cm    TR Peak grad:   21.7 mmHg MV Decel Time: 215 msec    TR Vmax:        233.00 cm/s MV E velocity: 65.60 cm/s MV A velocity: 82.50 cm/s  SHUNTS MV E/A ratio:  0.80        Systemic VTI:  0.21 m                            Systemic Diam: 1.80 cm Epifanio Lesches MD Electronically signed by Epifanio Lesches MD Signature Date/Time: 10/18/2022/1:24:17 PM    Final    EEG adult  Result Date: 10/18/2022 Charlsie Quest, MD     10/18/2022 12:18 PM Patient Name: Jillian Rodgers MRN: [id number] Epilepsy Attending:  Charlsie Quest Referring Physician/Provider: Erick Blinks, MD Date: 10/18/2022 Duration: 23.55 mins Patient history: 77 y.o. female with PMH significant for thrombocytopenia, GERD, HLD, HTN, pAfibb not on AC who presents with episode of aphasia, lightheadedness and slurred speech. EEG to evaluate for seizure.  Level of alertness: Awake, asleep AEDs during EEG study: None Technical aspects: This EEG study was done with scalp electrodes positioned according to the 10-20 International system of electrode placement. Electrical activity was reviewed with band pass filter of 1-70Hz , sensitivity of 7 uV/mm, display speed of 38mm/sec with a 60Hz  notched filter applied as appropriate. EEG data were recorded continuously and digitally stored.  Video monitoring was available and reviewed as appropriate. Description: The posterior dominant rhythm consists of 9 Hz activity of moderate voltage (25-35 uV) seen predominantly in posterior head regions, symmetric and reactive to eye opening and eye closing. Sleep was characterized by vertex waves, sleep spindles (12 to 14 Hz), maximal frontocentral region. Hyperventilation and photic stimulation were not performed.   IMPRESSION: This study is within normal limits. No seizures or epileptiform discharges were seen throughout the recording. A normal interictal EEG does not exclude the diagnosis of epilepsy. Charlsie Quest   MR BRAIN WO CONTRAST  Result Date: 10/18/2022 CLINICAL DATA:  Initial evaluation for neuro deficit, stroke suspected. EXAM: MRI HEAD WITHOUT CONTRAST TECHNIQUE: Multiplanar, multiecho pulse sequences of the brain and surrounding structures were obtained without intravenous contrast. COMPARISON:  Prior CT from earlier the same day. FINDINGS: Brain: Mild age-related cerebral atrophy. Patchy T2/FLAIR hyperintensity involving the periventricular deep white matter both cerebral hemispheres, consistent with chronic small vessel ischemic disease, mild for age. Few small superimposed remote lacunar infarcts noted about the bilateral basal ganglia. No abnormal foci of restricted diffusion to suggest acute or subacute ischemia. Gray-white matter differentiation maintained. No areas of chronic cortical infarction. No acute or chronic intracranial blood products. No mass lesion,  midline shift or mass effect. No hydrocephalus or extra-axial fluid collection. Pituitary gland and suprasellar region within normal limits. Vascular: Major intracranial vascular flow voids are maintained. Skull and upper cervical spine: Craniocervical junction within normal limits. Bone marrow signal intensity normal. No scalp soft tissue abnormality. Sinuses/Orbits: Prior bilateral ocular lens replacement. Paranasal sinuses are largely clear. Trace left mastoid effusion noted, of doubtful significance. Other: None. IMPRESSION: 1. No acute intracranial abnormality. 2. Mild age-related cerebral atrophy with chronic small vessel ischemic disease. Electronically Signed   By: Rise Mu M.D.   On: 10/18/2022 04:51   CT ANGIO HEAD NECK W WO CM  Result Date: 10/17/2022 CLINICAL DATA:  Neuro deficit, acute, stroke suspected EXAM: CT ANGIOGRAPHY HEAD AND NECK WITH AND WITHOUT CONTRAST TECHNIQUE: Multidetector CT imaging of the head and neck was performed using the standard protocol during bolus administration of intravenous contrast. Multiplanar CT image reconstructions and MIPs were obtained to evaluate the vascular anatomy. Carotid stenosis measurements (when applicable) are obtained utilizing NASCET criteria, using the distal internal carotid diameter as the denominator. RADIATION DOSE REDUCTION: This exam was performed according to the departmental dose-optimization program which includes automated exposure control, adjustment of the mA and/or kV according to patient size and/or use of iterative reconstruction technique. CONTRAST:  60mL OMNIPAQUE IOHEXOL 350 MG/ML SOLN COMPARISON:  None Available. FINDINGS: CT HEAD FINDINGS Brain: Age indeterminate infarct in the left caudate head (series 7, image 57), and in the right parietal lobe (series 7, image 18) no evidence of hemorrhage, hydrocephalus, extra-axial collection or mass lesion/mass effect. Sequela of moderate chronic microvascular ischemic  change with  generalized volume loss. Vascular: See below Skull: Normal. Negative for fracture or focal lesion. Sinuses/Orbits: No middle ear or mastoid effusion. Paranasal sinuses are clear. Bilateral lens replacement. Orbits are otherwise unremarkable. Other: None. Review of the MIP images confirms the above findings CTA NECK FINDINGS Aortic arch: Standard branching. Imaged portion shows no evidence of aneurysm or dissection. No significant stenosis of the major arch vessel origins. Right carotid system: No evidence of dissection, stenosis (50% or greater), or occlusion. Left carotid system: No evidence of dissection, stenosis (50% or greater), or occlusion. Vertebral arteries: Codominant. No evidence of dissection, stenosis (50% or greater), or occlusion. Skeleton: Negative. Other neck: Negative. Upper chest: Negative. Review of the MIP images confirms the above findings CTA HEAD FINDINGS Anterior circulation: No significant stenosis, proximal occlusion, aneurysm, or vascular malformation. Posterior circulation: No significant stenosis, proximal occlusion, aneurysm, or vascular malformation. Venous sinuses: As permitted by contrast timing, patent. Anatomic variants: None Review of the MIP images confirms the above findings IMPRESSION: 1. Age indeterminate infarcts in the left caudate head and right parietal lobe. If there is concern for acute infarct, consider MRI for further evaluation. 2. No intracranial large vessel occlusion or significant stenosis. 3. No hemodynamically significant stenosis in the neck. Electronically Signed   By: Lorenza Cambridge M.D.   On: 10/17/2022 12:57   DG Chest Portable 1 View  Result Date: 10/17/2022 CLINICAL DATA:  Weakness and lightheadedness, near syncope. Headache. EXAM: PORTABLE CHEST 1 VIEW COMPARISON:  08/30/2022 FINDINGS: Retrocardiac density compatible with moderate-sized hiatal hernia. Atherosclerotic aortic arch and descending thoracic aorta. Mild scarring or atelectasis along the  lingula. The lungs appear otherwise clear. No blunting of the costophrenic angles. No acute bony findings. Right upper quadrant clips compatible with prior cholecystectomy. IMPRESSION: 1. Moderate-sized hiatal hernia. 2.  Aortic Atherosclerosis (ICD10-I70.0). 3. Mild scarring or atelectasis along the lingula. Electronically Signed   By: Gaylyn Rong M.D.   On: 10/17/2022 12:19     Current Outpatient Medications (Endocrine & Metabolic):    levothyroxine (SYNTHROID) 50 MCG tablet, TAKE 1 TABLET BY MOUTH DAILY ON A EMPTY STOMACH WITH WATER. DO NOT TAKE ANY ANTACID, CALCIUM OR MAGNESIUM FOR 4 HOURS. AVOID BIOTIN  Current Outpatient Medications (Cardiovascular):    atorvastatin (LIPITOR) 80 MG tablet, Take 1 tablet (80 mg total) by mouth daily.  Current Outpatient Medications (Respiratory):    cetirizine (ZYRTEC) 10 MG tablet, Take 10 mg by mouth daily.  Current Outpatient Medications (Analgesics):    acetaminophen (TYLENOL) 325 MG tablet, Take 650 mg by mouth every 6 (six) hours as needed for moderate pain or headache.   aspirin EC 81 MG tablet, Take 1 tablet (81 mg total) by mouth daily. Swallow whole.   Current Outpatient Medications (Other):    Ascorbic Acid (VITAMIN C) 500 MG CAPS, Take 500 mg by mouth daily.    BOTOX 100 units SOLR injection, Inject 100 Units into the muscle See admin instructions. Inject 100 units intramuscularly every 6-8 weeks   Cholecalciferol (VITAMIN D) 125 MCG (5000 UT) CAPS, Take 5,000 Units by mouth daily.   divalproex (DEPAKOTE ER) 500 MG 24 hr tablet, Take 1 tablet (500 mg total) by mouth daily.   gabapentin (NEURONTIN) 300 MG capsule, Take 300 mg (1 tablet) in the morning and 600 mg (2 tablets) at bedtime   pantoprazole (PROTONIX) 40 MG tablet, Take  1 tablet  2 x /day  for Acid Indigestion & Heartburn   Polyethyl Glycol-Propyl Glycol (SYSTANE ULTRA) 0.4-0.3 % SOLN,  Place 1 drop into both eyes 3 (three) times daily as needed (dry eyes).   Probiotic  Product (PROBIOTIC PO), Take 1 tablet by mouth daily.   Propylene Glycol (SYSTANE BALANCE) 0.6 % SOLN, Place 1 drop into both eyes at bedtime.   zinc gluconate 50 MG tablet, Take 50 mg by mouth daily.  Past Medical History:  Diagnosis Date   Anemia with low platelet count (HCC)    Chronic kidney disease    Degenerative joint disease    Dysrhythmia    a fib   GERD (gastroesophageal reflux disease)    History of kidney stones    Hyperlipidemia    Hypothyroidism    Inguinal hernia 02/19/2019   Labile hypertension    Migraines    neurontin helps   Pneumonia    hx of walking pneumonia    Primary hypertension 04/28/2022     No Known Allergies  ROS: all negative except above.   Physical Exam: Filed Weights   10/25/22 1515  Weight: 153 lb (69.4 kg)   BP 110/80   Pulse 80   Temp (!) 96.6 F (35.9 C)   Ht 5' 2.5" (1.588 m)   Wt 153 lb (69.4 kg)   SpO2 96%   BMI 27.54 kg/m  General Appearance: Well nourished, in no apparent distress. Eyes: PERRLA, EOMs, conjunctiva no swelling or erythema Sinuses: No Frontal/maxillary tenderness ENT/Mouth: Ext aud canals clear, TMs without erythema, bulging. No erythema, swelling, or exudate on post pharynx.  Tonsils not swollen or erythematous. Hearing normal.  Neck: Supple, thyroid normal.  Respiratory: Respiratory effort normal, BS equal bilaterally without rales, rhonchi, wheezing or stridor.  Cardio: RRR with no MRGs. Brisk peripheral pulses without edema.  Abdomen: Soft, + BS.  Non tender, no guarding, rebound, hernias, masses. Lymphatics: Non tender without lymphadenopathy.  Musculoskeletal: Full ROM, 5/5 strength, normal gait.  Skin: Warm, dry without rashes, lesions, ecchymosis.  Neuro: Cranial nerves intact. Normal muscle tone, no cerebellar symptoms. Sensation intact.  Psych: Awake and oriented X 3, normal affect, Insight and Judgment appropriate.     Adela Glimpse, NP 3:36 PM [facility]

## 2022-10-26 ENCOUNTER — Encounter: Payer: Self-pay | Admitting: Nurse Practitioner

## 2022-10-26 NOTE — Patient Instructions (Signed)
Transient Ischemic Attack A transient ischemic attack (TIA) causes the same symptoms as a stroke, but the symptoms go away quickly. A TIA happens when blood flow to the brain is blocked. Having a TIA means you may be at risk for a stroke. A TIA is a medical emergency. What are the causes? A TIA is caused by a blocked artery in the head or neck. This means the brain does not get the blood supply it needs. A blockage can be caused by: Fatty buildup in an artery in the head or neck. A blood clot. A tear in an artery. Irritation and swelling (inflammation) of an artery. Sometimes the cause is not known. What increases the risk? Certain things may make you more likely to have a TIA. Some of these are things that you can change, such as: Using products that have nicotine or tobacco. Not being active. Drinking too much alcohol. Using recreational drugs. Health conditions that may increase your risk include: High blood pressure. High cholesterol. Diabetes. Heart disease. A heartbeat that is not regular (atrial fibrillation). Sickle cell disease. Problems with blood clotting. Other risk factors include: Being over the age of 60. Being female. Being very overweight. Sleep problems (sleep apnea). Having a family history of stroke. Having had blood clots, stroke, TIA, or heart attack in the past. What are the signs or symptoms? The symptoms of a TIA are like those of a stroke. They can include: Weakness or loss of feeling in your face, arm, or leg. This often happens on one side of your body. Trouble walking. Trouble moving your arms or legs. Trouble talking or understanding what people are saying. Problems with how you see. Feeling dizzy. Feeling confused. Loss of balance or coordination. Feeling like you may vomit (nausea) or vomiting. Having a very bad headache. If you can, note what time you started to have symptoms. Tell your doctor. How is this treated? The goal of treatment is to  lower the risk for a stroke. This may include: Changes to diet and lifestyle, such as getting regular exercise and stopping smoking. Taking medicines to: Thin the blood. Lower blood pressure. Lower cholesterol. Treating other health conditions, such as diabetes. If testing shows that an artery in your brain is narrow, your doctor may recommend a procedure to: Take the blockage out of your artery. Open or widen an artery in your neck (carotid angioplasty and stenting). Follow these instructions at home: Medicines Take over-the-counter and prescription medicines only as told by your doctor. If you were told to take aspirin or another medicine to thin your blood, use it exactly as told by your doctor. Taking too much of the medicine can cause bleeding. Taking too little of the medicine may not work to treat the problem. Eating and drinking  Eat 5 or more servings of fruits and vegetables each day. Follow instructions from your doctor about your diet. You may need to follow a certain diet to help lower your risk of a stroke. You may need to: Eat a diet that is low in fat and salt. Eat foods with a lot of fiber. Limit carbohydrates and sugar. If you drink alcohol: Limit how much you have to: 0-1 drink a day for women who are not pregnant. 0-2 drinks a day for men. Know how much alcohol is in a drink. In the [place] one drink equals one 12 oz bottle of beer (355 mL), one 5 oz glass of wine (148 mL), or one 1 oz glass of hard   liquor (44 mL). General instructions Keep a healthy weight. Try to get at least 30 minutes of exercise on most days. Get treatment if you have sleep problems. Do not smoke or use any products that contain nicotine or tobacco. If you need help quitting, ask your doctor. Do not use drugs. Keep all follow-up visits. Your doctor will want to know if you have any more symptoms and to check blood labs if any medicines were prescribed. Where to find more  information American Stroke Association: stroke.org Get help right away if: You have chest pain. You have a heartbeat that is not regular. You have any signs of a stroke. "BE FAST" is an easy way to remember the main warning signs: B - Balance. Dizziness, sudden trouble walking, or loss of balance. E - Eyes. Trouble seeing or a change in how you see. F - Face. Sudden weakness or loss of feeling of the face. The face or eyelid may droop on one side. A - Arms. Weakness or loss of feeling in an arm. This happens all of a sudden and most often on one side of the body. S - Speech. Sudden trouble speaking, slurred speech, or trouble understanding what people say. T - Time. Time to call emergency services. Write down what time symptoms started. You have other signs of a stroke, such as: A sudden, very bad headache with no known cause. Feeling like you may vomit. Vomiting. A seizure. These symptoms may be an emergency. Get help right away. Call 911. Do not wait to see if the symptoms will go away. Do not drive yourself to the hospital. This information is not intended to replace advice given to you by your health care provider. Make sure you discuss any questions you have with your health care provider. Document Revised: 09/16/2021 Document Reviewed: 09/16/2021 Elsevier Patient Education  2024 Elsevier Inc.  

## 2022-10-30 ENCOUNTER — Ambulatory Visit: Payer: Medicare PPO | Admitting: Physical Therapy

## 2022-11-03 NOTE — Therapy (Signed)
OUTPATIENT PHYSICAL THERAPY NEURO TREATMENT   Patient Name: Jillian Rodgers MRN: [id number] DOB:1946-01-08, 77 y.o., female Today's Date: 11/06/2022   PCP: [name], MD  REFERRING PROVIDER: Cathren Harsh, MD  END OF SESSION:  PT End of Session - 11/06/22 1701     Visit Number 2    Number of Visits 7    Date for PT Re-Evaluation 12/05/22    Authorization Type [employer]    Authorization Time Period approved 7 PT visits from 10/24/2022-12/05/2022    Authorization - Visit Number 2    Authorization - Number of Visits 7    PT Start Time 1619    PT Stop Time 1659    PT Time Calculation (min) 40 min    Activity Tolerance Patient tolerated treatment well    Behavior During Therapy WFL for tasks assessed/performed              Past Medical History:  Diagnosis Date   Anemia with low platelet count (HCC)    Chronic kidney disease    Degenerative joint disease    Dysrhythmia    a fib   GERD (gastroesophageal reflux disease)    History of kidney stones    Hyperlipidemia    Hypothyroidism    Inguinal hernia 02/19/2019   Labile hypertension    Migraines    neurontin helps   Pneumonia    hx of walking pneumonia    Primary hypertension 04/28/2022   Past Surgical History:  Procedure Laterality Date   BILATERAL CATARACT SURGERY      BLERPHOROPLASTY \     CHOLECYSTECTOMY  1996   open   COLONOSCOPY  2009   recommended 10 year f/u   HERNIA REPAIR  1985   umb hernia rpr   HERNIA REPAIR  2005 & 2009   ventral hernia rpr   INGUINAL HERNIA REPAIR Left 04/30/2020   Procedure: OPEN LEFT INGUINAL HERNIA REPAIR WITH MESH;  Surgeon: Berna Bue, MD;  Location: [facility];  Service: General;  Laterality: Left;   INGUINAL HERNIA REPAIR Right 10/24/2021   Procedure: OPEN RIGHT INGUINAL HERNIA REPAIR WITH MESH ;  Surgeon: Berna Bue, MD;  Location: [facility];  Service: General;  Laterality: Right;   PILONIDAL CYST EXCISION  1972   SKIN CANCER REMOVAL       TONSILLECTOMY     UPPER GI ENDOSCOPY     VENTRAL HERNIA REPAIR     Patient Active Problem List   Diagnosis Date Noted   TIA (transient ischemic attack) 10/17/2022   Primary hypertension 04/28/2022   Snoring 03/04/2021   Paroxysmal atrial fibrillation (HCC) 11/01/2020   Secondary hypercoagulable state (HCC) 11/01/2020   Increased urinary frequency 10/20/2020   Localized osteoporosis without current pathological fracture 10/20/2020   IDA (iron deficiency anemia) 04/07/2020   Thrombocytopenia (HCC) 11/25/2015   Encounter for Medicare annual wellness exam 06/14/2015   Overweight (BMI 25.0-29.9) 01/28/2015   CKD (chronic kidney disease) stage 3, GFR 30-59 ml/min (HCC) 01/19/2014   Upper airway cough syndrome 12/16/2013   Vitamin D deficiency 05/21/2013   Medication management 05/21/2013   Labile hypertension 01/31/2013   Degenerative joint disease    GERD (gastroesophageal reflux disease)    Hyperlipidemia    Hypothyroidism     ONSET DATE: 10/17/22  REFERRING DIAG: 7/2  THERAPY DIAG:  Muscle weakness (generalized)  Unsteadiness on feet  Rationale for Evaluation and Treatment: Rehabilitation  SUBJECTIVE:  SUBJECTIVE STATEMENT: "Not too good." Started doing the exercises and the top of the R foot started hurting her so she had to stop. Cancelled her last appt d/t a migraine. Reports at least 2 episodes of feeling like she was going to pass out without any falls.   Pt accompanied by: significant other  PERTINENT HISTORY: Anemia, CKD, GERD, HLD, labile HTN, migraines,  chronic thrombocytopenia, paroxysmal a-fib  PAIN:  Are you having pain? No  PRECAUTIONS: Fall  WEIGHT BEARING RESTRICTIONS: No  FALLS: Has patient fallen in last 6 months? No  LIVING ENVIRONMENT: Lives with: lives with  their spouse Lives in: House/apartment Stairs:  2-8 steps to enter without rails; 1 story home Has following equipment at home: None  PLOF: Independent; retired; enjoys gardening   PATIENT GOALS: work on Development worker, international aid and strength, go back to senior center balance classes   OBJECTIVE:   TODAY'S TREATMENT: 11/06/22     Orthostatic Testing   Supine Sitting Standing  x1 Minute Standing x 3 Minutes  BP 147/105 mmHg 111/85 *lightheaded 94/74 *lightheaded 107/87 *lightheaded  HR 66 bpm 70 77 75     Activity Comments  review of HEP: mini squat 10x  heel/toe raise 10x  resisted marching x20 3# sitting HS curl x10 3# Cueing to shift wt posteriorly with squats; compensatory wt shift with heel/toe raise (pt reports R foot feeling funny in the toes) reported good tolerance of resisted strengthening with ankle wts    HOME EXERCISE PROGRAM Last updated: 11/06/22 Access Code: [email or web address] URL: [email or web address] Date: 11/06/2022 Prepared by: [facility] - Outpatient  Rehab - [facility]  Program Notes perform standing exercises at counter  Exercises - Seated Heel Toe Raises  - 1 x daily - 5 x weekly - 2 sets - 10 reps - Mini Squat with Counter Support  - 1 x daily - 5 x weekly - 2 sets - 10 reps - Standing March with Counter Support with ankle wts - 1 x daily - 5 x weekly - 2 sets - 20 reps - Standing Alternating Knee Flexion with Ankle Weights  - 1 x daily - 5 x weekly - 2 sets - 10 reps  PATIENT EDUCATION: Education details: edu on orthostatic testing and edu on self-management , answered pt's questions on her concern about not getting in with a neurologist sooner (encouraged being put on waiting list) , HEP update  Person educated: Patient Education method: Explanation, Demonstration, Tactile cues, Verbal cues, and Handouts Education comprehension: verbalized understanding and returned demonstration    Below measures were taken at time of initial evaluation unless  otherwise specified:   Vitals:   DIAGNOSTIC FINDINGS:  per chart: CTA head and neck showed age-indeterminate infarcts in the left caudate head and right parietal lobe  10/17/22 brain MRI: No acute intracranial abnormality. Mild age-related cerebral atrophy with chronic small vessel ischemic disease.  COGNITION: Overall cognitive status: Within functional limits for tasks assessed   SENSATION: Reports R lateral thigh N/T since hernia surgery; none otherwise   COORDINATION: Alternating pronation/supination: WNL B Alternating toe tap: WNL B Finger to nose: WNL B Heel to shin: WNL B   MUSCLE TONE: normal B  POSTURE: rounded shoulders  LOWER EXTREMITY ROM:     Active  Right Eval Left Eval  Hip flexion    Hip extension    Hip abduction    Hip adduction    Hip internal rotation    Hip external rotation    Knee flexion    Knee  extension    Ankle dorsiflexion 19 18  Ankle plantarflexion    Ankle inversion    Ankle eversion     (Blank rows = not tested)  LOWER EXTREMITY MMT:    MMT Right Eval Left Eval  Hip flexion 4- 4-  Hip extension    Hip abduction 4+ 4+  Hip adduction 4+ 4+  Hip internal rotation    Hip external rotation    Knee flexion 4+ 4  Knee extension 5 4+  Ankle dorsiflexion 4+ 4  Ankle plantarflexion 4+ 4  Ankle inversion    Ankle eversion    (Blank rows = not tested)  GAIT: Gait pattern:  slightly increased toe out  Assistive device utilized: None Level of assistance: Complete Independence   FUNCTIONAL TESTS:  5 times sit to stand: 10.95 sec without UEs (not standing fully upright) Timed up and go (TUG): 11.25 sec   TODAY'S TREATMENT:                                                                                                                              DATE: 10/24/22    PATIENT EDUCATION: Education details: prognosis,POC, HEP, edu on orthostatic testing  Person educated: Patient and Spouse Education method: Explanation,  Demonstration, Tactile cues, Verbal cues, and Handouts Education comprehension: verbalized understanding and returned demonstration  HOME EXERCISE PROGRAM: Access Code: [email or web address] URL: [email or web address] Date: 10/24/2022 Prepared by: [facility] - Outpatient  Rehab - [facility]  Program Notes perform standing exercises at counter  Exercises - Mini Squat with Counter Support  - 1 x daily - 5 x weekly - 2 sets - 10 reps - Heel Toe Raises with Counter Support  - 1 x daily - 5 x weekly - 2 sets - 10 reps - Marching with Resistance  - 1 x daily - 5 x weekly - 2 sets - 10 reps - Seated Hamstring Curl with Anchored Resistance  - 1 x daily - 5 x weekly - 2 sets - 10 reps  GOALS: Goals reviewed with patient? Yes  SHORT TERM GOALS: Target date: 11/07/2022  Patient to be independent with initial HEP. Baseline: HEP initiated Goal status: IN PROGRESS    LONG TERM GOALS: Target date: 12/05/2022  Patient to be independent with advanced HEP. Baseline: Not yet initiated  Goal status: IN PROGRESS  Patient to demonstrate B LE strength >/=4+/5.  Baseline: See above Goal status: IN PROGRESS  Patient to return to balance exercise classes with good tolerance safety.  Baseline: not returned Goal status: IN PROGRESS  Patient to score at least 22/30 on FGA in order to decrease risk of falls.  Baseline: NT Goal status: IN PROGRESS  ASSESSMENT:  CLINICAL IMPRESSION:  Patient reports R foot pain from her HEP which she has since discontinued as a result. Also reports 2 episodes of near-syncope without actual fall since last session. Orthostatic testing was positive. Educated patient on self-management of this condition and received verbal  consent to make other providers aware. Reviewed HEP to assess for tolerance and modified activities for improved comfort. Patient reported improved tolerance for activities after review and without complaints upon leaving.    OBJECTIVE  IMPAIRMENTS: Abnormal gait, decreased activity tolerance, decreased balance, decreased strength, dizziness, and postural dysfunction.   ACTIVITY LIMITATIONS: carrying, lifting, standing, squatting, stairs, bathing, toileting, and dressing  PARTICIPATION LIMITATIONS: meal prep, cleaning, laundry, driving, shopping, community activity, yard work, and church  PERSONAL FACTORS: Age, Behavior pattern, Fitness, Time since onset of injury/illness/exacerbation, and 3+ comorbidities: Anemia, CKD, GERD, HLD, labile HTN, migraines,  chronic thrombocytopenia, paroxysmal a-fib,   are also affecting patient's functional outcome.   REHAB POTENTIAL: Good  CLINICAL DECISION MAKING: Evolving/moderate complexity  EVALUATION COMPLEXITY: Moderate  PLAN:  PT FREQUENCY: 1x/week  PT DURATION: 6 weeks  PLANNED INTERVENTIONS: Therapeutic exercises, Therapeutic activity, Neuromuscular re-education, Balance training, Gait training, Patient/Family education, Self Care, Joint mobilization, Stair training, Vestibular training, Canalith repositioning, DME instructions, Aquatic Therapy, Dry Needling, Electrical stimulation, Cryotherapy, Moist heat, Taping, Manual therapy, and Re-evaluation  PLAN FOR NEXT SESSION: review HEP, FGA and work on high level balance   Anette Guarneri, PT, DPT 11/06/22 5:02 PM   Outpatient Rehab at [facility] [address] [place], [place] [address] Phone # [phone] Fax # [phone]

## 2022-11-06 ENCOUNTER — Telehealth: Payer: Self-pay | Admitting: Physical Therapy

## 2022-11-06 ENCOUNTER — Ambulatory Visit: Payer: Medicare PPO | Admitting: Physical Therapy

## 2022-11-06 ENCOUNTER — Encounter: Payer: Self-pay | Admitting: Physical Therapy

## 2022-11-06 DIAGNOSIS — M6281 Muscle weakness (generalized): Secondary | ICD-10-CM

## 2022-11-06 DIAGNOSIS — R2681 Unsteadiness on feet: Secondary | ICD-10-CM | POA: Diagnosis not present

## 2022-11-06 DIAGNOSIS — G459 Transient cerebral ischemic attack, unspecified: Secondary | ICD-10-CM | POA: Diagnosis not present

## 2022-11-06 NOTE — Telephone Encounter (Signed)
Hi Dr. Oneta Rack,  Jillian Rodgers is being seen in OPPT s/p TIA. She reports ongoing episodes of pre-syncope. Please see orthostatic testing below and advise if any changes to POC.            Orthostatic Testing    Supine Sitting Standing  x1 Minute Standing x 3 Minutes  BP 147/105 mmHg 111/85 *lightheaded 94/74 *lightheaded 107/87 *lightheaded  HR 66 bpm 70 77 75        Thanks!   Anette Guarneri, PT, DPT 11/06/22 5:06 PM  [facility] at [facility] [address] [place], [address] [place], [place] [address] Phone # [phone] Fax # [phone]

## 2022-11-08 ENCOUNTER — Telehealth: Payer: Self-pay | Admitting: Hematology and Oncology

## 2022-11-08 NOTE — Telephone Encounter (Signed)
Patient is aware of rescheduled appointment times/dates 

## 2022-11-09 DIAGNOSIS — D1801 Hemangioma of skin and subcutaneous tissue: Secondary | ICD-10-CM | POA: Diagnosis not present

## 2022-11-09 DIAGNOSIS — Z85828 Personal history of other malignant neoplasm of skin: Secondary | ICD-10-CM | POA: Diagnosis not present

## 2022-11-09 DIAGNOSIS — L814 Other melanin hyperpigmentation: Secondary | ICD-10-CM | POA: Diagnosis not present

## 2022-11-09 DIAGNOSIS — L821 Other seborrheic keratosis: Secondary | ICD-10-CM | POA: Diagnosis not present

## 2022-11-09 DIAGNOSIS — L810 Postinflammatory hyperpigmentation: Secondary | ICD-10-CM | POA: Diagnosis not present

## 2022-11-13 ENCOUNTER — Ambulatory Visit: Payer: Medicare PPO

## 2022-11-13 DIAGNOSIS — M6281 Muscle weakness (generalized): Secondary | ICD-10-CM | POA: Diagnosis not present

## 2022-11-13 DIAGNOSIS — R2681 Unsteadiness on feet: Secondary | ICD-10-CM

## 2022-11-13 DIAGNOSIS — G459 Transient cerebral ischemic attack, unspecified: Secondary | ICD-10-CM | POA: Diagnosis not present

## 2022-11-13 NOTE — Therapy (Signed)
OUTPATIENT PHYSICAL THERAPY NEURO TREATMENT   Patient Name: Jillian Rodgers MRN: [id number] DOB:November 24, 1945, 77 y.o., female Today's Date: 11/13/2022   PCP: [name], MD  REFERRING PROVIDER: Cathren Harsh, MD  END OF SESSION:  PT End of Session - 11/13/22 1407     Visit Number 3    Number of Visits 7    Date for PT Re-Evaluation 12/05/22    Authorization Type [employer]    Authorization Time Period approved 7 PT visits from 10/24/2022-12/05/2022    Authorization - Visit Number 3    Authorization - Number of Visits 7    PT Start Time 1406    PT Stop Time 1445    PT Time Calculation (min) 39 min    Activity Tolerance Patient tolerated treatment well    Behavior During Therapy WFL for tasks assessed/performed              Past Medical History:  Diagnosis Date   Anemia with low platelet count (HCC)    Chronic kidney disease    Degenerative joint disease    Dysrhythmia    a fib   GERD (gastroesophageal reflux disease)    History of kidney stones    Hyperlipidemia    Hypothyroidism    Inguinal hernia 02/19/2019   Labile hypertension    Migraines    neurontin helps   Pneumonia    hx of walking pneumonia    Primary hypertension 04/28/2022   Past Surgical History:  Procedure Laterality Date   BILATERAL CATARACT SURGERY      BLERPHOROPLASTY \     CHOLECYSTECTOMY  1996   open   COLONOSCOPY  2009   recommended 10 year f/u   HERNIA REPAIR  1985   umb hernia rpr   HERNIA REPAIR  2005 & 2009   ventral hernia rpr   INGUINAL HERNIA REPAIR Left 04/30/2020   Procedure: OPEN LEFT INGUINAL HERNIA REPAIR WITH MESH;  Surgeon: Berna Bue, MD;  Location: [facility];  Service: General;  Laterality: Left;   INGUINAL HERNIA REPAIR Right 10/24/2021   Procedure: OPEN RIGHT INGUINAL HERNIA REPAIR WITH MESH ;  Surgeon: Berna Bue, MD;  Location: [facility];  Service: General;  Laterality: Right;   PILONIDAL CYST EXCISION  1972   SKIN CANCER REMOVAL       TONSILLECTOMY     UPPER GI ENDOSCOPY     VENTRAL HERNIA REPAIR     Patient Active Problem List   Diagnosis Date Noted   TIA (transient ischemic attack) 10/17/2022   Primary hypertension 04/28/2022   Snoring 03/04/2021   Paroxysmal atrial fibrillation (HCC) 11/01/2020   Secondary hypercoagulable state (HCC) 11/01/2020   Increased urinary frequency 10/20/2020   Localized osteoporosis without current pathological fracture 10/20/2020   IDA (iron deficiency anemia) 04/07/2020   Thrombocytopenia (HCC) 11/25/2015   Encounter for Medicare annual wellness exam 06/14/2015   Overweight (BMI 25.0-29.9) 01/28/2015   CKD (chronic kidney disease) stage 3, GFR 30-59 ml/min (HCC) 01/19/2014   Upper airway cough syndrome 12/16/2013   Vitamin D deficiency 05/21/2013   Medication management 05/21/2013   Labile hypertension 01/31/2013   Degenerative joint disease    GERD (gastroesophageal reflux disease)    Hyperlipidemia    Hypothyroidism     ONSET DATE: 10/17/22  REFERRING DIAG: 7/2  THERAPY DIAG:  Muscle weakness (generalized)  Unsteadiness on feet  Rationale for Evaluation and Treatment: Rehabilitation  SUBJECTIVE:  SUBJECTIVE STATEMENT: Still experiencing lightheadedness with position change.   Pt accompanied by: significant other  PERTINENT HISTORY: Anemia, CKD, GERD, HLD, labile HTN, migraines,  chronic thrombocytopenia, paroxysmal a-fib  PAIN:  Are you having pain? No  PRECAUTIONS: Fall  WEIGHT BEARING RESTRICTIONS: No  FALLS: Has patient fallen in last 6 months? No  LIVING ENVIRONMENT: Lives with: lives with their spouse Lives in: House/apartment Stairs:  2-8 steps to enter without rails; 1 story home Has following equipment at home: None  PLOF: Independent; retired; enjoys gardening    PATIENT GOALS: work on Development worker, international aid and strength, go back to senior center balance classes   OBJECTIVE:    TODAY'S TREATMENT: 11/13/22 Activity Comments  Vitals (seated) 115/84 mmHg, 73 bpm,   Vitals (standing) x 1 min 93/74 mmHg, 80 bpm  NU-step level 5 x 6 min   Vital signs 132/90 mmHg, 73 bpm  Instruction in seated/reclined LE PRE to improve activity tolerance and reduce  See HEP additions        TODAY'S TREATMENT: 11/06/22  Orthostatic Testing   Supine Sitting Standing  x1 Minute Standing x 3 Minutes  BP 147/105 mmHg 111/85 *lightheaded 94/74 *lightheaded 107/87 *lightheaded  HR 66 bpm 70 77 75     Activity Comments  review of HEP: mini squat 10x  heel/toe raise 10x  resisted marching x20 3# sitting HS curl x10 3# Cueing to shift wt posteriorly with squats; compensatory wt shift with heel/toe raise (pt reports R foot feeling funny in the toes) reported good tolerance of resisted strengthening with ankle wts    HOME EXERCISE PROGRAM Last updated: 11/06/22 Access Code: [email or web address] URL: [email or web address] Date: 11/06/2022 Prepared by: [facility] - Outpatient  Rehab - [facility]  Program Notes perform standing exercises at counter  Exercises - Seated Heel Toe Raises  - 1 x daily - 5 x weekly - 2 sets - 10 reps - Mini Squat with Counter Support  - 1 x daily - 5 x weekly - 2 sets - 10 reps - Standing March with Counter Support with ankle wts - 1 x daily - 5 x weekly - 2 sets - 20 reps - Standing Alternating Knee Flexion with Ankle Weights  - 1 x daily - 5 x weekly - 2 sets - 10 reps - Supine Quad Set  - 1 x daily - 7 x weekly - 3 sets - 10 reps - 2 sec hold - Seated Long Arc Quad  - 1 x daily - 7 x weekly - 3 sets - 10 reps - 2 sec hold - Seated Hip Adduction Isometrics with Ball  - 1 x daily - 7 x weekly - 3 sets - 10 reps - 2 sec hold  PATIENT EDUCATION: Education details: edu on orthostatic testing and edu on self-management , answered pt's  questions on her concern about not getting in with a neurologist sooner (encouraged being put on waiting list) , HEP update  Person educated: Patient Education method: Explanation, Demonstration, Tactile cues, Verbal cues, and Handouts Education comprehension: verbalized understanding and returned demonstration    Below measures were taken at time of initial evaluation unless otherwise specified:   Vitals:   DIAGNOSTIC FINDINGS:  per chart: CTA head and neck showed age-indeterminate infarcts in the left caudate head and right parietal lobe  10/17/22 brain MRI: No acute intracranial abnormality. Mild age-related cerebral atrophy with chronic small vessel ischemic disease.  COGNITION: Overall cognitive status: Within functional limits for tasks assessed   SENSATION: Reports R lateral  thigh N/T since hernia surgery; none otherwise   COORDINATION: Alternating pronation/supination: WNL B Alternating toe tap: WNL B Finger to nose: WNL B Heel to shin: WNL B   MUSCLE TONE: normal B  POSTURE: rounded shoulders  LOWER EXTREMITY ROM:     Active  Right Eval Left Eval  Hip flexion    Hip extension    Hip abduction    Hip adduction    Hip internal rotation    Hip external rotation    Knee flexion    Knee extension    Ankle dorsiflexion 19 18  Ankle plantarflexion    Ankle inversion    Ankle eversion     (Blank rows = not tested)  LOWER EXTREMITY MMT:    MMT Right Eval Left Eval  Hip flexion 4- 4-  Hip extension    Hip abduction 4+ 4+  Hip adduction 4+ 4+  Hip internal rotation    Hip external rotation    Knee flexion 4+ 4  Knee extension 5 4+  Ankle dorsiflexion 4+ 4  Ankle plantarflexion 4+ 4  Ankle inversion    Ankle eversion    (Blank rows = not tested)  GAIT: Gait pattern:  slightly increased toe out  Assistive device utilized: None Level of assistance: Complete Independence   FUNCTIONAL TESTS:  5 times sit to stand: 10.95 sec without UEs (not  standing fully upright) Timed up and go (TUG): 11.25 sec   TODAY'S TREATMENT:                                                                                                                              DATE: 10/24/22    PATIENT EDUCATION: Education details: prognosis,POC, HEP, edu on orthostatic testing  Person educated: Patient and Spouse Education method: Explanation, Demonstration, Tactile cues, Verbal cues, and Handouts Education comprehension: verbalized understanding and returned demonstration  HOME EXERCISE PROGRAM: Access Code: [email or web address] URL: [email or web address] Date: 10/24/2022 Prepared by: [facility] - Outpatient  Rehab - [facility]  Program Notes perform standing exercises at counter  Exercises - Mini Squat with Counter Support  - 1 x daily - 5 x weekly - 2 sets - 10 reps - Heel Toe Raises with Counter Support  - 1 x daily - 5 x weekly - 2 sets - 10 reps - Marching with Resistance  - 1 x daily - 5 x weekly - 2 sets - 10 reps - Seated Hamstring Curl with Anchored Resistance  - 1 x daily - 5 x weekly - 2 sets - 10 reps  GOALS: Goals reviewed with patient? Yes  SHORT TERM GOALS: Target date: 11/07/2022  Patient to be independent with initial HEP. Baseline: HEP initiated Goal status: IN PROGRESS    LONG TERM GOALS: Target date: 12/05/2022  Patient to be independent with advanced HEP. Baseline: Not yet initiated  Goal status: IN PROGRESS  Patient to demonstrate B LE strength >/=4+/5.  Baseline: See above Goal status: IN  PROGRESS  Patient to return to balance exercise classes with good tolerance safety.  Baseline: not returned Goal status: IN PROGRESS  Patient to score at least 22/30 on FGA in order to decrease risk of falls.  Baseline: NT Goal status: IN PROGRESS  ASSESSMENT:  CLINICAL IMPRESSION: Continuing to experience lightheadedness and pre-syncope feelings with sit to stand.  Measures reveal decrease in systolic/diastolic from  sitting to standing.  Pt educated on techniques to improve activity tolerance and performance of seated LE exercises for large muscle group recruitment to hopefully improve symptoms.  Good BP response to seated cardiovascular exericse with increase in BP but not much change to HR.  Continued sessions to progress POC details.    OBJECTIVE IMPAIRMENTS: Abnormal gait, decreased activity tolerance, decreased balance, decreased strength, dizziness, and postural dysfunction.   ACTIVITY LIMITATIONS: carrying, lifting, standing, squatting, stairs, bathing, toileting, and dressing  PARTICIPATION LIMITATIONS: meal prep, cleaning, laundry, driving, shopping, community activity, yard work, and church  PERSONAL FACTORS: Age, Behavior pattern, Fitness, Time since onset of injury/illness/exacerbation, and 3+ comorbidities: Anemia, CKD, GERD, HLD, labile HTN, migraines,  chronic thrombocytopenia, paroxysmal a-fib,   are also affecting patient's functional outcome.   REHAB POTENTIAL: Good  CLINICAL DECISION MAKING: Evolving/moderate complexity  EVALUATION COMPLEXITY: Moderate  PLAN:  PT FREQUENCY: 1x/week  PT DURATION: 6 weeks  PLANNED INTERVENTIONS: Therapeutic exercises, Therapeutic activity, Neuromuscular re-education, Balance training, Gait training, Patient/Family education, Self Care, Joint mobilization, Stair training, Vestibular training, Canalith repositioning, DME instructions, Aquatic Therapy, Dry Needling, Electrical stimulation, Cryotherapy, Moist heat, Taping, Manual therapy, and Re-evaluation  PLAN FOR NEXT SESSION: review HEP, FGA and work on high level balance   2:52 PM, 11/13/22 M. Shary Decamp, PT, DPT Physical Therapist- [place] Office Number: [phone]

## 2022-11-15 ENCOUNTER — Other Ambulatory Visit: Payer: Self-pay | Admitting: Internal Medicine

## 2022-11-15 DIAGNOSIS — G518 Other disorders of facial nerve: Secondary | ICD-10-CM | POA: Diagnosis not present

## 2022-11-15 DIAGNOSIS — E039 Hypothyroidism, unspecified: Secondary | ICD-10-CM

## 2022-11-15 DIAGNOSIS — G43719 Chronic migraine without aura, intractable, without status migrainosus: Secondary | ICD-10-CM | POA: Diagnosis not present

## 2022-11-15 DIAGNOSIS — M542 Cervicalgia: Secondary | ICD-10-CM | POA: Diagnosis not present

## 2022-11-15 DIAGNOSIS — M791 Myalgia, unspecified site: Secondary | ICD-10-CM | POA: Diagnosis not present

## 2022-11-15 MED ORDER — LEVOTHYROXINE SODIUM 50 MCG PO TABS
ORAL_TABLET | ORAL | 3 refills | Status: AC
Start: 2022-11-15 — End: ?

## 2022-11-20 ENCOUNTER — Ambulatory Visit: Payer: Medicare PPO | Attending: Internal Medicine | Admitting: Physical Therapy

## 2022-11-20 DIAGNOSIS — M6281 Muscle weakness (generalized): Secondary | ICD-10-CM | POA: Insufficient documentation

## 2022-11-20 DIAGNOSIS — R2681 Unsteadiness on feet: Secondary | ICD-10-CM | POA: Insufficient documentation

## 2022-12-01 ENCOUNTER — Ambulatory Visit: Payer: Medicare PPO

## 2022-12-05 ENCOUNTER — Ambulatory Visit: Payer: Medicare PPO

## 2022-12-05 DIAGNOSIS — R2681 Unsteadiness on feet: Secondary | ICD-10-CM | POA: Diagnosis not present

## 2022-12-05 DIAGNOSIS — M6281 Muscle weakness (generalized): Secondary | ICD-10-CM | POA: Diagnosis not present

## 2022-12-05 NOTE — Therapy (Signed)
OUTPATIENT PHYSICAL THERAPY NEURO TREATMENT and Recertification   Patient Name: Jillian Rodgers MRN: [id number] DOB:1946-01-04, 77 y.o., female Today's Date: 12/05/2022   PCP: [name], MD  REFERRING PROVIDER: Cathren Harsh, MD  END OF SESSION:  PT End of Session - 12/05/22 1414     Visit Number 4    Number of Visits 8    Date for PT Re-Evaluation 01/02/23    Authorization Type [employer]    Authorization Time Period approved 7 PT visits from 10/24/2022-12/05/2022    Authorization - Visit Number 4    Authorization - Number of Visits 7    PT Start Time 1412   late arrival   PT Stop Time 1445    PT Time Calculation (min) 33 min    Activity Tolerance Patient tolerated treatment well    Behavior During Therapy WFL for tasks assessed/performed              Past Medical History:  Diagnosis Date   Anemia with low platelet count (HCC)    Chronic kidney disease    Degenerative joint disease    Dysrhythmia    a fib   GERD (gastroesophageal reflux disease)    History of kidney stones    Hyperlipidemia    Hypothyroidism    Inguinal hernia 02/19/2019   Labile hypertension    Migraines    neurontin helps   Pneumonia    hx of walking pneumonia    Primary hypertension 04/28/2022   Past Surgical History:  Procedure Laterality Date   BILATERAL CATARACT SURGERY      BLERPHOROPLASTY \     CHOLECYSTECTOMY  1996   open   COLONOSCOPY  2009   recommended 10 year f/u   HERNIA REPAIR  1985   umb hernia rpr   HERNIA REPAIR  2005 & 2009   ventral hernia rpr   INGUINAL HERNIA REPAIR Left 04/30/2020   Procedure: OPEN LEFT INGUINAL HERNIA REPAIR WITH MESH;  Surgeon: Berna Bue, MD;  Location: [facility];  Service: General;  Laterality: Left;   INGUINAL HERNIA REPAIR Right 10/24/2021   Procedure: OPEN RIGHT INGUINAL HERNIA REPAIR WITH MESH ;  Surgeon: Berna Bue, MD;  Location: [facility];  Service: General;  Laterality: Right;   PILONIDAL CYST EXCISION  1972    SKIN CANCER REMOVAL      TONSILLECTOMY     UPPER GI ENDOSCOPY     VENTRAL HERNIA REPAIR     Patient Active Problem List   Diagnosis Date Noted   TIA (transient ischemic attack) 10/17/2022   Primary hypertension 04/28/2022   Snoring 03/04/2021   Paroxysmal atrial fibrillation (HCC) 11/01/2020   Secondary hypercoagulable state (HCC) 11/01/2020   Increased urinary frequency 10/20/2020   Localized osteoporosis without current pathological fracture 10/20/2020   IDA (iron deficiency anemia) 04/07/2020   Thrombocytopenia (HCC) 11/25/2015   Encounter for Medicare annual wellness exam 06/14/2015   Overweight (BMI 25.0-29.9) 01/28/2015   CKD (chronic kidney disease) stage 3, GFR 30-59 ml/min (HCC) 01/19/2014   Upper airway cough syndrome 12/16/2013   Vitamin D deficiency 05/21/2013   Medication management 05/21/2013   Labile hypertension 01/31/2013   Degenerative joint disease    GERD (gastroesophageal reflux disease)    Hyperlipidemia    Hypothyroidism     ONSET DATE: 10/17/22  REFERRING DIAG: 7/2  THERAPY DIAG:  Muscle weakness (generalized)  Unsteadiness on feet  Rationale for Evaluation and Treatment: Rehabilitation  SUBJECTIVE:  SUBJECTIVE STATEMENT: Have not been having any episodes, but felt lightheaded with sit to stand while in waiting room.  Pt notes recent hx of BLE swelling with prolonged car trip  Pt accompanied by: significant other  PERTINENT HISTORY: Anemia, CKD, GERD, HLD, labile HTN, migraines,  chronic thrombocytopenia, paroxysmal a-fib  PAIN:  Are you having pain? No  PRECAUTIONS: Fall  WEIGHT BEARING RESTRICTIONS: No  FALLS: Has patient fallen in last 6 months? No  LIVING ENVIRONMENT: Lives with: lives with their spouse Lives in: House/apartment Stairs:  2-8  steps to enter without rails; 1 story home Has following equipment at home: None  PLOF: Independent; retired; enjoys gardening   PATIENT GOALS: work on Development worker, international aid and strength, go back to senior center balance classes   OBJECTIVE:   TODAY'S TREATMENT: 12/05/22 Activity Comments  Vitals (standing): 78/64 mmHg, 88 bpm   Functional Gait Assessment 26/30  Vitals (standing) 75/67 mmHg, 90 bpm                     HOME EXERCISE PROGRAM Last updated: 11/06/22 Access Code: [email or web address] URL: [email or web address] Date: 11/06/2022 Prepared by: [facility] - Outpatient  Rehab - [facility]  Program Notes perform standing exercises at counter  Exercises - Seated Heel Toe Raises  - 1 x daily - 5 x weekly - 2 sets - 10 reps - Mini Squat with Counter Support  - 1 x daily - 5 x weekly - 2 sets - 10 reps - Standing March with Counter Support with ankle wts - 1 x daily - 5 x weekly - 2 sets - 20 reps - Standing Alternating Knee Flexion with Ankle Weights  - 1 x daily - 5 x weekly - 2 sets - 10 reps - Supine Quad Set  - 1 x daily - 7 x weekly - 3 sets - 10 reps - 2 sec hold - Seated Long Arc Quad  - 1 x daily - 7 x weekly - 3 sets - 10 reps - 2 sec hold - Seated Hip Adduction Isometrics with Ball  - 1 x daily - 7 x weekly - 3 sets - 10 reps - 2 sec hold  PATIENT EDUCATION: Education details: edu on orthostatic testing and edu on self-management , answered pt's questions on her concern about not getting in with a neurologist sooner (encouraged being put on waiting list) , HEP update  Person educated: Patient Education method: Explanation, Demonstration, Tactile cues, Verbal cues, and Handouts Education comprehension: verbalized understanding and returned demonstration    Below measures were taken at time of initial evaluation unless otherwise specified:   Vitals:   DIAGNOSTIC FINDINGS:  per chart: CTA head and neck showed age-indeterminate infarcts in the left caudate  head and right parietal lobe  10/17/22 brain MRI: No acute intracranial abnormality. Mild age-related cerebral atrophy with chronic small vessel ischemic disease.  COGNITION: Overall cognitive status: Within functional limits for tasks assessed   SENSATION: Reports R lateral thigh N/T since hernia surgery; none otherwise   COORDINATION: Alternating pronation/supination: WNL B Alternating toe tap: WNL B Finger to nose: WNL B Heel to shin: WNL B   MUSCLE TONE: normal B  POSTURE: rounded shoulders  LOWER EXTREMITY ROM:     Active  Right Eval Left Eval  Hip flexion    Hip extension    Hip abduction    Hip adduction    Hip internal rotation    Hip external rotation    Knee flexion  Knee extension    Ankle dorsiflexion 19 18  Ankle plantarflexion    Ankle inversion    Ankle eversion     (Blank rows = not tested)  LOWER EXTREMITY MMT:    MMT Right Eval Left Eval  Hip flexion 4- 4-  Hip extension    Hip abduction 4+ 4+  Hip adduction 4+ 4+  Hip internal rotation    Hip external rotation    Knee flexion 4+ 4  Knee extension 5 4+  Ankle dorsiflexion 4+ 4  Ankle plantarflexion 4+ 4  Ankle inversion    Ankle eversion    (Blank rows = not tested)  GAIT: Gait pattern:  slightly increased toe out  Assistive device utilized: None Level of assistance: Complete Independence   FUNCTIONAL TESTS:  5 times sit to stand: 10.95 sec without UEs (not standing fully upright) Timed up and go (TUG): 11.25 sec   TODAY'S TREATMENT:                                                                                                                              DATE: 10/24/22    PATIENT EDUCATION: Education details: prognosis,POC, HEP, edu on orthostatic testing  Person educated: Patient and Spouse Education method: Explanation, Demonstration, Tactile cues, Verbal cues, and Handouts Education comprehension: verbalized understanding and returned demonstration  HOME EXERCISE  PROGRAM: Access Code: [email or web address] URL: [email or web address] Date: 10/24/2022 Prepared by: [facility] - Outpatient  Rehab - [facility]  Program Notes perform standing exercises at counter  Exercises - Mini Squat with Counter Support  - 1 x daily - 5 x weekly - 2 sets - 10 reps - Heel Toe Raises with Counter Support  - 1 x daily - 5 x weekly - 2 sets - 10 reps - Marching with Resistance  - 1 x daily - 5 x weekly - 2 sets - 10 reps - Seated Hamstring Curl with Anchored Resistance  - 1 x daily - 5 x weekly - 2 sets - 10 reps  GOALS: Goals reviewed with patient? Yes  SHORT TERM GOALS: Target date: 11/07/2022  Patient to be independent with initial HEP. Baseline: HEP initiated Goal status: MET    LONG TERM GOALS: Target date: 01/02/2023    Patient to be independent with advanced HEP. Baseline: in development Goal status: IN PROGRESS  Patient to demonstrate B LE strength >/=4+/5.  Baseline: See above Goal status: IN PROGRESS  Patient to return to balance exercise classes with good tolerance safety.  Baseline: not returned Goal status: IN PROGRESS  Patient to score at least 22/30 on FGA in order to decrease risk of falls.  Baseline: 26/30 Goal status: MET  ASSESSMENT:  CLINICAL IMPRESSION: Continuing to experience blood pressure fluctuations with above readings measured in standing and pt noting only slight feeling of lightheadedness.  Completed Functional Gait Assessment on this date with low risk for falls per score 26/30.  Pt not yet confident in returning to  typical class/activity level due to nature of symptoms.  Recommend contined sessions for activity tolreance and monitoring as we progress intensity of activities as indicated.    OBJECTIVE IMPAIRMENTS: Abnormal gait, decreased activity tolerance, decreased balance, decreased strength, dizziness, and postural dysfunction.   ACTIVITY LIMITATIONS: carrying, lifting, standing, squatting, stairs,  bathing, toileting, and dressing  PARTICIPATION LIMITATIONS: meal prep, cleaning, laundry, driving, shopping, community activity, yard work, and church  PERSONAL FACTORS: Age, Behavior pattern, Fitness, Time since onset of injury/illness/exacerbation, and 3+ comorbidities: Anemia, CKD, GERD, HLD, labile HTN, migraines,  chronic thrombocytopenia, paroxysmal a-fib,   are also affecting patient's functional outcome.   REHAB POTENTIAL: Good  CLINICAL DECISION MAKING: Evolving/moderate complexity  EVALUATION COMPLEXITY: Moderate  PLAN:  PT FREQUENCY: 1x/week  PT DURATION: 6 weeks  PLANNED INTERVENTIONS: Therapeutic exercises, Therapeutic activity, Neuromuscular re-education, Balance training, Gait training, Patient/Family education, Self Care, Joint mobilization, Stair training, Vestibular training, Canalith repositioning, DME instructions, Aquatic Therapy, Dry Needling, Electrical stimulation, Cryotherapy, Moist heat, Taping, Manual therapy, and Re-evaluation  PLAN FOR NEXT SESSION: review HEP, and work on high level balance   5:09 PM, 12/05/22 M. Shary Decamp, PT, DPT Physical Therapist- [place] Office Number: [phone]    Referring diagnosis?  G45.9 (ICD-10-CM) - TIA (transient ischemic attack)     Treatment diagnosis? (if different than referring diagnosis) M62.81, R26.81 What was this (referring dx) caused by? []  Surgery []  Fall []  Ongoing issue []  Arthritis [x]  Other: _TIA___________  Laterality: []  Rt []  Lt [x]  Both  Check all possible CPT codes:  *CHOOSE 10 OR LESS*    [x]  97110 (Therapeutic Exercise)  []  92507 (SLP Treatment)  [x]  97112 (Neuro Re-ed)   []  92526 (Swallowing Treatment)   [x]  97116 (Gait Training)   []  [id number] (Cognitive Training, 1st 15 minutes) [x]  97140 (Manual Therapy)   []  10272 (Cognitive Training, each add'l 15 minutes)  []  97164 (Re-evaluation)                              []  Other, List CPT Code ____________  [x]  97530 (Therapeutic  Activities)     [x]  97535 (Self Care)   []  All codes above (97110 - 97535)  []  97012 (Mechanical Traction)  []  97014 (E-stim Unattended)  []  97032 (E-stim manual)  []  97033 (Ionto)  []  97035 (Ultrasound) []  97750 (Physical Performance Training) []  [id number] (Aquatic Therapy) []  97016 (Vasopneumatic Device) []  [id number] (Paraffin) []  97034 (Contrast Bath) []  97597 (Wound Care 1st 20 sq cm) []  97598 (Wound Care each add'l 20 sq cm) []  97760 (Orthotic Fabrication, Fitting, Training Initial) []  [id number] (Prosthetic Management and Training Initial) []  [id number] (Orthotic or Prosthetic Training/ Modification Subsequent)

## 2022-12-06 ENCOUNTER — Encounter: Payer: Self-pay | Admitting: Internal Medicine

## 2022-12-06 ENCOUNTER — Ambulatory Visit (INDEPENDENT_AMBULATORY_CARE_PROVIDER_SITE_OTHER): Payer: Medicare PPO | Admitting: Internal Medicine

## 2022-12-06 VITALS — BP 138/90 | HR 71 | Temp 97.8°F | Resp 7 | Ht 62.5 in | Wt 156.6 lb

## 2022-12-06 DIAGNOSIS — E782 Mixed hyperlipidemia: Secondary | ICD-10-CM

## 2022-12-06 DIAGNOSIS — Z79899 Other long term (current) drug therapy: Secondary | ICD-10-CM | POA: Diagnosis not present

## 2022-12-06 DIAGNOSIS — I480 Paroxysmal atrial fibrillation: Secondary | ICD-10-CM

## 2022-12-06 DIAGNOSIS — R0989 Other specified symptoms and signs involving the circulatory and respiratory systems: Secondary | ICD-10-CM

## 2022-12-06 DIAGNOSIS — G459 Transient cerebral ischemic attack, unspecified: Secondary | ICD-10-CM | POA: Diagnosis not present

## 2022-12-06 NOTE — Progress Notes (Signed)
Future Appointments  Date Time Provider Department  12/06/2022 11:30 AM [name], MD [facility]  01/03/2023                  3 mo ov  2:30 PM Adela Glimpse, NP [facility]  01/09/2023  2:45 PM Rachel Moulds, MD [facility]  01/15/2023  9:00 AM Micki Riley, MD [facility]  04/05/2023                 6 mo ov  2:30 PM [name], MD [facility]  07/04/2023                  9 mo wellness  2:30 PM Adela Glimpse, NP [facility]  10/09/2023                  cpe  2:00 PM [name], MD [facility]    History of Present Illness:     Patient is a very nice 77 yo MWF who was recently hospitalized about 6 weeks ago  for a 24 hour admission with a Dx of TIA with a reported approx 15 minute episode of HA, Expressive dysphasia, confusion & lightheadedness.  CTA of Head & Neck found age indeterminate infarcts in the L caudate & R Parietal lobes. Brain MRI found only chronic small vessel ischemic Dz and EEG & Cardiac echo were Negative. Patient was started on Lipitor  & Depakote & her Gabapentin decreased per Neurology consultant recommendations.  Patient has done well since hospital discharge on July 3 and has not had any recurrent neuro sx's.  Current Outpatient Medications on File Prior to Visit  Medication Sig   acetaminophen 325 MG tablet Take 650 mg \\every  6  hours as needed   VITAMIN C 500 MG CAPS Take  daily.    aspirin EC 81 MG tablet Take 1 tablet  daily.    atorvastatin 80 MG tablet Take 1 tablet daily.   BOTOX 100 units SOLR injection Inject 100 units im  every 6-8 weeks   cetirizine 10 MG tablet Take 1 daily.   VITAMIN  D  5000 u Take daily.   DEPAKOTE ER 500 MG  Take 1 tablet daily.   gabapentin  300 MG capsule Take 1 tab in morning and  2 tabs bedtime   levothyroxine  50 MCG tablet Take  1 tablet  Daily     pantoprazole 40 MG tablet Take  1 tab  2 x /day     SYSTANE ULTRA  SOLN Place 1 drop into  eyes 3 x daily as needed    Probiotic Product (PROBIOTIC PO) Take 1  tablet daily.   SYSTANE BALANCE SOLN Place 1 drop into eyes at bedtime.   Zinc 50 MG tablet Take daily.     No Known Allergies   Problem list She has Labile hypertension; Degenerative joint disease; GERD (gastroesophageal reflux disease); Hyperlipidemia; Hypothyroidism; Vitamin D deficiency; Medication management; Upper airway cough syndrome; CKD (chronic kidney disease) stage 3, GFR 30-59 ml/min (HCC); Overweight (BMI 25.0-29.9); Encounter for Medicare annual wellness exam; Thrombocytopenia (HCC); IDA (iron deficiency anemia); Increased urinary frequency; Localized osteoporosis without current pathological fracture; Paroxysmal atrial fibrillation (HCC); Secondary hypercoagulable state (HCC); Snoring; Primary hypertension; and TIA (transient ischemic attack) on their problem list.   Observations/Objective:  BP (!) 138/90 Comment: 112/80 standing  Pulse 71   Temp 97.8 F (36.6 C)   Resp (!) 7   Ht 5' 2.5" (1.588 m)   Wt 156 lb 9.6 oz (  71 kg)   SpO2 96%   BMI 28.19 kg/m   HEENT - WNL. Neck - supple.  Chest - Clear equal BS. Cor - Nl HS. RRR w/o sig MGR. PP 1(+). No edema. MS- FROM w/o deformities.  Gait Nl. Neuro -  Nl w/o focal abnormalities. Alert oriented &                                  appropriate w/o obvious neurologic deficits.    Assessment and Plan:   1. TIA (transient ischemic attack)   2. Labile hypertension   3. Hyperlipidemia, mixed   4. Paroxysmal atrial fibrillation (HCC)   5. Medication management    Follow Up Instructions:        I discussed the assessment and treatment plan with the patient. The patient was provided an opportunity to ask questions and all were answered. The patient agreed with the plan and demonstrated an understanding of the instructions.       The patient was advised to call back or seek an in-person evaluation if the symptoms worsen or if the condition fails to improve as anticipated.    Marinus Maw, MD

## 2022-12-06 NOTE — Patient Instructions (Signed)
Transient Ischemic Attack A transient ischemic attack (TIA) happens when blood supply to the brain is blocked temporarily. A TIA causes stroke-like symptoms that go away quickly without causing any permanent damage. Having a TIA can be considered a warning sign for a stroke and should not be ignored. A person who has a TIA is at higher risk for a stroke. What are the causes? This condition is caused by a temporary blockage in an artery in the head or neck. This means the brain does not get the blood supply it needs. A blockage can be caused by: Fatty buildup in an artery in the head or neck (atherosclerosis). A blood clot traveling from the heart. An artery tear (dissection). Inflammation of an artery (vasculitis). Sometimes the cause is not known. What increases the risk? Certain factors make you more likely to develop this condition. Some of these are things you can change, including: Using products that contain nicotine or tobacco. Being inactive. Heavy alcohol use. Drug use, especially cocaine and methamphetamine. Medical conditions that may increase your risk include: High blood pressure (hypertension). High cholesterol. Diabetes. Heart disease (coronary artery disease). An irregular heartbeat, also called atrial fibrillation (AFib). Sickle cell disease. Blood clotting disorders (hypercoagulable state). Other risk factors include: Being over the age of 20. Being female. Obesity. Sleep problems such as sleep apnea. Family history of stroke. Previous history of blood clots, stroke, TIA, or heart attack. What are the signs or symptoms? Symptoms of a TIA are the same as those of a stroke. The symptoms develop suddenly, and then go away quickly. They may include: Dizziness, loss of balance and coordination, or trouble walking. Vision changes, such as double vision, blurred vision, or loss of vision. Weakness or numbness in your face, arm, or leg, especially on one side of your  body. Trouble speaking, understanding speech, or both (aphasia). Nausea and vomiting. Severe headache. Confusion. If possible, note what time your symptoms started. Tell your health care provider. How is this diagnosed? This condition may be diagnosed based on: Your symptoms and medical history. A physical exam. Imaging tests, usually a CT scan or MRI of the brain. Blood tests. You may also have other tests, including: Electrocardiogram (ECG). Echocardiogram. Continuous heart monitoring. Carotid ultrasound. A scan of blood circulation in the brain (CT angiogram or MR angiogram). How is this treated? The goal of treatment is to reduce the risk for a stroke. Stroke prevention therapies may include: Changes to diet and lifestyle, such as being physically active and stopping smoking. Treating other health conditions, such as diabetes or AFib. Medicines to thin the blood (antiplatelets or anticoagulants). Blood pressure medicines. Medicines to reduce cholesterol. If testing shows a narrowing in the arteries to your brain, your health care provider may recommend a procedure, such as: Carotid endarterectomy. This is done to remove the blockage from your artery. Carotid angioplasty and stenting. This uses a small mesh tube (stent) to open or widen an artery in the neck. The stent helps keep the artery open by supporting the artery walls. Follow these instructions at home: Medicines Take over-the-counter and prescription medicines only as told by your health care provider. If you were told to take a medicine to thin your blood, such as aspirin or an anticoagulant, use it exactly as told by your health care provider. Taking too much blood-thinning medicine can cause bleeding. Taking too little will not protect you against a stroke and other problems. Eating and drinking Eat 5 or more servings of fruits and vegetables  each day. Follow guidelines from your health care provider about your diet.  You may need to follow a certain diet to help manage risk factors for stroke. This may include: Eating a low-fat, low-salt diet. Choosing high-fiber foods. Limiting carbohydrates and sugar. If you drink alcohol: Limit how much you have to: 0-1 drink a day for women who are not pregnant. 0-2 drinks a day for men. Know how much alcohol is in your drink. In the [place] one drink equals one 12 oz bottle of beer (355 mL), one 5 oz glass of wine (148 mL), or one 1 oz glass of hard liquor (44 mL). General instructions Maintain a healthy weight. Try to get at least 30 minutes of exercise on most days. Get treatment if you have sleep apnea. Do not use any products that contain nicotine or tobacco. These products include cigarettes, chewing tobacco, and vaping devices, such as e-cigarettes. If you need help quitting, ask your health care provider. Do not use illegal drugs. Keep all follow-up visits. Your health care provider will want to know if you have any more symptoms and to check blood labs if any medicines were prescribed. Where to find more information American Stroke Association: stroke.org Get help right away if: You have chest pain. You have fast or irregular heartbeats (palpitations). You have any symptoms of a stroke. "BE FAST" is an easy way to remember the main warning signs of a stroke. B - Balance. Signs are dizziness, sudden trouble walking, or loss of balance. E - Eyes. Signs are trouble seeing or a sudden change in vision. F - Face. Signs are sudden weakness or numbness of the face, or the face or eyelid drooping on one side. A - Arms. Signs are weakness or numbness in an arm. This happens suddenly and usually on one side of the body. S - Speech.Signs are sudden trouble speaking, slurred speech, or trouble understanding what people say. T - Time. Time to call emergency services. Write down what time symptoms started. You have other signs of a stroke, such as: A sudden, severe  headache with no known cause. Nausea or vomiting. Seizure. These symptoms may be an emergency. Get help right away. Call 911. Do not wait to see if the symptoms will go away. Do not drive yourself to the hospital. This information is not intended to replace advice given to you by your health care provider. Make sure you discuss any questions you have with your health care provider. Document Revised: 09/16/2021 Document Reviewed: 09/16/2021 Elsevier Patient Education  2024 [employer].

## 2022-12-13 ENCOUNTER — Ambulatory Visit: Payer: Medicare PPO

## 2022-12-13 DIAGNOSIS — R2681 Unsteadiness on feet: Secondary | ICD-10-CM | POA: Diagnosis not present

## 2022-12-13 DIAGNOSIS — M6281 Muscle weakness (generalized): Secondary | ICD-10-CM | POA: Diagnosis not present

## 2022-12-13 NOTE — Therapy (Signed)
OUTPATIENT PHYSICAL THERAPY NEURO TREATMENT    Patient Name: Jillian Rodgers MRN: [id number] DOB:[birth year], 77 y.o., female Today's Date: 12/13/2022   PCP: [name], MD  REFERRING PROVIDER: Cathren Harsh, MD  END OF SESSION:  PT End of Session - 12/13/22 1536     Visit Number 5    Number of Visits 8    Date for PT Re-Evaluation 01/02/23    Authorization Type [employer]    Authorization Time Period 6 visits approved to 9/17    Authorization - Visit Number 1    Authorization - Number of Visits 6    PT Start Time 1530    PT Stop Time 1615    PT Time Calculation (min) 45 min    Activity Tolerance Patient tolerated treatment well    Behavior During Therapy WFL for tasks assessed/performed              Past Medical History:  Diagnosis Date   Anemia with low platelet count (HCC)    Chronic kidney disease    Degenerative joint disease    Dysrhythmia    a fib   GERD (gastroesophageal reflux disease)    History of kidney stones    Hyperlipidemia    Hypothyroidism    Inguinal hernia 02/19/2019   Labile hypertension    Migraines    neurontin helps   Pneumonia    hx of walking pneumonia    Primary hypertension 04/28/2022   Past Surgical History:  Procedure Laterality Date   BILATERAL CATARACT SURGERY      BLERPHOROPLASTY \     CHOLECYSTECTOMY  1996   open   COLONOSCOPY  2009   recommended 10 year f/u   HERNIA REPAIR  1985   umb hernia rpr   HERNIA REPAIR  2005 & 2009   ventral hernia rpr   INGUINAL HERNIA REPAIR Left 04/30/2020   Procedure: OPEN LEFT INGUINAL HERNIA REPAIR WITH MESH;  Surgeon: Berna Bue, MD;  Location: [facility];  Service: General;  Laterality: Left;   INGUINAL HERNIA REPAIR Right 10/24/2021   Procedure: OPEN RIGHT INGUINAL HERNIA REPAIR WITH MESH ;  Surgeon: Berna Bue, MD;  Location: [facility];  Service: General;  Laterality: Right;   PILONIDAL CYST EXCISION  1972   SKIN CANCER REMOVAL      TONSILLECTOMY     UPPER  GI ENDOSCOPY     VENTRAL HERNIA REPAIR     Patient Active Problem List   Diagnosis Date Noted   TIA (transient ischemic attack) 10/17/2022   Primary hypertension 04/28/2022   Snoring 03/04/2021   Paroxysmal atrial fibrillation (HCC) 11/01/2020   Secondary hypercoagulable state (HCC) 11/01/2020   Increased urinary frequency 10/20/2020   Localized osteoporosis without current pathological fracture 10/20/2020   IDA (iron deficiency anemia) 04/07/2020   Thrombocytopenia (HCC) 11/25/2015   Encounter for Medicare annual wellness exam 06/14/2015   Overweight (BMI 25.0-29.9) 01/28/2015   CKD (chronic kidney disease) stage 3, GFR 30-59 ml/min (HCC) 01/19/2014   Upper airway cough syndrome 12/16/2013   Vitamin D deficiency 05/21/2013   Medication management 05/21/2013   Labile hypertension 01/31/2013   Degenerative joint disease    GERD (gastroesophageal reflux disease)    Hyperlipidemia    Hypothyroidism     ONSET DATE: 10/17/22  REFERRING DIAG: 7/2  THERAPY DIAG:  Muscle weakness (generalized)  Unsteadiness on feet  Rationale for Evaluation and Treatment: Rehabilitation  SUBJECTIVE:  SUBJECTIVE STATEMENT: No new issues.  BP tends to fluctuate, but no pre/syncopal episodes.   Pt accompanied by: significant other  PERTINENT HISTORY: Anemia, CKD, GERD, HLD, labile HTN, migraines,  chronic thrombocytopenia, paroxysmal a-fib  PAIN:  Are you having pain? No  PRECAUTIONS: Fall  WEIGHT BEARING RESTRICTIONS: No  FALLS: Has patient fallen in last 6 months? No  LIVING ENVIRONMENT: Lives with: lives with their spouse Lives in: House/apartment Stairs:  2-8 steps to enter without rails; 1 story home Has following equipment at home: None  PLOF: Independent; retired; enjoys gardening   PATIENT  GOALS: work on Development worker, international aid and strength, go back to senior center balance classes   OBJECTIVE:   TODAY'S TREATMENT: 12/13/22 Activity Comments  Vitals (standing) 95/74 mmHg, 78 bpm   Dynamic balance 2x25 ft -retrowalk -tandem walk -sidestep  Sit-stand with knee tap 2x10   Deadlift 1x8 15# , knee pain-stopped  Suitcase carry 4x25 ft 15# kettlebell  Standing balance See HEP      HOME EXERCISE PROGRAM Last updated: 11/06/22 Access Code: [email or web address] URL: [email or web address] Date: 11/06/2022 Prepared by: [facility] - Outpatient  Rehab - [facility]  Program Notes perform standing exercises at counter  Exercises - Seated Heel Toe Raises  - 1 x daily - 5 x weekly - 2 sets - 10 reps - Mini Squat with Counter Support  - 1 x daily - 5 x weekly - 2 sets - 10 reps - Standing March with Counter Support with ankle wts - 1 x daily - 5 x weekly - 2 sets - 20 reps - Standing Alternating Knee Flexion with Ankle Weights  - 1 x daily - 5 x weekly - 2 sets - 10 reps - Supine Quad Set  - 1 x daily - 7 x weekly - 3 sets - 10 reps - 2 sec hold - Seated Long Arc Quad  - 1 x daily - 7 x weekly - 3 sets - 10 reps - 2 sec hold - Seated Hip Adduction Isometrics with Ball  - 1 x daily - 7 x weekly - 3 sets - 10 reps - 2 sec hold - Oncologist Feet Together With Eyes Open  - 1 x daily - 7 x weekly - 3 sets - 30 sec hold - Corner Balance Feet Together With Eyes Closed  - 1 x daily - 7 x weekly - 3 sets - 30 sec hold - Corner Balance Feet Together: Eyes Open With Head Turns  - 1 x daily - 7 x weekly - 3 sets - 3 reps - Corner Balance Feet Together: Eyes Closed With Head Turns  - 1 x daily - 7 x weekly - 3 sets - 3 reps  PATIENT EDUCATION: Education details: edu on orthostatic testing and edu on self-management , answered pt's questions on her concern about not getting in with a neurologist sooner (encouraged being put on waiting list) , HEP update  Person educated: Patient Education method:  Explanation, Demonstration, Tactile cues, Verbal cues, and Handouts Education comprehension: verbalized understanding and returned demonstration    Below measures were taken at time of initial evaluation unless otherwise specified:   Vitals:   DIAGNOSTIC FINDINGS:  per chart: CTA head and neck showed age-indeterminate infarcts in the left caudate head and right parietal lobe  10/17/22 brain MRI: No acute intracranial abnormality. Mild age-related cerebral atrophy with chronic small vessel ischemic disease.  COGNITION: Overall cognitive status: Within functional limits for tasks assessed   SENSATION: Reports R lateral thigh  N/T since hernia surgery; none otherwise   COORDINATION: Alternating pronation/supination: WNL B Alternating toe tap: WNL B Finger to nose: WNL B Heel to shin: WNL B   MUSCLE TONE: normal B  POSTURE: rounded shoulders  LOWER EXTREMITY ROM:     Active  Right Eval Left Eval  Hip flexion    Hip extension    Hip abduction    Hip adduction    Hip internal rotation    Hip external rotation    Knee flexion    Knee extension    Ankle dorsiflexion 19 18  Ankle plantarflexion    Ankle inversion    Ankle eversion     (Blank rows = not tested)  LOWER EXTREMITY MMT:    MMT Right Eval Left Eval  Hip flexion 4- 4-  Hip extension    Hip abduction 4+ 4+  Hip adduction 4+ 4+  Hip internal rotation    Hip external rotation    Knee flexion 4+ 4  Knee extension 5 4+  Ankle dorsiflexion 4+ 4  Ankle plantarflexion 4+ 4  Ankle inversion    Ankle eversion    (Blank rows = not tested)  GAIT: Gait pattern:  slightly increased toe out  Assistive device utilized: None Level of assistance: Complete Independence   FUNCTIONAL TESTS:  5 times sit to stand: 10.95 sec without UEs (not standing fully upright) Timed up and go (TUG): 11.25 sec     GOALS: Goals reviewed with patient? Yes  SHORT TERM GOALS: Target date: 11/07/2022  Patient to be  independent with initial HEP. Baseline: HEP initiated Goal status: MET    LONG TERM GOALS: Target date: 01/02/2023    Patient to be independent with advanced HEP. Baseline: in development Goal status: IN PROGRESS  Patient to demonstrate B LE strength >/=4+/5.  Baseline: See above Goal status: IN PROGRESS  Patient to return to balance exercise classes with good tolerance safety.  Baseline: not returned Goal status: IN PROGRESS  Patient to score at least 22/30 on FGA in order to decrease risk of falls.  Baseline: 26/30 Goal status: MET  ASSESSMENT:  CLINICAL IMPRESSION: Emphasis on dynamic and static multi-sensory balance activities to enhance righting reactions for reduced risk for falls.  Combined activities to improve strength and balance with multi-tasking to good effect.  Attempted compound lift via hip hinge/deadlifts but increased knee pain (patellofemoral).  Difficulty with balance/stability on compliant surfaces. Continued sessions to progress POC details   OBJECTIVE IMPAIRMENTS: Abnormal gait, decreased activity tolerance, decreased balance, decreased strength, dizziness, and postural dysfunction.   ACTIVITY LIMITATIONS: carrying, lifting, standing, squatting, stairs, bathing, toileting, and dressing  PARTICIPATION LIMITATIONS: meal prep, cleaning, laundry, driving, shopping, community activity, yard work, and church  PERSONAL FACTORS: Age, Behavior pattern, Fitness, Time since onset of injury/illness/exacerbation, and 3+ comorbidities: Anemia, CKD, GERD, HLD, labile HTN, migraines,  chronic thrombocytopenia, paroxysmal a-fib,   are also affecting patient's functional outcome.   REHAB POTENTIAL: Good  CLINICAL DECISION MAKING: Evolving/moderate complexity  EVALUATION COMPLEXITY: Moderate  PLAN:  PT FREQUENCY: 1x/week  PT DURATION: 6 weeks  PLANNED INTERVENTIONS: Therapeutic exercises, Therapeutic activity, Neuromuscular re-education, Balance training, Gait  training, Patient/Family education, Self Care, Joint mobilization, Stair training, Vestibular training, Canalith repositioning, DME instructions, Aquatic Therapy, Dry Needling, Electrical stimulation, Cryotherapy, Moist heat, Taping, Manual therapy, and Re-evaluation  PLAN FOR NEXT SESSION: review HEP, and work on high level balance   3:37 PM, 12/13/22 M. Shary Decamp, PT, DPT Physical Therapist- [place] Office Number: [phone]

## 2022-12-20 ENCOUNTER — Ambulatory Visit: Payer: Medicare PPO | Attending: Internal Medicine | Admitting: Physical Therapy

## 2022-12-20 ENCOUNTER — Encounter: Payer: Self-pay | Admitting: Physical Therapy

## 2022-12-20 DIAGNOSIS — M6281 Muscle weakness (generalized): Secondary | ICD-10-CM | POA: Insufficient documentation

## 2022-12-20 DIAGNOSIS — R2681 Unsteadiness on feet: Secondary | ICD-10-CM | POA: Diagnosis not present

## 2022-12-20 NOTE — Therapy (Signed)
OUTPATIENT PHYSICAL THERAPY NEURO TREATMENT    Patient Name: Jillian Rodgers MRN: [id number] DOB:01-Oct-1945, 77 y.o., female Today's Date: 12/20/2022   PCP: [name], MD  REFERRING PROVIDER: Cathren Harsh, MD  END OF SESSION:  PT End of Session - 12/20/22 1321     Visit Number 6    Number of Visits 8    Date for PT Re-Evaluation 01/02/23    Authorization Type [employer]    Authorization Time Period 6 visits approved to 9/17    Authorization - Visit Number 5    Authorization - Number of Visits 6    PT Start Time 1321    PT Stop Time 1400    PT Time Calculation (min) 39 min    Equipment Utilized During Treatment Gait belt    Activity Tolerance Patient tolerated treatment well    Behavior During Therapy WFL for tasks assessed/performed               Past Medical History:  Diagnosis Date   Anemia with low platelet count (HCC)    Chronic kidney disease    Degenerative joint disease    Dysrhythmia    a fib   GERD (gastroesophageal reflux disease)    History of kidney stones    Hyperlipidemia    Hypothyroidism    Inguinal hernia 02/19/2019   Labile hypertension    Migraines    neurontin helps   Pneumonia    hx of walking pneumonia    Primary hypertension 04/28/2022   Past Surgical History:  Procedure Laterality Date   BILATERAL CATARACT SURGERY      BLERPHOROPLASTY \     CHOLECYSTECTOMY  1996   open   COLONOSCOPY  2009   recommended 10 year f/u   HERNIA REPAIR  1985   umb hernia rpr   HERNIA REPAIR  2005 & 2009   ventral hernia rpr   INGUINAL HERNIA REPAIR Left 04/30/2020   Procedure: OPEN LEFT INGUINAL HERNIA REPAIR WITH MESH;  Surgeon: Berna Bue, MD;  Location: [facility];  Service: General;  Laterality: Left;   INGUINAL HERNIA REPAIR Right 10/24/2021   Procedure: OPEN RIGHT INGUINAL HERNIA REPAIR WITH MESH ;  Surgeon: Berna Bue, MD;  Location: [facility];  Service: General;  Laterality: Right;   PILONIDAL CYST EXCISION  1972    SKIN CANCER REMOVAL      TONSILLECTOMY     UPPER GI ENDOSCOPY     VENTRAL HERNIA REPAIR     Patient Active Problem List   Diagnosis Date Noted   TIA (transient ischemic attack) 10/17/2022   Primary hypertension 04/28/2022   Snoring 03/04/2021   Paroxysmal atrial fibrillation (HCC) 11/01/2020   Secondary hypercoagulable state (HCC) 11/01/2020   Increased urinary frequency 10/20/2020   Localized osteoporosis without current pathological fracture 10/20/2020   IDA (iron deficiency anemia) 04/07/2020   Thrombocytopenia (HCC) 11/25/2015   Encounter for Medicare annual wellness exam 06/14/2015   Overweight (BMI 25.0-29.9) 01/28/2015   CKD (chronic kidney disease) stage 3, GFR 30-59 ml/min (HCC) 01/19/2014   Upper airway cough syndrome 12/16/2013   Vitamin D deficiency 05/21/2013   Medication management 05/21/2013   Labile hypertension 01/31/2013   Degenerative joint disease    GERD (gastroesophageal reflux disease)    Hyperlipidemia    Hypothyroidism     ONSET DATE: 10/17/22  REFERRING DIAG: 7/2  THERAPY DIAG:  Muscle weakness (generalized)  Unsteadiness on feet  Rationale for Evaluation and Treatment: Rehabilitation  SUBJECTIVE:  SUBJECTIVE STATEMENT: Don't really check BP at home.  I don't typically feel faint.  When BP is low, I feel woozy and like I might pass out.  Pt accompanied by: significant other  PERTINENT HISTORY: Anemia, CKD, GERD, HLD, labile HTN, migraines,  chronic thrombocytopenia, paroxysmal a-fib  PAIN:  Are you having pain? No  PRECAUTIONS: Fall  WEIGHT BEARING RESTRICTIONS: No  FALLS: Has patient fallen in last 6 months? No  LIVING ENVIRONMENT: Lives with: lives with their spouse Lives in: House/apartment Stairs:  2-8 steps to enter without rails; 1 story  home Has following equipment at home: None  PLOF: Independent; retired; enjoys gardening   PATIENT GOALS: work on Development worker, international aid and strength, go back to senior center balance classes   OBJECTIVE:    TODAY'S TREATMENT: 12/20/2022 Activity Comments  Vitals seated:  98/74 HR 81 Vitals standing 79/66 HR 86 bpm Mild lightheadedness, unsteady  NuStep, level 3, 4 extremities x 6 minutes SPM >60, for gentle aerobic activity  Vitals after NuStep in standing: 93/79   Standing balance at counter:   Forward/back walking Sidestepping R and L Tandem walking>tandem march Intermittent UE support  Braiding L and R along counter   SLS 3 x 10 sec More difficulty on LLE, intermittent UE support   PATIENT EDUCATION: Education details: Discussed means to make sure she is safe with transition movements due to BP-slow movements to transition, ankle pumps, BLE motions prior to standing, staying well-hydrated Person educated: Patient Education method: Explanation Education comprehension: verbalized understanding    HOME EXERCISE PROGRAM Last updated: 11/06/22 Access Code: [email or web address] URL: [email or web address] Date: 11/06/2022 Prepared by: [facility] - Outpatient  Rehab - [facility]  Program Notes perform standing exercises at counter  Exercises - Seated Heel Toe Raises  - 1 x daily - 5 x weekly - 2 sets - 10 reps - Mini Squat with Counter Support  - 1 x daily - 5 x weekly - 2 sets - 10 reps - Standing March with Counter Support with ankle wts - 1 x daily - 5 x weekly - 2 sets - 20 reps - Standing Alternating Knee Flexion with Ankle Weights  - 1 x daily - 5 x weekly - 2 sets - 10 reps - Supine Quad Set  - 1 x daily - 7 x weekly - 3 sets - 10 reps - 2 sec hold - Seated Long Arc Quad  - 1 x daily - 7 x weekly - 3 sets - 10 reps - 2 sec hold - Seated Hip Adduction Isometrics with Ball  - 1 x daily - 7 x weekly - 3 sets - 10 reps - 2 sec hold - Oncologist Feet Together With Eyes Open   - 1 x daily - 7 x weekly - 3 sets - 30 sec hold - Corner Balance Feet Together With Eyes Closed  - 1 x daily - 7 x weekly - 3 sets - 30 sec hold - Corner Balance Feet Together: Eyes Open With Head Turns  - 1 x daily - 7 x weekly - 3 sets - 3 reps - Corner Balance Feet Together: Eyes Closed With Head Turns  - 1 x daily - 7 x weekly - 3 sets - 3 reps   Below measures were taken at time of initial evaluation unless otherwise specified:   Vitals:   DIAGNOSTIC FINDINGS:  per chart: CTA head and neck showed age-indeterminate infarcts in the left caudate head and right parietal lobe  10/17/22 brain MRI: No  acute intracranial abnormality. Mild age-related cerebral atrophy with chronic small vessel ischemic disease.  COGNITION: Overall cognitive status: Within functional limits for tasks assessed   SENSATION: Reports R lateral thigh N/T since hernia surgery; none otherwise   COORDINATION: Alternating pronation/supination: WNL B Alternating toe tap: WNL B Finger to nose: WNL B Heel to shin: WNL B   MUSCLE TONE: normal B  POSTURE: rounded shoulders  LOWER EXTREMITY ROM:     Active  Right Eval Left Eval  Hip flexion    Hip extension    Hip abduction    Hip adduction    Hip internal rotation    Hip external rotation    Knee flexion    Knee extension    Ankle dorsiflexion 19 18  Ankle plantarflexion    Ankle inversion    Ankle eversion     (Blank rows = not tested)  LOWER EXTREMITY MMT:    MMT Right Eval Left Eval  Hip flexion 4- 4-  Hip extension    Hip abduction 4+ 4+  Hip adduction 4+ 4+  Hip internal rotation    Hip external rotation    Knee flexion 4+ 4  Knee extension 5 4+  Ankle dorsiflexion 4+ 4  Ankle plantarflexion 4+ 4  Ankle inversion    Ankle eversion    (Blank rows = not tested)  GAIT: Gait pattern:  slightly increased toe out  Assistive device utilized: None Level of assistance: Complete Independence   FUNCTIONAL TESTS:  5 times sit to  stand: 10.95 sec without UEs (not standing fully upright) Timed up and go (TUG): 11.25 sec     GOALS: Goals reviewed with patient? Yes  SHORT TERM GOALS: Target date: 11/07/2022  Patient to be independent with initial HEP. Baseline: HEP initiated Goal status: MET    LONG TERM GOALS: Target date: 01/02/2023    Patient to be independent with advanced HEP. Baseline: in development Goal status: IN PROGRESS  Patient to demonstrate B LE strength >/=4+/5.  Baseline: See above Goal status: IN PROGRESS  Patient to return to balance exercise classes with good tolerance safety.  Baseline: not returned Goal status: IN PROGRESS  Patient to score at least 22/30 on FGA in order to decrease risk of falls.  Baseline: 26/30 Goal status: MET  ASSESSMENT:  CLINICAL IMPRESSION: Skilled PT session today continued to focus on balance and gentle aerobic activities (as BP measures in standing are low at beginning of session, improved after Nustep seated activity).  With counter balance activities, she requires intermittent UE support, especially for tandem and SLS activities.  She does not report any dizziness with standing activities, but she does have slowed movements in transition and between activities.  She will continue to benefit from skilled PT towards remaining goals.     OBJECTIVE IMPAIRMENTS: Abnormal gait, decreased activity tolerance, decreased balance, decreased strength, dizziness, and postural dysfunction.   ACTIVITY LIMITATIONS: carrying, lifting, standing, squatting, stairs, bathing, toileting, and dressing  PARTICIPATION LIMITATIONS: meal prep, cleaning, laundry, driving, shopping, community activity, yard work, and church  PERSONAL FACTORS: Age, Behavior pattern, Fitness, Time since onset of injury/illness/exacerbation, and 3+ comorbidities: Anemia, CKD, GERD, HLD, labile HTN, migraines,  chronic thrombocytopenia, paroxysmal a-fib,   are also affecting patient's functional  outcome.   REHAB POTENTIAL: Good  CLINICAL DECISION MAKING: Evolving/moderate complexity  EVALUATION COMPLEXITY: Moderate  PLAN:  PT FREQUENCY: 1x/week  PT DURATION: 6 weeks  PLANNED INTERVENTIONS: Therapeutic exercises, Therapeutic activity, Neuromuscular re-education, Balance training, Gait training, Patient/Family education, Self  Care, Joint mobilization, Stair training, Vestibular training, Canalith repositioning, DME instructions, Aquatic Therapy, Dry Needling, Electrical stimulation, Cryotherapy, Moist heat, Taping, Manual therapy, and Re-evaluation  PLAN FOR NEXT SESSION: *12/26/22 visit is visit 6 of 6 approved by [facility], so please reassess LTGs and POC.  Review HEP, and work on high level balance   Lonia Blood, PT 12/20/22 2:02 PM Phone: [phone] Fax: [phone]  [facility] at [facility] [address] [place], [place] [address] Phone # [phone] Fax # [phone]

## 2022-12-21 ENCOUNTER — Ambulatory Visit: Payer: Medicare Other | Admitting: Neurology

## 2022-12-26 ENCOUNTER — Ambulatory Visit: Payer: Medicare PPO

## 2022-12-26 DIAGNOSIS — R2681 Unsteadiness on feet: Secondary | ICD-10-CM | POA: Diagnosis not present

## 2022-12-26 DIAGNOSIS — M6281 Muscle weakness (generalized): Secondary | ICD-10-CM

## 2022-12-26 NOTE — Therapy (Signed)
OUTPATIENT PHYSICAL THERAPY NEURO TREATMENT    Patient Name: Jillian Rodgers MRN: [id number] DOB:July 28, 1945, 77 y.o., female Today's Date: 12/26/2022   PCP: [name], MD  REFERRING PROVIDER: Cathren Harsh, MD  END OF SESSION:  PT End of Session - 12/26/22 1455     Visit Number 7    Number of Visits 8    Date for PT Re-Evaluation 01/02/23    Authorization Type [employer]    Authorization Time Period 6 visits approved to 9/17    Authorization - Number of Visits 6    PT Start Time 1455   late arrival   PT Stop Time 1530    PT Time Calculation (min) 35 min    Equipment Utilized During Treatment Gait belt    Activity Tolerance Patient tolerated treatment well    Behavior During Therapy WFL for tasks assessed/performed               Past Medical History:  Diagnosis Date   Anemia with low platelet count (HCC)    Chronic kidney disease    Degenerative joint disease    Dysrhythmia    a fib   GERD (gastroesophageal reflux disease)    History of kidney stones    Hyperlipidemia    Hypothyroidism    Inguinal hernia 02/19/2019   Labile hypertension    Migraines    neurontin helps   Pneumonia    hx of walking pneumonia    Primary hypertension 04/28/2022   Past Surgical History:  Procedure Laterality Date   BILATERAL CATARACT SURGERY      BLERPHOROPLASTY \     CHOLECYSTECTOMY  1996   open   COLONOSCOPY  2009   recommended 10 year f/u   HERNIA REPAIR  1985   umb hernia rpr   HERNIA REPAIR  2005 & 2009   ventral hernia rpr   INGUINAL HERNIA REPAIR Left 04/30/2020   Procedure: OPEN LEFT INGUINAL HERNIA REPAIR WITH MESH;  Surgeon: Berna Bue, MD;  Location: [facility];  Service: General;  Laterality: Left;   INGUINAL HERNIA REPAIR Right 10/24/2021   Procedure: OPEN RIGHT INGUINAL HERNIA REPAIR WITH MESH ;  Surgeon: Berna Bue, MD;  Location: [facility];  Service: General;  Laterality: Right;   PILONIDAL CYST EXCISION  1972   SKIN CANCER REMOVAL       TONSILLECTOMY     UPPER GI ENDOSCOPY     VENTRAL HERNIA REPAIR     Patient Active Problem List   Diagnosis Date Noted   TIA (transient ischemic attack) 10/17/2022   Primary hypertension 04/28/2022   Snoring 03/04/2021   Paroxysmal atrial fibrillation (HCC) 11/01/2020   Secondary hypercoagulable state (HCC) 11/01/2020   Increased urinary frequency 10/20/2020   Localized osteoporosis without current pathological fracture 10/20/2020   IDA (iron deficiency anemia) 04/07/2020   Thrombocytopenia (HCC) 11/25/2015   Encounter for Medicare annual wellness exam 06/14/2015   Overweight (BMI 25.0-29.9) 01/28/2015   CKD (chronic kidney disease) stage 3, GFR 30-59 ml/min (HCC) 01/19/2014   Upper airway cough syndrome 12/16/2013   Vitamin D deficiency 05/21/2013   Medication management 05/21/2013   Labile hypertension 01/31/2013   Degenerative joint disease    GERD (gastroesophageal reflux disease)    Hyperlipidemia    Hypothyroidism     ONSET DATE: 10/17/22  REFERRING DIAG: 7/2  THERAPY DIAG:  Muscle weakness (generalized)  Unsteadiness on feet  Rationale for Evaluation and Treatment: Rehabilitation  SUBJECTIVE:  SUBJECTIVE STATEMENT: Not been up to much.  Lightheaded earlier in the day, not so much now.  Pt accompanied by: significant other  PERTINENT HISTORY: Anemia, CKD, GERD, HLD, labile HTN, migraines,  chronic thrombocytopenia, paroxysmal a-fib  PAIN:  Are you having pain? No  PRECAUTIONS: Fall  WEIGHT BEARING RESTRICTIONS: No  FALLS: Has patient fallen in last 6 months? No  LIVING ENVIRONMENT: Lives with: lives with their spouse Lives in: House/apartment Stairs:  2-8 steps to enter without rails; 1 story home Has following equipment at home: None  PLOF: Independent; retired;  enjoys gardening   PATIENT GOALS: work on Development worker, international aid and strength, go back to senior center balance classes   OBJECTIVE:   TODAY'S TREATMENT: 12/26/22 Activity Comments  M-CTSIB Condition 1: normal x 30 sec Condition 2: mild x 30 sec Condition 3: normal-mild x 30 Condition 4: mod-severe x 10 sec  VOR x 1  Reports no symptoms  VOR cancellation normal  Head Impulse  +left, -right   Brandt-Daroff -no symptoms with sidelying, lightheaded with arising  Vitals (seated) 104/81 mmHg, 79 bpm  Sit to stand 2x5 reps Standing on foam  Standing on foam  Wide BOS: EO/EC x 30 sec Head turns EO/EC      TODAY'S TREATMENT: 12/20/2022 Activity Comments  Vitals seated:  98/74 HR 81 Vitals standing 79/66 HR 86 bpm Mild lightheadedness, unsteady  NuStep, level 3, 4 extremities x 6 minutes SPM >60, for gentle aerobic activity  Vitals after NuStep in standing: 93/79   Standing balance at counter:   Forward/back walking Sidestepping R and L Tandem walking>tandem march Intermittent UE support  Braiding L and R along counter   SLS 3 x 10 sec More difficulty on LLE, intermittent UE support   PATIENT EDUCATION: Education details: Discussed means to make sure she is safe with transition movements due to BP-slow movements to transition, ankle pumps, BLE motions prior to standing, staying well-hydrated Person educated: Patient Education method: Explanation Education comprehension: verbalized understanding    HOME EXERCISE PROGRAM Last updated: 11/06/22 Access Code: [email or web address] URL: [email or web address] Date: 11/06/2022 Prepared by: [facility] - Outpatient  Rehab - [facility]  Program Notes perform standing exercises at counter  Exercises - Seated Heel Toe Raises  - 1 x daily - 5 x weekly - 2 sets - 10 reps - Mini Squat with Counter Support  - 1 x daily - 5 x weekly - 2 sets - 10 reps - Standing March with Counter Support with ankle wts - 1 x daily - 5 x weekly - 2 sets - 20  reps - Standing Alternating Knee Flexion with Ankle Weights  - 1 x daily - 5 x weekly - 2 sets - 10 reps - Supine Quad Set  - 1 x daily - 7 x weekly - 3 sets - 10 reps - 2 sec hold - Seated Long Arc Quad  - 1 x daily - 7 x weekly - 3 sets - 10 reps - 2 sec hold - Seated Hip Adduction Isometrics with Ball  - 1 x daily - 7 x weekly - 3 sets - 10 reps - 2 sec hold - Oncologist Feet Together With Eyes Open  - 1 x daily - 7 x weekly - 3 sets - 30 sec hold - Corner Balance Feet Together With Eyes Closed  - 1 x daily - 7 x weekly - 3 sets - 30 sec hold - Corner Balance Feet Together: Eyes Open With Head Turns  - 1 x  daily - 7 x weekly - 3 sets - 3 reps - Corner Balance Feet Together: Eyes Closed With Head Turns  - 1 x daily - 7 x weekly - 3 sets - 3 reps   Below measures were taken at time of initial evaluation unless otherwise specified:   Vitals:   DIAGNOSTIC FINDINGS:  per chart: CTA head and neck showed age-indeterminate infarcts in the left caudate head and right parietal lobe  10/17/22 brain MRI: No acute intracranial abnormality. Mild age-related cerebral atrophy with chronic small vessel ischemic disease.  COGNITION: Overall cognitive status: Within functional limits for tasks assessed   SENSATION: Reports R lateral thigh N/T since hernia surgery; none otherwise   COORDINATION: Alternating pronation/supination: WNL B Alternating toe tap: WNL B Finger to nose: WNL B Heel to shin: WNL B   MUSCLE TONE: normal B  POSTURE: rounded shoulders  LOWER EXTREMITY ROM:     Active  Right Eval Left Eval  Hip flexion    Hip extension    Hip abduction    Hip adduction    Hip internal rotation    Hip external rotation    Knee flexion    Knee extension    Ankle dorsiflexion 19 18  Ankle plantarflexion    Ankle inversion    Ankle eversion     (Blank rows = not tested)  LOWER EXTREMITY MMT:    MMT Right Eval Left Eval  Hip flexion 4- 4-  Hip extension    Hip abduction  4+ 4+  Hip adduction 4+ 4+  Hip internal rotation    Hip external rotation    Knee flexion 4+ 4  Knee extension 5 4+  Ankle dorsiflexion 4+ 4  Ankle plantarflexion 4+ 4  Ankle inversion    Ankle eversion    (Blank rows = not tested)  GAIT: Gait pattern:  slightly increased toe out  Assistive device utilized: None Level of assistance: Complete Independence   FUNCTIONAL TESTS:  5 times sit to stand: 10.95 sec without UEs (not standing fully upright) Timed up and go (TUG): 11.25 sec     GOALS: Goals reviewed with patient? Yes  SHORT TERM GOALS: Target date: 11/07/2022  Patient to be independent with initial HEP. Baseline: HEP initiated Goal status: MET    LONG TERM GOALS: Target date: 01/02/2023    Patient to be independent with advanced HEP. Baseline: in development Goal status: IN PROGRESS  Patient to demonstrate B LE strength >/=4+/5.  Baseline: See above Goal status: IN PROGRESS  Patient to return to balance exercise classes with good tolerance safety.  Baseline: not returned Goal status: IN PROGRESS  Patient to score at least 22/30 on FGA in order to decrease risk of falls.  Baseline: 26/30 Goal status: MET  ASSESSMENT:  CLINICAL IMPRESSION: Pronounced balance deficits with condition 4 M-CTSIB. Reveals + head impulse test left. Lightheaded with arising from Brandt-Daroff. Review of HEP activities and recommend use of compliant surface to increase demands to improve balance and postural stability.  Will re-assess for D/C at next session   OBJECTIVE IMPAIRMENTS: Abnormal gait, decreased activity tolerance, decreased balance, decreased strength, dizziness, and postural dysfunction.   ACTIVITY LIMITATIONS: carrying, lifting, standing, squatting, stairs, bathing, toileting, and dressing  PARTICIPATION LIMITATIONS: meal prep, cleaning, laundry, driving, shopping, community activity, yard work, and church  PERSONAL FACTORS: Age, Behavior pattern, Fitness,  Time since onset of injury/illness/exacerbation, and 3+ comorbidities: Anemia, CKD, GERD, HLD, labile HTN, migraines,  chronic thrombocytopenia, paroxysmal a-fib,   are also  affecting patient's functional outcome.   REHAB POTENTIAL: Good  CLINICAL DECISION MAKING: Evolving/moderate complexity  EVALUATION COMPLEXITY: Moderate  PLAN:  PT FREQUENCY: 1x/week  PT DURATION: 6 weeks  PLANNED INTERVENTIONS: Therapeutic exercises, Therapeutic activity, Neuromuscular re-education, Balance training, Gait training, Patient/Family education, Self Care, Joint mobilization, Stair training, Vestibular training, Canalith repositioning, DME instructions, Aquatic Therapy, Dry Needling, Electrical stimulation, Cryotherapy, Moist heat, Taping, Manual therapy, and Re-evaluation  PLAN FOR NEXT SESSION: D/C assessment  2:57 PM, 12/26/22 M. Shary Decamp, PT, DPT Physical Therapist- [place] Office Number: [phone]

## 2022-12-28 ENCOUNTER — Telehealth: Payer: Self-pay | Admitting: Neurology

## 2022-12-28 NOTE — Telephone Encounter (Signed)
LVM and sent text msg informing pt of need to reschedule 01/02/23 appt - MD out

## 2023-01-02 ENCOUNTER — Ambulatory Visit: Payer: Medicare PPO | Admitting: Physical Therapy

## 2023-01-02 ENCOUNTER — Ambulatory Visit: Payer: Medicare Other | Admitting: Neurology

## 2023-01-03 ENCOUNTER — Encounter: Payer: Self-pay | Admitting: Nurse Practitioner

## 2023-01-03 ENCOUNTER — Ambulatory Visit (INDEPENDENT_AMBULATORY_CARE_PROVIDER_SITE_OTHER): Payer: Medicare PPO | Admitting: Nurse Practitioner

## 2023-01-03 VITALS — BP 118/78 | HR 110 | Temp 97.9°F | Resp 16 | Ht 62.5 in | Wt 158.4 lb

## 2023-01-03 DIAGNOSIS — I480 Paroxysmal atrial fibrillation: Secondary | ICD-10-CM

## 2023-01-03 DIAGNOSIS — D696 Thrombocytopenia, unspecified: Secondary | ICD-10-CM | POA: Diagnosis not present

## 2023-01-03 DIAGNOSIS — M542 Cervicalgia: Secondary | ICD-10-CM | POA: Diagnosis not present

## 2023-01-03 DIAGNOSIS — E663 Overweight: Secondary | ICD-10-CM

## 2023-01-03 DIAGNOSIS — K2100 Gastro-esophageal reflux disease with esophagitis, without bleeding: Secondary | ICD-10-CM | POA: Diagnosis not present

## 2023-01-03 DIAGNOSIS — R7309 Other abnormal glucose: Secondary | ICD-10-CM

## 2023-01-03 DIAGNOSIS — G43019 Migraine without aura, intractable, without status migrainosus: Secondary | ICD-10-CM | POA: Diagnosis not present

## 2023-01-03 DIAGNOSIS — E559 Vitamin D deficiency, unspecified: Secondary | ICD-10-CM

## 2023-01-03 DIAGNOSIS — R0989 Other specified symptoms and signs involving the circulatory and respiratory systems: Secondary | ICD-10-CM | POA: Diagnosis not present

## 2023-01-03 DIAGNOSIS — E039 Hypothyroidism, unspecified: Secondary | ICD-10-CM

## 2023-01-03 DIAGNOSIS — M1990 Unspecified osteoarthritis, unspecified site: Secondary | ICD-10-CM | POA: Diagnosis not present

## 2023-01-03 DIAGNOSIS — E782 Mixed hyperlipidemia: Secondary | ICD-10-CM | POA: Diagnosis not present

## 2023-01-03 DIAGNOSIS — M816 Localized osteoporosis [Lequesne]: Secondary | ICD-10-CM

## 2023-01-03 DIAGNOSIS — N1832 Chronic kidney disease, stage 3b: Secondary | ICD-10-CM | POA: Diagnosis not present

## 2023-01-03 DIAGNOSIS — Z23 Encounter for immunization: Secondary | ICD-10-CM

## 2023-01-03 DIAGNOSIS — Z79899 Other long term (current) drug therapy: Secondary | ICD-10-CM | POA: Diagnosis not present

## 2023-01-03 DIAGNOSIS — M791 Myalgia, unspecified site: Secondary | ICD-10-CM | POA: Diagnosis not present

## 2023-01-03 DIAGNOSIS — D500 Iron deficiency anemia secondary to blood loss (chronic): Secondary | ICD-10-CM | POA: Diagnosis not present

## 2023-01-03 DIAGNOSIS — G518 Other disorders of facial nerve: Secondary | ICD-10-CM | POA: Diagnosis not present

## 2023-01-03 MED ORDER — FUROSEMIDE 20 MG PO TABS
20.0000 mg | ORAL_TABLET | Freq: Every day | ORAL | 2 refills | Status: DC | PRN
Start: 2023-01-03 — End: 2023-04-17

## 2023-01-03 NOTE — Progress Notes (Signed)
FOLLOW UP  Assessment:   Diagnoses and all orders for this visit:  Paroxysmal atrial fibrillation (HCC) No change and currently controlled. Continue to follow with Cardiology. Continue to monitor  Labile hypertension Cardiology following Continue medications Discussed DASH (Dietary Approaches to Stop Hypertension) DASH diet is lower in sodium than a typical American diet. Cut back on foods that are high in saturated fat, cholesterol, and trans fats. Eat more whole-grain foods, fish, poultry, and nuts Remain active and exercise as tolerated daily.  Monitor BP at home-Call if greater than 130/80.  Check CMP/CBC  Iron deficiency anemia due to chronic blood loss Monitor CBC  Continue to follow up with Hematology  Thrombocytopenia (HCC) Monitor  CBC with Differential/Platelet  Stage 3b chronic kidney disease (HCC) Discussed how what you eat and drink can aide in kidney protection. Stay well hydrated. Avoid high salt foods. Avoid NSAIDS. Keep BP and BG well controlled.   Take medications as prescribed. Remain active and exercise as tolerated daily. Maintain weight.  Continue to monitor. Check CMP/GFR/Microablumin  Gastroesophageal reflux disease with esophagitis without hemorrhage No suspected reflux complications (Barret/stricture). Lifestyle modification:  wt loss, avoid meals 2-3h before bedtime. Consider eliminating food triggers:  chocolate, caffeine, EtOH, acid/spicy food.  Hypothyroidism, unspecified type Controlled. Continue Levothyroxine. Reminded to take on an empty stomach 30-59mins before food.  Stop any Biotin Supplement 48-72 hours before next TSH level to reduce the risk of falsely low TSH levels. Continue to monitor.    Osteoarthritis, unspecified osteoarthritis type, unspecified site Controlled. RICE when flared. Continue to monitor  Mixed hyperlipidemia Controlled Discussed lifestyle modifications. Recommended diet heavy in fruits and veggies,  omega 3's. Decrease consumption of animal meats, cheeses, and dairy products. Remain active and exercise as tolerated. Continue to monitor.  Vitamin D deficiency At goal. Continue supplement Continue to monitor  Overweight (BMI 25.0-29.9) Discussed appropriate BMI Diet modification. Physical activity. Encouraged/praised to build confidence.  Medication management All medications discussed and reviewed in full. All questions and concerns regarding medications addressed.    Osteoporosis DEXA ordered Pursue a combination of weight-bearing exercises and strength training. Patients with severe mobility impairment should be referred for physical therapy. Advised on fall prevention measures including proper lighting in all rooms, removal of area rugs and floor clutter, use of walking devices as deemed appropriate, avoidance of uneven walking surfaces. Smoking cessation and moderate alcohol consumption if applicable Consume 800 to 1000 IU of vitamin D daily with a goal vitamin D serum value of 30 ng/mL or higher. Aim for 1000 to 1200 mg of elemental calcium daily through supplements and/or dietary sources.  Orders Placed This Encounter  Procedures   Flu vaccine HIGH DOSE PF (Fluzone High dose)   CBC with Differential/Platelet   COMPLETE METABOLIC PANEL WITH GFR   Hemoglobin A1c   Lipid panel   TSH   Notify office for further evaluation and treatment, questions or concerns if any reported s/s fail to improve.   The patient was advised to call back or seek an in-person evaluation if any symptoms worsen or if the condition fails to improve as anticipated.   Further disposition pending results of labs. Discussed med's effects and SE's.    I discussed the assessment and treatment plan with the patient. The patient was provided an opportunity to ask questions and all were answered. The patient agreed with the plan and demonstrated an understanding of the instructions.  Discussed med's  effects and SE's. Screening labs and tests as requested with regular follow-up as  recommended.  I provided 40 minutes of face-to-face time during this encounter including counseling, chart review, and critical decision making was preformed.  Future Appointments  Date Time Provider Department Center  01/09/2023  2:45 PM Rachel Moulds, MD [facility] None  03/26/2023 11:00 AM Micki Riley, MD [facility] None  04/05/2023  2:30 PM [name], MD [facility] None  05/02/2023  2:00 PM Christell Constant, MD [facility]  05/08/2023  4:00 PM [facility] DX DEXA 1 [facility]  07/04/2023  2:30 PM Adela Glimpse, NP [facility] None  10/09/2023  2:00 PM [name], MD [facility] None     Subjective:  Jillian Rodgers is a 77 y.o. female who presents for a 3 month follow up.   Overall she report feeling well.  She has no new concerns to report in clinic today.  Had right inguinal hernia repair with Dr. [facility], 10/24/21. Doing well  She had DEXA 06/2020 showing osteoporosis limited to radius, L fem T score was -1.6, stable from 2017. She was prescribed fosamax but prefers not to start unless absolutely necessary due to SE risk.   Patient has GERD, failed trial of transition of Protonix to Ranitidine alone and currently is controlled on Protonix/am. She underwent EGD 03/2018 which showed gastritis without evidence of dysplasia, malignancy or h. Pylori.  She has R hammer toe surgery Sept 9th 2021 with Dr. Leticia Penna, still some trouble but improved.   She is followed by neurology for headaches/migraines and getting botox shots, much improved.   She is followed by [facility]Hematology for IDA. Next apt 12/2021    She also follows with Cardiollgy for paroxysmal atrial fib, not on anticoagulation d/t h/o thrombocytopenia, recently seen on 08/31/21 via telehealth.  She continues to be stable.  BMI is Body mass index is 28.51 kg/m., she has not been working  on diet and exercise, Wt Readings from Last 3 Encounters:  01/03/23 158 lb 6.4 oz (71.8 kg)  12/06/22 156 lb 9.6 oz (71 kg)  10/25/22 153 lb (69.4 kg)   Was having frequent near syncopal episodes, reports resolved spontaneously and none this last year   Her blood pressure has been controlled at home, but low in clinic today.  She is asymptomatic.  Today their BP is BP: 118/78 She does not workout. She denies chest pain, shortness of breath, dizziness.  She is not staying well hydrated.   She is on cholesterol medication (pravastatin 40 mg daily) and denies myalgias. Her cholesterol is not at goal. The cholesterol last visit was:   Lab Results  Component Value Date   CHOL 178 09/26/2022   HDL 79 09/26/2022   LDLCALC 76 09/26/2022   TRIG 151 (H) 09/26/2022   CHOLHDL 2.3 09/26/2022    She has not been working on diet and exercise for glucose management, and denies foot ulcerations, increased appetite, nausea, paresthesia of the feet, polydipsia, polyuria, visual disturbances, vomiting and weight loss. Last A1C in the office was:  Lab Results  Component Value Date   HGBA1C 5.7 (H) 09/26/2022   She is on thyroid medication. Her medication was not changed last visit.  50 mcg daily Lab Results  Component Value Date   TSH 2.46 09/26/2022   She is followed by Dr. Annie Sable. Follows for renal cysts, stable on recent MRI. Also CKD III. Last GFR: Lab Results  Component Value Date   GFRNONAA 48 (L) 10/18/2022   GFRNONAA 43 (L) 10/17/2022   GFRNONAA 39 (L) 10/17/2022  Patient is on Vitamin D supplement and at goal at recent check:    Lab Results  Component Value Date   VD25OH 79 09/26/2022      She has persistent stable thrombocytopenia for many years, recently working up by hematology Dr. Al Pimple, had bone marrow biopsy but unremarkable.     Latest Ref Rng & Units 10/18/2022    4:43 AM 10/17/2022   11:29 AM 09/26/2022    3:47 PM  CBC  WBC 4.0 - 10.5 K/uL 5.3  6.3  6.8    Hemoglobin 12.0 - 15.0 g/dL 40.9  81.1  91.4   Hematocrit 36.0 - 46.0 % 39.5  42.5  38.8   Platelets 150 - 400 K/uL 41  48  76    She had iron def anemia following surgery which has resolved.  Lab Results  Component Value Date   IRON 83 07/13/2022   TIBC 329 07/13/2022   FERRITIN 44 07/13/2022      Medication Review: Current Outpatient Medications on File Prior to Visit  Medication Sig Dispense Refill   acetaminophen (TYLENOL) 325 MG tablet Take 650 mg by mouth every 6 (six) hours as needed for moderate pain or headache.     Ascorbic Acid (VITAMIN C) 500 MG CAPS Take 500 mg by mouth daily.      aspirin EC 81 MG tablet Take 1 tablet (81 mg total) by mouth daily. Swallow whole. 30 tablet 12   atorvastatin (LIPITOR) 80 MG tablet Take 1 tablet (80 mg total) by mouth daily. 30 tablet 3   BOTOX 100 units SOLR injection Inject 100 Units into the muscle See admin instructions. Inject 100 units intramuscularly every 6-8 weeks     cetirizine (ZYRTEC) 10 MG tablet Take 10 mg by mouth daily.     Cholecalciferol (VITAMIN D) 125 MCG (5000 UT) CAPS Take 5,000 Units by mouth daily.     divalproex (DEPAKOTE ER) 500 MG 24 hr tablet Take 1 tablet (500 mg total) by mouth daily. 30 tablet 3   gabapentin (NEURONTIN) 300 MG capsule Take 300 mg (1 tablet) in the morning and 600 mg (2 tablets) at bedtime 90 capsule 3   levothyroxine (SYNTHROID) 50 MCG tablet Take  1 tablet  Daily  on an empty stomach with only water for 30 minutes & no Antacid meds, Calcium or Magnesium for 4 hours & avoid Biotin                                                                                                                    /                                                                   TAKE  BY                                                 MOUTH 90 tablet 3   pantoprazole (PROTONIX) 40 MG tablet Take  1 tablet  2 x /day  for Acid Indigestion & Heartburn 180 tablet 3   Polyethyl  Glycol-Propyl Glycol (SYSTANE ULTRA) 0.4-0.3 % SOLN Place 1 drop into both eyes 3 (three) times daily as needed (dry eyes).     Probiotic Product (PROBIOTIC PO) Take 1 tablet by mouth daily.     Propylene Glycol (SYSTANE BALANCE) 0.6 % SOLN Place 1 drop into both eyes at bedtime.     zinc gluconate 50 MG tablet Take 50 mg by mouth daily.     No current facility-administered medications on file prior to visit.    No Known Allergies  Current Problems (verified) Patient Active Problem List   Diagnosis Date Noted   TIA (transient ischemic attack) 10/17/2022   Primary hypertension 04/28/2022   Snoring 03/04/2021   Paroxysmal atrial fibrillation (HCC) 11/01/2020   Secondary hypercoagulable state (HCC) 11/01/2020   Increased urinary frequency 10/20/2020   Localized osteoporosis without current pathological fracture 10/20/2020   IDA (iron deficiency anemia) 04/07/2020   Thrombocytopenia (HCC) 11/25/2015   Encounter for Medicare annual wellness exam 06/14/2015   Overweight (BMI 25.0-29.9) 01/28/2015   CKD (chronic kidney disease) stage 3, GFR 30-59 ml/min (HCC) 01/19/2014   Upper airway cough syndrome 12/16/2013   Vitamin D deficiency 05/21/2013   Medication management 05/21/2013   Labile hypertension 01/31/2013   Degenerative joint disease    GERD (gastroesophageal reflux disease)    Hyperlipidemia    Hypothyroidism     Screening Tests Immunization History  Administered Date(s) Administered   Influenza Inj Mdck Quad With Preservative 01/15/2018   Influenza, High Dose Seasonal PF 12/29/2013, 01/15/2015, 02/01/2016, 02/28/2017, 02/07/2018, 12/30/2018, 02/12/2020, 02/01/2021, 01/30/2022, 01/03/2023   Influenza-Unspecified 01/24/2011, 02/02/2012, 01/16/2015   PFIZER(Purple Top)SARS-COV-2 Vaccination 05/12/2019, 06/02/2019, 01/22/2020, 07/23/2020   Pneumococcal Conjugate-13 10/28/2014   Pneumococcal-Unspecified 11/28/2011   Td 11/17/2010   Tdap 11/12/2014   Zoster, Live 11/17/2010      Patient Care Team: [name], MD as PCP - General (Internal Medicine) Christell Constant, MD as PCP - Cardiology (Cardiology) Annie Sable, [name]  SURGICAL HISTORY She  has a past surgical history that includes Ventral hernia repair; Cholecystectomy (1996); Pilonidal cyst excision (1972); Tonsillectomy; Upper gi endoscopy; Colonoscopy (2009); Hernia repair (1985); Hernia repair (2005 & 2009); SKIN CANCER REMOVAL ; BILATERAL CATARACT SURGERY ; BLERPHOROPLASTY \; Inguinal hernia repair (Left, 04/30/2020); and Inguinal hernia repair (Right, 10/24/2021). FAMILY HISTORY Her family history includes Breast cancer in her sister; Cancer in her mother; Congestive Heart Failure in her father; Gout in her maternal aunt; Heart disease in her mother; Hypertension in her mother; Ovarian cancer in her mother; Stroke in her paternal uncle; Uterine cancer in her mother. SOCIAL HISTORY She  reports that she has never smoked. She has never used smokeless tobacco. She reports that she does not drink alcohol and does not use drugs.   Review of Systems  Constitutional:  Negative for malaise/fatigue and weight loss.  HENT:  Negative for hearing loss and tinnitus.   Eyes:  Negative for blurred vision and double vision.  Respiratory:  Negative for cough, sputum production, shortness of breath and wheezing.  Cardiovascular:  Negative for chest pain, palpitations, orthopnea, claudication, leg swelling and PND.  Gastrointestinal:  Negative for abdominal pain, blood in stool, constipation, diarrhea, heartburn, melena, nausea and vomiting.  Genitourinary:  Positive for frequency and urgency. Negative for dysuria, flank pain and hematuria.  Musculoskeletal:  Negative for falls, joint pain and myalgias.  Skin:  Negative for rash.  Neurological:  Positive for headaches (chronic, not new, getting botox injections). Negative for dizziness, tingling, sensory change,  loss of consciousness and weakness.  Endo/Heme/Allergies:  Negative for polydipsia.  Psychiatric/Behavioral: Negative.  Negative for depression, memory loss, substance abuse and suicidal ideas. The patient is not nervous/anxious and does not have insomnia.   All other systems reviewed and are negative.    Objective:     Today's Vitals   01/03/23 1451  BP: 118/78  Pulse: (!) 110  Resp: 16  Temp: 97.9 F (36.6 C)  SpO2: 98%  Weight: 158 lb 6.4 oz (71.8 kg)  Height: 5' 2.5" (1.588 m)   Body mass index is 28.51 kg/m.  General appearance: alert, no distress, WD/WN,  female HEENT: normocephalic, sclerae anicteric, TMs pearly, nares patent, no discharge or erythema, pharynx normal Oral cavity: MMM, no lesions Neck: supple, no lymphadenopathy, no thyromegaly, no masses Heart: RRR, normal S1, S2, no murmurs Lungs: CTA bilaterally, no wheezes, rhonchi, or rales Abdomen: +bs, soft, LLQ/groin tenderness with easily reduced inguinal hernia, non distended, no masses, no hepatomegaly, no splenomegaly Musculoskeletal: nontender, no swelling, no obvious deformity Extremities: no edema, no cyanosis, no clubbing Pulses: 2+ symmetric, upper and lower extremities, normal cap refill Neurological: alert, oriented x 3, CN2-12 intact, strength normal upper extremities and lower extremities, sensation normal throughout, DTRs 2+ throughout, no cerebellar signs, gait normal Psychiatric: normal affect, behavior normal, pleasant    Terralyn Matsumura, NP   01/03/2023

## 2023-01-04 LAB — HEMOGLOBIN A1C
Hgb A1c MFr Bld: 5.7 % of total Hgb — ABNORMAL HIGH (ref <5.7)
Mean Plasma Glucose: 117 mg/dL
eAG (mmol/L): 6.5 mmol/L

## 2023-01-04 LAB — COMPLETE METABOLIC PANEL WITH GFR
AG Ratio: 1.8 (calc) (ref 1.0–2.5)
ALT: 16 U/L (ref 6–29)
AST: 20 U/L (ref 10–35)
Albumin: 3.9 g/dL (ref 3.6–5.1)
Alkaline phosphatase (APISO): 63 U/L (ref 37–153)
BUN/Creatinine Ratio: 25 (calc) — ABNORMAL HIGH (ref 6–22)
BUN: 29 mg/dL — ABNORMAL HIGH (ref 7–25)
CO2: 29 mmol/L (ref 20–32)
Calcium: 9.8 mg/dL (ref 8.6–10.4)
Chloride: 104 mmol/L (ref 98–110)
Creat: 1.18 mg/dL — ABNORMAL HIGH (ref 0.60–1.00)
Globulin: 2.2 g/dL (calc) (ref 1.9–3.7)
Glucose, Bld: 96 mg/dL (ref 65–99)
Potassium: 3.9 mmol/L (ref 3.5–5.3)
Sodium: 140 mmol/L (ref 135–146)
Total Bilirubin: 0.6 mg/dL (ref 0.2–1.2)
Total Protein: 6.1 g/dL (ref 6.1–8.1)
eGFR: 47 mL/min/{1.73_m2} — ABNORMAL LOW (ref >=60)

## 2023-01-04 LAB — CBC WITH DIFFERENTIAL/PLATELET
Absolute Monocytes: 946 cells/uL (ref 200–950)
Basophils Absolute: 33 cells/uL (ref 0–200)
Basophils Relative: 0.4 %
Eosinophils Absolute: 100 cells/uL (ref 15–500)
Eosinophils Relative: 1.2 %
HCT: 35.1 % (ref 35.0–45.0)
Hemoglobin: 11.7 g/dL (ref 11.7–15.5)
Lymphs Abs: 1403 cells/uL (ref 850–3900)
MCH: 31 pg (ref 27.0–33.0)
MCHC: 33.3 g/dL (ref 32.0–36.0)
MCV: 93.1 fL (ref 80.0–100.0)
MPV: 12.4 fL (ref 7.5–12.5)
Monocytes Relative: 11.4 %
Neutro Abs: 5818 cells/uL (ref 1500–7800)
Neutrophils Relative %: 70.1 %
Platelets: 39 10*3/uL — ABNORMAL LOW (ref 140–400)
RBC: 3.77 10*6/uL — ABNORMAL LOW (ref 3.80–5.10)
RDW: 13.3 % (ref 11.0–15.0)
Total Lymphocyte: 16.9 %
WBC: 8.3 10*3/uL (ref 3.8–10.8)

## 2023-01-04 LAB — LIPID PANEL
Cholesterol: 143 mg/dL (ref <200)
HDL: 72 mg/dL (ref >=50)
LDL Cholesterol (Calc): 53 mg/dL (calc)
Non-HDL Cholesterol (Calc): 71 mg/dL (calc) (ref <130)
Total CHOL/HDL Ratio: 2 (calc) (ref <5.0)
Triglycerides: 94 mg/dL (ref <150)

## 2023-01-04 LAB — TSH: TSH: 2.71 mIU/L (ref 0.40–4.50)

## 2023-01-05 ENCOUNTER — Telehealth: Payer: Self-pay | Admitting: Hematology and Oncology

## 2023-01-05 NOTE — Telephone Encounter (Signed)
Left patient a message regarding scheduled appointment times/dates; left call back number if needed for reschedule

## 2023-01-07 NOTE — Patient Instructions (Signed)

## 2023-01-09 ENCOUNTER — Other Ambulatory Visit: Payer: Self-pay

## 2023-01-09 ENCOUNTER — Inpatient Hospital Stay: Payer: Medicare PPO | Admitting: Hematology and Oncology

## 2023-01-09 ENCOUNTER — Inpatient Hospital Stay (HOSPITAL_COMMUNITY)
Admission: EM | Admit: 2023-01-09 | Discharge: 2023-01-13 | DRG: 690 | Disposition: A | Discharge status: Home / Self Care | Payer: Medicare PPO | Attending: Internal Medicine | Admitting: Internal Medicine

## 2023-01-09 ENCOUNTER — Inpatient Hospital Stay: Payer: Medicare PPO

## 2023-01-09 ENCOUNTER — Emergency Department (HOSPITAL_COMMUNITY): Payer: Medicare PPO

## 2023-01-09 ENCOUNTER — Encounter (HOSPITAL_COMMUNITY): Payer: Self-pay

## 2023-01-09 VITALS — BP 88/66 | HR 121 | Temp 98.4°F | Resp 17 | Wt 152.5 lb

## 2023-01-09 DIAGNOSIS — Z79890 Hormone replacement therapy (postmenopausal): Secondary | ICD-10-CM

## 2023-01-09 DIAGNOSIS — G4733 Obstructive sleep apnea (adult) (pediatric): Secondary | ICD-10-CM | POA: Diagnosis present

## 2023-01-09 DIAGNOSIS — Z823 Family history of stroke: Secondary | ICD-10-CM

## 2023-01-09 DIAGNOSIS — Z8041 Family history of malignant neoplasm of ovary: Secondary | ICD-10-CM | POA: Insufficient documentation

## 2023-01-09 DIAGNOSIS — Z7901 Long term (current) use of anticoagulants: Secondary | ICD-10-CM | POA: Diagnosis not present

## 2023-01-09 DIAGNOSIS — B964 Proteus (mirabilis) (morganii) as the cause of diseases classified elsewhere: Secondary | ICD-10-CM | POA: Diagnosis present

## 2023-01-09 DIAGNOSIS — R5381 Other malaise: Secondary | ICD-10-CM | POA: Insufficient documentation

## 2023-01-09 DIAGNOSIS — Z1152 Encounter for screening for COVID-19: Secondary | ICD-10-CM

## 2023-01-09 DIAGNOSIS — Z79899 Other long term (current) drug therapy: Secondary | ICD-10-CM | POA: Insufficient documentation

## 2023-01-09 DIAGNOSIS — Z8249 Family history of ischemic heart disease and other diseases of the circulatory system: Secondary | ICD-10-CM

## 2023-01-09 DIAGNOSIS — I10 Essential (primary) hypertension: Secondary | ICD-10-CM | POA: Diagnosis present

## 2023-01-09 DIAGNOSIS — Z7982 Long term (current) use of aspirin: Secondary | ICD-10-CM

## 2023-01-09 DIAGNOSIS — Z803 Family history of malignant neoplasm of breast: Secondary | ICD-10-CM | POA: Insufficient documentation

## 2023-01-09 DIAGNOSIS — R000 Tachycardia, unspecified: Secondary | ICD-10-CM | POA: Diagnosis not present

## 2023-01-09 DIAGNOSIS — Z8489 Family history of other specified conditions: Secondary | ICD-10-CM

## 2023-01-09 DIAGNOSIS — I959 Hypotension, unspecified: Secondary | ICD-10-CM | POA: Insufficient documentation

## 2023-01-09 DIAGNOSIS — R001 Bradycardia, unspecified: Secondary | ICD-10-CM | POA: Insufficient documentation

## 2023-01-09 DIAGNOSIS — D696 Thrombocytopenia, unspecified: Secondary | ICD-10-CM | POA: Diagnosis not present

## 2023-01-09 DIAGNOSIS — Z7401 Bed confinement status: Secondary | ICD-10-CM

## 2023-01-09 DIAGNOSIS — Z8049 Family history of malignant neoplasm of other genital organs: Secondary | ICD-10-CM | POA: Diagnosis not present

## 2023-01-09 DIAGNOSIS — E039 Hypothyroidism, unspecified: Secondary | ICD-10-CM | POA: Diagnosis present

## 2023-01-09 DIAGNOSIS — I4891 Unspecified atrial fibrillation: Secondary | ICD-10-CM | POA: Diagnosis not present

## 2023-01-09 DIAGNOSIS — N3000 Acute cystitis without hematuria: Secondary | ICD-10-CM | POA: Diagnosis not present

## 2023-01-09 DIAGNOSIS — K219 Gastro-esophageal reflux disease without esophagitis: Secondary | ICD-10-CM | POA: Diagnosis present

## 2023-01-09 DIAGNOSIS — I4819 Other persistent atrial fibrillation: Secondary | ICD-10-CM | POA: Diagnosis present

## 2023-01-09 DIAGNOSIS — Z8673 Personal history of transient ischemic attack (TIA), and cerebral infarction without residual deficits: Secondary | ICD-10-CM

## 2023-01-09 DIAGNOSIS — R918 Other nonspecific abnormal finding of lung field: Secondary | ICD-10-CM | POA: Diagnosis not present

## 2023-01-09 DIAGNOSIS — E876 Hypokalemia: Secondary | ICD-10-CM | POA: Diagnosis not present

## 2023-01-09 DIAGNOSIS — Z85828 Personal history of other malignant neoplasm of skin: Secondary | ICD-10-CM

## 2023-01-09 DIAGNOSIS — N1831 Chronic kidney disease, stage 3a: Secondary | ICD-10-CM | POA: Diagnosis not present

## 2023-01-09 DIAGNOSIS — Z808 Family history of malignant neoplasm of other organs or systems: Secondary | ICD-10-CM | POA: Insufficient documentation

## 2023-01-09 DIAGNOSIS — I129 Hypertensive chronic kidney disease with stage 1 through stage 4 chronic kidney disease, or unspecified chronic kidney disease: Secondary | ICD-10-CM | POA: Diagnosis not present

## 2023-01-09 DIAGNOSIS — E785 Hyperlipidemia, unspecified: Secondary | ICD-10-CM | POA: Diagnosis present

## 2023-01-09 DIAGNOSIS — D693 Immune thrombocytopenic purpura: Secondary | ICD-10-CM | POA: Diagnosis present

## 2023-01-09 DIAGNOSIS — E782 Mixed hyperlipidemia: Secondary | ICD-10-CM | POA: Diagnosis not present

## 2023-01-09 LAB — CBC WITH DIFFERENTIAL/PLATELET
Abs Immature Granulocytes: 0.1 10*3/uL — ABNORMAL HIGH (ref 0.00–0.07)
Abs Immature Granulocytes: 0.14 10*3/uL — ABNORMAL HIGH (ref 0.00–0.07)
Basophils Absolute: 0 10*3/uL (ref 0.0–0.1)
Basophils Absolute: 0 10*3/uL (ref 0.0–0.1)
Basophils Relative: 1 %
Basophils Relative: 0 %
Eosinophils Absolute: 0.1 10*3/uL (ref 0.0–0.5)
Eosinophils Absolute: 0.1 10*3/uL (ref 0.0–0.5)
Eosinophils Relative: 1 %
Eosinophils Relative: 1 %
HCT: 39.5 % (ref 36.0–46.0)
HCT: 40.4 % (ref 36.0–46.0)
Hemoglobin: 12.8 g/dL (ref 12.0–15.0)
Hemoglobin: 13 g/dL (ref 12.0–15.0)
Immature Granulocytes: 1 %
Immature Granulocytes: 2 %
Lymphocytes Relative: 13 %
Lymphocytes Relative: 11 %
Lymphs Abs: 1.2 10*3/uL (ref 0.7–4.0)
Lymphs Abs: 1 10*3/uL (ref 0.7–4.0)
MCH: 30.6 pg (ref 26.0–34.0)
MCH: 30.9 pg (ref 26.0–34.0)
MCHC: 32.4 g/dL (ref 30.0–36.0)
MCHC: 32.2 g/dL (ref 30.0–36.0)
MCV: 94.5 fL (ref 80.0–100.0)
MCV: 96 fL (ref 80.0–100.0)
Monocytes Absolute: 1.2 10*3/uL — ABNORMAL HIGH (ref 0.1–1.0)
Monocytes Absolute: 1.2 10*3/uL — ABNORMAL HIGH (ref 0.1–1.0)
Monocytes Relative: 14 %
Monocytes Relative: 13 %
Neutro Abs: 6.2 10*3/uL (ref 1.7–7.7)
Neutro Abs: 6.5 10*3/uL (ref 1.7–7.7)
Neutrophils Relative %: 70 %
Neutrophils Relative %: 73 %
Platelets: 62 10*3/uL — ABNORMAL LOW (ref 150–400)
Platelets: 62 10*3/uL — ABNORMAL LOW (ref 150–400)
RBC: 4.18 MIL/uL (ref 3.87–5.11)
RBC: 4.21 MIL/uL (ref 3.87–5.11)
RDW: 13.5 % (ref 11.5–15.5)
RDW: 13.6 % (ref 11.5–15.5)
WBC: 8.8 10*3/uL (ref 4.0–10.5)
WBC: 8.9 10*3/uL (ref 4.0–10.5)
nRBC: 0 % (ref 0.0–0.2)
nRBC: 0 % (ref 0.0–0.2)

## 2023-01-09 LAB — URINALYSIS, ROUTINE W REFLEX MICROSCOPIC
Bilirubin Urine: NEGATIVE
Glucose, UA: NEGATIVE mg/dL
Hgb urine dipstick: NEGATIVE
Ketones, ur: NEGATIVE mg/dL
Nitrite: NEGATIVE
Protein, ur: 100 mg/dL — AB
Specific Gravity, Urine: 1.018 (ref 1.005–1.030)
WBC, UA: >50 WBC/hpf (ref 0–5)
pH: 5 (ref 5.0–8.0)

## 2023-01-09 LAB — TROPONIN I (HIGH SENSITIVITY)
Troponin I (High Sensitivity): 16 ng/L (ref <18)
Troponin I (High Sensitivity): 16 ng/L (ref <18)

## 2023-01-09 LAB — COMPREHENSIVE METABOLIC PANEL
ALT: 21 U/L (ref 0–44)
AST: 24 U/L (ref 15–41)
Albumin: 3.2 g/dL — ABNORMAL LOW (ref 3.5–5.0)
Alkaline Phosphatase: 57 U/L (ref 38–126)
Anion gap: 9 (ref 5–15)
BUN: 36 mg/dL — ABNORMAL HIGH (ref 8–23)
CO2: 25 mmol/L (ref 22–32)
Calcium: 9.6 mg/dL (ref 8.9–10.3)
Chloride: 106 mmol/L (ref 98–111)
Creatinine, Ser: 1.4 mg/dL — ABNORMAL HIGH (ref 0.44–1.00)
GFR, Estimated: 39 mL/min — ABNORMAL LOW (ref >60)
Glucose, Bld: 113 mg/dL — ABNORMAL HIGH (ref 70–99)
Potassium: 3.6 mmol/L (ref 3.5–5.1)
Sodium: 140 mmol/L (ref 135–145)
Total Bilirubin: 0.4 mg/dL (ref 0.3–1.2)
Total Protein: 6.9 g/dL (ref 6.5–8.1)

## 2023-01-09 LAB — RESP PANEL BY RT-PCR (RSV, FLU A&B, COVID)  RVPGX2
Influenza A by PCR: NEGATIVE
Influenza B by PCR: NEGATIVE
Resp Syncytial Virus by PCR: NEGATIVE
SARS Coronavirus 2 by RT PCR: NEGATIVE

## 2023-01-09 LAB — PROTIME-INR
INR: 0.9 (ref 0.8–1.2)
Prothrombin Time: 12.6 seconds (ref 11.4–15.2)

## 2023-01-09 LAB — I-STAT CG4 LACTIC ACID, ED: Lactic Acid, Venous: 1.3 mmol/L (ref 0.5–1.9)

## 2023-01-09 LAB — BRAIN NATRIURETIC PEPTIDE: B Natriuretic Peptide: 601.9 pg/mL — ABNORMAL HIGH (ref 0.0–100.0)

## 2023-01-09 MED ORDER — TRAZODONE HCL 50 MG PO TABS: 25.0000 mg | ORAL_TABLET | Freq: Every evening | ORAL | Status: DC | PRN

## 2023-01-09 MED ORDER — ALBUTEROL SULFATE (2.5 MG/3ML) 0.083% IN NEBU: 2.5000 mg | INHALATION_SOLUTION | RESPIRATORY_TRACT | Status: DC | PRN

## 2023-01-09 MED ORDER — METOPROLOL TARTRATE 25 MG PO TABS
12.5000 mg | ORAL_TABLET | Freq: Once | ORAL | Status: AC
  Administered 2023-01-09: 12.5 mg via ORAL
  Filled 2023-01-09: qty 1

## 2023-01-09 MED ORDER — SODIUM CHLORIDE 0.9 % IV SOLN: INTRAVENOUS | Status: DC

## 2023-01-09 MED ORDER — ATORVASTATIN CALCIUM 40 MG PO TABS
80.0000 mg | ORAL_TABLET | Freq: Every day | ORAL | Status: DC
  Administered 2023-01-10 – 2023-01-13 (×4): 80 mg via ORAL
  Filled 2023-01-09 (×4): qty 2

## 2023-01-09 MED ORDER — SODIUM CHLORIDE 0.9 % IV SOLN
1.0000 g | INTRAVENOUS | Status: DC
  Administered 2023-01-10 – 2023-01-11 (×2): 1 g via INTRAVENOUS
  Filled 2023-01-09 (×2): qty 10

## 2023-01-09 MED ORDER — LACTATED RINGERS IV BOLUS
1000.0000 mL | Freq: Once | INTRAVENOUS | Status: AC
  Administered 2023-01-09: 1000 mL via INTRAVENOUS

## 2023-01-09 MED ORDER — LORATADINE 10 MG PO TABS
10.0000 mg | ORAL_TABLET | Freq: Every day | ORAL | Status: DC
  Administered 2023-01-10 – 2023-01-13 (×4): 10 mg via ORAL
  Filled 2023-01-09 (×4): qty 1

## 2023-01-09 MED ORDER — SODIUM CHLORIDE 0.9 % IV SOLN
1.0000 g | Freq: Once | INTRAVENOUS | Status: AC
  Administered 2023-01-09: 1 g via INTRAVENOUS
  Filled 2023-01-09: qty 10

## 2023-01-09 MED ORDER — ONDANSETRON HCL 4 MG/2ML IJ SOLN: 4.0000 mg | Freq: Four times a day (QID) | INTRAMUSCULAR | Status: DC | PRN

## 2023-01-09 MED ORDER — METOPROLOL TARTRATE 25 MG PO TABS: 12.5000 mg | ORAL_TABLET | Freq: Two times a day (BID) | ORAL | Status: DC

## 2023-01-09 MED ORDER — ASPIRIN 81 MG PO TBEC
81.0000 mg | DELAYED_RELEASE_TABLET | Freq: Every day | ORAL | Status: DC
  Administered 2023-01-10: 81 mg via ORAL
  Filled 2023-01-09: qty 1

## 2023-01-09 MED ORDER — PANTOPRAZOLE SODIUM 40 MG PO TBEC
40.0000 mg | DELAYED_RELEASE_TABLET | Freq: Two times a day (BID) | ORAL | Status: DC
  Administered 2023-01-09 – 2023-01-13 (×8): 40 mg via ORAL
  Filled 2023-01-09 (×8): qty 1

## 2023-01-09 MED ORDER — DILTIAZEM HCL-DEXTROSE 125-5 MG/125ML-% IV SOLN (PREMIX)
5.0000 mg/h | INTRAVENOUS | Status: DC
  Administered 2023-01-09: 5 mg/h via INTRAVENOUS
  Administered 2023-01-10: 15 mg/h via INTRAVENOUS
  Filled 2023-01-09 (×2): qty 125

## 2023-01-09 MED ORDER — METOPROLOL TARTRATE 5 MG/5ML IV SOLN
5.0000 mg | Freq: Once | INTRAVENOUS | Status: AC
  Administered 2023-01-09: 5 mg via INTRAVENOUS
  Filled 2023-01-09: qty 5

## 2023-01-09 MED ORDER — ACETAMINOPHEN 325 MG PO TABS
650.0000 mg | ORAL_TABLET | Freq: Four times a day (QID) | ORAL | Status: DC | PRN
  Administered 2023-01-10 – 2023-01-12 (×4): 650 mg via ORAL
  Filled 2023-01-09 (×4): qty 2

## 2023-01-09 MED ORDER — DILTIAZEM LOAD VIA INFUSION
15.0000 mg | Freq: Once | INTRAVENOUS | Status: AC
  Administered 2023-01-09: 15 mg via INTRAVENOUS
  Filled 2023-01-09: qty 15

## 2023-01-09 MED ORDER — LACTATED RINGERS IV BOLUS
500.0000 mL | Freq: Once | INTRAVENOUS | Status: AC
  Administered 2023-01-09: 500 mL via INTRAVENOUS

## 2023-01-09 MED ORDER — ONDANSETRON HCL 4 MG PO TABS: 4.0000 mg | ORAL_TABLET | Freq: Four times a day (QID) | ORAL | Status: DC | PRN

## 2023-01-09 MED ORDER — ACETAMINOPHEN 650 MG RE SUPP: 650.0000 mg | Freq: Four times a day (QID) | RECTAL | Status: DC | PRN

## 2023-01-09 MED ORDER — LEVOTHYROXINE SODIUM 50 MCG PO TABS
50.0000 ug | ORAL_TABLET | Freq: Every day | ORAL | Status: DC
  Administered 2023-01-10 – 2023-01-13 (×4): 50 ug via ORAL
  Filled 2023-01-09 (×4): qty 1

## 2023-01-09 NOTE — H&P (Signed)
History and Physical  Jillian Rodgers:[id number] DOB: [birth year] DOA: 01/09/2023  PCP: [name], MD   Chief Complaint: Dizziness, lethargy, tachycardia  HPI: Jillian Rodgers is a 77 y.o. female with medical history significant for chronic thrombocytopenia, hyperlipidemia, hypothyroidism, CKD stage III, TIA on aspirin, paroxysmal atrial fibrillation not on anticoagulation admitted to the hospital with A-fib with RVR.  She underwent successful DCCV on 10/25/2020 and at the time was on Eliquis, she wore a cardiac monitor later that year and was taken off Eliquis due to no further atrial fibrillation and chronic thrombocytopenia for which she is followed by hematology.  She had no known recurrence of atrial fibrillation, was hospitalized in July for TIA and since then has been on aspirin.  She had unremarkable echocardiography during that hospital stay than grade 1 diastolic dysfunction.  For the last week or so, she has been having burning with urination, and for the last couple of weeks she has been lethargic, feeling dizzy and lightheaded with exertion (husband at bedside states that she has sometimes been dizzy and lightheaded even when lying in bed), has not felt any palpitations, or chest pain.  When she had atrial fibrillation back in 2022 she had palpitations.  This time is different.  She had a follow-up appointment with her hematologist, who noted that the patient was tachycardic, and mentioned her symptoms.  She was sent to the ER for evaluation.  ED Course: Patient has been afebrile, heart rate fluctuating from 104-141, blood pressure stable but 170/110 during my interview.  Lab work significant for BNP 602, normal troponins, stable thrombocytopenia, creatinine 1.40 up from her baseline of approximately 1.2.  She was given a dose of IV and oral metoprolol, after which her heart rate improved, however during my interview of the patient her heart rate is elevated into the  130s.  Review of Systems: Please see HPI for pertinent positives and negatives. A complete 10 system review of systems are otherwise negative.  Past Medical History:  Diagnosis Date   Anemia with low platelet count (HCC)    Chronic kidney disease    Degenerative joint disease    Dysrhythmia    a fib   GERD (gastroesophageal reflux disease)    History of kidney stones    Hyperlipidemia    Hypothyroidism    Inguinal hernia 02/19/2019   Labile hypertension    Migraines    neurontin helps   Pneumonia    hx of walking pneumonia    Primary hypertension 04/28/2022   Past Surgical History:  Procedure Laterality Date   BILATERAL CATARACT SURGERY      BLERPHOROPLASTY \     CHOLECYSTECTOMY  1996   open   COLONOSCOPY  2009   recommended 10 year f/u   HERNIA REPAIR  1985   umb hernia rpr   HERNIA REPAIR  2005 & 2009   ventral hernia rpr   INGUINAL HERNIA REPAIR Left 04/30/2020   Procedure: OPEN LEFT INGUINAL HERNIA REPAIR WITH MESH;  Surgeon: Berna Bue, MD;  Location: [facility];  Service: General;  Laterality: Left;   INGUINAL HERNIA REPAIR Right 10/24/2021   Procedure: OPEN RIGHT INGUINAL HERNIA REPAIR WITH MESH ;  Surgeon: Berna Bue, MD;  Location: [facility];  Service: General;  Laterality: Right;   PILONIDAL CYST EXCISION  1972   SKIN CANCER REMOVAL      TONSILLECTOMY     UPPER GI ENDOSCOPY     VENTRAL HERNIA REPAIR  Social History:  reports that she has never smoked. She has never used smokeless tobacco. She reports that she does not drink alcohol and does not use drugs.   No Known Allergies  Family History  Problem Relation Age of Onset   Ovarian cancer Mother    Heart disease Mother    Cancer Mother    Uterine cancer Mother    Hypertension Mother    Breast cancer Sister    Stroke Paternal Uncle    Congestive Heart Failure Father    Gout Maternal Aunt    Colon cancer Neg Hx    Esophageal cancer Neg Hx    Stomach cancer Neg Hx    Rectal cancer Neg  Hx      Prior to Admission medications   Medication Sig Start Date End Date Taking? Authorizing Provider  acetaminophen (TYLENOL) 325 MG tablet Take 650 mg by mouth every 6 (six) hours as needed for moderate pain or headache.    [provider]  Ascorbic Acid (VITAMIN C) 500 MG CAPS Take 500 mg by mouth daily.     [provider]  aspirin EC 81 MG tablet Take 1 tablet (81 mg total) by mouth daily. Swallow whole. 10/19/22   Rai, Delene Ruffini, MD  atorvastatin (LIPITOR) 80 MG tablet Take 1 tablet (80 mg total) by mouth daily. 10/19/22   Rai, Ripudeep Kirtland Bouchard, MD  BOTOX 100 units SOLR injection Inject 100 Units into the muscle See admin instructions. Inject 100 units intramuscularly every 6-8 weeks 10/21/20   [provider]  cetirizine (ZYRTEC) 10 MG tablet Take 10 mg by mouth daily.    [provider]  Cholecalciferol (VITAMIN D) 125 MCG (5000 UT) CAPS Take 5,000 Units by mouth daily.    [provider]  divalproex (DEPAKOTE ER) 500 MG 24 hr tablet Take 1 tablet (500 mg total) by mouth daily. 10/18/22   Rai, Delene Ruffini, MD  furosemide (LASIX) 20 MG tablet Take 1 tablet (20 mg total) by mouth daily as needed. 01/03/23   Adela Glimpse, NP  gabapentin (NEURONTIN) 300 MG capsule Take 300 mg (1 tablet) in the morning and 600 mg (2 tablets) at bedtime 10/18/22   Rai, Delene Ruffini, MD  levothyroxine (SYNTHROID) 50 MCG tablet Take  1 tablet  Daily  on an empty stomach with only water for 30 minutes & no Antacid meds, Calcium or Magnesium for 4 hours & avoid Biotin                                                                                                                    /                                                                   TAKE  BY                                                 MOUTH 11/15/22   [name], MD  pantoprazole (PROTONIX) 40 MG tablet Take  1 tablet  2 x /day  for Acid Indigestion & Heartburn 02/01/22    [name], MD  Polyethyl Glycol-Propyl Glycol (SYSTANE ULTRA) 0.4-0.3 % SOLN Place 1 drop into both eyes 3 (three) times daily as needed (dry eyes).    [provider]  Probiotic Product (PROBIOTIC PO) Take 1 tablet by mouth daily.    [provider]  Propylene Glycol (SYSTANE BALANCE) 0.6 % SOLN Place 1 drop into both eyes at bedtime.    [provider]  zinc gluconate 50 MG tablet Take 50 mg by mouth daily.    [provider]    Physical Exam: BP (!) 153/109   Pulse (!) 120   Temp 98.5 F (36.9 C) (Oral)   Resp 16   Ht 5\' 2"  (1.575 m)   Wt 69.2 kg   SpO2 96%   BMI 27.89 kg/m   General:  Alert, oriented, calm, in no acute distress, looks very comfortable eating a sandwich, her husband is at the bedside Eyes: EOMI, clear conjuctivae, white sclerea Neck: supple, no masses, trachea mildline  Cardiovascular: Tachycardic and irregular, no murmurs or rubs, no peripheral edema  Respiratory: clear to auscultation bilaterally, no wheezes, no crackles  Abdomen: soft, nontender, nondistended, normal bowel tones heard  Skin: dry, no rashes  Musculoskeletal: no joint effusions, normal range of motion  Psychiatric: appropriate affect, normal speech  Neurologic: extraocular muscles intact, clear speech, moving all extremities with intact sensorium         Labs on Admission:  Basic Metabolic Panel: Recent Labs  Lab 01/03/23 1533 01/09/23 1535  NA 140 140  K 3.9 3.6  CL 104 106  CO2 29 25  GLUCOSE 96 113*  BUN 29* 36*  CREATININE 1.18* 1.40*  CALCIUM 9.8 9.6   Liver Function Tests: Recent Labs  Lab 01/03/23 1533 01/09/23 1535  AST 20 24  ALT 16 21  ALKPHOS  --  57  BILITOT 0.6 0.4  PROT 6.1 6.9  ALBUMIN  --  3.2*   No results for input(s): "LIPASE", "AMYLASE" in the last 168 hours. No results for input(s): "AMMONIA" in the last 168 hours. CBC: Recent Labs  Lab 01/03/23 1533 01/09/23 1416 01/09/23 1535  WBC 8.3 8.8 8.9   NEUTROABS 5,818 6.2 6.5  HGB 11.7 12.8 13.0  HCT 35.1 39.5 40.4  MCV 93.1 94.5 96.0  PLT 39* 62* 62*   Cardiac Enzymes: No results for input(s): "CKTOTAL", "CKMB", "CKMBINDEX", "TROPONINI" in the last 168 hours.  BNP (last 3 results) Recent Labs    01/09/23 1535  BNP 601.9*    ProBNP (last 3 results) No results for input(s): "PROBNP" in the last 8760 hours.  CBG: No results for input(s): "GLUCAP" in the last 168 hours.  Radiological Exams on Admission: DG Chest Port 1 View  Result Date: 01/09/2023 CLINICAL DATA:  Tachycardia and hypotension EXAM: PORTABLE CHEST 1 VIEW COMPARISON:  Chest radiograph dated 10/17/2022 FINDINGS: Normal lung volumes. Rounded retrocardiac opacity. No pleural effusion or pneumothorax. The heart size and mediastinal contours are within normal limits. No acute osseous abnormality. Right upper quadrant surgical clips. IMPRESSION: 1.  Rounded retrocardiac opacity, likely hiatal hernia. 2.  No focal consolidations. Electronically Signed   By: Agustin Cree M.D.   On: 01/09/2023 17:46    Assessment/Plan Jillian Rodgers is a 77 y.o. female with medical history significant for chronic thrombocytopenia, hyperlipidemia, hypothyroidism, CKD stage III, TIA on aspirin, paroxysmal atrial fibrillation not on anticoagulation admitted to the hospital with A-fib with RVR.   Atrial fibrillation with RVR-had prior history of paroxysmal atrial fibrillation in 2022 without inciting factors, status post successful DCCV at that time.  No anticoagulation due to thrombocytopenia, though recently placed on aspirin due to TIA in July 2024.  Currently atrial fibrillation may have been caused by her UTI. -Inpatient admission to stepdown -Continue Lopressor 12.5 mg p.o. twice daily -Due to continued symptoms and atrial fibrillation with RVR, will start Cardizem bolus and drip, titrate per protocol -Check TSH -Recent echo noted from 10/2022 -Recommend inpatient cardiology consultation in  the morning  UTI-without evidence of sepsis, however patient has had dysuria for over a week and have his abnormal urinalysis -Follow up urine culture -Empiric IV Rocephin  History of TIA-continue aspirin 81 mg p.o. daily  Hypothyroidism-check TSH, continue Synthroid  Hyperlipidemia-Lipitor  GERD-Protonix  DVT prophylaxis: SCDs only due to thrombocytopenia    Code Status: Full Code  Consults called: None  Admission status: The appropriate patient status for this patient is INPATIENT. Inpatient status is judged to be reasonable and necessary in order to provide the required intensity of service to ensure the patient's safety. The patient's presenting symptoms, physical exam findings, and initial radiographic and laboratory data in the context of their chronic comorbidities is felt to place them at high risk for further clinical deterioration. Furthermore, it is not anticipated that the patient will be medically stable for discharge from the hospital within 2 midnights of admission.    I certify that at the point of admission it is my clinical judgment that the patient will require inpatient hospital care spanning beyond 2 midnights from the point of admission due to high intensity of service, high risk for further deterioration and high frequency of surveillance required  Time spent: 58 minutes  Jahnae Mcadoo Sharlette Dense MD [facility] Pager [phone]  If 7PM-7AM, please contact night-coverage [email or web address] Password [facility]  01/09/2023, 8:03 PM

## 2023-01-09 NOTE — Progress Notes (Signed)
[facility] [phone]   [phone]  PROGRESS NOTE  Patient Care Team: [name], MD as PCP - General (Internal Medicine) Christell Constant, MD as PCP - Cardiology (Cardiology) Annie Sable, [name]  CHIEF COMPLAINTS/PURPOSE OF CONSULTATION:  Thrombocytopenia  HISTORY OF PRESENTING ILLNESS:  Jillian Rodgers 77 y.o. female returns for a follow up for thrombocytopenia.   Discussed the use of AI scribe software for clinical note transcription with the patient, who gave verbal consent to proceed.  History of Present Illness   The patient, with a history of thrombocytopenia, presents with a several week history of general malaise, dizziness, and lethargy. She reports feeling unwell, particularly in the last few days after receiving the COVID-19 and influenza vaccines. However, she notes that these symptoms were present prior to the vaccinations. She also reports occasional mild dysuria and diarrhea today. She has been largely bedridden due to her symptoms, and has experienced near syncope when ambulating to the bathroom. She denies cough, hemoptysis, hematochezia, and melena. She has been on a stable medication regimen, with the recent addition of Depakote for an unspecified indication.      Rest of the pertinent 10 point ROS reviewed and negative  MEDICAL HISTORY:  Past Medical History:  Diagnosis Date   Anemia with low platelet count (HCC)    Chronic kidney disease    Degenerative joint disease    Dysrhythmia    a fib   GERD (gastroesophageal reflux disease)    History of kidney stones    Hyperlipidemia    Hypothyroidism    Inguinal hernia 02/19/2019   Labile hypertension    Migraines    neurontin helps   Pneumonia    hx of walking pneumonia    Primary hypertension 04/28/2022    SURGICAL HISTORY: Past Surgical History:  Procedure Laterality Date   BILATERAL CATARACT SURGERY       BLERPHOROPLASTY \     CHOLECYSTECTOMY  1996   open   COLONOSCOPY  2009   recommended 10 year f/u   HERNIA REPAIR  1985   umb hernia rpr   HERNIA REPAIR  2005 & 2009   ventral hernia rpr   INGUINAL HERNIA REPAIR Left 04/30/2020   Procedure: OPEN LEFT INGUINAL HERNIA REPAIR WITH MESH;  Surgeon: Berna Bue, MD;  Location: [facility];  Service: General;  Laterality: Left;   INGUINAL HERNIA REPAIR Right 10/24/2021   Procedure: OPEN RIGHT INGUINAL HERNIA REPAIR WITH MESH ;  Surgeon: Berna Bue, MD;  Location: [facility];  Service: General;  Laterality: Right;   PILONIDAL CYST EXCISION  1972   SKIN CANCER REMOVAL      TONSILLECTOMY     UPPER GI ENDOSCOPY     VENTRAL HERNIA REPAIR      SOCIAL HISTORY: Social History   Socioeconomic History   Marital status: Married    Spouse name: Not on file   Number of children: 2   Years of education: 18   Highest education level: Not on file  Occupational History   Not on file  Tobacco Use   Smoking status: Never   Smokeless tobacco: Never  Vaping Use   Vaping status: Never Used  Substance and Sexual Activity   Alcohol use: No   Drug use: No   Sexual activity: Yes  Other Topics Concern   Not on file  Social History Narrative   Not on file   Social Determinants of Health   Financial  Resource Strain: Not on file  Food Insecurity: No Food Insecurity (10/17/2022)   Hunger Vital Sign    Worried About Running Out of Food in the Last Year: Never true    Ran Out of Food in the Last Year: Never true  Transportation Needs: No Transportation Needs (10/17/2022)   PRAPARE - Administrator, Civil Service (Medical): No    Lack of Transportation (Non-Medical): No  Physical Activity: Not on file  Stress: Not on file  Social Connections: Not on file  Intimate Partner Violence: Not At Risk (10/17/2022)   Humiliation, Afraid, Rape, and Kick questionnaire    Fear of Current or Ex-Partner: No    Emotionally Abused: No    Physically  Abused: No    Sexually Abused: No    FAMILY HISTORY: Family History  Problem Relation Age of Onset   Ovarian cancer Mother    Heart disease Mother    Cancer Mother    Uterine cancer Mother    Hypertension Mother    Breast cancer Sister    Stroke Paternal Uncle    Congestive Heart Failure Father    Gout Maternal Aunt    Colon cancer Neg Hx    Esophageal cancer Neg Hx    Stomach cancer Neg Hx    Rectal cancer Neg Hx     ALLERGIES:  has No Known Allergies.  MEDICATIONS:  Current Outpatient Medications  Medication Sig Dispense Refill   acetaminophen (TYLENOL) 325 MG tablet Take 650 mg by mouth every 6 (six) hours as needed for moderate pain or headache.     Ascorbic Acid (VITAMIN C) 500 MG CAPS Take 500 mg by mouth daily.      aspirin EC 81 MG tablet Take 1 tablet (81 mg total) by mouth daily. Swallow whole. 30 tablet 12   atorvastatin (LIPITOR) 80 MG tablet Take 1 tablet (80 mg total) by mouth daily. 30 tablet 3   BOTOX 100 units SOLR injection Inject 100 Units into the muscle See admin instructions. Inject 100 units intramuscularly every 6-8 weeks     cetirizine (ZYRTEC) 10 MG tablet Take 10 mg by mouth daily.     Cholecalciferol (VITAMIN D) 125 MCG (5000 UT) CAPS Take 5,000 Units by mouth daily.     divalproex (DEPAKOTE ER) 500 MG 24 hr tablet Take 1 tablet (500 mg total) by mouth daily. 30 tablet 3   furosemide (LASIX) 20 MG tablet Take 1 tablet (20 mg total) by mouth daily as needed. 30 tablet 2   gabapentin (NEURONTIN) 300 MG capsule Take 300 mg (1 tablet) in the morning and 600 mg (2 tablets) at bedtime 90 capsule 3   levothyroxine (SYNTHROID) 50 MCG tablet Take  1 tablet  Daily  on an empty stomach with only water for 30 minutes & no Antacid meds, Calcium or Magnesium for 4 hours & avoid Biotin                                                                                                                    /  TAKE                                         BY                                                 MOUTH 90 tablet 3   pantoprazole (PROTONIX) 40 MG tablet Take  1 tablet  2 x /day  for Acid Indigestion & Heartburn 180 tablet 3   Polyethyl Glycol-Propyl Glycol (SYSTANE ULTRA) 0.4-0.3 % SOLN Place 1 drop into both eyes 3 (three) times daily as needed (dry eyes).     Probiotic Product (PROBIOTIC PO) Take 1 tablet by mouth daily.     Propylene Glycol (SYSTANE BALANCE) 0.6 % SOLN Place 1 drop into both eyes at bedtime.     zinc gluconate 50 MG tablet Take 50 mg by mouth daily.     No current facility-administered medications for this visit.    REVIEW OF SYSTEMS:   Constitutional: ( - ) fevers, ( - )  chills , ( - ) night sweats Eyes: ( - ) blurriness of vision, ( - ) double vision, ( - ) watery eyes Ears, nose, mouth, throat, and face: ( - ) mucositis, ( - ) sore throat Respiratory: ( - ) cough, ( - ) dyspnea, ( - ) wheezes Cardiovascular: ( - ) palpitation, ( - ) chest discomfort, ( - ) lower extremity swelling Gastrointestinal:  ( - ) nausea, ( - ) heartburn, ( - ) change in bowel habits Skin: ( - ) abnormal skin rashes Lymphatics: ( - ) new lymphadenopathy, ( - ) easy bruising Neurological: ( - ) numbness, ( - ) tingling, ( - ) new weaknesses Behavioral/Psych: ( - ) mood change, ( - ) new changes  All other systems were reviewed with the patient and are negative.  PHYSICAL EXAMINATION: ECOG PERFORMANCE STATUS: 0 - Asymptomatic  Vitals:   01/09/23 1446  BP: (!) 88/66  Pulse: (!) 121  Resp: 17  Temp: 98.4 F (36.9 C)  SpO2: 96%   Filed Weights   01/09/23 1446  Weight: 152 lb 8 oz (69.2 kg)    Physical Exam Constitutional:      Appearance: Normal appearance.  Cardiovascular:     Rate and Rhythm: Normal rate and regular rhythm.     Pulses: Normal pulses.     Heart sounds: Normal heart sounds.  Pulmonary:     Effort: Pulmonary effort is normal.     Breath sounds: Normal breath  sounds.  Abdominal:     General: Abdomen is flat. Bowel sounds are normal.     Palpations: Abdomen is soft.  Musculoskeletal:        General: No swelling. Normal range of motion.     Cervical back: Normal range of motion and neck supple. No rigidity.  Lymphadenopathy:     Cervical: No cervical adenopathy.  Skin:    General: Skin is warm and dry.  Neurological:     General: No focal deficit present.     Mental Status: She is alert.      LABORATORY DATA:  I have reviewed the data as listed    Latest Ref Rng & Units 01/09/2023    2:16 PM 01/03/2023  3:33 PM 10/18/2022    4:43 AM  CBC  WBC 4.0 - 10.5 K/uL 8.8  8.3  5.3   Hemoglobin 12.0 - 15.0 g/dL 47.8  29.5  62.1   Hematocrit 36.0 - 46.0 % 39.5  35.1  39.5   Platelets 150 - 400 K/uL 62  39  41        Latest Ref Rng & Units 01/03/2023    3:33 PM 10/18/2022    4:43 AM 10/17/2022    9:00 PM  CMP  Glucose 65 - 99 mg/dL 96  90  308   BUN 7 - 25 mg/dL 29  18  23    Creatinine 0.60 - 1.00 mg/dL 6.57  8.46  9.62   Sodium 135 - 146 mmol/L 140  138  139   Potassium 3.5 - 5.3 mmol/L 3.9  3.8  4.4   Chloride 98 - 110 mmol/L 104  104  104   CO2 20 - 32 mmol/L 29  26  26    Calcium 8.6 - 10.4 mg/dL 9.8  9.8  95.2   Total Protein 6.1 - 8.1 g/dL 6.1     Total Bilirubin 0.2 - 1.2 mg/dL 0.6     AST 10 - 35 U/L 20     ALT 6 - 29 U/L 16       ASSESSMENT & PLAN LESHLY MANN is a 77 y.o. female who presents for a follow up for thrombocytopenia.   #Thrombocytopenia:  --Patient has undergone serologic workup on November 2021 without no evidence of hepatitis, hypothyroidism, nutritional deficiencies.  --MR of the abdomen from May 2021 shows no evidence of liver disease or splenomegaly.  --Bone marrow biopsy from 03/19/2020 showed hypercellularity, no evidence of dyspoeisis, megakaryocytic hyperplasia.  --Flow cytometry negative for any monoclonal B cell population. Cytogenetics was normal.  --Likely etiology is immune mediated versus  early bone marrow disorder.  -- her thrombocytopenia has remained stable overall.  General Malaise Patient presents with lethargy, dizziness, and general malaise since September 15th. Recent vaccinations (COVID-19 and flu) unlikely to be the cause. Mild urinary symptoms reported. No specific symptoms pointing to a particular diagnosis. Blood work shows normal white blood cell count and hemoglobin. Platelets stable at 62,000 (similar to March). -Admission to hospital for further investigation and management due to severity of symptoms and inability to investigate in clinic setting.  Tachycardia and Hypotension Tachycardia (heart rate 120-130) and hypotension observed. Possible dehydration or active infection. -Management to be determined in hospital setting.  Medication Changes Recent addition of Depakote, reason unclear. -Review medication changes and indications in hospital setting.  Planned Out-of-Town Trip Patient has plans to travel but is currently unable to do so due to health status. -Advise postponement of travel until health improves.  No orders of the defined types were placed in this encounter.  All questions were answered. The patient knows to call the clinic with any problems, questions or concerns.  I have spent a total of 30  minutes minutes of face-to-face and non-face-to-face time, preparing to see the patient, performing a medically appropriate examination, counseling and educating the patient, documenting clinical information in the electronic health record, and care coordination.   Rachel Moulds MD

## 2023-01-09 NOTE — ED Notes (Signed)
Lab called to add on labs

## 2023-01-09 NOTE — ED Triage Notes (Signed)
Patient reports brought over from the hematology clinic with tachycardia and hypotension. HR at clinic was 121 and BP 88/66. On arrival patient's HR was 144 and BP 143/104. Patient reports weakness x "a couple weeks" and burning with urination.

## 2023-01-09 NOTE — ED Provider Notes (Signed)
[name]   CSN: [id number] Arrival date & time: 01/09/23  1520     History {Add pertinent medical, surgical, social history, OB history to HPI:1} No chief complaint on file.   Jillian Rodgers is a 77 y.o. female.  HPI 77 year old female history of GERD, hypertension, hyperlipidemia, hypothyroidism, thrombocytopenia presenting for tachycardia.  Patient states she has not felt well for last 2 weeks.  She is felt generalized fatigue, dizziness, presyncope.  She has had some mild headaches similar to prior.  She has had decreased appetite.  Today she had some nonbloody diarrhea.  She went to see her oncologist today who noted she was hypotensive and tachycardic so sent her to the ED for evaluation.  Per note it appears they suspect her thrombocytopenia is due to autoimmune process or early bone marrow disorder.  She is not some recent bruising but no bleeding.  No blood in her stools.  She is not any vomiting.  No abdominal pain.  Occasional chest pressure in the last couple days, no shortness of breath.  She has had poor appetite.  No change in urination.  She states she had history of A-fib about 2 years ago that required cardioversion.  She is not on anticoagulation due to her thrombocytopenia but is on aspirin.     Home Medications Prior to Admission medications   Medication Sig Start Date End Date Taking? Authorizing Provider  acetaminophen (TYLENOL) 325 MG tablet Take 650 mg by mouth every 6 (six) hours as needed for moderate pain or headache.    [provider]  Ascorbic Acid (VITAMIN C) 500 MG CAPS Take 500 mg by mouth daily.     [provider]  aspirin EC 81 MG tablet Take 1 tablet (81 mg total) by mouth daily. Swallow whole. 10/19/22   Rai, Delene Ruffini, MD  atorvastatin (LIPITOR) 80 MG tablet Take 1 tablet (80 mg total) by mouth daily. 10/19/22   Rai, Ripudeep Kirtland Bouchard, MD  BOTOX 100 units SOLR injection Inject  100 Units into the muscle See admin instructions. Inject 100 units intramuscularly every 6-8 weeks 10/21/20   [provider]  cetirizine (ZYRTEC) 10 MG tablet Take 10 mg by mouth daily.    [provider]  Cholecalciferol (VITAMIN D) 125 MCG (5000 UT) CAPS Take 5,000 Units by mouth daily.    [provider]  divalproex (DEPAKOTE ER) 500 MG 24 hr tablet Take 1 tablet (500 mg total) by mouth daily. 10/18/22   Rai, Delene Ruffini, MD  furosemide (LASIX) 20 MG tablet Take 1 tablet (20 mg total) by mouth daily as needed. 01/03/23   Adela Glimpse, NP  gabapentin (NEURONTIN) 300 MG capsule Take 300 mg (1 tablet) in the morning and 600 mg (2 tablets) at bedtime 10/18/22   Rai, Delene Ruffini, MD  levothyroxine (SYNTHROID) 50 MCG tablet Take  1 tablet  Daily  on an empty stomach with only water for 30 minutes & no Antacid meds, Calcium or Magnesium for 4 hours & avoid Biotin                                                                                                                    /  TAKE                                         BY                                                 MOUTH 11/15/22   [name], MD  pantoprazole (PROTONIX) 40 MG tablet Take  1 tablet  2 x /day  for Acid Indigestion & Heartburn 02/01/22   [name], MD  Polyethyl Glycol-Propyl Glycol (SYSTANE ULTRA) 0.4-0.3 % SOLN Place 1 drop into both eyes 3 (three) times daily as needed (dry eyes).    [provider]  Probiotic Product (PROBIOTIC PO) Take 1 tablet by mouth daily.    [provider]  Propylene Glycol (SYSTANE BALANCE) 0.6 % SOLN Place 1 drop into both eyes at bedtime.    [provider]  zinc gluconate 50 MG tablet Take 50 mg by mouth daily.    [provider]      Allergies    Patient has no known allergies.    Review of Systems   Review of Systems Review of systems completed and notable as per  HPI.  ROS otherwise negative.   Physical Exam Updated Vital Signs There were no vitals taken for this visit. Physical Exam Vitals and nursing note reviewed.  Constitutional:      General: She is not in acute distress.    Appearance: She is well-developed.  HENT:     Head: Normocephalic and atraumatic.     Mouth/Throat:     Mouth: Mucous membranes are moist.     Pharynx: Oropharynx is clear.  Eyes:     Extraocular Movements: Extraocular movements intact.     Conjunctiva/sclera: Conjunctivae normal.     Pupils: Pupils are equal, round, and reactive to light.  Cardiovascular:     Rate and Rhythm: Tachycardia present. Rhythm irregular.     Pulses: Normal pulses.     Heart sounds: Normal heart sounds. No murmur heard. Pulmonary:     Effort: Pulmonary effort is normal. No respiratory distress.     Breath sounds: Normal breath sounds.  Abdominal:     Palpations: Abdomen is soft.     Tenderness: There is no abdominal tenderness. There is no guarding or rebound.  Musculoskeletal:        General: No swelling.     Cervical back: Neck supple.     Right lower leg: No edema.     Left lower leg: No edema.  Skin:    General: Skin is warm and dry.     Capillary Refill: Capillary refill takes less than 2 seconds.  Neurological:     General: No focal deficit present.     Mental Status: She is alert and oriented to person, place, and time. Mental status is at baseline.  Psychiatric:        Mood and Affect: Mood normal.     ED Results / Procedures / Treatments   Labs (all labs ordered are listed, but only abnormal results are displayed) Labs Reviewed - No data to display  EKG None  Radiology No results found.  Procedures Procedures  {Document cardiac monitor, telemetry assessment procedure when appropriate:1}  Medications Ordered in ED Medications -  No data to display  ED Course/ Medical Decision Making/ A&P   {   Click here for ABCD2, HEART and other calculatorsREFRESH  Note before signing :1}                              Medical Decision Making  Medical Decision Making:   Jillian Rodgers is a 77 y.o. female who presented to the ED today with tachycardia, fatigue, presyncope.  On exam she is in atrial fibrillation with RVR.  Blood pressure here is normal.  She reports 2 weeks of symptoms, likely related to recurrent A-fib.  She is not on home rate control or anticoagulation.  She reports of chest pressure last couple days, obtain EKG, troponin.  She is history of significant thrombocytopenia, consider symptomatic anemia as well.  No recent bleeding.  No head trauma.  She has not febrile, lower special for acute infection.  She has had some nonbloody diarrhea which could be leading to some dehydration.  I performed a bedside ultrasound of her heart, showed good EF, diminished IVC, no pericardial effusion.  Will trial IV fluids to see if this helps with her heart rate and then consider rate control.   {crccomplexity:27900} Reviewed and confirmed nursing documentation for past medical history, family history, social history.  Reassessment and Plan:   ***    Patient's presentation is most consistent with {EM COPA:27473}     {Document critical care time when appropriate:1} {Document review of labs and clinical decision tools ie heart score, Chads2Vasc2 etc:1}  {Document your independent review of radiology images, and any outside records:1} {Document your discussion with family members, caretakers, and with consultants:1} {Document social determinants of health affecting pt's care:1} {Document your decision making why or why not admission, treatments were needed:1} Final Clinical Impression(s) / ED Diagnoses Final diagnoses:  None    Rx / DC Orders ED Discharge Orders     None

## 2023-01-10 ENCOUNTER — Ambulatory Visit: Payer: Medicare PPO

## 2023-01-10 DIAGNOSIS — Z8673 Personal history of transient ischemic attack (TIA), and cerebral infarction without residual deficits: Secondary | ICD-10-CM | POA: Diagnosis not present

## 2023-01-10 DIAGNOSIS — D696 Thrombocytopenia, unspecified: Secondary | ICD-10-CM

## 2023-01-10 DIAGNOSIS — N1831 Chronic kidney disease, stage 3a: Secondary | ICD-10-CM | POA: Diagnosis not present

## 2023-01-10 DIAGNOSIS — N3000 Acute cystitis without hematuria: Secondary | ICD-10-CM

## 2023-01-10 DIAGNOSIS — E782 Mixed hyperlipidemia: Secondary | ICD-10-CM

## 2023-01-10 DIAGNOSIS — I4891 Unspecified atrial fibrillation: Secondary | ICD-10-CM | POA: Diagnosis not present

## 2023-01-10 DIAGNOSIS — I10 Essential (primary) hypertension: Secondary | ICD-10-CM

## 2023-01-10 LAB — CBC
HCT: 37.3 % (ref 36.0–46.0)
Hemoglobin: 11.8 g/dL — ABNORMAL LOW (ref 12.0–15.0)
MCH: 30.3 pg (ref 26.0–34.0)
MCHC: 31.6 g/dL (ref 30.0–36.0)
MCV: 95.6 fL (ref 80.0–100.0)
Platelets: 68 10*3/uL — ABNORMAL LOW (ref 150–400)
RBC: 3.9 MIL/uL (ref 3.87–5.11)
RDW: 13.6 % (ref 11.5–15.5)
WBC: 8.8 10*3/uL (ref 4.0–10.5)
nRBC: 0 % (ref 0.0–0.2)

## 2023-01-10 LAB — BASIC METABOLIC PANEL
Anion gap: 12 (ref 5–15)
BUN: 28 mg/dL — ABNORMAL HIGH (ref 8–23)
CO2: 21 mmol/L — ABNORMAL LOW (ref 22–32)
Calcium: 9.2 mg/dL (ref 8.9–10.3)
Chloride: 106 mmol/L (ref 98–111)
Creatinine, Ser: 1.04 mg/dL — ABNORMAL HIGH (ref 0.44–1.00)
GFR, Estimated: 55 mL/min — ABNORMAL LOW (ref >60)
Glucose, Bld: 98 mg/dL (ref 70–99)
Potassium: 3.5 mmol/L (ref 3.5–5.1)
Sodium: 139 mmol/L (ref 135–145)

## 2023-01-10 LAB — TSH: TSH: 5.083 u[IU]/mL — ABNORMAL HIGH (ref 0.350–4.500)

## 2023-01-10 LAB — MRSA NEXT GEN BY PCR, NASAL: MRSA by PCR Next Gen: NOT DETECTED

## 2023-01-10 MED ORDER — DIVALPROEX SODIUM ER 500 MG PO TB24
500.0000 mg | ORAL_TABLET | Freq: Every day | ORAL | Status: DC
  Administered 2023-01-10 – 2023-01-13 (×4): 500 mg via ORAL
  Filled 2023-01-10 (×3): qty 2
  Filled 2023-01-10: qty 1

## 2023-01-10 MED ORDER — POLYVINYL ALCOHOL 1.4 % OP SOLN
1.0000 [drp] | Freq: Two times a day (BID) | OPHTHALMIC | Status: DC
  Administered 2023-01-13: 1 [drp] via OPHTHALMIC
  Filled 2023-01-10: qty 15

## 2023-01-10 MED ORDER — HEPARIN BOLUS VIA INFUSION
3000.0000 [IU] | Freq: Once | INTRAVENOUS | Status: AC
  Administered 2023-01-10: 3000 [IU] via INTRAVENOUS
  Filled 2023-01-10: qty 3000

## 2023-01-10 MED ORDER — HEPARIN (PORCINE) 25000 UT/250ML-% IV SOLN
950.0000 [IU]/h | INTRAVENOUS | Status: DC
  Administered 2023-01-10 – 2023-01-11 (×2): 950 [IU]/h via INTRAVENOUS
  Filled 2023-01-10 (×2): qty 250

## 2023-01-10 MED ORDER — POLYETHYL GLYCOL-PROPYL GLYCOL 0.4-0.3 % OP SOLN: 1.0000 [drp] | Freq: Every morning | OPHTHALMIC | Status: DC

## 2023-01-10 MED ORDER — METOPROLOL TARTRATE 25 MG PO TABS
25.0000 mg | ORAL_TABLET | Freq: Two times a day (BID) | ORAL | Status: DC
  Administered 2023-01-10: 25 mg via ORAL
  Filled 2023-01-10: qty 1

## 2023-01-10 MED ORDER — GABAPENTIN 300 MG PO CAPS: 300.0000 mg | ORAL_CAPSULE | ORAL | Status: DC

## 2023-01-10 MED ORDER — POTASSIUM CHLORIDE CRYS ER 20 MEQ PO TBCR
40.0000 meq | EXTENDED_RELEASE_TABLET | Freq: Once | ORAL | Status: AC
  Administered 2023-01-10: 40 meq via ORAL
  Filled 2023-01-10: qty 2

## 2023-01-10 MED ORDER — CHLORHEXIDINE GLUCONATE CLOTH 2 % EX PADS
6.0000 | MEDICATED_PAD | Freq: Every day | CUTANEOUS | Status: DC
  Administered 2023-01-10 – 2023-01-12 (×2): 6 via TOPICAL

## 2023-01-10 MED ORDER — GABAPENTIN 300 MG PO CAPS
300.0000 mg | ORAL_CAPSULE | Freq: Every day | ORAL | Status: DC
  Administered 2023-01-10 – 2023-01-13 (×4): 300 mg via ORAL
  Filled 2023-01-10 (×4): qty 1

## 2023-01-10 MED ORDER — ORAL CARE MOUTH RINSE: 15.0000 mL | OROMUCOSAL | Status: DC | PRN

## 2023-01-10 MED ORDER — GABAPENTIN 300 MG PO CAPS
600.0000 mg | ORAL_CAPSULE | Freq: Every day | ORAL | Status: DC
  Administered 2023-01-10 – 2023-01-12 (×3): 600 mg via ORAL
  Filled 2023-01-10 (×3): qty 2

## 2023-01-10 MED ORDER — METOPROLOL TARTRATE 25 MG PO TABS
25.0000 mg | ORAL_TABLET | Freq: Three times a day (TID) | ORAL | Status: DC
  Administered 2023-01-10 – 2023-01-11 (×3): 25 mg via ORAL
  Filled 2023-01-10 (×3): qty 1

## 2023-01-10 MED ORDER — PROPYLENE GLYCOL 0.6 % OP SOLN: 1.0000 [drp] | Freq: Every day | OPHTHALMIC | Status: DC

## 2023-01-10 MED ORDER — FUROSEMIDE 10 MG/ML IJ SOLN
40.0000 mg | Freq: Once | INTRAMUSCULAR | Status: AC
  Administered 2023-01-10: 40 mg via INTRAVENOUS
  Filled 2023-01-10: qty 4

## 2023-01-10 NOTE — ED Notes (Addendum)
IV fluids stopped per HCP order, BP trending up, Cardizem titrated up to 15g

## 2023-01-10 NOTE — Assessment & Plan Note (Signed)
Stable

## 2023-01-10 NOTE — Hospital Course (Addendum)
HPI: KOSISOCHUKWU GOLDBERG is a 77 y.o. female with medical history significant for chronic thrombocytopenia, hyperlipidemia, hypothyroidism, CKD stage III, TIA on aspirin, paroxysmal atrial fibrillation not on anticoagulation admitted to the hospital with A-fib with RVR.  She underwent successful DCCV on 10/25/2020 and at the time was on Eliquis, she wore a cardiac monitor later that year and was taken off Eliquis due to no further atrial fibrillation and chronic thrombocytopenia for which she is followed by hematology.  She had no known recurrence of atrial fibrillation, was hospitalized in July for TIA and since then has been on aspirin.  She had unremarkable echocardiography during that hospital stay than grade 1 diastolic dysfunction.  For the last week or so, she has been having burning with urination, and for the last couple of weeks she has been lethargic, feeling dizzy and lightheaded with exertion (husband at bedside states that she has sometimes been dizzy and lightheaded even when lying in bed), has not felt any palpitations, or chest pain.  When she had atrial fibrillation back in 2022 she had palpitations.  This time is different.  She had a follow-up appointment with her hematologist, who noted that the patient was tachycardic, and mentioned her symptoms.  She was sent to the ER for evaluation.   Significant Events: Admitted 01/09/2023 for rapid afib and UTI 01-09-2023 started on IV cardizem gtts. Had to stop IV cardizem due to low BP 01-11-2023 due to persistent rapid afib, cardiology increased lopressor dose to 50 mg bid 01-12-2023 HR still in the 130s.  Cardizem 30 mg q6h ordered.  pt converted to NSR after 1 dose of PO Cardizem. Had already been on lopressor 50 mg bid  Significant Labs: Admitting urine cx growing GNR Final urine culture proteus mirabilis. Sensitive to rocephin.  Significant Imaging Studies: 01-12-2023 echo shows LVEF 60%. Left atrium severely dilated.  Antibiotic  Therapy: Anti-infectives (From admission, onward)    Start     Dose/Rate Route Frequency Ordered Stop   01/10/23 1830  cefTRIAXone (ROCEPHIN) 1 g in sodium chloride 0.9 % 100 mL IVPB  Status:  Discontinued        1 g 200 mL/hr over 30 Minutes Intravenous Every 24 hours 01/09/23 2103 01/12/23 1256   01/09/23 1815  cefTRIAXone (ROCEPHIN) 1 g in sodium chloride 0.9 % 100 mL IVPB        1 g 200 mL/hr over 30 Minutes Intravenous  Once 01/09/23 1814 01/09/23 2058       Procedures:   Consultants: Cardiology

## 2023-01-10 NOTE — Plan of Care (Signed)
  Problem: Coping: Goal: Level of anxiety will decrease Outcome: Progressing   Problem: Pain Managment: Goal: General experience of comfort will improve Outcome: Progressing   Problem: Safety: Goal: Ability to remain free from injury will improve Outcome: Progressing   

## 2023-01-10 NOTE — Progress Notes (Addendum)
PROGRESS NOTE    Jillian Rodgers  WGN:[id number] DOB: 1945-12-24 DOA: 01/09/2023 PCP: [name], MD  Subjective: Pt seen and examined. Placed on IV cardizem gtts due to rapid afib not controlled with po lopressor. Cardiology consulted. No chest pain. No SOB.   Hospital Course: HPI: Jillian Rodgers is a 77 y.o. female with medical history significant for chronic thrombocytopenia, hyperlipidemia, hypothyroidism, CKD stage III, TIA on aspirin, paroxysmal atrial fibrillation not on anticoagulation admitted to the hospital with A-fib with RVR.  She underwent successful DCCV on 10/25/2020 and at the time was on Eliquis, she wore a cardiac monitor later that year and was taken off Eliquis due to no further atrial fibrillation and chronic thrombocytopenia for which she is followed by hematology.  She had no known recurrence of atrial fibrillation, was hospitalized in July for TIA and since then has been on aspirin.  She had unremarkable echocardiography during that hospital stay than grade 1 diastolic dysfunction.  For the last week or so, she has been having burning with urination, and for the last couple of weeks she has been lethargic, feeling dizzy and lightheaded with exertion (husband at bedside states that she has sometimes been dizzy and lightheaded even when lying in bed), has not felt any palpitations, or chest pain.  When she had atrial fibrillation back in 2022 she had palpitations.  This time is different.  She had a follow-up appointment with her hematologist, who noted that the patient was tachycardic, and mentioned her symptoms.  She was sent to the ER for evaluation.   Significant Events: Admitted 01/09/2023 for rapid afib and UTI   Significant Labs:   Significant Imaging Studies:   Antibiotic Therapy: Anti-infectives (From admission, onward)    Start     Dose/Rate Route Frequency Ordered Stop   01/10/23 1830  cefTRIAXone (ROCEPHIN) 1 g in sodium chloride 0.9 % 100 mL  IVPB        1 g 200 mL/hr over 30 Minutes Intravenous Every 24 hours 01/09/23 2103     01/09/23 1815  cefTRIAXone (ROCEPHIN) 1 g in sodium chloride 0.9 % 100 mL IVPB        1 g 200 mL/hr over 30 Minutes Intravenous  Once 01/09/23 1814 01/09/23 2058       Procedures:   Consultants: Cardiology    Assessment and Plan: * Atrial fibrillation with RVR (HCC) Admitted for rapid afib. Started on IV cardizem. Cardiology consulted. Systemic anticoagulation going to be tricky due to pt's hx of thrombocytopenia. Cards going to ask hematology about this. Had echo in 10-2022 for her TIA workup that showed normal LVEF.  Acute cystitis without hematuria On IV rocephin. Urine cx growing GNR. Continue IV Abx.  Thrombocytopenia (HCC) Chronic. Follows with heme/onc.  Primary hypertension On IV cardizem and po lopressor.  CKD stage 3a, GFR 45-59 ml/min (HCC) - baseline SCr 1.1-1.4 Stable.  Hypothyroidism Stable. On synthroid 50 mg daily.  Hyperlipidemia On lipitor 80 mg daily.   DVT prophylaxis: SCDs Start: 01/09/23 2001     Code Status: Full Code Family Communication: no family at bedside Disposition Plan: return home Reason for continuing need for hospitalization: needs continued rate control with IV cardizem gtts.  Objective: Vitals:   01/10/23 1430 01/10/23 1445 01/10/23 1456 01/10/23 1546  BP: 102/82 98/78    Pulse: 89 (!) 106    Resp: 18 18    Temp:   98 F (36.7 C)   TempSrc:   Oral   SpO2: 96%  91%    Weight:    70.7 kg  Height:    5\' 4"  (1.626 m)    Intake/Output Summary (Last 24 hours) at 01/10/2023 1550 Last data filed at 01/10/2023 0809 Gross per 24 hour  Intake 2435.95 ml  Output --  Net 2435.95 ml   Filed Weights   01/09/23 1542 01/10/23 1546  Weight: 69.2 kg 70.7 kg    Examination:  Physical Exam Vitals and nursing note reviewed.  Constitutional:      General: She is not in acute distress.    Appearance: Normal appearance. She is not  toxic-appearing or diaphoretic.  HENT:     Head: Normocephalic and atraumatic.  Eyes:     General: No scleral icterus. Cardiovascular:     Rate and Rhythm: Normal rate. Rhythm irregular.  Pulmonary:     Effort: Pulmonary effort is normal. No respiratory distress.     Breath sounds: Normal breath sounds.  Abdominal:     General: Abdomen is flat. Bowel sounds are normal. There is no distension.     Palpations: Abdomen is soft.  Musculoskeletal:     Right lower leg: No edema.     Left lower leg: No edema.  Skin:    General: Skin is warm and dry.     Capillary Refill: Capillary refill takes less than 2 seconds.  Neurological:     General: No focal deficit present.     Mental Status: She is alert and oriented to person, place, and time.     Data Reviewed: I have personally reviewed following labs and imaging studies  CBC: Recent Labs  Lab 01/09/23 1416 01/09/23 1535 01/10/23 0527  WBC 8.8 8.9 8.8  NEUTROABS 6.2 6.5  --   HGB 12.8 13.0 11.8*  HCT 39.5 40.4 37.3  MCV 94.5 96.0 95.6  PLT 62* 62* 68*   Basic Metabolic Panel: Recent Labs  Lab 01/09/23 1535 01/10/23 0527  NA 140 139  K 3.6 3.5  CL 106 106  CO2 25 21*  GLUCOSE 113* 98  BUN 36* 28*  CREATININE 1.40* 1.04*  CALCIUM 9.6 9.2   GFR: Estimated Creatinine Clearance: 43 mL/min (A) (by C-G formula based on SCr of 1.04 mg/dL (H)). Liver Function Tests: Recent Labs  Lab 01/09/23 1535  AST 24  ALT 21  ALKPHOS 57  BILITOT 0.4  PROT 6.9  ALBUMIN 3.2*   Coagulation Profile: Recent Labs  Lab 01/09/23 1535  INR 0.9   Thyroid Function Tests: Recent Labs    01/09/23 1535  TSH 5.083*   Sepsis Labs: Recent Labs  Lab 01/09/23 1630  LATICACIDVEN 1.3    Recent Results (from the past 240 hour(s))  Resp panel by RT-PCR (RSV, Flu A&B, Covid) Anterior Nasal Swab     Status: None   Collection Time: 01/09/23  4:20 PM   Specimen: Anterior Nasal Swab  Result Value Ref Range Status   SARS Coronavirus 2  by RT PCR NEGATIVE NEGATIVE Final    Comment: (NOTE) SARS-CoV-2 target nucleic acids are NOT DETECTED.  The SARS-CoV-2 RNA is generally detectable in upper respiratory specimens during the acute phase of infection. The lowest concentration of SARS-CoV-2 viral copies this assay can detect is 138 copies/mL. A negative result does not preclude SARS-Cov-2 infection and should not be used as the sole basis for treatment or other patient management decisions. A negative result may occur with  improper specimen collection/handling, submission of specimen other than nasopharyngeal swab, presence of viral mutation(s) within the areas  targeted by this assay, and inadequate number of viral copies(<138 copies/mL). A negative result must be combined with clinical observations, patient history, and epidemiological information. The expected result is Negative.  Fact Sheet for Patients:  BloggerCourse.com  Fact Sheet for Healthcare Providers:  SeriousBroker.it  This test is no t yet approved or cleared by the [place] FDA and  has been authorized for detection and/or diagnosis of SARS-CoV-2 by FDA under an Emergency Use Authorization (EUA). This EUA will remain  in effect (meaning this test can be used) for the duration of the COVID-19 declaration under Section 564(b)(1) of the Act, 21 U.S.C.section 360bbb-3(b)(1), unless the authorization is terminated  or revoked sooner.       Influenza A by PCR NEGATIVE NEGATIVE Final   Influenza B by PCR NEGATIVE NEGATIVE Final    Comment: (NOTE) The Xpert Xpress SARS-CoV-2/FLU/RSV plus assay is intended as an aid in the diagnosis of influenza from Nasopharyngeal swab specimens and should not be used as a sole basis for treatment. Nasal washings and aspirates are unacceptable for Xpert Xpress SARS-CoV-2/FLU/RSV testing.  Fact Sheet for Patients: BloggerCourse.com  Fact  Sheet for Healthcare Providers: SeriousBroker.it  This test is not yet approved or cleared by the [place] FDA and has been authorized for detection and/or diagnosis of SARS-CoV-2 by FDA under an Emergency Use Authorization (EUA). This EUA will remain in effect (meaning this test can be used) for the duration of the COVID-19 declaration under Section 564(b)(1) of the Act, 21 U.S.C. section 360bbb-3(b)(1), unless the authorization is terminated or revoked.     Resp Syncytial Virus by PCR NEGATIVE NEGATIVE Final    Comment: (NOTE) Fact Sheet for Patients: BloggerCourse.com  Fact Sheet for Healthcare Providers: SeriousBroker.it  This test is not yet approved or cleared by the [place] FDA and has been authorized for detection and/or diagnosis of SARS-CoV-2 by FDA under an Emergency Use Authorization (EUA). This EUA will remain in effect (meaning this test can be used) for the duration of the COVID-19 declaration under Section 564(b)(1) of the Act, 21 U.S.C. section 360bbb-3(b)(1), unless the authorization is terminated or revoked.  Performed at [facility], [address] [place], [place] [address]   Urine Culture     Status: Abnormal (Preliminary result)   Collection Time: 01/09/23  4:20 PM   Specimen: Urine, Clean Catch  Result Value Ref Range Status   Specimen Description   Final    URINE, CLEAN CATCH Performed at [facility], [address] [place], [place] [address]    Special Requests   Final    NONE Performed at [facility], [address] [place], [place] [address]    Culture 40,000 COLONIES/mL GRAM NEGATIVE RODS (A)  Final   Report Status PENDING  Incomplete     Radiology Studies: DG Chest Port 1 View  Result Date: 01/09/2023 CLINICAL DATA:  Tachycardia and hypotension EXAM: PORTABLE CHEST 1 VIEW COMPARISON:  Chest  radiograph dated 10/17/2022 FINDINGS: Normal lung volumes. Rounded retrocardiac opacity. No pleural effusion or pneumothorax. The heart size and mediastinal contours are within normal limits. No acute osseous abnormality. Right upper quadrant surgical clips. IMPRESSION: 1. Rounded retrocardiac opacity, likely hiatal hernia. 2.  No focal consolidations. Electronically Signed   By: Agustin Cree M.D.   On: 01/09/2023 17:46    Scheduled Meds:  aspirin EC  81 mg Oral Daily   atorvastatin  80 mg Oral Daily   Chlorhexidine Gluconate Cloth  6  each Topical Daily   gabapentin  300 mg Oral Daily   And   gabapentin  600 mg Oral QHS   levothyroxine  50 mcg Oral Q0600   loratadine  10 mg Oral Daily   metoprolol tartrate  25 mg Oral BID   pantoprazole  40 mg Oral BID   Continuous Infusions:  cefTRIAXone (ROCEPHIN)  IV     diltiazem (CARDIZEM) infusion Stopped (01/10/23 1404)     LOS: 1 day   Time spent: 40 minutes  Carollee Herter, DO  [facility]  01/10/2023, 3:50 PM

## 2023-01-10 NOTE — Consult Note (Addendum)
Cardiology Consultation   Patient ID: CALAN FENNEMA MRN: [id number]; DOB: [birth year]  Admit date: 01/09/2023 Date of Consult: 01/10/2023  PCP:  [name], MD   [facility] Providers Cardiologist:  Christell Constant, MD      Patient Profile:   Jillian Rodgers is a 77 y.o. female with a hx of chronic thrombocytopenia, paroxysmal atrial fibrillation, history of TIA, hypothyroidism, hyperlipidemia, CKD stage III, hypertension who is being seen 01/10/2023 for the evaluation of atrial fibrillation at the request of Dr. Imogene Burn.  History of Present Illness:   Ms. Jillian Rodgers is a 77 year old female with past medical history who is followed by Dr. Izora Ribas (previously followed by Dr. Katrinka Blazing).  Per chart review, patient was first diagnosed with atrial fibrillation on 10/25/2020 when she presented to the ED with chest tightness and dizziness.  Patient underwent successful DCCV and was started on Eliquis.  Underwent echocardiogram on 12/14/2020 that showed EF 60-65%, no regional wall motion abnormalities, normal RV function, mild MR. Due to patient's chronic thrombocytopenia, there was concern for high bleeding risk when on Eliquis.  She wore a 30-day cardiac monitor in 02/2021 to evaluate atrial fibrillation burden.  Monitor showed normal sinus rhythm, no instances of atrial fibrillation.  Her Eliquis was discontinued.  She underwent sleep study and was diagnosed with OSA, started on CPAP.  Patient was last seen by cardiology in clinic on 04/28/2022.  At that time, patient had not been using CPAP due to claustrophobia.  Patient denied symptoms of recurrent atrial fibrillation.  It was noted that if patient were to have return of atrial fibrillation, would need to consider anticoagulation with possible LAA-O.   Patient was admitted to [facility] from 10/17/2022 - 10/18/2022 with a TIA.  She presented with slurred speech, inability to get words out, headache, lightheadedness.   CTA head and neck showed age-indeterminate infarcts in the left caudate head and right parietal lobe.  MRI showed no acute intracranial abnormality.  Echocardiogram on 10/18/2022 showed EF 60-65%, no regional wall motion abnormalities, mild LVH, grade 1 diastolic dysfunction, normal RV function, normal pulmonary artery systolic pressure, agitated saline contrast bubble study negative.  There was concern about starting antiplatelet therapy due to patient's history of thrombocytopenia.  Patient's hematologist, Dr. Al Pimple, said it was okay to attempt if needed by neurology.  Neurology ultimately recommended aspirin 81 mg daily and Lipitor.  Patient was seen by her hematologist in clinic on 9/24 for follow-up of thrombocytopenia.  It was noted that patient was tachycardic with heart rate in the 120s-130s.  Recommended that patient go to the emergency department for evaluation.  In the ED, initial vital signs showed heart rate 144 bpm, blood pressure 143/104. Initial EKG in the ED showed atrial fibrillation, heart rate 133.  Lab work revealed K3.6, creatinine 1.4, WBC 8.9, hemoglobin 13, platelets 62.  BNP elevated to 601.9.  High-sensitivity troponin 16, 16.  Urinalysis with large leukocytes and many bacteria consistent with UTI.  Chest x-ray showed rounded retrocardiac opacity, likely hiatal hernia, no focal consolidations.  In the ED, patient was given a dose of IV and oral metoprolol with some improvement in heart rate.  However heart rate increased back into the 130s.  She was started on Lopressor 12.5 mg twice daily, Cardizem bolus and drip.  Cardiology consulted  On interview, patient reports that she has been very fatigued and weak for the past few weeks.  Reports often feeling dizzy/lightheaded.  Due to her symptoms, she has been  spending a lot of time in bed and has not been as active as she likes to be.  She also noted having intermittent palpitations/fluttering in her chest for the past 2 weeks.  She has not  checked her vitals at home regularly, so she is not sure if her heart rate has been elevated at home.  She also has been having rare episodes of chest pressure, episodes are very brief and occur randomly.  She had a TIA in 10/2022 and was started on aspirin at that time.  Denies increased bleeding on aspirin, does note increased bruising.  Denies blood in urine, dark/bloody stools.  She has noticed that she has been more short of breath over the past few days.  Also has an occasional cough, ankle edema  Past Medical History:  Diagnosis Date   Anemia with low platelet count (HCC)    Chronic kidney disease    Degenerative joint disease    Dysrhythmia    a fib   GERD (gastroesophageal reflux disease)    History of kidney stones    Hyperlipidemia    Hypothyroidism    Inguinal hernia 02/19/2019   Labile hypertension    Migraines    neurontin helps   Pneumonia    hx of walking pneumonia    Primary hypertension 04/28/2022    Past Surgical History:  Procedure Laterality Date   BILATERAL CATARACT SURGERY      BLERPHOROPLASTY \     CHOLECYSTECTOMY  1996   open   COLONOSCOPY  2009   recommended 10 year f/u   HERNIA REPAIR  1985   umb hernia rpr   HERNIA REPAIR  2005 & 2009   ventral hernia rpr   INGUINAL HERNIA REPAIR Left 04/30/2020   Procedure: OPEN LEFT INGUINAL HERNIA REPAIR WITH MESH;  Surgeon: Berna Bue, MD;  Location: [facility];  Service: General;  Laterality: Left;   INGUINAL HERNIA REPAIR Right 10/24/2021   Procedure: OPEN RIGHT INGUINAL HERNIA REPAIR WITH MESH ;  Surgeon: Berna Bue, MD;  Location: [facility];  Service: General;  Laterality: Right;   PILONIDAL CYST EXCISION  1972   SKIN CANCER REMOVAL      TONSILLECTOMY     UPPER GI ENDOSCOPY     VENTRAL HERNIA REPAIR       Home Medications:  Prior to Admission medications   Medication Sig Start Date End Date Taking? Authorizing Provider  Ascorbic Acid (VITAMIN C) 500 MG CAPS Take 500 mg by mouth daily.    Yes  [provider]  aspirin EC 81 MG tablet Take 1 tablet (81 mg total) by mouth daily. Swallow whole. 10/19/22  Yes Rai, Ripudeep K, MD  atorvastatin (LIPITOR) 80 MG tablet Take 1 tablet (80 mg total) by mouth daily. 10/19/22  Yes Rai, Ripudeep K, MD  BOTOX 100 units SOLR injection Inject 100 Units into the muscle See admin instructions. Inject 100 units intramuscularly every 6-8 weeks 10/21/20  Yes [provider]  cetirizine (ZYRTEC) 10 MG tablet Take 10 mg by mouth daily.   Yes [provider]  Cholecalciferol (VITAMIN D3) 125 MCG (5000 UT) TABS Take 5,000 Units by mouth daily.   Yes [provider]  divalproex (DEPAKOTE ER) 500 MG 24 hr tablet Take 1 tablet (500 mg total) by mouth daily. 10/18/22  Yes Rai, Ripudeep K, MD  furosemide (LASIX) 20 MG tablet Take 1 tablet (20 mg total) by mouth daily as needed. Patient taking differently: Take 20 mg by mouth  daily as needed for fluid or edema. 01/03/23  Yes Cranford, Archie Patten, NP  gabapentin (NEURONTIN) 300 MG capsule Take 300 mg (1 tablet) in the morning and 600 mg (2 tablets) at bedtime Patient taking differently: Take 300-600 mg by mouth See admin instructions. Take 300 mg by mouth in the morning and 600 mg at bedtime 10/18/22  Yes Rai, Ripudeep K, MD  levothyroxine (SYNTHROID) 50 MCG tablet Take  1 tablet  Daily  on an empty stomach with only water for 30 minutes & no Antacid meds, Calcium or Magnesium for 4 hours & avoid Biotin                                                                                                                    /                                                                   TAKE                                         BY                                                 MOUTH Patient taking differently: Take 50 mcg by mouth See admin instructions. Take 50 mcg by mouth in the morning on an empty stomach, with only water. Take no antacids, calcium, or magnesium for 4 hours and avoid biotin. 11/15/22   Yes [name], MD  meloxicam (MOBIC) 15 MG tablet Take 15 mg by mouth daily as needed for pain.   Yes [provider]  pantoprazole (PROTONIX) 40 MG tablet Take  1 tablet  2 x /day  for Acid Indigestion & Heartburn Patient taking differently: Take 40 mg by mouth in the morning and at bedtime. 02/01/22  Yes [name], MD  Probiotic Product (PROBIOTIC PO) Take 1 capsule by mouth daily.   Yes [provider]  SYSTANE BALANCE 0.6 % SOLN Place 1 drop into both eyes at bedtime.   Yes [provider]  SYSTANE ULTRA 0.4-0.3 % SOLN Place 1 drop into both eyes in the morning.   Yes [provider]  TYLENOL 500 MG tablet Take 500-1,000 mg by mouth every 6 (six) hours as needed for mild pain or headache.   Yes [provider]  Zinc 30 MG TABS Take 30 mg by mouth daily.   Yes [provider]    Inpatient Medications: Scheduled Meds:  aspirin EC  81 mg Oral Daily  atorvastatin  80 mg Oral Daily   levothyroxine  50 mcg Oral Q0600   loratadine  10 mg Oral Daily   metoprolol tartrate  12.5 mg Oral BID   pantoprazole  40 mg Oral BID   Continuous Infusions:  sodium chloride 75 mL/hr at 01/09/23 2057   cefTRIAXone (ROCEPHIN)  IV     diltiazem (CARDIZEM) infusion 10 mg/hr (01/10/23 0636)   PRN Meds: acetaminophen **OR** acetaminophen, albuterol, ondansetron **OR** ondansetron (ZOFRAN) IV, traZODone  Allergies:   No Known Allergies  Social History:   Social History   Socioeconomic History   Marital status: Married    Spouse name: Not on file   Number of children: 2   Years of education: 18   Highest education level: Not on file  Occupational History   Not on file  Tobacco Use   Smoking status: Never   Smokeless tobacco: Never  Vaping Use   Vaping status: Never Used  Substance and Sexual Activity   Alcohol use: No   Drug use: No   Sexual activity: Yes  Other Topics Concern   Not on file  Social History Narrative   Not  on file   Social Determinants of Health   Financial Resource Strain: Not on file  Food Insecurity: No Food Insecurity (10/17/2022)   Hunger Vital Sign    Worried About Running Out of Food in the Last Year: Never true    Ran Out of Food in the Last Year: Never true  Transportation Needs: No Transportation Needs (10/17/2022)   PRAPARE - Administrator, Civil Service (Medical): No    Lack of Transportation (Non-Medical): No  Physical Activity: Not on file  Stress: Not on file  Social Connections: Not on file  Intimate Partner Violence: Not At Risk (10/17/2022)   Humiliation, Afraid, Rape, and Kick questionnaire    Fear of Current or Ex-Partner: No    Emotionally Abused: No    Physically Abused: No    Sexually Abused: No    Family History:    Family History  Problem Relation Age of Onset   Ovarian cancer Mother    Heart disease Mother    Cancer Mother    Uterine cancer Mother    Hypertension Mother    Breast cancer Sister    Stroke Paternal Uncle    Congestive Heart Failure Father    Gout Maternal Aunt    Colon cancer Neg Hx    Esophageal cancer Neg Hx    Stomach cancer Neg Hx    Rectal cancer Neg Hx      ROS:  Please see the history of present illness.   All other ROS reviewed and negative.     Physical Exam/Data:   Vitals:   01/10/23 0630 01/10/23 0650 01/10/23 0730 01/10/23 0745  BP: (!) 163/118  (!) 158/123 (!) 161/114  Pulse: (!) 108  (!) 117 81  Resp: 20  14 17   Temp:  (!) 97.4 F (36.3 C)    TempSrc:  Oral    SpO2: 100%  100% 100%  Weight:      Height:        Intake/Output Summary (Last 24 hours) at 01/10/2023 0805 Last data filed at 01/09/2023 2058 Gross per 24 hour  Intake 1600 ml  Output --  Net 1600 ml      01/09/2023    3:42 PM 01/09/2023    2:46 PM 01/03/2023    2:51 PM  Last 3 Weights  Weight (lbs)  152 lb 8 oz 152 lb 8 oz 158 lb 6.4 oz  Weight (kg) 69.174 kg 69.174 kg 71.85 kg     Body mass index is 27.89 kg/m.  General:  Well  nourished, well developed, in no acute distress. Sitting upright in the bed HEENT: normal Neck: no JVD Vascular: Radial pulses 2+ bilaterally Cardiac:  normal S1, S2; irregular rate and rhythm. No murmurs  Lungs:  crackles in bilateral lung bases, otherwise clear. Normal work of breathing on   Abd: soft, nontender Ext: trace edema in BLE Musculoskeletal:  No deformities, BUE and BLE strength normal and equal Skin: warm and dry  Neuro:  CNs 2-12 intact, no focal abnormalities noted Psych:  Normal affect   EKG:  The EKG was personally reviewed and demonstrates:  Atrial fibrillation, HR 133 BPM Telemetry:  Telemetry was personally reviewed and demonstrates:  Atrial fibrillation, HR in the 90s-120s   Relevant CV Studies:  Echocardiogram 10/18/22   1. Left ventricular ejection fraction, by estimation, is 60 to 65%. The  left ventricle has normal function. The left ventricle has no regional  wall motion abnormalities. There is mild left ventricular hypertrophy.  Left ventricular diastolic parameters  are consistent with Grade I diastolic dysfunction (impaired relaxation).   2. Right ventricular systolic function is normal. The right ventricular  size is normal. There is normal pulmonary artery systolic pressure. The  estimated right ventricular systolic pressure is 24.7 mmHg.   3. The mitral valve is normal in structure. Trivial mitral valve  regurgitation.   4. The aortic valve is tricuspid. Aortic valve regurgitation is not  visualized. Aortic valve sclerosis is present, with no evidence of aortic  valve stenosis.   5. The inferior vena cava is normal in size with greater than 50%  respiratory variability, suggesting right atrial pressure of 3 mmHg.   6. Agitated saline contrast bubble study was negative, with no evidence  of any interatrial shunt.   Laboratory Data:  High Sensitivity Troponin:   Recent Labs  Lab 01/09/23 1535 01/09/23 1803  TROPONINIHS 16 16      Chemistry Recent Labs  Lab 01/03/23 1533 01/09/23 1535 01/10/23 0527  NA 140 140 139  K 3.9 3.6 3.5  CL 104 106 106  CO2 29 25 21*  GLUCOSE 96 113* 98  BUN 29* 36* 28*  CREATININE 1.18* 1.40* 1.04*  CALCIUM 9.8 9.6 9.2  GFRNONAA  --  39* 55*  ANIONGAP  --  9 12    Recent Labs  Lab 01/03/23 1533 01/09/23 1535  PROT 6.1 6.9  ALBUMIN  --  3.2*  AST 20 24  ALT 16 21  ALKPHOS  --  57  BILITOT 0.6 0.4   Lipids  Recent Labs  Lab 01/03/23 1533  CHOL 143  TRIG 94  HDL 72  LDLCALC 53  CHOLHDL 2.0    Hematology Recent Labs  Lab 01/09/23 1416 01/09/23 1535 01/10/23 0527  WBC 8.8 8.9 8.8  RBC 4.18 4.21 3.90  HGB 12.8 13.0 11.8*  HCT 39.5 40.4 37.3  MCV 94.5 96.0 95.6  MCH 30.6 30.9 30.3  MCHC 32.4 32.2 31.6  RDW 13.5 13.6 13.6  PLT 62* 62* 68*   Thyroid  Recent Labs  Lab 01/09/23 1535  TSH 5.083*    BNP Recent Labs  Lab 01/09/23 1535  BNP 601.9*    DDimer No results for input(s): "DDIMER" in the last 168 hours.   Radiology/Studies:  [facility] Chest Port 1 View  Result Date:  01/09/2023 CLINICAL DATA:  Tachycardia and hypotension EXAM: PORTABLE CHEST 1 VIEW COMPARISON:  Chest radiograph dated 10/17/2022 FINDINGS: Normal lung volumes. Rounded retrocardiac opacity. No pleural effusion or pneumothorax. The heart size and mediastinal contours are within normal limits. No acute osseous abnormality. Right upper quadrant surgical clips. IMPRESSION: 1. Rounded retrocardiac opacity, likely hiatal hernia. 2.  No focal consolidations. Electronically Signed   By: Agustin Cree M.D.   On: 01/09/2023 17:46     Assessment and Plan:   Atrial fibrillation with RVR -Patient previously had an episode of atrial fibrillation in 10/2020 and underwent successful DCCV.  Follow-up 30-day monitor in 01/2021 showed no recurrence of atrial fibrillation.  Due to suspected low A-fib burden and chronic thrombocytopenia patient was taken off anticoagulation - Recent echocardiogram from 10/2022  showed EF 60-65%, no regional wall motion abnormalities, grade 1 diastolic dysfunction, normal RV function - With recent TIA, patient's CHA2DS2-VASc is now up to 6 (HTN, TIA, agex2, gender).  - As patient now has recurrence of atrial fibrillation with CHA2DS2-VASc of 6, ideally would start anticoagulation.  Complicating issue is her thrombocytopenia.  Platelet count currently in the 60s, so anticoagulation would not be contraindicated.  Discussed with patient's hematologist, Dr. Al Pimple, and okay with starting heparin drip and monitoring platelet count and for bleeding.  If drop in platelets less than 50 or any bleeding, would discontinue heparin.  If platelets stable and no bleeding, can plan to transition to Eliquis. -At discharge, could consider referral to Dr. Lalla Brothers for consideration of Watchman device -Rates well-controlled on diltiazem drip but developed hypotension, was discontinued.  BP improved, will rate control with metoprolol 25 mg 3 times daily and titrate as needed  Elevated BNP  - BNP elevated to 601.9  - She does have trace ankle edema, crackles in lung bases. Suspect she is holding onto fluid due to afib with RVR  - Ordered a 1 time dose of IV lasix 40 mg daily today. Also ordered K supplementation   Thrombocytopenia -Patient is followed by Dr. Al Pimple. Platelets ranging between 39-72 over the past year. 68 today  - As above, discussed with Dr. Al Pimple and will trial heparin gtt  HTN -Continue metoprolol  Hyperlipidemia - Continue lipitor 80 mg daily   History of TIA -Occurred in 10/2022 -Currently on aspirin, Lipitor.  Will stop aspirin with starting heparin drip  Otherwise, per primary -UTI -Hypothyroidism -GERD   Risk Assessment/Risk Scores:      CHA2DS2-VASc Score = 6  This indicates a 9.7% annual risk of stroke. The patient's score is based upon: CHF History: 0 HTN History: 1 Diabetes History: 0 Stroke History: 2 Vascular Disease History: 0 Age Score:  2 Gender Score: 1    For questions or updates, please contact [facility] Please consult [email or web address] for contact info under    Signed, Jonita Albee, PA-C  01/10/2023 8:05 AM   Patient seen and examined.  Agree with above documentation.  Ms. Seavey is a 77 year old female with a history of paroxysmal atrial fibrillation, TIA, chronic thrombocytopenia, CKD stage III, hypertension who were consulted by Dr. Laveda Norman for evaluation of atrial fibrillation.  She was initially diagnosed with A-fib in 2022, at the time underwent DCCV and was started on Eliquis.  Her Eliquis was discontinued due to chronic thrombocytopenia.  She wore a 30-day monitor 02/2021 which showed no A-fib.  Most recently seen by cardiology 04/2022.  She reported noncompliance with her CPAP.  Denied recurrent symptoms of atrial fibrillation.  She was  admitted 10/2022 with TIA.  Echocardiogram 10/18/2022 showed EF 60 to 65%, normal RV function, negative bubble study.  There was discussion of starting anticoagulation due to patient's history of thrombocytopenia, her hematologist said okay if needed, ultimately was started on aspirin and Lipitor per neurology.  She was seen in hematology clinic on 9/24 and noted heart rate was in the 120s 130s, she was sent to the ED for evaluation.  In the ED, found to be in A-fib with initial heart rate 140s bpm.  Vital signs otherwise notable for SpO2 90%, BP 143/104.    Labs notable for creatinine 1.4, BNP 602, troponin 16, platelets 62>68.  Patient seen and examined.  Agree with above documentation.  On exam, patient is alert and oriented, irregular rhythm, normal rate, no murmurs, lungs CTAB, no LE edema or JVD.  For atrial fibrillation, she was started on Lopressor 12.5 mg twice daily and Cardizem drip was started. On Cardizem drip, heart rate has been controlled but developing soft BP and cardizem was discontinued.  Most recent BP 138/107, heart rate currently 90s to 100s.  Would continue  to hold diltiazem, will titrate metoprolol for rate control.  Anticoagulation is complicated.  CHA2DS2-VASc 6, had recent TIA.  Has not been on anticoagulation due to thrombocytopenia, though does not have any history of bleeding.  Platelet count currently in the 60s, so anticoagulation would not be contraindicated.  Discussed with patient's hematologist, Dr. Al Pimple, and okay with starting heparin drip and monitoring platelet count and for bleeding.  If drop in platelets less than 50 or any bleeding, would discontinue heparin.  If platelets stable and no bleeding, can plan to transition to Eliquis.  [name], MD

## 2023-01-10 NOTE — Assessment & Plan Note (Addendum)
On po lopressor 50 mg bid  Adding cardizem 30 mg q6h for rate control. Monitor BP

## 2023-01-10 NOTE — Assessment & Plan Note (Addendum)
On IV rocephin. Urine cx growing Proteus.  Sensitive to Rocephin.  Pt completed 3 days of IV rocephin.  should be sufficient. Pt no longer having any dysuria.

## 2023-01-10 NOTE — Assessment & Plan Note (Addendum)
Chronic. Follows with heme/onc. Stable on IV heparin for 3 days. Changing to Eliquis for systemic anticoagulation for CVA prophylaxis.

## 2023-01-10 NOTE — ED Notes (Signed)
ED TO INPATIENT HANDOFF REPORT  Name/Age/Gender Jillian Rodgers 77 y.o. female  Code Status    Code Status Orders  (From admission, onward)           Start     Ordered   01/09/23 2001  Full code  Continuous       Question:  By:  Answer:  Consent: discussion documented in EHR   01/09/23 2002           Code Status History     Date Active Date Inactive Code Status Order ID Comments User Context   10/17/2022 1922 10/18/2022 2329 Full Code [id number]  Darlin Drop, DO Inpatient       Home/SNF/Other Home  Chief Complaint Atrial fibrillation with RVR (HCC) [I48.91]  Level of Care/Admitting Diagnosis ED Disposition     ED Disposition  Admit   Condition  --   Comment  H[facility] [[id number]]  Level of Care: Stepdown [14]  Admit to SDU based on following criteria: Cardiac Instability:  Patients experiencing chest pain, unconfirmed MI and stable, arrhythmias and CHF requiring medical management and potentially compromising patient's stability  May admit patient to [facility] or [facility] if equivalent level of care is available:: Yes  Covid Evaluation: Asymptomatic - no recent exposure (last 10 days) testing not required  Diagnosis: Atrial fibrillation with RVR [facility]) [[id number]]  Admitting Physician: Maryln Gottron [[id number]]  Attending Physician: Olexa.Dam, MIR Jaxson.Roy [[id number]]  Certification:: I certify this patient will need inpatient services for at least 2 midnights          Medical History Past Medical History:  Diagnosis Date   Anemia with low platelet count (HCC)    Chronic kidney disease    Degenerative joint disease    Dysrhythmia    a fib   GERD (gastroesophageal reflux disease)    History of kidney stones    Hyperlipidemia    Hypothyroidism    Inguinal hernia 02/19/2019   Labile hypertension    Migraines    neurontin helps   Pneumonia    hx of walking pneumonia    Primary hypertension 04/28/2022     Allergies No Known Allergies  IV Location/Drains/Wounds Patient Lines/Drains/Airways Status     Active Line/Drains/Airways     Name Placement date Placement time Site Days   Peripheral IV 01/09/23 20 G 1" Right Antecubital 01/09/23  1604  Antecubital  1            Labs/Imaging Results for orders placed or performed during the hospital encounter of 01/09/23 (from the past 48 hour(s))  Comprehensive metabolic panel     Status: Abnormal   Collection Time: 01/09/23  3:35 PM  Result Value Ref Range   Sodium 140 135 - 145 mmol/L   Potassium 3.6 3.5 - 5.1 mmol/L   Chloride 106 98 - 111 mmol/L   CO2 25 22 - 32 mmol/L   Glucose, Bld 113 (H) 70 - 99 mg/dL    Comment: Glucose reference range applies only to samples taken after fasting for at least 8 hours.   BUN 36 (H) 8 - 23 mg/dL   Creatinine, Ser 6.57 (H) 0.44 - 1.00 mg/dL   Calcium 9.6 8.9 - 84.6 mg/dL   Total Protein 6.9 6.5 - 8.1 g/dL   Albumin 3.2 (L) 3.5 - 5.0 g/dL   AST 24 15 - 41 U/L   ALT 21 0 - 44 U/L   Alkaline Phosphatase 57 38 - 126  U/L   Total Bilirubin 0.4 0.3 - 1.2 mg/dL   GFR, Estimated 39 (L) >60 mL/min    Comment: (NOTE) Calculated using the CKD-EPI Creatinine Equation (2021)    Anion gap 9 5 - 15    Comment: Performed at [facility], [address], [place], [place] [address]  CBC with Differential     Status: Abnormal   Collection Time: 01/09/23  3:35 PM  Result Value Ref Range   WBC 8.9 4.0 - 10.5 K/uL   RBC 4.21 3.87 - 5.11 MIL/uL   Hemoglobin 13.0 12.0 - 15.0 g/dL   HCT 19.1 47.8 - 29.5 %   MCV 96.0 80.0 - 100.0 fL   MCH 30.9 26.0 - 34.0 pg   MCHC 32.2 30.0 - 36.0 g/dL   RDW 62.1 30.8 - 65.7 %   Platelets 62 (L) 150 - 400 K/uL    Comment: SPECIMEN CHECKED FOR CLOTS Immature Platelet Fraction may be clinically indicated, consider ordering this additional test [id number] REPEATED TO VERIFY PLATELET COUNT CONFIRMED BY SMEAR    nRBC 0.0 0.0 - 0.2 %   Neutrophils  Relative % 73 %   Neutro Abs 6.5 1.7 - 7.7 K/uL   Lymphocytes Relative 11 %   Lymphs Abs 1.0 0.7 - 4.0 K/uL   Monocytes Relative 13 %   Monocytes Absolute 1.2 (H) 0.1 - 1.0 K/uL   Eosinophils Relative 1 %   Eosinophils Absolute 0.1 0.0 - 0.5 K/uL   Basophils Relative 0 %   Basophils Absolute 0.0 0.0 - 0.1 K/uL   Immature Granulocytes 2 %   Abs Immature Granulocytes 0.14 (H) 0.00 - 0.07 K/uL   Burr Cells PRESENT     Comment: Performed at [facility], [address] [place], [place] [address]  Troponin I (High Sensitivity)     Status: None   Collection Time: 01/09/23  3:35 PM  Result Value Ref Range   Troponin I (High Sensitivity) 16 <18 ng/L    Comment: (NOTE) Elevated high sensitivity troponin I (hsTnI) values and significant  changes across serial measurements may suggest ACS but many other  chronic and acute conditions are known to elevate hsTnI results.  Refer to the "Links" section for chest pain algorithms and additional  guidance. Performed at [facility], [address], [place], [place] [address]   Protime-INR     Status: None   Collection Time: 01/09/23  3:35 PM  Result Value Ref Range   Prothrombin Time 12.6 11.4 - 15.2 seconds   INR 0.9 0.8 - 1.2    Comment: (NOTE) INR goal varies based on device and disease states. Performed at [facility], [address] [place], [place] [address]   Brain natriuretic peptide     Status: Abnormal   Collection Time: 01/09/23  3:35 PM  Result Value Ref Range   B Natriuretic Peptide 601.9 (H) 0.0 - 100.0 pg/mL    Comment: Performed at [facility], [address] [place], [place] [address]  TSH     Status: Abnormal   Collection Time: 01/09/23  3:35 PM  Result Value Ref Range   TSH 5.083 (H) 0.350 - 4.500 uIU/mL    Comment: Performed by a 3rd Generation assay with a functional sensitivity of <=0.01 uIU/mL. Performed at [facility], [address] [place], [place] [address]   Urinalysis, Routine w reflex microscopic -Urine, Clean Catch     Status: Abnormal  Collection Time: 01/09/23  4:20 PM  Result Value Ref Range   Color, Urine YELLOW YELLOW   APPearance CLOUDY (A) CLEAR   Specific Gravity, Urine 1.018 1.005 - 1.030   pH 5.0 5.0 - 8.0   Glucose, UA NEGATIVE NEGATIVE mg/dL   Hgb urine dipstick NEGATIVE NEGATIVE   Bilirubin Urine NEGATIVE NEGATIVE   Ketones, ur NEGATIVE NEGATIVE mg/dL   Protein, ur 191 (A) NEGATIVE mg/dL   Nitrite NEGATIVE NEGATIVE   Leukocytes,Ua LARGE (A) NEGATIVE   RBC / HPF 11-20 0 - 5 RBC/hpf   WBC, UA >50 0 - 5 WBC/hpf   Bacteria, UA MANY (A) NONE SEEN   Squamous Epithelial / HPF 0-5 0 - 5 /HPF   WBC Clumps PRESENT    Mucus PRESENT    Hyaline Casts, UA PRESENT     Comment: Performed at [facility], [address] [place], [place] [address]  Resp panel by RT-PCR (RSV, Flu A&B, Covid) Anterior Nasal Swab     Status: None   Collection Time: 01/09/23  4:20 PM   Specimen: Anterior Nasal Swab  Result Value Ref Range   SARS Coronavirus 2 by RT PCR NEGATIVE NEGATIVE    Comment: (NOTE) SARS-CoV-2 target nucleic acids are NOT DETECTED.  The SARS-CoV-2 RNA is generally detectable in upper respiratory specimens during the acute phase of infection. The lowest concentration of SARS-CoV-2 viral copies this assay can detect is 138 copies/mL. A negative result does not preclude SARS-Cov-2 infection and should not be used as the sole basis for treatment or other patient management decisions. A negative result may occur with  improper specimen collection/handling, submission of specimen other than nasopharyngeal swab, presence of viral mutation(s) within the areas targeted by this assay, and inadequate number of viral copies(<138 copies/mL). A negative result must be combined with clinical observations, patient history, and epidemiological information. The expected  result is Negative.  Fact Sheet for Patients:  BloggerCourse.com  Fact Sheet for Healthcare Providers:  SeriousBroker.it  This test is no t yet approved or cleared by the [place] FDA and  has been authorized for detection and/or diagnosis of SARS-CoV-2 by FDA under an Emergency Use Authorization (EUA). This EUA will remain  in effect (meaning this test can be used) for the duration of the COVID-19 declaration under Section 564(b)(1) of the Act, 21 U.S.C.section 360bbb-3(b)(1), unless the authorization is terminated  or revoked sooner.       Influenza A by PCR NEGATIVE NEGATIVE   Influenza B by PCR NEGATIVE NEGATIVE    Comment: (NOTE) The Xpert Xpress SARS-CoV-2/FLU/RSV plus assay is intended as an aid in the diagnosis of influenza from Nasopharyngeal swab specimens and should not be used as a sole basis for treatment. Nasal washings and aspirates are unacceptable for Xpert Xpress SARS-CoV-2/FLU/RSV testing.  Fact Sheet for Patients: BloggerCourse.com  Fact Sheet for Healthcare Providers: SeriousBroker.it  This test is not yet approved or cleared by the [place] FDA and has been authorized for detection and/or diagnosis of SARS-CoV-2 by FDA under an Emergency Use Authorization (EUA). This EUA will remain in effect (meaning this test can be used) for the duration of the COVID-19 declaration under Section 564(b)(1) of the Act, 21 U.S.C. section 360bbb-3(b)(1), unless the authorization is terminated or revoked.     Resp Syncytial Virus by PCR NEGATIVE NEGATIVE    Comment: (NOTE) Fact Sheet for Patients: BloggerCourse.com  Fact Sheet for Healthcare Providers: SeriousBroker.it  This test is not yet approved or cleared by  the [employer] and has been authorized for detection and/or diagnosis of SARS-CoV-2  by FDA under an Emergency Use Authorization (EUA). This EUA will remain in effect (meaning this test can be used) for the duration of the COVID-19 declaration under Section 564(b)(1) of the Act, 21 U.S.C. section 360bbb-3(b)(1), unless the authorization is terminated or revoked.  Performed at [facility], [address] [place], [place] [address]   Urine Culture     Status: Abnormal (Preliminary result)   Collection Time: 01/09/23  4:20 PM   Specimen: Urine, Clean Catch  Result Value Ref Range   Specimen Description      URINE, CLEAN CATCH Performed at [facility], [address] [place], [place] [address]    Special Requests      NONE Performed at [facility], [address], [place], [place] [address]    Culture 40,000 COLONIES/mL GRAM NEGATIVE RODS (A)    Report Status PENDING   I-Stat Lactic Acid     Status: None   Collection Time: 01/09/23  4:30 PM  Result Value Ref Range   Lactic Acid, Venous 1.3 0.5 - 1.9 mmol/L  Troponin I (High Sensitivity)     Status: None   Collection Time: 01/09/23  6:03 PM  Result Value Ref Range   Troponin I (High Sensitivity) 16 <18 ng/L    Comment: (NOTE) Elevated high sensitivity troponin I (hsTnI) values and significant  changes across serial measurements may suggest ACS but many other  chronic and acute conditions are known to elevate hsTnI results.  Refer to the "Links" section for chest pain algorithms and additional  guidance. Performed at [facility], [address] [place], [place] [address]   CBC     Status: Abnormal   Collection Time: 01/10/23  5:27 AM  Result Value Ref Range   WBC 8.8 4.0 - 10.5 K/uL   RBC 3.90 3.87 - 5.11 MIL/uL   Hemoglobin 11.8 (L) 12.0 - 15.0 g/dL   HCT 29.5 62.1 - 30.8 %   MCV 95.6 80.0 - 100.0 fL   MCH 30.3 26.0 - 34.0 pg   MCHC 31.6 30.0 - 36.0 g/dL   RDW 65.7 84.6 - 96.2 %   Platelets 68 (L) 150 - 400 K/uL     Comment: SPECIMEN CHECKED FOR CLOTS Immature Platelet Fraction may be clinically indicated, consider ordering this additional test [id number] REPEATED TO VERIFY    nRBC 0.0 0.0 - 0.2 %    Comment: Performed at [facility], [address], [place], [place] [address]  Basic metabolic panel     Status: Abnormal   Collection Time: 01/10/23  5:27 AM  Result Value Ref Range   Sodium 139 135 - 145 mmol/L   Potassium 3.5 3.5 - 5.1 mmol/L   Chloride 106 98 - 111 mmol/L   CO2 21 (L) 22 - 32 mmol/L   Glucose, Bld 98 70 - 99 mg/dL    Comment: Glucose reference range applies only to samples taken after fasting for at least 8 hours.   BUN 28 (H) 8 - 23 mg/dL   Creatinine, Ser 2.72 (H) 0.44 - 1.00 mg/dL   Calcium 9.2 8.9 - 53.6 mg/dL   GFR, Estimated 55 (L) >60 mL/min    Comment: (NOTE) Calculated using the CKD-EPI Creatinine Equation (2021)    Anion gap 12 5 - 15    Comment: Performed at [facility], [address]  [place], [place] [address]   DG Chest Port 1 View  Result Date: 01/09/2023 CLINICAL DATA:  Tachycardia and hypotension EXAM: PORTABLE CHEST 1 VIEW COMPARISON:  Chest radiograph dated 10/17/2022 FINDINGS: Normal lung volumes. Rounded retrocardiac opacity. No pleural effusion or pneumothorax. The heart size and mediastinal contours are within normal limits. No acute osseous abnormality. Right upper quadrant surgical clips. IMPRESSION: 1. Rounded retrocardiac opacity, likely hiatal hernia. 2.  No focal consolidations. Electronically Signed   By: Agustin Cree M.D.   On: 01/09/2023 17:46    Pending Labs Unresulted Labs (From admission, onward)     Start     Ordered   01/10/23 0500  CBC  Daily,   R      01/09/23 2049   01/10/23 0500  Basic metabolic panel  Daily,   R      01/09/23 2049            Vitals/Pain Today's Vitals   01/10/23 1245 01/10/23 1345 01/10/23 1400 01/10/23 1405  BP: (!) 122/93 101/66 90/66   Pulse: 82 72 95 95  Resp: 18  (!) 22 (!) 24 17  Temp:      TempSrc:      SpO2: 97% 93% 95% 95%  Weight:      Height:      PainSc:        Isolation Precautions No active isolations  Medications Medications  aspirin EC tablet 81 mg (81 mg Oral Given 01/10/23 0921)  atorvastatin (LIPITOR) tablet 80 mg (80 mg Oral Given 01/10/23 0928)  levothyroxine (SYNTHROID) tablet 50 mcg (50 mcg Oral Given 01/10/23 0515)  pantoprazole (PROTONIX) EC tablet 40 mg (40 mg Oral Given 01/10/23 0920)  loratadine (CLARITIN) tablet 10 mg (10 mg Oral Given 01/10/23 0920)  acetaminophen (TYLENOL) tablet 650 mg (has no administration in time range)    Or  acetaminophen (TYLENOL) suppository 650 mg (has no administration in time range)  traZODone (DESYREL) tablet 25 mg (has no administration in time range)  ondansetron (ZOFRAN) tablet 4 mg (has no administration in time range)    Or  ondansetron (ZOFRAN) injection 4 mg (has no administration in time range)  albuterol (PROVENTIL) (2.5 MG/3ML) 0.083% nebulizer solution 2.5 mg (has no administration in time range)  diltiazem (CARDIZEM) 1 mg/mL load via infusion 15 mg (15 mg Intravenous Bolus from Bag 01/09/23 2050)    And  diltiazem (CARDIZEM) 125 mg in dextrose 5% 125 mL (1 mg/mL) infusion (0 mg/hr Intravenous Paused 01/10/23 1404)  cefTRIAXone (ROCEPHIN) 1 g in sodium chloride 0.9 % 100 mL IVPB (has no administration in time range)  metoprolol tartrate (LOPRESSOR) tablet 25 mg (25 mg Oral Given 01/10/23 0919)  gabapentin (NEURONTIN) capsule 300 mg (300 mg Oral Given 01/10/23 0922)    And  gabapentin (NEURONTIN) capsule 600 mg (has no administration in time range)  lactated ringers bolus 1,000 mL (0 mLs Intravenous Stopped 01/09/23 1753)  metoprolol tartrate (LOPRESSOR) injection 5 mg (5 mg Intravenous Given 01/09/23 1753)  lactated ringers bolus 500 mL (0 mLs Intravenous Stopped 01/09/23 1834)  metoprolol tartrate (LOPRESSOR) injection 5 mg (5 mg Intravenous Given 01/09/23 1826)  cefTRIAXone  (ROCEPHIN) 1 g in sodium chloride 0.9 % 100 mL IVPB (0 g Intravenous Stopped 01/09/23 2058)  metoprolol tartrate (LOPRESSOR) tablet 12.5 mg (12.5 mg Oral Given 01/09/23 1943)  furosemide (LASIX) injection 40 mg (40 mg Intravenous Given 01/10/23 0949)  potassium chloride SA (KLOR-CON M) CR tablet 40 mEq (40 mEq Oral Given 01/10/23 0949)  Mobility walks

## 2023-01-10 NOTE — Progress Notes (Signed)
ANTICOAGULATION CONSULT NOTE - Initial Consult  Pharmacy Consult for heparin Indication: atrial fibrillation  No Known Allergies  Patient Measurements: Height: 5\' 4"  (162.6 cm) Weight: 70.7 kg (155 lb 13.8 oz) IBW/kg (Calculated) : 54.7 Heparin Dosing Weight: 69.2 kg admit wt  Vital Signs: Temp: 98.1 F (36.7 C) (09/25 1542) Temp Source: Oral (09/25 1456) BP: 138/107 (09/25 1600) Pulse Rate: 95 (09/25 1600)  Labs: Recent Labs    01/09/23 1416 01/09/23 1535 01/09/23 1803 01/10/23 0527  HGB 12.8 13.0  --  11.8*  HCT 39.5 40.4  --  37.3  PLT 62* 62*  --  68*  LABPROT  --  12.6  --   --   INR  --  0.9  --   --   CREATININE  --  1.40*  --  1.04*  TROPONINIHS  --  16 16  --     Estimated Creatinine Clearance: 43 mL/min (A) (by C-G formula based on SCr of 1.04 mg/dL (H)).   Medical History: Past Medical History:  Diagnosis Date   Anemia with low platelet count (HCC)    Chronic kidney disease    Degenerative joint disease    Dysrhythmia    a fib   GERD (gastroesophageal reflux disease)    History of kidney stones    Hyperlipidemia    Hypothyroidism    Inguinal hernia 02/19/2019   Labile hypertension    Migraines    neurontin helps   Pneumonia    hx of walking pneumonia    Primary hypertension 04/28/2022    Medications:  Medications Prior to Admission  Medication Sig Dispense Refill Last Dose   Ascorbic Acid (VITAMIN C) 500 MG CAPS Take 500 mg by mouth daily.    Past Week   aspirin EC 81 MG tablet Take 1 tablet (81 mg total) by mouth daily. Swallow whole. 30 tablet 12 01/08/2023 at 0800   atorvastatin (LIPITOR) 80 MG tablet Take 1 tablet (80 mg total) by mouth daily. 30 tablet 3 01/08/2023 at am   BOTOX 100 units SOLR injection Inject 100 Units into the muscle See admin instructions. Inject 100 units intramuscularly every 6-8 weeks   Past Month   cetirizine (ZYRTEC) 10 MG tablet Take 10 mg by mouth daily.   01/08/2023   Cholecalciferol (VITAMIN D3) 125 MCG (5000  UT) TABS Take 5,000 Units by mouth daily.   01/08/2023   divalproex (DEPAKOTE ER) 500 MG 24 hr tablet Take 1 tablet (500 mg total) by mouth daily. 30 tablet 3 01/08/2023   furosemide (LASIX) 20 MG tablet Take 1 tablet (20 mg total) by mouth daily as needed. (Patient taking differently: Take 20 mg by mouth daily as needed for fluid or edema.) 30 tablet 2 unk   gabapentin (NEURONTIN) 300 MG capsule Take 300 mg (1 tablet) in the morning and 600 mg (2 tablets) at bedtime (Patient taking differently: Take 300-600 mg by mouth See admin instructions. Take 300 mg by mouth in the morning and 600 mg at bedtime) 90 capsule 3 01/09/2023 at am   levothyroxine (SYNTHROID) 50 MCG tablet Take  1 tablet  Daily  on an empty stomach with only water for 30 minutes & no Antacid meds, Calcium or Magnesium for 4 hours & avoid Biotin                                                                                                                    /  TAKE                                         BY                                                 MOUTH (Patient taking differently: Take 50 mcg by mouth See admin instructions. Take 50 mcg by mouth in the morning on an empty stomach, with only water. Take no antacids, calcium, or magnesium for 4 hours and avoid biotin.) 90 tablet 3 01/08/2023 at am   meloxicam (MOBIC) 15 MG tablet Take 15 mg by mouth daily as needed for pain.   unk   pantoprazole (PROTONIX) 40 MG tablet Take  1 tablet  2 x /day  for Acid Indigestion & Heartburn (Patient taking differently: Take 40 mg by mouth in the morning and at bedtime.) 180 tablet 3 01/09/2023 at am   Probiotic Product (PROBIOTIC PO) Take 1 capsule by mouth daily.   01/08/2023   SYSTANE BALANCE 0.6 % SOLN Place 1 drop into both eyes at bedtime.   01/08/2023 at pm   SYSTANE ULTRA 0.4-0.3 % SOLN Place 1 drop into both eyes in the morning.   01/09/2023 at am   TYLENOL 500 MG tablet Take  500-1,000 mg by mouth every 6 (six) hours as needed for mild pain or headache.   unk   Zinc 30 MG TABS Take 30 mg by mouth daily.   01/08/2023    Assessment: Pharmacy consulted to dose heparin for Afib in 77 yo F.   PMH significant for chronic thrombocytopenia & A fib first diagnosed 10/25/2020. She is currently not on any anticoagulants.  Per chart note from cards: "Discussed with patient's hematologist, Dr. Al Pimple, and okay with starting heparin drip and monitoring platelet count and for bleeding.  If drop in platelets less than 50 or any bleeding, would discontinue heparin.  If platelets stable and no bleeding, can plan to transition to Eliquis."  Baseline labs:  INR 0.8 PLT 62> 68 Hg 13> 11.8 SCr 1.4> 1.04   Goal of Therapy:  Heparin level 0.3-0.7 units/ml Monitor platelets by anticoagulation protocol: Yes   Plan:  Heparin 3000 unit bolus Heparin drip @ 950 units/hr  Check 8 hour heparin level Daily CBC & heparin level   Herby Abraham, Pharm.D Use secure chat for questions 01/10/2023 4:45 PM

## 2023-01-10 NOTE — Subjective & Objective (Addendum)
Pt seen and examined. Cards increased lopressor to 50 mg bid yesterday.  Not a candidate for DCCV due to pt's unknown ability to tolerate systemic anticoagulation for 1 month after DCCV.  HR remains elevated this AM. Ranging 110s-130s while lying in bed. No SOB. No chest pain.   Platelet count stable on IV heparin

## 2023-01-10 NOTE — Assessment & Plan Note (Signed)
On lipitor 80 mg daily

## 2023-01-10 NOTE — Assessment & Plan Note (Addendum)
Admitted for rapid afib. Started on IV cardizem. Cardiology consulted. Had echo in 10-2022 for her TIA workup that showed normal LVEF. Cards ordered limited echo.  Discussed with cards/Dr. Bjorn Pippin. Ok to start eliquis and stop IV heparin. Pt has tolerated IV heparin for 3 days now without significant drop in platelet count. Also since HR still >120s, will start cardizem po 30 mg q6h in attempt for better HR control. This was on 01-12-2023.  Pt converted to NSR on 01-12-2023 after 1 dose of po cardizem.  Pt monitored overnight. Remains in NSR.  Will discharge on po lopressor 50 mg bid, eliquis 5 mg bid.  Pt will f/u with Dr. Lalla Brothers with EP/Cards to consider Watchman device.  Plan on keep K >4 and Mg >2.0. will discharge on low dose Kcl and Mg supplementation

## 2023-01-10 NOTE — ED Notes (Signed)
Contacted cardiology regarding BP 90/66 and HR <100.  Paused per cardiology

## 2023-01-10 NOTE — Assessment & Plan Note (Signed)
Stable. On synthroid 50 mg daily.

## 2023-01-11 DIAGNOSIS — I10 Essential (primary) hypertension: Secondary | ICD-10-CM | POA: Diagnosis not present

## 2023-01-11 DIAGNOSIS — E876 Hypokalemia: Secondary | ICD-10-CM | POA: Insufficient documentation

## 2023-01-11 DIAGNOSIS — D696 Thrombocytopenia, unspecified: Secondary | ICD-10-CM | POA: Diagnosis not present

## 2023-01-11 DIAGNOSIS — Z8673 Personal history of transient ischemic attack (TIA), and cerebral infarction without residual deficits: Secondary | ICD-10-CM | POA: Diagnosis not present

## 2023-01-11 DIAGNOSIS — N3000 Acute cystitis without hematuria: Secondary | ICD-10-CM | POA: Diagnosis not present

## 2023-01-11 DIAGNOSIS — I4891 Unspecified atrial fibrillation: Secondary | ICD-10-CM | POA: Diagnosis not present

## 2023-01-11 DIAGNOSIS — N1831 Chronic kidney disease, stage 3a: Secondary | ICD-10-CM | POA: Diagnosis not present

## 2023-01-11 LAB — BASIC METABOLIC PANEL
Anion gap: 9 (ref 5–15)
BUN: 25 mg/dL — ABNORMAL HIGH (ref 8–23)
CO2: 25 mmol/L (ref 22–32)
Calcium: 9 mg/dL (ref 8.9–10.3)
Chloride: 105 mmol/L (ref 98–111)
Creatinine, Ser: 0.88 mg/dL (ref 0.44–1.00)
GFR, Estimated: >60 mL/min (ref >60)
Glucose, Bld: 111 mg/dL — ABNORMAL HIGH (ref 70–99)
Potassium: 3.5 mmol/L (ref 3.5–5.1)
Sodium: 139 mmol/L (ref 135–145)

## 2023-01-11 LAB — URINE CULTURE: Culture: 40000 — AB

## 2023-01-11 LAB — HEPARIN LEVEL (UNFRACTIONATED)
Heparin Unfractionated: 0.53 IU/mL (ref 0.30–0.70)
Heparin Unfractionated: 0.54 IU/mL (ref 0.30–0.70)

## 2023-01-11 LAB — CBC
HCT: 31.5 % — ABNORMAL LOW (ref 36.0–46.0)
Hemoglobin: 10.2 g/dL — ABNORMAL LOW (ref 12.0–15.0)
MCH: 30.5 pg (ref 26.0–34.0)
MCHC: 32.4 g/dL (ref 30.0–36.0)
MCV: 94.3 fL (ref 80.0–100.0)
Platelets: 64 10*3/uL — ABNORMAL LOW (ref 150–400)
RBC: 3.34 MIL/uL — ABNORMAL LOW (ref 3.87–5.11)
RDW: 13.6 % (ref 11.5–15.5)
WBC: 8.7 10*3/uL (ref 4.0–10.5)
nRBC: 0 % (ref 0.0–0.2)

## 2023-01-11 LAB — MAGNESIUM: Magnesium: 1.9 mg/dL (ref 1.7–2.4)

## 2023-01-11 MED ORDER — POTASSIUM CHLORIDE 10 MEQ/100ML IV SOLN
10.0000 meq | INTRAVENOUS | Status: AC
  Administered 2023-01-11 (×2): 10 meq via INTRAVENOUS
  Filled 2023-01-11 (×2): qty 100

## 2023-01-11 MED ORDER — METOPROLOL TARTRATE 50 MG PO TABS
50.0000 mg | ORAL_TABLET | Freq: Two times a day (BID) | ORAL | Status: DC
  Administered 2023-01-11 – 2023-01-13 (×5): 50 mg via ORAL
  Filled 2023-01-11: qty 1
  Filled 2023-01-11 (×2): qty 2
  Filled 2023-01-11: qty 1
  Filled 2023-01-11: qty 2

## 2023-01-11 MED ORDER — POTASSIUM CHLORIDE CRYS ER 20 MEQ PO TBCR
40.0000 meq | EXTENDED_RELEASE_TABLET | ORAL | Status: AC
  Administered 2023-01-11 (×2): 40 meq via ORAL
  Filled 2023-01-11 (×2): qty 2

## 2023-01-11 MED ORDER — MAGNESIUM SULFATE 2 GM/50ML IV SOLN
2.0000 g | Freq: Once | INTRAVENOUS | Status: AC
  Administered 2023-01-11: 2 g via INTRAVENOUS
  Filled 2023-01-11: qty 50

## 2023-01-11 NOTE — Assessment & Plan Note (Addendum)
Repleted with po and IV Kcl. Resolved.

## 2023-01-11 NOTE — Progress Notes (Signed)
ANTICOAGULATION CONSULT NOTE   Pharmacy Consult for heparin Indication: atrial fibrillation  No Known Allergies  Patient Measurements: Height: 5\' 4"  (162.6 cm) Weight: 70.7 kg (155 lb 13.8 oz) IBW/kg (Calculated) : 54.7 Heparin Dosing Weight: 69.2 kg  Vital Signs: Temp: 98.2 F (36.8 C) (09/26 0755) Temp Source: Oral (09/26 0755) BP: 154/123 (09/26 0800) Pulse Rate: 114 (09/26 0800)  Labs: Recent Labs    01/09/23 1535 01/09/23 1803 01/10/23 0527 01/11/23 0040 01/11/23 0924  HGB 13.0  --  11.8* 10.2*  --   HCT 40.4  --  37.3 31.5*  --   PLT 62*  --  68* 64*  --   LABPROT 12.6  --   --   --   --   INR 0.9  --   --   --   --   HEPARINUNFRC  --   --   --  0.53 0.54  CREATININE 1.40*  --  1.04* 0.88  --   TROPONINIHS 16 16  --   --   --     Estimated Creatinine Clearance: 50.8 mL/min (by C-G formula based on SCr of 0.88 mg/dL).   Medical History: Past Medical History:  Diagnosis Date   Anemia with low platelet count (HCC)    Chronic kidney disease    Degenerative joint disease    Dysrhythmia    a fib   GERD (gastroesophageal reflux disease)    History of kidney stones    Hyperlipidemia    Hypothyroidism    Inguinal hernia 02/19/2019   Labile hypertension    Migraines    neurontin helps   Pneumonia    hx of walking pneumonia    Primary hypertension 04/28/2022     Assessment: Pharmacy consulted to dose heparin for Afib in 77 yo F. PMH significant for chronic thrombocytopenia & A fib first diagnosed 10/25/2020. She is currently not on any anticoagulants.  Per chart note from Cardiology: "Discussed with patient's hematologist, Dr. Al Pimple, and okay with starting heparin drip and monitoring platelet count and for bleeding. If drop in platelets less than 50 or any bleeding, would discontinue heparin.  If platelets stable and no bleeding, can plan to transition to Eliquis."  01/11/23 Heparin level 0.5 - therapeutic with heparin infusion at 950 units/hr Hgb slight  trend down, plt low but relatively stable No complications of therapy noted  Goal of Therapy:  Heparin level 0.3-0.7 units/ml Monitor platelets by anticoagulation protocol: Yes   Plan:  -Continue heparin infusion at 950 units/hr  -Daily CBC & heparin level  -Follow for transition to oral anticoagulation as appropriate  Pricilla Riffle, PharmD, BCPS Clinical Pharmacist 01/11/2023 10:20 AM

## 2023-01-11 NOTE — Progress Notes (Signed)
PROGRESS NOTE    Jillian Rodgers  ZOX:[id number] DOB: September 25, 1945 DOA: 01/09/2023 PCP: [name], MD  Subjective: Pt seen and examined. Transferred to SDU bed last night.  IV cardizem gtts stopped due to borderline BP.  HR still 120-130s off of IV cardizem. I asked bedside RN to keep pt NPO in case cardiology wants to perform DCCV. Pt started on IV heparin last night. Plt count stable.   Hospital Course: HPI: Jillian Rodgers is a 77 y.o. female with medical history significant for chronic thrombocytopenia, hyperlipidemia, hypothyroidism, CKD stage III, TIA on aspirin, paroxysmal atrial fibrillation not on anticoagulation admitted to the hospital with A-fib with RVR.  She underwent successful DCCV on 10/25/2020 and at the time was on Eliquis, she wore a cardiac monitor later that year and was taken off Eliquis due to no further atrial fibrillation and chronic thrombocytopenia for which she is followed by hematology.  She had no known recurrence of atrial fibrillation, was hospitalized in July for TIA and since then has been on aspirin.  She had unremarkable echocardiography during that hospital stay than grade 1 diastolic dysfunction.  For the last week or so, she has been having burning with urination, and for the last couple of weeks she has been lethargic, feeling dizzy and lightheaded with exertion (husband at bedside states that she has sometimes been dizzy and lightheaded even when lying in bed), has not felt any palpitations, or chest pain.  When she had atrial fibrillation back in 2022 she had palpitations.  This time is different.  She had a follow-up appointment with her hematologist, who noted that the patient was tachycardic, and mentioned her symptoms.  She was sent to the ER for evaluation.   Significant Events: Admitted 01/09/2023 for rapid afib and UTI   Significant Labs: Admitting urine cx growing GNR  Significant Imaging Studies:   Antibiotic Therapy: Anti-infectives  (From admission, onward)    Start     Dose/Rate Route Frequency Ordered Stop   01/10/23 1830  cefTRIAXone (ROCEPHIN) 1 g in sodium chloride 0.9 % 100 mL IVPB        1 g 200 mL/hr over 30 Minutes Intravenous Every 24 hours 01/09/23 2103     01/09/23 1815  cefTRIAXone (ROCEPHIN) 1 g in sodium chloride 0.9 % 100 mL IVPB        1 g 200 mL/hr over 30 Minutes Intravenous  Once 01/09/23 1814 01/09/23 2058       Procedures:   Consultants: Cardiology    Assessment and Plan: * Atrial fibrillation with RVR (HCC) Admitted for rapid afib. Started on IV cardizem. Cardiology consulted. Had echo in 10-2022 for her TIA workup that showed normal LVEF. Messaged with Cards APP. They said she does not need repeat echo.  Cards discussed systemic AC with hematology. Pt started on IV heparin.  IV cardizem held last night due to low/borderline BP. Has not been restarted.  HR still not controlled. Pt has had DCCV in the past. Will keep pt NPO until seen by cards so pt can potentially get DCCV if cards thinks this is appropriate Replete K and Mg. Keep K >4.0 and Mg >2.0  Acute cystitis without hematuria On IV rocephin. Urine cx growing GNR. Continue IV Abx. Awaiting final culture results. Pt no longer having any dysuria.  Thrombocytopenia (HCC) Chronic. Follows with heme/onc. Stable on IV heparin.  Primary hypertension po lopressor.  CKD stage 3a, GFR 45-59 ml/min (HCC) - baseline SCr 1.1-1.4 Stable.  Hypothyroidism Stable.  On synthroid 50 mg daily.  Hyperlipidemia On lipitor 80 mg daily.  Hypokalemia Replete with po and IV Kcl. Repeat BMP in AM.  History of TIA (transient ischemic attack) Echo normal in 10-2022       DVT prophylaxis: SCDs Start: 01/09/23 2001  IV Heparin   Code Status: Full Code Family Communication: no family at bedside Disposition Plan: return home Reason for continuing need for hospitalization: in SDU for rapid afib.  Objective: Vitals:   01/10/23 2346  01/11/23 0000 01/11/23 0335 01/11/23 0400  BP:  116/85  108/81  Pulse: (!) 103 100  (!) 103  Resp: 18 (!) 24  20  Temp: 98.1 F (36.7 C)  98.1 F (36.7 C)   TempSrc: Oral  Oral   SpO2: 96% 90%  92%  Weight:      Height:        Intake/Output Summary (Last 24 hours) at 01/11/2023 0836 Last data filed at 01/11/2023 0300 Gross per 24 hour  Intake 483.85 ml  Output --  Net 483.85 ml   Filed Weights   01/09/23 1542 01/10/23 1546  Weight: 69.2 kg 70.7 kg    Examination:  Physical Exam Vitals and nursing note reviewed.  Constitutional:      General: She is not in acute distress.    Appearance: Normal appearance. She is not toxic-appearing or diaphoretic.  HENT:     Head: Normocephalic and atraumatic.  Eyes:     General: No scleral icterus. Cardiovascular:     Rate and Rhythm: Tachycardia present. Rhythm irregular.  Pulmonary:     Effort: Pulmonary effort is normal. No respiratory distress.     Breath sounds: Normal breath sounds.  Abdominal:     General: Abdomen is flat. Bowel sounds are normal. There is no distension.     Palpations: Abdomen is soft.  Musculoskeletal:     Right lower leg: No edema.     Left lower leg: No edema.  Skin:    General: Skin is warm and dry.     Capillary Refill: Capillary refill takes less than 2 seconds.  Neurological:     General: No focal deficit present.     Mental Status: She is alert and oriented to person, place, and time.     Data Reviewed: I have personally reviewed following labs and imaging studies  CBC: Recent Labs  Lab 01/09/23 1416 01/09/23 1535 01/10/23 0527 01/11/23 0040  WBC 8.8 8.9 8.8 8.7  NEUTROABS 6.2 6.5  --   --   HGB 12.8 13.0 11.8* 10.2*  HCT 39.5 40.4 37.3 31.5*  MCV 94.5 96.0 95.6 94.3  PLT 62* 62* 68* 64*   Basic Metabolic Panel: Recent Labs  Lab 01/09/23 1535 01/10/23 0527 01/11/23 0040  NA 140 139 139  K 3.6 3.5 3.5  CL 106 106 105  CO2 25 21* 25  GLUCOSE 113* 98 111*  BUN 36* 28* 25*   CREATININE 1.40* 1.04* 0.88  CALCIUM 9.6 9.2 9.0  MG  --   --  1.9   GFR: Estimated Creatinine Clearance: 50.8 mL/min (by C-G formula based on SCr of 0.88 mg/dL). Liver Function Tests: Recent Labs  Lab 01/09/23 1535  AST 24  ALT 21  ALKPHOS 57  BILITOT 0.4  PROT 6.9  ALBUMIN 3.2*   Coagulation Profile: Recent Labs  Lab 01/09/23 1535  INR 0.9   Thyroid Function Tests: Recent Labs    01/09/23 1535  TSH 5.083*   Sepsis Labs: Recent Labs  Lab  01/09/23 1630  LATICACIDVEN 1.3    Recent Results (from the past 240 hour(s))  Resp panel by RT-PCR (RSV, Flu A&B, Covid) Anterior Nasal Swab     Status: None   Collection Time: 01/09/23  4:20 PM   Specimen: Anterior Nasal Swab  Result Value Ref Range Status   SARS Coronavirus 2 by RT PCR NEGATIVE NEGATIVE Final    Comment: (NOTE) SARS-CoV-2 target nucleic acids are NOT DETECTED.  The SARS-CoV-2 RNA is generally detectable in upper respiratory specimens during the acute phase of infection. The lowest concentration of SARS-CoV-2 viral copies this assay can detect is 138 copies/mL. A negative result does not preclude SARS-Cov-2 infection and should not be used as the sole basis for treatment or other patient management decisions. A negative result may occur with  improper specimen collection/handling, submission of specimen other than nasopharyngeal swab, presence of viral mutation(s) within the areas targeted by this assay, and inadequate number of viral copies(<138 copies/mL). A negative result must be combined with clinical observations, patient history, and epidemiological information. The expected result is Negative.  Fact Sheet for Patients:  BloggerCourse.com  Fact Sheet for Healthcare Providers:  SeriousBroker.it  This test is no t yet approved or cleared by the [place] FDA and  has been authorized for detection and/or diagnosis of SARS-CoV-2 by FDA under  an Emergency Use Authorization (EUA). This EUA will remain  in effect (meaning this test can be used) for the duration of the COVID-19 declaration under Section 564(b)(1) of the Act, 21 U.S.C.section 360bbb-3(b)(1), unless the authorization is terminated  or revoked sooner.       Influenza A by PCR NEGATIVE NEGATIVE Final   Influenza B by PCR NEGATIVE NEGATIVE Final    Comment: (NOTE) The Xpert Xpress SARS-CoV-2/FLU/RSV plus assay is intended as an aid in the diagnosis of influenza from Nasopharyngeal swab specimens and should not be used as a sole basis for treatment. Nasal washings and aspirates are unacceptable for Xpert Xpress SARS-CoV-2/FLU/RSV testing.  Fact Sheet for Patients: BloggerCourse.com  Fact Sheet for Healthcare Providers: SeriousBroker.it  This test is not yet approved or cleared by the [place] FDA and has been authorized for detection and/or diagnosis of SARS-CoV-2 by FDA under an Emergency Use Authorization (EUA). This EUA will remain in effect (meaning this test can be used) for the duration of the COVID-19 declaration under Section 564(b)(1) of the Act, 21 U.S.C. section 360bbb-3(b)(1), unless the authorization is terminated or revoked.     Resp Syncytial Virus by PCR NEGATIVE NEGATIVE Final    Comment: (NOTE) Fact Sheet for Patients: BloggerCourse.com  Fact Sheet for Healthcare Providers: SeriousBroker.it  This test is not yet approved or cleared by the [place] FDA and has been authorized for detection and/or diagnosis of SARS-CoV-2 by FDA under an Emergency Use Authorization (EUA). This EUA will remain in effect (meaning this test can be used) for the duration of the COVID-19 declaration under Section 564(b)(1) of the Act, 21 U.S.C. section 360bbb-3(b)(1), unless the authorization is terminated or revoked.  Performed at [facility], [address], [place], [place] [address]   Urine Culture     Status: Abnormal (Preliminary result)   Collection Time: 01/09/23  4:20 PM   Specimen: Urine, Clean Catch  Result Value Ref Range Status   Specimen Description   Final    URINE, CLEAN CATCH Performed at [facility], [address] [place], [place] [address]    Special Requests  Final    NONE Performed at [facility], [address], [place], [place] [address]    Culture 40,000 COLONIES/mL GRAM NEGATIVE RODS (A)  Final   Report Status PENDING  Incomplete  MRSA Next Gen by PCR, Nasal     Status: None   Collection Time: 01/10/23  3:58 PM   Specimen: Nasal Mucosa; Nasal Swab  Result Value Ref Range Status   MRSA by PCR Next Gen NOT DETECTED NOT DETECTED Final    Comment: (NOTE) The GeneXpert MRSA Assay (FDA approved for NASAL specimens only), is one component of a comprehensive MRSA colonization surveillance program. It is not intended to diagnose MRSA infection nor to guide or monitor treatment for MRSA infections. Test performance is not FDA approved in patients less than 33 years old. Performed at [facility], [address] [place], [place] [address]      Radiology Studies: [facility] Chest Port 1 View  Result Date: 01/09/2023 CLINICAL DATA:  Tachycardia and hypotension EXAM: PORTABLE CHEST 1 VIEW COMPARISON:  Chest radiograph dated 10/17/2022 FINDINGS: Normal lung volumes. Rounded retrocardiac opacity. No pleural effusion or pneumothorax. The heart size and mediastinal contours are within normal limits. No acute osseous abnormality. Right upper quadrant surgical clips. IMPRESSION: 1. Rounded retrocardiac opacity, likely hiatal hernia. 2.  No focal consolidations. Electronically Signed   By: Agustin Cree M.D.   On: 01/09/2023 17:46    Scheduled Meds:  atorvastatin  80 mg Oral Daily   Chlorhexidine Gluconate Cloth  6 each Topical Daily    divalproex  500 mg Oral Daily   gabapentin  300 mg Oral Daily   And   gabapentin  600 mg Oral QHS   levothyroxine  50 mcg Oral Q0600   loratadine  10 mg Oral Daily   metoprolol tartrate  25 mg Oral TID   pantoprazole  40 mg Oral BID   polyvinyl alcohol  1 drop Both Eyes BID   potassium chloride  40 mEq Oral Q4H   Continuous Infusions:  cefTRIAXone (ROCEPHIN)  IV Stopped (01/10/23 1838)   heparin 950 Units/hr (01/11/23 0300)   magnesium sulfate bolus IVPB     potassium chloride       LOS: 2 days   Time spent: 35 minutes  Carollee Herter, DO  [facility]  01/11/2023, 8:36 AM

## 2023-01-11 NOTE — Care Management (Addendum)
Transition of Care [facility]) - Inpatient Brief Assessment   Patient Details  Name: Jillian Rodgers MRN: [id number] Date of Birth: June 08, 1945  Transition of Care [facility]) CM/SW Contact:    Lavenia Atlas, RN Phone Number: 01/11/2023, 6:00 PM   Clinical Narrative: Per chart review patient currently in [facility] SDU for Atrial fibrillation. Prior to admission patient has CPAP.   Transition of Care [facility]) Department has reviewed patient and no TOC needs have been identified at this time. We will continue to monitor patient advancement through Interdisciplinary progressions and if new patient needs arise, please place a consult.   Transition of Care Asessment: Insurance and Status: Insurance coverage has been reviewed Patient has primary care physician: Yes Home environment has been reviewed: from home with spouse Prior level of function:: independent Prior/Current Home Services: No current home services Social Determinants of Health Reivew: SDOH reviewed no interventions necessary Readmission risk has been reviewed: Yes Transition of care needs: no transition of care needs at this time

## 2023-01-11 NOTE — Assessment & Plan Note (Addendum)
Echo normal 01-12-2023. LVEF 60%

## 2023-01-11 NOTE — Progress Notes (Signed)
Rounding Note    Patient Name: Jillian Rodgers Date of Encounter: 01/11/2023  [facility] Cardiologist: Christell Constant, MD   Subjective   Creatinine stable at 0.88.  Hemoglobin 11.8 > 10.2; platelets stable (62 > 68 > 64).  BP 152/101.  Denies any chest pain or dyspnea  Inpatient Medications    Scheduled Meds:  atorvastatin  80 mg Oral Daily   Chlorhexidine Gluconate Cloth  6 each Topical Daily   divalproex  500 mg Oral Daily   gabapentin  300 mg Oral Daily   And   gabapentin  600 mg Oral QHS   levothyroxine  50 mcg Oral Q0600   loratadine  10 mg Oral Daily   metoprolol tartrate  25 mg Oral TID   pantoprazole  40 mg Oral BID   polyvinyl alcohol  1 drop Both Eyes BID   potassium chloride  40 mEq Oral Q4H   Continuous Infusions:  cefTRIAXone (ROCEPHIN)  IV Stopped (01/10/23 1838)   heparin 950 Units/hr (01/11/23 0900)   potassium chloride 10 mEq (01/11/23 1318)   PRN Meds: acetaminophen **OR** acetaminophen, albuterol, ondansetron **OR** ondansetron (ZOFRAN) IV, mouth rinse, traZODone   Vital Signs    Vitals:   01/11/23 0800 01/11/23 1000 01/11/23 1100 01/11/23 1200  BP: (!) 154/123 (!) 141/103 (!) 152/101   Pulse: (!) 114 (!) 124 97   Resp: 18 13 20    Temp:    98.2 F (36.8 C)  TempSrc:    Oral  SpO2: 91% 96% 98%   Weight:      Height:        Intake/Output Summary (Last 24 hours) at 01/11/2023 1345 Last data filed at 01/11/2023 0900 Gross per 24 hour  Intake 540.83 ml  Output --  Net 540.83 ml      01/10/2023    3:46 PM 01/09/2023    3:42 PM 01/09/2023    2:46 PM  Last 3 Weights  Weight (lbs) 155 lb 13.8 oz 152 lb 8 oz 152 lb 8 oz  Weight (kg) 70.7 kg 69.174 kg 69.174 kg      Telemetry    Afib 100-120s  - Personally Reviewed  ECG    NO new ECG - Personally Reviewed  Physical Exam   GEN: No acute distress.   Neck: No JVD Cardiac: Irregular, tachycardic, no murmurs Respiratory: Clear to auscultation bilaterally. GI:  Soft, nontender, non-distended  MS: No edema; No deformity. Neuro:  Nonfocal  Psych: Normal affect   Labs    High Sensitivity Troponin:   Recent Labs  Lab 01/09/23 1535 01/09/23 1803  TROPONINIHS 16 16     Chemistry Recent Labs  Lab 01/09/23 1535 01/10/23 0527 01/11/23 0040  NA 140 139 139  K 3.6 3.5 3.5  CL 106 106 105  CO2 25 21* 25  GLUCOSE 113* 98 111*  BUN 36* 28* 25*  CREATININE 1.40* 1.04* 0.88  CALCIUM 9.6 9.2 9.0  MG  --   --  1.9  PROT 6.9  --   --   ALBUMIN 3.2*  --   --   AST 24  --   --   ALT 21  --   --   ALKPHOS 57  --   --   BILITOT 0.4  --   --   GFRNONAA 39* 55* >60  ANIONGAP 9 12 9     Lipids No results for input(s): "CHOL", "TRIG", "HDL", "LABVLDL", "LDLCALC", "CHOLHDL" in the last 168 hours.  Hematology Recent  Labs  Lab 01/09/23 1535 01/10/23 0527 01/11/23 0040  WBC 8.9 8.8 8.7  RBC 4.21 3.90 3.34*  HGB 13.0 11.8* 10.2*  HCT 40.4 37.3 31.5*  MCV 96.0 95.6 94.3  MCH 30.9 30.3 30.5  MCHC 32.2 31.6 32.4  RDW 13.6 13.6 13.6  PLT 62* 68* 64*   Thyroid  Recent Labs  Lab 01/09/23 1535  TSH 5.083*    BNP Recent Labs  Lab 01/09/23 1535  BNP 601.9*    DDimer No results for input(s): "DDIMER" in the last 168 hours.   Radiology    DG Chest Port 1 View  Result Date: 01/09/2023 CLINICAL DATA:  Tachycardia and hypotension EXAM: PORTABLE CHEST 1 VIEW COMPARISON:  Chest radiograph dated 10/17/2022 FINDINGS: Normal lung volumes. Rounded retrocardiac opacity. No pleural effusion or pneumothorax. The heart size and mediastinal contours are within normal limits. No acute osseous abnormality. Right upper quadrant surgical clips. IMPRESSION: 1. Rounded retrocardiac opacity, likely hiatal hernia. 2.  No focal consolidations. Electronically Signed   By: Agustin Cree M.D.   On: 01/09/2023 17:46    Cardiac Studies     Patient Profile     77 y.o. female with a hx of chronic thrombocytopenia, paroxysmal atrial fibrillation, history of TIA,  hypothyroidism, hyperlipidemia, CKD stage III, hypertension who is being seen 01/10/2023 for the evaluation of atrial fibrillatio   Assessment & Plan    Atrial fibrillation with RVR: Diagnosed with A-fib 10/2020 and underwent successful DCCV at that time.  Was ultimately taken off of anticoagulation due to chronic thrombocytopenia.  Admitted 10/2022 with TIA.  Her CHA2DS2-VASc is now 6 (hypertension, TIA, age x 2, female).  Presented this admission with A-fib with RVR -Complicated situation, has not been on anticoagulation given her chronic thrombocytopenia, but with recent admission with TIA would ideally anticoagulate.  Discussed with her hematologist Dr. Al Pimple and okay with starting heparin drip to see if she tolerates.  If platelets stable and no bleeding, can plan to transition to Eliquis and will be monitored closely by Dr Al Pimple as outpatient.  So far tolerating heparin drip well, no bleeding and platelets stable in 60s.  Plan referral to Dr. Lalla Brothers as outpatient for consideration of Watchman -Continue metoprolol for rate control, will increase to 50 mg twice daily given elevated rates.  Will plan rate control for now, no plans for cardioversion as cannot guarantee she could complete 1 month of uninterrupted anticoagulation with her low platelet count  Thrombocytopenia: Follows with Dr Al Pimple.  Platelets have ranged from 39-72 over the last year, currently stable 160s this admission  Hypertension: Continue metoprolol  TIA: Continue Lipitor.  Stop aspirin with starting anticoagulation as above  For questions or updates, please contact [facility] Please consult [email or web address] for contact info under        Signed, [name], MD  01/11/2023, 1:45 PM

## 2023-01-11 NOTE — Progress Notes (Signed)
ANTICOAGULATION CONSULT NOTE   Pharmacy Consult for heparin Indication: atrial fibrillation  No Known Allergies  Patient Measurements: Height: 5\' 4"  (162.6 cm) Weight: 70.7 kg (155 lb 13.8 oz) IBW/kg (Calculated) : 54.7 Heparin Dosing Weight: 69.2 kg admit wt  Vital Signs: Temp: 98.1 F (36.7 C) (09/25 2346) Temp Source: Oral (09/25 2346) BP: 116/85 (09/26 0000) Pulse Rate: 100 (09/26 0000)  Labs: Recent Labs    01/09/23 1535 01/09/23 1803 01/10/23 0527 01/11/23 0040  HGB 13.0  --  11.8* 10.2*  HCT 40.4  --  37.3 31.5*  PLT 62*  --  68* 64*  LABPROT 12.6  --   --   --   INR 0.9  --   --   --   HEPARINUNFRC  --   --   --  0.53  CREATININE 1.40*  --  1.04* 0.88  TROPONINIHS 16 16  --   --     Estimated Creatinine Clearance: 50.8 mL/min (by C-G formula based on SCr of 0.88 mg/dL).   Medical History: Past Medical History:  Diagnosis Date   Anemia with low platelet count (HCC)    Chronic kidney disease    Degenerative joint disease    Dysrhythmia    a fib   GERD (gastroesophageal reflux disease)    History of kidney stones    Hyperlipidemia    Hypothyroidism    Inguinal hernia 02/19/2019   Labile hypertension    Migraines    neurontin helps   Pneumonia    hx of walking pneumonia    Primary hypertension 04/28/2022    Medications:  Medications Prior to Admission  Medication Sig Dispense Refill Last Dose   Ascorbic Acid (VITAMIN C) 500 MG CAPS Take 500 mg by mouth daily.    Past Week   aspirin EC 81 MG tablet Take 1 tablet (81 mg total) by mouth daily. Swallow whole. 30 tablet 12 01/08/2023 at 0800   atorvastatin (LIPITOR) 80 MG tablet Take 1 tablet (80 mg total) by mouth daily. 30 tablet 3 01/08/2023 at am   BOTOX 100 units SOLR injection Inject 100 Units into the muscle See admin instructions. Inject 100 units intramuscularly every 6-8 weeks   Past Month   cetirizine (ZYRTEC) 10 MG tablet Take 10 mg by mouth daily.   01/08/2023   Cholecalciferol (VITAMIN D3)  125 MCG (5000 UT) TABS Take 5,000 Units by mouth daily.   01/08/2023   divalproex (DEPAKOTE ER) 500 MG 24 hr tablet Take 1 tablet (500 mg total) by mouth daily. 30 tablet 3 01/08/2023   furosemide (LASIX) 20 MG tablet Take 1 tablet (20 mg total) by mouth daily as needed. (Patient taking differently: Take 20 mg by mouth daily as needed for fluid or edema.) 30 tablet 2 unk   gabapentin (NEURONTIN) 300 MG capsule Take 300 mg (1 tablet) in the morning and 600 mg (2 tablets) at bedtime (Patient taking differently: Take 300-600 mg by mouth See admin instructions. Take 300 mg by mouth in the morning and 600 mg at bedtime) 90 capsule 3 01/09/2023 at am   levothyroxine (SYNTHROID) 50 MCG tablet Take  1 tablet  Daily  on an empty stomach with only water for 30 minutes & no Antacid meds, Calcium or Magnesium for 4 hours & avoid Biotin                                                                                                                    /  TAKE                                         BY                                                 MOUTH (Patient taking differently: Take 50 mcg by mouth See admin instructions. Take 50 mcg by mouth in the morning on an empty stomach, with only water. Take no antacids, calcium, or magnesium for 4 hours and avoid biotin.) 90 tablet 3 01/08/2023 at am   meloxicam (MOBIC) 15 MG tablet Take 15 mg by mouth daily as needed for pain.   unk   pantoprazole (PROTONIX) 40 MG tablet Take  1 tablet  2 x /day  for Acid Indigestion & Heartburn (Patient taking differently: Take 40 mg by mouth in the morning and at bedtime.) 180 tablet 3 01/09/2023 at am   Probiotic Product (PROBIOTIC PO) Take 1 capsule by mouth daily.   01/08/2023   SYSTANE BALANCE 0.6 % SOLN Place 1 drop into both eyes at bedtime.   01/08/2023 at pm   SYSTANE ULTRA 0.4-0.3 % SOLN Place 1 drop into both eyes in the morning.   01/09/2023 at am   TYLENOL 500 MG tablet  Take 500-1,000 mg by mouth every 6 (six) hours as needed for mild pain or headache.   unk   Zinc 30 MG TABS Take 30 mg by mouth daily.   01/08/2023    Assessment: Pharmacy consulted to dose heparin for Afib in 77 yo F.   PMH significant for chronic thrombocytopenia & A fib first diagnosed 10/25/2020. She is currently not on any anticoagulants.  Per chart note from cards: "Discussed with patient's hematologist, Dr. Al Pimple, and okay with starting heparin drip and monitoring platelet count and for bleeding.  If drop in platelets less than 50 or any bleeding, would discontinue heparin.  If platelets stable and no bleeding, can plan to transition to Eliquis."  Baseline labs:  INR 0.8 PLT 62> 68 Hg 13> 11.8 SCr 1.4> 1.04   01/11/23 Heparin level = 0.53 (therapeutic) with heparin gtt @ 950 units/hr Hgb = 10.2, Pltc 64k Scr = 0.88 No complications of therapy noted  Goal of Therapy:  Heparin level 0.3-0.7 units/ml Monitor platelets by anticoagulation protocol: Yes   Plan:  Continue Heparin drip @ 950 units/hr  Recheck heparin level in 8 hr to confirm therapeutic dose Daily CBC & heparin level   Terrilee Files, PharmD 01/11/2023 1:24 AM

## 2023-01-12 ENCOUNTER — Other Ambulatory Visit (HOSPITAL_COMMUNITY): Payer: Self-pay

## 2023-01-12 ENCOUNTER — Inpatient Hospital Stay (HOSPITAL_COMMUNITY): Payer: Medicare PPO

## 2023-01-12 DIAGNOSIS — N3000 Acute cystitis without hematuria: Secondary | ICD-10-CM | POA: Diagnosis not present

## 2023-01-12 DIAGNOSIS — I4891 Unspecified atrial fibrillation: Secondary | ICD-10-CM | POA: Diagnosis not present

## 2023-01-12 DIAGNOSIS — Z8673 Personal history of transient ischemic attack (TIA), and cerebral infarction without residual deficits: Secondary | ICD-10-CM | POA: Diagnosis not present

## 2023-01-12 DIAGNOSIS — N1831 Chronic kidney disease, stage 3a: Secondary | ICD-10-CM | POA: Diagnosis not present

## 2023-01-12 DIAGNOSIS — D696 Thrombocytopenia, unspecified: Secondary | ICD-10-CM | POA: Diagnosis not present

## 2023-01-12 LAB — BASIC METABOLIC PANEL
Anion gap: 9 (ref 5–15)
BUN: 23 mg/dL (ref 8–23)
CO2: 23 mmol/L (ref 22–32)
Calcium: 9.1 mg/dL (ref 8.9–10.3)
Chloride: 106 mmol/L (ref 98–111)
Creatinine, Ser: 1.09 mg/dL — ABNORMAL HIGH (ref 0.44–1.00)
GFR, Estimated: 52 mL/min — ABNORMAL LOW (ref >60)
Glucose, Bld: 104 mg/dL — ABNORMAL HIGH (ref 70–99)
Potassium: 4.9 mmol/L (ref 3.5–5.1)
Sodium: 138 mmol/L (ref 135–145)

## 2023-01-12 LAB — CBC
HCT: 35.9 % — ABNORMAL LOW (ref 36.0–46.0)
Hemoglobin: 11 g/dL — ABNORMAL LOW (ref 12.0–15.0)
MCH: 30.9 pg (ref 26.0–34.0)
MCHC: 30.6 g/dL (ref 30.0–36.0)
MCV: 100.8 fL — ABNORMAL HIGH (ref 80.0–100.0)
Platelets: 68 10*3/uL — ABNORMAL LOW (ref 150–400)
RBC: 3.56 MIL/uL — ABNORMAL LOW (ref 3.87–5.11)
RDW: 13.7 % (ref 11.5–15.5)
WBC: 10.1 10*3/uL (ref 4.0–10.5)
nRBC: 0 % (ref 0.0–0.2)

## 2023-01-12 LAB — MAGNESIUM: Magnesium: 2.4 mg/dL (ref 1.7–2.4)

## 2023-01-12 LAB — ECHOCARDIOGRAM LIMITED
Height: 64 in
S' Lateral: 2.8 cm
Weight: 2493.84 [oz_av]

## 2023-01-12 LAB — HEPARIN LEVEL (UNFRACTIONATED): Heparin Unfractionated: 0.49 [IU]/mL (ref 0.30–0.70)

## 2023-01-12 MED ORDER — DILTIAZEM HCL 60 MG PO TABS: 60.0000 mg | ORAL_TABLET | Freq: Three times a day (TID) | ORAL | Status: DC

## 2023-01-12 MED ORDER — APIXABAN 5 MG PO TABS
5.0000 mg | ORAL_TABLET | Freq: Two times a day (BID) | ORAL | Status: DC
  Administered 2023-01-12 – 2023-01-13 (×3): 5 mg via ORAL
  Filled 2023-01-12 (×3): qty 1

## 2023-01-12 MED ORDER — DILTIAZEM HCL 30 MG PO TABS
30.0000 mg | ORAL_TABLET | Freq: Four times a day (QID) | ORAL | Status: DC
  Administered 2023-01-12: 30 mg via ORAL
  Filled 2023-01-12: qty 1

## 2023-01-12 NOTE — Discharge Instructions (Signed)

## 2023-01-12 NOTE — Plan of Care (Signed)

## 2023-01-12 NOTE — TOC Benefit Eligibility Note (Signed)
 Patient Product/process development scientist completed.    The patient is insured through [employer]. Patient has Medicare and is not eligible for a copay card, but may be able to apply for patient assistance, if available.    Ran test claim for Eliquis 5 mg and the current 30 day co-pay is $40.00.   This test claim was processed through [facility]- copay amounts may vary at other pharmacies due to pharmacy/plan contracts, or as the patient moves through the different stages of their insurance plan.     Roland Earl, CPHT Pharmacy Technician III Certified Patient Advocate [facility] Patient Advocate Team Direct Number: [phone]  Fax: [phone]

## 2023-01-12 NOTE — Progress Notes (Signed)
ANTICOAGULATION CONSULT NOTE   Pharmacy Consult for heparin>> apixaban Indication: atrial fibrillation  No Known Allergies  Patient Measurements: Height: 5\' 4"  (162.6 cm) Weight: 70.7 kg (155 lb 13.8 oz) IBW/kg (Calculated) : 54.7 Heparin Dosing Weight: 69.2 kg  Vital Signs: Temp: 98.5 F (36.9 C) (09/27 0800) Temp Source: Oral (09/27 0800) BP: 145/100 (09/27 0800) Pulse Rate: 119 (09/27 0800)  Labs: Recent Labs    01/09/23 1535 01/09/23 1803 01/10/23 0527 01/11/23 0040 01/11/23 0924 01/12/23 0258  HGB 13.0  --  11.8* 10.2*  --  11.0*  HCT 40.4  --  37.3 31.5*  --  35.9*  PLT 62*  --  68* 64*  --  68*  LABPROT 12.6  --   --   --   --   --   INR 0.9  --   --   --   --   --   HEPARINUNFRC  --   --   --  0.53 0.54 0.49  CREATININE 1.40*  --  1.04* 0.88  --  1.09*  TROPONINIHS 16 16  --   --   --   --     Estimated Creatinine Clearance: 41 mL/min (A) (by C-G formula based on SCr of 1.09 mg/dL (H)).   Assessment: Pharmacy consulted to dose heparin for Afib in 77 yo F. PMH significant for chronic thrombocytopenia & A fib first diagnosed 10/25/2020. She is currently not on any anticoagulants.  Per chart note from Cardiology: "Discussed with patient's hematologist, Dr. Al Pimple, and okay with starting heparin drip and monitoring platelet count and for bleeding. If drop in platelets less than 50 or any bleeding, would discontinue heparin.  If platelets stable and no bleeding, can plan to transition to Eliquis."  01/12/23 Heparin level 0.49 - therapeutic with heparin infusion at 950 units/hr Hgb 11 - stable, plt 68, low but stable No complications of therapy noted To transition to apixaban  On depakote:  valproate products may diminish the therapeutic effect of apixa. Risk C> monitor therapy.  Not contra-indicated  Goal of Therapy:  Heparin level 0.3-0.7 units/ml Monitor platelets by anticoagulation protocol: Yes   Plan:  Dc  heparin drip Apixaban 5 mg po bid DC heparin  levels Continue CBCs to monitor PLT  Herby Abraham, Pharm.D Use secure chat for questions 01/12/2023 9:01 AM

## 2023-01-12 NOTE — Progress Notes (Signed)
PROGRESS NOTE    Jillian Rodgers  YQM:[id number] DOB: [birth year] DOA: 01/09/2023 PCP: [name], MD  Subjective: Pt seen and examined. Cards increased lopressor to 50 mg bid yesterday.  Not a candidate for DCCV due to pt's unknown ability to tolerate systemic anticoagulation for 1 month after DCCV.  HR remains elevated this AM. Ranging 110s-130s while lying in bed. No SOB. No chest pain.   Platelet count stable on IV heparin   Hospital Course: HPI: Jillian Rodgers is a 78 y.o. female with medical history significant for chronic thrombocytopenia, hyperlipidemia, hypothyroidism, CKD stage III, TIA on aspirin, paroxysmal atrial fibrillation not on anticoagulation admitted to the hospital with A-fib with RVR.  She underwent successful DCCV on 10/25/2020 and at the time was on Eliquis, she wore a cardiac monitor later that year and was taken off Eliquis due to no further atrial fibrillation and chronic thrombocytopenia for which she is followed by hematology.  She had no known recurrence of atrial fibrillation, was hospitalized in July for TIA and since then has been on aspirin.  She had unremarkable echocardiography during that hospital stay than grade 1 diastolic dysfunction.  For the last week or so, she has been having burning with urination, and for the last couple of weeks she has been lethargic, feeling dizzy and lightheaded with exertion (husband at bedside states that she has sometimes been dizzy and lightheaded even when lying in bed), has not felt any palpitations, or chest pain.  When she had atrial fibrillation back in 2022 she had palpitations.  This time is different.  She had a follow-up appointment with her hematologist, who noted that the patient was tachycardic, and mentioned her symptoms.  She was sent to the ER for evaluation.   Significant Events: Admitted 01/09/2023 for rapid afib and UTI   Significant Labs: Admitting urine cx growing GNR  Significant Imaging  Studies:   Antibiotic Therapy: Anti-infectives (From admission, onward)    Start     Dose/Rate Route Frequency Ordered Stop   01/10/23 1830  cefTRIAXone (ROCEPHIN) 1 g in sodium chloride 0.9 % 100 mL IVPB        1 g 200 mL/hr over 30 Minutes Intravenous Every 24 hours 01/09/23 2103     01/09/23 1815  cefTRIAXone (ROCEPHIN) 1 g in sodium chloride 0.9 % 100 mL IVPB        1 g 200 mL/hr over 30 Minutes Intravenous  Once 01/09/23 1814 01/09/23 2058       Procedures:   Consultants: Cardiology    Assessment and Plan: * Atrial fibrillation with RVR (HCC) Admitted for rapid afib. Started on IV cardizem. Cardiology consulted. Had echo in 10-2022 for her TIA workup that showed normal LVEF. Cards ordered limited echo.  Discussed with cards/Dr. Bjorn Pippin. Ok to start eliquis and stop IV heparin. Pt has tolerated IV heparin for 3 days now without significant drop in platelet count. Also since HR still >120s, will start cardizem po 30 mg q6h in attempt for better HR control  Keep K >4.0 and Mg >2.0  Acute cystitis without hematuria On IV rocephin. Urine cx growing Proteus.  Sensitive to Rocephin.  3 days of IV rocephin should be sufficient. Pt no longer having any dysuria.  Thrombocytopenia (HCC) Chronic. Follows with heme/onc. Stable on IV heparin for 3 days. Changing to Eliquis for systemic anticoagulation for CVA prophylaxis.  Primary hypertension On po lopressor 50 mg bid  Adding cardizem 30 mg q6h for rate control. Monitor BP  CKD stage 3a, GFR 45-59 ml/min (HCC) - baseline SCr 1.1-1.4 Stable.  Hypothyroidism Stable. On synthroid 50 mg daily.  Hyperlipidemia On lipitor 80 mg daily.  Hypokalemia Repleted with po and IV Kcl. Resolved.  History of TIA (transient ischemic attack) Echo normal in 10-2022       DVT prophylaxis: SCDs Start: 01/09/23 2001 apixaban (ELIQUIS) tablet 5 mg     Code Status: Full Code Family Communication: no family at bedside Disposition  Plan: return home Reason for continuing need for hospitalization: afib remains uncontrolled. Starting po cardizem today. Changing from IV heparin to po eliquis.  Objective: Vitals:   01/12/23 0300 01/12/23 0400 01/12/23 0700 01/12/23 0800  BP: (!) 124/93 (!) 120/100 (!) 160/107 (!) 145/100  Pulse: (!) 101 (!) 103 (!) 118 (!) 119  Resp: (!) 26 (!) 22 (!) 21 (!) 26  Temp: 98.5 F (36.9 C)   98.5 F (36.9 C)  TempSrc: Oral   Oral  SpO2: (!) 89% 90% (!) 87% 100%  Weight:      Height:        Intake/Output Summary (Last 24 hours) at 01/12/2023 1013 Last data filed at 01/12/2023 0800 Gross per 24 hour  Intake 530.51 ml  Output --  Net 530.51 ml   Filed Weights   01/09/23 1542 01/10/23 1546  Weight: 69.2 kg 70.7 kg    Examination:  Physical Exam Vitals and nursing note reviewed.  Constitutional:      General: She is not in acute distress.    Appearance: Normal appearance. She is not toxic-appearing or diaphoretic.  HENT:     Head: Normocephalic and atraumatic.     Nose: Nose normal.  Eyes:     General: No scleral icterus. Cardiovascular:     Rate and Rhythm: Tachycardia present. Rhythm irregular.  Pulmonary:     Effort: Pulmonary effort is normal. No respiratory distress.     Breath sounds: Normal breath sounds.  Abdominal:     General: Abdomen is flat. Bowel sounds are normal. There is no distension.     Palpations: Abdomen is soft.  Musculoskeletal:     Right lower leg: No edema.     Left lower leg: No edema.  Skin:    General: Skin is warm and dry.     Capillary Refill: Capillary refill takes less than 2 seconds.  Neurological:     General: No focal deficit present.     Mental Status: She is alert and oriented to person, place, and time.     Data Reviewed: I have personally reviewed following labs and imaging studies  CBC: Recent Labs  Lab 01/09/23 1416 01/09/23 1535 01/10/23 0527 01/11/23 0040 01/12/23 0258  WBC 8.8 8.9 8.8 8.7 10.1  NEUTROABS 6.2  6.5  --   --   --   HGB 12.8 13.0 11.8* 10.2* 11.0*  HCT 39.5 40.4 37.3 31.5* 35.9*  MCV 94.5 96.0 95.6 94.3 100.8*  PLT 62* 62* 68* 64* 68*   Basic Metabolic Panel: Recent Labs  Lab 01/09/23 1535 01/10/23 0527 01/11/23 0040 01/12/23 0258  NA 140 139 139 138  K 3.6 3.5 3.5 4.9  CL 106 106 105 106  CO2 25 21* 25 23  GLUCOSE 113* 98 111* 104*  BUN 36* 28* 25* 23  CREATININE 1.40* 1.04* 0.88 1.09*  CALCIUM 9.6 9.2 9.0 9.1  MG  --   --  1.9 2.4   GFR: Estimated Creatinine Clearance: 41 mL/min (A) (by C-G formula based on SCr of  1.09 mg/dL (H)). Liver Function Tests: Recent Labs  Lab 01/09/23 1535  AST 24  ALT 21  ALKPHOS 57  BILITOT 0.4  PROT 6.9  ALBUMIN 3.2*   Coagulation Profile: Recent Labs  Lab 01/09/23 1535  INR 0.9   Thyroid Function Tests: Recent Labs    01/09/23 1535  TSH 5.083*   Sepsis Labs: Recent Labs  Lab 01/09/23 1630  LATICACIDVEN 1.3    Recent Results (from the past 240 hour(s))  Resp panel by RT-PCR (RSV, Flu A&B, Covid) Anterior Nasal Swab     Status: None   Collection Time: 01/09/23  4:20 PM   Specimen: Anterior Nasal Swab  Result Value Ref Range Status   SARS Coronavirus 2 by RT PCR NEGATIVE NEGATIVE Final    Comment: (NOTE) SARS-CoV-2 target nucleic acids are NOT DETECTED.  The SARS-CoV-2 RNA is generally detectable in upper respiratory specimens during the acute phase of infection. The lowest concentration of SARS-CoV-2 viral copies this assay can detect is 138 copies/mL. A negative result does not preclude SARS-Cov-2 infection and should not be used as the sole basis for treatment or other patient management decisions. A negative result may occur with  improper specimen collection/handling, submission of specimen other than nasopharyngeal swab, presence of viral mutation(s) within the areas targeted by this assay, and inadequate number of viral copies(<138 copies/mL). A negative result must be combined with clinical  observations, patient history, and epidemiological information. The expected result is Negative.  Fact Sheet for Patients:  BloggerCourse.com  Fact Sheet for Healthcare Providers:  SeriousBroker.it  This test is no t yet approved or cleared by the [place] FDA and  has been authorized for detection and/or diagnosis of SARS-CoV-2 by FDA under an Emergency Use Authorization (EUA). This EUA will remain  in effect (meaning this test can be used) for the duration of the COVID-19 declaration under Section 564(b)(1) of the Act, 21 U.S.C.section 360bbb-3(b)(1), unless the authorization is terminated  or revoked sooner.       Influenza A by PCR NEGATIVE NEGATIVE Final   Influenza B by PCR NEGATIVE NEGATIVE Final    Comment: (NOTE) The Xpert Xpress SARS-CoV-2/FLU/RSV plus assay is intended as an aid in the diagnosis of influenza from Nasopharyngeal swab specimens and should not be used as a sole basis for treatment. Nasal washings and aspirates are unacceptable for Xpert Xpress SARS-CoV-2/FLU/RSV testing.  Fact Sheet for Patients: BloggerCourse.com  Fact Sheet for Healthcare Providers: SeriousBroker.it  This test is not yet approved or cleared by the [place] FDA and has been authorized for detection and/or diagnosis of SARS-CoV-2 by FDA under an Emergency Use Authorization (EUA). This EUA will remain in effect (meaning this test can be used) for the duration of the COVID-19 declaration under Section 564(b)(1) of the Act, 21 U.S.C. section 360bbb-3(b)(1), unless the authorization is terminated or revoked.     Resp Syncytial Virus by PCR NEGATIVE NEGATIVE Final    Comment: (NOTE) Fact Sheet for Patients: BloggerCourse.com  Fact Sheet for Healthcare Providers: SeriousBroker.it  This test is not yet approved or cleared by  the [place] FDA and has been authorized for detection and/or diagnosis of SARS-CoV-2 by FDA under an Emergency Use Authorization (EUA). This EUA will remain in effect (meaning this test can be used) for the duration of the COVID-19 declaration under Section 564(b)(1) of the Act, 21 U.S.C. section 360bbb-3(b)(1), unless the authorization is terminated or revoked.  Performed at [facility], [address] [place], [place]  [address]   Urine Culture     Status: Abnormal   Collection Time: 01/09/23  4:20 PM   Specimen: Urine, Clean Catch  Result Value Ref Range Status   Specimen Description   Final    URINE, CLEAN CATCH Performed at [facility], [address], [place], [place] [address]    Special Requests   Final    NONE Performed at [facility], [address], [place], [place] [address]    Culture 40,000 COLONIES/mL PROTEUS MIRABILIS (A)  Final   Report Status 01/11/2023 FINAL  Final   Organism ID, Bacteria PROTEUS MIRABILIS (A)  Final      Susceptibility   Proteus mirabilis - MIC*    AMPICILLIN <=2 SENSITIVE Sensitive     CEFAZOLIN <=4 SENSITIVE Sensitive     CEFEPIME <=0.12 SENSITIVE Sensitive     CEFTRIAXONE <=0.25 SENSITIVE Sensitive     CIPROFLOXACIN <=0.25 SENSITIVE Sensitive     GENTAMICIN <=1 SENSITIVE Sensitive     IMIPENEM 2 SENSITIVE Sensitive     NITROFURANTOIN RESISTANT Resistant     TRIMETH/SULFA <=20 SENSITIVE Sensitive     AMPICILLIN/SULBACTAM <=2 SENSITIVE Sensitive     PIP/TAZO <=4 SENSITIVE Sensitive     * 40,000 COLONIES/mL PROTEUS MIRABILIS  MRSA Next Gen by PCR, Nasal     Status: None   Collection Time: 01/10/23  3:58 PM   Specimen: Nasal Mucosa; Nasal Swab  Result Value Ref Range Status   MRSA by PCR Next Gen NOT DETECTED NOT DETECTED Final    Comment: (NOTE) The GeneXpert MRSA Assay (FDA approved for NASAL specimens only), is one component of a comprehensive MRSA colonization  surveillance program. It is not intended to diagnose MRSA infection nor to guide or monitor treatment for MRSA infections. Test performance is not FDA approved in patients less than 32 years old. Performed at [facility], [address], [place], [place] [address]      Radiology Studies: No results found.  Scheduled Meds:  apixaban  5 mg Oral BID   atorvastatin  80 mg Oral Daily   Chlorhexidine Gluconate Cloth  6 each Topical Daily   diltiazem  30 mg Oral Q6H   divalproex  500 mg Oral Daily   gabapentin  300 mg Oral Daily   And   gabapentin  600 mg Oral QHS   levothyroxine  50 mcg Oral Q0600   loratadine  10 mg Oral Daily   metoprolol tartrate  50 mg Oral BID   pantoprazole  40 mg Oral BID   polyvinyl alcohol  1 drop Both Eyes BID   Continuous Infusions:  cefTRIAXone (ROCEPHIN)  IV Stopped (01/11/23 1827)     LOS: 3 days   Time spent: 35 minutes  Carollee Herter, DO  [facility]  01/12/2023, 10:13 AM

## 2023-01-12 NOTE — Progress Notes (Signed)
Rounding Note    Patient Name: Jillian Rodgers: 01/12/2023  [facility] Cardiologist: Christell Constant, MD   Subjective   BP 145/100 Hemoglobin 11.8 > 10.2 >11.0; platelets stable (62 > 68 > 64 >68).   Denies any chest pain or dyspnea.  Converted to sinus rhythm.  Inpatient Medications    Scheduled Meds:  apixaban  5 mg Oral BID   atorvastatin  80 mg Oral Daily   Chlorhexidine Gluconate Cloth  6 each Topical Daily   diltiazem  30 mg Oral Q6H   divalproex  500 mg Oral Daily   gabapentin  300 mg Oral Daily   And   gabapentin  600 mg Oral QHS   levothyroxine  50 mcg Oral Q0600   loratadine  10 mg Oral Daily   metoprolol tartrate  50 mg Oral BID   pantoprazole  40 mg Oral BID   polyvinyl alcohol  1 drop Both Eyes BID   Continuous Infusions:  cefTRIAXone (ROCEPHIN)  IV Stopped (01/11/23 1827)   PRN Meds: acetaminophen **OR** acetaminophen, albuterol, ondansetron **OR** ondansetron (ZOFRAN) IV, mouth rinse, traZODone   Vital Signs    Vitals:   01/12/23 0300 01/12/23 0400 01/12/23 0700 01/12/23 0800  BP: (!) 124/93 (!) 120/100 (!) 160/107 (!) 145/100  Pulse: (!) 101 (!) 103 (!) 118 (!) 119  Resp: (!) 26 (!) 22 (!) 21 (!) 26  Temp: 98.5 F (36.9 C)   98.5 F (36.9 C)  TempSrc: Oral   Oral  SpO2: (!) 89% 90% (!) 87% 100%  Weight:      Height:        Intake/Output Summary (Last 24 hours) at 01/12/2023 1027 Last data filed at 01/12/2023 0800 Gross per 24 hour  Intake 530.51 ml  Output --  Net 530.51 ml      01/10/2023    3:46 PM 01/09/2023    3:42 PM 01/09/2023    2:46 PM  Last 3 Weights  Weight (lbs) 155 lb 13.8 oz 152 lb 8 oz 152 lb 8 oz  Weight (kg) 70.7 kg 69.174 kg 69.174 kg      Telemetry    Afib 120s, converted this AM to sinus rhythm with rate 60s  - Personally Reviewed  ECG    No new ECG - Personally Reviewed  Physical Exam   GEN: No acute distress.   Neck: No JVD Cardiac: RRR no murmurs Respiratory:  Clear to auscultation bilaterally. GI: Soft, nontender, non-distended  MS: No edema; No deformity. Neuro:  Nonfocal  Psych: Normal affect   Labs    High Sensitivity Troponin:   Recent Labs  Lab 01/09/23 1535 01/09/23 1803  TROPONINIHS 16 16     Chemistry Recent Labs  Lab 01/09/23 1535 01/10/23 0527 01/11/23 0040 01/12/23 0258  NA 140 139 139 138  K 3.6 3.5 3.5 4.9  CL 106 106 105 106  CO2 25 21* 25 23  GLUCOSE 113* 98 111* 104*  BUN 36* 28* 25* 23  CREATININE 1.40* 1.04* 0.88 1.09*  CALCIUM 9.6 9.2 9.0 9.1  MG  --   --  1.9 2.4  PROT 6.9  --   --   --   ALBUMIN 3.2*  --   --   --   AST 24  --   --   --   ALT 21  --   --   --   ALKPHOS 57  --   --   --  BILITOT 0.4  --   --   --   GFRNONAA 39* 55* >60 52*  ANIONGAP 9 12 9 9     Lipids No results for input(s): "CHOL", "TRIG", "HDL", "LABVLDL", "LDLCALC", "CHOLHDL" in the last 168 hours.  Hematology Recent Labs  Lab 01/10/23 0527 01/11/23 0040 01/12/23 0258  WBC 8.8 8.7 10.1  RBC 3.90 3.34* 3.56*  HGB 11.8* 10.2* 11.0*  HCT 37.3 31.5* 35.9*  MCV 95.6 94.3 100.8*  MCH 30.3 30.5 30.9  MCHC 31.6 32.4 30.6  RDW 13.6 13.6 13.7  PLT 68* 64* 68*   Thyroid  Recent Labs  Lab 01/09/23 1535  TSH 5.083*    BNP Recent Labs  Lab 01/09/23 1535  BNP 601.9*    DDimer No results for input(s): "DDIMER" in the last 168 hours.   Radiology    No results found.  Cardiac Studies     Patient Profile     77 y.o. female with a hx of chronic thrombocytopenia, paroxysmal atrial fibrillation, history of TIA, hypothyroidism, hyperlipidemia, CKD stage III, hypertension who is being seen 01/10/2023 for the evaluation of atrial fibrillatio   Assessment & Plan    Atrial fibrillation with RVR: Diagnosed with A-fib 10/2020 and underwent successful DCCV at that time.  Was ultimately taken off of anticoagulation due to chronic thrombocytopenia.  Admitted 10/2022 with TIA.  Her CHA2DS2-VASc is now 6 (hypertension, TIA, age x 2,  female).  Presented this admission with A-fib with RVR -Complicated situation, has not been on anticoagulation given her chronic thrombocytopenia, but with recent admission with TIA would ideally anticoagulate.  Discussed with her hematologist Dr. Al Pimple and okay with starting heparin drip to see if she tolerates.  If platelets stable and no bleeding, can plan to transition to Eliquis and will be monitored closely by Dr Al Pimple as outpatient.  Tolerated heparin drip well, no bleeding and platelets stable in 60s.  Will switch to Eliquis.  Plan referral to Dr. Lalla Brothers as outpatient for consideration of Watchman -Continue metoprolol 50 mg BID  Thrombocytopenia: Follows with Dr Al Pimple.  Platelets have ranged from 39-72 over the last year, currently stable in 60s this admission  Hypertension: Continue metoprolol  TIA: Continue Lipitor.  Stop aspirin with starting anticoagulation as above  For questions or updates, please contact  [facility] Please consult [email or web address] for contact info under        Signed, [name], MD  01/12/2023, 10:27 AM

## 2023-01-13 DIAGNOSIS — N1831 Chronic kidney disease, stage 3a: Secondary | ICD-10-CM | POA: Diagnosis not present

## 2023-01-13 DIAGNOSIS — I4891 Unspecified atrial fibrillation: Secondary | ICD-10-CM | POA: Diagnosis not present

## 2023-01-13 DIAGNOSIS — N3000 Acute cystitis without hematuria: Secondary | ICD-10-CM | POA: Diagnosis not present

## 2023-01-13 DIAGNOSIS — Z8673 Personal history of transient ischemic attack (TIA), and cerebral infarction without residual deficits: Secondary | ICD-10-CM | POA: Diagnosis not present

## 2023-01-13 DIAGNOSIS — D696 Thrombocytopenia, unspecified: Secondary | ICD-10-CM | POA: Diagnosis not present

## 2023-01-13 LAB — CBC
HCT: 33 % — ABNORMAL LOW (ref 36.0–46.0)
Hemoglobin: 10.2 g/dL — ABNORMAL LOW (ref 12.0–15.0)
MCH: 30.9 pg (ref 26.0–34.0)
MCHC: 30.9 g/dL (ref 30.0–36.0)
MCV: 100 fL (ref 80.0–100.0)
Platelets: 69 10*3/uL — ABNORMAL LOW (ref 150–400)
RBC: 3.3 MIL/uL — ABNORMAL LOW (ref 3.87–5.11)
RDW: 13.9 % (ref 11.5–15.5)
WBC: 9 10*3/uL (ref 4.0–10.5)
nRBC: 0 % (ref 0.0–0.2)

## 2023-01-13 LAB — BASIC METABOLIC PANEL
Anion gap: 8 (ref 5–15)
BUN: 25 mg/dL — ABNORMAL HIGH (ref 8–23)
CO2: 24 mmol/L (ref 22–32)
Calcium: 9.2 mg/dL (ref 8.9–10.3)
Chloride: 106 mmol/L (ref 98–111)
Creatinine, Ser: 1.04 mg/dL — ABNORMAL HIGH (ref 0.44–1.00)
GFR, Estimated: 55 mL/min — ABNORMAL LOW (ref >60)
Glucose, Bld: 98 mg/dL (ref 70–99)
Potassium: 4.3 mmol/L (ref 3.5–5.1)
Sodium: 138 mmol/L (ref 135–145)

## 2023-01-13 MED ORDER — POTASSIUM CHLORIDE CRYS ER 20 MEQ PO TBCR: 20.0000 meq | EXTENDED_RELEASE_TABLET | Freq: Every day | ORAL | 0 refills | Status: DC

## 2023-01-13 MED ORDER — METOPROLOL TARTRATE 50 MG PO TABS: 50.0000 mg | ORAL_TABLET | Freq: Two times a day (BID) | ORAL | 0 refills | Status: DC

## 2023-01-13 MED ORDER — APIXABAN 5 MG PO TABS: 5.0000 mg | ORAL_TABLET | Freq: Two times a day (BID) | ORAL | 0 refills | Status: DC

## 2023-01-13 MED ORDER — MAGNESIUM OXIDE 400 MG PO CAPS: 400.0000 mg | ORAL_CAPSULE | Freq: Every day | ORAL | Status: AC

## 2023-01-13 NOTE — Plan of Care (Signed)
  Problem: Clinical Measurements: Goal: Ability to maintain clinical measurements within normal limits will improve Outcome: Progressing Goal: Will remain free from infection Outcome: Progressing Goal: Diagnostic test results will improve Outcome: Progressing Goal: Respiratory complications will improve Outcome: Progressing Goal: Cardiovascular complication will be avoided Outcome: Progressing   Problem: Activity: Goal: Risk for activity intolerance will decrease Outcome: Progressing   Problem: Coping: Goal: Level of anxiety will decrease Outcome: Progressing   Problem: Safety: Goal: Ability to remain free from injury will improve Outcome: Progressing   Problem: Pain Managment: Goal: General experience of comfort will improve Outcome: Progressing

## 2023-01-13 NOTE — Progress Notes (Signed)
Rounding Note    Patient Name: Jillian Rodgers Date of Encounter: 01/13/2023  [facility] Cardiologist: Christell Constant, MD   Subjective   BP 145/100 Hemoglobin 11.8 > 10.2 >11.0>10.2; platelets stable (62 > 68 > 64 >68>69).   Denies any chest pain or dyspnea.    Inpatient Medications    Scheduled Meds:  apixaban  5 mg Oral BID   atorvastatin  80 mg Oral Daily   Chlorhexidine Gluconate Cloth  6 each Topical Daily   divalproex  500 mg Oral Daily   gabapentin  300 mg Oral Daily   And   gabapentin  600 mg Oral QHS   levothyroxine  50 mcg Oral Q0600   loratadine  10 mg Oral Daily   metoprolol tartrate  50 mg Oral BID   pantoprazole  40 mg Oral BID   polyvinyl alcohol  1 drop Both Eyes BID   Continuous Infusions:   PRN Meds: acetaminophen **OR** acetaminophen, albuterol, ondansetron **OR** ondansetron (ZOFRAN) IV, mouth rinse, traZODone   Vital Signs    Vitals:   01/12/23 2119 01/13/23 0216 01/13/23 0556 01/13/23 0630  BP: (!) 128/90 122/76  (!) 148/99  Pulse: 66 62 63   Resp: 18 19 17    Temp: 98.7 F (37.1 C) 98.6 F (37 C) 98.2 F (36.8 C)   TempSrc: Oral Oral Oral   SpO2: 96% 97% 100%   Weight:      Height:        Intake/Output Summary (Last 24 hours) at 01/13/2023 0843 Last data filed at 01/12/2023 2148 Gross per 24 hour  Intake 100 ml  Output --  Net 100 ml      01/10/2023    3:46 PM 01/09/2023    3:42 PM 01/09/2023    2:46 PM  Last 3 Weights  Weight (lbs) 155 lb 13.8 oz 152 lb 8 oz 152 lb 8 oz  Weight (kg) 70.7 kg 69.174 kg 69.174 kg      Telemetry    NSR - Personally Reviewed  ECG    No new ECG - Personally Reviewed  Physical Exam   GEN: No acute distress.   Neck: No JVD Cardiac: RRR no murmurs Respiratory: Clear to auscultation bilaterally. GI: Soft, nontender, non-distended  MS: No edema; No deformity. Neuro:  Nonfocal  Psych: Normal affect   Labs    High Sensitivity Troponin:   Recent Labs  Lab  01/09/23 1535 01/09/23 1803  TROPONINIHS 16 16     Chemistry Recent Labs  Lab 01/09/23 1535 01/10/23 0527 01/11/23 0040 01/12/23 0258 01/13/23 0537  NA 140   < > 139 138 138  K 3.6   < > 3.5 4.9 4.3  CL 106   < > 105 106 106  CO2 25   < > 25 23 24   GLUCOSE 113*   < > 111* 104* 98  BUN 36*   < > 25* 23 25*  CREATININE 1.40*   < > 0.88 1.09* 1.04*  CALCIUM 9.6   < > 9.0 9.1 9.2  MG  --   --  1.9 2.4  --   PROT 6.9  --   --   --   --   ALBUMIN 3.2*  --   --   --   --   AST 24  --   --   --   --   ALT 21  --   --   --   --   ALKPHOS 57  --   --   --   --  BILITOT 0.4  --   --   --   --   GFRNONAA 39*   < > >60 52* 55*  ANIONGAP 9   < > 9 9 8    < > = values in this interval not displayed.    Lipids No results for input(s): "CHOL", "TRIG", "HDL", "LABVLDL", "LDLCALC", "CHOLHDL" in the last 168 hours.  Hematology Recent Labs  Lab 01/11/23 0040 01/12/23 0258 01/13/23 0537  WBC 8.7 10.1 9.0  RBC 3.34* 3.56* 3.30*  HGB 10.2* 11.0* 10.2*  HCT 31.5* 35.9* 33.0*  MCV 94.3 100.8* 100.0  MCH 30.5 30.9 30.9  MCHC 32.4 30.6 30.9  RDW 13.6 13.7 13.9  PLT 64* 68* 69*   Thyroid  Recent Labs  Lab 01/09/23 1535  TSH 5.083*    BNP Recent Labs  Lab 01/09/23 1535  BNP 601.9*    DDimer No results for input(s): "DDIMER" in the last 168 hours.   Radiology    ECHOCARDIOGRAM LIMITED  Result Date: 01/12/2023    ECHOCARDIOGRAM LIMITED REPORT   Patient Name:   Jillian Rodgers Date of Exam: 01/12/2023 Medical Rec #:  [id number]         Height:       64.0 in Accession #:    [id number]        Weight:       155.9 lb Date of Birth:  1946/03/11         BSA:          1.760 m Patient Age:    77 years          BP:           117/87 mmHg Patient Gender: F                 HR:           72 bpm. Exam Location:  Inpatient Procedure: Limited Echo Indications:    Atrial Fibrillation I48.91  History:        Patient has prior history of Echocardiogram examinations, most                 recent  10/18/2022. Risk Factors:Hypertension.  Sonographer:    Darlys Gales Referring Phys: [id number] Jarid Sasso L Zamzam Whinery IMPRESSIONS  1. Left ventricular ejection fraction, by estimation, is 60 to 65%. The left ventricle has normal function. The left ventricle has no regional wall motion abnormalities. There is mild left ventricular hypertrophy.  2. Right ventricular systolic function is normal. The right ventricular size is normal.  3. Left atrial size was severely dilated.  4. The mitral valve is normal in structure. No evidence of mitral valve regurgitation. No evidence of mitral stenosis.  5. The aortic valve is tricuspid. Aortic valve regurgitation is not visualized. No aortic stenosis is present.  6. The inferior vena cava is dilated in size with <50% respiratory variability, suggesting right atrial pressure of 15 mmHg. FINDINGS  Left Ventricle: Left ventricular ejection fraction, by estimation, is 60 to 65%. The left ventricle has normal function. The left ventricle has no regional wall motion abnormalities. The left ventricular internal cavity size was normal in size. There is  mild left ventricular hypertrophy. Right Ventricle: The right ventricular size is normal. No increase in right ventricular wall thickness. Right ventricular systolic function is normal. Left Atrium: Left atrial size was severely dilated. Right Atrium: Right atrial size was normal in size. Pericardium: There is no evidence of pericardial effusion. Mitral Valve: The mitral valve is  normal in structure. No evidence of mitral valve stenosis. Tricuspid Valve: The tricuspid valve is normal in structure. Tricuspid valve regurgitation is not demonstrated. No evidence of tricuspid stenosis. Aortic Valve: The aortic valve is tricuspid. Aortic valve regurgitation is not visualized. No aortic stenosis is present. Pulmonic Valve: The pulmonic valve was normal in structure. Pulmonic valve regurgitation is not visualized. No evidence of pulmonic stenosis.  Aorta: The aortic root is normal in size and structure. Venous: The inferior vena cava is dilated in size with less than 50% respiratory variability, suggesting right atrial pressure of 15 mmHg. IAS/Shunts: No atrial level shunt detected by color flow Doppler. LEFT VENTRICLE PLAX 2D LVIDd:         4.20 cm LVIDs:         2.80 cm LV PW:         1.00 cm LV IVS:        1.10 cm LVOT diam:     1.90 cm LVOT Area:     2.84 cm  LEFT ATRIUM             Index        RIGHT ATRIUM           Index LA Vol (A2C):   77.7 ml 44.16 ml/m  RA Area:     18.20 cm LA Vol (A4C):   65.3 ml 37.11 ml/m  RA Volume:   48.00 ml  27.28 ml/m LA Biplane Vol: 71.7 ml 40.75 ml/m   AORTA Ao Root diam: 2.50 cm Ao Asc diam:  3.60 cm  SHUNTS Systemic Diam: 1.90 cm Chilton Si MD Electronically signed by Chilton Si MD Signature Date/Time: 01/12/2023/3:16:16 PM    Final     Cardiac Studies     Patient Profile     77 y.o. female with a hx of chronic thrombocytopenia, paroxysmal atrial fibrillation, history of TIA, hypothyroidism, hyperlipidemia, CKD stage III, hypertension who is being seen 01/10/2023 for the evaluation of atrial fibrillatio   Assessment & Plan    Atrial fibrillation with RVR: Diagnosed with A-fib 10/2020 and underwent successful DCCV at that time.  Was ultimately taken off of anticoagulation due to chronic thrombocytopenia.  Admitted 10/2022 with TIA.  Her CHA2DS2-VASc is now 6 (hypertension, TIA, age x 2, female).  Presented this admission with A-fib with RVR -Complicated situation, has not been on anticoagulation given her chronic thrombocytopenia, but with recent admission with TIA would ideally anticoagulate.  Discussed with her hematologist Dr. Al Pimple and okay with starting heparin drip to see if she tolerates.  If platelets stable and no bleeding, can plan to transition to Eliquis and will be monitored closely by Dr Al Pimple as outpatient.  Tolerated heparin drip well, no bleeding and platelets stable in 60s.   Switched to Eliquis 9/27.  Spontaneously converted to NSR on 9/27 -Plan referral to Dr. Lalla Brothers as outpatient for consideration of Watchman -Continue metoprolol 50 mg BID  Thrombocytopenia: Follows with Dr Al Pimple.  Platelets have ranged from 39-72 over the last year, currently stable in 60s this admission  Hypertension: Continue metoprolol  TIA: Continue Lipitor.  Stop aspirin with starting anticoagulation as above  [facility] will sign off.   Medication Recommendations: Eliquis 5 mg twice daily, metoprolol 50 mg twice daily.  Lasix 20 mg as needed, recommend monitoring daily weights and can take Lasix if gains more than pounds in 1 day or 5 pounds in 1 week.  Stop ASA  Other recommendations (labs, testing, etc):  None Follow  up as an outpatient: Follow-up scheduled for 10/14.  Will also place referral to see Dr. Lalla Brothers for [facility] evaluation   For questions or updates, please contact [facility] Please consult [email or web address] for contact info under        Signed, [name], MD  01/13/2023, 8:43 AM

## 2023-01-13 NOTE — Discharge Summary (Signed)
[facility] Physician Discharge Summary   Patient name: Jillian Rodgers  Admit date:     01/09/2023  Discharge date: 01/13/2023  Attending Physician: Maryln Gottron [[id number]]  Discharge Physician: Carollee Herter   PCP: [name], MD  Admitted From: Home  Disposition:  Home  Recommendations for Outpatient Follow-up:  Follow up with PCP in 1-2 weeks Follow up with Dr. Lalla Brothers with EP/Cards. Office will call with appointment  Home Health:No Equipment/Devices: None  Discharge Condition:Stable CODE STATUS:FULL Diet recommendation: Heart Healthy Fluid Restriction: None  Hospital Summary: HPI: Jillian Rodgers is a 77 y.o. female with medical history significant for chronic thrombocytopenia, hyperlipidemia, hypothyroidism, CKD stage III, TIA on aspirin, paroxysmal atrial fibrillation not on anticoagulation admitted to the hospital with A-fib with RVR.  She underwent successful DCCV on 10/25/2020 and at the time was on Eliquis, she wore a cardiac monitor later that year and was taken off Eliquis due to no further atrial fibrillation and chronic thrombocytopenia for which she is followed by hematology.  She had no known recurrence of atrial fibrillation, was hospitalized in July for TIA and since then has been on aspirin.  She had unremarkable echocardiography during that hospital stay than grade 1 diastolic dysfunction.  For the last week or so, she has been having burning with urination, and for the last couple of weeks she has been lethargic, feeling dizzy and lightheaded with exertion (husband at bedside states that she has sometimes been dizzy and lightheaded even when lying in bed), has not felt any palpitations, or chest pain.  When she had atrial fibrillation back in 2022 she had palpitations.  This time is different.  She had a follow-up appointment with her hematologist, who noted that the patient was tachycardic, and mentioned her symptoms.  She was sent to the ER for  evaluation.   Significant Events: Admitted 01/09/2023 for rapid afib and UTI 01-09-2023 started on IV cardizem gtts. Had to stop IV cardizem due to low BP 01-11-2023 due to persistent rapid afib, cardiology increased lopressor dose to 50 mg bid 01-12-2023 HR still in the 130s.  Cardizem 30 mg q6h ordered.  pt converted to NSR after 1 dose of PO Cardizem. Had already been on lopressor 50 mg bid  Significant Labs: Admitting urine cx growing GNR Final urine culture proteus mirabilis. Sensitive to rocephin.  Significant Imaging Studies: 01-12-2023 echo shows LVEF 60%. Left atrium severely dilated.  Antibiotic Therapy: Anti-infectives (From admission, onward)    Start     Dose/Rate Route Frequency Ordered Stop   01/10/23 1830  cefTRIAXone (ROCEPHIN) 1 g in sodium chloride 0.9 % 100 mL IVPB  Status:  Discontinued        1 g 200 mL/hr over 30 Minutes Intravenous Every 24 hours 01/09/23 2103 01/12/23 1256   01/09/23 1815  cefTRIAXone (ROCEPHIN) 1 g in sodium chloride 0.9 % 100 mL IVPB        1 g 200 mL/hr over 30 Minutes Intravenous  Once 01/09/23 1814 01/09/23 2058       Procedures:   Consultants: Cardiology   Hospital Course by Problem: * Atrial fibrillation with RVR (HCC) Admitted for rapid afib. Started on IV cardizem. Cardiology consulted. Had echo in 10-2022 for her TIA workup that showed normal LVEF. Cards ordered limited echo.  Discussed with cards/Dr. Bjorn Pippin. Ok to start eliquis and stop IV heparin. Pt has tolerated IV heparin for 3 days now without significant drop in platelet count. Also since HR still >120s, will start  cardizem po 30 mg q6h in attempt for better HR control. This was on 01-12-2023.  Pt converted to NSR on 01-12-2023 after 1 dose of po cardizem.  Pt monitored overnight. Remains in NSR.  Will discharge on po lopressor 50 mg bid, eliquis 5 mg bid.  Pt will f/u with Dr. Lalla Brothers with EP/Cards to consider Watchman device.  Plan on keep K >4 and Mg >2.0. will discharge  on low dose Kcl and Mg supplementation  Acute cystitis without hematuria On IV rocephin. Urine cx growing Proteus.  Sensitive to Rocephin.  Pt completed 3 days of IV rocephin.  should be sufficient. Pt no longer having any dysuria.  Thrombocytopenia (HCC) Chronic. Follows with heme/onc.  IV heparin started after cards discussed with heme/onc. Stable on IV heparin for 3 days. Changied to Eliquis for systemic anticoagulation for CVA prophylaxis. Platelet count stable on Eliquis.  Primary hypertension On po lopressor 50 mg bid  CKD stage 3a, GFR 45-59 ml/min (HCC) - baseline SCr 1.1-1.4 Stable.  Hypothyroidism Stable. On synthroid 50 mg daily.  Hyperlipidemia On lipitor 80 mg daily.  Hypokalemia Repleted with po and IV Kcl. Resolved.  History of TIA (transient ischemic attack) Echo normal 01-12-2023. LVEF 60%    Discharge Diagnoses:  Principal Problem:   Atrial fibrillation with RVR (HCC) Active Problems:   Thrombocytopenia (HCC)   Acute cystitis without hematuria   Hyperlipidemia   Hypothyroidism   CKD stage 3a, GFR 45-59 ml/min (HCC) - baseline SCr 1.1-1.4   Primary hypertension   History of TIA (transient ischemic attack)   Hypokalemia   Discharge Instructions  Discharge Instructions     Ambulatory referral to Cardiac Electrophysiology   Complete by: As directed    For consideration of Watchman per Dr. Bjorn Pippin. Please Dr. Campbell Lerner rounding note from 01/12/2023. Thank you!   Call MD for:   Complete by: As directed    Any palpitations or heart rate elevated greater than 100 beats per minute that is sustained for more than 15 minutes   Call MD for:  difficulty breathing, headache or visual disturbances   Complete by: As directed    Call MD for:  extreme fatigue   Complete by: As directed    Call MD for:  persistant dizziness or light-headedness   Complete by: As directed    Call MD for:  persistant nausea and vomiting   Complete by: As directed    Call MD  for:  redness, tenderness, or signs of infection (pain, swelling, redness, odor or green/yellow discharge around incision site)   Complete by: As directed    Call MD for:  temperature >100.4   Complete by: As directed    Diet - low sodium heart healthy   Complete by: As directed    Discharge instructions   Complete by: As directed    1. Follow up with your primary care provider within 1-2 weeks following hospital discharge. 2. Cardiology office will call you with an appointment about next steps for your atrial fibrillation 3. Stop taking Aspirin.  Avoid medications in the NSAIDs class(examples are ibuprofen, Advil, naproxen, Naprosyn). If you are unsure if you can take a certain pain pill, ask your pharmacist if it is a NSAID. 4. You may take Tylenol 325 mg to 1000 mg at a time for pain   Increase activity slowly   Complete by: As directed       Allergies as of 01/13/2023   No Known Allergies      Medication List  STOP taking these medications    aspirin EC 81 MG tablet   meloxicam 15 MG tablet Commonly known as: MOBIC       TAKE these medications    apixaban 5 MG Tabs tablet Commonly known as: ELIQUIS Take 1 tablet (5 mg total) by mouth 2 (two) times daily.   atorvastatin 80 MG tablet Commonly known as: LIPITOR Take 1 tablet (80 mg total) by mouth daily.   Botox 100 units Solr injection Generic drug: botulinum toxin Type A Inject 100 Units into the muscle See admin instructions. Inject 100 units intramuscularly every 6-8 weeks   cetirizine 10 MG tablet Commonly known as: ZYRTEC Take 10 mg by mouth daily.   divalproex 500 MG 24 hr tablet Commonly known as: DEPAKOTE ER Take 1 tablet (500 mg total) by mouth daily.   furosemide 20 MG tablet Commonly known as: LASIX Take 1 tablet (20 mg total) by mouth daily as needed. What changed: reasons to take this   gabapentin 300 MG capsule Commonly known as: NEURONTIN Take 300 mg (1 tablet) in the morning and 600  mg (2 tablets) at bedtime What changed:  how much to take how to take this when to take this additional instructions   levothyroxine 50 MCG tablet Commonly known as: SYNTHROID Take  1 tablet  Daily  on an empty stomach with only water for 30 minutes & no Antacid meds, Calcium or Magnesium for 4 hours & avoid Biotin                                                                                                                    /                                                                   TAKE                                         BY                                                 MOUTH What changed:  how much to take how to take this when to take this additional instructions   Magnesium Oxide 400 MG Caps Take 1 capsule (400 mg total) by mouth at bedtime.   metoprolol tartrate 50 MG tablet Commonly known as: LOPRESSOR Take 1 tablet (50 mg total) by mouth 2 (two) times daily.   pantoprazole 40 MG tablet Commonly known as: PROTONIX Take  1 tablet  2 x /day  for Acid Indigestion & Heartburn What changed:  how much to take how to take this when to take this additional instructions   potassium chloride SA 20 MEQ tablet Commonly known as: KLOR-CON M Take 1 tablet (20 mEq total) by mouth daily.   PROBIOTIC PO Take 1 capsule by mouth daily.   Systane Balance 0.6 % Soln Generic drug: Propylene Glycol Place 1 drop into both eyes at bedtime.   Systane Ultra 0.4-0.3 % Soln Generic drug: Polyethyl Glycol-Propyl Glycol Place 1 drop into both eyes in the morning.   TYLENOL 500 MG tablet Generic drug: acetaminophen Take 500-1,000 mg by mouth every 6 (six) hours as needed for mild pain or headache.   Vitamin C 500 MG Caps Take 500 mg by mouth daily.   Vitamin D3 125 MCG (5000 UT) Tabs Take 5,000 Units by mouth daily.   Zinc 30 MG Tabs Take 30 mg by mouth daily.        Follow-up Information     Sharlene Dory, PA-C Follow up.   Specialty: Cardiology Why:  Follow-up with Cardiology scheduled for 01/29/2023 at 2:45pm. Please arrive 15 minutes early for check-in. If this date/ time does not work for you, please call our office to reschedule. Contact information: [address] [place] [address] [phone]                No Known Allergies  Discharge Exam: Vitals:   01/13/23 0556 01/13/23 0630  BP:  (!) 148/99  Pulse: 63   Resp: 17   Temp: 98.2 F (36.8 C)   SpO2: 100%     Physical Exam Vitals and nursing note reviewed.  Constitutional:      General: She is not in acute distress.    Appearance: She is normal weight. She is not ill-appearing, toxic-appearing or diaphoretic.  HENT:     Head: Normocephalic and atraumatic.  Eyes:     General: No scleral icterus. Cardiovascular:     Rate and Rhythm: Normal rate and regular rhythm.     Comments: Telemetry reviewed personally. Pt remains in NSR. Pulmonary:     Effort: Pulmonary effort is normal.     Breath sounds: Normal breath sounds.  Abdominal:     General: Abdomen is flat. Bowel sounds are normal. There is no distension.     Palpations: Abdomen is soft.     Tenderness: There is no abdominal tenderness.  Musculoskeletal:     Right lower leg: No edema.     Left lower leg: No edema.  Skin:    General: Skin is warm and dry.     Capillary Refill: Capillary refill takes less than 2 seconds.  Neurological:     General: No focal deficit present.     Mental Status: She is alert and oriented to person, place, and time.     The results of significant diagnostics from this hospitalization (including imaging, microbiology, ancillary and laboratory) are listed below for reference.    Microbiology: Recent Results (from the past 240 hour(s))  Resp panel by RT-PCR (RSV, Flu A&B, Covid) Anterior Nasal Swab     Status: None   Collection Time: 01/09/23  4:20 PM   Specimen: Anterior Nasal Swab  Result Value Ref Range Status   SARS Coronavirus 2 by RT PCR NEGATIVE  NEGATIVE Final    Comment: (NOTE) SARS-CoV-2 target nucleic acids are NOT DETECTED.  The SARS-CoV-2 RNA is generally detectable in upper respiratory specimens during the acute phase of  infection. The lowest concentration of SARS-CoV-2 viral copies this assay can detect is 138 copies/mL. A negative result does not preclude SARS-Cov-2 infection and should not be used as the sole basis for treatment or other patient management decisions. A negative result may occur with  improper specimen collection/handling, submission of specimen other than nasopharyngeal swab, presence of viral mutation(s) within the areas targeted by this assay, and inadequate number of viral copies(<138 copies/mL). A negative result must be combined with clinical observations, patient history, and epidemiological information. The expected result is Negative.  Fact Sheet for Patients:  BloggerCourse.com  Fact Sheet for Healthcare Providers:  SeriousBroker.it  This test is no t yet approved or cleared by the [place] FDA and  has been authorized for detection and/or diagnosis of SARS-CoV-2 by FDA under an Emergency Use Authorization (EUA). This EUA will remain  in effect (meaning this test can be used) for the duration of the COVID-19 declaration under Section 564(b)(1) of the Act, 21 U.S.C.section 360bbb-3(b)(1), unless the authorization is terminated  or revoked sooner.       Influenza A by PCR NEGATIVE NEGATIVE Final   Influenza B by PCR NEGATIVE NEGATIVE Final    Comment: (NOTE) The Xpert Xpress SARS-CoV-2/FLU/RSV plus assay is intended as an aid in the diagnosis of influenza from Nasopharyngeal swab specimens and should not be used as a sole basis for treatment. Nasal washings and aspirates are unacceptable for Xpert Xpress SARS-CoV-2/FLU/RSV testing.  Fact Sheet for Patients: BloggerCourse.com  Fact Sheet for Healthcare  Providers: SeriousBroker.it  This test is not yet approved or cleared by the [place] FDA and has been authorized for detection and/or diagnosis of SARS-CoV-2 by FDA under an Emergency Use Authorization (EUA). This EUA will remain in effect (meaning this test can be used) for the duration of the COVID-19 declaration under Section 564(b)(1) of the Act, 21 U.S.C. section 360bbb-3(b)(1), unless the authorization is terminated or revoked.     Resp Syncytial Virus by PCR NEGATIVE NEGATIVE Final    Comment: (NOTE) Fact Sheet for Patients: BloggerCourse.com  Fact Sheet for Healthcare Providers: SeriousBroker.it  This test is not yet approved or cleared by the [place] FDA and has been authorized for detection and/or diagnosis of SARS-CoV-2 by FDA under an Emergency Use Authorization (EUA). This EUA will remain in effect (meaning this test can be used) for the duration of the COVID-19 declaration under Section 564(b)(1) of the Act, 21 U.S.C. section 360bbb-3(b)(1), unless the authorization is terminated or revoked.  Performed at [facility], [address], [place], [place] [address]   Urine Culture     Status: Abnormal   Collection Time: 01/09/23  4:20 PM   Specimen: Urine, Clean Catch  Result Value Ref Range Status   Specimen Description   Final    URINE, CLEAN CATCH Performed at [facility], [address], [place], [place] [address]    Special Requests   Final    NONE Performed at [facility], [address], [place], [place] [address]    Culture 40,000 COLONIES/mL PROTEUS MIRABILIS (A)  Final   Report Status 01/11/2023 FINAL  Final   Organism ID, Bacteria PROTEUS MIRABILIS (A)  Final      Susceptibility   Proteus mirabilis - MIC*    AMPICILLIN <=2 SENSITIVE Sensitive     CEFAZOLIN <=4 SENSITIVE Sensitive     CEFEPIME <=0.12  SENSITIVE Sensitive     CEFTRIAXONE <=0.25 SENSITIVE Sensitive  CIPROFLOXACIN <=0.25 SENSITIVE Sensitive     GENTAMICIN <=1 SENSITIVE Sensitive     IMIPENEM 2 SENSITIVE Sensitive     NITROFURANTOIN RESISTANT Resistant     TRIMETH/SULFA <=20 SENSITIVE Sensitive     AMPICILLIN/SULBACTAM <=2 SENSITIVE Sensitive     PIP/TAZO <=4 SENSITIVE Sensitive     * 40,000 COLONIES/mL PROTEUS MIRABILIS  MRSA Next Gen by PCR, Nasal     Status: None   Collection Time: 01/10/23  3:58 PM   Specimen: Nasal Mucosa; Nasal Swab  Result Value Ref Range Status   MRSA by PCR Next Gen NOT DETECTED NOT DETECTED Final    Comment: (NOTE) The GeneXpert MRSA Assay (FDA approved for NASAL specimens only), is one component of a comprehensive MRSA colonization surveillance program. It is not intended to diagnose MRSA infection nor to guide or monitor treatment for MRSA infections. Test performance is not FDA approved in patients less than 15 years old. Performed at [facility], [address] [place], [place] [address]      Labs: BNP (last 3 results) Recent Labs    01/09/23 1535  BNP 601.9*   Basic Metabolic Panel: Recent Labs  Lab 01/09/23 1535 01/10/23 0527 01/11/23 0040 01/12/23 0258 01/13/23 0537  NA 140 139 139 138 138  K 3.6 3.5 3.5 4.9 4.3  CL 106 106 105 106 106  CO2 25 21* 25 23 24   GLUCOSE 113* 98 111* 104* 98  BUN 36* 28* 25* 23 25*  CREATININE 1.40* 1.04* 0.88 1.09* 1.04*  CALCIUM 9.6 9.2 9.0 9.1 9.2  MG  --   --  1.9 2.4  --    Liver Function Tests: Recent Labs  Lab 01/09/23 1535  AST 24  ALT 21  ALKPHOS 57  BILITOT 0.4  PROT 6.9  ALBUMIN 3.2*   CBC: Recent Labs  Lab 01/09/23 1416 01/09/23 1535 01/10/23 0527 01/11/23 0040 01/12/23 0258 01/13/23 0537  WBC 8.8 8.9 8.8 8.7 10.1 9.0  NEUTROABS 6.2 6.5  --   --   --   --   HGB 12.8 13.0 11.8* 10.2* 11.0* 10.2*  HCT 39.5 40.4 37.3 31.5* 35.9* 33.0*  MCV 94.5 96.0 95.6 94.3 100.8* 100.0  PLT 62*  62* 68* 64* 68* 69*   Urinalysis    Component Value Date/Time   COLORURINE YELLOW 01/09/2023 1620   APPEARANCEUR CLOUDY (A) 01/09/2023 1620   LABSPEC 1.018 01/09/2023 1620   PHURINE 5.0 01/09/2023 1620   GLUCOSEU NEGATIVE 01/09/2023 1620   HGBUR NEGATIVE 01/09/2023 1620   BILIRUBINUR NEGATIVE 01/09/2023 1620   KETONESUR NEGATIVE 01/09/2023 1620   PROTEINUR 100 (A) 01/09/2023 1620   NITRITE NEGATIVE 01/09/2023 1620   LEUKOCYTESUR LARGE (A) 01/09/2023 1620   Sepsis Labs Recent Labs  Lab 01/10/23 0527 01/11/23 0040 01/12/23 0258 01/13/23 0537  WBC 8.8 8.7 10.1 9.0   Microbiology Recent Results (from the past 240 hour(s))  Resp panel by RT-PCR (RSV, Flu A&B, Covid) Anterior Nasal Swab     Status: None   Collection Time: 01/09/23  4:20 PM   Specimen: Anterior Nasal Swab  Result Value Ref Range Status   SARS Coronavirus 2 by RT PCR NEGATIVE NEGATIVE Final    Comment: (NOTE) SARS-CoV-2 target nucleic acids are NOT DETECTED.  The SARS-CoV-2 RNA is generally detectable in upper respiratory specimens during the acute phase of infection. The lowest concentration of SARS-CoV-2 viral copies this assay can detect is 138 copies/mL. A negative result does not preclude SARS-Cov-2 infection and should not  be used as the sole basis for treatment or other patient management decisions. A negative result may occur with  improper specimen collection/handling, submission of specimen other than nasopharyngeal swab, presence of viral mutation(s) within the areas targeted by this assay, and inadequate number of viral copies(<138 copies/mL). A negative result must be combined with clinical observations, patient history, and epidemiological information. The expected result is Negative.  Fact Sheet for Patients:  BloggerCourse.com  Fact Sheet for Healthcare Providers:  SeriousBroker.it  This test is no t yet approved or cleared by the [place] FDA and  has been authorized for detection and/or diagnosis of SARS-CoV-2 by FDA under an Emergency Use Authorization (EUA). This EUA will remain  in effect (meaning this test can be used) for the duration of the COVID-19 declaration under Section 564(b)(1) of the Act, 21 U.S.C.section 360bbb-3(b)(1), unless the authorization is terminated  or revoked sooner.       Influenza A by PCR NEGATIVE NEGATIVE Final   Influenza B by PCR NEGATIVE NEGATIVE Final    Comment: (NOTE) The Xpert Xpress SARS-CoV-2/FLU/RSV plus assay is intended as an aid in the diagnosis of influenza from Nasopharyngeal swab specimens and should not be used as a sole basis for treatment. Nasal washings and aspirates are unacceptable for Xpert Xpress SARS-CoV-2/FLU/RSV testing.  Fact Sheet for Patients: BloggerCourse.com  Fact Sheet for Healthcare Providers: SeriousBroker.it  This test is not yet approved or cleared by the [place] FDA and has been authorized for detection and/or diagnosis of SARS-CoV-2 by FDA under an Emergency Use Authorization (EUA). This EUA will remain in effect (meaning this test can be used) for the duration of the COVID-19 declaration under Section 564(b)(1) of the Act, 21 U.S.C. section 360bbb-3(b)(1), unless the authorization is terminated or revoked.     Resp Syncytial Virus by PCR NEGATIVE NEGATIVE Final    Comment: (NOTE) Fact Sheet for Patients: BloggerCourse.com  Fact Sheet for Healthcare Providers: SeriousBroker.it  This test is not yet approved or cleared by the [place] FDA and has been authorized for detection and/or diagnosis of SARS-CoV-2 by FDA under an Emergency Use Authorization (EUA). This EUA will remain in effect (meaning this test can be used) for the duration of the COVID-19 declaration under Section 564(b)(1) of the Act, 21 U.S.C. section  360bbb-3(b)(1), unless the authorization is terminated or revoked.  Performed at [facility], [address], [place], [place] [address]   Urine Culture     Status: Abnormal   Collection Time: 01/09/23  4:20 PM   Specimen: Urine, Clean Catch  Result Value Ref Range Status   Specimen Description   Final    URINE, CLEAN CATCH Performed at [facility], [address], [place], [place] [address]    Special Requests   Final    NONE Performed at [facility], [address], [place], [place] [address]    Culture 40,000 COLONIES/mL PROTEUS MIRABILIS (A)  Final   Report Status 01/11/2023 FINAL  Final   Organism ID, Bacteria PROTEUS MIRABILIS (A)  Final      Susceptibility   Proteus mirabilis - MIC*    AMPICILLIN <=2 SENSITIVE Sensitive     CEFAZOLIN <=4 SENSITIVE Sensitive     CEFEPIME <=0.12 SENSITIVE Sensitive     CEFTRIAXONE <=0.25 SENSITIVE Sensitive     CIPROFLOXACIN <=0.25 SENSITIVE Sensitive     GENTAMICIN <=1 SENSITIVE Sensitive     IMIPENEM 2 SENSITIVE Sensitive     NITROFURANTOIN RESISTANT  Resistant     TRIMETH/SULFA <=20 SENSITIVE Sensitive     AMPICILLIN/SULBACTAM <=2 SENSITIVE Sensitive     PIP/TAZO <=4 SENSITIVE Sensitive     * 40,000 COLONIES/mL PROTEUS MIRABILIS  MRSA Next Gen by PCR, Nasal     Status: None   Collection Time: 01/10/23  3:58 PM   Specimen: Nasal Mucosa; Nasal Swab  Result Value Ref Range Status   MRSA by PCR Next Gen NOT DETECTED NOT DETECTED Final    Comment: (NOTE) The GeneXpert MRSA Assay (FDA approved for NASAL specimens only), is one component of a comprehensive MRSA colonization surveillance program. It is not intended to diagnose MRSA infection nor to guide or monitor treatment for MRSA infections. Test performance is not FDA approved in patients less than 71 years old. Performed at [facility], [address] [place], [place] [address]      Procedures/Studies: ECHOCARDIOGRAM LIMITED  Result Date: 01/12/2023    ECHOCARDIOGRAM LIMITED REPORT   Patient Name:   MARIYA AGA Date of Exam: 01/12/2023 Medical Rec #:  [id number]         Height:       64.0 in Accession #:    [id number]        Weight:       155.9 lb Date of Birth:  1946/01/05         BSA:          1.760 m Patient Age:    76 years          BP:           117/87 mmHg Patient Gender: F                 HR:           72 bpm. Exam Location:  Inpatient Procedure: Limited Echo Indications:    Atrial Fibrillation I48.91  History:        Patient has prior history of Echocardiogram examinations, most                 recent 10/18/2022. Risk Factors:Hypertension.  Sonographer:    Darlys Gales Referring Phys: [id number] CHRISTOPHER L SCHUMANN IMPRESSIONS  1. Left ventricular ejection fraction, by estimation, is 60 to 65%. The left ventricle has normal function. The left ventricle has no regional wall motion abnormalities. There is mild left ventricular hypertrophy.  2. Right ventricular systolic function is normal. The right ventricular size is normal.  3. Left atrial size was severely dilated.  4. The mitral valve is normal in structure. No evidence of mitral valve regurgitation. No evidence of mitral stenosis.  5. The aortic valve is tricuspid. Aortic valve regurgitation is not visualized. No aortic stenosis is present.  6. The inferior vena cava is dilated in size with <50% respiratory variability, suggesting right atrial pressure of 15 mmHg. FINDINGS  Left Ventricle: Left ventricular ejection fraction, by estimation, is 60 to 65%. The left ventricle has normal function. The left ventricle has no regional wall motion abnormalities. The left ventricular internal cavity size was normal in size. There is  mild left ventricular hypertrophy. Right Ventricle: The right ventricular size is normal. No increase in right ventricular wall thickness. Right ventricular systolic function is normal. Left Atrium:  Left atrial size was severely dilated. Right Atrium: Right atrial size was normal in size. Pericardium: There is no evidence of pericardial effusion. Mitral Valve: The mitral valve is normal in structure. No evidence of mitral valve stenosis. Tricuspid Valve: The tricuspid  valve is normal in structure. Tricuspid valve regurgitation is not demonstrated. No evidence of tricuspid stenosis. Aortic Valve: The aortic valve is tricuspid. Aortic valve regurgitation is not visualized. No aortic stenosis is present. Pulmonic Valve: The pulmonic valve was normal in structure. Pulmonic valve regurgitation is not visualized. No evidence of pulmonic stenosis. Aorta: The aortic root is normal in size and structure. Venous: The inferior vena cava is dilated in size with less than 50% respiratory variability, suggesting right atrial pressure of 15 mmHg. IAS/Shunts: No atrial level shunt detected by color flow Doppler. LEFT VENTRICLE PLAX 2D LVIDd:         4.20 cm LVIDs:         2.80 cm LV PW:         1.00 cm LV IVS:        1.10 cm LVOT diam:     1.90 cm LVOT Area:     2.84 cm  LEFT ATRIUM             Index        RIGHT ATRIUM           Index LA Vol (A2C):   77.7 ml 44.16 ml/m  RA Area:     18.20 cm LA Vol (A4C):   65.3 ml 37.11 ml/m  RA Volume:   48.00 ml  27.28 ml/m LA Biplane Vol: 71.7 ml 40.75 ml/m   AORTA Ao Root diam: 2.50 cm Ao Asc diam:  3.60 cm  SHUNTS Systemic Diam: 1.90 cm Chilton Si MD Electronically signed by Chilton Si MD Signature Date/Time: 01/12/2023/3:16:16 PM    Final    DG Chest Port 1 View  Result Date: 01/09/2023 CLINICAL DATA:  Tachycardia and hypotension EXAM: PORTABLE CHEST 1 VIEW COMPARISON:  Chest radiograph dated 10/17/2022 FINDINGS: Normal lung volumes. Rounded retrocardiac opacity. No pleural effusion or pneumothorax. The heart size and mediastinal contours are within normal limits. No acute osseous abnormality. Right upper quadrant surgical clips. IMPRESSION: 1. Rounded retrocardiac  opacity, likely hiatal hernia. 2.  No focal consolidations. Electronically Signed   By: Agustin Cree M.D.   On: 01/09/2023 17:46    Time coordinating discharge: 40 mins  SIGNED:  Carollee Herter, DO [facility] 01/13/23, 9:30 AM

## 2023-01-15 ENCOUNTER — Ambulatory Visit: Payer: Medicare Other | Admitting: Neurology

## 2023-01-18 ENCOUNTER — Telehealth: Payer: Self-pay

## 2023-01-18 ENCOUNTER — Encounter: Payer: Self-pay | Admitting: Nurse Practitioner

## 2023-01-18 ENCOUNTER — Ambulatory Visit: Payer: Medicare PPO | Admitting: Nurse Practitioner

## 2023-01-18 VITALS — BP 110/76 | HR 66 | Temp 97.6°F | Ht 62.5 in | Wt 153.0 lb

## 2023-01-18 DIAGNOSIS — N3000 Acute cystitis without hematuria: Secondary | ICD-10-CM

## 2023-01-18 DIAGNOSIS — Z09 Encounter for follow-up examination after completed treatment for conditions other than malignant neoplasm: Secondary | ICD-10-CM

## 2023-01-18 DIAGNOSIS — D696 Thrombocytopenia, unspecified: Secondary | ICD-10-CM

## 2023-01-18 DIAGNOSIS — I4891 Unspecified atrial fibrillation: Secondary | ICD-10-CM | POA: Diagnosis not present

## 2023-01-18 DIAGNOSIS — N1832 Chronic kidney disease, stage 3b: Secondary | ICD-10-CM | POA: Diagnosis not present

## 2023-01-18 DIAGNOSIS — Z79899 Other long term (current) drug therapy: Secondary | ICD-10-CM | POA: Diagnosis not present

## 2023-01-18 DIAGNOSIS — R0989 Other specified symptoms and signs involving the circulatory and respiratory systems: Secondary | ICD-10-CM | POA: Diagnosis not present

## 2023-01-18 NOTE — Patient Instructions (Signed)
Urinary Tract Infection, Adult A urinary tract infection (UTI) is an infection of any part of the urinary tract. The urinary tract includes: The kidneys. The ureters. The bladder. The urethra. These organs make, store, and get rid of pee (urine) in the body. What are the causes? This infection is caused by germs (bacteria) in your genital area. These germs grow and cause swelling (inflammation) of your urinary tract. What increases the risk? The following factors may make you more likely to develop this condition: Using a small, thin tube (catheter) to drain pee. Not being able to control when you pee or poop (incontinence). Being female. If you are female, these things can increase the risk: Using these methods to prevent pregnancy: A medicine that kills sperm (spermicide). A device that blocks sperm (diaphragm). Having low levels of a female hormone (estrogen). Being pregnant. You are more likely to develop this condition if: You have genes that add to your risk. You are sexually active. You take antibiotic medicines. You have trouble peeing because of: A prostate that is bigger than normal, if you are female. A blockage in the part of your body that drains pee from the bladder. A kidney stone. A nerve condition that affects your bladder. Not getting enough to drink. Not peeing often enough. You have other conditions, such as: Diabetes. A weak disease-fighting system (immune system). Sickle cell disease. Gout. Injury of the spine. What are the signs or symptoms? Symptoms of this condition include: Needing to pee right away. Peeing small amounts often. Pain or burning when peeing. Blood in the pee. Pee that smells bad or not like normal. Trouble peeing. Pee that is cloudy. Fluid coming from the vagina, if you are female. Pain in the belly or lower back. Other symptoms include: Vomiting. Not feeling hungry. Feeling mixed up (confused). This may be the first symptom in  older adults. Being tired and grouchy (irritable). A fever. Watery poop (diarrhea). How is this treated? Taking antibiotic medicine. Taking other medicines. Drinking enough water. In some cases, you may need to see a specialist. Follow these instructions at home:  Medicines Take over-the-counter and prescription medicines only as told by your doctor. If you were prescribed an antibiotic medicine, take it as told by your doctor. Do not stop taking it even if you start to feel better. General instructions Make sure you: Pee until your bladder is empty. Do not hold pee for a long time. Empty your bladder after sex. Wipe from front to back after peeing or pooping if you are a female. Use each tissue one time when you wipe. Drink enough fluid to keep your pee pale yellow. Keep all follow-up visits. Contact a doctor if: You do not get better after 1-2 days. Your symptoms go away and then come back. Get help right away if: You have very bad back pain. You have very bad pain in your lower belly. You have a fever. You have chills. You feeling like you will vomit or you vomit. Summary A urinary tract infection (UTI) is an infection of any part of the urinary tract. This condition is caused by germs in your genital area. There are many risk factors for a UTI. Treatment includes antibiotic medicines. Drink enough fluid to keep your pee pale yellow. This information is not intended to replace advice given to you by your health care provider. Make sure you discuss any questions you have with your health care provider. Document Revised: 11/09/2019 Document Reviewed: 11/14/2019 Elsevier Patient Education    2024 [employer]  

## 2023-01-18 NOTE — Telephone Encounter (Signed)
Per Dr. Bjorn Pippin, called to schedule Watchman consult. While she was offered earlier appointments,  she agreed to schedule with Dr. Lalla Brothers 04/17/2023. She was grateful for call and agreed with plan.

## 2023-01-18 NOTE — Progress Notes (Signed)
Hospital follow up  Assessment and Plan: Hospital visit follow up for:   Hospital discharge follow-up Reviewed discharge instructions in full including medication changes, diagnostics, labs, and future follow ups appointment. All questions and concerns addressed.   - CBC with Differential/Platelet - COMPLETE METABOLIC PANEL WITH GFR - Urinalysis, Routine w reflex microscopic - Urine Culture  Atrial fibrillation with RVR (HCC) Continue Eliquis Has f/u with Cardiology 01/29/23 Continue referral to Dr. Lalla Brothers EP for evaluation of Watchmaker device Continue to monitor platelet level  Thrombocytopenia (HCC)  - CBC with Differential/Platelet  Labile hypertension Well controlled Continue to monitor  - CBC with Differential/Platelet - COMPLETE METABOLIC PANEL WITH GFR  Stage 3b chronic kidney disease (HCC) Improving post hospital discharge. Stay well hydrated. Avoid high salt foods. Avoid NSAIDS. Keep BP well controlled Take medications as prescribed.  - COMPLETE METABOLIC PANEL WITH GFR  Acute cystitis without hematuria Will complete test of cure.  - Urinalysis, Routine w reflex microscopic - Urine Culture  Medication management All medications discussed and reviewed in full. All questions and concerns regarding medications addressed.    - CBC with Differential/Platelet - COMPLETE METABOLIC PANEL WITH GFR - Urinalysis, Routine w reflex microscopic - Urine Culture  Orders Placed This Encounter  Procedures   Urine Culture   CBC with Differential/Platelet   COMPLETE METABOLIC PANEL WITH GFR   Urinalysis, Routine w reflex microscopic   Ambulatory referral to Cardiac Electrophysiology    Referral Priority:   Routine    Referral Type:   Consultation    Referral Reason:   Specialty Services Required    Referred to Provider:   Lanier Prude, MD    Requested Specialty:   Cardiology    Number of Visits Requested:   1   All medications were reviewed with  patient and family and fully reconciled. All questions answered fully, and patient and family members were encouraged to call the office with any further questions or concerns. Discussed goal to avoid readmission related to this diagnosis.   Over 40 minutes of exam, counseling, chart review, and complex, high/moderate level critical decision making was performed this visit.   Future Appointments  Date Time Provider Department Center  01/29/2023  2:45 PM Sharlene Dory, [place] [facility]  03/26/2023 11:00 AM Micki Riley, MD [facility] None  04/05/2023  2:30 PM [name], MD [facility] None  05/02/2023  2:00 PM Christell Constant, MD [facility]  05/08/2023  4:00 PM [facility] DX DEXA 1 [facility]  07/04/2023  2:30 PM Adela Glimpse, NP [facility] None  10/09/2023  2:00 PM [name], MD [facility] None     HPI 77 y.o.female presents alongside daughter, Jillian Rodgers today for follow up for transition from recent hospitalization or SNIF stay. Admit date to the hospital was 01/09/23, patient was discharged from the hospital on 01/13/23 and our clinical staff contacted the office the day after discharge to set up a follow up appointment. The discharge summary, medications, and diagnostic test results were reviewed before meeting with the patient. The patient was admitted for atrial fib:   Patient had general follow up with Dr. Al Pimple, 01/09/23 when she was sent to the ED after she presented with hypotension and tachycardia. Patient stated she had not felt well for last 2 weeks prior. She had felt generalized fatigue, dizziness, presyncope. She has had some mild headaches similar to prior. She had decreased appetite, non bloody diarrhea  Per note it appears they suspected her thrombocytopenia is due to  autoimmune process or early bone marrow disorder. She noted some recent bruising but no bleeding. No blood in her stools. She was not vomiting. No abdominal pain.  Occasional chest pressure no shortness of breath. She has had poor appetite. No change in urination. Stated she had history of A-fib about 2 years ago that required cardioversion. She is not on anticoagulation due to her thrombocytopenia but was on aspirin.   She was admitted for further work up after atrial fib with RVR confirmed.  Nonbloody diarrhea leading to some dehydration.  Good EF on he echo - she was given IV fluids.  Given Metoprolol Presyncopal when standing symptomatic from a fib.  Urine concerning for UTI mildly elevated BNP.  Started on an IV cardizem gtt but BP became too low, BP stayed in the 130s Converted to NS with PO Cardizem.  Urine positive.  Was given heparin, platelet level remained okay, NSR 01/12/23 and continues to be NSR today.  She is considering a watchman device and has a referral pending with Dr. Lalla Brothers.  She is scheduled to f/u with Cardiology 01/29/23.  Continuing on Eliquis.    Overall she reports feeling well and back to her baseline.    Home health is not involved.   Images while in the hospital: [facility] Chest Port 1 View  Result Date: 01/09/2023 CLINICAL DATA:  Tachycardia and hypotension EXAM: PORTABLE CHEST 1 VIEW COMPARISON:  Chest radiograph dated 10/17/2022 FINDINGS: Normal lung volumes. Rounded retrocardiac opacity. No pleural effusion or pneumothorax. The heart size and mediastinal contours are within normal limits. No acute osseous abnormality. Right upper quadrant surgical clips. IMPRESSION: 1. Rounded retrocardiac opacity, likely hiatal hernia. 2.  No focal consolidations. Electronically Signed   By: Agustin Cree M.D.   On: 01/09/2023 17:46     Current Outpatient Medications (Endocrine & Metabolic):    levothyroxine (SYNTHROID) 50 MCG tablet, Take  1 tablet  Daily  on an empty stomach with only water for 30 minutes & no Antacid meds, Calcium or Magnesium for 4 hours & avoid Biotin                                                                                                                     /                                                                   TAKE                                         BY  MOUTH (Patient taking differently: Take 50 mcg by mouth See admin instructions. Take 50 mcg by mouth in the morning on an empty stomach, with only water. Take no antacids, calcium, or magnesium for 4 hours and avoid biotin.)  Current Outpatient Medications (Cardiovascular):    atorvastatin (LIPITOR) 80 MG tablet, Take 1 tablet (80 mg total) by mouth daily.   furosemide (LASIX) 20 MG tablet, Take 1 tablet (20 mg total) by mouth daily as needed. (Patient taking differently: Take 20 mg by mouth daily as needed for fluid or edema.)   metoprolol tartrate (LOPRESSOR) 50 MG tablet, Take 1 tablet (50 mg total) by mouth 2 (two) times daily.  Current Outpatient Medications (Respiratory):    cetirizine (ZYRTEC) 10 MG tablet, Take 10 mg by mouth daily.  Current Outpatient Medications (Analgesics):    TYLENOL 500 MG tablet, Take 500-1,000 mg by mouth every 6 (six) hours as needed for mild pain or headache.  Current Outpatient Medications (Hematological):    apixaban (ELIQUIS) 5 MG TABS tablet, Take 1 tablet (5 mg total) by mouth 2 (two) times daily.  Current Outpatient Medications (Other):    Ascorbic Acid (VITAMIN C) 500 MG CAPS, Take 500 mg by mouth daily.    BOTOX 100 units SOLR injection, Inject 100 Units into the muscle See admin instructions. Inject 100 units intramuscularly every 6-8 weeks   Cholecalciferol (VITAMIN D3) 125 MCG (5000 UT) TABS, Take 5,000 Units by mouth daily.   divalproex (DEPAKOTE ER) 500 MG 24 hr tablet, Take 1 tablet (500 mg total) by mouth daily.   gabapentin (NEURONTIN) 300 MG capsule, Take 300 mg (1 tablet) in the morning and 600 mg (2 tablets) at bedtime (Patient taking differently: Take 300-600 mg by mouth See admin instructions. Take 300 mg by mouth in the morning and 600 mg at  bedtime)   Magnesium Oxide 400 MG CAPS, Take 1 capsule (400 mg total) by mouth at bedtime.   pantoprazole (PROTONIX) 40 MG tablet, Take  1 tablet  2 x /day  for Acid Indigestion & Heartburn (Patient taking differently: Take 40 mg by mouth in the morning and at bedtime.)   potassium chloride SA (KLOR-CON M) 20 MEQ tablet, Take 1 tablet (20 mEq total) by mouth daily.   Probiotic Product (PROBIOTIC PO), Take 1 capsule by mouth daily.   SYSTANE BALANCE 0.6 % SOLN, Place 1 drop into both eyes at bedtime.   SYSTANE ULTRA 0.4-0.3 % SOLN, Place 1 drop into both eyes in the morning.   Zinc 30 MG TABS, Take 30 mg by mouth daily.  Past Medical History:  Diagnosis Date   Anemia with low platelet count (HCC)    Chronic kidney disease    Degenerative joint disease    Dysrhythmia    a fib   GERD (gastroesophageal reflux disease)    History of kidney stones    Hyperlipidemia    Hypothyroidism    Inguinal hernia 02/19/2019   Labile hypertension    Migraines    neurontin helps   Pneumonia    hx of walking pneumonia    Primary hypertension 04/28/2022     No Known Allergies  ROS: all negative except above.   Physical Exam: Filed Weights   01/18/23 1134  Weight: 153 lb (69.4 kg)   BP 110/76   Pulse 66   Temp 97.6 F (36.4 C)   Ht 5' 2.5" (1.588 m)   Wt 153 lb (69.4 kg)   SpO2 95%   BMI 27.54 kg/m  General Appearance: Well nourished, in no apparent distress. Eyes: PERRLA, EOMs, conjunctiva no swelling or erythema Sinuses: No Frontal/maxillary tenderness ENT/Mouth: Ext aud canals clear, TMs without erythema, bulging. No erythema, swelling, or exudate on post pharynx.  Tonsils not swollen or erythematous. Hearing normal.  Neck: Supple, thyroid normal.  Respiratory: Respiratory effort normal, BS equal bilaterally without rales, rhonchi, wheezing or stridor.  Cardio: RRR with no MRGs. Brisk peripheral pulses without edema.  Abdomen: Soft, + BS.  Non tender, no guarding, rebound, hernias,  masses. Lymphatics: Non tender without lymphadenopathy.  Musculoskeletal: Full ROM, 5/5 strength, normal gait.  Skin: Warm, dry without rashes, lesions, ecchymosis.  Neuro: Cranial nerves intact. Normal muscle tone, no cerebellar symptoms. Sensation intact.  Psych: Awake and oriented X 3, normal affect, Insight and Judgment appropriate.     Adela Glimpse, NP 11:49 AM [facility]

## 2023-01-19 ENCOUNTER — Telehealth: Payer: Self-pay | Admitting: Internal Medicine

## 2023-01-19 LAB — URINALYSIS, ROUTINE W REFLEX MICROSCOPIC
Bacteria, UA: NONE SEEN /[HPF]
Bilirubin Urine: NEGATIVE
Glucose, UA: NEGATIVE
Hgb urine dipstick: NEGATIVE
Hyaline Cast: NONE SEEN /[LPF]
Ketones, ur: NEGATIVE
Nitrite: NEGATIVE
Specific Gravity, Urine: 1.016 (ref 1.001–1.035)
pH: 7.5 (ref 5.0–8.0)

## 2023-01-19 LAB — URINE CULTURE
MICRO NUMBER:: 15550164
SPECIMEN QUALITY:: ADEQUATE

## 2023-01-19 LAB — CBC WITH DIFFERENTIAL/PLATELET
Absolute Monocytes: 1090 {cells}/uL — ABNORMAL HIGH (ref 200–950)
Basophils Absolute: 60 {cells}/uL (ref 0–200)
Basophils Relative: 0.6 %
Eosinophils Absolute: 130 {cells}/uL (ref 15–500)
Eosinophils Relative: 1.3 %
HCT: 38.2 % (ref 35.0–45.0)
Hemoglobin: 12.2 g/dL (ref 11.7–15.5)
Lymphs Abs: 1970 {cells}/uL (ref 850–3900)
MCH: 30.3 pg (ref 27.0–33.0)
MCHC: 31.9 g/dL — ABNORMAL LOW (ref 32.0–36.0)
MCV: 95 fL (ref 80.0–100.0)
MPV: 12 fL (ref 7.5–12.5)
Monocytes Relative: 10.9 %
Neutro Abs: 6750 {cells}/uL (ref 1500–7800)
Neutrophils Relative %: 67.5 %
Platelets: 68 10*3/uL — ABNORMAL LOW (ref 140–400)
RBC: 4.02 10*6/uL (ref 3.80–5.10)
RDW: 13.1 % (ref 11.0–15.0)
Total Lymphocyte: 19.7 %
WBC: 10 10*3/uL (ref 3.8–10.8)

## 2023-01-19 LAB — COMPLETE METABOLIC PANEL WITH GFR
AG Ratio: 1.3 (calc) (ref 1.0–2.5)
ALT: 16 U/L (ref 6–29)
AST: 19 U/L (ref 10–35)
Albumin: 3.8 g/dL (ref 3.6–5.1)
Alkaline phosphatase (APISO): 76 U/L (ref 37–153)
BUN/Creatinine Ratio: 23 (calc) — ABNORMAL HIGH (ref 6–22)
BUN: 29 mg/dL — ABNORMAL HIGH (ref 7–25)
CO2: 31 mmol/L (ref 20–32)
Calcium: 10 mg/dL (ref 8.6–10.4)
Chloride: 99 mmol/L (ref 98–110)
Creat: 1.27 mg/dL — ABNORMAL HIGH (ref 0.60–1.00)
Globulin: 2.9 g/dL (ref 1.9–3.7)
Glucose, Bld: 61 mg/dL — ABNORMAL LOW (ref 65–99)
Potassium: 4.7 mmol/L (ref 3.5–5.3)
Sodium: 138 mmol/L (ref 135–146)
Total Bilirubin: 0.6 mg/dL (ref 0.2–1.2)
Total Protein: 6.7 g/dL (ref 6.1–8.1)
eGFR: 43 mL/min/{1.73_m2} — ABNORMAL LOW (ref >=60)

## 2023-01-19 LAB — MICROSCOPIC MESSAGE

## 2023-01-19 NOTE — Telephone Encounter (Signed)
Patient is requesting to switch from Dr. Izora Ribas to Dr. Bjorn Pippin. Please advise.

## 2023-01-19 NOTE — Telephone Encounter (Signed)
OK with me Chris  

## 2023-01-29 ENCOUNTER — Encounter: Payer: Self-pay | Admitting: Physician Assistant

## 2023-01-29 ENCOUNTER — Telehealth: Payer: Self-pay | Admitting: *Deleted

## 2023-01-29 ENCOUNTER — Ambulatory Visit: Payer: Medicare PPO | Attending: Physician Assistant | Admitting: Physician Assistant

## 2023-01-29 VITALS — BP 110/78 | HR 62 | Ht 62.5 in | Wt 155.7 lb

## 2023-01-29 DIAGNOSIS — N1832 Chronic kidney disease, stage 3b: Secondary | ICD-10-CM | POA: Diagnosis not present

## 2023-01-29 DIAGNOSIS — E782 Mixed hyperlipidemia: Secondary | ICD-10-CM

## 2023-01-29 DIAGNOSIS — I480 Paroxysmal atrial fibrillation: Secondary | ICD-10-CM | POA: Diagnosis not present

## 2023-01-29 DIAGNOSIS — R0989 Other specified symptoms and signs involving the circulatory and respiratory systems: Secondary | ICD-10-CM

## 2023-01-29 DIAGNOSIS — G4733 Obstructive sleep apnea (adult) (pediatric): Secondary | ICD-10-CM | POA: Diagnosis not present

## 2023-01-29 DIAGNOSIS — I10 Essential (primary) hypertension: Secondary | ICD-10-CM

## 2023-01-29 NOTE — Telephone Encounter (Signed)
Patient agreement reviewed and signed on 01/29/2023.  WatchPAT issued to patient on 01/29/2023 by Danielle Rankin, CMA. Patient aware to not open the WatchPAT box until contacted with the activation PIN. Patient profile initialized in CloudPAT on 01/29/2023 by Danielle Rankin, CMA. Device serial number: [id number]  Please list Reason for Call as Advice Only and type "WatchPAT issued to patient" in the comment box.

## 2023-01-29 NOTE — Patient Instructions (Addendum)
Medication Instructions:  Your physician recommends that you continue on your current medications as directed. Please refer to the Current Medication list given to you today.  *If you need a refill on your cardiac medications before your next appointment, please call your pharmacy*  Lab Work: BMET, MAG, CBC-TODAY If you have labs (blood work) drawn today and your tests are completely normal, you will receive your results only by: MyChart Message (if you have MyChart) OR A paper copy in the mail If you have any lab test that is abnormal or we need to change your treatment, we will call you to review the results.  Testing/Procedures: Your physician has recommended that you have a sleep study. This test records several body functions during sleep, including: brain activity, eye movement, oxygen and carbon dioxide blood levels, heart rate and rhythm, breathing rate and rhythm, the flow of air through your mouth and nose, snoring, body muscle movements, and chest and belly movement.   Follow-Up: At [facility], you and your health needs are our priority.  As part of our continuing mission to provide you with exceptional heart care, we have created designated Provider Care Teams.  These Care Teams include your primary Cardiologist (physician) and Advanced Practice Providers (APPs -  Physician Assistants and Nurse Practitioners) who all work together to provide you with the care you need, when you need it.  We recommend signing up for the patient portal called "MyChart".  Sign up information is provided on this After Visit Summary.  MyChart is used to connect with patients for Virtual Visits (Telemedicine).  Patients are able to view lab/test results, encounter notes, upcoming appointments, etc.  Non-urgent messages can be sent to your provider as well.   To learn more about what you can do with MyChart, go to [email or web address].    Your next appointment:   2-3 month(s) or next  available  Provider:   Dr Bjorn Pippin   Schedule ASAP with your GYN  Low-Sodium Eating Plan Salt (sodium) helps you keep a healthy balance of fluids in your body. Too much sodium can raise your blood pressure. It can also cause fluid and waste to be held in your body. Your health care provider or dietitian may recommend a low-sodium eating plan if you have high blood pressure (hypertension), kidney disease, liver disease, or heart failure. Eating less sodium can help lower your blood pressure and reduce swelling. It can also protect your heart, liver, and kidneys. What are tips for following this plan? Reading food labels  Check food labels for the amount of sodium per serving. If you eat more than one serving, you must multiply the listed amount by the number of servings. Choose foods with less than 140 milligrams (mg) of sodium per serving. Avoid foods with 300 mg of sodium or more per serving. Always check how much sodium is in a product, even if the label says "unsalted" or "no salt added." Shopping  Buy products labeled as "low-sodium" or "no salt added." Buy fresh foods. Avoid canned foods and pre-made or frozen meals. Avoid canned, cured, or processed meats. Buy breads that have less than 80 mg of sodium per slice. Cooking  Eat more home-cooked food. Try to eat less restaurant, buffet, and fast food. Try not to add salt when you cook. Use salt-free seasonings or herbs instead of table salt or sea salt. Check with your provider or pharmacist before using salt substitutes. Cook with plant-based oils, such as canola, sunflower, or olive oil.  Meal planning When eating at a restaurant, ask if your food can be made with less salt or no salt. Avoid dishes labeled as brined, pickled, cured, or smoked. Avoid dishes made with soy sauce, miso, or teriyaki sauce. Avoid foods that have monosodium glutamate (MSG) in them. MSG may be added to some restaurant food, sauces, soups, bouillon, and  canned foods. Make meals that can be grilled, baked, poached, roasted, or steamed. These are often made with less sodium. General information Try to limit your sodium intake to 1,500-2,300 mg each day, or the amount told by your provider. What foods should I eat? Fruits Fresh, frozen, or canned fruit. Fruit juice. Vegetables Fresh or frozen vegetables. "No salt added" canned vegetables. "No salt added" tomato sauce and paste. Low-sodium or reduced-sodium tomato and vegetable juice. Grains Low-sodium cereals, such as oats, puffed wheat and rice, and shredded wheat. Low-sodium crackers. Unsalted rice. Unsalted pasta. Low-sodium bread. Whole grain breads and whole grain pasta. Meats and other proteins Fresh or frozen meat, poultry, seafood, and fish. These should have no added salt. Low-sodium canned tuna and salmon. Unsalted nuts. Dried peas, beans, and lentils without added salt. Unsalted canned beans. Eggs. Unsalted nut butters. Dairy Milk. Soy milk. Cheese that is naturally low in sodium, such as ricotta cheese, fresh mozzarella, or Swiss cheese. Low-sodium or reduced-sodium cheese. Cream cheese. Yogurt. Seasonings and condiments Fresh and dried herbs and spices. Salt-free seasonings. Low-sodium mustard and ketchup. Sodium-free salad dressing. Sodium-free light mayonnaise. Fresh or refrigerated horseradish. Lemon juice. Vinegar. Other foods Homemade, reduced-sodium, or low-sodium soups. Unsalted popcorn and pretzels. Low-salt or salt-free chips. The items listed above may not be all the foods and drinks you can have. Talk to a dietitian to learn more. What foods should I avoid? Vegetables Sauerkraut, pickled vegetables, and relishes. Olives. [place] fries. Onion rings. Regular canned vegetables, except low-sodium or reduced-sodium items. Regular canned tomato sauce and paste. Regular tomato and vegetable juice. Frozen vegetables in sauces. Grains Instant hot cereals. Bread stuffing, pancake,  and biscuit mixes. Croutons. Seasoned rice or pasta mixes. Noodle soup cups. Boxed or frozen macaroni and cheese. Regular salted crackers. Self-rising flour. Meats and other proteins Meat or fish that is salted, canned, smoked, spiced, or pickled. Precooked or cured meat, such as sausages or meat loaves. Tomasa Blase. Ham. Pepperoni. Hot dogs. Corned beef. Chipped beef. Salt pork. Jerky. Pickled herring, anchovies, and sardines. Regular canned tuna. Salted nuts. Dairy Processed cheese and cheese spreads. Hard cheeses. Cheese curds. Blue cheese. Feta cheese. String cheese. Regular cottage cheese. Buttermilk. Canned milk. Fats and oils Salted butter. Regular margarine. Ghee. Bacon fat. Seasonings and condiments Onion salt, garlic salt, seasoned salt, table salt, and sea salt. Canned and packaged gravies. Worcestershire sauce. Tartar sauce. Barbecue sauce. Teriyaki sauce. Soy sauce, including reduced-sodium soy sauce. Steak sauce. Fish sauce. Oyster sauce. Cocktail sauce. Horseradish that you find on the shelf. Regular ketchup and mustard. Meat flavorings and tenderizers. Bouillon cubes. Hot sauce. Pre-made or packaged marinades. Pre-made or packaged taco seasonings. Relishes. Regular salad dressings. Salsa. Other foods Salted popcorn and pretzels. Corn chips and puffs. Potato and tortilla chips. Canned or dried soups. Pizza. Frozen entrees and pot pies. The items listed above may not be all the foods and drinks you should avoid. Talk to a dietitian to learn more. This information is not intended to replace advice given to you by your health care provider. Make sure you discuss any questions you have with your health care provider. Document Revised: 04/20/2022 Document Reviewed:  04/20/2022 Elsevier Patient Education  2024 [employer] Heart-Healthy Eating Plan Many factors influence your heart health, including eating and exercise habits. Heart health is also called coronary health. Coronary risk increases  with abnormal blood fat (lipid) levels. A heart-healthy eating plan includes limiting unhealthy fats, increasing healthy fats, limiting salt (sodium) intake, and making other diet and lifestyle changes. What is my plan? Your health care provider may recommend that: You limit your fat intake to _________% or less of your total calories each day. You limit your saturated fat intake to _________% or less of your total calories each day. You limit the amount of cholesterol in your diet to less than _________ mg per day. You limit the amount of sodium in your diet to less than _________ mg per day. What are tips for following this plan? Cooking Cook foods using methods other than frying. Baking, boiling, grilling, and broiling are all good options. Other ways to reduce fat include: Removing the skin from poultry. Removing all visible fats from meats. Steaming vegetables in water or broth. Meal planning  At meals, imagine dividing your plate into fourths: Fill one-half of your plate with vegetables and green salads. Fill one-fourth of your plate with whole grains. Fill one-fourth of your plate with lean protein foods. Eat 2-4 cups of vegetables per day. One cup of vegetables equals 1 cup (91 g) broccoli or cauliflower florets, 2 medium carrots, 1 large bell pepper, 1 large sweet potato, 1 large tomato, 1 medium white potato, 2 cups (150 g) raw leafy greens. Eat 1-2 cups of fruit per day. One cup of fruit equals 1 small apple, 1 large banana, 1 cup (237 g) mixed fruit, 1 large orange,  cup (82 g) dried fruit, 1 cup (240 mL) 100% fruit juice. Eat more foods that contain soluble fiber. Examples include apples, broccoli, carrots, beans, peas, and barley. Aim to get 25-30 g of fiber per day. Increase your consumption of legumes, nuts, and seeds to 4-5 servings per week. One serving of dried beans or legumes equals  cup (90 g) cooked, 1 serving of nuts is  oz (12 almonds, 24 pistachios, or 7 walnut  halves), and 1 serving of seeds equals  oz (8 g). Fats Choose healthy fats more often. Choose monounsaturated and polyunsaturated fats, such as olive and canola oils, avocado oil, flaxseeds, walnuts, almonds, and seeds. Eat more omega-3 fats. Choose salmon, mackerel, sardines, tuna, flaxseed oil, and ground flaxseeds. Aim to eat fish at least 2 times each week. Check food labels carefully to identify foods with trans fats or high amounts of saturated fat. Limit saturated fats. These are found in animal products, such as meats, butter, and cream. Plant sources of saturated fats include palm oil, palm kernel oil, and coconut oil. Avoid foods with partially hydrogenated oils in them. These contain trans fats. Examples are stick margarine, some tub margarines, cookies, crackers, and other baked goods. Avoid fried foods. General information Eat more home-cooked food and less restaurant, buffet, and fast food. Limit or avoid alcohol. Limit foods that are high in added sugar and simple starches such as foods made using white refined flour (white breads, pastries, sweets). Lose weight if you are overweight. Losing just 5-10% of your body weight can help your overall health and prevent diseases such as diabetes and heart disease. Monitor your sodium intake, especially if you have high blood pressure. Talk with your health care provider about your sodium intake. Try to incorporate more vegetarian meals weekly. What foods  should I eat? Fruits All fresh, canned (in natural juice), or frozen fruits. Vegetables Fresh or frozen vegetables (raw, steamed, roasted, or grilled). Green salads. Grains Most grains. Choose whole wheat and whole grains most of the time. Rice and pasta, including brown rice and pastas made with whole wheat. Meats and other proteins Lean, well-trimmed beef, veal, pork, and lamb. Chicken and [place] without skin. All fish and shellfish. Wild duck, rabbit, pheasant, and venison. Egg  whites or low-cholesterol egg substitutes. Dried beans, peas, lentils, and tofu. Seeds and most nuts. Dairy Low-fat or nonfat cheeses, including ricotta and mozzarella. Skim or 1% milk (liquid, powdered, or evaporated). Buttermilk made with low-fat milk. Nonfat or low-fat yogurt. Fats and oils Non-hydrogenated (trans-free) margarines. Vegetable oils, including soybean, sesame, sunflower, olive, avocado, peanut, safflower, corn, canola, and cottonseed. Salad dressings or mayonnaise made with a vegetable oil. Beverages Water (mineral or sparkling). Coffee and tea. Unsweetened ice tea. Diet beverages. Sweets and desserts Sherbet, gelatin, and fruit ice. Small amounts of dark chocolate. Limit all sweets and desserts. Seasonings and condiments All seasonings and condiments. The items listed above may not be a complete list of foods and beverages you can eat. Contact a dietitian for more options. What foods should I avoid? Fruits Canned fruit in heavy syrup. Fruit in cream or butter sauce. Fried fruit. Limit coconut. Vegetables Vegetables cooked in cheese, cream, or butter sauce. Fried vegetables. Grains Breads made with saturated or trans fats, oils, or whole milk. Croissants. Sweet rolls. Donuts. High-fat crackers, such as cheese crackers and chips. Meats and other proteins Fatty meats, such as hot dogs, ribs, sausage, bacon, rib-eye roast or steak. High-fat deli meats, such as salami and bologna. Caviar. Domestic duck and goose. Organ meats, such as liver. Dairy Cream, sour cream, cream cheese, and creamed cottage cheese. Whole-milk cheeses. Whole or 2% milk (liquid, evaporated, or condensed). Whole buttermilk. Cream sauce or high-fat cheese sauce. Whole-milk yogurt. Fats and oils Meat fat, or shortening. Cocoa butter, hydrogenated oils, palm oil, coconut oil, palm kernel oil. Solid fats and shortenings, including bacon fat, salt pork, lard, and butter. Nondairy cream substitutes. Salad  dressings with cheese or sour cream. Beverages Regular sodas and any drinks with added sugar. Sweets and desserts Frosting. Pudding. Cookies. Cakes. Pies. Milk chocolate or white chocolate. Buttered syrups. Full-fat ice cream or ice cream drinks. The items listed above may not be a complete list of foods and beverages to avoid. Contact a dietitian for more information. Summary Heart-healthy meal planning includes limiting unhealthy fats, increasing healthy fats, limiting salt (sodium) intake and making other diet and lifestyle changes. Lose weight if you are overweight. Losing just 5-10% of your body weight can help your overall health and prevent diseases such as diabetes and heart disease. Focus on eating a balance of foods, including fruits and vegetables, low-fat or nonfat dairy, lean protein, nuts and legumes, whole grains, and heart-healthy oils and fats. This information is not intended to replace advice given to you by your health care provider. Make sure you discuss any questions you have with your health care provider. Document Revised: 05/09/2021 Document Reviewed: 05/09/2021 Elsevier Patient Education  2024 [employer].

## 2023-01-29 NOTE — Progress Notes (Signed)
Cardiology Office Note:  .   Date:  01/29/2023  ID:  Jillian Rodgers, DOB 1945/06/20, MRN [id number] PCP: [name], MD  [facility] Providers Cardiologist:  [name], MD {  History of Present Illness: .   Jillian Rodgers is a 77 y.o. female with a past medical history of hypertension, hyperlipidemia, OSA not on CPAP, CKD, PAF on Eliquis with last DCCV 2022 with stopping AC after this in the setting of thrombocytopenia with low platelets here for follow-up appointment.  Patient noted that she was doing okay.  Recent ED evaluation for late fall hematoma after mechanical fall.  Saw Dr. Oneta Rack since for skin discoloration.  No chest pain or pressure.  No DOE/SOB.  No PND/orthopnea.  No weight gain or leg swelling.  No palpitations or syncope.  In the past she was symptomatic when she was in A-fib (palpitations with shortness of).  Ambulatory blood pressure quite variable.  1 month prior had BP 70/50 and was symptomatic.  They were concerned about vasovagal syncope.  Today, she tells me she has been having some bleeding which she thinks is vaginal that just started yesterday.  She wears a pad because sometimes she leaks urine and has noticed some blood on the pad.  She then noticed some clots in the toilet.  We have asked her to follow-up ASAP with GYN.  We discussed getting some labs today to check her hemoglobin.  She is on Eliquis for A-fib.  She was told by her primary to avoid bananas, oranges, and spinach which I am guessing is due to its high potassium content but patient is confused because she is on potassium supplementation.  I have asked her to clarify this with her primary.  She has an appointment with Dr. Lalla Brothers later this year to discuss Watchman which I think she would be a good candidate for.  She also needs a sleep study and potentially would be a candidate for inspire implantation since she is claustrophobic and cannot tolerate a CPAP mask.  Reports  no shortness of breath nor dyspnea on exertion. Reports no chest pain, pressure, or tightness. No edema, orthopnea, PND. Reports no palpitations.    ROS: Pertinent ROS in HPI  Studies Reviewed: .       Echocardiogram 01/12/2023 IMPRESSIONS     1. Left ventricular ejection fraction, by estimation, is 60 to 65%. The  left ventricle has normal function. The left ventricle has no regional  wall motion abnormalities. There is mild left ventricular hypertrophy.   2. Right ventricular systolic function is normal. The right ventricular  size is normal.   3. Left atrial size was severely dilated.   4. The mitral valve is normal in structure. No evidence of mitral valve  regurgitation. No evidence of mitral stenosis.   5. The aortic valve is tricuspid. Aortic valve regurgitation is not  visualized. No aortic stenosis is present.   6. The inferior vena cava is dilated in size with <50% respiratory  variability, suggesting right atrial pressure of 15 mmHg.   FINDINGS   Left Ventricle: Left ventricular ejection fraction, by estimation, is 60  to 65%. The left ventricle has normal function. The left ventricle has no  regional wall motion abnormalities. The left ventricular internal cavity  size was normal in size. There is   mild left ventricular hypertrophy.   Right Ventricle: The right ventricular size is normal. No increase in  right ventricular wall thickness. Right ventricular systolic function is  normal.   Left Atrium: Left atrial size was severely dilated.   Right Atrium: Right atrial size was normal in size.   Pericardium: There is no evidence of pericardial effusion.   Mitral Valve: The mitral valve is normal in structure. No evidence of  mitral valve stenosis.   Tricuspid Valve: The tricuspid valve is normal in structure. Tricuspid  valve regurgitation is not demonstrated. No evidence of tricuspid  stenosis.   Aortic Valve: The aortic valve is tricuspid. Aortic valve  regurgitation is  not visualized. No aortic stenosis is present.   Pulmonic Valve: The pulmonic valve was normal in structure. Pulmonic valve  regurgitation is not visualized. No evidence of pulmonic stenosis.   Aorta: The aortic root is normal in size and structure.   Venous: The inferior vena cava is dilated in size with less than 50%  respiratory variability, suggesting right atrial pressure of 15 mmHg.   IAS/Shunts: No atrial level shunt detected by color flow Doppler.  Risk Assessment/Calculations:    CHA2DS2-VASc Score = 6   This indicates a 9.7% annual risk of stroke. The patient's score is based upon: CHF History: 0 HTN History: 1 Diabetes History: 0 Stroke History: 2 Vascular Disease History: 0 Age Score: 2 Gender Score: 1    STOP-Bang Score:  3      Physical Exam:   VS:  BP 110/78   Pulse 62   Ht 5' 2.5" (1.588 m)   Wt 155 lb 11 oz (70.6 kg)   SpO2 94%   BMI 28.02 kg/m    Wt Readings from Last 3 Encounters:  01/29/23 155 lb 11 oz (70.6 kg)  01/18/23 153 lb (69.4 kg)  01/10/23 155 lb 13.8 oz (70.7 kg)    GEN: Well nourished, well developed in no acute distress NECK: No JVD; No carotid bruits CARDIAC: RRR, no murmurs, rubs, gallops RESPIRATORY:  Clear to auscultation without rales, wheezing or rhonchi  ABDOMEN: Soft, non-tender, non-distended EXTREMITIES:  No edema; No deformity   ASSESSMENT AND PLAN: .   1.  Paroxysmal atrial fibrillation -she is on Eliquis and having some bleeding -CBC and BMP today -f/u ASAP with GYN for further evaluation -She has a normal sinus rhythm today and compliant with her medications: Eliquis 5 mg twice daily, Lipitor 80 mg daily, Lasix 20 mg as needed, Lopressor 50 mg twice a day, potassium 20 mEq daily  2.  Hypertension -Blood pressure well-controlled today 110/78 -She presents with a BP log that shows anywhere from blood pressure of 96 systolic to 153.  Diastolic in the appropriate range.  Would continue current  medication regimen as prescribed -Continue to track blood pressure -Luckily no major side effects  3.  Labile hypertension -BP is good today  -BP log looks good -sometimes she has some weakness but likely unrelated to BP -She maintains an active lifestyle and recently drove to see musical and drove home the same day  4.  Stage IIIb CKD -Recent creatinine 1.27, likely at her baseline -Continue follow-up with PCP and avoiding nephrotoxic medication -We explained that her diuretics were through the kidneys and to use them sparingly.  Discussed conservative treatment for lower extremity edema including elevation and compression   5.  Hyperlipidemia   -LDL 53, HDL 72, triglycerides 94 -continue current medication regimen   Dispo: She can see Dr. Darryl Nestle at his next available appointment.  Signed, Sharlene Dory, PA-C

## 2023-01-30 ENCOUNTER — Ambulatory Visit: Payer: Medicare PPO | Admitting: Nurse Practitioner

## 2023-01-30 ENCOUNTER — Telehealth: Payer: Self-pay

## 2023-01-30 DIAGNOSIS — R7989 Other specified abnormal findings of blood chemistry: Secondary | ICD-10-CM | POA: Insufficient documentation

## 2023-01-30 DIAGNOSIS — N950 Postmenopausal bleeding: Secondary | ICD-10-CM | POA: Diagnosis not present

## 2023-01-30 LAB — CBC
Hematocrit: 35.4 % (ref 34.0–46.6)
Hemoglobin: 11.9 g/dL (ref 11.1–15.9)
MCH: 31.7 pg (ref 26.6–33.0)
MCHC: 33.6 g/dL (ref 31.5–35.7)
MCV: 94 fL (ref 79–97)
Platelets: 40 10*3/uL — CL (ref 150–450)
RBC: 3.75 x10E6/uL — ABNORMAL LOW (ref 3.77–5.28)
RDW: 13.2 % (ref 11.7–15.4)
WBC: 6.4 10*3/uL (ref 3.4–10.8)

## 2023-01-30 LAB — BASIC METABOLIC PANEL
BUN/Creatinine Ratio: 19 (ref 12–28)
BUN: 26 mg/dL (ref 8–27)
CO2: 25 mmol/L (ref 20–29)
Calcium: 9.9 mg/dL (ref 8.7–10.3)
Chloride: 105 mmol/L (ref 96–106)
Creatinine, Ser: 1.34 mg/dL — ABNORMAL HIGH (ref 0.57–1.00)
Glucose: 83 mg/dL (ref 70–99)
Potassium: 4.6 mmol/L (ref 3.5–5.2)
Sodium: 142 mmol/L (ref 134–144)
eGFR: 41 mL/min/{1.73_m2} — ABNORMAL LOW (ref >59)

## 2023-01-30 LAB — MAGNESIUM: Magnesium: 2.3 mg/dL (ref 1.6–2.3)

## 2023-01-30 NOTE — Telephone Encounter (Signed)
Reviewing labs in triage, pt with history of thrombocytopenia and PAF on Eliquis with current c/o genital bleeding. CBC done in office and advised to see gynocologist by Jari Favre, PA on 01/29/23. Labs resulted and CBC, H&H are normal, platelet count now down to 40k. Spoke with Jacinto Halim (DOD) who advised no changes at this time, but reiterate to patient to avoid all OTC NSAIDS.  Patient denies use of any of these. Did see OBGYN today and states she was very concerned with the dose of Eliquis pt is taking. She discovered that pt is having urethral bleeding and needs to see a urologist (she placed referral) and is scheduled for uterine ultrasound. Pt does see hematologist and wants results sent to them as well (completed). Routing note to [place] for final advisement on holding or altering dose of Eliquis.

## 2023-01-31 ENCOUNTER — Telehealth: Payer: Self-pay

## 2023-01-31 ENCOUNTER — Other Ambulatory Visit: Payer: Self-pay

## 2023-01-31 DIAGNOSIS — D509 Iron deficiency anemia, unspecified: Secondary | ICD-10-CM

## 2023-01-31 DIAGNOSIS — D696 Thrombocytopenia, unspecified: Secondary | ICD-10-CM

## 2023-01-31 NOTE — Telephone Encounter (Signed)
Pt returning nurse's call. Please advise

## 2023-01-31 NOTE — Telephone Encounter (Signed)
Pt advised that we will wait to see what Dr Jacinto Halim recommends... she say her vaginal bleeding has not worsened but she is worried and asking if there is a "lesser" anticoagulation plan she can be on.

## 2023-01-31 NOTE — Telephone Encounter (Signed)
Attempted to call pt. Per MD request on 9/20, pt was to be seen in our office my [place] and labs.  This was not scheduled. Called pt to advise I have scheduled lab and MD visit for her 02/22/23 at 1100 and 1130. LVM for call back.

## 2023-02-01 ENCOUNTER — Telehealth: Payer: Self-pay | Admitting: Hematology and Oncology

## 2023-02-01 NOTE — Telephone Encounter (Signed)
Left instructions below on voicemail per DPR and asked pt to call back if any questions.   Angelyn Punt, MD  Pugh, Juluis Pitch D, LPN; Bunnie Domino; Rachel Moulds, MD9 hours ago (6:30 AM)    Would recommend holding Eliquis given platelets <50.  She has appointment in a few weeks with Dr Al Pimple in hematology          Note

## 2023-02-01 NOTE — Telephone Encounter (Signed)
Spoke with patient confirming upcoming appointment  

## 2023-02-01 NOTE — Telephone Encounter (Signed)
Left the pt a message to call the office back to endorse recommendations per Jari Favre PA-C and Dr. Bjorn Pippin.

## 2023-02-01 NOTE — Telephone Encounter (Signed)
Would recommend holding Eliquis given platelets <50.  She has appointment in a few weeks with Dr Al Pimple in hematology

## 2023-02-01 NOTE — Telephone Encounter (Signed)
Pt was returning nurse call and is requesting a callback. Please advise. 

## 2023-02-01 NOTE — Telephone Encounter (Signed)
Left the pt a message to call the office back and she can have the operator reach out to any first available triage nurse, to endorse med hold.

## 2023-02-02 DIAGNOSIS — R3121 Asymptomatic microscopic hematuria: Secondary | ICD-10-CM | POA: Diagnosis not present

## 2023-02-02 DIAGNOSIS — D304 Benign neoplasm of urethra: Secondary | ICD-10-CM | POA: Diagnosis not present

## 2023-02-05 DIAGNOSIS — N950 Postmenopausal bleeding: Secondary | ICD-10-CM | POA: Diagnosis not present

## 2023-02-08 ENCOUNTER — Inpatient Hospital Stay: Payer: Medicare PPO | Admitting: Hematology and Oncology

## 2023-02-08 ENCOUNTER — Other Ambulatory Visit: Payer: Self-pay

## 2023-02-08 ENCOUNTER — Inpatient Hospital Stay: Payer: Medicare PPO | Attending: Hematology and Oncology

## 2023-02-08 VITALS — BP 137/86 | HR 60 | Temp 97.7°F | Resp 18 | Ht 62.5 in | Wt 158.4 lb

## 2023-02-08 DIAGNOSIS — R609 Edema, unspecified: Secondary | ICD-10-CM | POA: Diagnosis not present

## 2023-02-08 DIAGNOSIS — N939 Abnormal uterine and vaginal bleeding, unspecified: Secondary | ICD-10-CM | POA: Insufficient documentation

## 2023-02-08 DIAGNOSIS — E611 Iron deficiency: Secondary | ICD-10-CM | POA: Insufficient documentation

## 2023-02-08 DIAGNOSIS — I4891 Unspecified atrial fibrillation: Secondary | ICD-10-CM | POA: Insufficient documentation

## 2023-02-08 DIAGNOSIS — Z79899 Other long term (current) drug therapy: Secondary | ICD-10-CM | POA: Insufficient documentation

## 2023-02-08 DIAGNOSIS — D696 Thrombocytopenia, unspecified: Secondary | ICD-10-CM

## 2023-02-08 DIAGNOSIS — D751 Secondary polycythemia: Secondary | ICD-10-CM | POA: Diagnosis not present

## 2023-02-08 DIAGNOSIS — D509 Iron deficiency anemia, unspecified: Secondary | ICD-10-CM

## 2023-02-08 LAB — CMP (CANCER CENTER ONLY)
ALT: 12 U/L (ref 0–44)
AST: 17 U/L (ref 15–41)
Albumin: 3.6 g/dL (ref 3.5–5.0)
Alkaline Phosphatase: 57 U/L (ref 38–126)
Anion gap: 4 — ABNORMAL LOW (ref 5–15)
BUN: 22 mg/dL (ref 8–23)
CO2: 29 mmol/L (ref 22–32)
Calcium: 9.9 mg/dL (ref 8.9–10.3)
Chloride: 106 mmol/L (ref 98–111)
Creatinine: 1.06 mg/dL — ABNORMAL HIGH (ref 0.44–1.00)
GFR, Estimated: 54 mL/min — ABNORMAL LOW (ref >60)
Glucose, Bld: 84 mg/dL (ref 70–99)
Potassium: 4.8 mmol/L (ref 3.5–5.1)
Sodium: 139 mmol/L (ref 135–145)
Total Bilirubin: 0.5 mg/dL (ref 0.3–1.2)
Total Protein: 6.3 g/dL — ABNORMAL LOW (ref 6.5–8.1)

## 2023-02-08 LAB — CBC WITH DIFFERENTIAL (CANCER CENTER ONLY)
Abs Immature Granulocytes: 0.03 10*3/uL (ref 0.00–0.07)
Basophils Absolute: 0 10*3/uL (ref 0.0–0.1)
Basophils Relative: 1 %
Eosinophils Absolute: 0.1 10*3/uL (ref 0.0–0.5)
Eosinophils Relative: 2 %
HCT: 34.4 % — ABNORMAL LOW (ref 36.0–46.0)
Hemoglobin: 11.2 g/dL — ABNORMAL LOW (ref 12.0–15.0)
Immature Granulocytes: 1 %
Lymphocytes Relative: 28 %
Lymphs Abs: 1.7 10*3/uL (ref 0.7–4.0)
MCH: 31.1 pg (ref 26.0–34.0)
MCHC: 32.6 g/dL (ref 30.0–36.0)
MCV: 95.6 fL (ref 80.0–100.0)
Monocytes Absolute: 0.8 10*3/uL (ref 0.1–1.0)
Monocytes Relative: 13 %
Neutro Abs: 3.4 10*3/uL (ref 1.7–7.7)
Neutrophils Relative %: 55 %
Platelet Count: 32 10*3/uL — ABNORMAL LOW (ref 150–400)
RBC: 3.6 MIL/uL — ABNORMAL LOW (ref 3.87–5.11)
RDW: 13.3 % (ref 11.5–15.5)
WBC Count: 6 10*3/uL (ref 4.0–10.5)
nRBC: 0 % (ref 0.0–0.2)

## 2023-02-08 LAB — SAMPLE TO BLOOD BANK

## 2023-02-08 NOTE — Progress Notes (Signed)
[facility] [phone]   [phone]) [phone]  PROGRESS NOTE  Patient Care Team: [name], MD as PCP - General (Internal Medicine) [name], MD as PCP - Cardiology (Cardiology) Rachel Moulds, MD as PCP - Hematology/Oncology (Hematology and Oncology) Philip Aspen, DO as PCP - OBGYN (Obstetrics and Gynecology) Annie Sable, [name]  CHIEF COMPLAINTS/PURPOSE OF CONSULTATION:  Thrombocytopenia  HISTORY OF PRESENTING ILLNESS:   Jillian Rodgers 77 y.o. female returns for a follow up for thrombocytopenia.   The patient, with a history of atrial fibrillation and transient ischemic attacks, presents with worsening thrombocytopenia.She was previously on Eliquis, a blood thinner, but it was discontinued due to the low platelet count. The patient reports feeling "okay" but not very energetic. She has been sleeping a lot, which seems to be a pattern before her atrial fibrillation episodes. The patient also reports some urethral bleeding, which has been evaluated by a gynecologist and a urologist. She has been prescribed estrogen cream for this issue but has not started using it yet. The patient's spouse notes that the patient's headaches seemed to have improved while on Eliquis, but it is unclear if this is a direct effect of the medication or a circumstantial observation. They also were hoping to go on a 2 week cruise which pt doesn't believe is a good idea.  Rest of the pertinent 10 point ROS reviewed and negative  MEDICAL HISTORY:  Past Medical History:  Diagnosis Date   Anemia with low platelet count (HCC)    Chronic kidney disease    Degenerative joint disease    Dysrhythmia    a fib   GERD (gastroesophageal reflux disease)    History of kidney stones    Hyperlipidemia    Hypothyroidism    Inguinal hernia 02/19/2019   Labile hypertension    Migraines    neurontin helps   Pneumonia    hx  of walking pneumonia    Primary hypertension 04/28/2022    SURGICAL HISTORY: Past Surgical History:  Procedure Laterality Date   BILATERAL CATARACT SURGERY      BLERPHOROPLASTY \     CHOLECYSTECTOMY  1996   open   COLONOSCOPY  2009   recommended 10 year f/u   HERNIA REPAIR  1985   umb hernia rpr   HERNIA REPAIR  2005 & 2009   ventral hernia rpr   INGUINAL HERNIA REPAIR Left 04/30/2020   Procedure: OPEN LEFT INGUINAL HERNIA REPAIR WITH MESH;  Surgeon: Berna Bue, MD;  Location: [facility];  Service: General;  Laterality: Left;   INGUINAL HERNIA REPAIR Right 10/24/2021   Procedure: OPEN RIGHT INGUINAL HERNIA REPAIR WITH MESH ;  Surgeon: Berna Bue, MD;  Location: [facility];  Service: General;  Laterality: Right;   PILONIDAL CYST EXCISION  1972   SKIN CANCER REMOVAL      TONSILLECTOMY     UPPER GI ENDOSCOPY     VENTRAL HERNIA REPAIR      SOCIAL HISTORY: Social History   Socioeconomic History   Marital status: Married    Spouse name: Not on file   Number of children: 2   Years of education: 18   Highest education level: Not on file  Occupational History   Not on file  Tobacco Use   Smoking status: Never   Smokeless tobacco: Never  Vaping Use   Vaping status: Never Used  Substance and Sexual Activity   Alcohol use: No   Drug use: No  Sexual activity: Yes  Other Topics Concern   Not on file  Social History Narrative   Not on file   Social Determinants of Health   Financial Resource Strain: Not on file  Food Insecurity: No Food Insecurity (01/11/2023)   Hunger Vital Sign    Worried About Running Out of Food in the Last Year: Never true    Ran Out of Food in the Last Year: Never true  Transportation Needs: No Transportation Needs (01/11/2023)   PRAPARE - Administrator, Civil Service (Medical): No    Lack of Transportation (Non-Medical): No  Physical Activity: Not on file  Stress: Not on file  Social Connections: Not on file  Intimate Partner  Violence: Not At Risk (01/11/2023)   Humiliation, Afraid, Rape, and Kick questionnaire    Fear of Current or Ex-Partner: No    Emotionally Abused: No    Physically Abused: No    Sexually Abused: No    FAMILY HISTORY: Family History  Problem Relation Age of Onset   Ovarian cancer Mother    Heart disease Mother    Cancer Mother    Uterine cancer Mother    Hypertension Mother    Breast cancer Sister    Stroke Paternal Uncle    Congestive Heart Failure Father    Gout Maternal Aunt    Colon cancer Neg Hx    Esophageal cancer Neg Hx    Stomach cancer Neg Hx    Rectal cancer Neg Hx     ALLERGIES:  has No Known Allergies.  MEDICATIONS:  Current Outpatient Medications  Medication Sig Dispense Refill   apixaban (ELIQUIS) 5 MG TABS tablet Take 1 tablet (5 mg total) by mouth 2 (two) times daily. 180 tablet 0   Ascorbic Acid (VITAMIN C) 500 MG CAPS Take 500 mg by mouth daily.      atorvastatin (LIPITOR) 80 MG tablet Take 1 tablet (80 mg total) by mouth daily. 30 tablet 3   BOTOX 100 units SOLR injection Inject 100 Units into the muscle See admin instructions. Inject 100 units intramuscularly every 6-8 weeks     cetirizine (ZYRTEC) 10 MG tablet Take 10 mg by mouth daily.     Cholecalciferol (VITAMIN D3) 125 MCG (5000 UT) TABS Take 5,000 Units by mouth daily.     divalproex (DEPAKOTE ER) 500 MG 24 hr tablet Take 1 tablet (500 mg total) by mouth daily. 30 tablet 3   furosemide (LASIX) 20 MG tablet Take 1 tablet (20 mg total) by mouth daily as needed. (Patient taking differently: Take 20 mg by mouth daily as needed for fluid or edema.) 30 tablet 2   gabapentin (NEURONTIN) 300 MG capsule Take 300 mg (1 tablet) in the morning and 600 mg (2 tablets) at bedtime (Patient taking differently: Take 300-600 mg by mouth See admin instructions. Take 300 mg by mouth in the morning and 600 mg at bedtime) 90 capsule 3   levothyroxine (SYNTHROID) 50 MCG tablet Take  1 tablet  Daily  on an empty stomach with  only water for 30 minutes & no Antacid meds, Calcium or Magnesium for 4 hours & avoid Biotin                                                                                                                    /  TAKE                                         BY                                                 MOUTH (Patient taking differently: Take 50 mcg by mouth See admin instructions. Take 50 mcg by mouth in the morning on an empty stomach, with only water. Take no antacids, calcium, or magnesium for 4 hours and avoid biotin.) 90 tablet 3   Magnesium Oxide 400 MG CAPS Take 1 capsule (400 mg total) by mouth at bedtime.     metoprolol tartrate (LOPRESSOR) 50 MG tablet Take 1 tablet (50 mg total) by mouth 2 (two) times daily. 180 tablet 0   pantoprazole (PROTONIX) 40 MG tablet Take  1 tablet  2 x /day  for Acid Indigestion & Heartburn (Patient taking differently: Take 40 mg by mouth in the morning and at bedtime.) 180 tablet 3   potassium chloride SA (KLOR-CON M) 20 MEQ tablet Take 1 tablet (20 mEq total) by mouth daily. 90 tablet 0   Probiotic Product (PROBIOTIC PO) Take 1 capsule by mouth daily.     SYSTANE BALANCE 0.6 % SOLN Place 1 drop into both eyes at bedtime.     SYSTANE ULTRA 0.4-0.3 % SOLN Place 1 drop into both eyes in the morning.     TYLENOL 500 MG tablet Take 500-1,000 mg by mouth every 6 (six) hours as needed for mild pain or headache.     Zinc 30 MG TABS Take 30 mg by mouth daily.     No current facility-administered medications for this visit.    REVIEW OF SYSTEMS:   Constitutional: ( - ) fevers, ( - )  chills , ( - ) night sweats Eyes: ( - ) blurriness of vision, ( - ) double vision, ( - ) watery eyes Ears, nose, mouth, throat, and face: ( - ) mucositis, ( - ) sore throat Respiratory: ( - ) cough, ( - ) dyspnea, ( - ) wheezes Cardiovascular: ( - ) palpitation, ( - ) chest discomfort, ( - ) lower extremity  swelling Gastrointestinal:  ( - ) nausea, ( - ) heartburn, ( - ) change in bowel habits Skin: ( - ) abnormal skin rashes Lymphatics: ( - ) new lymphadenopathy, ( - ) easy bruising Neurological: ( - ) numbness, ( - ) tingling, ( - ) new weaknesses Behavioral/Psych: ( - ) mood change, ( - ) new changes  All other systems were reviewed with the patient and are negative.  PHYSICAL EXAMINATION: ECOG PERFORMANCE STATUS: 0 - Asymptomatic  Vitals:   02/08/23 1455  BP: 137/86  Pulse: 60  Resp: 18  Temp: 97.7 F (36.5 C)  SpO2: 97%   Filed Weights   02/08/23 1455  Weight: 158 lb 6.4 oz (71.8 kg)    Physical Exam Constitutional:      Appearance: Normal appearance.  Cardiovascular:     Rate and Rhythm: Normal rate and regular rhythm.     Pulses: Normal pulses.     Heart sounds: Normal heart sounds.  Pulmonary:     Effort: Pulmonary effort is normal.     Breath sounds: Normal breath  sounds.  Abdominal:     General: Abdomen is flat. Bowel sounds are normal.     Palpations: Abdomen is soft.  Musculoskeletal:        General: No swelling. Normal range of motion.     Cervical back: Normal range of motion and neck supple. No rigidity.  Lymphadenopathy:     Cervical: No cervical adenopathy.  Skin:    General: Skin is warm and dry.  Neurological:     General: No focal deficit present.     Mental Status: She is alert.      LABORATORY DATA:  I have reviewed the data as listed    Latest Ref Rng & Units 02/08/2023    2:30 PM 01/29/2023    3:46 PM 01/18/2023   12:19 PM  CBC  WBC 4.0 - 10.5 K/uL 6.0  6.4  10.0   Hemoglobin 12.0 - 15.0 g/dL 16.1  09.6  04.5   Hematocrit 36.0 - 46.0 % 34.4  35.4  38.2   Platelets 150 - 400 K/uL 32  40  68        Latest Ref Rng & Units 01/29/2023    3:46 PM 01/18/2023   12:19 PM 01/13/2023    5:37 AM  CMP  Glucose 70 - 99 mg/dL 83  61  98   BUN 8 - 27 mg/dL 26  29  25    Creatinine 0.57 - 1.00 mg/dL 4.09  8.11  9.14   Sodium 134 - 144 mmol/L  142  138  138   Potassium 3.5 - 5.2 mmol/L 4.6  4.7  4.3   Chloride 96 - 106 mmol/L 105  99  106   CO2 20 - 29 mmol/L 25  31  24    Calcium 8.7 - 10.3 mg/dL 9.9  78.2  9.2   Total Protein 6.1 - 8.1 g/dL  6.7    Total Bilirubin 0.2 - 1.2 mg/dL  0.6    AST 10 - 35 U/L  19    ALT 6 - 29 U/L  16      ASSESSMENT & PLAN Jillian Rodgers is a 77 y.o. female who presents for a follow up for polycythemia and thrombocytopenia.  #Thrombocytopenia:  --Patient has undergone serologic workup on November 2021 without no evidence of hepatitis, hypothyroidism, nutritional deficiencies.  --MR of the abdomen from May 2021 shows no evidence of liver disease or splenomegaly.  --Bone marrow biopsy from 03/19/2020 showed hypercellularity, no evidence of dyspoeisis, megakaryocytic hyperplasia.  --Flow cytometry negative for any monoclonal B cell population. Cytogenetics was normal.  --Given her worsening thrombocytopenia, I will recommend repeating a bone marrow biopsy. --Platelet count has been consistently low and is currently at 32. Previous workup including bone marrow biopsy did not reveal a clear cause. The patient is symptomatic with fatigue and exhaustion. -Plan to repeat bone marrow biopsy to investigate for possible myelodysplasia or other bone marrow disorder. -Check platelet count in 1 week and again at the time of the bone marrow biopsy. Added additional labs including repeat iron panel, ferritin, B12, rbc folate, hemolysis labs, ANA and hep panel  Atrial Fibrillation The patient was previously on Eliquis for stroke prevention, but this has been held due to low platelet count. -No blood thinners for now due to risk of bleeding with low platelet count. She understands increased risk of cardiovascular events without anticoagulation. -Will reassess need for anticoagulation once platelet count issue is resolved.  Edema The patient has been taking Lasix as needed for  swelling. - BLL crackles, likely  atelectasis. -Encourage blowing a balloon daily to expand lungs.  Vaginal Bleeding The patient has been experiencing urethral bleeding and has been prescribed estrogen cream, which she has not yet used. -Advise to use the prescribed vaginal estrogen cream. -Follow-up appointment with a surgeon scheduled for January if bleeding does not resolve.  Iron Deficiency The patient had a low-normal iron level in the spring of this year and received an iron infusion. -Check iron level at the time of the bone marrow biopsy.  General Health Maintenance -Encourage adherence to a healthy diet and regular exercise as tolerated.

## 2023-02-09 ENCOUNTER — Telehealth: Payer: Self-pay | Admitting: Hematology and Oncology

## 2023-02-09 NOTE — Telephone Encounter (Signed)
Spoke with patient confirming upcoming appointment  

## 2023-02-12 ENCOUNTER — Inpatient Hospital Stay: Payer: Medicare PPO

## 2023-02-12 ENCOUNTER — Other Ambulatory Visit: Payer: Self-pay | Admitting: Student

## 2023-02-12 DIAGNOSIS — Z79899 Other long term (current) drug therapy: Secondary | ICD-10-CM | POA: Diagnosis not present

## 2023-02-12 DIAGNOSIS — R609 Edema, unspecified: Secondary | ICD-10-CM | POA: Diagnosis not present

## 2023-02-12 DIAGNOSIS — I4891 Unspecified atrial fibrillation: Secondary | ICD-10-CM | POA: Diagnosis not present

## 2023-02-12 DIAGNOSIS — Z01812 Encounter for preprocedural laboratory examination: Secondary | ICD-10-CM

## 2023-02-12 DIAGNOSIS — E611 Iron deficiency: Secondary | ICD-10-CM | POA: Diagnosis not present

## 2023-02-12 DIAGNOSIS — M542 Cervicalgia: Secondary | ICD-10-CM | POA: Diagnosis not present

## 2023-02-12 DIAGNOSIS — D751 Secondary polycythemia: Secondary | ICD-10-CM | POA: Diagnosis not present

## 2023-02-12 DIAGNOSIS — D696 Thrombocytopenia, unspecified: Secondary | ICD-10-CM | POA: Diagnosis not present

## 2023-02-12 DIAGNOSIS — M791 Myalgia, unspecified site: Secondary | ICD-10-CM | POA: Diagnosis not present

## 2023-02-12 DIAGNOSIS — N939 Abnormal uterine and vaginal bleeding, unspecified: Secondary | ICD-10-CM | POA: Diagnosis not present

## 2023-02-12 DIAGNOSIS — G43019 Migraine without aura, intractable, without status migrainosus: Secondary | ICD-10-CM | POA: Diagnosis not present

## 2023-02-12 DIAGNOSIS — G518 Other disorders of facial nerve: Secondary | ICD-10-CM | POA: Diagnosis not present

## 2023-02-12 LAB — CBC WITH DIFFERENTIAL/PLATELET
Abs Immature Granulocytes: 0.05 10*3/uL (ref 0.00–0.07)
Basophils Absolute: 0 10*3/uL (ref 0.0–0.1)
Basophils Relative: 1 %
Eosinophils Absolute: 0.1 10*3/uL (ref 0.0–0.5)
Eosinophils Relative: 2 %
HCT: 35.9 % — ABNORMAL LOW (ref 36.0–46.0)
Hemoglobin: 11.7 g/dL — ABNORMAL LOW (ref 12.0–15.0)
Immature Granulocytes: 1 %
Lymphocytes Relative: 31 %
Lymphs Abs: 1.8 10*3/uL (ref 0.7–4.0)
MCH: 31.2 pg (ref 26.0–34.0)
MCHC: 32.6 g/dL (ref 30.0–36.0)
MCV: 95.7 fL (ref 80.0–100.0)
Monocytes Absolute: 0.7 10*3/uL (ref 0.1–1.0)
Monocytes Relative: 12 %
Neutro Abs: 3.3 10*3/uL (ref 1.7–7.7)
Neutrophils Relative %: 53 %
Platelets: 34 10*3/uL — ABNORMAL LOW (ref 150–400)
RBC: 3.75 MIL/uL — ABNORMAL LOW (ref 3.87–5.11)
RDW: 13.3 % (ref 11.5–15.5)
WBC: 6 10*3/uL (ref 4.0–10.5)
nRBC: 0 % (ref 0.0–0.2)

## 2023-02-12 LAB — RETICULOCYTES
Immature Retic Fract: 10.6 % (ref 2.3–15.9)
RBC.: 3.66 MIL/uL — ABNORMAL LOW (ref 3.87–5.11)
Retic Count, Absolute: 64.8 10*3/uL (ref 19.0–186.0)
Retic Ct Pct: 1.8 % (ref 0.4–3.1)

## 2023-02-12 LAB — HEPATITIS PANEL, ACUTE
HCV Ab: NONREACTIVE
Hep A IgM: NONREACTIVE
Hep B C IgM: NONREACTIVE
Hepatitis B Surface Ag: NONREACTIVE

## 2023-02-12 LAB — IRON AND IRON BINDING CAPACITY (CC-WL,HP ONLY)
Iron: 66 ug/dL (ref 28–170)
Saturation Ratios: 18 % (ref 10.4–31.8)
TIBC: 375 ug/dL (ref 250–450)
UIBC: 309 ug/dL (ref 148–442)

## 2023-02-12 LAB — LACTATE DEHYDROGENASE: LDH: 176 U/L (ref 98–192)

## 2023-02-12 LAB — VITAMIN B12: Vitamin B-12: 429 pg/mL (ref 180–914)

## 2023-02-12 LAB — FERRITIN: Ferritin: 28 ng/mL (ref 11–307)

## 2023-02-13 ENCOUNTER — Ambulatory Visit (HOSPITAL_COMMUNITY)
Admission: RE | Admit: 2023-02-13 | Discharge: 2023-02-13 | Disposition: A | Discharge status: Home / Self Care | Payer: Medicare PPO | Source: Ambulatory Visit | Attending: Hematology and Oncology | Admitting: Hematology and Oncology

## 2023-02-13 ENCOUNTER — Encounter (HOSPITAL_COMMUNITY): Payer: Self-pay

## 2023-02-13 ENCOUNTER — Other Ambulatory Visit: Payer: Self-pay

## 2023-02-13 ENCOUNTER — Inpatient Hospital Stay (HOSPITAL_COMMUNITY)
Admission: RE | Admit: 2023-02-13 | Discharge: 2023-02-13 | Discharge status: Home / Self Care | Payer: Medicare PPO | Source: Ambulatory Visit | Attending: Hematology and Oncology

## 2023-02-13 DIAGNOSIS — D649 Anemia, unspecified: Secondary | ICD-10-CM | POA: Insufficient documentation

## 2023-02-13 DIAGNOSIS — D7589 Other specified diseases of blood and blood-forming organs: Secondary | ICD-10-CM | POA: Diagnosis not present

## 2023-02-13 DIAGNOSIS — D696 Thrombocytopenia, unspecified: Secondary | ICD-10-CM | POA: Insufficient documentation

## 2023-02-13 DIAGNOSIS — Z1379 Encounter for other screening for genetic and chromosomal anomalies: Secondary | ICD-10-CM | POA: Insufficient documentation

## 2023-02-13 DIAGNOSIS — Z01812 Encounter for preprocedural laboratory examination: Secondary | ICD-10-CM

## 2023-02-13 LAB — FOLATE RBC
Folate, Hemolysate: 323 ng/mL
Folate, RBC: 895 ng/mL (ref >498)
Hematocrit: 36.1 % (ref 34.0–46.6)

## 2023-02-13 LAB — ANTINUCLEAR ANTIBODIES, IFA: ANA Ab, IFA: NEGATIVE

## 2023-02-13 LAB — CBC
HCT: 34.3 % — ABNORMAL LOW (ref 36.0–46.0)
Hemoglobin: 11.5 g/dL — ABNORMAL LOW (ref 12.0–15.0)
MCH: 32.1 pg (ref 26.0–34.0)
MCHC: 33.5 g/dL (ref 30.0–36.0)
MCV: 95.8 fL (ref 80.0–100.0)
Platelets: 33 10*3/uL — ABNORMAL LOW (ref 150–400)
RBC: 3.58 MIL/uL — ABNORMAL LOW (ref 3.87–5.11)
RDW: 13.1 % (ref 11.5–15.5)
WBC: 6.3 10*3/uL (ref 4.0–10.5)
nRBC: 0 % (ref 0.0–0.2)

## 2023-02-13 MED ORDER — FENTANYL CITRATE (PF) 100 MCG/2ML IJ SOLN
INTRAMUSCULAR | Status: AC
  Filled 2023-02-13: qty 2

## 2023-02-13 MED ORDER — MIDAZOLAM HCL 2 MG/2ML IJ SOLN
INTRAMUSCULAR | Status: AC
  Filled 2023-02-13: qty 2

## 2023-02-13 MED ORDER — FENTANYL CITRATE (PF) 100 MCG/2ML IJ SOLN
INTRAMUSCULAR | Status: AC | PRN
  Administered 2023-02-13: 50 ug via INTRAVENOUS

## 2023-02-13 MED ORDER — MIDAZOLAM HCL 2 MG/2ML IJ SOLN
INTRAMUSCULAR | Status: AC | PRN
Start: 2023-02-13 — End: 2023-02-13
  Administered 2023-02-13: 1 mg via INTRAVENOUS

## 2023-02-13 NOTE — Procedures (Signed)
Interventional Radiology Procedure Note  Procedure: CT guided aspirate and core biopsy of right iliac bone  Complications: None  Recommendations: - Bedrest supine x 1 hrs - Hydrocodone PRN  Pain - Follow biopsy results   Arel Tippen, MD   

## 2023-02-13 NOTE — H&P (Signed)
Chief Complaint: Patient was seen in consultation today for bone marrow biposy   Referring Physician(s): Iruku,Praveena  Supervising Physician: Marliss Coots  Patient Status: [facility] - Out-pt  History of Present Illness: Jillian Rodgers is a 77 y.o. female being worked up for thrombocytopenia.  She is referred for bone marrow biopsy. Has had this procedure done before in 2021 but doesn't recall much of it.  PMHx, meds, labs, imaging, allergies reviewed. Feels well, no recent fevers, chills, illness. Has been NPO today as directed.    Past Medical History:  Diagnosis Date   Anemia with low platelet count (HCC)    Chronic kidney disease    Degenerative joint disease    Dysrhythmia    a fib   GERD (gastroesophageal reflux disease)    History of kidney stones    Hyperlipidemia    Hypothyroidism    Inguinal hernia 02/19/2019   Labile hypertension    Migraines    neurontin helps   Pneumonia    hx of walking pneumonia    Primary hypertension 04/28/2022    Past Surgical History:  Procedure Laterality Date   BILATERAL CATARACT SURGERY      BLERPHOROPLASTY \     CHOLECYSTECTOMY  1996   open   COLONOSCOPY  2009   recommended 10 year f/u   HERNIA REPAIR  1985   umb hernia rpr   HERNIA REPAIR  2005 & 2009   ventral hernia rpr   INGUINAL HERNIA REPAIR Left 04/30/2020   Procedure: OPEN LEFT INGUINAL HERNIA REPAIR WITH MESH;  Surgeon: Berna Bue, MD;  Location: [facility];  Service: General;  Laterality: Left;   INGUINAL HERNIA REPAIR Right 10/24/2021   Procedure: OPEN RIGHT INGUINAL HERNIA REPAIR WITH MESH ;  Surgeon: Berna Bue, MD;  Location: [facility];  Service: General;  Laterality: Right;   PILONIDAL CYST EXCISION  1972   SKIN CANCER REMOVAL      TONSILLECTOMY     UPPER GI ENDOSCOPY     VENTRAL HERNIA REPAIR      Allergies: Patient has no known allergies.  Medications: Prior to Admission medications   Medication Sig Start Date End Date Taking?  Authorizing Provider  apixaban (ELIQUIS) 5 MG TABS tablet Take 1 tablet (5 mg total) by mouth 2 (two) times daily. 01/13/23 04/13/23  Carollee Herter, DO  Ascorbic Acid (VITAMIN C) 500 MG CAPS Take 500 mg by mouth daily.     [provider]  atorvastatin (LIPITOR) 80 MG tablet Take 1 tablet (80 mg total) by mouth daily. 10/19/22   Rai, Ripudeep Kirtland Bouchard, MD  BOTOX 100 units SOLR injection Inject 100 Units into the muscle See admin instructions. Inject 100 units intramuscularly every 6-8 weeks 10/21/20   [provider]  cetirizine (ZYRTEC) 10 MG tablet Take 10 mg by mouth daily.    [provider]  Cholecalciferol (VITAMIN D3) 125 MCG (5000 UT) TABS Take 5,000 Units by mouth daily.    [provider]  divalproex (DEPAKOTE ER) 500 MG 24 hr tablet Take 1 tablet (500 mg total) by mouth daily. 10/18/22   Rai, Delene Ruffini, MD  furosemide (LASIX) 20 MG tablet Take 1 tablet (20 mg total) by mouth daily as needed. Patient taking differently: Take 20 mg by mouth daily as needed for fluid or edema. 01/03/23   Adela Glimpse, NP  gabapentin (NEURONTIN) 300 MG capsule Take 300 mg (1 tablet) in the morning and 600 mg (2 tablets) at bedtime Patient taking differently:  Take 300-600 mg by mouth See admin instructions. Take 300 mg by mouth in the morning and 600 mg at bedtime 10/18/22   Rai, Delene Ruffini, MD  levothyroxine (SYNTHROID) 50 MCG tablet Take  1 tablet  Daily  on an empty stomach with only water for 30 minutes & no Antacid meds, Calcium or Magnesium for 4 hours & avoid Biotin                                                                                                                    /                                                                   TAKE                                         BY                                                 MOUTH Patient taking differently: Take 50 mcg by mouth See admin instructions. Take 50 mcg by mouth in the morning on an empty stomach, with only  water. Take no antacids, calcium, or magnesium for 4 hours and avoid biotin. 11/15/22   [name], MD  Magnesium Oxide 400 MG CAPS Take 1 capsule (400 mg total) by mouth at bedtime. 01/13/23   Carollee Herter, DO  metoprolol tartrate (LOPRESSOR) 50 MG tablet Take 1 tablet (50 mg total) by mouth 2 (two) times daily. 01/13/23 04/13/23  Carollee Herter, DO  pantoprazole (PROTONIX) 40 MG tablet Take  1 tablet  2 x /day  for Acid Indigestion & Heartburn Patient taking differently: Take 40 mg by mouth in the morning and at bedtime. 02/01/22   [name], MD  potassium chloride SA (KLOR-CON M) 20 MEQ tablet Take 1 tablet (20 mEq total) by mouth daily. 01/13/23 04/13/23  Carollee Herter, DO  Probiotic Product (PROBIOTIC PO) Take 1 capsule by mouth daily.    [provider]  SYSTANE BALANCE 0.6 % SOLN Place 1 drop into both eyes at bedtime.    [provider]  SYSTANE ULTRA 0.4-0.3 % SOLN Place 1 drop into both eyes in the morning.    [provider]  TYLENOL 500 MG tablet Take 500-1,000 mg by mouth every 6 (six) hours as needed for mild pain or headache.    [provider]  Zinc 30 MG TABS Take 30 mg by mouth daily.    [provider]     [facility]  History  Problem Relation Age of Onset   Ovarian cancer Mother    Heart disease Mother    Cancer Mother    Uterine cancer Mother    Hypertension Mother    Breast cancer Sister    Stroke Paternal Uncle    Congestive Heart Failure Father    Gout Maternal Aunt    Colon cancer Neg Hx    Esophageal cancer Neg Hx    Stomach cancer Neg Hx    Rectal cancer Neg Hx     Social History   Socioeconomic History   Marital status: Married    Spouse name: Not on file   Number of children: 2   Years of education: 18   Highest education level: Not on file  Occupational History   Not on file  Tobacco Use   Smoking status: Never   Smokeless tobacco: Never  Vaping Use   Vaping status: Never Used  Substance and Sexual  Activity   Alcohol use: No   Drug use: No   Sexual activity: Yes  Other Topics Concern   Not on file  Social History Narrative   Not on file   Social Determinants of Health   Financial Resource Strain: Not on file  Food Insecurity: No Food Insecurity (01/11/2023)   Hunger Vital Sign    Worried About Running Out of Food in the Last Year: Never true    Ran Out of Food in the Last Year: Never true  Transportation Needs: No Transportation Needs (01/11/2023)   PRAPARE - Administrator, Civil Service (Medical): No    Lack of Transportation (Non-Medical): No  Physical Activity: Not on file  Stress: Not on file  Social Connections: Not on file    Review of Systems: A 12 point ROS discussed and pertinent positives are indicated in the HPI above.  All other systems are negative.  Review of Systems  Vital Signs: BP (!) 162/100 (BP Location: Left Arm)   Pulse 61   Temp 98.4 F (36.9 C) (Oral)   Resp 18   Ht 5' 2.5" (1.588 m)   Wt 158 lb 6.4 oz (71.9 kg)   SpO2 96%   BMI 28.51 kg/m   Physical Exam Constitutional:      Appearance: She is not ill-appearing.  HENT:     Mouth/Throat:     Mouth: Mucous membranes are moist.  Cardiovascular:     Rate and Rhythm: Normal rate and regular rhythm.     Heart sounds: Normal heart sounds.  Pulmonary:     Effort: Pulmonary effort is normal. No respiratory distress.     Breath sounds: Normal breath sounds.  Skin:    General: Skin is warm and dry.  Neurological:     General: No focal deficit present.     Mental Status: She is alert and oriented to person, place, and time.  Psychiatric:        Mood and Affect: Mood normal.        Thought Content: Thought content normal.        Judgment: Judgment normal.     Imaging: No results found.  Labs:  CBC: Recent Labs    01/18/23 1219 01/29/23 1546 02/08/23 1430 02/12/23 1456  WBC 10.0 6.4 6.0 6.0  HGB 12.2 11.9 11.2* 11.7*  HCT 38.2 35.4 34.4* 35.9*  PLT 68* 40* 32*  34*    COAGS: Recent Labs    01/09/23 1535  INR 0.9    BMP: Recent Labs  01/11/23 0040 01/12/23 0258 01/13/23 0537 01/18/23 1219 01/29/23 1546 02/08/23 1430  NA 139 138 138 138 142 139  K 3.5 4.9 4.3 4.7 4.6 4.8  CL 105 106 106 99 105 106  CO2 25 23 24 31 25 29   GLUCOSE 111* 104* 98 61* 83 84  BUN 25* 23 25* 29* 26 22  CALCIUM 9.0 9.1 9.2 10.0 9.9 9.9  CREATININE 0.88 1.09* 1.04* 1.27* 1.34* 1.06*  GFRNONAA >60 52* 55*  --   --  54*    LIVER FUNCTION TESTS: Recent Labs    07/13/22 1236 09/26/22 1547 10/17/22 1129 01/03/23 1533 01/09/23 1535 01/18/23 1219 02/08/23 1430  BILITOT 0.6   < > 0.8 0.6 0.4 0.6 0.5  AST 15   < > 18 20 24 19 17   ALT 9   < > 13 16 21 16 12   ALKPHOS 77  --  69  --  57  --  57  PROT 6.9   < > 7.6 6.1 6.9 6.7 6.3*  ALBUMIN 4.0  --  4.9  --  3.2*  --  3.6   < > = values in this interval not displayed.     Assessment and Plan: Thrombocytopenia For bone marrow biopsy. Labs reviewed. Risks and benefits of bone marrow biopsy was discussed with the patient and/or patient's family including, but not limited to bleeding, infection, damage to adjacent structures or low yield requiring additional tests.  All of the questions were answered and there is agreement to proceed.  Consent signed and in chart.    Electronically Signed: Brayton El, PA-C 02/13/2023, 8:09 AM   I spent a total of 20 minutes in face to face in clinical consultation, greater than 50% of which was counseling/coordinating care for bone marrow biopsy

## 2023-02-13 NOTE — Discharge Instructions (Signed)
Please call Interventional Radiology clinic [phone] with any questions or concerns. ? ?You may remove your dressing and shower tomorrow. ? ? ?Bone Marrow Aspiration and Bone Marrow Biopsy, Adult, Care After ?This sheet gives you information about how to care for yourself after your procedure. Your health care provider may also give you more specific instructions. If you have problems or questions, contact your health care provider. ?What can I expect after the procedure? ?After the procedure, it is common to have: ?Mild pain and tenderness. ?Swelling. ?Bruising. ?Follow these instructions at home: ?Puncture site care ?Follow instructions from your health care provider about how to take care of the puncture site. Make sure you: ?Wash your hands with soap and water before and after you change your bandage (dressing). If soap and water are not available, use hand sanitizer. ?Change your dressing as told by your health care provider. ?Check your puncture site every day for signs of infection. Check for: ?More redness, swelling, or pain. ?Fluid or blood. ?Warmth. ?Pus or a bad smell.   ?Activity ?Return to your normal activities as told by your health care provider. Ask your health care provider what activities are safe for you. ?Do not lift anything that is heavier than 10 lb (4.5 kg), or the limit that you are told, until your health care provider says that it is safe. ?Do not drive for 24 hours if you were given a sedative during your procedure. ?General instructions ?Take over-the-counter and prescription medicines only as told by your health care provider. ?Do not take baths, swim, or use a hot tub until your health care provider approves. Ask your health care provider if you may take showers. You may only be allowed to take sponge baths. ?If directed, put ice on the affected area. To do this: ?Put ice in a plastic bag. ?Place a towel between your skin and the bag. ?Leave the ice on for 20 minutes, 2-3 times a  day. ?Keep all follow-up visits as told by your health care provider. This is important.   ?Contact a health care provider if: ?Your pain is not controlled with medicine. ?You have a fever. ?You have more redness, swelling, or pain around the puncture site. ?You have fluid or blood coming from the puncture site. ?Your puncture site feels warm to the touch. ?You have pus or a bad smell coming from the puncture site. ?Summary ?After the procedure, it is common to have mild pain, tenderness, swelling, and bruising. ?Follow instructions from your health care provider about how to take care of the puncture site and what activities are safe for you. ?Take over-the-counter and prescription medicines only as told by your health care provider. ?Contact a health care provider if you have any signs of infection, such as fluid or blood coming from the puncture site. ?This information is not intended to replace advice given to you by your health care provider. Make sure you discuss any questions you have with your health care provider. ?Document Revised: 08/20/2018 Document Reviewed: 08/20/2018 ?Elsevier Patient Education ? 2021 [employer] ? ? ?Moderate Conscious Sedation, Adult, Care After ?This sheet gives you information about how to care for yourself after your procedure. Your health care provider may also give you more specific instructions. If you have problems or questions, contact your health care provider. ?What can I expect after the procedure? ?After the procedure, it is common to have: ?Sleepiness for several hours. ?Impaired judgment for several hours. ?Difficulty with balance. ?Vomiting if you eat too   soon. ?Follow these instructions at home: ?For the time period you were told by your health care provider: ?Rest. ?Do not participate in activities where you could fall or become injured. ?Do not drive or use machinery. ?Do not drink alcohol. ?Do not take sleeping pills or medicines that cause drowsiness. ?Do not  make important decisions or sign legal documents. ?Do not take care of children on your own.  ?  ?  ?Eating and drinking ?Follow the diet recommended by your health care provider. ?Drink enough fluid to keep your urine pale yellow. ?If you vomit: ?Drink water, juice, or soup when you can drink without vomiting. ?Make sure you have little or no nausea before eating solid foods.   ?General instructions ?Take over-the-counter and prescription medicines only as told by your health care provider. ?Have a responsible adult stay with you for the time you are told. It is important to have someone help care for you until you are awake and alert. ?Do not smoke. ?Keep all follow-up visits as told by your health care provider. This is important. ?Contact a health care provider if: ?You are still sleepy or having trouble with balance after 24 hours. ?You feel light-headed. ?You keep feeling nauseous or you keep vomiting. ?You develop a rash. ?You have a fever. ?You have redness or swelling around the IV site. ?Get help right away if: ?You have trouble breathing. ?You have new-onset confusion at home. ?Summary ?After the procedure, it is common to feel sleepy, have impaired judgment, or feel nauseous if you eat too soon. ?Rest after you get home. Know the things you should not do after the procedure. ?Follow the diet recommended by your health care provider and drink enough fluid to keep your urine pale yellow. ?Get help right away if you have trouble breathing or new-onset confusion at home. ?This information is not intended to replace advice given to you by your health care provider. Make sure you discuss any questions you have with your health care provider. ?Document Revised: 08/01/2019 Document Reviewed: 02/27/2019 ?Elsevier Patient Education ? 2021 Elsevier Inc.  ?

## 2023-02-16 LAB — SURGICAL PATHOLOGY

## 2023-02-19 ENCOUNTER — Telehealth: Payer: Self-pay | Admitting: Nurse Practitioner

## 2023-02-19 MED ORDER — ATORVASTATIN CALCIUM 80 MG PO TABS: 80.0000 mg | ORAL_TABLET | Freq: Every day | ORAL | 3 refills | Status: DC

## 2023-02-19 MED ORDER — GABAPENTIN 300 MG PO CAPS: ORAL_CAPSULE | ORAL | 3 refills | Status: DC

## 2023-02-19 MED ORDER — DIVALPROEX SODIUM ER 500 MG PO TB24: 500.0000 mg | ORAL_TABLET | Freq: Every day | ORAL | 3 refills | Status: DC

## 2023-02-19 NOTE — Addendum Note (Signed)
Addended by: Dionicio Stall on: 02/19/2023 11:27 AM   Modules accepted: Orders

## 2023-02-19 NOTE — Telephone Encounter (Signed)
Refill request on Divalproex, gabapentin, atorvastatin. Pharm- [phone] [address]. The prescribing physician is Dr. Thad Ranger. She said they couldn't feel the scripts because she isn't an established patient.

## 2023-02-22 ENCOUNTER — Inpatient Hospital Stay: Payer: Medicare PPO | Attending: Hematology and Oncology | Admitting: Hematology and Oncology

## 2023-02-22 ENCOUNTER — Other Ambulatory Visit: Payer: Medicare PPO

## 2023-02-22 ENCOUNTER — Ambulatory Visit: Payer: Medicare PPO | Admitting: Hematology and Oncology

## 2023-02-22 VITALS — BP 102/65 | HR 64 | Temp 97.5°F | Resp 16 | Wt 156.1 lb

## 2023-02-22 DIAGNOSIS — D696 Thrombocytopenia, unspecified: Secondary | ICD-10-CM | POA: Diagnosis not present

## 2023-02-22 DIAGNOSIS — D693 Immune thrombocytopenic purpura: Secondary | ICD-10-CM | POA: Diagnosis not present

## 2023-02-22 DIAGNOSIS — E611 Iron deficiency: Secondary | ICD-10-CM | POA: Diagnosis not present

## 2023-02-22 DIAGNOSIS — Z803 Family history of malignant neoplasm of breast: Secondary | ICD-10-CM | POA: Diagnosis not present

## 2023-02-22 DIAGNOSIS — Z79899 Other long term (current) drug therapy: Secondary | ICD-10-CM | POA: Diagnosis not present

## 2023-02-22 DIAGNOSIS — Z808 Family history of malignant neoplasm of other organs or systems: Secondary | ICD-10-CM | POA: Insufficient documentation

## 2023-02-22 DIAGNOSIS — D751 Secondary polycythemia: Secondary | ICD-10-CM | POA: Diagnosis not present

## 2023-02-22 DIAGNOSIS — I4891 Unspecified atrial fibrillation: Secondary | ICD-10-CM | POA: Diagnosis not present

## 2023-02-22 MED ORDER — DEXAMETHASONE 4 MG PO TABS: 20.0000 mg | ORAL_TABLET | Freq: Every day | ORAL | 0 refills | Status: DC

## 2023-02-22 NOTE — Progress Notes (Signed)
[facility] Telephone:([phone]) [phone]   [phone]  PROGRESS NOTE  Patient Care Team: [name], MD as PCP - General (Internal Medicine) [name], MD as PCP - Cardiology (Cardiology) Rachel Moulds, MD as PCP - Hematology/Oncology (Hematology and Oncology) Philip Aspen, DO as PCP - OBGYN (Obstetrics and Gynecology) Annie Sable, [name]  CHIEF COMPLAINTS/PURPOSE OF CONSULTATION:  Thrombocytopenia  HISTORY OF PRESENTING ILLNESS:   Jillian Rodgers 77 y.o. female returns for a follow up for thrombocytopenia.   The patient, with a history of atrial fibrillation and transient ischemic attacks, presents with worsening thrombocytopenia.She was previously on Eliquis, a blood thinner, but it was discontinued due to the low platelet count. The patient reports feeling "okay" but not very energetic. She has been sleeping a lot, which seems to be a pattern before her atrial fibrillation episodes. The patient also reports some urethral bleeding, which has been evaluated by a gynecologist and a urologist. She has been prescribed estrogen cream for this issue but has not started using it yet. The patient's spouse notes that the patient's headaches seemed to have improved while on Eliquis, but it is unclear if this is a direct effect of the medication or a circumstantial observation. They also were hoping to go on a 2 week cruise which pt doesn't believe is a good idea.  Interval history  Discussed the use of AI scribe software for clinical note transcription with the patient, who gave verbal consent to proceed.  History of Present Illness     The patient, with a history of idiopathic thrombocytopenia and a heart condition, presents with a worsening of her thrombocytopenia. Patient is feeling about the same, may have felt tired the past couple days but not much worse. No bleeding complaints. Appetite is the same.  She had BMB on 10/29.  Rest of the pertinent 10 point ROS reviewed and negative  MEDICAL HISTORY:  Past Medical History:  Diagnosis Date   Anemia with low platelet count (HCC)    Chronic kidney disease    Degenerative joint disease    Dysrhythmia    a fib   GERD (gastroesophageal reflux disease)    History of kidney stones    Hyperlipidemia    Hypothyroidism    Inguinal hernia 02/19/2019   Labile hypertension    Migraines    neurontin helps   Pneumonia    hx of walking pneumonia    Primary hypertension 04/28/2022    SURGICAL HISTORY: Past Surgical History:  Procedure Laterality Date   BILATERAL CATARACT SURGERY      BLERPHOROPLASTY \     CHOLECYSTECTOMY  1996   open   COLONOSCOPY  2009   recommended 10 year f/u   HERNIA REPAIR  1985   umb hernia rpr   HERNIA REPAIR  2005 & 2009   ventral hernia rpr   INGUINAL HERNIA REPAIR Left 04/30/2020   Procedure: OPEN LEFT INGUINAL HERNIA REPAIR WITH MESH;  Surgeon: Berna Bue, MD;  Location: [facility];  Service: General;  Laterality: Left;   INGUINAL HERNIA REPAIR Right 10/24/2021   Procedure: OPEN RIGHT INGUINAL HERNIA REPAIR WITH MESH ;  Surgeon: Berna Bue, MD;  Location: [facility];  Service: General;  Laterality: Right;   PILONIDAL CYST EXCISION  1972   SKIN CANCER REMOVAL      TONSILLECTOMY     UPPER GI ENDOSCOPY     VENTRAL HERNIA REPAIR      SOCIAL HISTORY: Social History  Socioeconomic History   Marital status: Married    Spouse name: Not on file   Number of children: 2   Years of education: 57   Highest education level: Not on file  Occupational History   Not on file  Tobacco Use   Smoking status: Never   Smokeless tobacco: Never  Vaping Use   Vaping status: Never Used  Substance and Sexual Activity   Alcohol use: No   Drug use: No   Sexual activity: Yes  Other Topics Concern   Not on file  Social History Narrative   Not on file   Social Determinants of Health   Financial Resource  Strain: Not on file  Food Insecurity: No Food Insecurity (01/11/2023)   Hunger Vital Sign    Worried About Running Out of Food in the Last Year: Never true    Ran Out of Food in the Last Year: Never true  Transportation Needs: No Transportation Needs (01/11/2023)   PRAPARE - Administrator, Civil Service (Medical): No    Lack of Transportation (Non-Medical): No  Physical Activity: Not on file  Stress: Not on file  Social Connections: Not on file  Intimate Partner Violence: Not At Risk (01/11/2023)   Humiliation, Afraid, Rape, and Kick questionnaire    Fear of Current or Ex-Partner: No    Emotionally Abused: No    Physically Abused: No    Sexually Abused: No    FAMILY HISTORY: Family History  Problem Relation Age of Onset   Ovarian cancer Mother    Heart disease Mother    Cancer Mother    Uterine cancer Mother    Hypertension Mother    Breast cancer Sister    Stroke Paternal Uncle    Congestive Heart Failure Father    Gout Maternal Aunt    Colon cancer Neg Hx    Esophageal cancer Neg Hx    Stomach cancer Neg Hx    Rectal cancer Neg Hx     ALLERGIES:  has No Known Allergies.  MEDICATIONS:  Current Outpatient Medications  Medication Sig Dispense Refill   dexamethasone (DECADRON) 4 MG tablet Take 5 tablets (20 mg total) by mouth daily. 20 tablet 0   apixaban (ELIQUIS) 5 MG TABS tablet Take 1 tablet (5 mg total) by mouth 2 (two) times daily. 180 tablet 0   Ascorbic Acid (VITAMIN C) 500 MG CAPS Take 500 mg by mouth daily.      atorvastatin (LIPITOR) 80 MG tablet Take 1 tablet (80 mg total) by mouth daily. 30 tablet 3   BOTOX 100 units SOLR injection Inject 100 Units into the muscle See admin instructions. Inject 100 units intramuscularly every 6-8 weeks     cetirizine (ZYRTEC) 10 MG tablet Take 10 mg by mouth daily.     Cholecalciferol (VITAMIN D3) 125 MCG (5000 UT) TABS Take 5,000 Units by mouth daily.     divalproex (DEPAKOTE ER) 500 MG 24 hr tablet Take 1 tablet  (500 mg total) by mouth daily. 30 tablet 3   furosemide (LASIX) 20 MG tablet Take 1 tablet (20 mg total) by mouth daily as needed. (Patient taking differently: Take 20 mg by mouth daily as needed for fluid or edema.) 30 tablet 2   gabapentin (NEURONTIN) 300 MG capsule Take 300 mg (1 tablet) in the morning and 600 mg (2 tablets) at bedtime 90 capsule 3   levothyroxine (SYNTHROID) 50 MCG tablet Take  1 tablet  Daily  on an empty stomach with  only water for 30 minutes & no Antacid meds, Calcium or Magnesium for 4 hours & avoid Biotin                                                                                                                    /                                                                   TAKE                                         BY                                                 MOUTH (Patient taking differently: Take 50 mcg by mouth See admin instructions. Take 50 mcg by mouth in the morning on an empty stomach, with only water. Take no antacids, calcium, or magnesium for 4 hours and avoid biotin.) 90 tablet 3   Magnesium Oxide 400 MG CAPS Take 1 capsule (400 mg total) by mouth at bedtime.     metoprolol tartrate (LOPRESSOR) 50 MG tablet Take 1 tablet (50 mg total) by mouth 2 (two) times daily. 180 tablet 0   pantoprazole (PROTONIX) 40 MG tablet Take  1 tablet  2 x /day  for Acid Indigestion & Heartburn (Patient taking differently: Take 40 mg by mouth in the morning and at bedtime.) 180 tablet 3   potassium chloride SA (KLOR-CON M) 20 MEQ tablet Take 1 tablet (20 mEq total) by mouth daily. 90 tablet 0   Probiotic Product (PROBIOTIC PO) Take 1 capsule by mouth daily.     SYSTANE BALANCE 0.6 % SOLN Place 1 drop into both eyes at bedtime.     SYSTANE ULTRA 0.4-0.3 % SOLN Place 1 drop into both eyes in the morning.     TYLENOL 500 MG tablet Take 500-1,000 mg by mouth every 6 (six) hours as needed for mild pain or headache.     Zinc 30 MG TABS Take 30 mg by mouth daily.     No  current facility-administered medications for this visit.    REVIEW OF SYSTEMS:   Constitutional: ( - ) fevers, ( - )  chills , ( - ) night sweats Eyes: ( - ) blurriness of vision, ( - ) double vision, ( - ) watery eyes Ears, nose, mouth, throat, and face: ( - ) mucositis, ( - ) sore throat Respiratory: ( - ) cough, ( - ) dyspnea, ( - ) wheezes  Cardiovascular: ( - ) palpitation, ( - ) chest discomfort, ( - ) lower extremity swelling Gastrointestinal:  ( - ) nausea, ( - ) heartburn, ( - ) change in bowel habits Skin: ( - ) abnormal skin rashes Lymphatics: ( - ) new lymphadenopathy, ( - ) easy bruising Neurological: ( - ) numbness, ( - ) tingling, ( - ) new weaknesses Behavioral/Psych: ( - ) mood change, ( - ) new changes  All other systems were reviewed with the patient and are negative.  PHYSICAL EXAMINATION: ECOG PERFORMANCE STATUS: 0 - Asymptomatic  Vitals:   02/22/23 1051  BP: 102/65  Pulse: 64  Resp: 16  Temp: (!) 97.5 F (36.4 C)  SpO2: 96%   Filed Weights   02/22/23 1051  Weight: 156 lb 1.6 oz (70.8 kg)   PE deferred in lieu of counseling.   LABORATORY DATA:  I have reviewed the data as listed    Latest Ref Rng & Units 02/13/2023    7:55 AM 02/12/2023    2:58 PM 02/12/2023    2:56 PM  CBC  WBC 4.0 - 10.5 K/uL 6.3   6.0   Hemoglobin 12.0 - 15.0 g/dL 82.9   56.2   Hematocrit 36.0 - 46.0 % 34.3  36.1  35.9   Platelets 150 - 400 K/uL 33   34        Latest Ref Rng & Units 02/08/2023    2:30 PM 01/29/2023    3:46 PM 01/18/2023   12:19 PM  CMP  Glucose 70 - 99 mg/dL 84  83  61   BUN 8 - 23 mg/dL 22  26  29    Creatinine 0.44 - 1.00 mg/dL 1.30  8.65  7.84   Sodium 135 - 145 mmol/L 139  142  138   Potassium 3.5 - 5.1 mmol/L 4.8  4.6  4.7   Chloride 98 - 111 mmol/L 106  105  99   CO2 22 - 32 mmol/L 29  25  31    Calcium 8.9 - 10.3 mg/dL 9.9  9.9  69.6   Total Protein 6.5 - 8.1 g/dL 6.3   6.7   Total Bilirubin 0.3 - 1.2 mg/dL 0.5   0.6   Alkaline Phos 38 - 126  U/L 57     AST 15 - 41 U/L 17   19   ALT 0 - 44 U/L 12   16     ASSESSMENT & PLAN Jillian Rodgers is a 77 y.o. female who presents for a follow up for polycythemia and thrombocytopenia.  #Thrombocytopenia:  --Patient has undergone serologic workup on November 2021 without no evidence of hepatitis, hypothyroidism, nutritional deficiencies.  --MR of the abdomen from May 2021 shows no evidence of liver disease or splenomegaly.  --Bone marrow biopsy from 03/19/2020 showed hypercellularity, no evidence of dyspoeisis, megakaryocytic hyperplasia.  --Flow cytometry negative for any monoclonal B cell population. Cytogenetics was normal.  --Given her worsening thrombocytopenia, we repeated BMB. 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 -- We have discussed about trial of steroids, will try pulsed dosing of dexamethasone, dose reduced to half given the advanced age.  She is already on pantoprazole for GI protection.  She will return to clinic next week for a lab draw.  Follow-up with [place] in approximately 2 weeks.  Atrial Fibrillation The patient was previously on Eliquis for  stroke prevention, but this has been held due to low platelet count. -No blood thinners for now due to risk of bleeding with low platelet count. She understands increased risk of cardiovascular events without anticoagulation. -Will reassess need for anticoagulation once platelet count issue is better  Iron Deficiency Most recent ferritin is 28 at the lower limit of normal, no intravenous iron recommended at this time.  Rachel Moulds MD

## 2023-03-01 ENCOUNTER — Inpatient Hospital Stay: Payer: Medicare PPO

## 2023-03-01 DIAGNOSIS — D751 Secondary polycythemia: Secondary | ICD-10-CM | POA: Diagnosis not present

## 2023-03-01 DIAGNOSIS — Z803 Family history of malignant neoplasm of breast: Secondary | ICD-10-CM | POA: Diagnosis not present

## 2023-03-01 DIAGNOSIS — D696 Thrombocytopenia, unspecified: Secondary | ICD-10-CM

## 2023-03-01 DIAGNOSIS — D693 Immune thrombocytopenic purpura: Secondary | ICD-10-CM | POA: Diagnosis not present

## 2023-03-01 DIAGNOSIS — Z79899 Other long term (current) drug therapy: Secondary | ICD-10-CM | POA: Diagnosis not present

## 2023-03-01 DIAGNOSIS — D509 Iron deficiency anemia, unspecified: Secondary | ICD-10-CM

## 2023-03-01 DIAGNOSIS — Z808 Family history of malignant neoplasm of other organs or systems: Secondary | ICD-10-CM | POA: Diagnosis not present

## 2023-03-01 DIAGNOSIS — I4891 Unspecified atrial fibrillation: Secondary | ICD-10-CM | POA: Diagnosis not present

## 2023-03-01 DIAGNOSIS — E611 Iron deficiency: Secondary | ICD-10-CM | POA: Diagnosis not present

## 2023-03-01 LAB — IRON AND IRON BINDING CAPACITY (CC-WL,HP ONLY)
Iron: 60 ug/dL (ref 28–170)
Saturation Ratios: 18 % (ref 10.4–31.8)
TIBC: 340 ug/dL (ref 250–450)
UIBC: 280 ug/dL (ref 148–442)

## 2023-03-01 LAB — CBC WITH DIFFERENTIAL/PLATELET
Abs Immature Granulocytes: 0.19 10*3/uL — ABNORMAL HIGH (ref 0.00–0.07)
Basophils Absolute: 0.1 10*3/uL (ref 0.0–0.1)
Basophils Relative: 0 %
Eosinophils Absolute: 0.3 10*3/uL (ref 0.0–0.5)
Eosinophils Relative: 3 %
HCT: 38.2 % (ref 36.0–46.0)
Hemoglobin: 13 g/dL (ref 12.0–15.0)
Immature Granulocytes: 2 %
Lymphocytes Relative: 23 %
Lymphs Abs: 2.7 10*3/uL (ref 0.7–4.0)
MCH: 32.5 pg (ref 26.0–34.0)
MCHC: 34 g/dL (ref 30.0–36.0)
MCV: 95.5 fL (ref 80.0–100.0)
Monocytes Absolute: 1.2 10*3/uL — ABNORMAL HIGH (ref 0.1–1.0)
Monocytes Relative: 10 %
Neutro Abs: 7.4 10*3/uL (ref 1.7–7.7)
Neutrophils Relative %: 62 %
Platelets: 52 10*3/uL — ABNORMAL LOW (ref 150–400)
RBC: 4 MIL/uL (ref 3.87–5.11)
RDW: 12.8 % (ref 11.5–15.5)
WBC: 11.8 10*3/uL — ABNORMAL HIGH (ref 4.0–10.5)
nRBC: 0 % (ref 0.0–0.2)

## 2023-03-01 LAB — FERRITIN: Ferritin: 30 ng/mL (ref 11–307)

## 2023-03-05 ENCOUNTER — Encounter (HOSPITAL_COMMUNITY): Payer: Self-pay

## 2023-03-05 ENCOUNTER — Telehealth: Payer: Self-pay | Admitting: Hematology and Oncology

## 2023-03-05 NOTE — Telephone Encounter (Signed)
 Left patient a vm regarding upcoming appointment

## 2023-03-08 DIAGNOSIS — Z6827 Body mass index (BMI) 27.0-27.9, adult: Secondary | ICD-10-CM | POA: Diagnosis not present

## 2023-03-08 DIAGNOSIS — I129 Hypertensive chronic kidney disease with stage 1 through stage 4 chronic kidney disease, or unspecified chronic kidney disease: Secondary | ICD-10-CM | POA: Diagnosis not present

## 2023-03-08 DIAGNOSIS — N183 Chronic kidney disease, stage 3 unspecified: Secondary | ICD-10-CM | POA: Diagnosis not present

## 2023-03-08 DIAGNOSIS — N281 Cyst of kidney, acquired: Secondary | ICD-10-CM | POA: Diagnosis not present

## 2023-03-09 ENCOUNTER — Telehealth: Payer: Self-pay | Admitting: *Deleted

## 2023-03-09 ENCOUNTER — Other Ambulatory Visit (HOSPITAL_COMMUNITY): Payer: Self-pay

## 2023-03-09 ENCOUNTER — Inpatient Hospital Stay: Payer: Medicare PPO

## 2023-03-09 ENCOUNTER — Inpatient Hospital Stay: Payer: Medicare PPO | Admitting: Hematology and Oncology

## 2023-03-09 ENCOUNTER — Other Ambulatory Visit: Payer: Self-pay | Admitting: Hematology and Oncology

## 2023-03-09 VITALS — BP 97/64 | HR 65 | Temp 97.3°F | Resp 16 | Wt 157.3 lb

## 2023-03-09 DIAGNOSIS — D696 Thrombocytopenia, unspecified: Secondary | ICD-10-CM | POA: Diagnosis not present

## 2023-03-09 DIAGNOSIS — Z803 Family history of malignant neoplasm of breast: Secondary | ICD-10-CM | POA: Diagnosis not present

## 2023-03-09 DIAGNOSIS — D751 Secondary polycythemia: Secondary | ICD-10-CM | POA: Diagnosis not present

## 2023-03-09 DIAGNOSIS — Z79899 Other long term (current) drug therapy: Secondary | ICD-10-CM | POA: Diagnosis not present

## 2023-03-09 DIAGNOSIS — Z808 Family history of malignant neoplasm of other organs or systems: Secondary | ICD-10-CM | POA: Diagnosis not present

## 2023-03-09 DIAGNOSIS — D693 Immune thrombocytopenic purpura: Secondary | ICD-10-CM | POA: Diagnosis not present

## 2023-03-09 DIAGNOSIS — E611 Iron deficiency: Secondary | ICD-10-CM | POA: Diagnosis not present

## 2023-03-09 DIAGNOSIS — I4891 Unspecified atrial fibrillation: Secondary | ICD-10-CM | POA: Diagnosis not present

## 2023-03-09 LAB — CMP (CANCER CENTER ONLY)
ALT: 13 U/L (ref 0–44)
AST: 16 U/L (ref 15–41)
Albumin: 3.7 g/dL (ref 3.5–5.0)
Alkaline Phosphatase: 54 U/L (ref 38–126)
Anion gap: 4 — ABNORMAL LOW (ref 5–15)
BUN: 25 mg/dL — ABNORMAL HIGH (ref 8–23)
CO2: 31 mmol/L (ref 22–32)
Calcium: 10 mg/dL (ref 8.9–10.3)
Chloride: 105 mmol/L (ref 98–111)
Creatinine: 1.24 mg/dL — ABNORMAL HIGH (ref 0.44–1.00)
GFR, Estimated: 44 mL/min — ABNORMAL LOW (ref >60)
Glucose, Bld: 57 mg/dL — ABNORMAL LOW (ref 70–99)
Potassium: 4.2 mmol/L (ref 3.5–5.1)
Sodium: 140 mmol/L (ref 135–145)
Total Bilirubin: 0.6 mg/dL (ref <1.2)
Total Protein: 6.4 g/dL — ABNORMAL LOW (ref 6.5–8.1)

## 2023-03-09 LAB — CBC WITH DIFFERENTIAL/PLATELET
Abs Immature Granulocytes: 0.07 10*3/uL (ref 0.00–0.07)
Basophils Absolute: 0 10*3/uL (ref 0.0–0.1)
Basophils Relative: 0 %
Eosinophils Absolute: 0.1 10*3/uL (ref 0.0–0.5)
Eosinophils Relative: 1 %
HCT: 37.3 % (ref 36.0–46.0)
Hemoglobin: 12.2 g/dL (ref 12.0–15.0)
Immature Granulocytes: 1 %
Lymphocytes Relative: 25 %
Lymphs Abs: 2.1 10*3/uL (ref 0.7–4.0)
MCH: 31.3 pg (ref 26.0–34.0)
MCHC: 32.7 g/dL (ref 30.0–36.0)
MCV: 95.6 fL (ref 80.0–100.0)
Monocytes Absolute: 1 10*3/uL (ref 0.1–1.0)
Monocytes Relative: 12 %
Neutro Abs: 5.4 10*3/uL (ref 1.7–7.7)
Neutrophils Relative %: 61 %
Platelets: 34 10*3/uL — ABNORMAL LOW (ref 150–400)
RBC: 3.9 MIL/uL (ref 3.87–5.11)
RDW: 13 % (ref 11.5–15.5)
WBC: 8.8 10*3/uL (ref 4.0–10.5)
nRBC: 0 % (ref 0.0–0.2)

## 2023-03-09 LAB — LACTATE DEHYDROGENASE: LDH: 179 U/L (ref 98–192)

## 2023-03-09 NOTE — Telephone Encounter (Signed)
This RN left message for pt per MD regarding platelet reading back down with need to institute IVIG.  This RN's name given for return call.

## 2023-03-09 NOTE — Progress Notes (Signed)
[facility] [phone]   [phone]  PROGRESS NOTE  Patient Care Team: [name], MD as PCP - General (Internal Medicine) [name], MD as PCP - Cardiology (Cardiology) Rachel Moulds, MD as PCP - Hematology/Oncology (Hematology and Oncology) Philip Aspen, DO as PCP - OBGYN (Obstetrics and Gynecology) Annie Sable, [name]  CHIEF COMPLAINTS/PURPOSE OF CONSULTATION:  Thrombocytopenia  HISTORY OF PRESENTING ILLNESS:   Jillian Rodgers 77 y.o. female returns for a follow up for thrombocytopenia.   The patient, with a history of atrial fibrillation and transient ischemic attacks, presents with worsening thrombocytopenia.  Interval history  Discussed the use of AI scribe software for clinical note transcription with the patient, who gave verbal consent to proceed.  History of Present Illness    The patient, with a history of idiopathic thrombocytopenia and a heart condition, presents with a worsening of her thrombocytopenia.  We repeated a bone marrow recently, overall normocellular marrow with orderly trilineage hematopoiesis and megakaryocytic hyperplasia.  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  Cytogenetics showed normal female karyotype.  The patient recently completed a course of steroids, which seemed to improve the platelet count but led to a slump in energy levels after discontinuation. The fatigue varies day-to-day, with some days requiring afternoon naps. The patient's kidney function has shown improvement in recent tests, providing some relief amidst the health concerns. The patient's medication regimen includes metoprolol, which the patient's kidney doctor suggested halving due to potential side effects of tiredness. The patient is also on hold from taking Eliquis due to the low platelet  count.  Rest of the pertinent 10 point ROS reviewed and negative  MEDICAL HISTORY:  Past Medical History:  Diagnosis Date   Anemia with low platelet count (HCC)    Chronic kidney disease    Degenerative joint disease    Dysrhythmia    a fib   GERD (gastroesophageal reflux disease)    History of kidney stones    Hyperlipidemia    Hypothyroidism    Inguinal hernia 02/19/2019   Labile hypertension    Migraines    neurontin helps   Pneumonia    hx of walking pneumonia    Primary hypertension 04/28/2022    SURGICAL HISTORY: Past Surgical History:  Procedure Laterality Date   BILATERAL CATARACT SURGERY      BLERPHOROPLASTY \     CHOLECYSTECTOMY  1996   open   COLONOSCOPY  2009   recommended 10 year f/u   HERNIA REPAIR  1985   umb hernia rpr   HERNIA REPAIR  2005 & 2009   ventral hernia rpr   INGUINAL HERNIA REPAIR Left 04/30/2020   Procedure: OPEN LEFT INGUINAL HERNIA REPAIR WITH MESH;  Surgeon: Berna Bue, MD;  Location: [facility];  Service: General;  Laterality: Left;   INGUINAL HERNIA REPAIR Right 10/24/2021   Procedure: OPEN RIGHT INGUINAL HERNIA REPAIR WITH MESH ;  Surgeon: Berna Bue, MD;  Location: [facility];  Service: General;  Laterality: Right;   PILONIDAL CYST EXCISION  1972   SKIN CANCER REMOVAL      TONSILLECTOMY     UPPER GI ENDOSCOPY     VENTRAL HERNIA REPAIR      SOCIAL HISTORY: Social History   Socioeconomic History   Marital status: Married    Spouse name: Not on file   Number of children: 2   Years of education: 62  Highest education level: Not on file  Occupational History   Not on file  Tobacco Use   Smoking status: Never   Smokeless tobacco: Never  Vaping Use   Vaping status: Never Used  Substance and Sexual Activity   Alcohol use: No   Drug use: No   Sexual activity: Yes  Other Topics Concern   Not on file  Social History Narrative   Not on file   Social Determinants of Health   Financial Resource Strain: Not on file   Food Insecurity: No Food Insecurity (01/11/2023)   Hunger Vital Sign    Worried About Running Out of Food in the Last Year: Never true    Ran Out of Food in the Last Year: Never true  Transportation Needs: No Transportation Needs (01/11/2023)   PRAPARE - Administrator, Civil Service (Medical): No    Lack of Transportation (Non-Medical): No  Physical Activity: Not on file  Stress: Not on file  Social Connections: Not on file  Intimate Partner Violence: Not At Risk (01/11/2023)   Humiliation, Afraid, Rape, and Kick questionnaire    Fear of Current or Ex-Partner: No    Emotionally Abused: No    Physically Abused: No    Sexually Abused: No    FAMILY HISTORY: Family History  Problem Relation Age of Onset   Ovarian cancer Mother    Heart disease Mother    Cancer Mother    Uterine cancer Mother    Hypertension Mother    Breast cancer Sister    Stroke Paternal Uncle    Congestive Heart Failure Father    Gout Maternal Aunt    Colon cancer Neg Hx    Esophageal cancer Neg Hx    Stomach cancer Neg Hx    Rectal cancer Neg Hx     ALLERGIES:  has No Known Allergies.  MEDICATIONS:  Current Outpatient Medications  Medication Sig Dispense Refill   apixaban (ELIQUIS) 5 MG TABS tablet Take 1 tablet (5 mg total) by mouth 2 (two) times daily. 180 tablet 0   Ascorbic Acid (VITAMIN C) 500 MG CAPS Take 500 mg by mouth daily.      atorvastatin (LIPITOR) 80 MG tablet Take 1 tablet (80 mg total) by mouth daily. 30 tablet 3   BOTOX 100 units SOLR injection Inject 100 Units into the muscle See admin instructions. Inject 100 units intramuscularly every 6-8 weeks     cetirizine (ZYRTEC) 10 MG tablet Take 10 mg by mouth daily.     Cholecalciferol (VITAMIN D3) 125 MCG (5000 UT) TABS Take 5,000 Units by mouth daily.     dexamethasone (DECADRON) 4 MG tablet Take 5 tablets (20 mg total) by mouth daily. 20 tablet 0   divalproex (DEPAKOTE ER) 500 MG 24 hr tablet Take 1 tablet (500 mg total) by  mouth daily. 30 tablet 3   furosemide (LASIX) 20 MG tablet Take 1 tablet (20 mg total) by mouth daily as needed. (Patient taking differently: Take 20 mg by mouth daily as needed for fluid or edema.) 30 tablet 2   gabapentin (NEURONTIN) 300 MG capsule Take 300 mg (1 tablet) in the morning and 600 mg (2 tablets) at bedtime 90 capsule 3   levothyroxine (SYNTHROID) 50 MCG tablet Take  1 tablet  Daily  on an empty stomach with only water for 30 minutes & no Antacid meds, Calcium or Magnesium for 4 hours & avoid Biotin                                                                                                                    /  TAKE                                         BY                                                 MOUTH (Patient taking differently: Take 50 mcg by mouth See admin instructions. Take 50 mcg by mouth in the morning on an empty stomach, with only water. Take no antacids, calcium, or magnesium for 4 hours and avoid biotin.) 90 tablet 3   Magnesium Oxide 400 MG CAPS Take 1 capsule (400 mg total) by mouth at bedtime.     metoprolol tartrate (LOPRESSOR) 50 MG tablet Take 1 tablet (50 mg total) by mouth 2 (two) times daily. 180 tablet 0   pantoprazole (PROTONIX) 40 MG tablet Take  1 tablet  2 x /day  for Acid Indigestion & Heartburn (Patient taking differently: Take 40 mg by mouth in the morning and at bedtime.) 180 tablet 3   potassium chloride SA (KLOR-CON M) 20 MEQ tablet Take 1 tablet (20 mEq total) by mouth daily. 90 tablet 0   Probiotic Product (PROBIOTIC PO) Take 1 capsule by mouth daily.     SYSTANE BALANCE 0.6 % SOLN Place 1 drop into both eyes at bedtime.     SYSTANE ULTRA 0.4-0.3 % SOLN Place 1 drop into both eyes in the morning.     TYLENOL 500 MG tablet Take 500-1,000 mg by mouth every 6 (six) hours as needed for mild pain or headache.     Zinc 30 MG TABS Take 30 mg by mouth daily.     No current  facility-administered medications for this visit.    REVIEW OF SYSTEMS:   Constitutional: ( - ) fevers, ( - )  chills , ( - ) night sweats Eyes: ( - ) blurriness of vision, ( - ) double vision, ( - ) watery eyes Ears, nose, mouth, throat, and face: ( - ) mucositis, ( - ) sore throat Respiratory: ( - ) cough, ( - ) dyspnea, ( - ) wheezes Cardiovascular: ( - ) palpitation, ( - ) chest discomfort, ( - ) lower extremity swelling Gastrointestinal:  ( - ) nausea, ( - ) heartburn, ( - ) change in bowel habits Skin: ( - ) abnormal skin rashes Lymphatics: ( - ) new lymphadenopathy, ( - ) easy bruising Neurological: ( - ) numbness, ( - ) tingling, ( - ) new weaknesses Behavioral/Psych: ( - ) mood change, ( - ) new changes  All other systems were reviewed with the patient and are negative.  PHYSICAL EXAMINATION: ECOG PERFORMANCE STATUS: 0 - Asymptomatic  There were no vitals filed for this visit.  There were no vitals filed for this visit.  Physical Exam Constitutional:      Appearance: Normal appearance.  Cardiovascular:     Rate and Rhythm: Normal rate and regular rhythm.     Pulses: Normal pulses.     Heart sounds: Normal heart sounds.  Pulmonary:     Effort: Pulmonary effort is normal.     Breath sounds: Normal breath sounds.  Musculoskeletal:        General: Normal range of motion.     Cervical back: Normal range of  motion and neck supple. No rigidity.  Lymphadenopathy:     Cervical: No cervical adenopathy.  Skin:    General: Skin is warm and dry.  Neurological:     General: No focal deficit present.     Mental Status: She is alert.  Psychiatric:        Mood and Affect: Mood normal.   \   LABORATORY DATA:  I have reviewed the data as listed    Latest Ref Rng & Units 03/01/2023    1:09 PM 02/13/2023    7:55 AM 02/12/2023    2:58 PM  CBC  WBC 4.0 - 10.5 K/uL 11.8  6.3    Hemoglobin 12.0 - 15.0 g/dL 56.2  13.0    Hematocrit 36.0 - 46.0 % 38.2  34.3  36.1   Platelets  150 - 400 K/uL 52  33         Latest Ref Rng & Units 02/08/2023    2:30 PM 01/29/2023    3:46 PM 01/18/2023   12:19 PM  CMP  Glucose 70 - 99 mg/dL 84  83  61   BUN 8 - 23 mg/dL 22  26  29    Creatinine 0.44 - 1.00 mg/dL 8.65  7.84  6.96   Sodium 135 - 145 mmol/L 139  142  138   Potassium 3.5 - 5.1 mmol/L 4.8  4.6  4.7   Chloride 98 - 111 mmol/L 106  105  99   CO2 22 - 32 mmol/L 29  25  31    Calcium 8.9 - 10.3 mg/dL 9.9  9.9  29.5   Total Protein 6.5 - 8.1 g/dL 6.3   6.7   Total Bilirubin 0.3 - 1.2 mg/dL 0.5   0.6   Alkaline Phos 38 - 126 U/L 57     AST 15 - 41 U/L 17   19   ALT 0 - 44 U/L 12   16     ASSESSMENT & PLAN Jillian Rodgers is a 77 y.o. female who presents for a follow up for polycythemia and thrombocytopenia.  #Thrombocytopenia:  --Patient has undergone serologic workup on November 2021 without no evidence of hepatitis, hypothyroidism, nutritional deficiencies.  --MR of the abdomen from May 2021 shows no evidence of liver disease or splenomegaly.  --Bone marrow biopsy from 03/19/2020 showed hypercellularity, no evidence of dyspoeisis, megakaryocytic hyperplasia.  --Flow cytometry negative for any monoclonal B cell population. Cytogenetics was normal.  --Given her worsening thrombocytopenia, we repeated BMB. 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 -- We have discussed about trial of steroids, plts improved to 52 K, will repeat labs today. -- Patient experienced fatigue after discontinuing steroids. No bleeding or headaches reported. -Draw blood today to check current platelet count. -If platelet count is improved, patient to return in 2 weeks for lab draw only. -If platelet count is not improved, consider alternative treatments such as intravenous  immunoglobulin or rituximab.  Atrial Fibrillation The patient was previously on Eliquis for stroke prevention, but this has been held due to low platelet count. -No blood thinners for now due to risk of bleeding with low platelet count. She understands increased risk of cardiovascular events without anticoagulation. -Will reassess need for anticoagulation once platelet count issue is better  Iron Deficiency Most recent ferritin is 28 at the lower limit of normal, no intravenous iron recommended at  this time. She can consider oral iron every other day, OTC  Hypertension Patient reported fatigue, possibly related to metoprolol. Nephrologist suggested reducing metoprolol dose due to low blood pressure. -Reduce metoprolol dose by half as suggested by nephrologist.  General Health Maintenance -Encourage patient to maintain physical activity, such as walking.  Rachel Moulds MD

## 2023-03-09 NOTE — Progress Notes (Unsigned)
We will proceed with IVIG,   Jillian Rodgers

## 2023-03-12 ENCOUNTER — Other Ambulatory Visit: Payer: Self-pay

## 2023-03-19 ENCOUNTER — Inpatient Hospital Stay: Payer: Medicare PPO | Attending: Hematology and Oncology

## 2023-03-19 VITALS — BP 178/102 | HR 58 | Temp 98.0°F | Resp 18

## 2023-03-19 DIAGNOSIS — D509 Iron deficiency anemia, unspecified: Secondary | ICD-10-CM

## 2023-03-19 DIAGNOSIS — D696 Thrombocytopenia, unspecified: Secondary | ICD-10-CM | POA: Insufficient documentation

## 2023-03-19 DIAGNOSIS — D693 Immune thrombocytopenic purpura: Secondary | ICD-10-CM | POA: Diagnosis not present

## 2023-03-19 MED ORDER — IMMUNE GLOBULIN (HUMAN) 10 GM/100ML IJ SOLN
1.0000 g/kg | Freq: Once | INTRAMUSCULAR | Status: AC
  Administered 2023-03-19: 70 g via INTRAVENOUS
  Filled 2023-03-19: qty 200

## 2023-03-19 MED ORDER — DEXTROSE 5 % IV SOLN: INTRAVENOUS | Status: DC

## 2023-03-19 MED ORDER — ACETAMINOPHEN 325 MG PO TABS
650.0000 mg | ORAL_TABLET | Freq: Once | ORAL | Status: AC
  Administered 2023-03-19: 650 mg via ORAL
  Filled 2023-03-19: qty 2

## 2023-03-19 MED ORDER — DIPHENHYDRAMINE HCL 25 MG PO CAPS
25.0000 mg | ORAL_CAPSULE | Freq: Once | ORAL | Status: AC
  Administered 2023-03-19: 25 mg via ORAL
  Filled 2023-03-19: qty 1

## 2023-03-19 NOTE — Patient Instructions (Signed)

## 2023-03-19 NOTE — Progress Notes (Signed)
1230-Dr. Iruku notified of manual BP of 178/102 and that pt is asymptomatic at completion of IVIG. OK to discharge pt to home since pt is asymptomatic per order of Dr. Al Pimple.  Pt remains without complaints at time of discharge.

## 2023-03-22 ENCOUNTER — Inpatient Hospital Stay: Payer: Medicare PPO

## 2023-03-22 VITALS — BP 157/86 | HR 62 | Temp 97.9°F | Resp 20

## 2023-03-22 DIAGNOSIS — D509 Iron deficiency anemia, unspecified: Secondary | ICD-10-CM

## 2023-03-22 DIAGNOSIS — D693 Immune thrombocytopenic purpura: Secondary | ICD-10-CM | POA: Diagnosis not present

## 2023-03-22 MED ORDER — IMMUNE GLOBULIN (HUMAN) 10 GM/100ML IJ SOLN
1.0000 g/kg | Freq: Once | INTRAMUSCULAR | Status: AC
  Administered 2023-03-22: 40 g via INTRAVENOUS
  Filled 2023-03-22: qty 700

## 2023-03-22 MED ORDER — SODIUM CHLORIDE 0.9% FLUSH
10.0000 mL | Freq: Two times a day (BID) | INTRAVENOUS | Status: DC
  Administered 2023-03-22: 10 mL via INTRAVENOUS

## 2023-03-22 MED ORDER — DIPHENHYDRAMINE HCL 25 MG PO CAPS
25.0000 mg | ORAL_CAPSULE | Freq: Once | ORAL | Status: AC
Start: 2023-03-22 — End: 2023-03-22
  Administered 2023-03-22: 25 mg via ORAL
  Filled 2023-03-22: qty 1

## 2023-03-22 MED ORDER — ACETAMINOPHEN 325 MG PO TABS
650.0000 mg | ORAL_TABLET | Freq: Once | ORAL | Status: AC
  Administered 2023-03-22: 650 mg via ORAL
  Filled 2023-03-22: qty 2

## 2023-03-22 MED ORDER — SODIUM CHLORIDE 0.9 % IV SOLN
Freq: Once | INTRAVENOUS | Status: DC
Start: 2023-03-22 — End: 2023-03-22

## 2023-03-22 NOTE — Patient Instructions (Signed)

## 2023-03-23 ENCOUNTER — Inpatient Hospital Stay: Payer: Medicare PPO

## 2023-03-23 ENCOUNTER — Other Ambulatory Visit: Payer: Self-pay

## 2023-03-23 ENCOUNTER — Emergency Department (HOSPITAL_COMMUNITY): Payer: Medicare PPO

## 2023-03-23 ENCOUNTER — Observation Stay (HOSPITAL_COMMUNITY)
Admission: EM | Admit: 2023-03-23 | Discharge: 2023-03-25 | Disposition: A | Discharge status: Home / Self Care | Payer: Medicare PPO | Attending: Internal Medicine | Admitting: Internal Medicine

## 2023-03-23 ENCOUNTER — Inpatient Hospital Stay: Payer: Medicare PPO | Admitting: Adult Health

## 2023-03-23 ENCOUNTER — Encounter (HOSPITAL_COMMUNITY): Payer: Self-pay

## 2023-03-23 VITALS — BP 132/77 | HR 64 | Temp 97.3°F | Resp 17 | Wt 160.6 lb

## 2023-03-23 DIAGNOSIS — I517 Cardiomegaly: Secondary | ICD-10-CM | POA: Diagnosis not present

## 2023-03-23 DIAGNOSIS — D696 Thrombocytopenia, unspecified: Secondary | ICD-10-CM | POA: Diagnosis present

## 2023-03-23 DIAGNOSIS — N1832 Chronic kidney disease, stage 3b: Secondary | ICD-10-CM | POA: Diagnosis not present

## 2023-03-23 DIAGNOSIS — I480 Paroxysmal atrial fibrillation: Principal | ICD-10-CM | POA: Insufficient documentation

## 2023-03-23 DIAGNOSIS — N1831 Chronic kidney disease, stage 3a: Secondary | ICD-10-CM | POA: Diagnosis present

## 2023-03-23 DIAGNOSIS — R000 Tachycardia, unspecified: Secondary | ICD-10-CM | POA: Diagnosis not present

## 2023-03-23 DIAGNOSIS — I4891 Unspecified atrial fibrillation: Secondary | ICD-10-CM | POA: Diagnosis not present

## 2023-03-23 DIAGNOSIS — I129 Hypertensive chronic kidney disease with stage 1 through stage 4 chronic kidney disease, or unspecified chronic kidney disease: Secondary | ICD-10-CM | POA: Insufficient documentation

## 2023-03-23 DIAGNOSIS — D693 Immune thrombocytopenic purpura: Secondary | ICD-10-CM | POA: Diagnosis present

## 2023-03-23 DIAGNOSIS — I10 Essential (primary) hypertension: Secondary | ICD-10-CM | POA: Diagnosis present

## 2023-03-23 DIAGNOSIS — E785 Hyperlipidemia, unspecified: Secondary | ICD-10-CM | POA: Diagnosis present

## 2023-03-23 DIAGNOSIS — D649 Anemia, unspecified: Secondary | ICD-10-CM | POA: Diagnosis not present

## 2023-03-23 DIAGNOSIS — E039 Hypothyroidism, unspecified: Secondary | ICD-10-CM | POA: Diagnosis not present

## 2023-03-23 DIAGNOSIS — E871 Hypo-osmolality and hyponatremia: Secondary | ICD-10-CM | POA: Diagnosis not present

## 2023-03-23 DIAGNOSIS — Z79899 Other long term (current) drug therapy: Secondary | ICD-10-CM | POA: Diagnosis not present

## 2023-03-23 DIAGNOSIS — Z85828 Personal history of other malignant neoplasm of skin: Secondary | ICD-10-CM | POA: Insufficient documentation

## 2023-03-23 DIAGNOSIS — R0602 Shortness of breath: Secondary | ICD-10-CM | POA: Diagnosis not present

## 2023-03-23 LAB — CBC WITH DIFFERENTIAL/PLATELET
Abs Immature Granulocytes: 0.03 10*3/uL (ref 0.00–0.07)
Basophils Absolute: 0 10*3/uL (ref 0.0–0.1)
Basophils Relative: 0 %
Eosinophils Absolute: 0.1 10*3/uL (ref 0.0–0.5)
Eosinophils Relative: 1 %
HCT: 33.5 % — ABNORMAL LOW (ref 36.0–46.0)
Hemoglobin: 11.1 g/dL — ABNORMAL LOW (ref 12.0–15.0)
Immature Granulocytes: 1 %
Lymphocytes Relative: 26 %
Lymphs Abs: 1.2 10*3/uL (ref 0.7–4.0)
MCH: 32.2 pg (ref 26.0–34.0)
MCHC: 33.1 g/dL (ref 30.0–36.0)
MCV: 97.1 fL (ref 80.0–100.0)
Monocytes Absolute: 0.8 10*3/uL (ref 0.1–1.0)
Monocytes Relative: 17 %
Neutro Abs: 2.4 10*3/uL (ref 1.7–7.7)
Neutrophils Relative %: 55 %
Platelets: 130 10*3/uL — ABNORMAL LOW (ref 150–400)
RBC: 3.45 MIL/uL — ABNORMAL LOW (ref 3.87–5.11)
RDW: 13.2 % (ref 11.5–15.5)
WBC: 4.5 10*3/uL (ref 4.0–10.5)
nRBC: 0 % (ref 0.0–0.2)

## 2023-03-23 LAB — BASIC METABOLIC PANEL
Anion gap: 8 (ref 5–15)
BUN: 32 mg/dL — ABNORMAL HIGH (ref 8–23)
CO2: 24 mmol/L (ref 22–32)
Calcium: 9.4 mg/dL (ref 8.9–10.3)
Chloride: 101 mmol/L (ref 98–111)
Creatinine, Ser: 1.1 mg/dL — ABNORMAL HIGH (ref 0.44–1.00)
GFR, Estimated: 51 mL/min — ABNORMAL LOW (ref >60)
Glucose, Bld: 115 mg/dL — ABNORMAL HIGH (ref 70–99)
Potassium: 3.4 mmol/L — ABNORMAL LOW (ref 3.5–5.1)
Sodium: 133 mmol/L — ABNORMAL LOW (ref 135–145)

## 2023-03-23 LAB — CBC
HCT: 31.9 % — ABNORMAL LOW (ref 36.0–46.0)
Hemoglobin: 10.8 g/dL — ABNORMAL LOW (ref 12.0–15.0)
MCH: 32.6 pg (ref 26.0–34.0)
MCHC: 33.9 g/dL (ref 30.0–36.0)
MCV: 96.4 fL (ref 80.0–100.0)
Platelets: 155 10*3/uL (ref 150–400)
RBC: 3.31 MIL/uL — ABNORMAL LOW (ref 3.87–5.11)
RDW: 13.6 % (ref 11.5–15.5)
WBC: 5.3 10*3/uL (ref 4.0–10.5)
nRBC: 0 % (ref 0.0–0.2)

## 2023-03-23 LAB — MAGNESIUM: Magnesium: 2.2 mg/dL (ref 1.7–2.4)

## 2023-03-23 LAB — TSH: TSH: 3.248 u[IU]/mL (ref 0.350–4.500)

## 2023-03-23 MED ORDER — HYDRALAZINE HCL 25 MG PO TABS
25.0000 mg | ORAL_TABLET | Freq: Once | ORAL | Status: AC
  Administered 2023-03-23: 25 mg via ORAL
  Filled 2023-03-23: qty 1

## 2023-03-23 MED ORDER — DILTIAZEM LOAD VIA INFUSION
20.0000 mg | Freq: Once | INTRAVENOUS | Status: AC
  Administered 2023-03-23: 20 mg via INTRAVENOUS
  Filled 2023-03-23: qty 20

## 2023-03-23 MED ORDER — METOPROLOL TARTRATE 25 MG PO TABS
25.0000 mg | ORAL_TABLET | Freq: Two times a day (BID) | ORAL | Status: DC
  Administered 2023-03-23 – 2023-03-25 (×4): 25 mg via ORAL
  Filled 2023-03-23 (×4): qty 1

## 2023-03-23 MED ORDER — SODIUM CHLORIDE 0.9 % IV BOLUS
500.0000 mL | Freq: Once | INTRAVENOUS | Status: AC
  Administered 2023-03-23: 500 mL via INTRAVENOUS

## 2023-03-23 MED ORDER — HEPARIN BOLUS VIA INFUSION
3500.0000 [IU] | Freq: Once | INTRAVENOUS | Status: AC
Start: 2023-03-23 — End: 2023-03-23
  Administered 2023-03-23: 3500 [IU] via INTRAVENOUS
  Filled 2023-03-23: qty 3500

## 2023-03-23 MED ORDER — HEPARIN (PORCINE) 25000 UT/250ML-% IV SOLN
800.0000 [IU]/h | INTRAVENOUS | Status: AC
  Administered 2023-03-24: 800 [IU]/h via INTRAVENOUS
  Filled 2023-03-23: qty 250

## 2023-03-23 MED ORDER — DILTIAZEM HCL 25 MG/5ML IV SOLN
INTRAVENOUS | Status: AC
  Filled 2023-03-23: qty 5

## 2023-03-23 MED ORDER — DILTIAZEM HCL-DEXTROSE 125-5 MG/125ML-% IV SOLN (PREMIX)
5.0000 mg/h | INTRAVENOUS | Status: AC
  Administered 2023-03-23: 12.5 mg/h via INTRAVENOUS
  Administered 2023-03-23: 5 mg/h via INTRAVENOUS
  Administered 2023-03-23: 7.5 mg/h via INTRAVENOUS
  Administered 2023-03-24: 12.5 mg/h via INTRAVENOUS
  Filled 2023-03-23 (×2): qty 125

## 2023-03-23 NOTE — Assessment & Plan Note (Signed)
Creat 1.1 today, baseline.

## 2023-03-23 NOTE — Progress Notes (Signed)
PHARMACY - ANTICOAGULATION CONSULT NOTE  Pharmacy Consult for Heparin Indication: atrial fibrillation  No Known Allergies  Patient Measurements: Height: 5' 2.5" (158.8 cm) Weight: 72.8 kg (160 lb 7.9 oz) IBW/kg (Calculated) : 51.25 HEPARIN DW (KG): 66.7   Vital Signs: Temp: 98 F (36.7 C) (12/06 1942) Temp Source: Oral (12/06 1942) BP: 182/118 (12/06 2145) Pulse Rate: 109 (12/06 2145)  Labs: Recent Labs    03/23/23 1104 03/23/23 1955  HGB 11.1* 10.8*  HCT 33.5* 31.9*  PLT 130* 155  CREATININE  --  1.10*    Estimated Creatinine Clearance: 39.2 mL/min (A) (by C-G formula based on SCr of 1.1 mg/dL (H)).   Medical History: Past Medical History:  Diagnosis Date   Anemia with low platelet count (HCC)    Chronic kidney disease    Degenerative joint disease    Dysrhythmia    a fib   GERD (gastroesophageal reflux disease)    History of kidney stones    Hyperlipidemia    Hypothyroidism    Inguinal hernia 02/19/2019   Labile hypertension    Migraines    neurontin helps   Pneumonia    hx of walking pneumonia    Primary hypertension 04/28/2022    Medications:  Infusions:   diltiazem (CARDIZEM) infusion 7.5 mg/hr (03/23/23 2144)    Assessment: Jillian Rodgers with Afib.  CHADS2Vasc score =4.  Not previously placed on anticoagulation due to hx thrombocytopenia.   Baseline CBC- Hg 10.8- low/stable, pltc 155- low end of normal, Scr 1.1  Goal of Therapy:  Heparin level 0.3-0.7 units/ml Monitor platelets by anticoagulation protocol: Yes   Plan:  Give 3500 units bolus x 1 Start heparin infusion at 800 units/hr Check anti-Xa level in 8 hours and daily while on heparin Continue to monitor H&H and platelets  Junita Push PharmD 03/23/2023,10:11 PM

## 2023-03-23 NOTE — Assessment & Plan Note (Signed)
Cont home lopressor Now also on cardizem gtt

## 2023-03-23 NOTE — Assessment & Plan Note (Signed)
Cardizem gtt for rate control Pt had been planning on pursuing [facility] for stroke prevention given chronic thrombocytopenia + recurrent iron def anemia. However, today pt with normal / near normal platelets today for first time in 7 years (thanks to last weeks IVIG treatment I suspect): Heparin gtt for stroke prevention overnight Day team will need to decide wether to start her on eliquis before discharge or not Sent a in basket message to Dr. Al Pimple with an update as well Tele monitor Daily CBC

## 2023-03-23 NOTE — ED Provider Notes (Signed)
[facility] Provider Note   CSN: [id number] Arrival date & time: 03/23/23  1937     History  Chief Complaint  Patient presents with   Tachycardia    Jillian Rodgers is a 77 y.o. female.  HPI   Patient has a history of atrial fibrillation acid reflux hypothyroidism anemia, hypertension, chronic kidney disease.  Patient states she has had issues with recurrent atrial fibrillation.  She is not on any anticoagulation though because she has a history of iron deficiency anemia and thrombocytopenia.  Patient has been in and out of atrial fibrillation a couple of times.  Patient states this episode started this evening.  She noticed her heart racing and she felt flushed.  She denies any chest pain.  No shortness of breath. According to the records patient is supposed to have a consultation with Dr. Gaynelle Arabian later this month regarding Watchman procedure  Home Medications Prior to Admission medications   Medication Sig Start Date End Date Taking? Authorizing Provider  Ascorbic Acid (VITAMIN C) 500 MG CAPS Take 500 mg by mouth daily.     [provider]  atorvastatin (LIPITOR) 80 MG tablet Take 1 tablet (80 mg total) by mouth daily. 02/19/23   Adela Glimpse, NP  BOTOX 100 units SOLR injection Inject 100 Units into the muscle See admin instructions. Inject 100 units intramuscularly every 6-8 weeks 10/21/20   [provider]  cetirizine (ZYRTEC) 10 MG tablet Take 10 mg by mouth daily.    [provider]  Cholecalciferol (VITAMIN D3) 125 MCG (5000 UT) TABS Take 5,000 Units by mouth daily.    [provider]  dexamethasone (DECADRON) 4 MG tablet Take 5 tablets (20 mg total) by mouth daily. 02/22/23   Rachel Moulds, MD  divalproex (DEPAKOTE ER) 500 MG 24 hr tablet Take 1 tablet (500 mg total) by mouth daily. 02/19/23   Adela Glimpse, NP  furosemide (LASIX) 20 MG tablet Take 1 tablet (20 mg total) by mouth daily as  needed. Patient taking differently: Take 20 mg by mouth daily as needed for fluid or edema. 01/03/23   Adela Glimpse, NP  gabapentin (NEURONTIN) 300 MG capsule Take 300 mg (1 tablet) in the morning and 600 mg (2 tablets) at bedtime 02/19/23   Adela Glimpse, NP  levothyroxine (SYNTHROID) 50 MCG tablet Take  1 tablet  Daily  on an empty stomach with only water for 30 minutes & no Antacid meds, Calcium or Magnesium for 4 hours & avoid Biotin                                                                                                                    /  TAKE                                         BY                                                 MOUTH Patient taking differently: Take 50 mcg by mouth See admin instructions. Take 50 mcg by mouth in the morning on an empty stomach, with only water. Take no antacids, calcium, or magnesium for 4 hours and avoid biotin. 11/15/22   [name], MD  Magnesium Oxide 400 MG CAPS Take 1 capsule (400 mg total) by mouth at bedtime. 01/13/23   Carollee Herter, DO  metoprolol tartrate (LOPRESSOR) 50 MG tablet Take 1 tablet (50 mg total) by mouth 2 (two) times daily. Patient taking differently: Take 25 mg by mouth 2 (two) times daily. 01/13/23 04/13/23  Carollee Herter, DO  pantoprazole (PROTONIX) 40 MG tablet Take  1 tablet  2 x /day  for Acid Indigestion & Heartburn Patient taking differently: Take 40 mg by mouth in the morning and at bedtime. 02/01/22   [name], MD  SYSTANE BALANCE 0.6 % SOLN Place 1 drop into both eyes at bedtime.    [provider]  SYSTANE ULTRA 0.4-0.3 % SOLN Place 1 drop into both eyes in the morning.    [provider]  TYLENOL 500 MG tablet Take 500-1,000 mg by mouth every 6 (six) hours as needed for mild pain or headache.    [provider]  Zinc 30 MG TABS Take 30 mg by mouth daily.    [provider]      Allergies    Patient has  no known allergies.    Review of Systems   Review of Systems  Physical Exam Updated Vital Signs BP (!) 182/118   Pulse (!) 109   Temp 98 F (36.7 C) (Oral)   Resp 17   Ht 1.588 m (5' 2.5")   Wt 72.8 kg   SpO2 91%   BMI 28.89 kg/m  Physical Exam Vitals and nursing note reviewed.  Constitutional:      Appearance: She is well-developed. She is not diaphoretic.  HENT:     Head: Normocephalic and atraumatic.     Right Ear: External ear normal.     Left Ear: External ear normal.  Eyes:     General: No scleral icterus.       Right eye: No discharge.        Left eye: No discharge.     Conjunctiva/sclera: Conjunctivae normal.  Neck:     Trachea: No tracheal deviation.  Cardiovascular:     Rate and Rhythm: Tachycardia present. Rhythm irregular.  Pulmonary:     Effort: Pulmonary effort is normal. No respiratory distress.     Breath sounds: Normal breath sounds. No stridor. No wheezing or rales.  Abdominal:     General: Bowel sounds are normal. There is no distension.     Palpations: Abdomen is soft.     Tenderness: There is no abdominal tenderness. There is no guarding or rebound.  Musculoskeletal:        General: No tenderness or deformity.     Cervical back: Neck supple.  Skin:    General:  Skin is warm and dry.     Findings: No rash.  Neurological:     General: No focal deficit present.     Mental Status: She is alert.     Cranial Nerves: No cranial nerve deficit, dysarthria or facial asymmetry.     Sensory: No sensory deficit.     Motor: No abnormal muscle tone or seizure activity.     Coordination: Coordination normal.  Psychiatric:        Mood and Affect: Mood normal.     ED Results / Procedures / Treatments   Labs (all labs ordered are listed, but only abnormal results are displayed) Labs Reviewed  BASIC METABOLIC PANEL - Abnormal; Notable for the following components:      Result Value   Sodium 133 (*)    Potassium 3.4 (*)    Glucose, Bld 115 (*)    BUN  32 (*)    Creatinine, Ser 1.10 (*)    GFR, Estimated 51 (*)    All other components within normal limits  CBC - Abnormal; Notable for the following components:   RBC 3.31 (*)    Hemoglobin 10.8 (*)    HCT 31.9 (*)    All other components within normal limits  MAGNESIUM  CBC  TSH    EKG EKG Interpretation Date/Time:  Friday March 23 2023 19:53:30 EST Ventricular Rate:  153 PR Interval:    QRS Duration:  88 QT Interval:  299 QTC Calculation: 477 R Axis:   10  Text Interpretation: Atrial fibrillation with rapid V-rate Paired ventricular premature complexes Low voltage, precordial leads ST depression, probably rate related Confirmed by Linwood Dibbles [phone]) on 03/23/2023 7:58:47 PM  Radiology DG Chest Port 1 View  Result Date: 03/23/2023 CLINICAL DATA:  AFib, tachycardia.  Shortness of breath. EXAM: PORTABLE CHEST 1 VIEW COMPARISON:  01/09/2023. FINDINGS: The heart is enlarged and the mediastinal contour is stable. There is atherosclerotic calcification of the aorta. A moderate-to-large hiatal hernia is present. No consolidation, effusion, or pneumothorax. No acute osseous abnormality. IMPRESSION: 1. No active disease. 2. Moderate-to-large hiatal hernia. 3. Cardiomegaly. Electronically Signed   By: Thornell Sartorius M.D.   On: 03/23/2023 20:22    Procedures .Critical Care  Performed by: Linwood Dibbles, MD Authorized by: Linwood Dibbles, MD   Critical care provider statement:    Critical care time (minutes):  35   Critical care was time spent personally by me on the following activities:  Development of treatment plan with patient or surrogate, discussions with consultants, evaluation of patient's response to treatment, examination of patient, ordering and review of laboratory studies, ordering and review of radiographic studies, ordering and performing treatments and interventions, pulse oximetry, re-evaluation of patient's condition and review of old charts     Medications Ordered in  ED Medications  diltiazem (CARDIZEM) 1 mg/mL load via infusion 20 mg (20 mg Intravenous Bolus from Bag 03/23/23 1957)    And  diltiazem (CARDIZEM) 125 mg in dextrose 5% 125 mL (1 mg/mL) infusion (7.5 mg/hr Intravenous New Bag/Given 03/23/23 2144)  metoprolol tartrate (LOPRESSOR) tablet 25 mg (has no administration in time range)  sodium chloride 0.9 % bolus 500 mL (0 mLs Intravenous Stopped 03/23/23 2115)    ED Course/ Medical Decision Making/ A&P Clinical Course as of 03/23/23 2207  Fri Mar 23, 2023  2010 Heart rate has improved now down into the 90s.  Blood pressure stable in the 120s [JK]  2134 CBC shows hemoglobin of 10.8, similar to previous.  Metabolic panel shows stable creatinine [JK]  2134 DG Chest Port 1 View Chest x-ray shows hiatal hernia and cardiomegaly but no acute findings [JK]  2142 Heart rate back in the 110s 115's.  Will continue the Cardizem infusion [JK]  2207 Case discussed with Dr Julian Reil regarding admission [JK]    Clinical Course User Index [JK] Linwood Dibbles, MD         CHA2DS2-VASc Score: 4                        Medical Decision Making Tachydysrhythmia differential diagnosis concerning for possible SVT, atrial fibrillation rapid ventricular response, ventricular tachycardia  Problems Addressed: Atrial fibrillation with RVR (HCC): acute illness or injury that poses a threat to life or bodily functions  Amount and/or Complexity of Data Reviewed Labs: ordered. Decision-making details documented in ED Course. Radiology: ordered and independent interpretation performed. Decision-making details documented in ED Course.  Risk Prescription drug management.   Patient presented to the ED for evaluation of tachycardia.  Patient has history of atrial fibrillation.  EKG in the ED consistent with atrial fibrillation.  Patient is tachycardic in the 170s.  She responded to IV Cardizem infusion.  Patient without signs of severe anemia or electrolyte abnormalities.  Chest  x-ray without pneumonia or pulmonary edema.  Patient has improved with IV Cardizem.  However she continues to remain in atrial fibrillation.  She is not on anticoagulation so we will hold off on cardioversion.  Patient has previously not been placed on anticoagulation despite her elevated CHA2DS2-VASc score due to her thrombocytopenia.  I will consult the medical service for admission and further treatment       Final Clinical Impression(s) / ED Diagnoses Final diagnoses:  Atrial fibrillation with RVR [facility])    Rx / DC Orders ED Discharge Orders          Ordered    Amb referral to AFIB Clinic        03/23/23 1955              Linwood Dibbles, MD 03/23/23 2208

## 2023-03-23 NOTE — ED Triage Notes (Signed)
Pt reports tachycardia starting at 1820, HR as high as 177 at home. Pt has hx of same. Pt reports she feels sob. Hr 173 in triage

## 2023-03-23 NOTE — Assessment & Plan Note (Signed)
Platelets of 155 today are much higher than her baseline thrombocytopenia (usually runs between 30 and 60). 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 Daily CBC while here See discussion above regarding AC

## 2023-03-23 NOTE — Assessment & Plan Note (Signed)
Check TSH Cont home synthroid

## 2023-03-23 NOTE — Progress Notes (Unsigned)
[facility] Cancer Follow up:    Jillian Cowboy, MD [address] [place] [address]   DIAGNOSIS: thrombocytopenia  SUMMARY OF HEMATOLOGIC HISTORY:  Underwent thrombocytopenia lab evaluation inn November 2021 without no evidence of hepatitis, hypothyroidism, nutritional deficiencies.  MR of the abdomen from May 2021 shows no evidence of liver disease or splenomegaly.  Bone marrow biopsy from 03/19/2020 showed hypercellularity, no evidence of dyspoeisis, megakaryocytic hyperplasia. Flow cytometry negative for any monoclonal B cell population. Cytogenetics were normal.  Bone marrow biopsy 02/13/2023, overall normocellular marrow with orderly trilineage hematopoiesis and megakaryocytic hyperplasia. 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 Cytogenetics showed normal female karyotype.  Steroids 02/22/2023 which seemed to improve the platelet count but led to a slump in energy levels after discontinuation-minimal improvement in platelet count IVIG 03/19/2023 and 03/22/2023  CURRENT THERAPY: IVIG given earlier this week  INTERVAL HISTORY:  Jillian Rodgers 77 y.o. female returns for follow-up after receiving IVIG earlier this week.  She has not undergone repeat platelet check.  She tells me that other than having to sit and receive the infusion she tolerated the treatment quite well.   Patient Active Problem List   Diagnosis Date Noted  . Hypokalemia 01/11/2023  . Acute cystitis without hematuria 01/10/2023  . History of TIA (transient ischemic attack) 01/10/2023  . Atrial fibrillation with RVR (HCC) 01/09/2023  . TIA (transient ischemic attack) 10/17/2022  . Primary hypertension 04/28/2022  . Snoring 03/04/2021  . Paroxysmal atrial fibrillation (HCC) 11/01/2020  . Secondary hypercoagulable state (HCC) 11/01/2020  . Increased urinary frequency 10/20/2020  . Localized  osteoporosis without current pathological fracture 10/20/2020  . IDA (iron deficiency anemia) 04/07/2020  . Thrombocytopenia (HCC) 11/25/2015  . Encounter for Medicare annual wellness exam 06/14/2015  . Overweight (BMI 25.0-29.9) 01/28/2015  . CKD stage 3a, GFR 45-59 ml/min (HCC) - baseline SCr 1.1-1.4 01/19/2014  . Upper airway cough syndrome 12/16/2013  . Vitamin D deficiency 05/21/2013  . Medication management 05/21/2013  . Labile hypertension 01/31/2013  . Degenerative joint disease   . GERD (gastroesophageal reflux disease)   . Hyperlipidemia   . Hypothyroidism     has No Known Allergies.  MEDICAL HISTORY: Past Medical History:  Diagnosis Date  . Anemia with low platelet count (HCC)   . Chronic kidney disease   . Degenerative joint disease   . Dysrhythmia    a fib  . GERD (gastroesophageal reflux disease)   . History of kidney stones   . Hyperlipidemia   . Hypothyroidism   . Inguinal hernia 02/19/2019  . Labile hypertension   . Migraines    neurontin helps  . Pneumonia    hx of walking pneumonia   . Primary hypertension 04/28/2022    SURGICAL HISTORY: Past Surgical History:  Procedure Laterality Date  . BILATERAL CATARACT SURGERY     . BLERPHOROPLASTY \    . CHOLECYSTECTOMY  1996   open  . COLONOSCOPY  2009   recommended 10 year f/u  . HERNIA REPAIR  1985   umb hernia rpr  . HERNIA REPAIR  2005 & 2009   ventral hernia rpr  . INGUINAL HERNIA REPAIR Left 04/30/2020   Procedure: OPEN LEFT INGUINAL HERNIA REPAIR WITH MESH;  Surgeon: Berna Bue, MD;  Location: [facility];  Service: General;  Laterality: Left;  . INGUINAL HERNIA REPAIR Right 10/24/2021   Procedure: OPEN RIGHT INGUINAL HERNIA REPAIR WITH MESH ;  Surgeon:  Berna Bue, MD;  Location: [facility];  Service: General;  Laterality: Right;  . PILONIDAL CYST EXCISION  1972  . SKIN CANCER REMOVAL     . TONSILLECTOMY    . UPPER GI ENDOSCOPY    . VENTRAL HERNIA REPAIR      SOCIAL HISTORY: Social  History   Socioeconomic History  . Marital status: Married    Spouse name: Not on file  . Number of children: 2  . Years of education: 62  . Highest education level: Not on file  Occupational History  . Not on file  Tobacco Use  . Smoking status: Never  . Smokeless tobacco: Never  Vaping Use  . Vaping status: Never Used  Substance and Sexual Activity  . Alcohol use: No  . Drug use: No  . Sexual activity: Yes  Other Topics Concern  . Not on file  Social History Narrative  . Not on file   Social Determinants of Health   Financial Resource Strain: Not on file  Food Insecurity: No Food Insecurity (01/11/2023)   Hunger Vital Sign   . Worried About Programme researcher, broadcasting/film/video in the Last Year: Never true   . Ran Out of Food in the Last Year: Never true  Transportation Needs: No Transportation Needs (01/11/2023)   PRAPARE - Transportation   . Lack of Transportation (Medical): No   . Lack of Transportation (Non-Medical): No  Physical Activity: Not on file  Stress: Not on file  Social Connections: Not on file  Intimate Partner Violence: Not At Risk (01/11/2023)   Humiliation, Afraid, Rape, and Kick questionnaire   . Fear of Current or Ex-Partner: No   . Emotionally Abused: No   . Physically Abused: No   . Sexually Abused: No    FAMILY HISTORY: Family History  Problem Relation Age of Onset  . Ovarian cancer Mother   . Heart disease Mother   . Cancer Mother   . Uterine cancer Mother   . Hypertension Mother   . Breast cancer Sister   . Stroke Paternal Uncle   . Congestive Heart Failure Father   . Gout Maternal Aunt   . Colon cancer Neg Hx   . Esophageal cancer Neg Hx   . Stomach cancer Neg Hx   . Rectal cancer Neg Hx     Review of Systems - Oncology    PHYSICAL EXAMINATION    Vitals:   03/23/23 1034  BP: 132/77  Pulse: 64  Resp: 17  Temp: (!) 97.3 F (36.3 C)  SpO2: 97%    Physical Exam  LABORATORY DATA:  CBC    Component Value Date/Time   WBC 8.8  03/09/2023 1204   RBC 3.90 03/09/2023 1204   HGB 12.2 03/09/2023 1204   HGB 11.2 (L) 02/08/2023 1430   HGB 11.9 01/29/2023 1546   HCT 37.3 03/09/2023 1204   HCT 36.1 02/12/2023 1458   PLT 34 (L) 03/09/2023 1204   PLT 32 (L) 02/08/2023 1430   PLT 40 (LL) 01/29/2023 1546   MCV 95.6 03/09/2023 1204   MCV 94 01/29/2023 1546   MCH 31.3 03/09/2023 1204   MCHC 32.7 03/09/2023 1204   RDW 13.0 03/09/2023 1204   RDW 13.2 01/29/2023 1546   LYMPHSABS 2.1 03/09/2023 1204   MONOABS 1.0 03/09/2023 1204   EOSABS 0.1 03/09/2023 1204   BASOSABS 0.0 03/09/2023 1204    CMP     Component Value Date/Time   NA 140 03/09/2023 1204   NA 142 01/29/2023  1546   K 4.2 03/09/2023 1204   CL 105 03/09/2023 1204   CO2 31 03/09/2023 1204   GLUCOSE 57 (L) 03/09/2023 1204   BUN 25 (H) 03/09/2023 1204   BUN 26 01/29/2023 1546   CREATININE 1.24 (H) 03/09/2023 1204   CREATININE 1.27 (H) 01/18/2023 1219   CALCIUM 10.0 03/09/2023 1204   PROT 6.4 (L) 03/09/2023 1204   ALBUMIN 3.7 03/09/2023 1204   AST 16 03/09/2023 1204   ALT 13 03/09/2023 1204   ALKPHOS 54 03/09/2023 1204   BILITOT 0.6 03/09/2023 1204   GFRNONAA 44 (L) 03/09/2023 1204   GFRNONAA 45 (L) 10/20/2020 1542   GFRAA 52 (L) 10/20/2020 1542       PENDING LABS:   RADIOGRAPHIC STUDIES:  No results found.   PATHOLOGY:     ASSESSMENT and THERAPY PLAN:   No problem-specific Assessment & Plan notes found for this encounter.   No orders of the defined types were placed in this encounter.   All questions were answered. The patient knows to call the clinic with any problems, questions or concerns. We can certainly see the patient much sooner if necessary. This note was electronically signed. Noreene Filbert, NP 03/23/2023

## 2023-03-24 ENCOUNTER — Other Ambulatory Visit: Payer: Self-pay

## 2023-03-24 DIAGNOSIS — E782 Mixed hyperlipidemia: Secondary | ICD-10-CM

## 2023-03-24 DIAGNOSIS — E039 Hypothyroidism, unspecified: Secondary | ICD-10-CM

## 2023-03-24 DIAGNOSIS — D696 Thrombocytopenia, unspecified: Secondary | ICD-10-CM

## 2023-03-24 DIAGNOSIS — N1831 Chronic kidney disease, stage 3a: Secondary | ICD-10-CM

## 2023-03-24 DIAGNOSIS — I4891 Unspecified atrial fibrillation: Secondary | ICD-10-CM | POA: Diagnosis not present

## 2023-03-24 DIAGNOSIS — I10 Essential (primary) hypertension: Secondary | ICD-10-CM

## 2023-03-24 LAB — BASIC METABOLIC PANEL
Anion gap: 5 (ref 5–15)
BUN: 24 mg/dL — ABNORMAL HIGH (ref 8–23)
CO2: 23 mmol/L (ref 22–32)
Calcium: 8.8 mg/dL — ABNORMAL LOW (ref 8.9–10.3)
Chloride: 103 mmol/L (ref 98–111)
Creatinine, Ser: 0.89 mg/dL (ref 0.44–1.00)
GFR, Estimated: >60 mL/min (ref >60)
Glucose, Bld: 136 mg/dL — ABNORMAL HIGH (ref 70–99)
Potassium: 3.7 mmol/L (ref 3.5–5.1)
Sodium: 131 mmol/L — ABNORMAL LOW (ref 135–145)

## 2023-03-24 LAB — CBC
HCT: 33.7 % — ABNORMAL LOW (ref 36.0–46.0)
Hemoglobin: 11.6 g/dL — ABNORMAL LOW (ref 12.0–15.0)
MCH: 32.3 pg (ref 26.0–34.0)
MCHC: 34.4 g/dL (ref 30.0–36.0)
MCV: 93.9 fL (ref 80.0–100.0)
Platelets: 149 10*3/uL — ABNORMAL LOW (ref 150–400)
RBC: 3.59 MIL/uL — ABNORMAL LOW (ref 3.87–5.11)
RDW: 13.6 % (ref 11.5–15.5)
WBC: 5.6 10*3/uL (ref 4.0–10.5)
nRBC: 0 % (ref 0.0–0.2)

## 2023-03-24 LAB — HEPARIN LEVEL (UNFRACTIONATED): Heparin Unfractionated: >1.1 [IU]/mL — ABNORMAL HIGH (ref 0.30–0.70)

## 2023-03-24 MED ORDER — GABAPENTIN 300 MG PO CAPS: 300.0000 mg | ORAL_CAPSULE | Freq: Two times a day (BID) | ORAL | Status: DC

## 2023-03-24 MED ORDER — POTASSIUM CHLORIDE CRYS ER 20 MEQ PO TBCR
40.0000 meq | EXTENDED_RELEASE_TABLET | Freq: Once | ORAL | Status: AC
  Administered 2023-03-24: 40 meq via ORAL
  Filled 2023-03-24: qty 2

## 2023-03-24 MED ORDER — ACETAMINOPHEN 325 MG PO TABS
650.0000 mg | ORAL_TABLET | ORAL | Status: DC | PRN
Start: 2023-03-24 — End: 2023-03-25
  Administered 2023-03-24 (×2): 650 mg via ORAL
  Filled 2023-03-24 (×2): qty 2

## 2023-03-24 MED ORDER — PROPYLENE GLYCOL 0.6 % OP SOLN
1.0000 [drp] | Freq: Every evening | OPHTHALMIC | Status: DC | PRN
Start: 2023-03-24 — End: 2023-03-24

## 2023-03-24 MED ORDER — ONDANSETRON HCL 4 MG/2ML IJ SOLN
4.0000 mg | Freq: Four times a day (QID) | INTRAMUSCULAR | Status: DC | PRN
  Administered 2023-03-24: 4 mg via INTRAVENOUS
  Filled 2023-03-24: qty 2

## 2023-03-24 MED ORDER — POLYETHYL GLYCOL-PROPYL GLYCOL 0.4-0.3 % OP SOLN
1.0000 [drp] | Freq: Every day | OPHTHALMIC | Status: DC | PRN
Start: 2023-03-24 — End: 2023-03-24

## 2023-03-24 MED ORDER — OXYMETAZOLINE HCL 0.05 % NA SOLN
1.0000 | Freq: Two times a day (BID) | NASAL | Status: DC
  Administered 2023-03-24 (×2): 1 via NASAL
  Filled 2023-03-24: qty 30

## 2023-03-24 MED ORDER — GABAPENTIN 250 MG/5ML PO SOLN
600.0000 mg | Freq: Every day | ORAL | Status: DC
  Administered 2023-03-24: 600 mg via ORAL
  Filled 2023-03-24: qty 12

## 2023-03-24 MED ORDER — ATORVASTATIN CALCIUM 40 MG PO TABS
80.0000 mg | ORAL_TABLET | Freq: Every day | ORAL | Status: DC
  Administered 2023-03-24 – 2023-03-25 (×2): 80 mg via ORAL
  Filled 2023-03-24 (×2): qty 2

## 2023-03-24 MED ORDER — POLYVINYL ALCOHOL 1.4 % OP SOLN: 1.0000 [drp] | Freq: Two times a day (BID) | OPHTHALMIC | Status: DC | PRN

## 2023-03-24 MED ORDER — SODIUM CHLORIDE 0.9 % IV SOLN: 12.5000 mg | Freq: Four times a day (QID) | INTRAVENOUS | Status: DC | PRN

## 2023-03-24 MED ORDER — APIXABAN 5 MG PO TABS
5.0000 mg | ORAL_TABLET | Freq: Two times a day (BID) | ORAL | Status: DC
  Administered 2023-03-24 – 2023-03-25 (×3): 5 mg via ORAL
  Filled 2023-03-24 (×3): qty 1

## 2023-03-24 MED ORDER — DILTIAZEM HCL 60 MG PO TABS
60.0000 mg | ORAL_TABLET | Freq: Three times a day (TID) | ORAL | Status: DC
  Administered 2023-03-24 – 2023-03-25 (×3): 60 mg via ORAL
  Filled 2023-03-24: qty 2
  Filled 2023-03-24 (×2): qty 1

## 2023-03-24 MED ORDER — MAGNESIUM OXIDE -MG SUPPLEMENT 400 (240 MG) MG PO TABS
400.0000 mg | ORAL_TABLET | Freq: Every day | ORAL | Status: DC
  Administered 2023-03-24 (×2): 400 mg via ORAL
  Filled 2023-03-24 (×2): qty 1

## 2023-03-24 MED ORDER — SALINE SPRAY 0.65 % NA SOLN: 1.0000 | NASAL | Status: DC | PRN

## 2023-03-24 MED ORDER — GABAPENTIN 250 MG/5ML PO SOLN
300.0000 mg | Freq: Every day | ORAL | Status: DC
  Filled 2023-03-24: qty 6

## 2023-03-24 MED ORDER — DIVALPROEX SODIUM ER 500 MG PO TB24
500.0000 mg | ORAL_TABLET | Freq: Every day | ORAL | Status: DC
  Administered 2023-03-24 – 2023-03-25 (×2): 500 mg via ORAL
  Filled 2023-03-24 (×2): qty 1

## 2023-03-24 MED ORDER — PANTOPRAZOLE SODIUM 40 MG PO TBEC
40.0000 mg | DELAYED_RELEASE_TABLET | Freq: Two times a day (BID) | ORAL | Status: DC
Start: 2023-03-24 — End: 2023-03-25
  Administered 2023-03-24 – 2023-03-25 (×3): 40 mg via ORAL
  Filled 2023-03-24 (×3): qty 1

## 2023-03-24 MED ORDER — GABAPENTIN 300 MG PO CAPS
300.0000 mg | ORAL_CAPSULE | Freq: Every day | ORAL | Status: DC
  Administered 2023-03-24 – 2023-03-25 (×2): 300 mg via ORAL
  Filled 2023-03-24 (×2): qty 1

## 2023-03-24 MED ORDER — LEVOTHYROXINE SODIUM 50 MCG PO TABS
50.0000 ug | ORAL_TABLET | Freq: Every day | ORAL | Status: DC
  Administered 2023-03-24 – 2023-03-25 (×2): 50 ug via ORAL
  Filled 2023-03-24 (×2): qty 1

## 2023-03-24 MED ORDER — GABAPENTIN 300 MG PO CAPS
600.0000 mg | ORAL_CAPSULE | Freq: Every day | ORAL | Status: DC
  Administered 2023-03-24: 600 mg via ORAL
  Filled 2023-03-24: qty 2

## 2023-03-24 NOTE — ED Notes (Signed)
Assumed care of patient. Patient resting comfortably in bed with no signs of acute distress noted. Waiting on ready hospital bed upstairs.

## 2023-03-24 NOTE — Discharge Instructions (Signed)

## 2023-03-24 NOTE — Progress Notes (Signed)
PHARMACY - ANTICOAGULATION CONSULT NOTE  Pharmacy Consult for apixaban Indication: atrial fibrillation  No Known Allergies  Patient Measurements: Height: 5' 2.5" (158.8 cm) Weight: 72.8 kg (160 lb 7.9 oz) IBW/kg (Calculated) : 51.25 HEPARIN DW (KG): 66.7   Vital Signs: Temp: 98 F (36.7 C) (12/07 0800) Temp Source: Oral (12/07 0434) BP: 122/79 (12/07 0800) Pulse Rate: 113 (12/07 0800)  Labs: Recent Labs    03/23/23 1104 03/23/23 1955 03/24/23 0445  HGB 11.1* 10.8* 11.6*  HCT 33.5* 31.9* 33.7*  PLT 130* 155 149*  CREATININE  --  1.10* 0.89    Estimated Creatinine Clearance: 48.5 mL/min (by C-G formula based on SCr of 0.89 mg/dL).   Medical History: Past Medical History:  Diagnosis Date   Anemia with low platelet count (HCC)    Chronic kidney disease    Degenerative joint disease    Dysrhythmia    a fib   GERD (gastroesophageal reflux disease)    History of kidney stones    Hyperlipidemia    Hypothyroidism    Inguinal hernia 02/19/2019   Labile hypertension    Migraines    neurontin helps   Pneumonia    hx of walking pneumonia    Primary hypertension 04/28/2022    Medications:  Infusions:   diltiazem (CARDIZEM) infusion 12.5 mg/hr (03/24/23 0438)   heparin 800 Units/hr (03/24/23 0314)   promethazine (PHENERGAN) injection (IM or IVPB)      Assessment: 65 yoF with Afib on heparin IV per pharmacy. CHADS2Vasc score =4.  Not previously placed on anticoagulation due to hx thrombocytopenia.   Pharmacy is now consulted to transition to apixaban.   Today, 03/24/2023: CBC: Hgb low/improved to 11.6, Plt remain low/stable at 149 SCr 0.89 No bleeding or complications reported.  Goal of Therapy:  Monitor platelets by anticoagulation protocol: Yes   Plan:  D/C heaprin and start apixaban at 10:00am Apixaban 5mg  PO BID Pharmacy to provide education and 30 day coupon prior to discharge. Pharmacy will sign off notes, but will continue to follow up  peripherally.   Lynann Beaver PharmD, BCPS [facility] main pharmacy [phone] 03/24/2023 9:02 AM

## 2023-03-24 NOTE — H&P (Signed)
History and Physical    Patient: Jillian Rodgers:[id number] DOB: 1945/07/23 DOA: 03/23/2023 DOS: the patient was seen and examined on 03/24/2023 PCP: [name], MD  Patient coming from: Home  Chief Complaint:  Chief Complaint  Patient presents with   Tachycardia   HPI: Jillian Rodgers is a 77 y.o. 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  Pt not on OAC due to recurrent iron def anemia and chronic thrombocytopenia.  Supposed to see cards later this month regarding a Watchman procedure.  Pts platelets had been worsening recently, failed an attempt to treat with decadron, so Dr. 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  Today pt with symptoms of palpitations onset this evening.  No CP.  In to ED with A.Fib in RVR.  Started on cardizem gtt for rate control and hospitalist asked to admit.  Review of Systems: As mentioned in the history of present illness. All other systems reviewed and are negative. Past Medical History:  Diagnosis Date   Anemia with low platelet count (HCC)    Chronic kidney disease    Degenerative joint disease    Dysrhythmia    a fib   GERD (gastroesophageal reflux disease)    History of kidney stones    Hyperlipidemia    Hypothyroidism    Inguinal hernia 02/19/2019   Labile hypertension    Migraines    neurontin helps   Pneumonia    hx of walking pneumonia    Primary hypertension 04/28/2022   Past Surgical History:  Procedure Laterality Date   BILATERAL CATARACT SURGERY      BLERPHOROPLASTY \     CHOLECYSTECTOMY  1996   open   COLONOSCOPY  2009   recommended 10 year f/u   HERNIA REPAIR  1985   umb hernia rpr   HERNIA REPAIR  2005 & 2009   ventral hernia rpr   INGUINAL HERNIA REPAIR Left 04/30/2020   Procedure: OPEN LEFT INGUINAL HERNIA REPAIR WITH MESH;  Surgeon: Berna Bue, MD;  Location: [facility];  Service: General;   Laterality: Left;   INGUINAL HERNIA REPAIR Right 10/24/2021   Procedure: OPEN RIGHT INGUINAL HERNIA REPAIR WITH MESH ;  Surgeon: Berna Bue, MD;  Location: [facility];  Service: General;  Laterality: Right;   PILONIDAL CYST EXCISION  1972   SKIN CANCER REMOVAL      TONSILLECTOMY     UPPER GI ENDOSCOPY     VENTRAL HERNIA REPAIR     Social History:  reports that she has never smoked. She has never used smokeless tobacco. She reports that she does not drink alcohol and does not use drugs.  No Known Allergies  Family History  Problem Relation Age of Onset   Ovarian cancer Mother    Heart disease Mother    Cancer Mother    Uterine cancer Mother    Hypertension Mother    Breast cancer Sister    Stroke Paternal Uncle    Congestive Heart Failure Father    Gout Maternal Aunt    Colon cancer Neg Hx    Esophageal cancer Neg Hx    Stomach cancer Neg Hx    Rectal cancer Neg Hx     Prior to Admission medications   Medication Sig Start Date End Date Taking? Authorizing Provider  Ascorbic Acid (VITAMIN C) 500 MG CAPS Take 500 mg by mouth daily.    Yes  [provider]  atorvastatin (LIPITOR) 80 MG tablet Take 1 tablet (80 mg total) by mouth daily. 02/19/23  Yes Cranford, Archie Patten, NP  BOTOX 100 units SOLR injection Inject 100 Units into the muscle See admin instructions. Inject 100 units intramuscularly every 6-8 weeks 10/21/20  Yes [provider]  cetirizine (ZYRTEC) 10 MG tablet Take 10 mg by mouth daily.   Yes [provider]  Cholecalciferol (VITAMIN D3) 125 MCG (5000 UT) TABS Take 5,000 Units by mouth daily.   Yes [provider]  divalproex (DEPAKOTE ER) 500 MG 24 hr tablet Take 1 tablet (500 mg total) by mouth daily. 02/19/23  Yes Cranford, Archie Patten, NP  furosemide (LASIX) 20 MG tablet Take 1 tablet (20 mg total) by mouth daily as needed. Patient taking differently: Take 20 mg by mouth daily as needed for fluid or edema. 01/03/23  Yes Cranford, Archie Patten, NP   gabapentin (NEURONTIN) 300 MG capsule Take 300 mg (1 tablet) in the morning and 600 mg (2 tablets) at bedtime 02/19/23  Yes Cranford, Archie Patten, NP  levothyroxine (SYNTHROID) 50 MCG tablet Take  1 tablet  Daily  on an empty stomach with only water for 30 minutes & no Antacid meds, Calcium or Magnesium for 4 hours & avoid Biotin                                                                                                                    /                                                                   TAKE                                         BY                                                 MOUTH Patient taking differently: Take 50 mcg by mouth See admin instructions. Take 50 mcg by mouth in the morning on an empty stomach, with only water. Take no antacids, calcium, or magnesium for 4 hours and avoid biotin. 11/15/22  Yes [name], MD  Magnesium Oxide 400 MG CAPS Take 1 capsule (400 mg total) by mouth at bedtime. 01/13/23  Yes Carollee Herter, DO  metoprolol tartrate (LOPRESSOR) 50 MG tablet Take 1 tablet (50 mg total) by mouth 2 (two) times daily. 01/13/23 04/13/23 Yes Carollee Herter, DO  pantoprazole (PROTONIX) 40 MG tablet Take  1 tablet  2 x /day  for Acid Indigestion &  Heartburn Patient taking differently: Take 40 mg by mouth in the morning and at bedtime. 02/01/22  Yes [name], MD  SYSTANE BALANCE 0.6 % SOLN Place 1 drop into both eyes at bedtime as needed (dry eyes).   Yes [provider]  SYSTANE ULTRA 0.4-0.3 % SOLN Place 1 drop into both eyes daily as needed (dry eyes).   Yes [provider]  TYLENOL 500 MG tablet Take 500-1,000 mg by mouth every 6 (six) hours as needed for mild pain or headache.   Yes [provider]  Zinc 30 MG TABS Take 30 mg by mouth daily.   Yes [provider]    Physical Exam: Vitals:   03/23/23 2323 03/23/23 2337 03/24/23 0000 03/24/23 0030  BP: (!) 162/100  (!) 133/93 137/84  Pulse:  (!) 118 (!) 104 80  Resp:  16  14 13   Temp:  98 F (36.7 C)    TempSrc:  Oral    SpO2:  97% 96% 93%  Weight:      Height:       Constitutional: NAD, calm, comfortable Respiratory: clear to auscultation bilaterally, no wheezing, no crackles. Normal respiratory effort. No accessory muscle use.  Cardiovascular: IRR, IRR Abdomen: no tenderness, no masses palpated. No hepatosplenomegaly. Bowel sounds positive.  Neurologic: CN 2-12 grossly intact. Sensation intact, DTR normal. Strength 5/5 in all 4.  Psychiatric: Normal judgment and insight. Alert and oriented x 3. Normal mood.   Data Reviewed:    Labs on Admission: I have personally reviewed following labs and imaging studies  CBC: Recent Labs  Lab 03/23/23 1104 03/23/23 1955  WBC 4.5 5.3  NEUTROABS 2.4  --   HGB 11.1* 10.8*  HCT 33.5* 31.9*  MCV 97.1 96.4  PLT 130* 155   Basic Metabolic Panel: Recent Labs  Lab 03/23/23 1955  NA 133*  K 3.4*  CL 101  CO2 24  GLUCOSE 115*  BUN 32*  CREATININE 1.10*  CALCIUM 9.4  MG 2.2   GFR: Estimated Creatinine Clearance: 39.2 mL/min (A) (by C-G formula based on SCr of 1.1 mg/dL (H)). Liver Function Tests: No results for input(s): "AST", "ALT", "ALKPHOS", "BILITOT", "PROT", "ALBUMIN" in the last 168 hours. No results for input(s): "LIPASE", "AMYLASE" in the last 168 hours. No results for input(s): "AMMONIA" in the last 168 hours. Coagulation Profile: No results for input(s): "INR", "PROTIME" in the last 168 hours. Cardiac Enzymes: No results for input(s): "CKTOTAL", "CKMB", "CKMBINDEX", "TROPONINI" in the last 168 hours. BNP (last 3 results) No results for input(s): "PROBNP" in the last 8760 hours. HbA1C: No results for input(s): "HGBA1C" in the last 72 hours. CBG: No results for input(s): "GLUCAP" in the last 168 hours. Lipid Profile: No results for input(s): "CHOL", "HDL", "LDLCALC", "TRIG", "CHOLHDL", "LDLDIRECT" in the last 72 hours. Thyroid Function Tests: Recent Labs    03/23/23 1955  TSH 3.248    Anemia Panel: No results for input(s): "VITAMINB12", "FOLATE", "FERRITIN", "TIBC", "IRON", "RETICCTPCT" in the last 72 hours. Urine analysis:    Component Value Date/Time   COLORURINE YELLOW 01/18/2023 1219   APPEARANCEUR CLEAR 01/18/2023 1219   LABSPEC 1.016 01/18/2023 1219   PHURINE 7.5 01/18/2023 1219   GLUCOSEU NEGATIVE 01/18/2023 1219   HGBUR NEGATIVE 01/18/2023 1219   BILIRUBINUR NEGATIVE 01/09/2023 1620   KETONESUR NEGATIVE 01/18/2023 1219   PROTEINUR TRACE (A) 01/18/2023 1219   NITRITE NEGATIVE 01/18/2023 1219   LEUKOCYTESUR 1+ (A) 01/18/2023 1219    Radiological Exams on Admission: [place] [facility]  1 View  Result Date: 03/23/2023 CLINICAL DATA:  AFib, tachycardia.  Shortness of breath. EXAM: PORTABLE CHEST 1 VIEW COMPARISON:  01/09/2023. FINDINGS: The heart is enlarged and the mediastinal contour is stable. There is atherosclerotic calcification of the aorta. A moderate-to-large hiatal hernia is present. No consolidation, effusion, or pneumothorax. No acute osseous abnormality. IMPRESSION: 1. No active disease. 2. Moderate-to-large hiatal hernia. 3. Cardiomegaly. Electronically Signed   By: Thornell Sartorius M.D.   On: 03/23/2023 20:22    EKG: Independently reviewed.   Assessment and Plan: * Atrial fibrillation with RVR (HCC) Cardizem gtt for rate control Pt had been planning on pursuing Watchman's for stroke prevention given chronic thrombocytopenia + recurrent iron def anemia. However, today pt with normal / near normal platelets today for first time in 7 years (thanks to last weeks IVIG treatment I suspect): Heparin gtt for stroke prevention overnight Day team will need to decide wether to start her on eliquis before discharge or not Sent a in basket message to Dr. Al Pimple with an update as well Tele monitor Daily CBC  Thrombocytopenia (HCC) Platelets of 155 today are much higher than her baseline thrombocytopenia (usually runs between 30 and 60). 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 Daily CBC while here See discussion above regarding AC  Primary hypertension Cont home lopressor Now also on cardizem gtt  CKD stage 3a, GFR 45-59 ml/min (HCC) - baseline SCr 1.1-1.4 Creat 1.1 today, baseline.  Hypothyroidism Check TSH Cont home synthroid  Hyperlipidemia Cont Statin      Advance Care Planning:   Code Status: Full Code  Consults: None  Family Communication: No family in room  Severity of Illness: The appropriate patient status for this patient is OBSERVATION. Observation status is judged to be reasonable and necessary in order to provide the required intensity of service to ensure the patient's safety. The patient's presenting symptoms, physical exam findings, and initial radiographic and laboratory data in the context of their medical condition is felt to place them at decreased risk for further clinical deterioration. Furthermore, it is anticipated that the patient will be medically stable for discharge from the hospital within 2 midnights of admission.   Author: [name] Bow., DO 03/24/2023 2:05 AM  For on call review [email or web address].

## 2023-03-24 NOTE — Consult Note (Addendum)
CONSULTATION NOTE   Patient Name: Jillian Rodgers Date of Encounter: 03/24/2023 Cardiologist: [name], MD Electrophysiologist: None Advanced Heart Failure: None   Chief Complaint   Afib with RVR  Patient Profile   77 yo female with history of paroxysmal atrial fibrillation, hypertension, CKD 3B and dyslipidemia.  She now presents with A-fib and rapid ventricular response.  HPI   Jillian Rodgers is a 77 y.o. female who is being seen today for the evaluation of A-fib with RVR at the request of Dr. Rito Ehrlich.  This is a 77 year old female with a history of paroxysmal atrial fibrillation, hypothyroidism, dyslipidemia, CKD 3B and chronic thrombocytopenia.  She has not been anticoagulated due to history of anemia and thrombocytopenia.  There was discussion about possible Watchman procedure.  She was previously followed by Dr. Katrinka Blazing but then transition to Dr. Izora Ribas and more recently switched to Dr. Bjorn Pippin.  She presents with palpitations that started yesterday evening and was noted to be in A-fib with RVR.  She was started on IV diltiazem and cardiology was consulted.  Prior to admission she had been on metoprolol tartrate 50 mg twice daily for rate control.  Labs initially showed mild hypokalemia potassium 3.4.  Creatinine was elevated however now is normalized.  Hemoglobin 11 and hematocrit 33.  Platelets 149k, which is up from as low as 33k about a month ago.  Her last echo was in September 2024 which showed LVEF 60 to 65% and severe left atrial enlargement.  She has reported recent fatigue which may be the symptoms of her A-fib.  PMHx   Past Medical History:  Diagnosis Date   Anemia with low platelet count (HCC)    Chronic kidney disease    Degenerative joint disease    Dysrhythmia    a fib   GERD (gastroesophageal reflux disease)    History of kidney stones    Hyperlipidemia    Hypothyroidism    Inguinal hernia 02/19/2019   Labile hypertension     Migraines    neurontin helps   Pneumonia    hx of walking pneumonia    Primary hypertension 04/28/2022    Past Surgical History:  Procedure Laterality Date   BILATERAL CATARACT SURGERY      BLERPHOROPLASTY \     CHOLECYSTECTOMY  1996   open   COLONOSCOPY  2009   recommended 10 year f/u   HERNIA REPAIR  1985   umb hernia rpr   HERNIA REPAIR  2005 & 2009   ventral hernia rpr   INGUINAL HERNIA REPAIR Left 04/30/2020   Procedure: OPEN LEFT INGUINAL HERNIA REPAIR WITH MESH;  Surgeon: Berna Bue, MD;  Location: [facility];  Service: General;  Laterality: Left;   INGUINAL HERNIA REPAIR Right 10/24/2021   Procedure: OPEN RIGHT INGUINAL HERNIA REPAIR WITH MESH ;  Surgeon: Berna Bue, MD;  Location: [facility];  Service: General;  Laterality: Right;   PILONIDAL CYST EXCISION  1972   SKIN CANCER REMOVAL      TONSILLECTOMY     UPPER GI ENDOSCOPY     VENTRAL HERNIA REPAIR      FAMHx   Family History  Problem Relation Age of Onset   Ovarian cancer Mother    Heart disease Mother    Cancer Mother    Uterine cancer Mother    Hypertension Mother    Breast cancer Sister    Stroke Paternal Uncle    Congestive Heart Failure Father    Gout  Maternal Aunt    Colon cancer Neg Hx    Esophageal cancer Neg Hx    Stomach cancer Neg Hx    Rectal cancer Neg Hx     SOCHx    reports that she has never smoked. She has never used smokeless tobacco. She reports that she does not drink alcohol and does not use drugs.  Outpatient Medications   No current facility-administered medications on file prior to encounter.   Current Outpatient Medications on File Prior to Encounter  Medication Sig Dispense Refill   Ascorbic Acid (VITAMIN C) 500 MG CAPS Take 500 mg by mouth daily.      atorvastatin (LIPITOR) 80 MG tablet Take 1 tablet (80 mg total) by mouth daily. 30 tablet 3   BOTOX 100 units SOLR injection Inject 100 Units into the muscle See admin instructions. Inject 100 units intramuscularly  every 6-8 weeks     cetirizine (ZYRTEC) 10 MG tablet Take 10 mg by mouth daily.     Cholecalciferol (VITAMIN D3) 125 MCG (5000 UT) TABS Take 5,000 Units by mouth daily.     divalproex (DEPAKOTE ER) 500 MG 24 hr tablet Take 1 tablet (500 mg total) by mouth daily. 30 tablet 3   furosemide (LASIX) 20 MG tablet Take 1 tablet (20 mg total) by mouth daily as needed. (Patient taking differently: Take 20 mg by mouth daily as needed for fluid or edema.) 30 tablet 2   gabapentin (NEURONTIN) 300 MG capsule Take 300 mg (1 tablet) in the morning and 600 mg (2 tablets) at bedtime 90 capsule 3   levothyroxine (SYNTHROID) 50 MCG tablet Take  1 tablet  Daily  on an empty stomach with only water for 30 minutes & no Antacid meds, Calcium or Magnesium for 4 hours & avoid Biotin                                                                                                                    /                                                                   TAKE                                         BY                                                 MOUTH (Patient taking differently: Take 50 mcg by mouth See admin instructions. Take 50 mcg by mouth in the morning  on an empty stomach, with only water. Take no antacids, calcium, or magnesium for 4 hours and avoid biotin.) 90 tablet 3   Magnesium Oxide 400 MG CAPS Take 1 capsule (400 mg total) by mouth at bedtime.     metoprolol tartrate (LOPRESSOR) 50 MG tablet Take 1 tablet (50 mg total) by mouth 2 (two) times daily. 180 tablet 0   pantoprazole (PROTONIX) 40 MG tablet Take  1 tablet  2 x /day  for Acid Indigestion & Heartburn (Patient taking differently: Take 40 mg by mouth in the morning and at bedtime.) 180 tablet 3   SYSTANE BALANCE 0.6 % SOLN Place 1 drop into both eyes at bedtime as needed (dry eyes).     SYSTANE ULTRA 0.4-0.3 % SOLN Place 1 drop into both eyes daily as needed (dry eyes).     TYLENOL 500 MG tablet Take 500-1,000 mg by mouth every 6 (six) hours as  needed for mild pain or headache.     Zinc 30 MG TABS Take 30 mg by mouth daily.      Inpatient Medications    Scheduled Meds:  atorvastatin  80 mg Oral Daily   divalproex  500 mg Oral Daily   gabapentin  300 mg Oral Daily   And   gabapentin  600 mg Oral QHS   levothyroxine  50 mcg Oral Q0600   magnesium oxide  400 mg Oral QHS   metoprolol tartrate  25 mg Oral BID   pantoprazole  40 mg Oral BID    Continuous Infusions:  diltiazem (CARDIZEM) infusion 12.5 mg/hr (03/24/23 0438)   heparin 800 Units/hr (03/24/23 0314)   promethazine (PHENERGAN) injection (IM or IVPB)      PRN Meds: acetaminophen, ondansetron (ZOFRAN) IV, polyvinyl alcohol, promethazine (PHENERGAN) injection (IM or IVPB)   ALLERGIES   No Known Allergies  ROS   Pertinent items noted in HPI and remainder of comprehensive ROS otherwise negative.  Vitals   Vitals:   03/24/23 0430 03/24/23 0434 03/24/23 0700 03/24/23 0715  BP: (!) 157/96  (!) 144/86 (!) 147/93  Pulse: (!) 108 92 (!) 110 (!) 121  Resp: 14 14 15  (!) 9  Temp:  98 F (36.7 C)    TempSrc:  Oral    SpO2: 96% 96% 92% 98%  Weight:      Height:        Intake/Output Summary (Last 24 hours) at 03/24/2023 0800 Last data filed at 03/23/2023 2115 Gross per 24 hour  Intake 500.11 ml  Output --  Net 500.11 ml   Filed Weights   03/23/23 1946  Weight: 72.8 kg    Physical Exam   General appearance: alert and no distress Neck: no carotid bruit, no JVD, and thyroid not enlarged, symmetric, no tenderness/mass/nodules Lungs: clear to auscultation bilaterally Heart: irregularly irregular rhythm Abdomen: soft, non-tender; bowel sounds normal; no masses,  no organomegaly Extremities: edema trace pedal bilateral Pulses: 2+ and symmetric Skin: Skin color, texture, turgor normal. No rashes or lesions Neurologic: Grossly normal Psych: Pleasant  Labs   Results for orders placed or performed during the hospital encounter of 03/23/23 (from the past 48  hour(s))  Basic metabolic panel     Status: Abnormal   Collection Time: 03/23/23  7:55 PM  Result Value Ref Range   Sodium 133 (L) 135 - 145 mmol/L   Potassium 3.4 (L) 3.5 - 5.1 mmol/L   Chloride 101 98 - 111 mmol/L   CO2 24 22 - 32 mmol/L   Glucose,  Bld 115 (H) 70 - 99 mg/dL    Comment: Glucose reference range applies only to samples taken after fasting for at least 8 hours.   BUN 32 (H) 8 - 23 mg/dL   Creatinine, Ser 4.69 (H) 0.44 - 1.00 mg/dL   Calcium 9.4 8.9 - 62.9 mg/dL   GFR, Estimated 51 (L) >60 mL/min    Comment: (NOTE) Calculated using the CKD-EPI Creatinine Equation (2021)    Anion gap 8 5 - 15    Comment: Performed at [facility], [address] [place], [place] [address]  Magnesium     Status: None   Collection Time: 03/23/23  7:55 PM  Result Value Ref Range   Magnesium 2.2 1.7 - 2.4 mg/dL    Comment: Performed at [facility], [address], [place], [place] [address]  CBC     Status: Abnormal   Collection Time: 03/23/23  7:55 PM  Result Value Ref Range   WBC 5.3 4.0 - 10.5 K/uL   RBC 3.31 (L) 3.87 - 5.11 MIL/uL   Hemoglobin 10.8 (L) 12.0 - 15.0 g/dL   HCT 10.2 (L) 72.5 - 36.6 %   MCV 96.4 80.0 - 100.0 fL   MCH 32.6 26.0 - 34.0 pg   MCHC 33.9 30.0 - 36.0 g/dL   RDW 44.0 34.7 - 42.5 %   Platelets 155 150 - 400 K/uL    Comment: REPEATED TO VERIFY   nRBC 0.0 0.0 - 0.2 %    Comment: Performed at [facility], [address], [place], [place] [address]  TSH     Status: None   Collection Time: 03/23/23  7:55 PM  Result Value Ref Range   TSH 3.248 0.350 - 4.500 uIU/mL    Comment: Performed by a 3rd Generation assay with a functional sensitivity of <=0.01 uIU/mL. Performed at [facility], [address] [place], [place] [address]   CBC     Status: Abnormal   Collection Time: 03/24/23  4:45 AM  Result Value Ref Range   WBC 5.6 4.0 - 10.5 K/uL   RBC 3.59 (L) 3.87 - 5.11 MIL/uL    Hemoglobin 11.6 (L) 12.0 - 15.0 g/dL   HCT 32.9 (L) 51.8 - 84.1 %   MCV 93.9 80.0 - 100.0 fL   MCH 32.3 26.0 - 34.0 pg   MCHC 34.4 30.0 - 36.0 g/dL   RDW 66.0 63.0 - 16.0 %   Platelets 149 (L) 150 - 400 K/uL   nRBC 0.0 0.0 - 0.2 %    Comment: Performed at [facility], [address] [place], [place] [address]  Basic metabolic panel     Status: Abnormal   Collection Time: 03/24/23  4:45 AM  Result Value Ref Range   Sodium 131 (L) 135 - 145 mmol/L   Potassium 3.7 3.5 - 5.1 mmol/L   Chloride 103 98 - 111 mmol/L   CO2 23 22 - 32 mmol/L   Glucose, Bld 136 (H) 70 - 99 mg/dL    Comment: Glucose reference range applies only to samples taken after fasting for at least 8 hours.   BUN 24 (H) 8 - 23 mg/dL   Creatinine, Ser 3.55 0.44 - 1.00 mg/dL   Calcium 8.8 (L) 8.9 - 10.3 mg/dL   GFR, Estimated >73 >22 mL/min    Comment: (NOTE) Calculated using the CKD-EPI Creatinine Equation (2021)    Anion gap 5 5 - 15    Comment:  Performed at [facility], [address] [place], [place] [address]    ECG   A-fib with RVR at 106- Personally Reviewed  Telemetry   Atrial fibrillation with controlled ventricular response- Personally Reviewed  Radiology   DG Chest Port 1 View  Result Date: 03/23/2023 CLINICAL DATA:  AFib, tachycardia.  Shortness of breath. EXAM: PORTABLE CHEST 1 VIEW COMPARISON:  01/09/2023. FINDINGS: The heart is enlarged and the mediastinal contour is stable. There is atherosclerotic calcification of the aorta. A moderate-to-large hiatal hernia is present. No consolidation, effusion, or pneumothorax. No acute osseous abnormality. IMPRESSION: 1. No active disease. 2. Moderate-to-large hiatal hernia. 3. Cardiomegaly. Electronically Signed   By: Thornell Sartorius M.D.   On: 03/23/2023 20:22    Cardiac Studies   N/A  Impression   Principal Problem:   Atrial fibrillation with RVR (HCC) Active Problems:   Hyperlipidemia   Hypothyroidism   CKD  stage 3a, GFR 45-59 ml/min (HCC) - baseline SCr 1.1-1.4   Thrombocytopenia (HCC)   Primary hypertension   Recommendation   Osterkamp is in A-fib with RVR.  She has had A-fib in the past and required cardioversion.  She has not been recently anticoagulated due to anemia and thrombocytopenia.  Fortunately her thrombocytopenia has improved.  This was likely due to recent IVIG therapy.  She is currently on heparin and diltiazem with rate control.  Would recommend continuing rate control strategy since she is not anticoagulated with both metoprolol and diltiazem.  Ultimately, she will likely need antiarrhythmic drug therapy since she has severe left atrial enlargement.  If she converts within the next 24 hours then she would likely be discharged on oral anticoagulation.  There is discussion about possible Watchman procedure however she would need a short period of anticoagulation on watchman anyhow.  If she does not convert, then would likely proceed with a TEE/DCCV on Monday.  Would convert heparin over to Eliquis either way to assure that she is adequately established and at steady state if she undergoes DCCV on Monday.  No planned procedures today.  She can have a diet.  Thanks for the consultation.  Cardiology will follow with you.  Time Spent Directly with Patient:  I have spent a total of 45 minutes with the patient reviewing hospital notes, telemetry, EKGs, labs and examining the patient as well as establishing an assessment and plan that was discussed personally with the patient.  > 50% of time was spent in direct patient care.  Length of Stay:  LOS: 0 days   Chrystie Nose, MD, [facility], FACP  Protection  [facility]  Medical Director of the Advanced Lipid Disorders &  Cardiovascular Risk Reduction Clinic Diplomate of the American Board of Clinical Lipidology Attending Cardiologist  Direct Dial: [phone]  Fax: [phone]  Website:  [email or web address]   Lisette Abu  Aaryn Parrilla 03/24/2023, 8:00 AM

## 2023-03-24 NOTE — ED Notes (Signed)
Pt IV pump beeping. Pt ordered heparin changed to another pump.

## 2023-03-24 NOTE — Progress Notes (Signed)
[facility] PROGRESS NOTE   EMEREY YUEH ZOX:[id number] DOB: [birth year] DOA: 03/23/2023  PCP: [name], MD  Brief History: 77 y.o. 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 Pt not on OAC due to recurrent iron def anemia and chronic thrombocytopenia.  Supposed to see cards later this month regarding a Watchman procedure. Pts platelets had been worsening recently, failed an attempt to treat with decadron, so Dr. 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  Presented with palpitations.  Was found to have atrial fibrillation with RVR.  Placed on Cardizem infusion and hospitalized.  Consultants: Cardiology  Procedures: None yet    Subjective/Interval History: Complains of some nausea but no vomiting this morning.  Denies any chest pain.  Did have shortness of breath last night but none this morning.  No dizziness or lightheadedness currently.    Assessment/Plan:  Atrial fibrillation with RVR Known history of same.  Uses metoprolol at home.  Has been compliant with her medications.  Was on Eliquis few months ago but had to be discontinued due to drop in platelet counts. Platelet counts have improved.  Was started on heparin overnight. Cardiology has been consulted to assist with management.  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  Received IVIG last week.  Platelet counts noted to be normal.  Continue to monitor closely while she is on anticoagulation.  Essential hypertension Monitor blood pressures closely.  Patient on Cardizem infusion.  Normocytic anemia No evidence of overt bleeding.  Continue to monitor.  Hyponatremia Monitor daily.  Hypothyroidism Continue with levothyroxine.  TSH is normal.  Hyperlipidemia Continue statin.  Chronic kidney disease stage IIIa Stable   DVT Prophylaxis: On IV heparin Code Status: Full  code Family Communication: Discussed with patient Disposition Plan: Hopefully return home when improved  Status is: Observation The patient will require care spanning > 2 midnights and should be moved to inpatient because: Atrial fibrillation on Cardizem infusion      Medications: Scheduled:  apixaban  5 mg Oral BID   atorvastatin  80 mg Oral Daily   divalproex  500 mg Oral Daily   gabapentin  300 mg Oral Daily   And   gabapentin  600 mg Oral QHS   levothyroxine  50 mcg Oral Q0600   magnesium oxide  400 mg Oral QHS   metoprolol tartrate  25 mg Oral BID   pantoprazole  40 mg Oral BID   Continuous:  diltiazem (CARDIZEM) infusion 12.5 mg/hr (03/24/23 0438)   heparin 800 Units/hr (03/24/23 0314)   promethazine (PHENERGAN) injection (IM or IVPB)     [id number], ondansetron (ZOFRAN) IV, polyvinyl alcohol, promethazine (PHENERGAN) injection (IM or IVPB)  Antibiotics: Anti-infectives (From admission, onward)    None       Objective:  Vital Signs  Vitals:   03/24/23 0434 03/24/23 0700 03/24/23 0715 03/24/23 0800  BP:  (!) 144/86 (!) 147/93 122/79  Pulse: 92 (!) 110 (!) 121 (!) 113  Resp: 14 15 (!) 9 15  Temp: 98 F (36.7 C)   98 F (36.7 C)  TempSrc: Oral     SpO2: 96% 92% 98% 97%  Weight:      Height:        Intake/Output Summary (Last 24 hours) at 03/24/2023 0906 Last data filed at 03/23/2023 2115 Gross per 24 hour  Intake 500.11 ml  Output --  Net 500.11 ml   Ceasar Mons  Weights   03/23/23 1946  Weight: 72.8 kg    General appearance: Awake alert.  In no distress Resp: Clear to auscultation bilaterally.  Normal effort Cardio: S1-S2 is clearly irregular GI: Abdomen is soft.  Nontender nondistended.  Bowel sounds are present normal.  No masses organomegaly Extremities: No edema.  Full range of motion of lower extremities. Neurologic: Alert and oriented x3.  No focal neurological deficits.    Lab Results:  Data Reviewed: I have personally reviewed  following labs and reports of the imaging studies  CBC: Recent Labs  Lab 03/23/23 1104 03/23/23 1955 03/24/23 0445  WBC 4.5 5.3 5.6  NEUTROABS 2.4  --   --   HGB 11.1* 10.8* 11.6*  HCT 33.5* 31.9* 33.7*  MCV 97.1 96.4 93.9  PLT 130* 155 149*    Basic Metabolic Panel: Recent Labs  Lab 03/23/23 1955 03/24/23 0445  NA 133* 131*  K 3.4* 3.7  CL 101 103  CO2 24 23  GLUCOSE 115* 136*  BUN 32* 24*  CREATININE 1.10* 0.89  CALCIUM 9.4 8.8*  MG 2.2  --     GFR: Estimated Creatinine Clearance: 48.5 mL/min (by C-G formula based on SCr of 0.89 mg/dL).   Thyroid Function Tests: Recent Labs    03/23/23 1955  TSH 3.248    Radiology Studies: [facility] Chest Port 1 View  Result Date: 03/23/2023 CLINICAL DATA:  AFib, tachycardia.  Shortness of breath. EXAM: PORTABLE CHEST 1 VIEW COMPARISON:  01/09/2023. FINDINGS: The heart is enlarged and the mediastinal contour is stable. There is atherosclerotic calcification of the aorta. A moderate-to-large hiatal hernia is present. No consolidation, effusion, or pneumothorax. No acute osseous abnormality. IMPRESSION: 1. No active disease. 2. Moderate-to-large hiatal hernia. 3. Cardiomegaly. Electronically Signed   By: Thornell Sartorius M.D.   On: 03/23/2023 20:22       LOS: 0 days   Zaviyar Rahal Rito Ehrlich  [facility] Pager on [email or web address]  03/24/2023, 9:06 AM

## 2023-03-24 NOTE — ED Notes (Signed)
ED TO INPATIENT HANDOFF REPORT  Name/Age/Gender Jillian Rodgers 77 y.o. female  Code Status    Code Status Orders  (From admission, onward)           Start     Ordered   03/24/23 0145  Full code  Continuous       Question:  By:  Answer:  Default: patient does not have capacity for decision making, no surrogate or prior directive available   03/24/23 0146           Code Status History     Date Active Date Inactive Code Status Order ID Comments User Context   02/13/2023 0934 02/14/2023 0507 Full Code [id number]  Bennie Dallas, MD HOV   01/09/2023 2002 01/13/2023 1537 Full Code [id number]  Maryln Gottron, MD ED   10/17/2022 1922 10/18/2022 2329 Full Code [id number]  Darlin Drop, DO Inpatient       Home/SNF/Other Home  Chief Complaint Atrial fibrillation with RVR (HCC) [I48.91]  Level of Care/Admitting Diagnosis ED Disposition     ED Disposition  Admit   Condition  --   Comment  Hospital Area: [facility] [[id number]]  Level of Care: Progressive [102]  Admit to Progressive based on following criteria: CARDIOVASCULAR & THORACIC of moderate stability with acute coronary syndrome symptoms/low risk myocardial infarction/hypertensive urgency/arrhythmias/heart failure potentially compromising stability and stable post cardiovascular intervention patients.  May place patient in observation at [facility] or [facility] if equivalent level of care is available:: No  Covid Evaluation: Asymptomatic - no recent exposure (last 10 days) testing not required  Diagnosis: Atrial fibrillation with RVR [facility]) [[id number]]  Admitting Physician: [name] Bow [[id number]]  Attending Physician: [name] Bow [phone]          Medical History Past Medical History:  Diagnosis Date   Anemia with low platelet count (HCC)    Chronic kidney disease    Degenerative joint disease    Dysrhythmia    a fib   GERD (gastroesophageal reflux disease)    History of kidney  stones    Hyperlipidemia    Hypothyroidism    Inguinal hernia 02/19/2019   Labile hypertension    Migraines    neurontin helps   Pneumonia    hx of walking pneumonia    Primary hypertension 04/28/2022    Allergies No Known Allergies  IV Location/Drains/Wounds Patient Lines/Drains/Airways Status     Active Line/Drains/Airways     Name Placement date Placement time Site Days   Peripheral IV 03/22/23 22 G Anterior;Left;Proximal Forearm 03/22/23  0833  Forearm  2   Peripheral IV 03/23/23 20 G 1" Anterior;Left Forearm 03/23/23  1956  Forearm  1   Peripheral IV 03/23/23 20 G Posterior;Right Hand 03/23/23  1956  Hand  1            Labs/Imaging Results for orders placed or performed during the hospital encounter of 03/23/23 (from the past 48 hour(s))  Basic metabolic panel     Status: Abnormal   Collection Time: 03/23/23  7:55 PM  Result Value Ref Range   Sodium 133 (L) 135 - 145 mmol/L   Potassium 3.4 (L) 3.5 - 5.1 mmol/L   Chloride 101 98 - 111 mmol/L   CO2 24 22 - 32 mmol/L   Glucose, Bld 115 (H) 70 - 99 mg/dL    Comment: Glucose reference range applies only to samples taken after fasting for at least 8 hours.   BUN 32 (H)  8 - 23 mg/dL   Creatinine, Ser 1.61 (H) 0.44 - 1.00 mg/dL   Calcium 9.4 8.9 - 09.6 mg/dL   GFR, Estimated 51 (L) >60 mL/min    Comment: (NOTE) Calculated using the CKD-EPI Creatinine Equation (2021)    Anion gap 8 5 - 15    Comment: Performed at [facility], [address], [place], [place] [address]  Magnesium     Status: None   Collection Time: 03/23/23  7:55 PM  Result Value Ref Range   Magnesium 2.2 1.7 - 2.4 mg/dL    Comment: Performed at [facility], [address], [place], [place] [address]  CBC     Status: Abnormal   Collection Time: 03/23/23  7:55 PM  Result Value Ref Range   WBC 5.3 4.0 - 10.5 K/uL   RBC 3.31 (L) 3.87 - 5.11 MIL/uL   Hemoglobin 10.8 (L) 12.0 - 15.0 g/dL   HCT 14.7 (L) 82.9 -  46.0 %   MCV 96.4 80.0 - 100.0 fL   MCH 32.6 26.0 - 34.0 pg   MCHC 33.9 30.0 - 36.0 g/dL   RDW 56.2 13.0 - 86.5 %   Platelets 155 150 - 400 K/uL    Comment: REPEATED TO VERIFY   nRBC 0.0 0.0 - 0.2 %    Comment: Performed at [facility], [address] [place], [place] [address]  TSH     Status: None   Collection Time: 03/23/23  7:55 PM  Result Value Ref Range   TSH 3.248 0.350 - 4.500 uIU/mL    Comment: Performed by a 3rd Generation assay with a functional sensitivity of <=0.01 uIU/mL. Performed at [facility], [address], [place], [place] [address]   CBC     Status: Abnormal   Collection Time: 03/24/23  4:45 AM  Result Value Ref Range   WBC 5.6 4.0 - 10.5 K/uL   RBC 3.59 (L) 3.87 - 5.11 MIL/uL   Hemoglobin 11.6 (L) 12.0 - 15.0 g/dL   HCT 84.1 (L) 32.4 - 40.1 %   MCV 93.9 80.0 - 100.0 fL   MCH 32.3 26.0 - 34.0 pg   MCHC 34.4 30.0 - 36.0 g/dL   RDW 02.7 25.3 - 66.4 %   Platelets 149 (L) 150 - 400 K/uL   nRBC 0.0 0.0 - 0.2 %    Comment: Performed at [facility], [address], [place], [place] [address]  Basic metabolic panel     Status: Abnormal   Collection Time: 03/24/23  4:45 AM  Result Value Ref Range   Sodium 131 (L) 135 - 145 mmol/L   Potassium 3.7 3.5 - 5.1 mmol/L   Chloride 103 98 - 111 mmol/L   CO2 23 22 - 32 mmol/L   Glucose, Bld 136 (H) 70 - 99 mg/dL    Comment: Glucose reference range applies only to samples taken after fasting for at least 8 hours.   BUN 24 (H) 8 - 23 mg/dL   Creatinine, Ser 4.25 0.44 - 1.00 mg/dL   Calcium 8.8 (L) 8.9 - 10.3 mg/dL   GFR, Estimated >95 >63 mL/min    Comment: (NOTE) Calculated using the CKD-EPI Creatinine Equation (2021)    Anion gap 5 5 - 15    Comment: Performed at [facility], [address], [place], [place] [address]   DG Chest Port 1 View  Result Date: 03/23/2023 CLINICAL DATA:  AFib, tachycardia.  Shortness  of breath. EXAM:  PORTABLE CHEST 1 VIEW COMPARISON:  01/09/2023. FINDINGS: The heart is enlarged and the mediastinal contour is stable. There is atherosclerotic calcification of the aorta. A moderate-to-large hiatal hernia is present. No consolidation, effusion, or pneumothorax. No acute osseous abnormality. IMPRESSION: 1. No active disease. 2. Moderate-to-large hiatal hernia. 3. Cardiomegaly. Electronically Signed   By: Thornell Sartorius M.D.   On: 03/23/2023 20:22    Pending Labs Unresulted Labs (From admission, onward)     Start     Ordered   03/25/23 0500  Basic metabolic panel  Daily,   R      03/24/23 0911   03/25/23 0500  Magnesium  Tomorrow morning,   R        03/24/23 0911   03/24/23 0630  Heparin level (unfractionated)  Once-Timed,   URGENT        03/23/23 2257   03/24/23 0500  CBC  Daily,   R      03/23/23 2221            Vitals/Pain Today's Vitals   03/24/23 0434 03/24/23 0700 03/24/23 0715 03/24/23 0800  BP:  (!) 144/86 (!) 147/93 122/79  Pulse: 92 (!) 110 (!) 121 (!) 113  Resp: 14 15 (!) 9 15  Temp: 98 F (36.7 C)   98 F (36.7 C)  TempSrc: Oral     SpO2: 96% 92% 98% 97%  Weight:      Height:      PainSc:        Isolation Precautions No active isolations  Medications Medications  diltiazem (CARDIZEM) 1 mg/mL load via infusion 20 mg (20 mg Intravenous Bolus from Bag 03/23/23 1957)    And  diltiazem (CARDIZEM) 125 mg in dextrose 5% 125 mL (1 mg/mL) infusion (12.5 mg/hr Intravenous New Bag/Given 03/24/23 0438)  metoprolol tartrate (LOPRESSOR) tablet 25 mg (25 mg Oral Given 03/24/23 1007)  heparin ADULT infusion 100 units/mL (25000 units/261mL) (0 Units/hr Intravenous Stopped 03/24/23 1012)  atorvastatin (LIPITOR) tablet 80 mg (80 mg Oral Given 03/24/23 1009)  divalproex (DEPAKOTE ER) 24 hr tablet 500 mg (500 mg Oral Given 03/24/23 1016)  levothyroxine (SYNTHROID) tablet 50 mcg (50 mcg Oral Given 03/24/23 0851)  pantoprazole (PROTONIX) EC tablet 40 mg (40 mg Oral Given 03/24/23 1009)   magnesium oxide (MAG-OX) tablet 400 mg (400 mg Oral Given 03/24/23 0221)  polyvinyl alcohol (LIQUIFILM TEARS) 1.4 % ophthalmic solution 1 drop (has no administration in time range)  acetaminophen (TYLENOL) tablet 650 mg (650 mg Oral Given 03/24/23 1016)  ondansetron (ZOFRAN) injection 4 mg (4 mg Intravenous Given 03/24/23 0221)  promethazine (PHENERGAN) 12.5 mg in sodium chloride 0.9 % 50 mL IVPB (has no administration in time range)  apixaban (ELIQUIS) tablet 5 mg (5 mg Oral Given 03/24/23 1009)  gabapentin (NEURONTIN) capsule 300 mg (300 mg Oral Given 03/24/23 1028)  gabapentin (NEURONTIN) capsule 600 mg (has no administration in time range)  sodium chloride 0.9 % bolus 500 mL (0 mLs Intravenous Stopped 03/23/23 2115)  heparin bolus via infusion 3,500 Units (3,500 Units Intravenous Bolus from Bag 03/23/23 2235)  hydrALAZINE (APRESOLINE) tablet 25 mg (25 mg Oral Given 03/23/23 2323)  potassium chloride SA (KLOR-CON M) CR tablet 40 mEq (40 mEq Oral Given 03/24/23 1009)    Mobility walks

## 2023-03-24 NOTE — ED Notes (Signed)
While administering medications, patient made this nurse aware that when she blew her nose she noticed some blood in it. Notified MD and pharmacy about this, just to make everyone aware. Patient is not currently having any nosebleed or dripping of blood. BP stable.

## 2023-03-24 NOTE — Assessment & Plan Note (Signed)
 Cont Statin. ?

## 2023-03-25 DIAGNOSIS — I4891 Unspecified atrial fibrillation: Secondary | ICD-10-CM | POA: Diagnosis not present

## 2023-03-25 LAB — MAGNESIUM: Magnesium: 2.2 mg/dL (ref 1.7–2.4)

## 2023-03-25 LAB — CBC
HCT: 32.6 % — ABNORMAL LOW (ref 36.0–46.0)
Hemoglobin: 11 g/dL — ABNORMAL LOW (ref 12.0–15.0)
MCH: 31.9 pg (ref 26.0–34.0)
MCHC: 33.7 g/dL (ref 30.0–36.0)
MCV: 94.5 fL (ref 80.0–100.0)
Platelets: 150 10*3/uL (ref 150–400)
RBC: 3.45 MIL/uL — ABNORMAL LOW (ref 3.87–5.11)
RDW: 14.1 % (ref 11.5–15.5)
WBC: 4.6 10*3/uL (ref 4.0–10.5)
nRBC: 0 % (ref 0.0–0.2)

## 2023-03-25 LAB — BASIC METABOLIC PANEL
Anion gap: 7 (ref 5–15)
BUN: 21 mg/dL (ref 8–23)
CO2: 25 mmol/L (ref 22–32)
Calcium: 9.6 mg/dL (ref 8.9–10.3)
Chloride: 102 mmol/L (ref 98–111)
Creatinine, Ser: 1.02 mg/dL — ABNORMAL HIGH (ref 0.44–1.00)
GFR, Estimated: 56 mL/min — ABNORMAL LOW (ref >60)
Glucose, Bld: 87 mg/dL (ref 70–99)
Potassium: 4.4 mmol/L (ref 3.5–5.1)
Sodium: 134 mmol/L — ABNORMAL LOW (ref 135–145)

## 2023-03-25 MED ORDER — DILTIAZEM HCL ER COATED BEADS 180 MG PO CP24
180.0000 mg | ORAL_CAPSULE | Freq: Every day | ORAL | Status: DC
  Administered 2023-03-25: 180 mg via ORAL
  Filled 2023-03-25: qty 1

## 2023-03-25 MED ORDER — OXYMETAZOLINE HCL 0.05 % NA SOLN: 1.0000 | Freq: Two times a day (BID) | NASAL | 0 refills | Status: DC

## 2023-03-25 MED ORDER — DILTIAZEM HCL ER COATED BEADS 180 MG PO CP24: 180.0000 mg | ORAL_CAPSULE | Freq: Every day | ORAL | Status: DC

## 2023-03-25 MED ORDER — APIXABAN 5 MG PO TABS: 5.0000 mg | ORAL_TABLET | Freq: Two times a day (BID) | ORAL | 2 refills | Status: DC

## 2023-03-25 MED ORDER — SALINE SPRAY 0.65 % NA SOLN: 1.0000 | NASAL | 0 refills | Status: AC | PRN

## 2023-03-25 MED ORDER — DILTIAZEM HCL ER COATED BEADS 180 MG PO CP24: 180.0000 mg | ORAL_CAPSULE | Freq: Every day | ORAL | 2 refills | Status: DC

## 2023-03-25 MED ORDER — METOPROLOL TARTRATE 25 MG PO TABS: 25.0000 mg | ORAL_TABLET | Freq: Two times a day (BID) | ORAL | 2 refills | Status: DC

## 2023-03-25 NOTE — Progress Notes (Signed)
DAILY PROGRESS NOTE   Patient Name: Jillian Rodgers Date of Encounter: 03/25/2023 Cardiologist: [name], MD  Chief Complaint   Feels well today  Patient Profile   77 yo female with history of paroxysmal atrial fibrillation, hypertension, CKD 3B and dyslipidemia.  She now presents with A-fib and rapid ventricular response.   Subjective   Converted to sinus rhythm yesterday-had some mild epistaxis, but platelets are stable. H/H stable. Maintained sinus rhythm overnight.  Objective   Vitals:   03/24/23 2200 03/24/23 2348 03/25/23 0343 03/25/23 0526  BP: 126/82 139/85 (!) 139/93 (!) 162/98  Pulse: 62 63 63   Resp:  13 16   Temp:  98.2 F (36.8 C) 98.1 F (36.7 C)   TempSrc:  Oral Oral   SpO2:  91% 93%   Weight:      Height:        Intake/Output Summary (Last 24 hours) at 03/25/2023 0820 Last data filed at 03/25/2023 0545 Gross per 24 hour  Intake 614.7 ml  Output 650 ml  Net -35.3 ml   Filed Weights   03/23/23 1946 03/24/23 1218  Weight: 72.8 kg 70.7 kg    Physical Exam   General appearance: alert and no distress Lungs: clear to auscultation bilaterally Heart: regular rate and rhythm Extremities: extremities normal, atraumatic, no cyanosis or edema Neurologic: Grossly normal  Inpatient Medications    Scheduled Meds:  apixaban  5 mg Oral BID   atorvastatin  80 mg Oral Daily   diltiazem  60 mg Oral Q8H   divalproex  500 mg Oral Daily   gabapentin  300 mg Oral Daily   gabapentin  600 mg Oral QHS   levothyroxine  50 mcg Oral Q0600   magnesium oxide  400 mg Oral QHS   metoprolol tartrate  25 mg Oral BID   oxymetazoline  1 spray Each Nare BID   pantoprazole  40 mg Oral BID    Continuous Infusions:  promethazine (PHENERGAN) injection (IM or IVPB)      PRN Meds: acetaminophen, ondansetron (ZOFRAN) IV, polyvinyl alcohol, promethazine (PHENERGAN) injection (IM or IVPB), sodium chloride   Labs   Results for orders placed or performed  during the hospital encounter of 03/23/23 (from the past 48 hour(s))  Basic metabolic panel     Status: Abnormal   Collection Time: 03/23/23  7:55 PM  Result Value Ref Range   Sodium 133 (L) 135 - 145 mmol/L   Potassium 3.4 (L) 3.5 - 5.1 mmol/L   Chloride 101 98 - 111 mmol/L   CO2 24 22 - 32 mmol/L   Glucose, Bld 115 (H) 70 - 99 mg/dL    Comment: Glucose reference range applies only to samples taken after fasting for at least 8 hours.   BUN 32 (H) 8 - 23 mg/dL   Creatinine, Ser 2.84 (H) 0.44 - 1.00 mg/dL   Calcium 9.4 8.9 - 13.2 mg/dL   GFR, Estimated 51 (L) >60 mL/min    Comment: (NOTE) Calculated using the CKD-EPI Creatinine Equation (2021)    Anion gap 8 5 - 15    Comment: Performed at [facility], [address] [place], [place] [address]  Magnesium     Status: None   Collection Time: 03/23/23  7:55 PM  Result Value Ref Range   Magnesium 2.2 1.7 - 2.4 mg/dL    Comment: Performed at [facility], [address], [place], [place] [address]  CBC  Status: Abnormal   Collection Time: 03/23/23  7:55 PM  Result Value Ref Range   WBC 5.3 4.0 - 10.5 K/uL   RBC 3.31 (L) 3.87 - 5.11 MIL/uL   Hemoglobin 10.8 (L) 12.0 - 15.0 g/dL   HCT 69.6 (L) 29.5 - 28.4 %   MCV 96.4 80.0 - 100.0 fL   MCH 32.6 26.0 - 34.0 pg   MCHC 33.9 30.0 - 36.0 g/dL   RDW 13.2 44.0 - 10.2 %   Platelets 155 150 - 400 K/uL    Comment: REPEATED TO VERIFY   nRBC 0.0 0.0 - 0.2 %    Comment: Performed at [facility], [address] [place], [place] [address]  TSH     Status: None   Collection Time: 03/23/23  7:55 PM  Result Value Ref Range   TSH 3.248 0.350 - 4.500 uIU/mL    Comment: Performed by a 3rd Generation assay with a functional sensitivity of <=0.01 uIU/mL. Performed at [facility], [address] [place], [place] [address]   CBC     Status: Abnormal   Collection Time: 03/24/23  4:45 AM  Result Value Ref Range   WBC  5.6 4.0 - 10.5 K/uL   RBC 3.59 (L) 3.87 - 5.11 MIL/uL   Hemoglobin 11.6 (L) 12.0 - 15.0 g/dL   HCT 47.4 (L) 25.9 - 56.3 %   MCV 93.9 80.0 - 100.0 fL   MCH 32.3 26.0 - 34.0 pg   MCHC 34.4 30.0 - 36.0 g/dL   RDW 87.5 64.3 - 32.9 %   Platelets 149 (L) 150 - 400 K/uL   nRBC 0.0 0.0 - 0.2 %    Comment: Performed at [facility], [address] [place], [place] [address]  Basic metabolic panel     Status: Abnormal   Collection Time: 03/24/23  4:45 AM  Result Value Ref Range   Sodium 131 (L) 135 - 145 mmol/L   Potassium 3.7 3.5 - 5.1 mmol/L   Chloride 103 98 - 111 mmol/L   CO2 23 22 - 32 mmol/L   Glucose, Bld 136 (H) 70 - 99 mg/dL    Comment: Glucose reference range applies only to samples taken after fasting for at least 8 hours.   BUN 24 (H) 8 - 23 mg/dL   Creatinine, Ser 1.66 0.44 - 1.00 mg/dL   Calcium 8.8 (L) 8.9 - 10.3 mg/dL   GFR, Estimated >06 >30 mL/min    Comment: (NOTE) Calculated using the CKD-EPI Creatinine Equation (2021)    Anion gap 5 5 - 15    Comment: Performed at [facility], [address], [place], [place] [address]  Heparin level (unfractionated)     Status: Abnormal   Collection Time: 03/24/23  1:52 PM  Result Value Ref Range   Heparin Unfractionated >1.10 (H) 0.30 - 0.70 IU/mL    Comment: (NOTE) The clinical reportable range upper limit is being lowered to >1.10 to align with the FDA approved guidance for the current laboratory assay.  If heparin results are below expected values, and patient dosage has  been confirmed, suggest follow up testing of antithrombin III levels. Performed at [facility] [place] [facility], [address] [place], [place] [address]   CBC     Status: Abnormal   Collection Time: 03/25/23  5:13 AM  Result Value Ref Range   WBC 4.6 4.0 - 10.5 K/uL   RBC 3.45 (L) 3.87 - 5.11 MIL/uL  Hemoglobin 11.0 (L) 12.0 - 15.0 g/dL   HCT 25.4 (L) 27.0 - 62.3 %   MCV 94.5 80.0 - 100.0 fL   MCH 31.9  26.0 - 34.0 pg   MCHC 33.7 30.0 - 36.0 g/dL   RDW 76.2 83.1 - 51.7 %   Platelets 150 150 - 400 K/uL   nRBC 0.0 0.0 - 0.2 %    Comment: Performed at [facility], [address] [place], [place] [address]  Basic metabolic panel     Status: Abnormal   Collection Time: 03/25/23  5:13 AM  Result Value Ref Range   Sodium 134 (L) 135 - 145 mmol/L   Potassium 4.4 3.5 - 5.1 mmol/L   Chloride 102 98 - 111 mmol/L   CO2 25 22 - 32 mmol/L   Glucose, Bld 87 70 - 99 mg/dL    Comment: Glucose reference range applies only to samples taken after fasting for at least 8 hours.   BUN 21 8 - 23 mg/dL   Creatinine, Ser 3.71 (H) 0.44 - 1.00 mg/dL   Calcium 9.6 8.9 - 06.2 mg/dL   GFR, Estimated 56 (L) >60 mL/min    Comment: (NOTE) Calculated using the CKD-EPI Creatinine Equation (2021)    Anion gap 7 5 - 15    Comment: Performed at [facility], [address], [place], [place] [address]  Magnesium     Status: None   Collection Time: 03/25/23  5:13 AM  Result Value Ref Range   Magnesium 2.2 1.7 - 2.4 mg/dL    Comment: Performed at [facility], [address] [place], [place] [address]    ECG   Sinus rhythm at 74 (12/7) - Personally Reviewed  Telemetry   Sinus rhythm - Personally Reviewed  Radiology    DG Chest Port 1 View  Result Date: 03/23/2023 CLINICAL DATA:  AFib, tachycardia.  Shortness of breath. EXAM: PORTABLE CHEST 1 VIEW COMPARISON:  01/09/2023. FINDINGS: The heart is enlarged and the mediastinal contour is stable. There is atherosclerotic calcification of the aorta. A moderate-to-large hiatal hernia is present. No consolidation, effusion, or pneumothorax. No acute osseous abnormality. IMPRESSION: 1. No active disease. 2. Moderate-to-large hiatal hernia. 3. Cardiomegaly. Electronically Signed   By: Thornell Sartorius M.D.   On: 03/23/2023 20:22    Cardiac Studies   N/A  Assessment   Principal Problem:   Atrial fibrillation  with RVR (HCC) Active Problems:   Hyperlipidemia   Hypothyroidism   CKD stage 3a, GFR 45-59 ml/min (HCC) - baseline SCr 1.1-1.4   Thrombocytopenia (HCC)   Primary hypertension   Plan   Spontaneously converted to sinus rhythm yesterday. H/H and platelets are stable - had mild epistaxis. Agree with nasal hydration. She had reduced metoprolol from 50 mg to 25 mg twice daily for fatigue. Will continue that dose but add cardizem LA 180 mg daily. Continue Eliquis 5 mg BID - monitor for S/S of bleeding. Keep appt with Dr. Bjorn Pippin in January.  Ok to d/c home today.  Time Spent Directly with Patient:  I have spent a total of 25 minutes with the patient reviewing hospital notes, telemetry, EKGs, labs and examining the patient as well as establishing an assessment and plan that was discussed personally with the patient.  > 50% of time was spent in direct patient care.  Length of Stay:  LOS: 0 days   Chrystie Nose, MD, [facility], FACP  [place]  [facility]  Medical Director of  the [facility] Lip[facility]  Cardiovascular Risk Reduction Clinic Diplomate of the American Board of Clinical Lipidology Attending Cardiologist  Direct Dial: [phone]  Fax: [phone]  Website:  [email or web address]  Lisette Abu Janith Nielson 03/25/2023, 8:20 AM

## 2023-03-25 NOTE — TOC Initial Note (Signed)
Transition of Care [facility]) - Initial/Assessment Note    Patient Details  Name: Jillian Rodgers MRN: [id number] Date of Birth: [birth year]  [facility]) CM/SW Contact:    Adrian Prows, RN Phone Number: 03/25/2023, 11:14 AM  Clinical Narrative:                   Expected Discharge Plan: Home/Self Care Barriers to Discharge: No Barriers Identified   Patient Goals and CMS Choice Patient states their goals for this hospitalization and ongoing recovery are:: home CMS Medicare.gov Compare Post Acute Care list provided to:: Patient        Expected Discharge Plan and Services   Discharge Planning Services: CM Consult Post Acute Care Choice: NA Living arrangements for the past 2 months: Single Family Home Expected Discharge Date: 03/25/23               DME Arranged: N/A DME Agency: NA       HH Arranged: NA HH Agency: NA        Prior Living Arrangements/Services Living arrangements for the past 2 months: Single Family Home Lives with:: Self Patient language and need for interpreter reviewed:: Yes Do you feel safe going back to the place where you live?: Yes      Need for Family Participation in Patient Care: Yes (Comment) Care giver support system in place?: Yes (comment) Current home services:  (n/a) Criminal Activity/Legal Involvement Pertinent to Current Situation/Hospitalization: No - Comment as needed  Activities of Daily Living   ADL Screening (condition at time of admission) Independently performs ADLs?: Yes (appropriate for developmental age) Is the patient deaf or have difficulty hearing?: No Does the patient have difficulty seeing, even when wearing glasses/contacts?: No Does the patient have difficulty concentrating, remembering, or making decisions?: No  Permission Sought/Granted Permission sought to share information with : Case Manager Permission granted to share information with : Yes, Verbal Permission Granted  Share Information  with NAME: Case Manager     Permission granted to share info w Relationship: Ginger Jetton (spouse) [phone]     Emotional Assessment Appearance:: Appears stated age Attitude/Demeanor/Rapport: Gracious Affect (typically observed): Accepting Orientation: : Oriented to Self, Oriented to Place, Oriented to  Time, Oriented to Situation Alcohol / Substance Use: Not Applicable Psych Involvement: No (comment)  Admission diagnosis:  Atrial fibrillation with RVR (HCC) [I48.91] Patient Active Problem List   Diagnosis Date Noted   Hypokalemia 01/11/2023   Acute cystitis without hematuria 01/10/2023   History of TIA (transient ischemic attack) 01/10/2023   Atrial fibrillation with RVR (HCC) 01/09/2023   TIA (transient ischemic attack) 10/17/2022   Primary hypertension 04/28/2022   Snoring 03/04/2021   Paroxysmal atrial fibrillation (HCC) 11/01/2020   Secondary hypercoagulable state (HCC) 11/01/2020   Increased urinary frequency 10/20/2020   Localized osteoporosis without current pathological fracture 10/20/2020   IDA (iron deficiency anemia) 04/07/2020   Thrombocytopenia (HCC) 11/25/2015   Encounter for Medicare annual wellness exam 06/14/2015   Overweight (BMI 25.0-29.9) 01/28/2015   CKD stage 3a, GFR 45-59 ml/min (HCC) - baseline SCr 1.1-1.4 01/19/2014   Upper airway cough syndrome 12/16/2013   Vitamin D deficiency 05/21/2013   Medication management 05/21/2013   Labile hypertension 01/31/2013   Degenerative joint disease    GERD (gastroesophageal reflux disease)    Hyperlipidemia    Hypothyroidism    PCP:  [name], MD Pharmacy:   [facility] [phone] - [place], [place] - [address] AT [facility] [address] [place] [address] Phone: [phone] Fax: [phone]     Social Determinants of Health (SDOH) Social History: SDOH Screenings   Food Insecurity: No Food Insecurity (03/25/2023)  Housing: Patient Declined  (03/25/2023)  Transportation Needs: No Transportation Needs (03/25/2023)  Utilities: Not At Risk (03/25/2023)  Depression (PHQ2-9): Low Risk  (12/06/2022)  Tobacco Use: Low Risk  (03/23/2023)   SDOH Interventions: Food Insecurity Interventions: Intervention Not Indicated, Inpatient TOC Housing Interventions: Intervention Not Indicated, Inpatient TOC Transportation Interventions: Intervention Not Indicated, Inpatient TOC Utilities Interventions: Intervention Not Indicated, Inpatient TOC   Readmission Risk Interventions    01/11/2023    5:57 PM  Readmission Risk Prevention Plan  Post Dischage Appt Complete  Medication Screening Complete  Transportation Screening Complete

## 2023-03-25 NOTE — Progress Notes (Signed)
AVS printed and reviewed with patient and her husband. RN answered any questions patient had and made sure there were no follow up questions the patient had. The patient's IV and tele were removed. Patient had all her belongings and was being driven home by her husband. Patient rolled down to the entrance in a wheelchair with all belongings & discharge paperwork in hand. Pt A&O x 4, RA, VS stable.

## 2023-03-25 NOTE — Care Management Obs Status (Signed)
MEDICARE OBSERVATION STATUS NOTIFICATION   Patient Details  Name: Jillian Rodgers MRN: [id number] Date of Birth: 08-Dec-1945   Medicare Observation Status Notification Given:  Yes    Adrian Prows, RN 03/25/2023, 10:06 AM

## 2023-03-25 NOTE — Plan of Care (Signed)
  Problem: Safety: Goal: Ability to remain free from injury will improve Outcome: Progressing   Problem: Coping: Goal: Level of anxiety will decrease Outcome: Progressing   Problem: Activity: Goal: Risk for activity intolerance will decrease Outcome: Progressing   Problem: Clinical Measurements: Goal: Cardiovascular complication will be avoided Outcome: Progressing   Problem: Clinical Measurements: Goal: Ability to maintain clinical measurements within normal limits will improve Outcome: Progressing   Problem: Education: Goal: Knowledge of General Education information will improve Description: Including pain rating scale, medication(s)/side effects and non-pharmacologic comfort measures Outcome: Progressing

## 2023-03-25 NOTE — Discharge Summary (Signed)
[facility]  Physician Discharge Summary   Patient ID: MEILIN LICURSI MRN: [id number] DOB/AGE: [id number] y.o.  Admit date: 03/23/2023 Discharge date: 03/25/2023    PCP: [name], MD  DISCHARGE DIAGNOSES:    Atrial fibrillation with RVR (HCC)   Thrombocytopenia/ITP   Hyperlipidemia   Hypothyroidism   CKD stage 3a, GFR 45-59 ml/min (HCC) - baseline SCr 1.1-1.4   Primary hypertension   RECOMMENDATIONS FOR OUTPATIENT FOLLOW UP: Patient instructed to follow-up with her hematologist Patient already has an appointment with her cardiologist in January   Home Health: None Equipment/Devices: None  CODE STATUS: Full code  DISCHARGE CONDITION: fair  Diet recommendation: As before  INITIAL HISTORY: 77 y.o. 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 Pt not on OAC due to recurrent iron def anemia and chronic thrombocytopenia.  Supposed to see cards later this month regarding a Watchman procedure. Pts platelets had been worsening recently, failed an attempt to treat with decadron, so Dr. 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  Presented with palpitations.  Was found to have atrial fibrillation with RVR.  Placed on Cardizem infusion and hospitalized.   Consultants: Cardiology   Procedures: None  HOSPITAL COURSE:   Atrial fibrillation with RVR Known history of same.  Uses metoprolol at home.  Has been compliant with her medications.  Was on Eliquis few months ago but had to be discontinued due to drop in platelet counts. Platelet counts have improved.  Was started on heparin initially and then transitioned to apixaban. Seen by cardiology.  Started back on her beta-blocker at a lower dose.  She subsequently converted to sinus rhythm.  She was transitioned to oral diltiazem.  Will be discharged on diltiazem metoprolol and apixaban.   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  Received IVIG last week.  Platelet counts noted to be normal.  Admitting physician was able to communicate with the hematologist who recommended continuing Eliquis as long as the platelet counts are greater than 50,000. Platelet counts noted to be 150,000 at discharge. Outpatient follow-up.   Essential hypertension Stable   Normocytic anemia No evidence of overt bleeding.    Hyponatremia Monitor daily.   Hypothyroidism Continue with levothyroxine.  TSH is normal.   Hyperlipidemia Continue statin.   Chronic kidney disease stage IIIa Stable  Patient has done well.  Ambulated without difficulties.  Okay for discharge home today.   PERTINENT LABS:  The results of significant diagnostics from this hospitalization (including imaging, microbiology, ancillary and laboratory) are listed below for reference.    Labs:   Basic Metabolic Panel: Recent Labs  Lab 03/23/23 1955 03/24/23 0445 03/25/23 0513  NA 133* 131* 134*  K 3.4* 3.7 4.4  CL 101 103 102  CO2 24 23 25   GLUCOSE 115* 136* 87  BUN 32* 24* 21  CREATININE 1.10* 0.89 1.02*  CALCIUM 9.4 8.8* 9.6  MG 2.2  --  2.2    CBC: Recent Labs  Lab 03/23/23 1104 03/23/23 1955 03/24/23 0445 03/25/23 0513  WBC 4.5 5.3 5.6 4.6  NEUTROABS 2.4  --   --   --   HGB 11.1* 10.8* 11.6* 11.0*  HCT 33.5* 31.9* 33.7* 32.6*  MCV 97.1 96.4 93.9 94.5  PLT 130* 155 149* 150     IMAGING STUDIES DG Chest Port 1 View  Result Date: 03/23/2023 CLINICAL DATA:  AFib, tachycardia.  Shortness of breath. EXAM:  PORTABLE CHEST 1 VIEW COMPARISON:  01/09/2023. FINDINGS: The heart is enlarged and the mediastinal contour is stable. There is atherosclerotic calcification of the aorta. A moderate-to-large hiatal hernia is present. No consolidation, effusion, or pneumothorax. No acute osseous abnormality. IMPRESSION: 1. No active disease. 2. Moderate-to-large hiatal hernia. 3. Cardiomegaly. Electronically  Signed   By: Thornell Sartorius M.D.   On: 03/23/2023 20:22    DISCHARGE EXAMINATION: Vitals:   03/24/23 2348 03/25/23 0343 03/25/23 0526 03/25/23 0800  BP: 139/85 (!) 139/93 (!) 162/98 (!) 143/90  Pulse: 63 63  65  Resp: 13 16  15   Temp: 98.2 F (36.8 C) 98.1 F (36.7 C)  98.2 F (36.8 C)  TempSrc: Oral Oral  Oral  SpO2: 91% 93%  97%  Weight:      Height:       General appearance: Awake alert.  In no distress Resp: Clear to auscultation bilaterally.  Normal effort Cardio: S1-S2 is normal regular.  No S3-S4.  No rubs murmurs or bruit GI: Abdomen is soft.  Nontender nondistended.  Bowel sounds are present normal.  No masses organomegaly   DISPOSITION: Home  Discharge Instructions     Amb referral to AFIB Clinic   Complete by: As directed    Call MD for:  difficulty breathing, headache or visual disturbances   Complete by: As directed    Call MD for:  extreme fatigue   Complete by: As directed    Call MD for:  persistant dizziness or light-headedness   Complete by: As directed    Call MD for:  persistant nausea and vomiting   Complete by: As directed    Call MD for:  severe uncontrolled pain   Complete by: As directed    Call MD for:  temperature >100.4   Complete by: As directed    Diet - low sodium heart healthy   Complete by: As directed    Discharge instructions   Complete by: As directed    Please take your medications as prescribed.  Please seek attention if you notice bleeding from any site such as blood in the stool, black-colored stool, blood in the urine, blood from nostrils.  Seek attention if you feel dizzy or lightheaded or develop chest pain shortness of breath or palpitations.  You were cared for by a hospitalist during your hospital stay. If you have any questions about your discharge medications or the care you received while you were in the hospital after you are discharged, you can call the unit and asked to speak with the hospitalist on call if the  hospitalist that took care of you is not available. Once you are discharged, your primary care physician will handle any further medical issues. Please note that NO REFILLS for any discharge medications will be authorized once you are discharged, as it is imperative that you return to your primary care physician (or establish a relationship with a primary care physician if you do not have one) for your aftercare needs so that they can reassess your need for medications and monitor your lab values. If you do not have a primary care physician, you can call [phone] for a physician referral.   Increase activity slowly   Complete by: As directed          Allergies as of 03/25/2023   No Known Allergies      Medication List     TAKE these medications    apixaban 5 MG Tabs tablet Commonly known as: ELIQUIS  Take 1 tablet (5 mg total) by mouth 2 (two) times daily.   atorvastatin 80 MG tablet Commonly known as: LIPITOR Take 1 tablet (80 mg total) by mouth daily.   Botox 100 units Solr injection Generic drug: botulinum toxin Type A Inject 100 Units into the muscle See admin instructions. Inject 100 units intramuscularly every 6-8 weeks   cetirizine 10 MG tablet Commonly known as: ZYRTEC Take 10 mg by mouth daily.   diltiazem 180 MG 24 hr capsule Commonly known as: CARDIZEM CD Take 1 capsule (180 mg total) by mouth daily.   divalproex 500 MG 24 hr tablet Commonly known as: DEPAKOTE ER Take 1 tablet (500 mg total) by mouth daily.   furosemide 20 MG tablet Commonly known as: LASIX Take 1 tablet (20 mg total) by mouth daily as needed. What changed: reasons to take this   gabapentin 300 MG capsule Commonly known as: NEURONTIN Take 300 mg (1 tablet) in the morning and 600 mg (2 tablets) at bedtime   levothyroxine 50 MCG tablet Commonly known as: SYNTHROID Take  1 tablet  Daily  on an empty stomach with only water for 30 minutes & no Antacid meds, Calcium or Magnesium for 4 hours &  avoid Biotin                                                                                                                    /                                                                   TAKE                                         BY                                                 MOUTH What changed:  how much to take how to take this when to take this additional instructions   Magnesium Oxide 400 MG Caps Take 1 capsule (400 mg total) by mouth at bedtime.   metoprolol tartrate 25 MG tablet Commonly known as: LOPRESSOR Take 1 tablet (25 mg total) by mouth 2 (two) times daily. What changed:  medication strength how much to take   oxymetazoline 0.05 % nasal spray Commonly known as: AFRIN Place 1 spray into both nostrils 2 (two) times daily. For 5 days   pantoprazole 40 MG tablet Commonly known as: PROTONIX Take  1 tablet  2 x /day  for Acid Indigestion & Heartburn  What changed:  how much to take how to take this when to take this additional instructions   sodium chloride 0.65 % Soln nasal spray Commonly known as: OCEAN Place 1 spray into both nostrils as needed for congestion.   Systane Balance 0.6 % Soln Generic drug: Propylene Glycol Place 1 drop into both eyes at bedtime as needed (dry eyes).   Systane Ultra 0.4-0.3 % Soln Generic drug: Polyethyl Glycol-Propyl Glycol Place 1 drop into both eyes daily as needed (dry eyes).   TYLENOL 500 MG tablet Generic drug: acetaminophen Take 500-1,000 mg by mouth every 6 (six) hours as needed for mild pain or headache.   Vitamin C 500 MG Caps Take 500 mg by mouth daily.   Vitamin D3 125 MCG (5000 UT) Tabs Take 5,000 Units by mouth daily.   Zinc 30 MG Tabs Take 30 mg by mouth daily.          Follow-up Information     [name], MD. Schedule an appointment as soon as possible for a visit in 1 week(s).   Specialty: Internal Medicine Why: post hospitalization follow up Contact information: [address] [place] [address] [phone]         [name], MD Follow up.   Specialties: Cardiology, Radiology Contact information: [address] [place] [address] [phone]         Rachel Moulds, MD Follow up.   Specialty: Hematology and Oncology Contact information: [address] [place] [address] [phone]                 TOTAL DISCHARGE TIME: 35 minutes  Hall Birchard Rito Ehrlich  [facility] Pager on [email or web address]  03/26/2023, 10:46 AM

## 2023-03-26 ENCOUNTER — Encounter: Payer: Self-pay | Admitting: Neurology

## 2023-03-26 ENCOUNTER — Ambulatory Visit: Payer: Medicare PPO | Admitting: Neurology

## 2023-03-26 VITALS — BP 128/75 | HR 63 | Ht 64.0 in | Wt 159.0 lb

## 2023-03-26 DIAGNOSIS — G43009 Migraine without aura, not intractable, without status migrainosus: Secondary | ICD-10-CM | POA: Diagnosis not present

## 2023-03-26 DIAGNOSIS — G459 Transient cerebral ischemic attack, unspecified: Secondary | ICD-10-CM | POA: Diagnosis not present

## 2023-03-26 DIAGNOSIS — D693 Immune thrombocytopenic purpura: Secondary | ICD-10-CM

## 2023-03-26 DIAGNOSIS — I480 Paroxysmal atrial fibrillation: Secondary | ICD-10-CM | POA: Diagnosis not present

## 2023-03-26 NOTE — Patient Instructions (Signed)
I had a long d/w patient and her husband about recent episode of TIA versus atypical migraine, paroxysmal A-fib, risk for recurrent stroke/TIAs, personally independently reviewed imaging studies and stroke evaluation results and answered questions.Continue Eliquis for secondary stroke prevention for now but agree she is not a great long-term anticoagulation candidate given history of chronic thrombocytopenia and maintain strict control of hypertension with blood pressure goal below 130/90, diabetes with hemoglobin A1c goal below 6.5% and lipids with LDL cholesterol goal below 70 mg/dL. I also advised the patient to eat a healthy diet with plenty of whole grains, cereals, fruits and vegetables, exercise regularly and maintain ideal body weight .agree with referral to Dr. Lalla Brothers for consideration for Watchman device as she is not a great long-term anticoagulation candidate.  Continue Depakote ER 500 mg daily for migraine and seizure prophylaxis.  Followup in the future with me in 6 months or call earlier if necessary.  Left Atrial Appendage Closure Device Implantation  Left atrial appendage (LAA) closure device implantation is a procedure to put a small device in the LAA of the heart. The LAA is a small sac in the wall of the heart's top left chamber. Blood clots can form in the LAA in people with atrial fibrillation (AFib). AFib is a type of irregular or rapid heartbeat (arrhythmia). The device closes the LAA to help prevent blood clots from forming and a potential stroke. Tell a health care provider about: Any allergies you have. All medicines you are taking, including vitamins, herbs, eye drops, creams, and over-the-counter medicines. Any problems you or family members have had with anesthesia. Any bleeding problems you have. Any surgeries you have had. Any medical conditions you have. Whether you are pregnant or may be pregnant. What are the risks? Your health care provider will talk with you about  risks. These may include: Infection. Bleeding. Allergic reactions to medicines or dyes. Damage to nearby structures or organs. Blood clots. Device failure. Other risks include: Heart attack. Changes in heart rhythm. Stroke. What happens before the procedure? Eating and drinking restrictions Follow instructions from your provider about what you may eat and drink. These may include: 8 hours before the procedure Stop eating most foods. Do not eat meat, fried foods, or fatty foods. Eat only light foods, such as toast or crackers. All liquids are okay except energy drinks and alcohol. 6 hours before the procedure Stop eating. Drink only clear liquids, such as water, clear fruit juice, black coffee, plain tea, and sports drinks. Do not drink energy drinks or alcohol. 2 hours before the procedure Stop drinking all liquids. You may be allowed to take medicines with small sips of water. If you do not follow your provider's instructions, your procedure may be delayed or canceled. Medicines Ask your provider about: Changing or stopping your regular medicines. These include any diabetes medicines or blood thinners you take. Taking medicines such as aspirin and ibuprofen. These medicines can thin your blood. Do not take them unless your provider tells you to. Taking over-the-counter medicines, vitamins, herbs, and supplements. Tests You may have a physical exam. You may have other tests, including: Blood tests. An electrocardiogram (ECG). This checks your heart's electrical patterns and rhythms. An echocardiogram. This looks at how well the heart is pumping. General instructions Do not use any products that contain nicotine or tobacco. These products include cigarettes, chewing tobacco, and vaping devices, such as e-cigarettes. If you need help quitting, ask your provider. Ask your provider what steps will be taken to  help prevent infection. These steps may include: Removing hair at the  surgery site. Washing skin with a soap that kills germs. Taking antibiotics. If you will be going home right after the procedure, plan to have a responsible adult: Take you home from the hospital or clinic. You will not be allowed to drive. Care for you for the time you are told. What happens during the procedure? An IV will be inserted into one of your veins. You will be given: A sedative. This helps you relax. Anesthesia. This keeps you from feeling pain. It will make you fall asleep for surgery. An incision will be made in a large blood vessel in your groin. A soft tube (catheter) will be put through the incision and moved up through the blood vessel to reach your heart. Dye may be injected so X-rays (fluoroscopy) can be used to guide the catheter through the blood vessel. The closure device will be moved through the catheter and into your heart. The device will be placed so that it closes the LAA. X-rays will be done to make sure the device is in the right place. The catheter will be removed. The closure device will stay in your heart. After pressure is applied over the catheter insertion site to prevent bleeding, a bandage (dressing) will be placed over the area. The procedure may vary among providers and hospitals. What happens after the procedure? Your blood pressure, heart rate, breathing rate, and blood oxygen level will be monitored until you leave the hospital or clinic. You will need to lie flat for a few hours or as told by your provider. Keep your legs straight. Do not bend or cross your legs. You may be given pain medicine. You may need to drink more fluids to flush the dye out of your body. Drink enough fluid to keep your pee (urine) pale yellow. If you were given a sedative during the procedure, it can affect you for several hours. Do not drive or operate machinery until your provider says that it is safe. This information is not intended to replace advice given to you by  your health care provider. Make sure you discuss any questions you have with your health care provider. Document Revised: 11/09/2021 Document Reviewed: 11/09/2021 Elsevier Patient Education  2024 [employer].

## 2023-03-26 NOTE — Progress Notes (Signed)
[facility] [address] [place]. [place] [address] [phone]       OFFICE FOLLOW-UP NOTE  Jillian Rodgers Date of Birth:  December 04, 1945 Medical Record Number:  [id number]   HPI: Jillian Rodgers is a pleasant 77 year old Caucasian lady seen today for initial office follow-up visit following hospital consultation for TIA July 2024.  She is accompanied by her husband today.  History is obtained from them and review of electronic medical records.  I personally reviewed pertinent available imaging films in PACS.  She has past medical history of chronic thrombocytopenia, gastroesophageal reflux disease, hypertension, hyperlipidemia, paroxysmal A-fib not on anticoagulation and hypothyroidism.  She presented on 10/18/2022 with a 10 to 15-minute episode of speech difficulties and lightheadedness.  She was at home and had just walked to the kitchen when she knew what she wanted to say but had trouble getting the words out and her speech did not make sense.  She also felt slightly confused but this resolved in 10 to 15 minutes.  She states she had this somewhat similar episode a few months ago when she had trouble writing when she was checking out from her doctor's office visit.  The staff is trying to set her up for a follow-up appointment so she looked at her calendar but could not make sense of what she was doing.  This lasted only few minutes.  She did have a mild headache during her more recent episode but not for the initial 1.  She denies prior history of migraines.  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  She has not been on seizure medications and not had any follow-up seizures since then.  She does have a history of paroxysmal A-fib she reports that at some time she was on Eliquis however she was taken off it after cardiology was discussed with with  her hematologist because of her low platelet count.  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  ROS:   14 system review of systems is positive for bruising, confusion, headache, speech and word finding difficulties and all other systems negative  PMH:  Past Medical History:  Diagnosis Date   Anemia with low platelet count (HCC)    Chronic kidney disease    Degenerative joint disease    Dysrhythmia    a fib   GERD (gastroesophageal reflux disease)    History of kidney stones    Hyperlipidemia    Hypothyroidism    Inguinal hernia 02/19/2019   Labile hypertension    Migraines    neurontin helps   Pneumonia    hx of walking pneumonia    Primary hypertension 04/28/2022    Social History:  Social History   Socioeconomic History   Marital status: Married    Spouse name: Not on file   Number of children: 2   Years of education: 18   Highest education level: Not on file  Occupational History   Not on file  Tobacco Use   Smoking status: Never   Smokeless tobacco: Never  Vaping Use   Vaping status: Never Used  Substance and Sexual Activity   Alcohol use: No   Drug use: No   Sexual activity: Yes  Other Topics Concern   Not on file  Social History Narrative   Live with husband    Retired    International aid/development worker of [employer] Strain: Not on file  Food Insecurity: No Food Insecurity (03/25/2023)   Hunger Vital Sign    Worried About Running Out of Food in the Last Year: Never true    Ran Out of Food in the Last Year: Never true  Transportation Needs: No Transportation Needs (03/25/2023)   PRAPARE - Administrator, Civil Service (Medical): No    Lack of Transportation (Non-Medical): No  Physical Activity: Not on file  Stress: Not on file  Social Connections: Not on file  Intimate Partner Violence: Not At Risk  (03/25/2023)   Humiliation, Afraid, Rape, and Kick questionnaire    Fear of Current or Ex-Partner: No    Emotionally Abused: No    Physically Abused: No    Sexually Abused: No    Medications:   Current Outpatient Medications on File Prior to Visit  Medication Sig Dispense Refill   apixaban (ELIQUIS) 5 MG TABS tablet Take 1 tablet (5 mg total) by mouth 2 (two) times daily. 60 tablet 2   Ascorbic Acid (VITAMIN C) 500 MG CAPS Take 500 mg by mouth daily.      atorvastatin (LIPITOR) 80 MG tablet Take 1 tablet (80 mg total) by mouth daily. 30 tablet 3   BOTOX 100 units SOLR injection Inject 100 Units into the muscle See admin instructions. Inject 100 units intramuscularly every 6-8 weeks     cetirizine (ZYRTEC) 10 MG tablet Take 10 mg by mouth daily.     Cholecalciferol (VITAMIN D3) 125 MCG (5000 UT) TABS Take 5,000 Units by mouth daily.     diltiazem (CARDIZEM CD) 180 MG 24 hr capsule Take 1 capsule (180 mg total) by mouth daily. 30 capsule 2   divalproex (DEPAKOTE ER) 500 MG 24 hr tablet Take 1 tablet (500 mg total) by mouth daily. 30 tablet 3   furosemide (LASIX) 20 MG tablet Take 1 tablet (20 mg total) by mouth daily as needed. (Patient taking differently: Take 20 mg by mouth as needed for fluid or edema.) 30 tablet 2   gabapentin (NEURONTIN) 300 MG capsule Take 300 mg (1 tablet) in the morning and 600 mg (2 tablets) at bedtime 90 capsule 3   levothyroxine (SYNTHROID) 50 MCG tablet Take  1 tablet  Daily  on an empty stomach with only water for 30 minutes & no Antacid meds, Calcium or Magnesium for 4 hours & avoid Biotin                                                                                                                    /  TAKE                                         BY                                                 MOUTH (Patient taking differently: Take 50 mcg by mouth See admin instructions. Take 50 mcg by mouth in  the morning on an empty stomach, with only water. Take no antacids, calcium, or magnesium for 4 hours and avoid biotin.) 90 tablet 3   Magnesium Oxide 400 MG CAPS Take 1 capsule (400 mg total) by mouth at bedtime.     metoprolol tartrate (LOPRESSOR) 25 MG tablet Take 1 tablet (25 mg total) by mouth 2 (two) times daily. 60 tablet 2   oxymetazoline (AFRIN) 0.05 % nasal spray Place 1 spray into both nostrils 2 (two) times daily. For 5 days 15 mL 0   pantoprazole (PROTONIX) 40 MG tablet Take  1 tablet  2 x /day  for Acid Indigestion & Heartburn (Patient taking differently: Take 40 mg by mouth in the morning and at bedtime.) 180 tablet 3   sodium chloride (OCEAN) 0.65 % SOLN nasal spray Place 1 spray into both nostrils as needed for congestion. 15 mL 0   SYSTANE BALANCE 0.6 % SOLN Place 1 drop into both eyes at bedtime as needed (dry eyes).     SYSTANE ULTRA 0.4-0.3 % SOLN Place 1 drop into both eyes daily as needed (dry eyes).     TYLENOL 500 MG tablet Take 500-1,000 mg by mouth every 6 (six) hours as needed for mild pain or headache.     Zinc 30 MG TABS Take 30 mg by mouth daily.     No current facility-administered medications on file prior to visit.    Allergies:  No Known Allergies  Physical Exam General: Pleasant elderly Caucasian lady, seated, in no evident distress Head: head normocephalic and atraumatic.  Neck: supple with no carotid or supraclavicular bruits Cardiovascular: regular rate and rhythm, no murmurs Musculoskeletal: no deformity Skin:  no rash/petichiae Vascular:  Normal pulses all extremities Vitals:   03/26/23 1113  BP: 128/75  Pulse: 63   Neurologic Exam Mental Status: Awake and fully alert. Oriented to place and time. Recent and remote memory intact. Attention span, concentration and fund of knowledge appropriate. Mood and affect appropriate.  Cranial Nerves: Fundoscopic exam reveals sharp disc margins. Pupils equal, briskly reactive to light. Extraocular movements  full without nystagmus. Visual fields full to confrontation. Hearing intact. Facial sensation intact. Face, tongue, palate moves normally and symmetrically.  Motor: Normal bulk and tone. Normal strength in all tested extremity muscles. Sensory.: intact to touch ,pinprick .position and vibratory sensation.  Coordination: Rapid alternating movements normal in all extremities. Finger-to-nose and heel-to-shin performed accurately bilaterally. Gait and Station: Arises from chair without difficulty. Stance is normal. Gait demonstrates normal stride length and balance . Able to heel, toe and tandem walk with moderate difficulty.  Reflexes: 1+ and symmetric. Toes downgoing.   NIHSS  0 Modified Rankin  1   ASSESSMENT: 77 year old patient with transient episode of speech difficulties and confusion possible left hemispheric TIA versus atypical migraine.  She has history of paroxysmal A-fib and chronic thrombocytopenia and hence  is not a good long-term anticoagulation candidate.     PLAN:I had a long d/w patient and her husband about recent episode of TIA versus atypical migraine, paroxysmal A-fib, risk for recurrent stroke/TIAs, personally independently reviewed imaging studies and stroke evaluation results and answered questions.Continue Eliquis for secondary stroke prevention for now but agree she is not a great long-term anticoagulation candidate given history of chronic thrombocytopenia and maintain strict control of hypertension with blood pressure goal below 130/90, diabetes with hemoglobin A1c goal below 6.5% and lipids with LDL cholesterol goal below 70 mg/dL. I also advised the patient to eat a healthy diet with plenty of whole grains, cereals, fruits and vegetables, exercise regularly and maintain ideal body weight .agree with referral to Dr. Lalla Brothers for consideration for Watchman device as she is not a great long-term anticoagulation candidate.  Continue Depakote ER 500 mg daily for migraine and  seizure prophylaxis.  Followup in the future with me in 6 months or call earlier if necessary.  Greater than 50% of time during this 45 minute visit was spent on counseling,explanation of diagnosis of atrial fibrillation, intracerebral hemorrhage, risk-benefit of anticoagulation, planning of further management, discussion with patient and family and coordination of care Delia Heady, MD Note: This document was prepared with digital dictation and possible smart phrase technology. Any transcriptional errors that result from this process are unintentional

## 2023-03-27 ENCOUNTER — Encounter: Payer: Self-pay | Admitting: Hematology and Oncology

## 2023-03-27 NOTE — Assessment & Plan Note (Signed)
Will repeat CBC today to evaluate platelet status.  I will call her with her results and make f/u recommendations once we have those results.

## 2023-03-28 ENCOUNTER — Encounter: Payer: Self-pay | Admitting: Nurse Practitioner

## 2023-03-28 ENCOUNTER — Ambulatory Visit (INDEPENDENT_AMBULATORY_CARE_PROVIDER_SITE_OTHER): Payer: Medicare PPO | Admitting: Nurse Practitioner

## 2023-03-28 VITALS — BP 106/68 | HR 62 | Temp 98.0°F | Resp 18 | Ht 62.5 in | Wt 162.8 lb

## 2023-03-28 DIAGNOSIS — D696 Thrombocytopenia, unspecified: Secondary | ICD-10-CM | POA: Diagnosis not present

## 2023-03-28 DIAGNOSIS — I4891 Unspecified atrial fibrillation: Secondary | ICD-10-CM | POA: Diagnosis not present

## 2023-03-28 DIAGNOSIS — Z09 Encounter for follow-up examination after completed treatment for conditions other than malignant neoplasm: Secondary | ICD-10-CM | POA: Diagnosis not present

## 2023-03-28 DIAGNOSIS — Z79899 Other long term (current) drug therapy: Secondary | ICD-10-CM

## 2023-03-28 NOTE — Progress Notes (Signed)
Hospital follow up  Assessment and Plan: Hospital visit follow up for:   Hospital discharge follow-up (Primary) Reviewed discharge instructions in full including medication changes, diagnostics, labs, and future follow ups appointment. All questions and concerns addressed.   Atrial fibrillation with RVR (HCC) Continue to follow with [facility] Considering Watchman's procedure  Thrombocytopenia (HCC) Continue to follow with Dr. Al Pimple  Medication management All medications discussed and reviewed in full. All questions and concerns regarding medications addressed.    All medications were reviewed with patient and family and fully reconciled. All questions answered fully, and patient and family members were encouraged to call the office with any further questions or concerns. Discussed goal to avoid readmission related to this diagnosis.   Over 30 minutes of exam, counseling, chart review, and complex, high/moderate level critical decision making was performed this visit.   Future Appointments  Date Time Provider Department Center  04/03/2023  3:00 PM [facility] None  04/03/2023  3:30 PM Rachel Moulds, MD [facility] None  04/17/2023 10:30 AM Lanier Prude, MD [facility]  04/23/2023 11:40 AM [name], MD [facility] None  05/08/2023  4:00 PM [facility] DX DEXA 1 [facility]  07/04/2023  2:30 PM Adela Glimpse, NP [facility] None  10/09/2023  2:00 PM [name], MD [facility] None  11/07/2023  3:00 PM Micki Riley, MD [facility] None     HPI 77 y.o.female presents for follow up for transition from recent hospitalization. Admit date to the hospital was 03/23/23, patient was discharged from the hospital on 03/25/23 and our clinical staff contacted the office the day after discharge to set up a follow up appointment. The discharge summary, medications, and diagnostic test results were reviewed before meeting with  the patient. The patient was admitted for low platelets.   Pts platelets had been worsening recently, failed an attempt to treat with decadron, so Dr. 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 Presented with palpitations. Was found to have atrial fibrillation with RVR. Placed on Cardizem infusion and hospitalized.   Pt not on OAC due to recurrent iron def anemia and chronic thrombocytopenia. Supposed to see cards later this month regarding a Watchman procedure.   Known history of same.  Uses metoprolol at home.  Has been compliant with her medications.  Was on Eliquis few months ago but had to be discontinued due to drop in platelet counts. Platelet counts have improved.  Was started on heparin initially and then transitioned to apixaban. Seen by cardiology.  Started back on her beta-blocker at a lower dose.  She subsequently converted to sinus rhythm.  She was transitioned to oral diltiazem.  Will be discharged on diltiazem metoprolol and apixaban.  Followed by hematology.  Received IVIG last week.  Platelet counts noted to be normal.  Admitting physician was able to communicate with the hematologist who recommended continuing Eliquis as long as the platelet counts are greater than 50,000. Platelet counts noted to be 150,000 at discharge. Outpatient follow-up.  Home health is not involved.   Images while in the hospital: [facility] Chest Port 1 View  Result Date: 03/23/2023 CLINICAL DATA:  AFib, tachycardia.  Shortness of breath. EXAM: PORTABLE CHEST 1 VIEW COMPARISON:  01/09/2023. FINDINGS: The heart is enlarged and the mediastinal contour is stable. There is atherosclerotic calcification of the aorta. A moderate-to-large hiatal hernia is present. No consolidation, effusion, or pneumothorax. No acute osseous abnormality. IMPRESSION: 1. No active disease. 2. Moderate-to-large hiatal hernia.  3. Cardiomegaly. Electronically Signed   By: Thornell Sartorius M.D.   On: 03/23/2023 20:22      Current Outpatient Medications (Endocrine & Metabolic):    levothyroxine (SYNTHROID) 50 MCG tablet, Take  1 tablet  Daily  on an empty stomach with only water for 30 minutes & no Antacid meds, Calcium or Magnesium for 4 hours & avoid Biotin                                                                                                                    /                                                                   TAKE                                         BY                                                 MOUTH (Patient taking differently: Take 50 mcg by mouth See admin instructions. Take 50 mcg by mouth in the morning on an empty stomach, with only water. Take no antacids, calcium, or magnesium for 4 hours and avoid biotin.)  Current Outpatient Medications (Cardiovascular):    atorvastatin (LIPITOR) 80 MG tablet, Take 1 tablet (80 mg total) by mouth daily.   diltiazem (CARDIZEM CD) 180 MG 24 hr capsule, Take 1 capsule (180 mg total) by mouth daily.   furosemide (LASIX) 20 MG tablet, Take 1 tablet (20 mg total) by mouth daily as needed. (Patient taking differently: Take 20 mg by mouth as needed for fluid or edema.)   metoprolol tartrate (LOPRESSOR) 25 MG tablet, Take 1 tablet (25 mg total) by mouth 2 (two) times daily.  Current Outpatient Medications (Respiratory):    cetirizine (ZYRTEC) 10 MG tablet, Take 10 mg by mouth daily.   oxymetazoline (AFRIN) 0.05 % nasal spray, Place 1 spray into both nostrils 2 (two) times daily. For 5 days   sodium chloride (OCEAN) 0.65 % SOLN nasal spray, Place 1 spray into both nostrils as needed for congestion.  Current Outpatient Medications (Analgesics):    TYLENOL 500 MG tablet, Take 500-1,000 mg by mouth every 6 (six) hours as needed for mild pain or headache.  Current Outpatient Medications (Hematological):    apixaban (ELIQUIS) 5 MG TABS tablet, Take 1 tablet (5 mg total) by mouth 2 (two) times daily.  Current Outpatient Medications (Other):     Ascorbic Acid (VITAMIN C) 500 MG CAPS, Take  500 mg by mouth daily.    BOTOX 100 units SOLR injection, Inject 100 Units into the muscle See admin instructions. Inject 100 units intramuscularly every 6-8 weeks   Cholecalciferol (VITAMIN D3) 125 MCG (5000 UT) TABS, Take 5,000 Units by mouth daily.   divalproex (DEPAKOTE ER) 500 MG 24 hr tablet, Take 1 tablet (500 mg total) by mouth daily.   gabapentin (NEURONTIN) 300 MG capsule, Take 300 mg (1 tablet) in the morning and 600 mg (2 tablets) at bedtime   Magnesium Oxide 400 MG CAPS, Take 1 capsule (400 mg total) by mouth at bedtime.   pantoprazole (PROTONIX) 40 MG tablet, Take  1 tablet  2 x /day  for Acid Indigestion & Heartburn (Patient taking differently: Take 40 mg by mouth in the morning and at bedtime.)   SYSTANE BALANCE 0.6 % SOLN, Place 1 drop into both eyes at bedtime as needed (dry eyes).   SYSTANE ULTRA 0.4-0.3 % SOLN, Place 1 drop into both eyes daily as needed (dry eyes).   Zinc 30 MG TABS, Take 30 mg by mouth daily.  Past Medical History:  Diagnosis Date   Anemia with low platelet count (HCC)    Chronic kidney disease    Degenerative joint disease    Dysrhythmia    a fib   GERD (gastroesophageal reflux disease)    History of kidney stones    Hyperlipidemia    Hypothyroidism    Inguinal hernia 02/19/2019   Labile hypertension    Migraines    neurontin helps   Pneumonia    hx of walking pneumonia    Primary hypertension 04/28/2022     No Known Allergies  ROS: all negative except above.   Physical Exam: Filed Weights   03/28/23 1509  Weight: 162 lb 12.8 oz (73.8 kg)   BP 106/68   Pulse 62   Temp 98 F (36.7 C)   Resp (!) 99   Ht 5' 2.5" (1.588 m)   Wt 162 lb 12.8 oz (73.8 kg)   BMI 29.30 kg/m  General Appearance: Well nourished, in no apparent distress. Eyes: PERRLA, EOMs, conjunctiva no swelling or erythema Sinuses: No Frontal/maxillary tenderness ENT/Mouth: Ext aud canals clear, TMs without erythema,  bulging. No erythema, swelling, or exudate on post pharynx.  Tonsils not swollen or erythematous. Hearing normal.  Neck: Supple, thyroid normal.  Respiratory: Respiratory effort normal, BS equal bilaterally without rales, rhonchi, wheezing or stridor.  Cardio: RRR with no MRGs. Brisk peripheral pulses without edema.  Abdomen: Soft, + BS.  Non tender, no guarding, rebound, hernias, masses. Lymphatics: Non tender without lymphadenopathy.  Musculoskeletal: Full ROM, 5/5 strength, normal gait.  Skin: Warm, dry without rashes, lesions, ecchymosis.  Neuro: Cranial nerves intact. Normal muscle tone, no cerebellar symptoms. Sensation intact.  Psych: Awake and oriented X 3, normal affect, Insight and Judgment appropriate.     Adela Glimpse, NP 3:15 PM [facility]

## 2023-04-03 ENCOUNTER — Inpatient Hospital Stay: Payer: Medicare PPO

## 2023-04-03 ENCOUNTER — Inpatient Hospital Stay (HOSPITAL_BASED_OUTPATIENT_CLINIC_OR_DEPARTMENT_OTHER): Payer: Medicare PPO | Admitting: Hematology and Oncology

## 2023-04-03 VITALS — BP 115/67 | HR 75 | Temp 97.3°F | Resp 16 | Wt 158.5 lb

## 2023-04-03 DIAGNOSIS — D693 Immune thrombocytopenic purpura: Secondary | ICD-10-CM

## 2023-04-03 DIAGNOSIS — D696 Thrombocytopenia, unspecified: Secondary | ICD-10-CM

## 2023-04-03 LAB — CBC WITH DIFFERENTIAL/PLATELET
Abs Immature Granulocytes: 0.02 10*3/uL (ref 0.00–0.07)
Basophils Absolute: 0 10*3/uL (ref 0.0–0.1)
Basophils Relative: 1 %
Eosinophils Absolute: 0.1 10*3/uL (ref 0.0–0.5)
Eosinophils Relative: 1 %
HCT: 31 % — ABNORMAL LOW (ref 36.0–46.0)
Hemoglobin: 10.6 g/dL — ABNORMAL LOW (ref 12.0–15.0)
Immature Granulocytes: 0 %
Lymphocytes Relative: 30 %
Lymphs Abs: 1.7 10*3/uL (ref 0.7–4.0)
MCH: 32.2 pg (ref 26.0–34.0)
MCHC: 34.2 g/dL (ref 30.0–36.0)
MCV: 94.2 fL (ref 80.0–100.0)
Monocytes Absolute: 0.7 10*3/uL (ref 0.1–1.0)
Monocytes Relative: 13 %
Neutro Abs: 3.2 10*3/uL (ref 1.7–7.7)
Neutrophils Relative %: 55 %
Platelets: 105 10*3/uL — ABNORMAL LOW (ref 150–400)
RBC: 3.29 MIL/uL — ABNORMAL LOW (ref 3.87–5.11)
RDW: 15.4 % (ref 11.5–15.5)
WBC: 5.8 10*3/uL (ref 4.0–10.5)
nRBC: 0 % (ref 0.0–0.2)

## 2023-04-03 NOTE — Progress Notes (Signed)
[facility] Cancer Follow up:    [name], MD [address] [place] [address]   DIAGNOSIS: Thrombocytopenia  SUMMARY OF HEMATOLOGIC HISTORY:  Underwent thrombocytopenia lab evaluation inn November 2021 without no evidence of hepatitis, hypothyroidism, nutritional deficiencies.  MR of the abdomen from May 2021 shows no evidence of liver disease or splenomegaly.  Bone marrow biopsy from 03/19/2020 showed hypercellularity, no evidence of dyspoeisis, megakaryocytic hyperplasia. Flow cytometry negative for any monoclonal B cell population. Cytogenetics were normal.  Bone marrow biopsy 02/13/2023, overall normocellular marrow with orderly trilineage hematopoiesis and megakaryocytic hyperplasia. 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 Cytogenetics showed normal female karyotype.  Steroids 02/22/2023 which seemed to improve the platelet count but led to a slump in energy levels after discontinuation-minimal improvement in platelet count IVIG 03/19/2023 and 03/22/2023  CURRENT THERAPY: IVIG given earlier this week  INTERVAL HISTORY: Discussed the use of AI scribe software for clinical note transcription with the patient, who gave verbal consent to proceed.  History of Present Illness    Jillian Rodgers 77 y.o. female returns for follow-up after receiving IVIG. The patient, with a history of atrial fibrillation and thrombocytopenia, presents with ongoing fatigue and a recent fall. She describes feeling 'very tired' and 'not right,' and reports a fall after tripping over a rug. The fall resulted in transient ankle pain but no significant injury. She also describes occasional difficulty breathing and chest discomfort.  The patient's atrial fibrillation has been associated with palpitations and was the reason for a recent hospital admission.  Her thrombocytopenia has shown  improvement, with a recent platelet count over 100,000. The patient denies any recent gastrointestinal bleeding.  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  MEDICAL HISTORY: Past Medical History:  Diagnosis Date   Anemia with low platelet count (HCC)    Chronic kidney disease    Degenerative joint disease    Dysrhythmia    a fib   GERD (gastroesophageal reflux disease)    History of kidney stones    Hyperlipidemia    Hypothyroidism    Inguinal hernia 02/19/2019   Labile hypertension    Migraines    neurontin helps   Pneumonia    hx of walking pneumonia    Primary hypertension 04/28/2022    SURGICAL HISTORY: Past Surgical History:  Procedure Laterality Date   BILATERAL CATARACT SURGERY      BLERPHOROPLASTY \     CHOLECYSTECTOMY  1996  open   COLONOSCOPY  2009   recommended 10 year f/u   HERNIA REPAIR  1985   umb hernia rpr   HERNIA REPAIR  2005 & 2009    ventral hernia rpr   INGUINAL HERNIA REPAIR Left 04/30/2020   Procedure: OPEN LEFT INGUINAL HERNIA REPAIR WITH MESH;  Surgeon: Berna Bue, MD;  Location: [facility];  Service: General;  Laterality: Left;   INGUINAL HERNIA REPAIR Right 10/24/2021   Procedure: OPEN RIGHT INGUINAL HERNIA REPAIR WITH MESH ;  Surgeon: Berna Bue, MD;  Location: [facility];  Service: General;  Laterality: Right;   PILONIDAL CYST EXCISION  1972   SKIN CANCER REMOVAL      TONSILLECTOMY     UPPER GI ENDOSCOPY     VENTRAL HERNIA REPAIR      SOCIAL HISTORY: Social History   Socioeconomic History   Marital status: Married    Spouse name: Not on file   Number of children: 2   Years of education: 18   Highest education level: Not on file  Occupational History   Not on file  Tobacco Use   Smoking status: Never   Smokeless tobacco: Never  Vaping Use   Vaping status: Never Used  Substance and Sexual Activity   Alcohol use: No   Drug use: No   Sexual activity: Yes  Other Topics Concern   Not on file  Social History Narrative   Live with husband    Retired    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  Food Insecurity: No Food Insecurity (03/25/2023)   Hunger Vital Sign    Worried About Running Out of Food in the Last Year: Never true    Ran Out of Food in the Last Year: Never true  Transportation Needs: No Transportation Needs (03/25/2023)   PRAPARE - Administrator, Civil Service (Medical): No    Lack of Transportation (Non-Medical): No  Physical Activity: Not on file  Stress: Not on file  Social Connections: Not on file  Intimate Partner Violence: Not At Risk (03/25/2023)   Humiliation, Afraid, Rape, and Kick questionnaire    Fear of Current or Ex-Partner: No    Emotionally Abused: No    Physically Abused: No    Sexually Abused: No    FAMILY HISTORY: Family History  Problem Relation Age of Onset   Ovarian cancer Mother    Heart disease Mother     Cancer Mother    Uterine cancer Mother    Hypertension Mother    Breast cancer Sister    Stroke Paternal Uncle    Congestive Heart Failure Father    Gout Maternal Aunt    Colon cancer Neg Hx    Esophageal cancer Neg Hx    Stomach cancer Neg Hx    Rectal cancer Neg Hx     Review of Systems  Constitutional:  Negative for appetite change, chills, fatigue, fever and unexpected weight change.  HENT:   Negative for hearing loss, lump/mass and trouble swallowing.   Eyes:  Negative for eye problems and icterus.  Respiratory:  Negative for chest tightness, cough and shortness of breath.   Cardiovascular:  Negative for chest pain, leg swelling and palpitations.  Gastrointestinal:  Negative for abdominal distention, abdominal pain, constipation, diarrhea, nausea and vomiting.  Endocrine: Negative for hot flashes.  Genitourinary:  Negative for difficulty urinating.   Musculoskeletal:  Negative for arthralgias.  Skin:  Negative for itching and rash.  Neurological:  Negative for dizziness, extremity weakness, headaches and numbness.  Hematological:  Negative for adenopathy. Does not bruise/bleed easily.  Psychiatric/Behavioral:  Negative for depression. The patient is not nervous/anxious.   All other systems reviewed and are negative.     PHYSICAL EXAMINATION    Vitals:   04/03/23 1530  BP: 115/67  Pulse: 75  Resp: 16  Temp: (!) 97.3 F (36.3 C)  SpO2: 99%   Alert, oriented no acute distress No cervical or axillary adneopathy Chest: CTA bilaterally Heart: RRR No LE edema.  LABORATORY DATA:  CBC    Component Value Date/Time   WBC 4.6 03/25/2023 0513   RBC 3.45 (L) 03/25/2023 0513   HGB 11.0 (L) 03/25/2023 0513   HGB 11.2 (L) 02/08/2023 1430   HGB 11.9 01/29/2023 1546   HCT 32.6 (L) 03/25/2023 0513   HCT 36.1 02/12/2023 1458   PLT 150 03/25/2023 0513   PLT 32 (L) 02/08/2023 1430   PLT 40 (LL) 01/29/2023 1546   MCV 94.5 03/25/2023 0513   MCV 94 01/29/2023 1546   MCH  31.9 03/25/2023 0513   MCHC 33.7 03/25/2023 0513   RDW 14.1 03/25/2023 0513   RDW 13.2 01/29/2023 1546   LYMPHSABS 1.2 03/23/2023 1104   MONOABS 0.8 03/23/2023 1104   EOSABS 0.1 03/23/2023 1104   BASOSABS 0.0 03/23/2023 1104    CMP     Component Value Date/Time   NA 134 (L) 03/25/2023 0513   NA 142 01/29/2023 1546   K 4.4 03/25/2023 0513   CL 102 03/25/2023 0513   CO2 25 03/25/2023 0513   GLUCOSE 87 03/25/2023 0513   BUN 21 03/25/2023 0513   BUN 26 01/29/2023 1546   CREATININE 1.02 (H) 03/25/2023 0513   CREATININE 1.24 (H) 03/09/2023 1204   CREATININE 1.27 (H) 01/18/2023 1219   CALCIUM 9.6 03/25/2023 0513   PROT 6.4 (L) 03/09/2023 1204   ALBUMIN 3.7 03/09/2023 1204   AST 16 03/09/2023 1204   ALT 13 03/09/2023 1204   ALKPHOS 54 03/09/2023 1204   BILITOT 0.6 03/09/2023 1204   GFRNONAA 56 (L) 03/25/2023 0513   GFRNONAA 44 (L) 03/09/2023 1204   GFRNONAA 45 (L) 10/20/2020 1542   GFRAA 52 (L) 10/20/2020 1542      ASSESSMENT and THERAPY PLAN:   Thrombocytopenia Platelet count improved to 105,000. Patient reports fatigue and a recent fall. Discussed potential for re-initiation of rituximab if platelet count decreases again vs trial of IVIG again -Check platelet count in 1 week to ensure stability.  Atrial Fibrillation Patient reports fatigue and difficulty breathing, which may be related to AFib. Cardiology appointment scheduled for 04/17/2023 to discuss potential placement of a Watchman device. -Discuss with cardiologist the potential relationship between AFib and fatigue.  Fall Risk Recent fall due to tripping over a rug, resulting in transient ankle pain. No reported injuries. -Encourage patient to remove potential tripping hazards at home.  Anemia Mild anemia with hemoglobin close to 11. No reported symptoms suggestive of GI bleeding. -Monitor hemoglobin levels.  Follow-up in 4 weeks in person, with interim lab check in 1 week. She clearly understands ED  precautions and to call us in case she has any new concerns.  Total encounter time:20 minutes*in face-to-face visit time, chart review, lab review, care coordination, order entry, and documentation of the encounter time.  *Total Encounter Time as defined by the Centers for Medicare and Medicaid Services includes, in addition to the face-to-face time of a patient visit (documented in the note above)  non-face-to-face time: obtaining and reviewing outside history, ordering and reviewing medications, tests or procedures, care coordination (communications with other health care professionals or caregivers) and documentation in the medical record.

## 2023-04-04 ENCOUNTER — Encounter: Payer: Self-pay | Admitting: Internal Medicine

## 2023-04-04 DIAGNOSIS — M542 Cervicalgia: Secondary | ICD-10-CM | POA: Diagnosis not present

## 2023-04-04 DIAGNOSIS — G43019 Migraine without aura, intractable, without status migrainosus: Secondary | ICD-10-CM | POA: Diagnosis not present

## 2023-04-04 DIAGNOSIS — G518 Other disorders of facial nerve: Secondary | ICD-10-CM | POA: Diagnosis not present

## 2023-04-04 DIAGNOSIS — M791 Myalgia, unspecified site: Secondary | ICD-10-CM | POA: Diagnosis not present

## 2023-04-05 ENCOUNTER — Ambulatory Visit: Payer: Medicare PPO | Admitting: Internal Medicine

## 2023-04-08 NOTE — Patient Instructions (Signed)
Dizziness Dizziness is a common problem. It makes you feel unsteady or light-headed. You may feel like you're about to faint. Dizziness can lead to getting hurt if you stumble or fall. It's more common to feel dizzy if you're an older adult. Many things can cause you to feel dizzy. These include: Medicines. Dehydration. This is when there's not enough water in your body. Illness. Follow these instructions at home: Eating and drinking  Drink enough fluid to keep your pee (urine) pale yellow. This helps keep you from getting dehydrated. Try to drink more clear fluids, such as water. Do not drink alcohol. Try to limit how much caffeine you take in. Try to limit how much salt, also called sodium, you take in. Activity Try not to make quick movements. Stand up slowly from sitting in a chair. Steady yourself until you feel okay. In the morning, first sit up on the side of the bed. When you feel okay, hold onto something and slowly stand up. Do this until you know that your balance is okay. If you need to stand in one place for a long time, move your legs often. Tighten and relax the muscles in your legs while you're standing. Do not drive or use machines if you feel dizzy. Avoid bending down if you feel dizzy. Place items in your home so you can reach them without leaning over. Lifestyle Do not smoke, vape, or use products with nicotine or tobacco in them. If you need help quitting, talk with your health care provider. Try to lower your stress level. You can do this by using methods like yoga or meditation. Talk with your provider if you need help. General instructions Watch your dizziness for any changes. Take your medicines only as told by your provider. Talk with your provider if you think you're dizzy because of a medicine you're taking. Tell a friend or a family member that you're feeling dizzy. If they spot any changes in your behavior, have them call your provider. Contact a health care  provider if: Your dizziness doesn't go away, or you have new symptoms. Your dizziness gets worse. You feel like you may vomit. You have trouble hearing. You have a fever. You have neck pain or a stiff neck. You fall or get hurt. Get help right away if: You vomit each time you eat or drink. You have watery poop and can't eat or drink. You have trouble talking, walking, swallowing, or using your arms, hands, or legs. You feel very weak. You're bleeding. You're not thinking clearly, or you have trouble forming sentences. A friend or family member may spot this. Your vision changes, or you get a very bad headache. These symptoms may be an emergency. Call 911 right away. Do not wait to see if the symptoms will go away. Do not drive yourself to the hospital. This information is not intended to replace advice given to you by your health care provider. Make sure you discuss any questions you have with your health care provider. Document Revised: 05/18/2022 Document Reviewed: 05/18/2022 Elsevier Patient Education  2024 [employer].

## 2023-04-12 ENCOUNTER — Other Ambulatory Visit: Payer: Self-pay

## 2023-04-12 DIAGNOSIS — K219 Gastro-esophageal reflux disease without esophagitis: Secondary | ICD-10-CM

## 2023-04-12 MED ORDER — PANTOPRAZOLE SODIUM 40 MG PO TBEC: DELAYED_RELEASE_TABLET | ORAL | 3 refills | Status: DC

## 2023-04-13 ENCOUNTER — Inpatient Hospital Stay: Payer: Medicare PPO

## 2023-04-13 DIAGNOSIS — D693 Immune thrombocytopenic purpura: Secondary | ICD-10-CM

## 2023-04-13 LAB — CBC WITH DIFFERENTIAL/PLATELET
Abs Immature Granulocytes: 0.01 10*3/uL (ref 0.00–0.07)
Basophils Absolute: 0 10*3/uL (ref 0.0–0.1)
Basophils Relative: 1 %
Eosinophils Absolute: 0 10*3/uL (ref 0.0–0.5)
Eosinophils Relative: 1 %
HCT: 33 % — ABNORMAL LOW (ref 36.0–46.0)
Hemoglobin: 10.9 g/dL — ABNORMAL LOW (ref 12.0–15.0)
Immature Granulocytes: 0 %
Lymphocytes Relative: 25 %
Lymphs Abs: 1.2 10*3/uL (ref 0.7–4.0)
MCH: 31.7 pg (ref 26.0–34.0)
MCHC: 33 g/dL (ref 30.0–36.0)
MCV: 95.9 fL (ref 80.0–100.0)
Monocytes Absolute: 0.7 10*3/uL (ref 0.1–1.0)
Monocytes Relative: 15 %
Neutro Abs: 2.8 10*3/uL (ref 1.7–7.7)
Neutrophils Relative %: 58 %
Platelets: 64 10*3/uL — ABNORMAL LOW (ref 150–400)
RBC: 3.44 MIL/uL — ABNORMAL LOW (ref 3.87–5.11)
RDW: 15.5 % (ref 11.5–15.5)
WBC: 4.8 10*3/uL (ref 4.0–10.5)
nRBC: 0 % (ref 0.0–0.2)

## 2023-04-16 ENCOUNTER — Encounter: Payer: Self-pay | Admitting: Hematology and Oncology

## 2023-04-16 NOTE — Progress Notes (Signed)
 Electrophysiology Office Note:    Date:  04/17/2023   ID:  Jillian Rodgers, DOB 08-05-1945, MRN [id number]  [facility] Cardiologist:  Lonni LITTIE Nanas, MD  [facility] Electrophysiologist:  OLE ONEIDA HOLTS, MD   Referring MD: Tonita Fallow, MD   Chief Complaint: Atrial fibrillation  History of Present Illness:    Ms. Jillian Rodgers is a 77 year old woman who I am seeing today for an evaluation of atrial fibrillation and stroke risk mitigation at the request of Dr. Nanas.  The patient has a history of TIA in 2024, chronic thrombocytopenia, GERD, hypertension, hyperlipidemia, atrial fibrillation not on anticoagulation and hypothyroidism.  Her TIA occurred in July 2024.  She had a 10 to 15-minute episode of inability to speak.  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  She had a bone marrow biopsy in December 2021.  She had a repeat bone marrow biopsy in October 2024.  The patient is with her husband today in clinic.     Their past medical, social and family history was reviewed.   ROS:   Please see the history of present illness.    All other systems reviewed and are negative.  EKGs/Labs/Other Studies Reviewed:    The following studies were reviewed today:  January 12, 2023 echo EF 60-65 RV normal Severely dilated left atrium No significant valvular disease  March 23, 2023 EKG shows A-fib with RVR.  Ventricular rates 176 bpm  October 17, 2022 EKG shows sinus rhythm         Physical Exam:    VS:  BP 114/76 (BP Location: Left Arm, Patient Position: Sitting, Cuff Size: Normal)   Pulse 74   Ht 5' 2.5 (1.588 m)   Wt 152 lb (68.9 kg)   SpO2 98%   BMI 27.36 kg/m     Wt Readings from Last 3 Encounters:  04/17/23 152 lb (68.9 kg)  04/03/23 158 lb 8 oz (71.9 kg)  03/28/23 162 lb 12.8 oz (73.8 kg)     GEN: no distress.  Appears younger than stated age CARD: RRR, No MRG RESP: No IWOB. CTAB.        ASSESSMENT AND PLAN:    1.  Thrombocytopenia (HCC)   2. Persistent atrial fibrillation (HCC)     #Atrial fibrillation #Thrombocytopenia #ITP The patient has a complex situation involving symptomatic atrial fibrillation with rapid ventricular rates and an inability to take anticoagulation despite an elevated CHA2DS2-VASc and a history of TIA.  Ideally we would pursue a solution that targeted not only the atrial fibrillation but also her long-term stroke risk.  For any invasive procedure, she would need to be able to tolerate short-term anticoagulation.  Ideally, would start anticoagulation at least 1 week prior to combined ablation/Watchman (or continue the medication given she is currently taking).  Plan to continue the anticoagulation for 90 days before transitioning to Plavix  monotherapy for an additional 3 months.  I will discuss short-term anticoagulation with her oncology team.  ------------  Discussed treatment options today for AF including antiarrhythmic drug therapy and ablation. Discussed risks, recovery and likelihood of success with each treatment strategy. Risk, benefits, and alternatives to EP study and ablation for afib were discussed. These risks include but are not limited to stroke, bleeding, vascular damage, tamponade, perforation, damage to the esophagus, lungs, phrenic nerve and other structures, pulmonary vein stenosis, worsening renal function, coronary vasospasm and death.  Discussed potential need for repeat ablation procedures and antiarrhythmic drugs after an initial ablation. The patient  understands these risk and wishes to proceed.  We will therefore proceed with catheter ablation at the next available time.  Carto, ICE, anesthesia are requested for the procedure.  Will also obtain CT PV protocol prior to the procedure to exclude LAA thrombus and further evaluate atrial anatomy.  ----------------  I have seen Jillian Rodgers in the office today who is being considered for a Watchman left atrial  appendage closure device. I believe they will benefit from this procedure given their history of atrial fibrillation, CHA2DS2-VASc score of 6. Unfortunately, the patient is not felt to be a long term anticoagulation candidate secondary to thrombocytopenia. The patient's chart has been reviewed and I feel that they would be a candidate for short term oral anticoagulation after Watchman implant.   It is my belief that after undergoing a LAA closure procedure, Jillian Rodgers will not need long term anticoagulation which eliminates anticoagulation side effects and major bleeding risk.   Procedural risks for the Watchman implant have been reviewed with the patient including a 0.5% risk of stroke, <1% risk of perforation and <1% risk of device embolization. Other risks include bleeding, vascular damage, tamponade, worsening renal function, and death. The patient understands these risk and wishes to proceed.     The published clinical data on the safety and effectiveness of WATCHMAN include but are not limited to the following: - Holmes DR, Jess BEARD, Sick P et al. for the PROTECT AF Investigators. Percutaneous closure of the left atrial appendage versus warfarin therapy for prevention of stroke in patients with atrial fibrillation: a randomised non-inferiority trial. Lancet 2009; 374: 534-42. GLENWOOD Jess BEARD, Doshi SK, Jonita VEAR Satchel D et al. on behalf of the PROTECT AF Investigators. Percutaneous Left Atrial Appendage Closure for Stroke Prophylaxis in Patients With Atrial Fibrillation 2.3-Year Follow-up of the PROTECT AF (Watchman Left Atrial Appendage System for Embolic Protection in Patients With Atrial Fibrillation) Trial. Circulation 2013; 127:720-729. - Alli O, Doshi S,  Kar S, Reddy VY, Sievert H et al. Quality of Life Assessment in the Randomized PROTECT AF (Percutaneous Closure of the Left Atrial Appendage Versus Warfarin Therapy for Prevention of Stroke in Patients With Atrial Fibrillation) Trial of  Patients at Risk for Stroke With Nonvalvular Atrial Fibrillation. J Am Coll Cardiol 2013; 61:1790-8. GLENWOOD Satchel DR, Archer RAMAN, Price M, Whisenant B, Sievert H, Doshi S, Huber K, Reddy V. Prospective randomized evaluation of the Watchman left atrial appendage Device in patients with atrial fibrillation versus long-term warfarin therapy; the PREVAIL trial. Journal of the [employer] of Cardiology, Vol. 4, No. 1, 2014, 1-11. - Kar S, Doshi SK, Sadhu A, Horton R, Osorio J et al. Primary outcome evaluation of a next-generation left atrial appendage closure device: results from the PINNACLE FLX trial. Circulation 2021;143(18)1754-1762.    After today's visit with the patient which was dedicated solely for shared decision making visit regarding LAA closure device, the patient decided to proceed with the LAA appendage closure procedure scheduled to be done in the near future at [facility]. Prior to the procedure, I would like to obtain a gated CT scan of the chest with contrast timed for PV/LA visualization.   HAS-BLED score 3 Hypertension Yes  Abnormal renal and liver function (Dialysis, transplant, Cr >2.26 mg/dL /Cirrhosis or Bilirubin >2x Normal or AST/ALT/AP >3x Normal) okayNo  Stroke Yes  Bleeding No  Labile INR (Unstable/high INR) No  Elderly (>65) Yes  Drugs or alcohol  (>= 8 drinks/week, anti-plt or NSAID) No  CHA2DS2-VASc Score = 6  The patient's score is based upon: CHF History: 0 HTN History: 1 Diabetes History: 0 Stroke History: 2 Vascular Disease History: 0 Age Score: 2 Gender Score: 1     Signed, Ole T. Cindie, MD, [facility], [facility] 04/17/2023 11:24 AM    Electrophysiology [facility]

## 2023-04-17 ENCOUNTER — Encounter: Payer: Self-pay | Admitting: Cardiology

## 2023-04-17 ENCOUNTER — Other Ambulatory Visit: Payer: Self-pay | Admitting: Nurse Practitioner

## 2023-04-17 ENCOUNTER — Ambulatory Visit: Payer: Medicare PPO | Attending: Cardiology | Admitting: Cardiology

## 2023-04-17 VITALS — BP 114/76 | HR 74 | Ht 62.5 in | Wt 152.0 lb

## 2023-04-17 DIAGNOSIS — D696 Thrombocytopenia, unspecified: Secondary | ICD-10-CM

## 2023-04-17 DIAGNOSIS — Z79899 Other long term (current) drug therapy: Secondary | ICD-10-CM

## 2023-04-17 DIAGNOSIS — I4819 Other persistent atrial fibrillation: Secondary | ICD-10-CM

## 2023-04-17 NOTE — Patient Instructions (Signed)
 Medication Instructions:  Your physician recommends that you continue on your current medications as directed. Please refer to the Current Medication list given to you today.  *If you need a refill on your cardiac medications before your next appointment, please call your pharmacy*  Testing/Procedures: Cardiac CT Your physician has requested that you have cardiac CT. Cardiac computed tomography (CT) is a painless test that uses an x-ray machine to take clear, detailed pictures of your heart. For further information please visit [email or web address] We will call you to schedule your CT scan. It will be done about three weeks prior to your ablation.  Ablation Your physician has recommended that you have an ablation. Catheter ablation is a medical procedure used to treat some cardiac arrhythmias (irregular heartbeats). During catheter ablation, a long, thin, flexible tube is put into a blood vessel in your groin (upper thigh), or neck. This tube is called an ablation catheter. It is then guided to your heart through the blood vessel. Radio frequency waves destroy small areas of heart tissue where abnormal heartbeats may cause an arrhythmia to start.   Watchman Your physician has requested that you have Left atrial appendage (LAA) closure device implantation is a procedure to put a small device in the LAA of the heart. The LAA is a small sac in the wall of the heart's left upper chamber. Blood clots can form in this area. The device, Watchman closes the LAA to help prevent a blood clot and stroke.   Follow-Up: At [facility], you and your health needs are our priority.  As part of our continuing mission to provide you with exceptional heart care, we have created designated Provider Care Teams.  These Care Teams include your primary Cardiologist (physician) and Advanced Practice Providers (APPs -  Physician Assistants and Nurse Practitioners) who all work together to provide you with the care  you need, when you need it.   You will be contacted by Nurse Navigator, Rockie Redman to schedule your procedure date. If you have any questions she can be reached at [phone].

## 2023-04-18 ENCOUNTER — Other Ambulatory Visit: Payer: Self-pay

## 2023-04-18 ENCOUNTER — Encounter (HOSPITAL_COMMUNITY): Payer: Self-pay | Admitting: *Deleted

## 2023-04-18 ENCOUNTER — Emergency Department (HOSPITAL_COMMUNITY): Payer: Medicare PPO

## 2023-04-18 ENCOUNTER — Emergency Department (HOSPITAL_COMMUNITY)
Admission: EM | Admit: 2023-04-18 | Discharge: 2023-04-18 | Disposition: A | Discharge status: Home / Self Care | Payer: Medicare PPO | Attending: Emergency Medicine | Admitting: Emergency Medicine

## 2023-04-18 DIAGNOSIS — R0602 Shortness of breath: Secondary | ICD-10-CM | POA: Insufficient documentation

## 2023-04-18 DIAGNOSIS — K449 Diaphragmatic hernia without obstruction or gangrene: Secondary | ICD-10-CM | POA: Insufficient documentation

## 2023-04-18 DIAGNOSIS — Z79899 Other long term (current) drug therapy: Secondary | ICD-10-CM | POA: Insufficient documentation

## 2023-04-18 DIAGNOSIS — I480 Paroxysmal atrial fibrillation: Secondary | ICD-10-CM | POA: Diagnosis not present

## 2023-04-18 DIAGNOSIS — R079 Chest pain, unspecified: Secondary | ICD-10-CM | POA: Diagnosis not present

## 2023-04-18 DIAGNOSIS — I4891 Unspecified atrial fibrillation: Secondary | ICD-10-CM | POA: Diagnosis not present

## 2023-04-18 DIAGNOSIS — J9811 Atelectasis: Secondary | ICD-10-CM | POA: Diagnosis not present

## 2023-04-18 DIAGNOSIS — I517 Cardiomegaly: Secondary | ICD-10-CM | POA: Diagnosis not present

## 2023-04-18 DIAGNOSIS — R002 Palpitations: Secondary | ICD-10-CM | POA: Diagnosis not present

## 2023-04-18 DIAGNOSIS — Z8673 Personal history of transient ischemic attack (TIA), and cerebral infarction without residual deficits: Secondary | ICD-10-CM | POA: Insufficient documentation

## 2023-04-18 DIAGNOSIS — I700 Atherosclerosis of aorta: Secondary | ICD-10-CM | POA: Insufficient documentation

## 2023-04-18 DIAGNOSIS — Z7901 Long term (current) use of anticoagulants: Secondary | ICD-10-CM | POA: Insufficient documentation

## 2023-04-18 LAB — CBC WITH DIFFERENTIAL/PLATELET
Abs Immature Granulocytes: 0.01 10*3/uL (ref 0.00–0.07)
Basophils Absolute: 0 10*3/uL (ref 0.0–0.1)
Basophils Relative: 1 %
Eosinophils Absolute: 0.1 10*3/uL (ref 0.0–0.5)
Eosinophils Relative: 1 %
HCT: 33.2 % — ABNORMAL LOW (ref 36.0–46.0)
Hemoglobin: 11 g/dL — ABNORMAL LOW (ref 12.0–15.0)
Immature Granulocytes: 0 %
Lymphocytes Relative: 26 %
Lymphs Abs: 1.6 10*3/uL (ref 0.7–4.0)
MCH: 32.3 pg (ref 26.0–34.0)
MCHC: 33.1 g/dL (ref 30.0–36.0)
MCV: 97.4 fL (ref 80.0–100.0)
Monocytes Absolute: 0.8 10*3/uL (ref 0.1–1.0)
Monocytes Relative: 13 %
Neutro Abs: 3.8 10*3/uL (ref 1.7–7.7)
Neutrophils Relative %: 59 %
Platelets: 66 10*3/uL — ABNORMAL LOW (ref 150–400)
RBC: 3.41 MIL/uL — ABNORMAL LOW (ref 3.87–5.11)
RDW: 15.7 % — ABNORMAL HIGH (ref 11.5–15.5)
WBC: 6.3 10*3/uL (ref 4.0–10.5)
nRBC: 0 % (ref 0.0–0.2)

## 2023-04-18 LAB — COMPREHENSIVE METABOLIC PANEL
ALT: 28 U/L (ref 0–44)
AST: 25 U/L (ref 15–41)
Albumin: 3.3 g/dL — ABNORMAL LOW (ref 3.5–5.0)
Alkaline Phosphatase: 55 U/L (ref 38–126)
Anion gap: 6 (ref 5–15)
BUN: 23 mg/dL (ref 8–23)
CO2: 25 mmol/L (ref 22–32)
Calcium: 9.4 mg/dL (ref 8.9–10.3)
Chloride: 104 mmol/L (ref 98–111)
Creatinine, Ser: 1.07 mg/dL — ABNORMAL HIGH (ref 0.44–1.00)
GFR, Estimated: 53 mL/min — ABNORMAL LOW (ref >60)
Glucose, Bld: 97 mg/dL (ref 70–99)
Potassium: 4.2 mmol/L (ref 3.5–5.1)
Sodium: 135 mmol/L (ref 135–145)
Total Bilirubin: 0.5 mg/dL (ref 0.0–1.2)
Total Protein: 7.2 g/dL (ref 6.5–8.1)

## 2023-04-18 LAB — LIPASE, BLOOD: Lipase: 33 U/L (ref 11–51)

## 2023-04-18 LAB — TROPONIN I (HIGH SENSITIVITY): Troponin I (High Sensitivity): 9 ng/L (ref <18)

## 2023-04-18 LAB — BRAIN NATRIURETIC PEPTIDE: B Natriuretic Peptide: 721.8 pg/mL — ABNORMAL HIGH (ref 0.0–100.0)

## 2023-04-18 MED ORDER — METOPROLOL TARTRATE 25 MG PO TABS: 37.5000 mg | ORAL_TABLET | Freq: Two times a day (BID) | ORAL | 0 refills | Status: DC

## 2023-04-18 MED ORDER — METOPROLOL TARTRATE 25 MG PO TABS
25.0000 mg | ORAL_TABLET | Freq: Once | ORAL | Status: AC
  Administered 2023-04-18: 25 mg via ORAL
  Filled 2023-04-18: qty 1

## 2023-04-18 MED ORDER — METOPROLOL TARTRATE 25 MG PO TABS
12.5000 mg | ORAL_TABLET | Freq: Once | ORAL | Status: AC
  Administered 2023-04-18: 12.5 mg via ORAL
  Filled 2023-04-18: qty 1

## 2023-04-18 NOTE — ED Triage Notes (Signed)
 BIB spouse from home for sob, palpitations, intermittent dizziness, and a little CP. Sx onset 0935 this am. Has been monitoring all day. Endorses paroxysmal afib. Denies watchman or ablation. Takes metoprolol , cardizem  and eliquis . HR in triage 116-131. HR at home has been running from 75-168. Denies NV. Mentions just recently developed congested cough, denies recent illness. Alert, NAD, calm, interactive, skin W&D.

## 2023-04-18 NOTE — ED Provider Notes (Signed)
 [facility] AT [facility] Provider Note   CSN: [id number] Arrival date & time: 04/18/23  1843     History  Chief Complaint  Patient presents with   Shortness of Breath   Palpitations    Jillian Rodgers is a 78 y.o. female.  Patient here with palpitations.  She is currently on Eliquis  with metoprolol  and diltiazem .  Patient denies any chest pain shortness of breath.  She has noticed elevated heart rate today.  Was recently started back on medications for A-fib.  She supposed to get a Watchman procedure eventually.  She has a history of ITP and they been monitoring her platelets while she is on Eliquis .  She has not had any bleeding or bruising.  Denies any weakness numbness tingling.  The history is provided by the patient.       Home Medications Prior to Admission medications   Medication Sig Start Date End Date Taking? Authorizing Provider  apixaban  (ELIQUIS ) 5 MG TABS tablet Take 1 tablet (5 mg total) by mouth 2 (two) times daily. 03/25/23   Krishnan, Gokul, MD  Ascorbic Acid (VITAMIN C) 500 MG CAPS Take 500 mg by mouth daily.     [provider]  atorvastatin  (LIPITOR ) 80 MG tablet Take 1 tablet (80 mg total) by mouth daily. 02/19/23   Cranford, Tonya, NP  BOTOX 100 units SOLR injection Inject 100 Units into the muscle See admin instructions. Inject 100 units intramuscularly every 6-8 weeks 10/21/20   [provider]  cetirizine (ZYRTEC) 10 MG tablet Take 10 mg by mouth daily.    [provider]  Cholecalciferol (VITAMIN D3) 125 MCG (5000 UT) TABS Take 5,000 Units by mouth daily.    [provider]  diltiazem  (CARDIZEM  CD) 180 MG 24 hr capsule Take 1 capsule (180 mg total) by mouth daily. 03/25/23   Krishnan, Gokul, MD  divalproex  (DEPAKOTE  ER) 500 MG 24 hr tablet Take 1 tablet (500 mg total) by mouth daily. 02/19/23   Cranford, Tonya, NP  furosemide  (LASIX ) 20 MG tablet TAKE 1 TABLET(20 MG) BY MOUTH DAILY AS NEEDED  04/17/23   Wilkinson, Dana E, NP  gabapentin  (NEURONTIN ) 300 MG capsule Take 300 mg (1 tablet) in the morning and 600 mg (2 tablets) at bedtime 02/19/23   Cranford, Tonya, NP  levothyroxine  (SYNTHROID ) 50 MCG tablet Take  1 tablet  Daily  on an empty stomach with only water for 30 minutes & no Antacid meds, Calcium  or Magnesium  for 4 hours & avoid Biotin                                                                                                                    /  TAKE                                         BY                                                 MOUTH Patient taking differently: Take 50 mcg by mouth See admin instructions. Take 50 mcg by mouth in the morning on an empty stomach, with only water. Take no antacids, calcium , or magnesium  for 4 hours and avoid biotin. 11/15/22   Tonita Fallow, MD  Magnesium  Oxide 400 MG CAPS Take 1 capsule (400 mg total) by mouth at bedtime. 01/13/23   Laurence Locus, DO  metoprolol  tartrate (LOPRESSOR ) 25 MG tablet Take 1.5 tablets (37.5 mg total) by mouth 2 (two) times daily. 04/18/23   Madilyn Cephas, DO  oxymetazoline  (AFRIN) 0.05 % nasal spray Place 1 spray into both nostrils 2 (two) times daily. For 5 days 03/25/23   Krishnan, Gokul, MD  pantoprazole  (PROTONIX ) 40 MG tablet Take  1 tablet  2 x /day  for Acid Indigestion & Heartburn 04/12/23   Wilkinson, Dana E, NP  sodium chloride  (OCEAN) 0.65 % SOLN nasal spray Place 1 spray into both nostrils as needed for congestion. 03/25/23   Krishnan, Gokul, MD  SYSTANE BALANCE 0.6 % SOLN Place 1 drop into both eyes at bedtime as needed (dry eyes).    [provider]  SYSTANE ULTRA 0.4-0.3 % SOLN Place 1 drop into both eyes daily as needed (dry eyes).    [provider]  TYLENOL  500 MG tablet Take 500-1,000 mg by mouth every 6 (six) hours as needed for mild pain or headache.    [provider]  Zinc 30 MG TABS Take 30 mg by  mouth daily.    [provider]      Allergies    Patient has no known allergies.    Review of Systems   Review of Systems  Physical Exam Updated Vital Signs BP (!) 154/124   Pulse (!) 107   Temp 97.7 F (36.5 C) (Oral)   Resp 14   Ht 5' 2 (1.575 m)   Wt 68.9 kg   SpO2 96%   BMI 27.80 kg/m  Physical Exam Vitals and nursing note reviewed.  Constitutional:      General: She is not in acute distress.    Appearance: She is well-developed. She is not ill-appearing.  HENT:     Head: Normocephalic and atraumatic.     Mouth/Throat:     Mouth: Mucous membranes are moist.  Eyes:     Extraocular Movements: Extraocular movements intact.     Conjunctiva/sclera: Conjunctivae normal.     Pupils: Pupils are equal, round, and reactive to light.  Cardiovascular:     Rate and Rhythm: Tachycardia present. Rhythm irregular.     Heart sounds: No murmur heard. Pulmonary:     Effort: Pulmonary effort is normal. No respiratory distress.     Breath sounds: Normal breath sounds. No decreased breath sounds.  Abdominal:     Palpations: Abdomen is soft.     Tenderness: There is no abdominal tenderness.  Musculoskeletal:        General: No swelling.     Cervical back: Normal range  of motion and neck supple.     Right lower leg: No edema.     Left lower leg: No edema.  Skin:    General: Skin is warm and dry.     Capillary Refill: Capillary refill takes less than 2 seconds.  Neurological:     Mental Status: She is alert.  Psychiatric:        Mood and Affect: Mood normal.     ED Results / Procedures / Treatments   Labs (all labs ordered are listed, but only abnormal results are displayed) Labs Reviewed  CBC WITH DIFFERENTIAL/PLATELET - Abnormal; Notable for the following components:      Result Value   RBC 3.41 (*)    Hemoglobin 11.0 (*)    HCT 33.2 (*)    RDW 15.7 (*)    Platelets 66 (*)    All other components within normal limits  BRAIN NATRIURETIC PEPTIDE - Abnormal;  Notable for the following components:   B Natriuretic Peptide 721.8 (*)    All other components within normal limits  COMPREHENSIVE METABOLIC PANEL - Abnormal; Notable for the following components:   Creatinine, Ser 1.07 (*)    Albumin 3.3 (*)    GFR, Estimated 53 (*)    All other components within normal limits  LIPASE, BLOOD  TROPONIN I (HIGH SENSITIVITY)    EKG None  Radiology DG Chest Port 1 View Result Date: 04/18/2023 CLINICAL DATA:  Chest pain EXAM: PORTABLE CHEST 1 VIEW COMPARISON:  03/23/2023 FINDINGS: Cardiac shadow is enlarged. Aortic calcifications are noted. Elevation of the right hemidiaphragm is noted. Some linear atelectasis is noted in the right base. No acute bony abnormality is noted. Hiatal hernia is noted. IMPRESSION: Mild linear atelectasis in the right base. Electronically Signed   By: Oneil Devonshire M.D.   On: 04/18/2023 19:59    Procedures Procedures    Medications Ordered in ED Medications  metoprolol  tartrate (LOPRESSOR ) tablet 25 mg (25 mg Oral Given 04/18/23 2130)  metoprolol  tartrate (LOPRESSOR ) tablet 12.5 mg (12.5 mg Oral Given 04/18/23 2156)    ED Course/ Medical Decision Making/ A&P                                 Medical Decision Making Amount and/or Complexity of Data Reviewed Labs: ordered. Radiology: ordered.  Risk Prescription drug management.   Jillian Rodgers is here with palpitations.  History of paroxysmal A-fib now currently on Eliquis  metoprolol  and diltiazem .  She has a history of ITP.  She is following closely with cardiology and plan is for likely watchman given that she has ITP and they do not want her on long-term anticoagulation.  However since her platelets have been okay they have kept her on Eliquis  in the meantime.  She was just admitted for A-fib with RVR.  Her heart rate is mostly in the 90-110 range.  She is fairly rate controlled at this time.  She is not having any chest pain or shortness of breath.  There is no signs  of major volume overload but she does have a little edema in her legs.  Vital signs otherwise unremarkable.  No significant anemia or electrolyte abnormality or kidney injury or leukocytosis or troponin on lab work for my review interpretation.  Chest x-ray with no evidence of pneumonia pneumothorax or pleural effusion.  BNP around baseline at 700.  Ultimately I talked with cardiology team, [place].  Overall we will  increase metoprolol  to 37.5 twice daily.  She was given that dose here as she had not had her nighttime metoprolol .  I did recommend that she take a dose of her Lasix  tomorrow and the next day as she takes this medicine as needed as I think that might help get her more rate controlled.  Ultimately her platelets were 66.  We talked about return if she develops any severe bruising or bleeding.  Cardiology already has follow-up with her on Monday.  The plan is for her to get lab work on Friday to continue to evaluate her platelets.  At this time they are comfortable with her staying on her Eliquis .  Overall patient was discharged in good condition.  Understands return precautions.  This chart was dictated using voice recognition software.  Despite best efforts to proofread,  errors can occur which can change the documentation meaning.         Final Clinical Impression(s) / ED Diagnoses Final diagnoses:  Palpitations  Paroxysmal A-fib (HCC)    Rx / DC Orders ED Discharge Orders          Ordered    metoprolol  tartrate (LOPRESSOR ) 25 MG tablet  2 times daily        04/18/23 2246              Ruthe Cornet, DO 04/18/23 2249

## 2023-04-18 NOTE — Discharge Instructions (Addendum)
 Increase your metoprolol  to 37.5 mg twice daily.  This should be 1-1/2 tablets twice a day.  Please use up your remaining prescription.  As we discussed cardiology would like for you to have your blood work checked on Friday and keep your appointment with them on Monday.  If your heart rate is persistently greater than 130-150 range and you are symptomatic please return for evaluation.  Otherwise I also think you should take a dose of your Lasix  tomorrow and the next day to see if that helps as well.  Also if you have any bleeding or significant bruising please return for evaluation as well.

## 2023-04-18 NOTE — Consult Note (Signed)
 BRIEF CARDIOLOGY CONSULTATION   Ms. 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 Ms. Hinson is being seen at [facility] ED. Plan below was made after conversation with Dr. Ruthe and chart review.   Ms. Nylund noticed palpations and checker her heart rate and noticed it get as high as 120 bpm. She presented to the ED today with concerns she converted back into atrial fibrillation. EKG confirms afib with RVR rate 124-80bpm. She is hypertensive, 170/104.   Of note, per chart review, Ms. Boyland has had struggled with ITP, with concurrent thrombocytopenia and need for anticoagulation due to her atrial fibrillation for some time. She was previously on apixaban  earlier this year, however her platelets decreased to 41,000 and it was discontinued. However, when she presented to the hospital in December with afib, her platelet count was elevated at 149k and thus the apixabn was resumed with plans for an outpatient Watchman.  Today she presents with a platelet count of  66k.   Atrial Fibrillation with RVR  Would recommend continuation of her DOAC with her current platelet count of 66k, given her history of TIAs (CHADVASc 5). Her platelets when checked 5 days prior were 64k. However, given the drop in her platelet count in the last two weeks, I would recommend CBC this week and discontinue DOAC if platelets are <50k or signs/symptoms of bleeding.  -Evaluate for petechiae and/or ecchymosis in the ED -Increase home metoprolol  to 37.5 mg daily. Continue diltiazem  180 mg  -Follow up with Dr. Kate next week on 04/23/2023  Merlene Blood, MD MS  [facility] Overnight Cardiology

## 2023-04-18 NOTE — ED Notes (Signed)
 CN and triage RN updated/ notified.

## 2023-04-19 ENCOUNTER — Telehealth: Payer: Self-pay | Admitting: Hematology and Oncology

## 2023-04-19 NOTE — Telephone Encounter (Signed)
 Patient is aware of scheduled appointment times/dates

## 2023-04-20 DIAGNOSIS — I4819 Other persistent atrial fibrillation: Secondary | ICD-10-CM | POA: Diagnosis not present

## 2023-04-20 DIAGNOSIS — D696 Thrombocytopenia, unspecified: Secondary | ICD-10-CM | POA: Diagnosis not present

## 2023-04-21 LAB — CBC
Hematocrit: 34.8 % (ref 34.0–46.6)
Hemoglobin: 11.3 g/dL (ref 11.1–15.9)
MCH: 31.5 pg (ref 26.6–33.0)
MCHC: 32.5 g/dL (ref 31.5–35.7)
MCV: 97 fL (ref 79–97)
Platelets: 81 10*3/uL — CL (ref 150–450)
RBC: 3.59 x10E6/uL — ABNORMAL LOW (ref 3.77–5.28)
RDW: 13.8 % (ref 11.7–15.4)
WBC: 7 10*3/uL (ref 3.4–10.8)

## 2023-04-21 LAB — BASIC METABOLIC PANEL
BUN/Creatinine Ratio: 16 (ref 12–28)
BUN: 23 mg/dL (ref 8–27)
CO2: 23 mmol/L (ref 20–29)
Calcium: 9.8 mg/dL (ref 8.7–10.3)
Chloride: 100 mmol/L (ref 96–106)
Creatinine, Ser: 1.45 mg/dL — ABNORMAL HIGH (ref 0.57–1.00)
Glucose: 99 mg/dL (ref 70–99)
Potassium: 4.8 mmol/L (ref 3.5–5.2)
Sodium: 140 mmol/L (ref 134–144)
eGFR: 37 mL/min/{1.73_m2} — ABNORMAL LOW (ref >59)

## 2023-04-22 NOTE — Progress Notes (Addendum)
 Cardiology Office Note:    Date:  11/01/2023   ID:  Jillian Rodgers, DOB 1945/11/24, MRN [id number]  PCP:  [name], MD  Cardiologist:  Lonni LITTIE Nanas, MD  Electrophysiologist:  OLE ONEIDA HOLTS, MD   Referring MD: Tonita Fallow, MD   Chief Complaint  Patient presents with   Atrial Fibrillation    History of Present Illness:    Jillian Rodgers is a 78 y.o. female with a hx of paroxysmal atrial fibrillation, chronic Farma cytopenia, TIA, CKD stage III, hypertension, hypothyroidism, hyperlipidemia who presents for follow-up.  She was diagnosed with A-fib 10/2020, underwent successful DCCV at that time.  She was taken off anticoagulation due to chronic soft thrombocytopenia.  Admitted 10/2022 with TIA.  She was admitted 12/2022 with A-fib with RVR.  During admission, her platelets were stable in 60s and in discussion with hematology she tolerated heparin  drip she was transitioned to Eliquis .  She spontaneously converted to normal sinus rhythm during admission and was discharged on Eliquis  and metoprolol .  At follow-up in 01/2023, her platelet count had declined to 30s and Eliquis  was held.  Underwent steroid treatment for thrombocytopenia on 02/2023 and IVIG 03/2023 with improvement in platelet count and restarted on Eliquis .  She was admitted 03/2023 with recurrent A-fib with RVR, was continued on Eliquis  5 mg twice daily, and she spontaneously converted to sinus rhythm during admission.  She was seen by Dr. HOLTS on 04/17/2023, planning A-fib ablation/Watchman procedure.  She was admitted with recurrent A-fib 04/18/2023.  Denies any palpitations since that time.  Did report occasional chest pain and shortness of breath. Has been feeling fatigued.    Past Medical History:  Diagnosis Date   Anemia with low platelet count (HCC)    Chronic kidney disease    Degenerative joint disease    Dysrhythmia    a fib   GERD (gastroesophageal reflux disease)    History of kidney  stones    Hyperlipidemia    Hypothyroidism    Inguinal hernia 02/19/2019   Labile hypertension    Migraines    neurontin  helps   Pneumonia    hx of walking pneumonia    Primary hypertension 04/28/2022    Past Surgical History:  Procedure Laterality Date   BILATERAL CATARACT SURGERY      BLERPHOROPLASTY \     CHOLECYSTECTOMY  1996   open   COLONOSCOPY  2009   recommended 10 year f/u   HERNIA REPAIR  1985   umb hernia rpr   HERNIA REPAIR  2005 & 2009   ventral hernia rpr   INGUINAL HERNIA REPAIR Left 04/30/2020   Procedure: OPEN LEFT INGUINAL HERNIA REPAIR WITH MESH;  Surgeon: Signe Mitzie DELENA, MD;  Location: [facility];  Service: General;  Laterality: Left;   INGUINAL HERNIA REPAIR Right 10/24/2021   Procedure: OPEN RIGHT INGUINAL HERNIA REPAIR WITH MESH ;  Surgeon: Signe Mitzie DELENA, MD;  Location: [facility];  Service: General;  Laterality: Right;   PILONIDAL CYST EXCISION  1972   SKIN CANCER REMOVAL      TONSILLECTOMY     UPPER GI ENDOSCOPY     VENTRAL HERNIA REPAIR      Current Medications: Current Meds  Medication Sig   Ascorbic Acid (VITAMIN C) 500 MG CAPS Take 500 mg by mouth daily.    BOTOX 100 units SOLR injection Inject 100 Units into the muscle See admin instructions. Inject 100 units intramuscularly every 6-8 weeks   cetirizine (ZYRTEC) 10 MG  tablet Take 10 mg by mouth daily.   Cholecalciferol (VITAMIN D3) 125 MCG (5000 UT) TABS Take 5,000 Units by mouth daily.   furosemide  (LASIX ) 20 MG tablet TAKE 1 TABLET(20 MG) BY MOUTH DAILY AS NEEDED   levothyroxine  (SYNTHROID ) 50 MCG tablet Take  1 tablet  Daily  on an empty stomach with only water for 30 minutes & no Antacid meds, Calcium  or Magnesium  for 4 hours & avoid Biotin                                                                                                                    /                                                                   TAKE                                         BY                                                  MOUTH (Patient taking differently: Take 50 mcg by mouth See admin instructions. Take 50 mcg by mouth in the morning on an empty stomach, with only water. Take no antacids, calcium , or magnesium  for 4 hours and avoid biotin.)   Magnesium  Oxide 400 MG CAPS Take 1 capsule (400 mg total) by mouth at bedtime.   pantoprazole  (PROTONIX ) 40 MG tablet Take  1 tablet  2 x /day  for Acid Indigestion & Heartburn   sodium chloride  (OCEAN) 0.65 % SOLN nasal spray Place 1 spray into both nostrils as needed for congestion. (Patient not taking: Reported on 10/30/2023)   SYSTANE BALANCE 0.6 % SOLN Place 1 drop into both eyes at bedtime as needed (dry eyes).   SYSTANE ULTRA 0.4-0.3 % SOLN Place 1 drop into both eyes daily as needed (dry eyes).   TYLENOL  500 MG tablet Take 500-1,000 mg by mouth every 6 (six) hours as needed for mild pain or headache.   Zinc 30 MG TABS Take 30 mg by mouth daily.   [DISCONTINUED] apixaban  (ELIQUIS ) 5 MG TABS tablet Take 1 tablet (5 mg total) by mouth 2 (two) times daily.   [DISCONTINUED] atorvastatin  (LIPITOR ) 80 MG tablet Take 1 tablet (80 mg total) by mouth daily.   [DISCONTINUED] diltiazem  (CARDIZEM  CD) 180 MG 24 hr capsule Take 1 capsule (180 mg total) by mouth daily.   [DISCONTINUED] divalproex  (DEPAKOTE  ER) 500 MG 24 hr tablet Take 1 tablet (500 mg total) by mouth  daily.   [DISCONTINUED] gabapentin  (NEURONTIN ) 300 MG capsule Take 300 mg (1 tablet) in the morning and 600 mg (2 tablets) at bedtime   [DISCONTINUED] metoprolol  tartrate (LOPRESSOR ) 25 MG tablet Take 1.5 tablets (37.5 mg total) by mouth 2 (two) times daily.   [DISCONTINUED] oxymetazoline  (AFRIN) 0.05 % nasal spray Place 1 spray into both nostrils 2 (two) times daily. For 5 days (Patient not taking: Reported on 09/12/2023)     Allergies:   Patient has no known allergies.   Social History   Socioeconomic History   Marital status: Married    Spouse name: Not on file   Number of children: 2   Years  of education: 18   Highest education level: Not on file  Occupational History   Not on file  Tobacco Use   Smoking status: Never   Smokeless tobacco: Never  Vaping Use   Vaping status: Never Used  Substance and Sexual Activity   Alcohol  use: No   Drug use: No   Sexual activity: Yes  Other Topics Concern   Not on file  Social History Narrative   Live with husband    Retired    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  Food Insecurity: No Food Insecurity (03/25/2023)   Hunger Vital Sign    Worried About Running Out of Food in the Last Year: Never true    Ran Out of Food in the Last Year: Never true  Transportation Needs: No Transportation Needs (03/25/2023)   PRAPARE - Administrator, Civil Service (Medical): No    Lack of Transportation (Non-Medical): No  Physical Activity: Not on file  Stress: Not on file  Social Connections: Not on file     Family History: The patient's family history includes Breast cancer in her sister; Cancer in her mother; Congestive Heart Failure in her father; Gout in her maternal aunt; Heart disease in her mother; Hypertension in her mother; Ovarian cancer in her mother; Stroke in her paternal uncle; Uterine cancer in her mother. There is no history of Colon cancer, Esophageal cancer, Stomach cancer, or Rectal cancer.  ROS:   Please see the history of present illness.     All other systems reviewed and are negative.  EKGs/Labs/Other Studies Reviewed:    The following studies were reviewed today:   EKG:   04/22/2022: Sinus bradycardia, rate 55, no ST abnormalities  Recent Labs: 03/23/2023: TSH 3.248 03/25/2023: Magnesium  2.2 04/18/2023: B Natriuretic Peptide 721.8 10/04/2023: ALT 12 10/31/2023: BUN 33; Creatinine, Ser 1.33; Hemoglobin 11.8; Platelets 95; Potassium 4.6; Sodium 138  Recent Lipid Panel    Component Value Date/Time   CHOL 143 01/03/2023 1533   TRIG 94 01/03/2023 1533   HDL 72 01/03/2023 1533    CHOLHDL 2.0 01/03/2023 1533   VLDL 34 (H) 09/26/2016 1147   LDLCALC 53 01/03/2023 1533    Physical Exam:    VS:  BP 110/72 (BP Location: Right Arm, Patient Position: Sitting, Cuff Size: Normal)   Pulse (!) 55   Ht 5' 4 (1.626 m)   Wt 159 lb 9.6 oz (72.4 kg)   SpO2 94%   BMI 27.40 kg/m     Wt Readings from Last 3 Encounters:  10/04/23 160 lb 3.2 oz (72.7 kg)  09/05/23 159 lb 4.8 oz (72.3 kg)  07/27/23 156 lb 6.4 oz (70.9 kg)     GEN:  Well nourished, well developed in no acute distress HEENT: Normal NECK: No JVD;  No carotid bruits LYMPHATICS: No lymphadenopathy CARDIAC: RRR, no murmurs, rubs, gallops RESPIRATORY:  Clear to auscultation without rales, wheezing or rhonchi  ABDOMEN: Soft, non-tender, non-distended MUSCULOSKELETAL:  trace edema SKIN: Warm and dry NEUROLOGIC:  Alert and oriented x 3 PSYCHIATRIC:  Normal affect   ASSESSMENT:    1. Paroxysmal atrial fibrillation (HCC)   2. Thrombocytopenia (HCC)   3. Primary hypertension   4. OSA (obstructive sleep apnea)    PLAN:    Paroxysmal atrial fibrillation: Complicated situation, has had recurrent admissions for A-fib with RVR but has been unable to tolerate anticoagulation despite high CHA2DS2-VASc score (6, hypertension, age x 2, TIA, female) due to chronic thrombocytopenia.  Echocardiogram 12/2022 showed EF 60 to 65%, normal RV function, severe left atrial enlargement, no significant valvular disease -Seen by Dr. Cindie in EP, planning ablation/Watchman procedure -Continue Eliquis  5 mg twice daily for now -Continue metoprolol  37.5 mg twice daily and cardizem  180 mg daily  [facility] Referral for Left Atrial Appendage Closure with Non-Valvular Atrial Fibrillation   Jillian Rodgers is a 78 y.o. female is being referred to the [facility] Team for evaluation for Left Atrial Appendage Closure with Watchman device for the management of stroke risk resulting form non-valvular atrial fibrillation.     Base upon Ms. Santo's history, she is felt to be a poor candidate for long-term anticoagulation because of increased bleeding risk (e.g. thrombocytopenia, cancer, risk of tumor associated bleeding in case of systemic anticoagulation).  The patient has a HAS-BLED score of 3 indicating a Yearly Major Bleeding Risk of 3.74%.      Her CHADS2-VASc Score is 6 with an unadjusted Ischemic Stroke Rate (% per year) of 9.7%.    Her stroke risk necessitates a strategy of stroke prevention with either long-term oral anticoagulation or left atrial appendage occlusion therapy. We have discussed their bleeding risk in the context of their comorbid medical problems, as well as the rationale for referral for evaluation of Watchman left atrial appendage occlusion therapy. While the patient is at high long-term bleeding risk, they may be appropriate for short-term anticoagulation. Based on this individual patient's stroke and bleeding risk, a shared decision has been made to refer the patient for consideration of Watchman left atrial appendage closure utilizing the [employer] [facility] [employer] shared decision tool.   Chronic thrombocytopenia: Follows with hematology, likely ITP.  Treated with steroids 02/2023 and IVIG 03/2023, with improvement in counts.  Most recent platelets 81 on 04/20/2023  Lower extremity edema: On as needed Lasix  20 mg, takes about every other day.  Suspect venous insufficiency, compression stockings recommended  Hypertension: Continue metoprolol  and diltiazem , appears controlled  TIA: Continue atorvastatin , Eliquis   OSA: Has known OSA but has not been on CPAP.  Itamar ordered last year but states she was not called to activate device.  Will message sleep coordinator  RTC in 3 months   Medication Adjustments/Labs and Tests Ordered: Current medicines are reviewed at length with the patient today.  Concerns regarding medicines are outlined above.  Orders Placed This  Encounter  Procedures   EKG 12-Lead   No orders of the defined types were placed in this encounter.   Patient Instructions  Medication Instructions:  Continue current medications *If you need a refill on your cardiac medications before your next appointment, please call your pharmacy*   Lab Work: none If you have labs (blood work) drawn today and your tests are completely normal, you will receive your results only  by: MyChart Message (if you have MyChart) OR A paper copy in the mail If you have any lab test that is abnormal or we need to change your treatment, we will call you to review the results.   Testing/Procedures: none   Follow-Up: At [facility], you and your health needs are our priority.  As part of our continuing mission to provide you with exceptional heart care, we have created designated Provider Care Teams.  These Care Teams include your primary Cardiologist (physician) and Advanced Practice Providers (APPs -  Physician Assistants and Nurse Practitioners) who all work together to provide you with the care you need, when you need it.  We recommend signing up for the patient portal called MyChart.  Sign up information is provided on this After Visit Summary.  MyChart is used to connect with patients for Virtual Visits (Telemedicine).  Patients are able to view lab/test results, encounter notes, upcoming appointments, etc.  Non-urgent messages can be sent to your provider as well.   To learn more about what you can do with MyChart, go to [email or web address].    Your next appointment:   3 month(s)  Provider:   Lonni LITTIE Nanas, MD     Other Instructions none        Signed, Lamarr Hummer, PA-C  11/01/2023 9:06 AM    [facility]

## 2023-04-23 ENCOUNTER — Ambulatory Visit: Payer: Medicare PPO | Attending: Cardiology | Admitting: Cardiology

## 2023-04-23 ENCOUNTER — Encounter: Payer: Self-pay | Admitting: Cardiology

## 2023-04-23 VITALS — BP 110/72 | HR 55 | Ht 64.0 in | Wt 159.6 lb

## 2023-04-23 DIAGNOSIS — G4733 Obstructive sleep apnea (adult) (pediatric): Secondary | ICD-10-CM

## 2023-04-23 DIAGNOSIS — I480 Paroxysmal atrial fibrillation: Secondary | ICD-10-CM

## 2023-04-23 DIAGNOSIS — D696 Thrombocytopenia, unspecified: Secondary | ICD-10-CM | POA: Diagnosis not present

## 2023-04-23 DIAGNOSIS — I10 Essential (primary) hypertension: Secondary | ICD-10-CM

## 2023-04-23 NOTE — Patient Instructions (Signed)
 Medication Instructions:  Continue current medications *If you need a refill on your cardiac medications before your next appointment, please call your pharmacy*   Lab Work: none If you have labs (blood work) drawn today and your tests are completely normal, you will receive your results only by: MyChart Message (if you have MyChart) OR A paper copy in the mail If you have any lab test that is abnormal or we need to change your treatment, we will call you to review the results.   Testing/Procedures: none   Follow-Up: At [facility], you and your health needs are our priority.  As part of our continuing mission to provide you with exceptional heart care, we have created designated Provider Care Teams.  These Care Teams include your primary Cardiologist (physician) and Advanced Practice Providers (APPs -  Physician Assistants and Nurse Practitioners) who all work together to provide you with the care you need, when you need it.  We recommend signing up for the patient portal called MyChart.  Sign up information is provided on this After Visit Summary.  MyChart is used to connect with patients for Virtual Visits (Telemedicine).  Patients are able to view lab/test results, encounter notes, upcoming appointments, etc.  Non-urgent messages can be sent to your provider as well.   To learn more about what you can do with MyChart, go to [email or web address].    Your next appointment:   3 month(s)  Provider:   Lonni LITTIE Nanas, MD     Other Instructions none

## 2023-04-25 ENCOUNTER — Inpatient Hospital Stay: Payer: Medicare PPO | Attending: Hematology and Oncology

## 2023-04-25 DIAGNOSIS — Z8041 Family history of malignant neoplasm of ovary: Secondary | ICD-10-CM | POA: Insufficient documentation

## 2023-04-25 DIAGNOSIS — Z79899 Other long term (current) drug therapy: Secondary | ICD-10-CM | POA: Insufficient documentation

## 2023-04-25 DIAGNOSIS — Z9181 History of falling: Secondary | ICD-10-CM | POA: Insufficient documentation

## 2023-04-25 DIAGNOSIS — I4891 Unspecified atrial fibrillation: Secondary | ICD-10-CM | POA: Diagnosis not present

## 2023-04-25 DIAGNOSIS — Z803 Family history of malignant neoplasm of breast: Secondary | ICD-10-CM | POA: Diagnosis not present

## 2023-04-25 DIAGNOSIS — D693 Immune thrombocytopenic purpura: Secondary | ICD-10-CM

## 2023-04-25 DIAGNOSIS — Z808 Family history of malignant neoplasm of other organs or systems: Secondary | ICD-10-CM | POA: Diagnosis not present

## 2023-04-25 DIAGNOSIS — D696 Thrombocytopenia, unspecified: Secondary | ICD-10-CM | POA: Insufficient documentation

## 2023-04-25 DIAGNOSIS — Z809 Family history of malignant neoplasm, unspecified: Secondary | ICD-10-CM | POA: Diagnosis not present

## 2023-04-25 DIAGNOSIS — Z7901 Long term (current) use of anticoagulants: Secondary | ICD-10-CM | POA: Diagnosis not present

## 2023-04-25 LAB — CBC WITH DIFFERENTIAL/PLATELET
Abs Immature Granulocytes: 0.03 10*3/uL (ref 0.00–0.07)
Basophils Absolute: 0 10*3/uL (ref 0.0–0.1)
Basophils Relative: 1 %
Eosinophils Absolute: 0.1 10*3/uL (ref 0.0–0.5)
Eosinophils Relative: 1 %
HCT: 32.6 % — ABNORMAL LOW (ref 36.0–46.0)
Hemoglobin: 10.9 g/dL — ABNORMAL LOW (ref 12.0–15.0)
Immature Granulocytes: 1 %
Lymphocytes Relative: 24 %
Lymphs Abs: 1.4 10*3/uL (ref 0.7–4.0)
MCH: 32.2 pg (ref 26.0–34.0)
MCHC: 33.4 g/dL (ref 30.0–36.0)
MCV: 96.2 fL (ref 80.0–100.0)
Monocytes Absolute: 0.8 10*3/uL (ref 0.1–1.0)
Monocytes Relative: 13 %
Neutro Abs: 3.6 10*3/uL (ref 1.7–7.7)
Neutrophils Relative %: 60 %
Platelets: 68 10*3/uL — ABNORMAL LOW (ref 150–400)
RBC: 3.39 MIL/uL — ABNORMAL LOW (ref 3.87–5.11)
RDW: 14.6 % (ref 11.5–15.5)
WBC: 5.9 10*3/uL (ref 4.0–10.5)
nRBC: 0 % (ref 0.0–0.2)

## 2023-05-02 ENCOUNTER — Inpatient Hospital Stay (HOSPITAL_BASED_OUTPATIENT_CLINIC_OR_DEPARTMENT_OTHER): Payer: Medicare PPO | Admitting: Hematology and Oncology

## 2023-05-02 ENCOUNTER — Ambulatory Visit: Payer: Medicare PPO | Admitting: Internal Medicine

## 2023-05-02 ENCOUNTER — Encounter: Payer: Self-pay | Admitting: Hematology and Oncology

## 2023-05-02 VITALS — BP 110/64 | HR 73 | Temp 97.6°F | Resp 16 | Wt 159.1 lb

## 2023-05-02 DIAGNOSIS — Z7901 Long term (current) use of anticoagulants: Secondary | ICD-10-CM | POA: Diagnosis not present

## 2023-05-02 DIAGNOSIS — I4891 Unspecified atrial fibrillation: Secondary | ICD-10-CM | POA: Diagnosis not present

## 2023-05-02 DIAGNOSIS — D693 Immune thrombocytopenic purpura: Secondary | ICD-10-CM | POA: Diagnosis not present

## 2023-05-02 DIAGNOSIS — Z8041 Family history of malignant neoplasm of ovary: Secondary | ICD-10-CM | POA: Diagnosis not present

## 2023-05-02 DIAGNOSIS — Z809 Family history of malignant neoplasm, unspecified: Secondary | ICD-10-CM | POA: Diagnosis not present

## 2023-05-02 DIAGNOSIS — Z808 Family history of malignant neoplasm of other organs or systems: Secondary | ICD-10-CM | POA: Diagnosis not present

## 2023-05-02 DIAGNOSIS — Z9181 History of falling: Secondary | ICD-10-CM | POA: Diagnosis not present

## 2023-05-02 DIAGNOSIS — Z803 Family history of malignant neoplasm of breast: Secondary | ICD-10-CM | POA: Diagnosis not present

## 2023-05-02 DIAGNOSIS — D696 Thrombocytopenia, unspecified: Secondary | ICD-10-CM | POA: Diagnosis not present

## 2023-05-02 DIAGNOSIS — Z79899 Other long term (current) drug therapy: Secondary | ICD-10-CM | POA: Diagnosis not present

## 2023-05-02 NOTE — Progress Notes (Signed)
 [facility] Cancer Follow up:    Jillian Genet, MD [address] [place] [address]   DIAGNOSIS: Thrombocytopenia  SUMMARY OF HEMATOLOGIC HISTORY:  Underwent thrombocytopenia lab evaluation inn November 2021 without no evidence of hepatitis, hypothyroidism, nutritional deficiencies.  MR of the abdomen from May 2021 shows no evidence of liver disease or splenomegaly.  Bone marrow biopsy from 03/19/2020 showed hypercellularity, no evidence of dyspoeisis, megakaryocytic hyperplasia. Flow cytometry negative for any monoclonal B cell population. Cytogenetics were normal.  Bone marrow biopsy 02/13/2023, overall normocellular marrow with orderly trilineage hematopoiesis and megakaryocytic hyperplasia. 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 Cytogenetics showed normal female karyotype.  Steroids 02/22/2023 which seemed to improve the platelet count but led to a slump in energy levels after discontinuation-minimal improvement in platelet count IVIG 03/19/2023 and 03/22/2023  CURRENT THERAPY: Observation  INTERVAL HISTORY: Discussed the use of AI scribe software for clinical note transcription with the patient, who gave verbal consent to proceed.  History of Present Illness    The patient, with a history of atrial fibrillation and thrombocytopenia, is scheduled for a Watchman procedure and ablation. The procedure has been delayed until March, causing concern for the patient's spouse. The patient has been experiencing minor nosebleeds, which she manages with a nasal spray. She also reports a recent fall due to a rug, but no significant injuries were sustained. The patient's platelet count has been stable around 68,000. She is currently taking Eliquis  and metoprolol  for the atrial fibrillation.  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  MEDICAL HISTORY: Past Medical History:  Diagnosis Date   Anemia with low platelet count (HCC)    Chronic kidney disease    Degenerative joint disease    Dysrhythmia    a fib   GERD (gastroesophageal reflux disease)    History of kidney stones    Hyperlipidemia    Hypothyroidism    Inguinal hernia 02/19/2019   Labile hypertension    Migraines    neurontin  helps   Pneumonia    hx of walking pneumonia    Primary hypertension 04/28/2022    SURGICAL HISTORY: Past Surgical History:  Procedure Laterality Date   BILATERAL CATARACT SURGERY      BLERPHOROPLASTY \     CHOLECYSTECTOMY  1996   open   COLONOSCOPY  2009   recommended 10 year f/u   HERNIA REPAIR  1985   umb hernia rpr   HERNIA REPAIR  2005 & 2009   ventral hernia rpr   INGUINAL HERNIA REPAIR Left 04/30/2020   Procedure: OPEN LEFT INGUINAL HERNIA REPAIR WITH MESH;  Surgeon: Jillian Acton, MD;  Location: [facility];  Service: General;  Laterality: Left;    INGUINAL HERNIA REPAIR Right 10/24/2021   Procedure: OPEN RIGHT INGUINAL HERNIA REPAIR WITH MESH ;  Surgeon: Jillian Acton, MD;  Location: [facility];  Service: General;  Laterality: Right;   PILONIDAL CYST EXCISION  1972   SKIN CANCER REMOVAL      TONSILLECTOMY     UPPER GI ENDOSCOPY     VENTRAL HERNIA REPAIR      SOCIAL HISTORY: Social History   Socioeconomic History   Marital status: Married    Spouse name: Not on file   Number of children: 2   Years of education: 18   Highest education level: Not on file  Occupational History   Not on file  Tobacco Use   Smoking status: Never   Smokeless tobacco: Never  Vaping Use   Vaping status: Never Used  Substance and Sexual Activity   Alcohol  use: No   Drug use: No   Sexual activity: Yes  Other Topics Concern   Not on file  Social History Narrative   Live with husband    Retired    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  Food Insecurity: No Food Insecurity (03/25/2023)   Hunger Vital Sign    Worried About Running Out of Food in the Last Year: Never true    Ran Out of Food in the Last Year: Never true  Transportation Needs: No Transportation Needs (03/25/2023)   PRAPARE - Administrator, Civil Service (Medical): No    Lack of Transportation (Non-Medical): No  Physical Activity: Not on file  Stress: Not on file  Social Connections: Not on file  Intimate Partner Violence: Not At Risk (03/25/2023)   Humiliation, Afraid, Rape, and Kick questionnaire    Fear of Current or Ex-Partner: No    Emotionally Abused: No    Physically Abused: No    Sexually Abused: No    FAMILY HISTORY: Family History  Problem Relation Age of Onset   Ovarian cancer Mother    Heart disease Mother    Cancer Mother    Uterine cancer Mother    Hypertension Mother    Breast cancer Sister    Stroke Paternal Uncle    Congestive Heart Failure Father    Gout Maternal Aunt    Colon cancer Neg Hx    Esophageal  cancer Neg Hx    Stomach cancer Neg Hx    Rectal cancer Neg Hx     Review of Systems  Constitutional:  Negative for appetite change, chills, fatigue, fever and unexpected weight change.  HENT:   Negative for hearing loss, lump/mass and trouble swallowing.   Eyes:  Negative for eye problems and icterus.  Respiratory:  Negative for chest tightness, cough and shortness of breath.   Cardiovascular:  Negative for chest pain, leg swelling and palpitations.  Gastrointestinal:  Negative for abdominal distention, abdominal pain, constipation, diarrhea, nausea and vomiting.  Endocrine: Negative for hot flashes.  Genitourinary:  Negative for difficulty urinating.   Musculoskeletal:  Negative for arthralgias.  Skin:  Negative for itching and rash.  Neurological:  Negative for dizziness, extremity weakness, headaches and numbness.  Hematological:  Negative for adenopathy. Does not bruise/bleed easily.  Psychiatric/Behavioral:  Negative for depression. The  patient is not nervous/anxious.   All other systems reviewed and are negative.     PHYSICAL EXAMINATION    Vitals:   05/02/23 1156  BP: 110/64  Pulse: 73  Resp: 16  Temp: 97.6 F (36.4 C)  SpO2: 90%   Alert, oriented no acute distress No cervical or axillary adneopathy Chest: CTA bilaterally Heart: RRR No LE edema.  LABORATORY DATA:  CBC    Component Value Date/Time   WBC 5.9 04/25/2023 1451   RBC 3.39 (L) 04/25/2023 1451   HGB 10.9 (L) 04/25/2023 1451   HGB 11.3 04/20/2023 1445   HCT 32.6 (L) 04/25/2023 1451   HCT 34.8 04/20/2023 1445   PLT 68 (L) 04/25/2023 1451   PLT 81 (LL) 04/20/2023 1445   MCV 96.2 04/25/2023 1451   MCV 97 04/20/2023 1445   MCH 32.2 04/25/2023 1451   MCHC 33.4 04/25/2023 1451   RDW 14.6 04/25/2023 1451   RDW 13.8 04/20/2023 1445   LYMPHSABS 1.4 04/25/2023 1451   MONOABS 0.8 04/25/2023 1451   EOSABS 0.1 04/25/2023 1451   BASOSABS 0.0 04/25/2023 1451    CMP     Component Value Date/Time    NA 140 04/20/2023 1445   K 4.8 04/20/2023 1445   CL 100 04/20/2023 1445   CO2 23 04/20/2023 1445   GLUCOSE 99 04/20/2023 1445   GLUCOSE 97 04/18/2023 1910   BUN 23 04/20/2023 1445   CREATININE 1.45 (H) 04/20/2023 1445   CREATININE 1.24 (H) 03/09/2023 1204   CREATININE 1.27 (H) 01/18/2023 1219   CALCIUM  9.8 04/20/2023 1445   PROT 7.2 04/18/2023 1910   ALBUMIN 3.3 (L) 04/18/2023 1910   AST 25 04/18/2023 1910   AST 16 03/09/2023 1204   ALT 28 04/18/2023 1910   ALT 13 03/09/2023 1204   ALKPHOS 55 04/18/2023 1910   BILITOT 0.5 04/18/2023 1910   BILITOT 0.6 03/09/2023 1204   GFRNONAA 53 (L) 04/18/2023 1910   GFRNONAA 44 (L) 03/09/2023 1204   GFRNONAA 45 (L) 10/20/2020 1542   GFRAA 52 (L) 10/20/2020 1542      ASSESSMENT and THERAPY PLAN:   Atrial Fibrillation Scheduled for ablation and Watchman device placement in March. Currently on Eliquis  and Metoprolol  for rate control. Platelet count stable at 68,000, which is above the threshold for safe procedure. -Continue current medications. -Check platelet count prior to procedure. If below 50,000, consider IVIG to boost platelet count.  Thrombocytopenia Platelet count stable at 68,000. Responds well to IVIG.  Epistaxis Minor nosebleeds, managed with nasal spray. -Continue current management.  Follow-up in 1 month or after return from vacation in late February. Check platelet count at that time.  *Total Encounter Time as defined by the Centers for Medicare and Medicaid Services includes, in addition to the face-to-face time of a patient visit (documented in the note above) non-face-to-face time: obtaining and reviewing outside history, ordering and reviewing medications, tests or procedures, care coordination (communications with other health care professionals or caregivers) and documentation in the medical record.

## 2023-05-08 ENCOUNTER — Ambulatory Visit
Admission: RE | Admit: 2023-05-08 | Discharge: 2023-05-08 | Disposition: A | Discharge status: Home / Self Care | Payer: Medicare PPO | Source: Ambulatory Visit | Attending: Nurse Practitioner | Admitting: Nurse Practitioner

## 2023-05-08 DIAGNOSIS — N958 Other specified menopausal and perimenopausal disorders: Secondary | ICD-10-CM | POA: Diagnosis not present

## 2023-05-08 DIAGNOSIS — M816 Localized osteoporosis [Lequesne]: Secondary | ICD-10-CM

## 2023-05-08 DIAGNOSIS — E2839 Other primary ovarian failure: Secondary | ICD-10-CM | POA: Diagnosis not present

## 2023-05-08 DIAGNOSIS — M8588 Other specified disorders of bone density and structure, other site: Secondary | ICD-10-CM | POA: Diagnosis not present

## 2023-05-10 ENCOUNTER — Ambulatory Visit: Payer: Medicare PPO | Admitting: Nurse Practitioner

## 2023-05-16 ENCOUNTER — Telehealth: Payer: Self-pay | Admitting: Cardiology

## 2023-05-16 MED ORDER — METOPROLOL TARTRATE 25 MG PO TABS: 37.5000 mg | ORAL_TABLET | Freq: Two times a day (BID) | ORAL | 3 refills | Status: DC

## 2023-05-16 NOTE — Telephone Encounter (Signed)
90 *STAT* If patient is at the pharmacy, call can be transferred to refill team.   1. Which medications need to be refilled? (please list name of each medication and dose if known) metoprolol tartrate (LOPRESSOR) 25 MG tablet   2. Which pharmacy/location (including street and city if local pharmacy) is medication to be sent to?  [id number] [place], [place] - [address] AT [facility] [address] [phone]  3. Do they need a 30 day or 90 day supply? 90

## 2023-05-17 ENCOUNTER — Encounter: Payer: Self-pay | Admitting: Nurse Practitioner

## 2023-05-17 DIAGNOSIS — M542 Cervicalgia: Secondary | ICD-10-CM | POA: Diagnosis not present

## 2023-05-17 DIAGNOSIS — M791 Myalgia, unspecified site: Secondary | ICD-10-CM | POA: Diagnosis not present

## 2023-05-17 DIAGNOSIS — G518 Other disorders of facial nerve: Secondary | ICD-10-CM | POA: Diagnosis not present

## 2023-05-17 DIAGNOSIS — G43019 Migraine without aura, intractable, without status migrainosus: Secondary | ICD-10-CM | POA: Diagnosis not present

## 2023-05-21 ENCOUNTER — Other Ambulatory Visit: Payer: Self-pay | Admitting: Cardiology

## 2023-05-21 DIAGNOSIS — I480 Paroxysmal atrial fibrillation: Secondary | ICD-10-CM

## 2023-05-21 MED ORDER — APIXABAN 5 MG PO TABS: 5.0000 mg | ORAL_TABLET | Freq: Two times a day (BID) | ORAL | 2 refills | Status: DC

## 2023-05-21 NOTE — Telephone Encounter (Signed)
*  STAT* If patient is at the pharmacy, call can be transferred to refill team.   1. Which medications need to be refilled? (please list name of each medication and dose if known) apixaban (ELIQUIS) 5 MG TABS tablet    4. Which pharmacy/location (including street and city if local pharmacy) is medication to be sent to? [id number] - [place], [place] - [address] AT [facility] [address]     5. Do they need a 30 day or 90 day supply? 90

## 2023-06-15 ENCOUNTER — Telehealth: Payer: Self-pay

## 2023-06-15 ENCOUNTER — Other Ambulatory Visit: Payer: Self-pay

## 2023-06-15 MED ORDER — ATORVASTATIN CALCIUM 80 MG PO TABS: 80.0000 mg | ORAL_TABLET | Freq: Every day | ORAL | 3 refills | Status: AC

## 2023-06-15 MED ORDER — DIVALPROEX SODIUM ER 500 MG PO TB24: 500.0000 mg | ORAL_TABLET | Freq: Every day | ORAL | 3 refills | Status: DC

## 2023-06-15 MED ORDER — GABAPENTIN 300 MG PO CAPS: ORAL_CAPSULE | ORAL | 0 refills | Status: AC

## 2023-06-15 NOTE — Telephone Encounter (Signed)
 Spoke with patient and confirmed appointment on 06/18/23

## 2023-06-18 ENCOUNTER — Inpatient Hospital Stay: Payer: Medicare PPO

## 2023-06-18 ENCOUNTER — Telehealth: Payer: Self-pay | Admitting: Hematology and Oncology

## 2023-06-18 ENCOUNTER — Inpatient Hospital Stay: Payer: Medicare PPO | Attending: Hematology and Oncology | Admitting: Hematology and Oncology

## 2023-06-18 VITALS — BP 147/79 | HR 62 | Temp 98.2°F | Resp 17 | Wt 162.1 lb

## 2023-06-18 DIAGNOSIS — D693 Immune thrombocytopenic purpura: Secondary | ICD-10-CM

## 2023-06-18 LAB — CBC WITH DIFFERENTIAL/PLATELET
Abs Immature Granulocytes: 0.05 10*3/uL (ref 0.00–0.07)
Basophils Absolute: 0.1 10*3/uL (ref 0.0–0.1)
Basophils Relative: 1 %
Eosinophils Absolute: 0.1 10*3/uL (ref 0.0–0.5)
Eosinophils Relative: 2 %
HCT: 35.3 % — ABNORMAL LOW (ref 36.0–46.0)
Hemoglobin: 11.4 g/dL — ABNORMAL LOW (ref 12.0–15.0)
Immature Granulocytes: 1 %
Lymphocytes Relative: 28 %
Lymphs Abs: 2 10*3/uL (ref 0.7–4.0)
MCH: 31.1 pg (ref 26.0–34.0)
MCHC: 32.3 g/dL (ref 30.0–36.0)
MCV: 96.4 fL (ref 80.0–100.0)
Monocytes Absolute: 1 10*3/uL (ref 0.1–1.0)
Monocytes Relative: 14 %
Neutro Abs: 3.9 10*3/uL (ref 1.7–7.7)
Neutrophils Relative %: 54 %
Platelets: 39 10*3/uL — ABNORMAL LOW (ref 150–400)
RBC: 3.66 MIL/uL — ABNORMAL LOW (ref 3.87–5.11)
RDW: 12.7 % (ref 11.5–15.5)
WBC: 7 10*3/uL (ref 4.0–10.5)
nRBC: 0 % (ref 0.0–0.2)

## 2023-06-18 NOTE — Telephone Encounter (Signed)
 Patient is aware of location,dates, and timeframe of scheduled infusions at our [facility] location, patient is aware of scheduled appointment times/dates for follow up appointment with provider as well

## 2023-06-18 NOTE — Progress Notes (Signed)
 [facility] Cancer Follow up:    Jillian Cowboy, MD [address] [place] [address]   DIAGNOSIS: Thrombocytopenia  SUMMARY OF HEMATOLOGIC HISTORY:  Underwent thrombocytopenia lab evaluation inn November 2021 without no evidence of hepatitis, hypothyroidism, nutritional deficiencies.  MR of the abdomen from May 2021 shows no evidence of liver disease or splenomegaly.  Bone marrow biopsy from 03/19/2020 showed hypercellularity, no evidence of dyspoeisis, megakaryocytic hyperplasia. Flow cytometry negative for any monoclonal B cell population. Cytogenetics were normal.  Bone marrow biopsy 02/13/2023, overall normocellular marrow with orderly trilineage hematopoiesis and megakaryocytic hyperplasia. 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 Cytogenetics showed normal female karyotype.  Steroids 02/22/2023 which seemed to improve the platelet count but led to a slump in energy levels after discontinuation-minimal improvement in platelet count IVIG 03/19/2023 and 03/22/2023  CURRENT THERAPY: Observation  INTERVAL HISTORY: Discussed the use of AI scribe software for clinical note transcription with the patient, who gave verbal consent to proceed.  History of Present Illness    Jillian Rodgers is a 78 year old female with atrial fibrillation and thrombocytopenia who presents with low platelet counts and recent hospitalization for pneumonia. She traveled to [place] with her husband.  She has a history of atrial fibrillation and thrombocytopenia, currently experiencing low platelet counts, reported at thirty-nine thousand. She has not yet undergone the Watchman procedure, which was scheduled during her time in [place] and has not been rescheduled. She is currently on Eliquis. She experiences epistaxis when blowing her nose. No other bleeding symptoms such as blood in stool or urine, and  no intractable headaches.  Recently, she was hospitalized in [place] after experiencing symptoms of dehydration, pneumonia, and low blood pressure. She was at a baseball game when she felt lightheaded and almost passed out, prompting EMTs to recommend an ER visit. At the ER, she received IV fluids which stabilized her blood pressure, and she was prescribed two antibiotics for pneumonia, which she completed over seven days.  She reports experiencing diarrhea intermittently, which she attributes to the antibiotics.  Rest of the pertinent 10 point ROS reviewed and neg.  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  MEDICAL HISTORY: Past Medical History:  Diagnosis Date   Anemia with low platelet count (HCC)    Chronic kidney disease    Degenerative joint disease    Dysrhythmia    a fib   GERD (gastroesophageal reflux disease)    History of kidney  stones    Hyperlipidemia     Hypothyroidism    Inguinal hernia 02/19/2019   Labile hypertension    Migraines    neurontin helps   Pneumonia    hx of walking pneumonia    Primary hypertension 04/28/2022    SURGICAL HISTORY: Past Surgical History:  Procedure Laterality Date   BILATERAL CATARACT SURGERY      BLERPHOROPLASTY \     CHOLECYSTECTOMY  1996   open   COLONOSCOPY  2009   recommended 10 year f/u   HERNIA REPAIR  1985   umb hernia rpr   HERNIA REPAIR  2005 & 2009   ventral hernia rpr   INGUINAL HERNIA REPAIR Left 04/30/2020   Procedure: OPEN LEFT INGUINAL HERNIA REPAIR WITH MESH;  Surgeon: Berna Bue, MD;  Location: [facility];  Service: General;  Laterality: Left;   INGUINAL HERNIA REPAIR Right 10/24/2021   Procedure: OPEN RIGHT INGUINAL HERNIA REPAIR WITH MESH ;  Surgeon: Berna Bue, MD;  Location: [facility];  Service: General;  Laterality: Right;   PILONIDAL CYST EXCISION  1972   SKIN CANCER REMOVAL      TONSILLECTOMY     UPPER GI ENDOSCOPY     VENTRAL HERNIA REPAIR      SOCIAL HISTORY: Social History   Socioeconomic History   Marital status: Married    Spouse name: Not on file   Number of children: 2   Years of education: 18   Highest education level: Not on file  Occupational History   Not on file  Tobacco Use   Smoking status: Never   Smokeless tobacco: Never  Vaping Use   Vaping status: Never Used  Substance and Sexual Activity   Alcohol use: No   Drug use: No   Sexual activity: Yes  Other Topics Concern   Not on file  Social History Narrative   Live with husband    Retired    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  Food Insecurity: No Food Insecurity (03/25/2023)   Hunger Vital Sign    Worried About Running Out of Food in the Last Year: Never true    Ran Out of Food in the Last Year: Never true  Transportation Needs: No Transportation Needs (03/25/2023)   PRAPARE - Administrator, Civil Service (Medical): No    Lack of  Transportation (Non-Medical): No  Physical Activity: Not on file  Stress: Not on file  Social Connections: Not on file  Intimate Partner Violence: Not At Risk (03/25/2023)   Humiliation, Afraid, Rape, and Kick questionnaire    Fear of Current or Ex-Partner: No    Emotionally Abused: No    Physically Abused: No    Sexually Abused: No    FAMILY HISTORY: Family History  Problem Relation Age of Onset   Ovarian cancer Mother    Heart disease Mother    Cancer Mother    Uterine cancer Mother    Hypertension Mother    Breast cancer Sister    Stroke Paternal Uncle    Congestive Heart Failure Father    Gout Maternal Aunt    Colon cancer Neg Hx    Esophageal cancer Neg Hx    Stomach cancer Neg Hx    Rectal cancer Neg Hx     Review of Systems  Constitutional:  Negative for appetite change, chills, fatigue, fever and unexpected weight change.  HENT:   Negative for hearing loss, lump/mass and trouble  swallowing.   Eyes:  Negative for eye problems and icterus.  Respiratory:  Negative for chest tightness, cough and shortness of breath.   Cardiovascular:  Negative for chest pain, leg swelling and palpitations.  Gastrointestinal:  Negative for abdominal distention, abdominal pain, constipation, diarrhea, nausea and vomiting.  Endocrine: Negative for hot flashes.  Genitourinary:  Negative for difficulty urinating.   Musculoskeletal:  Negative for arthralgias.  Skin:  Negative for itching and rash.  Neurological:  Negative for dizziness, extremity weakness, headaches and numbness.  Hematological:  Negative for adenopathy. Does not bruise/bleed easily.  Psychiatric/Behavioral:  Negative for depression. The patient is not nervous/anxious.   All other systems reviewed and are negative.     PHYSICAL EXAMINATION    Vitals:   06/18/23 1143  BP: (!) 147/79  Pulse: 62  Resp: 17  Temp: 98.2 F (36.8 C)  SpO2: 94%   Alert, oriented no acute distress No cervical or axillary  adneopathy Chest: CTA bilaterally Heart: RRR No LE edema.  LABORATORY DATA:  CBC    Component Value Date/Time   WBC 7.0 06/18/2023 1128   RBC 3.66 (L) 06/18/2023 1128   HGB 11.4 (L) 06/18/2023 1128   HGB 11.3 04/20/2023 1445   HCT 35.3 (L) 06/18/2023 1128   HCT 34.8 04/20/2023 1445   PLT 39 (L) 06/18/2023 1128   PLT 81 (LL) 04/20/2023 1445   MCV 96.4 06/18/2023 1128   MCV 97 04/20/2023 1445   MCH 31.1 06/18/2023 1128   MCHC 32.3 06/18/2023 1128   RDW 12.7 06/18/2023 1128   RDW 13.8 04/20/2023 1445   LYMPHSABS 2.0 06/18/2023 1128   MONOABS 1.0 06/18/2023 1128   EOSABS 0.1 06/18/2023 1128   BASOSABS 0.1 06/18/2023 1128    CMP     Component Value Date/Time   NA 140 04/20/2023 1445   K 4.8 04/20/2023 1445   CL 100 04/20/2023 1445   CO2 23 04/20/2023 1445   GLUCOSE 99 04/20/2023 1445   GLUCOSE 97 04/18/2023 1910   BUN 23 04/20/2023 1445   CREATININE 1.45 (H) 04/20/2023 1445   CREATININE 1.24 (H) 03/09/2023 1204   CREATININE 1.27 (H) 01/18/2023 1219   CALCIUM 9.8 04/20/2023 1445   PROT 7.2 04/18/2023 1910   ALBUMIN 3.3 (L) 04/18/2023 1910   AST 25 04/18/2023 1910   AST 16 03/09/2023 1204   ALT 28 04/18/2023 1910   ALT 13 03/09/2023 1204   ALKPHOS 55 04/18/2023 1910   BILITOT 0.5 04/18/2023 1910   BILITOT 0.6 03/09/2023 1204   GFRNONAA 53 (L) 04/18/2023 1910   GFRNONAA 44 (L) 03/09/2023 1204   GFRNONAA 45 (L) 10/20/2020 1542   GFRAA 52 (L) 10/20/2020 1542      ASSESSMENT and THERAPY PLAN:   Thrombocytopenia Platelet count decreased to 39,000. Recent history of pneumonia and dehydration. Patient is currently on Eliquis for atrial fibrillation, which poses a risk for bleeding with low platelet count. Patient reports nosebleeds. -Plan to administer two doses of IVIG to increase platelet count. She responded well to this in the past. -Consider Rituximab infusions if thrombocytopenia persists after IVIG. -Communicate with cardiologist Dr. Bjorn Pippin regarding the  risk of continuing Eliquis with current platelet count. -Advise patient to monitor for signs of bleeding.  Atrial Fibrillation Patient is on Eliquis, but recent drop in platelet count poses a risk for bleeding. Patient has not yet had the Watchman procedure due to scheduling conflicts. -Reschedule Watchman procedure once platelet count is stabilized.  Pneumonia Recent episode of pneumonia treated with  a 7-day course of antibiotics. Patient reports intermittent diarrhea post-treatment. -Advise patient to take probiotics or consume yogurt to reconstitute gut flora post-antibiotic treatment.  Follow-up in 1 week to monitor platelet count and response to IVIG.  *Total Encounter Time as defined by the Centers for Medicare and Medicaid Services includes, in addition to the face-to-face time of a patient visit (documented in the note above) non-face-to-face time: obtaining and reviewing outside history, ordering and reviewing medications, tests or procedures, care coordination (communications with other health care professionals or caregivers) and documentation in the medical record.

## 2023-06-19 ENCOUNTER — Inpatient Hospital Stay

## 2023-06-19 ENCOUNTER — Telehealth: Payer: Self-pay

## 2023-06-19 ENCOUNTER — Telehealth: Payer: Self-pay | Admitting: *Deleted

## 2023-06-19 ENCOUNTER — Other Ambulatory Visit: Payer: Self-pay | Admitting: *Deleted

## 2023-06-19 VITALS — BP 128/73 | HR 50 | Temp 97.3°F | Resp 16

## 2023-06-19 DIAGNOSIS — D693 Immune thrombocytopenic purpura: Secondary | ICD-10-CM | POA: Diagnosis not present

## 2023-06-19 DIAGNOSIS — D509 Iron deficiency anemia, unspecified: Secondary | ICD-10-CM

## 2023-06-19 DIAGNOSIS — I4819 Other persistent atrial fibrillation: Secondary | ICD-10-CM

## 2023-06-19 DIAGNOSIS — D696 Thrombocytopenia, unspecified: Secondary | ICD-10-CM

## 2023-06-19 DIAGNOSIS — I10 Essential (primary) hypertension: Secondary | ICD-10-CM

## 2023-06-19 MED ORDER — DIPHENHYDRAMINE HCL 25 MG PO CAPS
25.0000 mg | ORAL_CAPSULE | Freq: Once | ORAL | Status: AC
  Administered 2023-06-19: 25 mg via ORAL
  Filled 2023-06-19: qty 1

## 2023-06-19 MED ORDER — CLONIDINE HCL 0.1 MG PO TABS
0.1000 mg | ORAL_TABLET | Freq: Once | ORAL | Status: AC
  Administered 2023-06-19: 0.1 mg via ORAL
  Filled 2023-06-19: qty 1

## 2023-06-19 MED ORDER — DEXTROSE 5 % IV SOLN
INTRAVENOUS | Status: DC
Start: 2023-06-19 — End: 2023-06-19

## 2023-06-19 MED ORDER — ACETAMINOPHEN 325 MG PO TABS
650.0000 mg | ORAL_TABLET | Freq: Once | ORAL | Status: AC
  Administered 2023-06-19: 650 mg via ORAL
  Filled 2023-06-19: qty 2

## 2023-06-19 MED ORDER — CLONIDINE HCL 0.1 MG PO TABS: 0.1000 mg | ORAL_TABLET | Freq: Once | ORAL | Status: DC

## 2023-06-19 MED ORDER — IMMUNE GLOBULIN (HUMAN) 10 GM/100ML IJ SOLN
70.0000 g | Freq: Once | INTRAMUSCULAR | Status: AC
Start: 2023-06-19 — End: 2023-06-19
  Administered 2023-06-19: 70 g via INTRAVENOUS
  Filled 2023-06-19: qty 700

## 2023-06-19 NOTE — Telephone Encounter (Signed)
 Telephone call to patient informing her that her IVIG appointment tomorrow 06/20/2023 would have to be rescheduled from 0830 to 1000 due to medication delivery delay. Patient was agreeable with new appointment time.

## 2023-06-19 NOTE — Patient Instructions (Addendum)
 [facility]  Discharge Instructions: Thank you for choosing [facility] to provide your oncology and hematology care.   If you have a lab appointment with the Cancer Center, please go directly to the Cancer Center and check in at the registration area.     We strive to give you quality time with your provider. You may need to reschedule your appointment if you arrive late (15 or more minutes).  Arriving late affects you and other patients whose appointments are after yours.  Also, if you miss three or more appointments without notifying the office, you may be dismissed from the clinic at the provider's discretion.      For prescription refill requests, have your pharmacy contact our office and allow 72 hours for refills to be completed.    Today you received the following infusion, IVIG   To help prevent nausea and vomiting after your treatment, we encourage you to take your nausea medication as directed.  BELOW ARE SYMPTOMS THAT SHOULD BE REPORTED IMMEDIATELY: *FEVER GREATER THAN 100.4 F (38 C) OR HIGHER *CHILLS OR SWEATING *NAUSEA AND VOMITING THAT IS NOT CONTROLLED WITH YOUR NAUSEA MEDICATION *UNUSUAL SHORTNESS OF BREATH *UNUSUAL BRUISING OR BLEEDING *URINARY PROBLEMS (pain or burning when urinating, or frequent urination) *BOWEL PROBLEMS (unusual diarrhea, constipation, pain near the anus) TENDERNESS IN MOUTH AND THROAT WITH OR WITHOUT PRESENCE OF ULCERS (sore throat, sores in mouth, or a toothache) UNUSUAL RASH, SWELLING OR PAIN  UNUSUAL VAGINAL DISCHARGE OR ITCHING   Items with * indicate a potential emergency and should be followed up as soon as possible or go to the Emergency Department if any problems should occur.   Should you have questions after your visit or need to cancel or reschedule your appointment, please contact [facility]  Dept:  [phone]  and follow the prompts.  Office hours are 8:00 a.m. to 4:30 p.m. Monday - Friday. Please note that voicemails left after 4:00 p.m. may not be returned until the following business day.  We are closed weekends and major holidays. You have access to a nurse at all times for urgent questions. Please call the main number to the clinic Dept: [phone] and follow the prompts.   For any non-urgent questions, you may also contact your provider using MyChart. We now offer e-Visits for anyone 42 and older to request care online for non-urgent symptoms. For details visit [email or web address].   Also download the MyChart app! Go to the app store, search "MyChart", open the app, select , and log in with your MyChart username and password.

## 2023-06-19 NOTE — Progress Notes (Signed)
 1030-blood pressure keeps rising during administration of ivig. Dr.Iruku notified,pt is not on any blood pressure meds,pt is asymptomatic. Per MD will continue with IVIG and monitor per protocol.   1110-blood pressure 179/100 manually, notified md  1150-will  give clonidine as ordered, will wait for diastyolic to be below 90 before restarting ivig per md  1201 Clonidine 0.1 given per MD  1230 blood pressure 148/87 will restart IVIG at rate of 120 mg  IVIG infusion completed today.Patient tolerated it well. Vitals stable and discharged home from clinic ambulatory. Follow up as scheduled.

## 2023-06-19 NOTE — Telephone Encounter (Signed)
 This RN attempted to call pt per MD request to for her to hold Eliquis at this time post communication with the cardiologist.  Detailed message left per above including need to hold at this time until she received the IVIG and platelets are higher.  This RN did request a return call to verify request.

## 2023-06-20 ENCOUNTER — Inpatient Hospital Stay

## 2023-06-20 ENCOUNTER — Ambulatory Visit

## 2023-06-20 VITALS — BP 174/93 | HR 54 | Temp 98.1°F | Resp 16 | Wt 162.0 lb

## 2023-06-20 DIAGNOSIS — D693 Immune thrombocytopenic purpura: Secondary | ICD-10-CM | POA: Diagnosis not present

## 2023-06-20 DIAGNOSIS — D509 Iron deficiency anemia, unspecified: Secondary | ICD-10-CM

## 2023-06-20 MED ORDER — CLONIDINE HCL 0.1 MG PO TABS
0.1000 mg | ORAL_TABLET | Freq: Once | ORAL | Status: AC
  Administered 2023-06-20: 0.1 mg via ORAL
  Filled 2023-06-20: qty 1

## 2023-06-20 MED ORDER — IMMUNE GLOBULIN (HUMAN) 10 GM/100ML IJ SOLN
1.0000 g/kg | Freq: Once | INTRAMUSCULAR | Status: AC
  Administered 2023-06-20: 70 g via INTRAVENOUS
  Filled 2023-06-20: qty 700

## 2023-06-20 MED ORDER — ACETAMINOPHEN 325 MG PO TABS
650.0000 mg | ORAL_TABLET | Freq: Once | ORAL | Status: AC
  Administered 2023-06-20: 650 mg via ORAL
  Filled 2023-06-20: qty 2

## 2023-06-20 MED ORDER — DIPHENHYDRAMINE HCL 25 MG PO CAPS
25.0000 mg | ORAL_CAPSULE | Freq: Once | ORAL | Status: AC
  Administered 2023-06-20: 25 mg via ORAL
  Filled 2023-06-20: qty 1

## 2023-06-20 MED ORDER — METHYLPREDNISOLONE SODIUM SUCC 40 MG IJ SOLR
40.0000 mg | Freq: Once | INTRAMUSCULAR | Status: AC
  Administered 2023-06-20: 40 mg via INTRAVENOUS
  Filled 2023-06-20: qty 1

## 2023-06-20 MED ORDER — DEXTROSE 5 % IV SOLN: INTRAVENOUS | Status: DC

## 2023-06-20 NOTE — Progress Notes (Signed)
 Patient presents today for IVIG infusion.  Patient is in satisfactory condition with no new complaints voiced.  Vital signs are stable.  We will proceed with infusion per provider orders.   1156- Patient's blood pressure increasing from 147/87 to 171/88 (second recheck after IVIG paused at 120mg /kg/hr-88.43mL/hr, first 160/101). Patient is currently asymptomatic. Patient had similar reaction yesterday during treatment and was given clonidine 0.1mg , able to continue treatment yesterday after with no issues. Dr Burnice Logan Iruku made aware of patients blood pressure increase-order given for clonidine 0.1mg  and is to be added to treatment plan. Recheck of patient's blood pressure prior to clonidine 156/98-will resume patients IVIG treatment when diastolic is less than 90.  1259- Patient's blood pressure 132/82, IVIG restarted at 60mg /kg/hr-44.1mL   1608 Patient tolerated IVIG with highest blood pressure being 158/92 during treatment in which she she remained asymptomatic. We did infuse her at 480mg /kg/hr which was 352.8-per order. Her blood pressure upon completion is 177/95, she is still asymptomatic. Dr Burnice Logan Iruku made aware. Per Dr Al Pimple patient may be discharge if asymptomatic. Patient made aware of signs and symptoms to watch for and go to ED upon dizziness, slurred speech, facial drooping, weakness, and /or severe headache. Patient verbalized understanding.   Per Dr Burnice Logan Iruku if patient requires future IVIG infusions rate to be stopped at 240mg /kg/hr.

## 2023-06-20 NOTE — Patient Instructions (Signed)
 [facility]  Discharge Instructions: Thank you for choosing [facility] to provide your oncology and hematology care.   If you have a lab appointment with the Cancer Center, please go directly to the Cancer Center and check in at the registration area.   Wear comfortable clothing and clothing appropriate for easy access to any Portacath or PICC line.   We strive to give you quality time with your provider. You may need to reschedule your appointment if you arrive late (15 or more minutes).  Arriving late affects you and other patients whose appointments are after yours.  Also, if you miss three or more appointments without notifying the office, you may be dismissed from the clinic at the provider's discretion.      For prescription refill requests, have your pharmacy contact our office and allow 72 hours for refills to be completed.    Today you received the following chemotherapy and/or immunotherapy agents IVIG, Immune Globulin-Gamunex-c.  Immune Globulin Injection What is this medication? IMMUNE GLOBULIN (im MUNE GLOB yoo lin) treats many immune system conditions. It works by Designer, multimedia extra antibodies. Antibodies are proteins made by the immune system that help protect the body. This medicine may be used for other purposes; ask your health care provider or pharmacist if you have questions. COMMON BRAND NAME(S): ASCENIV, Baygam, BIVIGAM, Carimune, Carimune NF, cutaquig, Cuvitru, Flebogamma, Flebogamma DIF, GamaSTAN, GamaSTAN S/D, Gamimune N, Gammagard, Gammagard S/D, Gammaked, Gammaplex, Gammar-P IV, Gamunex, Gamunex-C, Hizentra, Iveegam, Iveegam EN, Octagam, Panglobulin, Panglobulin NF, panzyga, Polygam S/D, Privigen, Sandoglobulin, Venoglobulin-S, Vigam, Vivaglobulin, Xembify What should I tell my care team before I take this medication? They need to know if you have any of these conditions: Blood clotting  disorder Condition where you have excess fluid in your body, such as heart failure or edema Dehydration Diabetes Have had blood clots Heart disease Immune system conditions Kidney disease Low levels of IgA Recent or upcoming vaccine An unusual or allergic reaction to immune globulin, other medications, foods, dyes, or preservatives Pregnant or trying to get pregnant Breastfeeding How should I use this medication? This medication is infused into a vein or under the skin. It is usually given by your care team in a hospital or clinic setting. It may also be given at home. If you get this medication at home, you will be taught how to prepare and give it. Use exactly as directed. Take it as directed on the prescription label at the same time every day. Keep taking it unless your care team tells you to stop. It is important that you put your used needles and syringes in a special sharps container. Do not put them in a trash can. If you do not have a sharps container, call your pharmacist or care team to get one. Talk to your care team about the use of this medication in children. While it may be given to children for selected conditions, precautions do apply. Overdosage: If you think you have taken too much of this medicine contact a poison control center or emergency room at once. NOTE: This medicine is only for you. Do not share this medicine with others. What if I miss a dose? If you get this medication at the hospital or clinic: It is important not to miss your dose. Call your care team if you are unable to keep an appointment. If you give yourself this medication at home: If you miss  a dose, take it as soon as you can. Then continue your normal schedule. If it is almost time for your next dose, take only that dose. Do not take double or extra doses. Call your care team with questions. What may interact with this medication? Live virus vaccines This list may not describe all possible  interactions. Give your health care provider a list of all the medicines, herbs, non-prescription drugs, or dietary supplements you use. Also tell them if you smoke, drink alcohol, or use illegal drugs. Some items may interact with your medicine. What should I watch for while using this medication? Your condition will be monitored carefully while you are receiving this medication. Tell your care team if your symptoms do not start to get better or if they get worse. You may need blood work done while you are taking this medication. This medication increases the risk of blood clots. People with heart, blood vessel, or blood clotting conditions are more likely to develop a blood clot. Other risk factors include advanced age, estrogen use, tobacco use, lack of movement, and being overweight. This medication can decrease the response to a vaccine. If you need to get vaccinated, tell your care team if you have received this medication within the last year. Extra booster doses may be needed. Talk to your care team to see if a different vaccination schedule is needed. If you have diabetes, you may get a falsely elevated blood sugar reading. Talk to your care team about how to check your blood sugar while taking this medication. What side effects may I notice from receiving this medication? Side effects that you should report to your care team as soon as possible: Allergic reactions--skin rash, itching, hives, swelling of the face, lips, tongue, or throat Blood clot--pain, swelling, or warmth in the leg, shortness of breath, chest pain Fever, neck pain or stiffness, sensitivity to light, headache, nausea, vomiting, confusion, which may be signs of meningitis Hemolytic anemia--unusual weakness or fatigue, dizziness, headache, trouble breathing, dark urine, yellowing skin or eyes Kidney injury--decrease in the amount of urine, swelling of the ankles, hands, or feet Low sodium level--muscle weakness, fatigue,  dizziness, headache, confusion Shortness of breath or trouble breathing, cough, unusual weakness or fatigue, blue skin or lips Side effects that usually do not require medical attention (report these to your care team if they continue or are bothersome): Chills Diarrhea Fever Headache Nausea This list may not describe all possible side effects. Call your doctor for medical advice about side effects. You may report side effects to FDA at 1-800-FDA-1088. Where should I keep my medication? Keep out of the reach of children and pets. You will be instructed on how to store this medication. Get rid of any unused medication after the expiration date. To get rid of medications that are no longer needed or have expired: Take the medication to a medication take-back program. Check with your pharmacy or law enforcement to find a location. If you cannot return the medication, ask your pharmacist or care team how to get rid of this medication safely. NOTE: This sheet is a summary. It may not cover all possible information. If you have questions about this medicine, talk to your doctor, pharmacist, or health care provider.  2024 Elsevier/Gold Standard (2023-03-16 00:00:00)    To help prevent nausea and vomiting after your treatment, we encourage you to take your nausea medication as directed.  BELOW ARE SYMPTOMS THAT SHOULD BE REPORTED IMMEDIATELY: *FEVER GREATER THAN 100.4 F (38 C)  OR HIGHER *CHILLS OR SWEATING *NAUSEA AND VOMITING THAT IS NOT CONTROLLED WITH YOUR NAUSEA MEDICATION *UNUSUAL SHORTNESS OF BREATH *UNUSUAL BRUISING OR BLEEDING *URINARY PROBLEMS (pain or burning when urinating, or frequent urination) *BOWEL PROBLEMS (unusual diarrhea, constipation, pain near the anus) TENDERNESS IN MOUTH AND THROAT WITH OR WITHOUT PRESENCE OF ULCERS (sore throat, sores in mouth, or a toothache) UNUSUAL RASH, SWELLING OR PAIN  UNUSUAL VAGINAL DISCHARGE OR ITCHING   Items with * indicate a potential  emergency and should be followed up as soon as possible or go to the Emergency Department if any problems should occur.  Please show the CHEMOTHERAPY ALERT CARD or IMMUNOTHERAPY ALERT CARD at check-in to the Emergency Department and triage nurse.  Should you have questions after your visit or need to cancel or reschedule your appointment, please contact [facility]  Dept: [phone]  and follow the prompts.  Office hours are 8:00 a.m. to 4:30 p.m. Monday - Friday. Please note that voicemails left after 4:00 p.m. may not be returned until the following business day.  We are closed weekends and major holidays. You have access to a nurse at all times for urgent questions. Please call the main number to the clinic Dept: [phone] and follow the prompts.   For any non-urgent questions, you may also contact your provider using MyChart. We now offer e-Visits for anyone 32 and older to request care online for non-urgent symptoms. For details visit [email or web address].   Also download the MyChart app! Go to the app store, search "MyChart", open the app, select [place], and log in with your MyChart username and password.

## 2023-06-21 ENCOUNTER — Encounter (HOSPITAL_COMMUNITY): Payer: Self-pay

## 2023-06-21 ENCOUNTER — Telehealth: Payer: Self-pay | Admitting: Cardiology

## 2023-06-21 ENCOUNTER — Inpatient Hospital Stay (HOSPITAL_COMMUNITY): Admit: 2023-06-21 | Payer: Medicare PPO | Admitting: Cardiology

## 2023-06-21 SURGERY — ATRIAL FIBRILLATION ABLATION
Anesthesia: General

## 2023-06-21 MED ORDER — DILTIAZEM HCL ER COATED BEADS 180 MG PO CP24: 180.0000 mg | ORAL_CAPSULE | Freq: Every day | ORAL | 3 refills | Status: AC

## 2023-06-21 NOTE — Telephone Encounter (Signed)
 Pt's medication was sent to pt's pharmacy as requested. Confirmation received.

## 2023-06-21 NOTE — Telephone Encounter (Signed)
*  STAT* If patient is at the pharmacy, call can be transferred to refill team.   1. Which medications need to be refilled? (please list name of each medication and dose if known) diltiazem (CARDIZEM CD) 180 MG 24 hr capsule   2. Which pharmacy/location (including street and city if local pharmacy) is medication to be sent to? diltiazem (CARDIZEM CD) 180 MG 24 hr capsule  3. Do they need a 30 day or 90 day supply? 90  Pt is out of medication

## 2023-06-28 ENCOUNTER — Inpatient Hospital Stay

## 2023-06-28 ENCOUNTER — Inpatient Hospital Stay: Admitting: Hematology and Oncology

## 2023-06-28 VITALS — BP 107/77 | HR 72 | Temp 97.7°F | Resp 16 | Wt 158.2 lb

## 2023-06-28 DIAGNOSIS — I4819 Other persistent atrial fibrillation: Secondary | ICD-10-CM | POA: Diagnosis not present

## 2023-06-28 DIAGNOSIS — D693 Immune thrombocytopenic purpura: Secondary | ICD-10-CM

## 2023-06-28 DIAGNOSIS — I10 Essential (primary) hypertension: Secondary | ICD-10-CM

## 2023-06-28 DIAGNOSIS — D509 Iron deficiency anemia, unspecified: Secondary | ICD-10-CM | POA: Diagnosis not present

## 2023-06-28 DIAGNOSIS — D696 Thrombocytopenia, unspecified: Secondary | ICD-10-CM

## 2023-06-28 LAB — CBC WITH DIFFERENTIAL/PLATELET
Abs Immature Granulocytes: 0.02 10*3/uL (ref 0.00–0.07)
Basophils Absolute: 0 10*3/uL (ref 0.0–0.1)
Basophils Relative: 1 %
Eosinophils Absolute: 0.1 10*3/uL (ref 0.0–0.5)
Eosinophils Relative: 1 %
HCT: 31 % — ABNORMAL LOW (ref 36.0–46.0)
Hemoglobin: 10.6 g/dL — ABNORMAL LOW (ref 12.0–15.0)
Immature Granulocytes: 0 %
Lymphocytes Relative: 29 %
Lymphs Abs: 1.4 10*3/uL (ref 0.7–4.0)
MCH: 30.9 pg (ref 26.0–34.0)
MCHC: 34.2 g/dL (ref 30.0–36.0)
MCV: 90.4 fL (ref 80.0–100.0)
Monocytes Absolute: 0.7 10*3/uL (ref 0.1–1.0)
Monocytes Relative: 15 %
Neutro Abs: 2.6 10*3/uL (ref 1.7–7.7)
Neutrophils Relative %: 54 %
Platelets: 144 10*3/uL — ABNORMAL LOW (ref 150–400)
RBC: 3.43 MIL/uL — ABNORMAL LOW (ref 3.87–5.11)
RDW: 13.8 % (ref 11.5–15.5)
WBC: 4.9 10*3/uL (ref 4.0–10.5)
nRBC: 0 % (ref 0.0–0.2)

## 2023-06-28 NOTE — Progress Notes (Signed)
 [facility] Cancer Follow up:    Jillian Cowboy, MD [address] [place] [address]   DIAGNOSIS: Thrombocytopenia  SUMMARY OF HEMATOLOGIC HISTORY:  Underwent thrombocytopenia lab evaluation inn November 2021 without no evidence of hepatitis, hypothyroidism, nutritional deficiencies.  MR of the abdomen from May 2021 shows no evidence of liver disease or splenomegaly.  Bone marrow biopsy from 03/19/2020 showed hypercellularity, no evidence of dyspoeisis, megakaryocytic hyperplasia. Flow cytometry negative for any monoclonal B cell population. Cytogenetics were normal.  Bone marrow biopsy 02/13/2023, overall normocellular marrow with orderly trilineage hematopoiesis and megakaryocytic hyperplasia. 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 Cytogenetics showed normal female karyotype.  Steroids 02/22/2023 which seemed to improve the platelet count but led to a slump in energy levels after discontinuation-minimal improvement in platelet count IVIG 03/19/2023 and 03/22/2023  CURRENT THERAPY: Observation  INTERVAL HISTORY: Discussed the use of AI scribe software for clinical note transcription with the patient, who gave verbal consent to proceed.  History of Present Illness    The patient, with immune thrombocytopenia, presents for follow-up on her platelet count and treatment options.  She has been experiencing fluctuations in her platelet count, with a recent count of 144,000 following an IVIG treatment. This effect typically lasts about six to eight weeks, after which her platelet count tends to drop, as has happened previously.  She has experienced minor bleeding episodes, including bleeding from footwork due to nail clipping and a minor epistaxis when blowing her nose. These episodes were not severe or spontaneous. She is currently on Eliquis, which was held temporarily during a  recent infusion.  During infusions, she has experienced high blood pressure, which required intervention with clonidine. This was noted during a recent infusion where the staff was concerned about her elevated blood pressure.  Her medical history includes immune thrombocytopenia, characterized by an overactive immune system attacking her platelets. She has undergone various tests, including a bone marrow biopsy, which ruled out leukemia. Her condition is described as chronic, with potential flare-ups and remissions.  Family history is significant for leukemia, as her brother died of the disease after a prolonged course. This history has been a concern for her, but her condition has been differentiated from leukemia.  Rest of the pertinent 10 point ROS reviewed and neg.  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  MEDICAL HISTORY: Past Medical History:  Diagnosis Date   Anemia  with low platelet count (HCC)  Chronic kidney disease    Degenerative joint disease    Dysrhythmia    a fib   GERD (gastroesophageal reflux disease)    History of kidney stones    Hyperlipidemia    Hypothyroidism    Inguinal hernia 02/19/2019   Labile hypertension    Migraines    neurontin helps   Pneumonia    hx of walking pneumonia    Primary hypertension 04/28/2022    SURGICAL HISTORY: Past Surgical History:  Procedure Laterality Date   BILATERAL CATARACT SURGERY      BLERPHOROPLASTY \     CHOLECYSTECTOMY  1996   open   COLONOSCOPY  2009   recommended 10 year f/u   HERNIA REPAIR  1985   umb hernia rpr   HERNIA REPAIR  2005 & 2009   ventral hernia rpr   INGUINAL HERNIA REPAIR Left 04/30/2020   Procedure: OPEN LEFT INGUINAL HERNIA REPAIR WITH MESH;  Surgeon: Berna Bue, MD;  Location: [facility];  Service: General;  Laterality: Left;   INGUINAL HERNIA REPAIR Right 10/24/2021   Procedure: OPEN RIGHT INGUINAL HERNIA REPAIR WITH MESH ;  Surgeon: Berna Bue, MD;  Location: [facility];  Service: General;  Laterality: Right;   PILONIDAL CYST EXCISION  1972   SKIN CANCER REMOVAL      TONSILLECTOMY     UPPER GI ENDOSCOPY     VENTRAL HERNIA REPAIR      SOCIAL HISTORY: Social History   Socioeconomic History   Marital status: Married    Spouse name: Not on file   Number of children: 2   Years of education: 18   Highest education level: Not on file  Occupational History   Not on file  Tobacco Use   Smoking status: Never   Smokeless tobacco: Never  Vaping Use   Vaping status: Never Used  Substance and Sexual Activity   Alcohol use: No   Drug use: No   Sexual activity: Yes  Other Topics Concern   Not on file  Social History Narrative   Live with husband    Retired    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  Food Insecurity: No Food Insecurity (03/25/2023)   Hunger Vital Sign    Worried About Running Out of Food in the Last  Year: Never true    Ran Out of Food in the Last Year: Never true  Transportation Needs: No Transportation Needs (03/25/2023)   PRAPARE - Administrator, Civil Service (Medical): No    Lack of Transportation (Non-Medical): No  Physical Activity: Not on file  Stress: Not on file  Social Connections: Not on file  Intimate Partner Violence: Not At Risk (03/25/2023)   Humiliation, Afraid, Rape, and Kick questionnaire    Fear of Current or Ex-Partner: No    Emotionally Abused: No    Physically Abused: No    Sexually Abused: No    FAMILY HISTORY: Family History  Problem Relation Age of Onset   Ovarian cancer Mother    Heart disease Mother    Cancer Mother    Uterine cancer Mother    Hypertension Mother    Breast cancer Sister    Stroke Paternal Uncle    Congestive Heart Failure Father    Gout Maternal Aunt    Colon cancer Neg Hx    Esophageal cancer Neg Hx    Stomach cancer Neg Hx    Rectal cancer Neg Hx  Review of Systems  Constitutional:  Negative for appetite change, chills, fatigue, fever and unexpected weight change.  HENT:   Negative for hearing loss, lump/mass and trouble swallowing.   Eyes:  Negative for eye problems and icterus.  Respiratory:  Negative for chest tightness, cough and shortness of breath.   Cardiovascular:  Negative for chest pain, leg swelling and palpitations.  Gastrointestinal:  Negative for abdominal distention, abdominal pain, constipation, diarrhea, nausea and vomiting.  Endocrine: Negative for hot flashes.  Genitourinary:  Negative for difficulty urinating.   Musculoskeletal:  Negative for arthralgias.  Skin:  Negative for itching and rash.  Neurological:  Negative for dizziness, extremity weakness, headaches and numbness.  Hematological:  Negative for adenopathy. Does not bruise/bleed easily.  Psychiatric/Behavioral:  Negative for depression. The patient is not nervous/anxious.   All other systems reviewed and are negative.      PHYSICAL EXAMINATION    Vitals:   06/28/23 1157  BP: 107/77  Pulse: 72  Resp: 16  Temp: 97.7 F (36.5 C)  SpO2: 99%   Alert, oriented no acute distress  LABORATORY DATA:  CBC    Component Value Date/Time   WBC 4.9 06/28/2023 1138   RBC 3.43 (L) 06/28/2023 1138   HGB 10.6 (L) 06/28/2023 1138   HGB 11.3 04/20/2023 1445   HCT 31.0 (L) 06/28/2023 1138   HCT 34.8 04/20/2023 1445   PLT 144 (L) 06/28/2023 1138   PLT 81 (LL) 04/20/2023 1445   MCV 90.4 06/28/2023 1138   MCV 97 04/20/2023 1445   MCH 30.9 06/28/2023 1138   MCHC 34.2 06/28/2023 1138   RDW 13.8 06/28/2023 1138   RDW 13.8 04/20/2023 1445   LYMPHSABS 1.4 06/28/2023 1138   MONOABS 0.7 06/28/2023 1138   EOSABS 0.1 06/28/2023 1138   BASOSABS 0.0 06/28/2023 1138    CMP     Component Value Date/Time   NA 140 04/20/2023 1445   K 4.8 04/20/2023 1445   CL 100 04/20/2023 1445   CO2 23 04/20/2023 1445   GLUCOSE 99 04/20/2023 1445   GLUCOSE 97 04/18/2023 1910   BUN 23 04/20/2023 1445   CREATININE 1.45 (H) 04/20/2023 1445   CREATININE 1.24 (H) 03/09/2023 1204   CREATININE 1.27 (H) 01/18/2023 1219   CALCIUM 9.8 04/20/2023 1445   PROT 7.2 04/18/2023 1910   ALBUMIN 3.3 (L) 04/18/2023 1910   AST 25 04/18/2023 1910   AST 16 03/09/2023 1204   ALT 28 04/18/2023 1910   ALT 13 03/09/2023 1204   ALKPHOS 55 04/18/2023 1910   BILITOT 0.5 04/18/2023 1910   BILITOT 0.6 03/09/2023 1204   GFRNONAA 53 (L) 04/18/2023 1910   GFRNONAA 44 (L) 03/09/2023 1204   GFRNONAA 45 (L) 10/20/2020 1542   GFRAA 52 (L) 10/20/2020 1542      ASSESSMENT and THERAPY PLAN:   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 Current count 144,000 post-IVIG. Rituximab considered for long-term management with 50% response rate. Discussed potential allergic reactions and temporary immunosuppression. Emphasized need for longer-lasting treatment. - Submit rituximab request to insurance. - Plan rituximab: weekly  for four weeks, pending approval. - Coordinate with cardiologist for Watchman device placement. - Monitor platelet counts regularly. - Educate on rituximab side effects and monitoring.  Anticoagulation Management On Eliquis, transition planned post-Watchman device placement. No significant bleeding, minor bleeding noted. Advised to continue Eliquis until cardiologist advises otherwise. - Coordinate with Dr. Lalla Brothers for Watchman placement. - Expedite surgical scheduling with cardiologist. - Continue Eliquis until further notice.  Follow-up Follow-up with hematology and cardiology to coordinate treatment. Appointment with Dr. Bjorn Pippin on April 9th. Advised to inform cardiologist of travel plans to avoid scheduling conflicts. - Schedule follow-up in one month. - Coordinate Watchman placement and rituximab treatment with cardiologist. - Inform cardiologist of travel plans.  *Total Encounter Time as defined by the Centers for Medicare and Medicaid Services includes, in addition to the face-to-face time of a patient visit (documented in the note above) non-face-to-face time: obtaining and reviewing outside history, ordering and reviewing medications, tests or procedures, care coordination (communications with other health care professionals or caregivers) and documentation in the medical record.

## 2023-06-28 NOTE — Progress Notes (Signed)
 START ON PATHWAY REGIMEN - Immune Thrombocytopenia (ITP)     A cycle is every 7 days:     Rituximab-xxxx   **Always confirm dose/schedule in your pharmacy ordering system**  Patient Characteristics: Relapsed/Refractory, Rapid Response Not Needed, Second Line, Time to Relapse  < 6 Months, Not a Candidate for Further Steroid Therapy Disease Status: Relapsed / Refractory Line of Therapy: Second Line Time to Relapse: < 6 Months Candidate for Further Steroid Therapy<= No Intent of Therapy: Curative Intent, Discussed with Patient

## 2023-06-29 ENCOUNTER — Telehealth: Payer: Self-pay | Admitting: Hematology and Oncology

## 2023-06-29 NOTE — Telephone Encounter (Signed)
 Spoke with patient confirming upcoming appointment

## 2023-07-04 ENCOUNTER — Ambulatory Visit: Payer: Medicare PPO | Admitting: Nurse Practitioner

## 2023-07-16 ENCOUNTER — Other Ambulatory Visit: Payer: Self-pay | Admitting: Family

## 2023-07-20 ENCOUNTER — Other Ambulatory Visit: Payer: Self-pay | Admitting: Internal Medicine

## 2023-07-20 DIAGNOSIS — G8929 Other chronic pain: Secondary | ICD-10-CM

## 2023-07-20 DIAGNOSIS — R109 Unspecified abdominal pain: Secondary | ICD-10-CM

## 2023-07-23 ENCOUNTER — Other Ambulatory Visit: Payer: Self-pay | Admitting: Hematology and Oncology

## 2023-07-23 DIAGNOSIS — D693 Immune thrombocytopenic purpura: Secondary | ICD-10-CM

## 2023-07-24 ENCOUNTER — Other Ambulatory Visit: Payer: Self-pay | Admitting: Hematology and Oncology

## 2023-07-24 DIAGNOSIS — D693 Immune thrombocytopenic purpura: Secondary | ICD-10-CM

## 2023-07-25 ENCOUNTER — Ambulatory Visit: Payer: Medicare PPO | Attending: Cardiology | Admitting: Cardiology

## 2023-07-25 ENCOUNTER — Encounter: Payer: Self-pay | Admitting: Hematology and Oncology

## 2023-07-25 ENCOUNTER — Encounter: Payer: Self-pay | Admitting: Cardiology

## 2023-07-25 VITALS — BP 120/74 | HR 69 | Ht 64.0 in | Wt 158.2 lb

## 2023-07-25 DIAGNOSIS — D696 Thrombocytopenia, unspecified: Secondary | ICD-10-CM | POA: Diagnosis not present

## 2023-07-25 DIAGNOSIS — I10 Essential (primary) hypertension: Secondary | ICD-10-CM

## 2023-07-25 DIAGNOSIS — I480 Paroxysmal atrial fibrillation: Secondary | ICD-10-CM | POA: Diagnosis not present

## 2023-07-25 DIAGNOSIS — G4733 Obstructive sleep apnea (adult) (pediatric): Secondary | ICD-10-CM

## 2023-07-25 NOTE — Patient Instructions (Addendum)
 Medication Instructions:  Continue current medication *If you need a refill on your cardiac medications before your next appointment, please call your pharmacy*  Lab Work: none If you have labs (blood work) drawn today and your tests are completely normal, you will receive your results only by: MyChart Message (if you have MyChart) OR A paper copy in the mail If you have any lab test that is abnormal or we need to change your treatment, we will call you to review the results.  Testing/Procedures: WatchPAT?  Is a FDA cleared portable home sleep study test that uses a watch and 3 points of contact to monitor 7 different channels, including your heart rate, oxygen saturations, body position, snoring, and chest motion.  The study is easy to use from the comfort of your own home and accurately detect sleep apnea.  Before bed, you attach the chest sensor, attached the sleep apnea bracelet to your nondominant hand, and attach the finger probe.  After the study, the raw data is downloaded from the watch and scored for apnea events.   For more information: [email or web address]  Patient Testing Instructions:  Do not put battery into the device until bedtime when you are ready to begin the test. Please call the support number if you need assistance after following the instructions below: 24 hour support line- [phone] or [facility] support at [phone] (option 2)  Download the IntelWatchPAT One" app through the google play store or App Store  Be sure to turn on or enable access to bluetooth in settlings on your smartphone/ device  Make sure no other bluetooth devices are on and within the vicinity of your smartphone/ device and WatchPAT watch during testing.  Make sure to leave your smart phone/ device plugged in and charging all night.  When ready for bed:  Follow the instructions step by step in the WatchPAT One App to activate the testing device. For additional instructions,  including video instruction, visit the WatchPAT One video on Youtube. You can search for WatchPat One within Youtube (video is 4 minutes and 18 seconds) or enter: https://youtube/watch?[email or web address]_vbiwxE Please note: You will be prompted to enter a Pin to connect via bluetooth when starting the test. The PIN will be assigned to you when you receive the test.  The device is disposable, but it recommended that you retain the device until you receive a call letting you know the study has been received and the results have been interpreted.  We will let you know if the study did not transmit to [place] properly after the test is completed. You do not need to call us to confirm the receipt of the test.  Please complete the test within 48 hours of receiving PIN.   Frequently Asked Questions:  What is Watch Dennie Bible one?  A single use fully disposable home sleep apnea testing device and will not need to be returned after completion.  What are the requirements to use WatchPAT one?  The be able to have a successful watchpat one sleep study, you should have your Watch pat one device, your smart phone, watch pat one app, your PIN number and Internet access What type of phone do I need?  You should have a smart phone that uses Android 5.1 and above or any Iphone with IOS 10 and above How can I download the WatchPAT one app?  Based on your device type search for WatchPAT one app either in google play for android devices or APP store for Iphone's Where will  I get my PIN for the study?  Your PIN will be provided by your physician's office. It is used for authentication and if you lose/forget your PIN, please reach out to your providers office.  I do not have Internet at home. Can I do WatchPAT one study?  WatchPAT One needs Internet connection throughout the night to be able to transmit the sleep data. You can use your home/local internet or your cellular's data package. However, it is always recommended to use home/local  Internet. It is estimated that between 20MB-30MB will be used with each study.However, the application will be looking for space in the phone to start the study.  What happens if I lose internet or bluetooth connection?  During the internet disconnection, your phone will not be able to transmit the sleep data. All the data, will be stored in your phone. As soon as the internet connection is back on, the phone will being sending the sleep data. During the bluetooth disconnection, WatchPAT one will not be able to to send the sleep data to your phone. Data will be kept in the [facility] one until two devices have bluetooth connection back on. As soon as the connection is back on, WatchPAT one will send the sleep data to the phone.  How long do I need to wear the WatchPAT one?  After you start the study, you should wear the device at least 6 hours.  How far should I keep my phone from the device?  During the night, your phone should be within 15 feet.  What happens if I leave the room for restroom or other reasons?  Leaving the room for any reason will not cause any problem. As soon as your get back to the room, both devices will reconnect and will continue to send the sleep data. Can I use my phone during the sleep study?  Yes, you can use your phone as usual during the study. But it is recommended to put your watchpat one on when you are ready to go to bed.  How will I get my study results?  A soon as you completed your study, your sleep data will be sent to the provider. They will then share the results with you when they are ready.     Follow-Up: At [facility], you and your health needs are our priority.  As part of our continuing mission to provide you with exceptional heart care, our providers are all part of one team.  This team includes your primary Cardiologist (physician) and Advanced Practice Providers or APPs (Physician Assistants and Nurse Practitioners) who all work together  to provide you with the care you need, when you need it.  Your next appointment:   4 month(s)  Provider:   [name], MD     We recommend signing up for the patient portal called "MyChart".  Sign up information is provided on this After Visit Summary.  MyChart is used to connect with patients for Virtual Visits (Telemedicine).  Patients are able to view lab/test results, encounter notes, upcoming appointments, etc.  Non-urgent messages can be sent to your provider as well.   To learn more about what you can do with MyChart, go to [email or web address].   Other Instructions none      1st Floor: - Lobby - Registration  - Pharmacy  - Lab - Cafe  2nd Floor: - PV Lab - Diagnostic Testing (echo, CT, nuclear med)  3rd Floor: - Vacant  4th  Floor: - TCTS (cardiothoracic surgery) - AFib Clinic - Structural Heart Clinic - Vascular Surgery  - Vascular Ultrasound  5th Floor: - HeartCare Cardiology (general and EP) - Clinical Pharmacy for coumadin, hypertension, lipid, weight-loss medications, and med management appointments    Valet parking services will be available as well.

## 2023-07-25 NOTE — Progress Notes (Signed)
 Cardiology Office Note:    Date:  07/25/2023   ID:  Jillian Rodgers, DOB [birth year], MRN [id number]  PCP:  [name], MD  Cardiologist:  [name], MD  Electrophysiologist:  Lanier Prude, MD   Referring MD: [name], MD   Chief Complaint  Patient presents with   Atrial Fibrillation    History of Present Illness:    Jillian Rodgers is a 78 y.o. female with a hx of paroxysmal atrial fibrillation, chronic thrombocytopenia, TIA, CKD stage III, hypertension, hypothyroidism, hyperlipidemia who presents for follow-up.  She was diagnosed with A-fib 10/2020, underwent successful DCCV at that time.  She was taken off anticoagulation due to chronic soft thrombocytopenia.  Admitted 10/2022 with TIA.  She was admitted 12/2022 with A-fib with RVR.  During admission, her platelets were stable in 60s and in discussion with hematology she tolerated heparin drip she was transitioned to Eliquis.  She spontaneously converted to normal sinus rhythm during admission and was discharged on Eliquis and metoprolol.  At follow-up in 01/2023, her platelet count had declined to 30s and Eliquis was held.  Underwent steroid treatment for thrombocytopenia on 02/2023 and IVIG 03/2023 with improvement in platelet count and restarted on Eliquis.  She was admitted 03/2023 with recurrent A-fib with RVR, was continued on Eliquis 5 mg twice daily, and she spontaneously converted to sinus rhythm during admission.  She was seen by Dr. Lalla Brothers on 04/17/2023, planning A-fib ablation/Watchman procedure.  She was admitted with recurrent A-fib 04/18/2023.    Since last clinic visit, she reports has been doing OK.  Denies any chest pain, lightheadedness, syncope. Does report some occasional palpitations, has felt like had more Afib but only lasting 30 minutes or so.  Also feels dyspnea at times.   Reports some lightheadedness but no syncope.  Reports leg edema has improved.  She has not needed to take any  Lasix recently.  Denies any bleeding on Eliquis.   Past Medical History:  Diagnosis Date   Anemia with low platelet count (HCC)    Chronic kidney disease    Degenerative joint disease    Dysrhythmia    a fib   GERD (gastroesophageal reflux disease)    History of kidney stones    Hyperlipidemia    Hypothyroidism    Inguinal hernia 02/19/2019   Labile hypertension    Migraines    neurontin helps   Pneumonia    hx of walking pneumonia    Primary hypertension 04/28/2022    Past Surgical History:  Procedure Laterality Date   BILATERAL CATARACT SURGERY      BLERPHOROPLASTY \     CHOLECYSTECTOMY  1996   open   COLONOSCOPY  2009   recommended 10 year f/u   HERNIA REPAIR  1985   umb hernia rpr   HERNIA REPAIR  2005 & 2009   ventral hernia rpr   INGUINAL HERNIA REPAIR Left 04/30/2020   Procedure: OPEN LEFT INGUINAL HERNIA REPAIR WITH MESH;  Surgeon: Berna Bue, MD;  Location: [facility];  Service: General;  Laterality: Left;   INGUINAL HERNIA REPAIR Right 10/24/2021   Procedure: OPEN RIGHT INGUINAL HERNIA REPAIR WITH MESH ;  Surgeon: Berna Bue, MD;  Location: [facility];  Service: General;  Laterality: Right;   PILONIDAL CYST EXCISION  1972   SKIN CANCER REMOVAL      TONSILLECTOMY     UPPER GI ENDOSCOPY     VENTRAL HERNIA REPAIR      Current  Medications: Current Meds  Medication Sig   apixaban (ELIQUIS) 5 MG TABS tablet Take 1 tablet (5 mg total) by mouth 2 (two) times daily.   Ascorbic Acid (VITAMIN C) 500 MG CAPS Take 500 mg by mouth daily.    atorvastatin (LIPITOR) 80 MG tablet Take 1 tablet (80 mg total) by mouth daily.   BOTOX 100 units SOLR injection Inject 100 Units into the muscle See admin instructions. Inject 100 units intramuscularly every 6-8 weeks   cetirizine (ZYRTEC) 10 MG tablet Take 10 mg by mouth daily.   Cholecalciferol (VITAMIN D3) 125 MCG (5000 UT) TABS Take 5,000 Units by mouth daily.   diltiazem (CARDIZEM CD) 180 MG 24 hr capsule Take 1 capsule  (180 mg total) by mouth daily.   divalproex (DEPAKOTE ER) 500 MG 24 hr tablet Take 1 tablet (500 mg total) by mouth daily.   furosemide (LASIX) 20 MG tablet TAKE 1 TABLET(20 MG) BY MOUTH DAILY AS NEEDED   gabapentin (NEURONTIN) 300 MG capsule Take 300 mg (1 tablet) in the morning and 600 mg (2 tablets) at bedtime   levothyroxine (SYNTHROID) 50 MCG tablet Take  1 tablet  Daily  on an empty stomach with only water for 30 minutes & no Antacid meds, Calcium or Magnesium for 4 hours & avoid Biotin                                                                                                                    /                                                                   TAKE                                         BY                                                 MOUTH (Patient taking differently: Take 50 mcg by mouth See admin instructions. Take 50 mcg by mouth in the morning on an empty stomach, with only water. Take no antacids, calcium, or magnesium for 4 hours and avoid biotin.)   Magnesium Oxide 400 MG CAPS Take 1 capsule (400 mg total) by mouth at bedtime.   metoprolol tartrate (LOPRESSOR) 25 MG tablet Take 1.5 tablets (37.5 mg total) by mouth 2 (two) times daily.   oxymetazoline (AFRIN) 0.05 % nasal spray Place 1 spray into both nostrils 2 (two) times daily. For 5 days   pantoprazole (PROTONIX) 40 MG tablet  Take  1 tablet  2 x /day  for Acid Indigestion & Heartburn   sodium chloride (OCEAN) 0.65 % SOLN nasal spray Place 1 spray into both nostrils as needed for congestion.   SYSTANE BALANCE 0.6 % SOLN Place 1 drop into both eyes at bedtime as needed (dry eyes).   SYSTANE ULTRA 0.4-0.3 % SOLN Place 1 drop into both eyes daily as needed (dry eyes).   TYLENOL 500 MG tablet Take 500-1,000 mg by mouth every 6 (six) hours as needed for mild pain or headache.   Zinc 30 MG TABS Take 30 mg by mouth daily.     Allergies:   Patient has no known allergies.   Social History   Socioeconomic History    Marital status: Married    Spouse name: Not on file   Number of children: 2   Years of education: 18   Highest education level: Not on file  Occupational History   Not on file  Tobacco Use   Smoking status: Never   Smokeless tobacco: Never  Vaping Use   Vaping status: Never Used  Substance and Sexual Activity   Alcohol use: No   Drug use: No   Sexual activity: Yes  Other Topics Concern   Not on file  Social History Narrative   Live with husband    Retired    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  Food Insecurity: No Food Insecurity (03/25/2023)   Hunger Vital Sign    Worried About Running Out of Food in the Last Year: Never true    Ran Out of Food in the Last Year: Never true  Transportation Needs: No Transportation Needs (03/25/2023)   PRAPARE - Administrator, Civil Service (Medical): No    Lack of Transportation (Non-Medical): No  Physical Activity: Not on file  Stress: Not on file  Social Connections: Not on file     Family History: The patient's family history includes Breast cancer in her sister; Cancer in her mother; Congestive Heart Failure in her father; Gout in her maternal aunt; Heart disease in her mother; Hypertension in her mother; Ovarian cancer in her mother; Stroke in her paternal uncle; Uterine cancer in her mother. There is no history of Colon cancer, Esophageal cancer, Stomach cancer, or Rectal cancer.  ROS:   Please see the history of present illness.     All other systems reviewed and are negative.  EKGs/Labs/Other Studies Reviewed:    The following studies were reviewed today:   EKG:   04/23/2023: Sinus bradycardia, rate 55, no ST abnormalities 07/25/23: Normal sinus rhythm, rate 69, no ST abnormalities  Recent Labs: 03/23/2023: TSH 3.248 03/25/2023: Magnesium 2.2 04/18/2023: ALT 28; B Natriuretic Peptide 721.8 04/20/2023: BUN 23; Creatinine, Ser 1.45; Potassium 4.8; Sodium 140 06/28/2023: Hemoglobin 10.6;  Platelets 144  Recent Lipid Panel    Component Value Date/Time   CHOL 143 01/03/2023 1533   TRIG 94 01/03/2023 1533   HDL 72 01/03/2023 1533   CHOLHDL 2.0 01/03/2023 1533   VLDL 34 (H) 09/26/2016 1147   LDLCALC 53 01/03/2023 1533    Physical Exam:    VS:  BP 120/74 (BP Location: Right Arm, Patient Position: Sitting, Cuff Size: Normal)   Pulse 69   Ht 5\' 4"  (1.626 m)   Wt 158 lb 3.2 oz (71.8 kg)   SpO2 95%   BMI 27.15 kg/m     Wt Readings from Last 3 Encounters:  07/25/23 158  lb 3.2 oz (71.8 kg)  06/28/23 158 lb 3.2 oz (71.8 kg)  06/20/23 162 lb (73.5 kg)     GEN:  Well nourished, well developed in no acute distress HEENT: Normal NECK: No JVD; No carotid bruits LYMPHATICS: No lymphadenopathy CARDIAC: RRR, no murmurs, rubs, gallops RESPIRATORY:  Clear to auscultation without rales, wheezing or rhonchi  ABDOMEN: Soft, non-tender, non-distended MUSCULOSKELETAL:  trace edema SKIN: Warm and dry NEUROLOGIC:  Alert and oriented x 3 PSYCHIATRIC:  Normal affect   ASSESSMENT:    1. Paroxysmal atrial fibrillation (HCC)   2. Primary hypertension   3. OSA (obstructive sleep apnea)   4. Thrombocytopenia (HCC)     PLAN:    Paroxysmal atrial fibrillation: Complicated situation, has had recurrent admissions for A-fib with RVR but has been unable to tolerate anticoagulation despite high CHA2DS2-VASc score (6, hypertension, age x 2, TIA, female) due to chronic thrombocytopenia.  Echocardiogram 12/2022 showed EF 60 to 65%, normal RV function, severe left atrial enlargement, no significant valvular disease -Seen by Dr. Lalla Brothers in EP, planning ablation/Watchman procedure on 6/16.  Will send message to Dr Al Pimple to see if patient can receive IVIG treatment prior to procedure to maximize platelet count for procedure -Continue Eliquis 5 mg twice daily for now -Continue metoprolol 37.5 mg twice daily and cardizem 180 mg daily  Chronic thrombocytopenia: Follows with hematology, likely ITP.   Treated with steroids 02/2023 and IVIG 03/2023, with improvement in counts.  Platelets were again down to 39 on 06/18/2023.  Underwent additional IVIG treatment and most recent platelets 144 on 06/28/2023  Lower extremity edema: On as needed Lasix 20 mg, has not needed to use recently.  Suspect venous insufficiency, compression stockings recommended  Hypertension: Continue metoprolol and diltiazem, appears controlled  TIA: Continue atorvastatin, Eliquis  OSA: Has known OSA but has not been on CPAP.  Itamar ordered last year but states she was not called to activate device.  Discussed with sleep coordinator  RTC in 4 months   Medication Adjustments/Labs and Tests Ordered: Current medicines are reviewed at length with the patient today.  Concerns regarding medicines are outlined above.  Orders Placed This Encounter  Procedures   EKG 12-Lead   [facility] Sleep Study   No orders of the defined types were placed in this encounter.   Patient Instructions  Medication Instructions:  Continue current medication *If you need a refill on your cardiac medications before your next appointment, please call your pharmacy*  Lab Work: none If you have labs (blood work) drawn today and your tests are completely normal, you will receive your results only by: MyChart Message (if you have MyChart) OR A paper copy in the mail If you have any lab test that is abnormal or we need to change your treatment, we will call you to review the results.  Testing/Procedures: WatchPAT?  Is a FDA cleared portable home sleep study test that uses a watch and 3 points of contact to monitor 7 different channels, including your heart rate, oxygen saturations, body position, snoring, and chest motion.  The study is easy to use from the comfort of your own home and accurately detect sleep apnea.  Before bed, you attach the chest sensor, attached the sleep apnea bracelet to your nondominant hand, and attach the finger probe.   After the study, the raw data is downloaded from the watch and scored for apnea events.   For more information: [email or web address]  Patient Testing Instructions:  Do not put battery into the device  until bedtime when you are ready to begin the test. Please call the support number if you need assistance after following the instructions below: 24 hour support line- [phone] or [facility] support at [phone] (option 2)  Download the IntelWatchPAT One" app through the google play store or App Store  Be sure to turn on or enable access to bluetooth in settlings on your smartphone/ device  Make sure no other bluetooth devices are on and within the vicinity of your smartphone/ device and WatchPAT watch during testing.  Make sure to leave your smart phone/ device plugged in and charging all night.  When ready for bed:  Follow the instructions step by step in the WatchPAT One App to activate the testing device. For additional instructions, including video instruction, visit the WatchPAT One video on Youtube. You can search for WatchPat One within Youtube (video is 4 minutes and 18 seconds) or enter: https://youtube/watch?[email or web address]_vbiwxE Please note: You will be prompted to enter a Pin to connect via bluetooth when starting the test. The PIN will be assigned to you when you receive the test.  The device is disposable, but it recommended that you retain the device until you receive a call letting you know the study has been received and the results have been interpreted.  We will let you know if the study did not transmit to [place] properly after the test is completed. You do not need to call us to confirm the receipt of the test.  Please complete the test within 48 hours of receiving PIN.   Frequently Asked Questions:  What is Watch Dennie Bible one?  A single use fully disposable home sleep apnea testing device and will not need to be returned after completion.  What are the requirements to  use WatchPAT one?  The be able to have a successful watchpat one sleep study, you should have your Watch pat one device, your smart phone, watch pat one app, your PIN number and Internet access What type of phone do I need?  You should have a smart phone that uses Android 5.1 and above or any Iphone with IOS 10 and above How can I download the WatchPAT one app?  Based on your device type search for WatchPAT one app either in google play for android devices or APP store for Iphone's Where will I get my PIN for the study?  Your PIN will be provided by your physician's office. It is used for authentication and if you lose/forget your PIN, please reach out to your providers office.  I do not have Internet at home. Can I do WatchPAT one study?  WatchPAT One needs Internet connection throughout the night to be able to transmit the sleep data. You can use your home/local internet or your cellular's data package. However, it is always recommended to use home/local Internet. It is estimated that between 20MB-30MB will be used with each study.However, the application will be looking for space in the phone to start the study.  What happens if I lose internet or bluetooth connection?  During the internet disconnection, your phone will not be able to transmit the sleep data. All the data, will be stored in your phone. As soon as the internet connection is back on, the phone will being sending the sleep data. During the bluetooth disconnection, WatchPAT one will not be able to to send the sleep data to your phone. Data will be kept in the [facility] one until two devices have bluetooth connection back on.  As soon as the connection is back on, WatchPAT one will send the sleep data to the phone.  How long do I need to wear the WatchPAT one?  After you start the study, you should wear the device at least 6 hours.  How far should I keep my phone from the device?  During the night, your phone should be within 15  feet.  What happens if I leave the room for restroom or other reasons?  Leaving the room for any reason will not cause any problem. As soon as your get back to the room, both devices will reconnect and will continue to send the sleep data. Can I use my phone during the sleep study?  Yes, you can use your phone as usual during the study. But it is recommended to put your watchpat one on when you are ready to go to bed.  How will I get my study results?  A soon as you completed your study, your sleep data will be sent to the provider. They will then share the results with you when they are ready.     Follow-Up: At [facility], you and your health needs are our priority.  As part of our continuing mission to provide you with exceptional heart care, our providers are all part of one team.  This team includes your primary Cardiologist (physician) and Advanced Practice Providers or APPs (Physician Assistants and Nurse Practitioners) who all work together to provide you with the care you need, when you need it.  Your next appointment:   4 month(s)  Provider:   [name], MD     We recommend signing up for the patient portal called "MyChart".  Sign up information is provided on this After Visit Summary.  MyChart is used to connect with patients for Virtual Visits (Telemedicine).  Patients are able to view lab/test results, encounter notes, upcoming appointments, etc.  Non-urgent messages can be sent to your provider as well.   To learn more about what you can do with MyChart, go to [email or web address].   Other Instructions none      1st Floor: - Lobby - Registration  - Pharmacy  - Lab - Cafe  2nd Floor: - PV Lab - Diagnostic Testing (echo, CT, nuclear med)  3rd Floor: - Vacant  4th Floor: - TCTS (cardiothoracic surgery) - AFib Clinic - Structural Heart Clinic - Vascular Surgery  - Vascular Ultrasound  5th Floor: - HeartCare Cardiology (general and  EP) - Clinical Pharmacy for coumadin, hypertension, lipid, weight-loss medications, and med management appointments    Valet parking services will be available as well.      Signed, [name], MD  07/25/2023 9:59 AM    [facility]

## 2023-07-27 ENCOUNTER — Inpatient Hospital Stay: Attending: Hematology and Oncology

## 2023-07-27 ENCOUNTER — Ambulatory Visit: Admitting: Hematology and Oncology

## 2023-07-27 ENCOUNTER — Inpatient Hospital Stay

## 2023-07-27 ENCOUNTER — Inpatient Hospital Stay: Admitting: Hematology and Oncology

## 2023-07-27 VITALS — BP 105/69 | HR 68 | Temp 98.2°F | Resp 17 | Wt 156.4 lb

## 2023-07-27 DIAGNOSIS — Z809 Family history of malignant neoplasm, unspecified: Secondary | ICD-10-CM | POA: Diagnosis not present

## 2023-07-27 DIAGNOSIS — I4891 Unspecified atrial fibrillation: Secondary | ICD-10-CM | POA: Insufficient documentation

## 2023-07-27 DIAGNOSIS — Z803 Family history of malignant neoplasm of breast: Secondary | ICD-10-CM | POA: Diagnosis not present

## 2023-07-27 DIAGNOSIS — D693 Immune thrombocytopenic purpura: Secondary | ICD-10-CM

## 2023-07-27 DIAGNOSIS — D696 Thrombocytopenia, unspecified: Secondary | ICD-10-CM | POA: Insufficient documentation

## 2023-07-27 DIAGNOSIS — Z7901 Long term (current) use of anticoagulants: Secondary | ICD-10-CM | POA: Insufficient documentation

## 2023-07-27 DIAGNOSIS — Z8041 Family history of malignant neoplasm of ovary: Secondary | ICD-10-CM | POA: Insufficient documentation

## 2023-07-27 LAB — CBC WITH DIFFERENTIAL/PLATELET
Abs Immature Granulocytes: 0.04 10*3/uL (ref 0.00–0.07)
Basophils Absolute: 0 10*3/uL (ref 0.0–0.1)
Basophils Relative: 1 %
Eosinophils Absolute: 0.1 10*3/uL (ref 0.0–0.5)
Eosinophils Relative: 1 %
HCT: 37.4 % (ref 36.0–46.0)
Hemoglobin: 12.4 g/dL (ref 12.0–15.0)
Immature Granulocytes: 1 %
Lymphocytes Relative: 22 %
Lymphs Abs: 1.4 10*3/uL (ref 0.7–4.0)
MCH: 30.8 pg (ref 26.0–34.0)
MCHC: 33.2 g/dL (ref 30.0–36.0)
MCV: 92.8 fL (ref 80.0–100.0)
Monocytes Absolute: 0.8 10*3/uL (ref 0.1–1.0)
Monocytes Relative: 12 %
Neutro Abs: 4.2 10*3/uL (ref 1.7–7.7)
Neutrophils Relative %: 63 %
Platelets: 74 10*3/uL — ABNORMAL LOW (ref 150–400)
RBC: 4.03 MIL/uL (ref 3.87–5.11)
RDW: 14.8 % (ref 11.5–15.5)
WBC: 6.6 10*3/uL (ref 4.0–10.5)
nRBC: 0 % (ref 0.0–0.2)

## 2023-07-27 NOTE — Progress Notes (Signed)
 [facility] Cancer Follow up:    Jillian Forest, MD [address] [place] [address] [place] [address]   DIAGNOSIS: Thrombocytopenia  SUMMARY OF HEMATOLOGIC HISTORY:  Underwent thrombocytopenia lab evaluation inn November 2021 without no evidence of hepatitis, hypothyroidism, nutritional deficiencies.  MR of the abdomen from May 2021 shows no evidence of liver disease or splenomegaly.  Bone marrow biopsy from 03/19/2020 showed hypercellularity, no evidence of dyspoeisis, megakaryocytic hyperplasia. Flow cytometry negative for any monoclonal B cell population. Cytogenetics were normal.  Bone marrow biopsy 02/13/2023, overall normocellular marrow with orderly trilineage hematopoiesis and megakaryocytic hyperplasia. 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 Cytogenetics showed normal female karyotype.  Steroids 02/22/2023 which seemed to improve the platelet count but led to a slump in energy levels after discontinuation-minimal improvement in platelet count IVIG 03/19/2023 and 03/22/2023  CURRENT THERAPY: Observation  INTERVAL HISTORY: Discussed the use of AI scribe software for clinical note transcription with the patient, who gave verbal consent to proceed.  History of Present Illness    The patient, with immune thrombocytopenia, presents for follow-up on her platelet count and treatment options.  History of Present Illness Jillian Rodgers is a 78 year old female with thrombocytopenia who presents for follow-up on her platelet count and treatment options. Her husband was present during the conversation about treatment options.  She has a history of thrombocytopenia with a current platelet count of 74,000, which she considers 'pretty good.' She has not initiated rituximab treatment, although it was previously discussed. She has received IVIG in the past, which provided short-term improvement.  Her surgery is scheduled for June, and her platelet count is stable at present.  She experiences episodes of atrial fibrillation that are intermittent. She is currently taking Eliquis and has no bleeding issues.  Rest of the pertinent 10 point ROS reviewed and neg.  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  MEDICAL HISTORY: Past Medical History:  Diagnosis Date   Anemia with low platelet count (HCC)    Chronic kidney disease    Degenerative joint disease    Dysrhythmia    a fib   GERD (gastroesophageal reflux disease)    History of kidney stones    Hyperlipidemia    Hypothyroidism    Inguinal hernia 02/19/2019   Labile hypertension    Migraines    neurontin helps   Pneumonia    hx of walking pneumonia    Primary hypertension 04/28/2022  SURGICAL HISTORY: Past Surgical History:  Procedure Laterality Date   BILATERAL CATARACT SURGERY      BLERPHOROPLASTY \      CHOLECYSTECTOMY  1996   open   COLONOSCOPY  2009   recommended 10 year f/u   HERNIA REPAIR  1985   umb hernia rpr   HERNIA REPAIR  2005 & 2009   ventral hernia rpr   INGUINAL HERNIA REPAIR Left 04/30/2020   Procedure: OPEN LEFT INGUINAL HERNIA REPAIR WITH MESH;  Surgeon: Berna Bue, MD;  Location: [facility];  Service: General;  Laterality: Left;   INGUINAL HERNIA REPAIR Right 10/24/2021   Procedure: OPEN RIGHT INGUINAL HERNIA REPAIR WITH MESH ;  Surgeon: Berna Bue, MD;  Location: [facility];  Service: General;  Laterality: Right;   PILONIDAL CYST EXCISION  1972   SKIN CANCER REMOVAL      TONSILLECTOMY     UPPER GI ENDOSCOPY     VENTRAL HERNIA REPAIR      SOCIAL HISTORY: Social History   Socioeconomic History   Marital status: Married    Spouse name: Not on file   Number of children: 2   Years of education: 18   Highest education level: Not on file  Occupational History   Not on file  Tobacco Use   Smoking status: Never   Smokeless tobacco: Never  Vaping Use   Vaping status: Never Used  Substance and Sexual Activity   Alcohol use: No   Drug use: No   Sexual activity: Yes  Other Topics Concern   Not on file  Social History Narrative   Live with husband    Retired    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  Food Insecurity: No Food Insecurity (03/25/2023)   Hunger Vital Sign    Worried About Running Out of Food in the Last Year: Never true    Ran Out of Food in the Last Year: Never true  Transportation Needs: No Transportation Needs (03/25/2023)   PRAPARE - Administrator, Civil Service (Medical): No    Lack of Transportation (Non-Medical): No  Physical Activity: Not on file  Stress: Not on file  Social Connections: Not on file  Intimate Partner Violence: Not At Risk (03/25/2023)   Humiliation, Afraid, Rape, and Kick questionnaire    Fear of Current or Ex-Partner: No    Emotionally Abused: No    Physically Abused:  No    Sexually Abused: No    FAMILY HISTORY: Family History  Problem Relation Age of Onset   Ovarian cancer Mother    Heart disease Mother    Cancer Mother    Uterine cancer Mother    Hypertension Mother    Breast cancer Sister    Stroke Paternal Uncle    Congestive Heart Failure Father    Gout Maternal Aunt    Colon cancer Neg Hx    Esophageal cancer Neg Hx    Stomach cancer Neg Hx    Rectal cancer Neg Hx     Review of Systems  Constitutional:  Negative for appetite change, chills, fatigue, fever and unexpected weight change.  HENT:   Negative for hearing loss, lump/mass and trouble swallowing.   Eyes:  Negative for eye problems and icterus.  Respiratory:  Negative for chest tightness, cough and shortness of breath.   Cardiovascular:  Negative for chest pain, leg swelling and palpitations.  Gastrointestinal:  Negative for abdominal distention, abdominal pain, constipation, diarrhea, nausea  and vomiting.  Endocrine: Negative for hot flashes.  Genitourinary:  Negative for difficulty urinating.   Musculoskeletal:  Negative for arthralgias.  Skin:  Negative for itching and rash.  Neurological:  Negative for dizziness, extremity weakness, headaches and numbness.  Hematological:  Negative for adenopathy. Does not bruise/bleed easily.  Psychiatric/Behavioral:  Negative for depression. The patient is not nervous/anxious.   All other systems reviewed and are negative.     PHYSICAL EXAMINATION    Vitals:   07/27/23 1143  BP: 105/69  Pulse: 68  Resp: 17  Temp: 98.2 F (36.8 C)  SpO2: 96%   Alert, oriented no acute distress CTA bilaterally RRR No LE edema  LABORATORY DATA:  CBC    Component Value Date/Time   WBC 6.6 07/27/2023 1125   RBC 4.03 07/27/2023 1125   HGB 12.4 07/27/2023 1125   HGB 11.3 04/20/2023 1445   HCT 37.4 07/27/2023 1125   HCT 34.8 04/20/2023 1445   PLT 74 (L) 07/27/2023 1125   PLT 81 (LL) 04/20/2023 1445   MCV 92.8 07/27/2023 1125   MCV  97 04/20/2023 1445   MCH 30.8 07/27/2023 1125   MCHC 33.2 07/27/2023 1125   RDW 14.8 07/27/2023 1125   RDW 13.8 04/20/2023 1445   LYMPHSABS 1.4 07/27/2023 1125   MONOABS 0.8 07/27/2023 1125   EOSABS 0.1 07/27/2023 1125   BASOSABS 0.0 07/27/2023 1125    CMP     Component Value Date/Time   NA 140 04/20/2023 1445   K 4.8 04/20/2023 1445   CL 100 04/20/2023 1445   CO2 23 04/20/2023 1445   GLUCOSE 99 04/20/2023 1445   GLUCOSE 97 04/18/2023 1910   BUN 23 04/20/2023 1445   CREATININE 1.45 (H) 04/20/2023 1445   CREATININE 1.24 (H) 03/09/2023 1204   CREATININE 1.27 (H) 01/18/2023 1219   CALCIUM 9.8 04/20/2023 1445   PROT 7.2 04/18/2023 1910   ALBUMIN 3.3 (L) 04/18/2023 1910   AST 25 04/18/2023 1910   AST 16 03/09/2023 1204   ALT 28 04/18/2023 1910   ALT 13 03/09/2023 1204   ALKPHOS 55 04/18/2023 1910   BILITOT 0.5 04/18/2023 1910   BILITOT 0.6 03/09/2023 1204   GFRNONAA 53 (L) 04/18/2023 1910   GFRNONAA 44 (L) 03/09/2023 1204   GFRNONAA 45 (L) 10/20/2020 1542   GFRAA 52 (L) 10/20/2020 1542    ASSESSMENT and THERAPY PLAN:   Assessment and Plan Assessment & Plan Thrombocytopenia Chronic thrombocytopenia with platelet count at 74,000. Previous IVIG effective but short-lived. Rituximab postponed given stable platelet count and after discussion about adverse effects. Consider IVIG if platelet count drops below 30,000 pre-surgery. - Postpone rituximab infusion. - Monitor platelet count. - Consider IVIG if platelet count drops below 30,000 before surgery.  Atrial Fibrillation Intermittent atrial fibrillation, currently absent. On Eliquis for anticoagulation. Surgery scheduled for June 16. - Continue Eliquis.  She will RTC in 4 weeks for follow up.  *Total Encounter Time as defined by the Centers for Medicare and Medicaid Services includes, in addition to the face-to-face time of a patient visit (documented in the note above) non-face-to-face time: obtaining and reviewing  outside history, ordering and reviewing medications, tests or procedures, care coordination (communications with other health care professionals or caregivers) and documentation in the medical record.

## 2023-08-07 ENCOUNTER — Encounter: Payer: Self-pay | Admitting: Internal Medicine

## 2023-08-10 ENCOUNTER — Ambulatory Visit
Admission: RE | Admit: 2023-08-10 | Discharge: 2023-08-10 | Disposition: A | Discharge status: Home / Self Care | Source: Ambulatory Visit | Attending: Internal Medicine | Admitting: Internal Medicine

## 2023-08-10 DIAGNOSIS — G8929 Other chronic pain: Secondary | ICD-10-CM

## 2023-08-10 DIAGNOSIS — R109 Unspecified abdominal pain: Secondary | ICD-10-CM

## 2023-08-14 ENCOUNTER — Other Ambulatory Visit: Payer: Self-pay | Admitting: Cardiology

## 2023-08-14 DIAGNOSIS — I480 Paroxysmal atrial fibrillation: Secondary | ICD-10-CM

## 2023-08-14 NOTE — Telephone Encounter (Signed)
 Prescription refill request for Eliquis  received. Indication:afib Last office visit:4/25 Scr:1.45  1/25 Age: 78 Weight:70.9  kg  Prescription refilled

## 2023-08-16 ENCOUNTER — Telehealth: Payer: Self-pay

## 2023-08-16 NOTE — Telephone Encounter (Signed)
 Due to scheduling conflicts, the patient will now be scheduled for concomitant ablation/Watchman on 11/01/2023. She was grateful for call and agreed with plan.   Will update Dr. Arno Bibles on surgery date.

## 2023-08-23 NOTE — Telephone Encounter (Signed)
 Called to arrange pre-procedure CT for PVI/LAAO.  Left message to call back Monday.

## 2023-09-04 ENCOUNTER — Telehealth: Payer: Self-pay

## 2023-09-04 NOTE — Telephone Encounter (Signed)
 Called patient and left a message about appointments for 5/21

## 2023-09-05 ENCOUNTER — Inpatient Hospital Stay: Admitting: Hematology and Oncology

## 2023-09-05 ENCOUNTER — Inpatient Hospital Stay: Attending: Hematology and Oncology

## 2023-09-05 VITALS — BP 105/70 | HR 64 | Temp 98.4°F | Resp 17 | Wt 159.3 lb

## 2023-09-05 DIAGNOSIS — Z803 Family history of malignant neoplasm of breast: Secondary | ICD-10-CM | POA: Insufficient documentation

## 2023-09-05 DIAGNOSIS — Z8041 Family history of malignant neoplasm of ovary: Secondary | ICD-10-CM | POA: Insufficient documentation

## 2023-09-05 DIAGNOSIS — D693 Immune thrombocytopenic purpura: Secondary | ICD-10-CM | POA: Diagnosis not present

## 2023-09-05 DIAGNOSIS — Z808 Family history of malignant neoplasm of other organs or systems: Secondary | ICD-10-CM | POA: Insufficient documentation

## 2023-09-05 DIAGNOSIS — M810 Age-related osteoporosis without current pathological fracture: Secondary | ICD-10-CM | POA: Insufficient documentation

## 2023-09-05 DIAGNOSIS — D696 Thrombocytopenia, unspecified: Secondary | ICD-10-CM

## 2023-09-05 DIAGNOSIS — Z7901 Long term (current) use of anticoagulants: Secondary | ICD-10-CM | POA: Diagnosis not present

## 2023-09-05 DIAGNOSIS — I4819 Other persistent atrial fibrillation: Secondary | ICD-10-CM

## 2023-09-05 LAB — CBC WITH DIFFERENTIAL/PLATELET
Abs Immature Granulocytes: 0.03 10*3/uL (ref 0.00–0.07)
Basophils Absolute: 0 10*3/uL (ref 0.0–0.1)
Basophils Relative: 1 %
Eosinophils Absolute: 0.1 10*3/uL (ref 0.0–0.5)
Eosinophils Relative: 1 %
HCT: 36 % (ref 36.0–46.0)
Hemoglobin: 11.7 g/dL — ABNORMAL LOW (ref 12.0–15.0)
Immature Granulocytes: 0 %
Lymphocytes Relative: 27 %
Lymphs Abs: 1.9 10*3/uL (ref 0.7–4.0)
MCH: 30.8 pg (ref 26.0–34.0)
MCHC: 32.5 g/dL (ref 30.0–36.0)
MCV: 94.7 fL (ref 80.0–100.0)
Monocytes Absolute: 0.9 10*3/uL (ref 0.1–1.0)
Monocytes Relative: 13 %
Neutro Abs: 4.1 10*3/uL (ref 1.7–7.7)
Neutrophils Relative %: 58 %
Platelets: 34 10*3/uL — ABNORMAL LOW (ref 150–400)
RBC: 3.8 MIL/uL — ABNORMAL LOW (ref 3.87–5.11)
RDW: 13.2 % (ref 11.5–15.5)
WBC: 7.1 10*3/uL (ref 4.0–10.5)
nRBC: 0 % (ref 0.0–0.2)

## 2023-09-05 NOTE — Progress Notes (Addendum)
 [facility] Cancer Follow up:    Jillian Batters, MD [address] [place] [address] [place] [address]   DIAGNOSIS: Thrombocytopenia  SUMMARY OF HEMATOLOGIC HISTORY:  Underwent thrombocytopenia lab evaluation inn November 2021 without no evidence of hepatitis, hypothyroidism, nutritional deficiencies.  MR of the abdomen from May 2021 shows no evidence of liver disease or splenomegaly.  Bone marrow biopsy from 03/19/2020 showed hypercellularity, no evidence of dyspoeisis, megakaryocytic hyperplasia. Flow cytometry negative for any monoclonal B cell population. Cytogenetics were normal.  Bone marrow biopsy 02/13/2023, overall normocellular marrow with orderly trilineage hematopoiesis and megakaryocytic hyperplasia. 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 Cytogenetics showed normal female karyotype.  Steroids 02/22/2023 which seemed to improve the platelet count but led to a slump in energy levels after discontinuation-minimal improvement in platelet count IVIG 03/19/2023 and 03/22/2023  CURRENT THERAPY: Observation  INTERVAL HISTORY: Discussed the use of AI scribe software for clinical note transcription with the patient, who gave verbal consent to proceed.  History of Present Illness    The patient, with immune thrombocytopenia, presents for follow-up on her platelet count and treatment options.  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  She is currently experiencing a low platelet count of 34,000, which has previously dropped to the 30,000s but has not gone lower. She is on Eliquis , a blood thinner, which increases the risk of bleeding with low platelet counts. No current bleeding issues are reported.  Her surgery, initially scheduled  earlier, has been postponed to July 17th due to logistical reasons related to the hospital's move to a new building. She is concerned about managing her platelet count until the surgery date.  She has previously been treated with IVIG, which temporarily increased her platelet count, but the effect was not long-lasting. She has not had success with steroids in the past.  She is also taking calcium  supplements due to concerns about bone density. She mentions receiving a shot, possibly 'Zometa', to help with bone strength and prevent fractures.  No new symptoms are reported and she denies any current bleeding issues.  Rest of the pertinent 10 point ROS reviewed and neg.  Patient Active Problem List   Diagnosis Date Noted   Hypokalemia 01/11/2023   Acute cystitis without hematuria 01/10/2023   History of TIA (transient ischemic attack) 01/10/2023   Atrial fibrillation with RVR (HCC) 01/09/2023   TIA (transient ischemic attack) 10/17/2022   Primary hypertension 04/28/2022   Snoring 03/04/2021   Paroxysmal atrial fibrillation (HCC) 11/01/2020   Secondary hypercoagulable state (HCC) 11/01/2020   Increased urinary frequency 10/20/2020   Localized osteoporosis without current pathological fracture 10/20/2020   IDA (iron deficiency anemia) 04/07/2020   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  MEDICAL HISTORY: Past Medical History:  Diagnosis Date   Anemia with low platelet count (HCC)    Chronic kidney disease  Degenerative joint disease    Dysrhythmia     a fib   GERD (gastroesophageal reflux disease)    History of kidney stones    Hyperlipidemia    Hypothyroidism    Inguinal hernia 02/19/2019   Labile hypertension    Migraines    neurontin  helps   Pneumonia    hx of walking pneumonia    Primary hypertension 04/28/2022    SURGICAL HISTORY: Past Surgical History:  Procedure Laterality Date   BILATERAL CATARACT SURGERY      BLERPHOROPLASTY \     CHOLECYSTECTOMY  1996   open   COLONOSCOPY  2009   recommended 10 year f/u   HERNIA REPAIR  1985   umb hernia rpr   HERNIA REPAIR  2005 & 2009   ventral hernia rpr   INGUINAL HERNIA REPAIR Left 04/30/2020   Procedure: OPEN LEFT INGUINAL HERNIA REPAIR WITH MESH;  Surgeon: Adalberto Acton, MD;  Location: [facility];  Service: General;  Laterality: Left;   INGUINAL HERNIA REPAIR Right 10/24/2021   Procedure: OPEN RIGHT INGUINAL HERNIA REPAIR WITH MESH ;  Surgeon: Adalberto Acton, MD;  Location: [facility];  Service: General;  Laterality: Right;   PILONIDAL CYST EXCISION  1972   SKIN CANCER REMOVAL      TONSILLECTOMY     UPPER GI ENDOSCOPY     VENTRAL HERNIA REPAIR      SOCIAL HISTORY: Social History   Socioeconomic History   Marital status: Married    Spouse name: Not on file   Number of children: 2   Years of education: 18   Highest education level: Not on file  Occupational History   Not on file  Tobacco Use   Smoking status: Never   Smokeless tobacco: Never  Vaping Use   Vaping status: Never Used  Substance and Sexual Activity   Alcohol  use: No   Drug use: No   Sexual activity: Yes  Other Topics Concern   Not on file  Social History Narrative   Live with husband    Retired    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  Food Insecurity: No Food Insecurity (03/25/2023)   Hunger Vital Sign    Worried About Running Out of Food in the Last Year: Never true    Ran Out of Food in the Last Year: Never true  Transportation Needs: No Transportation  Needs (03/25/2023)   PRAPARE - Administrator, Civil Service (Medical): No    Lack of Transportation (Non-Medical): No  Physical Activity: Not on file  Stress: Not on file  Social Connections: Not on file  Intimate Partner Violence: Not At Risk (03/25/2023)   Humiliation, Afraid, Rape, and Kick questionnaire    Fear of Current or Ex-Partner: No    Emotionally Abused: No    Physically Abused: No    Sexually Abused: No    FAMILY HISTORY: Family History  Problem Relation Age of Onset   Ovarian cancer Mother    Heart disease Mother    Cancer Mother    Uterine cancer Mother    Hypertension Mother    Breast cancer Sister    Stroke Paternal Uncle    Congestive Heart Failure Father    Gout Maternal Aunt    Colon cancer Neg Hx    Esophageal cancer Neg Hx    Stomach cancer Neg Hx    Rectal cancer Neg Hx     Review of Systems  Constitutional:  Negative for appetite change, chills, fatigue, fever and unexpected weight change.  HENT:   Negative for hearing loss, lump/mass and trouble swallowing.   Eyes:  Negative for eye problems and icterus.  Respiratory:  Negative for chest tightness, cough and shortness of breath.   Cardiovascular:  Negative for chest pain, leg swelling and palpitations.  Gastrointestinal:  Negative for abdominal distention, abdominal pain, constipation, diarrhea, nausea and vomiting.  Endocrine: Negative for hot flashes.  Genitourinary:  Negative for difficulty urinating.   Musculoskeletal:  Negative for arthralgias.  Skin:  Negative for itching and rash.  Neurological:  Negative for dizziness, extremity weakness, headaches and numbness.  Hematological:  Negative for adenopathy. Does not bruise/bleed easily.  Psychiatric/Behavioral:  Negative for depression. The patient is not nervous/anxious.   All other systems reviewed and are negative.     PHYSICAL EXAMINATION    Vitals:   09/05/23 1514  BP: 105/70  Pulse: 64  Resp: 17  Temp: 98.4 F  (36.9 C)  SpO2: 96%   Alert, oriented no acute distress CTA bilaterally RRR No LE edema  LABORATORY DATA:  CBC    Component Value Date/Time   WBC 7.1 09/05/2023 1443   RBC 3.80 (L) 09/05/2023 1443   HGB 11.7 (L) 09/05/2023 1443   HGB 11.3 04/20/2023 1445   HCT 36.0 09/05/2023 1443   HCT 34.8 04/20/2023 1445   PLT 34 (L) 09/05/2023 1443   PLT 81 (LL) 04/20/2023 1445   MCV 94.7 09/05/2023 1443   MCV 97 04/20/2023 1445   MCH 30.8 09/05/2023 1443   MCHC 32.5 09/05/2023 1443   RDW 13.2 09/05/2023 1443   RDW 13.8 04/20/2023 1445   LYMPHSABS 1.9 09/05/2023 1443   MONOABS 0.9 09/05/2023 1443   EOSABS 0.1 09/05/2023 1443   BASOSABS 0.0 09/05/2023 1443    CMP     Component Value Date/Time   NA 140 04/20/2023 1445   K 4.8 04/20/2023 1445   CL 100 04/20/2023 1445   CO2 23 04/20/2023 1445   GLUCOSE 99 04/20/2023 1445   GLUCOSE 97 04/18/2023 1910   BUN 23 04/20/2023 1445   CREATININE 1.45 (H) 04/20/2023 1445   CREATININE 1.24 (H) 03/09/2023 1204   CREATININE 1.27 (H) 01/18/2023 1219   CALCIUM  9.8 04/20/2023 1445   PROT 7.2 04/18/2023 1910   ALBUMIN 3.3 (L) 04/18/2023 1910   AST 25 04/18/2023 1910   AST 16 03/09/2023 1204   ALT 28 04/18/2023 1910   ALT 13 03/09/2023 1204   ALKPHOS 55 04/18/2023 1910   BILITOT 0.5 04/18/2023 1910   BILITOT 0.6 03/09/2023 1204   GFRNONAA 53 (L) 04/18/2023 1910   GFRNONAA 44 (L) 03/09/2023 1204   GFRNONAA 45 (L) 10/20/2020 1542   GFRAA 52 (L) 10/20/2020 1542    ASSESSMENT and THERAPY PLAN:   Assessment and Plan Assessment & Plan    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 Previous IVIG effective but short-lived, steroids ineffective. Rituximab considered but delayed efficacy and potential allergic reactions. Promacta (eltrombopag) option requires long-term use, discussed side effects - Await cardiologist's response on Eliquis  necessity. - If Eliquis  necessary, initiate Promacta at low  dose. - Advise immediate hospital visit if bleeding signs occur.  Use of anticoagulant therapy Currently on Eliquis  for cardiovascular indications. Low platelet count complicates anticoagulation, increasing bleeding risk. Cardiologist's input needed to determine Eliquis  discontinuation. If necessary, maintain platelet count above 50,000 to reduce bleeding risk. - Consult cardiologist Dr. Alda Amas about discontinuing Eliquis . -  If Eliquis  necessary, manage ITP to maintain platelet count above 50,000.  Osteoporosis Osteoporosis with recommendation for calcium  supplementation. Specialist advised calcium  intake and administering medication to prevent fractures. - Continue calcium  supplementation as advised. - Adhere to specialist's osteoporosis management recommendations.  She will RTC in 4 weeks for follow up.  Addendum : Discussed with Dr Alda Amas, she will need anti coagulation for the watchman procedure, so we discussed and decided to start promacta 25 mg po daily, prescription sent to [facility] pharmacy. Patient was once again encouraged to go to the nearest ED with any signs of bleeding. She acknowledged.  *Total Encounter Time as defined by the Centers for Medicare and Medicaid Services includes, in addition to the face-to-face time of a patient visit (documented in the note above) non-face-to-face time: obtaining and reviewing outside history, ordering and reviewing medications, tests or procedures, care coordination (communications with other health care professionals or caregivers) and documentation in the medical record.

## 2023-09-06 ENCOUNTER — Encounter: Payer: Self-pay | Admitting: Hematology and Oncology

## 2023-09-06 MED ORDER — ELTROMBOPAG OLAMINE 25 MG PO TABS: 25.0000 mg | ORAL_TABLET | Freq: Every day | ORAL | 1 refills | Status: DC

## 2023-09-06 MED ORDER — ELTROMBOPAG OLAMINE 25 MG PO TABS
25.0000 mg | ORAL_TABLET | Freq: Every day | ORAL | 1 refills | Status: DC
  Filled 2023-09-07: qty 30, 30d supply, fill #0

## 2023-09-06 NOTE — Addendum Note (Signed)
 Addended by: Jahron Hunsinger, NAGA VENKATA KALIPRAVEENA on: 09/06/2023 04:48 PM   Modules accepted: Orders

## 2023-09-07 ENCOUNTER — Other Ambulatory Visit: Payer: Self-pay

## 2023-09-07 ENCOUNTER — Telehealth: Payer: Self-pay

## 2023-09-07 ENCOUNTER — Other Ambulatory Visit (HOSPITAL_COMMUNITY): Payer: Self-pay

## 2023-09-07 NOTE — Telephone Encounter (Signed)
 Oral Oncology Patient Advocate Encounter  New authorization   Received notification that prior authorization for Promacta is required.  (Promacta has now gone generic; after running a test claim, patient's insurance prefers brand Promacta with a [id number]. Prior Auth submitted for brand Promacta.)   PA submitted on 09/07/23  Key [id number]  Status is pending     Hansel Ley, CPhT Pharmacy Technician Coordinator [facility] [phone] (Ph) 09/07/2023 8:18 AM

## 2023-09-07 NOTE — Telephone Encounter (Signed)
 Oral Oncology Patient Advocate Encounter  Prior Authorization for Promacta has been approved.    PA# [id number]  Effective dates: 09/07/23 through 03/05/24  Patients co-pay is $100.00.    Hansel Ley, CPhT Pharmacy Technician Coordinator [facility] [phone] (Ph) 09/07/2023 8:20 AM

## 2023-09-07 NOTE — Progress Notes (Unsigned)
 Specialty Pharmacy Initial Fill Coordination Note  Jillian Rodgers is a 78 y.o. female contacted today regarding initial fill of specialty medication(s) Eltrombopag Olamine (PROMACTA)  Patient requested Delivery   Delivery date: 09/12/23   Verified address: [address] [place], [place] [address]  Medication will be filled on 09/11/23.   Patient is aware of $100.00 copayment. CC info sent to shipping.    Hansel Ley, CPhT Pharmacy Technician Coordinator [facility] [phone] (Ph) 09/07/2023 11:21 AM

## 2023-09-07 NOTE — Telephone Encounter (Signed)
 Patient successfully OnBoarded. Medication scheduled to be shipped on Tuesday, 09/11/23, for delivery on Wednesday, 09/12/23, from [facility] to patient's address. Patient also knows to call me at [phone] with any questions or concerns regarding receiving medication or if there is any unexpected change in co-pay.    Hansel Ley, CPhT Pharmacy Technician Coordinator [facility] [phone] (Ph) 09/07/2023 2:20 PM

## 2023-09-11 ENCOUNTER — Other Ambulatory Visit: Payer: Self-pay

## 2023-09-11 ENCOUNTER — Encounter: Payer: Self-pay | Admitting: Pharmacist

## 2023-09-12 ENCOUNTER — Encounter: Payer: Self-pay | Admitting: Hematology and Oncology

## 2023-09-12 ENCOUNTER — Telehealth: Payer: Self-pay | Admitting: Pharmacist

## 2023-09-12 DIAGNOSIS — D696 Thrombocytopenia, unspecified: Secondary | ICD-10-CM

## 2023-09-12 NOTE — Progress Notes (Signed)
 Patient counseled in clinic telephone visit note on 09/12/23

## 2023-09-12 NOTE — Telephone Encounter (Signed)
 [facility]       Telephone: [phone]?Fax: [phone]   Oncology Clinical Pharmacist Practitioner Initial Assessment   I connected with Jillian Rodgers today by telephone and verified that I was speaking with the correct person using two patient identifiers. I discussed the limitations, risks, security and privacy concerns of performing an evaluation and management service by telemedicine and the availability of in-person appointments. The patient/caregiver expressed understanding and agreed to proceed.  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   Counseled patient on administration, dosing, side effects, monitoring, drug-food interactions, safe handling, storage, and disposal.  Patient will take Promacta  25 mg tablets, 1 tablet by mouth once daily on an empty stomach, 1 hour before or 2 hours after a meal. She knows to also space out her calcium  supplement which she takes in the evening.  Patient counseled to administer Promacta  at least 2 hours before, or 4 hours after antacids, foods high in calcium , or vitamin supplements (iron, calcium , aluminum, magnesium , selenium, zinc).  Promacta  start date: 09/13/23  Adverse effects include but are not limited to: fatigue, diarrhea, nausea, hepatotoxicity, myalgias, fever.    Reviewed with patient importance of keeping a medication schedule and plan for any missed doses. No barriers to medication adherence identified.  Medication reconciliation performed and medication/allergy  list updated.  Patient informed the pharmacy will reach out 5-7 days prior to needing next fill of Promacta  to coordinate continued medication acquisition to prevent break in therapy.  Medication  education handout placed in mail for patient. Patient knows to call the clinic with questions or concerns.  Labs weekly until next visit with Dr. Arno Bibles on 10/04/23 will be ordered  Jillian Rodgers participated in the discussion, expressed understanding, and voiced agreement with the above plan. All questions were answered to their satisfaction. The patient was advised to contact the clinic at [phone] with any questions or concerns prior to their return visit.   I spent 30 minutes assessing the patient.  Lesley Atkin A. Webb Hake, PharmD, BCOP, CPP  Althea Atkinson, RPH-CPP, 09/12/2023 11:47 AM  **Disclaimer: This note was dictated with voice recognition software. Similar sounding words can inadvertently be transcribed and this note may contain transcription errors which may not have been corrected upon publication of note.**

## 2023-09-20 ENCOUNTER — Ambulatory Visit: Payer: Self-pay | Admitting: Hematology and Oncology

## 2023-09-20 ENCOUNTER — Inpatient Hospital Stay: Attending: Hematology and Oncology

## 2023-09-20 DIAGNOSIS — D696 Thrombocytopenia, unspecified: Secondary | ICD-10-CM

## 2023-09-20 DIAGNOSIS — M810 Age-related osteoporosis without current pathological fracture: Secondary | ICD-10-CM | POA: Diagnosis not present

## 2023-09-20 DIAGNOSIS — D693 Immune thrombocytopenic purpura: Secondary | ICD-10-CM | POA: Insufficient documentation

## 2023-09-20 LAB — CBC WITH DIFFERENTIAL/PLATELET
Abs Immature Granulocytes: 0.04 10*3/uL (ref 0.00–0.07)
Basophils Absolute: 0 10*3/uL (ref 0.0–0.1)
Basophils Relative: 1 %
Eosinophils Absolute: 0.1 10*3/uL (ref 0.0–0.5)
Eosinophils Relative: 2 %
HCT: 34.8 % — ABNORMAL LOW (ref 36.0–46.0)
Hemoglobin: 11.4 g/dL — ABNORMAL LOW (ref 12.0–15.0)
Immature Granulocytes: 1 %
Lymphocytes Relative: 29 %
Lymphs Abs: 1.7 10*3/uL (ref 0.7–4.0)
MCH: 31.1 pg (ref 26.0–34.0)
MCHC: 32.8 g/dL (ref 30.0–36.0)
MCV: 95.1 fL (ref 80.0–100.0)
Monocytes Absolute: 0.8 10*3/uL (ref 0.1–1.0)
Monocytes Relative: 14 %
Neutro Abs: 3.2 10*3/uL (ref 1.7–7.7)
Neutrophils Relative %: 53 %
Platelets: 64 10*3/uL — ABNORMAL LOW (ref 150–400)
RBC: 3.66 MIL/uL — ABNORMAL LOW (ref 3.87–5.11)
RDW: 12.9 % (ref 11.5–15.5)
WBC: 5.9 10*3/uL (ref 4.0–10.5)
nRBC: 0 % (ref 0.0–0.2)

## 2023-09-20 LAB — CMP (CANCER CENTER ONLY)
ALT: 11 U/L (ref 0–44)
AST: 16 U/L (ref 15–41)
Albumin: 3.7 g/dL (ref 3.5–5.0)
Alkaline Phosphatase: 56 U/L (ref 38–126)
Anion gap: 3 — ABNORMAL LOW (ref 5–15)
BUN: 31 mg/dL — ABNORMAL HIGH (ref 8–23)
CO2: 31 mmol/L (ref 22–32)
Calcium: 9.5 mg/dL (ref 8.9–10.3)
Chloride: 108 mmol/L (ref 98–111)
Creatinine: 1.47 mg/dL — ABNORMAL HIGH (ref 0.44–1.00)
GFR, Estimated: 36 mL/min — ABNORMAL LOW (ref >60)
Glucose, Bld: 69 mg/dL — ABNORMAL LOW (ref 70–99)
Potassium: 4.6 mmol/L (ref 3.5–5.1)
Sodium: 142 mmol/L (ref 135–145)
Total Bilirubin: 0.5 mg/dL (ref 0.0–1.2)
Total Protein: 6.5 g/dL (ref 6.5–8.1)

## 2023-09-20 NOTE — Telephone Encounter (Addendum)
 Pt called reviewed md recommendation----- Message from [facility] [place] sent at 09/20/2023  3:32 PM EDT ----- Platelets have improved. She appears dehydrated. Please encourage hydration

## 2023-09-27 ENCOUNTER — Inpatient Hospital Stay

## 2023-09-28 ENCOUNTER — Inpatient Hospital Stay

## 2023-09-28 DIAGNOSIS — D696 Thrombocytopenia, unspecified: Secondary | ICD-10-CM

## 2023-09-28 DIAGNOSIS — D693 Immune thrombocytopenic purpura: Secondary | ICD-10-CM

## 2023-09-28 LAB — CBC WITH DIFFERENTIAL/PLATELET
Abs Immature Granulocytes: 0.03 10*3/uL (ref 0.00–0.07)
Basophils Absolute: 0 10*3/uL (ref 0.0–0.1)
Basophils Relative: 1 %
Eosinophils Absolute: 0.1 10*3/uL (ref 0.0–0.5)
Eosinophils Relative: 1 %
HCT: 37.1 % (ref 36.0–46.0)
Hemoglobin: 12.1 g/dL (ref 12.0–15.0)
Immature Granulocytes: 1 %
Lymphocytes Relative: 21 %
Lymphs Abs: 1.3 10*3/uL (ref 0.7–4.0)
MCH: 30.7 pg (ref 26.0–34.0)
MCHC: 32.6 g/dL (ref 30.0–36.0)
MCV: 94.2 fL (ref 80.0–100.0)
Monocytes Absolute: 0.9 10*3/uL (ref 0.1–1.0)
Monocytes Relative: 15 %
Neutro Abs: 3.9 10*3/uL (ref 1.7–7.7)
Neutrophils Relative %: 61 %
Platelets: 92 10*3/uL — ABNORMAL LOW (ref 150–400)
RBC: 3.94 MIL/uL (ref 3.87–5.11)
RDW: 12.6 % (ref 11.5–15.5)
WBC: 6.3 10*3/uL (ref 4.0–10.5)
nRBC: 0 % (ref 0.0–0.2)

## 2023-09-28 LAB — CMP (CANCER CENTER ONLY)
ALT: 14 U/L (ref 0–44)
AST: 17 U/L (ref 15–41)
Albumin: 3.8 g/dL (ref 3.5–5.0)
Alkaline Phosphatase: 57 U/L (ref 38–126)
Anion gap: 4 — ABNORMAL LOW (ref 5–15)
BUN: 28 mg/dL — ABNORMAL HIGH (ref 8–23)
CO2: 29 mmol/L (ref 22–32)
Calcium: 9.3 mg/dL (ref 8.9–10.3)
Chloride: 107 mmol/L (ref 98–111)
Creatinine: 1.33 mg/dL — ABNORMAL HIGH (ref 0.44–1.00)
GFR, Estimated: 41 mL/min — ABNORMAL LOW (ref >60)
Glucose, Bld: 64 mg/dL — ABNORMAL LOW (ref 70–99)
Potassium: 4.9 mmol/L (ref 3.5–5.1)
Sodium: 140 mmol/L (ref 135–145)
Total Bilirubin: 0.5 mg/dL (ref 0.0–1.2)
Total Protein: 6.5 g/dL (ref 6.5–8.1)

## 2023-10-01 NOTE — Telephone Encounter (Signed)
 Late entry from today at 1000:  Spoke with the patient. Scheduled her for pre- PVI/Watchman CT 10/11/2023. She was grateful for call and agreed with plan.

## 2023-10-02 ENCOUNTER — Telehealth: Payer: Self-pay

## 2023-10-02 NOTE — Telephone Encounter (Signed)
 Left message to confirm appts for 6/19

## 2023-10-03 ENCOUNTER — Other Ambulatory Visit: Payer: Self-pay

## 2023-10-04 ENCOUNTER — Inpatient Hospital Stay

## 2023-10-04 ENCOUNTER — Other Ambulatory Visit (HOSPITAL_COMMUNITY): Payer: Self-pay

## 2023-10-04 ENCOUNTER — Inpatient Hospital Stay: Admitting: Hematology and Oncology

## 2023-10-04 ENCOUNTER — Other Ambulatory Visit: Payer: Self-pay

## 2023-10-04 VITALS — BP 119/73 | HR 62 | Temp 98.0°F | Resp 17 | Wt 160.2 lb

## 2023-10-04 DIAGNOSIS — D696 Thrombocytopenia, unspecified: Secondary | ICD-10-CM

## 2023-10-04 DIAGNOSIS — D693 Immune thrombocytopenic purpura: Secondary | ICD-10-CM

## 2023-10-04 LAB — CMP (CANCER CENTER ONLY)
ALT: 12 U/L (ref 0–44)
AST: 17 U/L (ref 15–41)
Albumin: 4.1 g/dL (ref 3.5–5.0)
Alkaline Phosphatase: 54 U/L (ref 38–126)
Anion gap: 4 — ABNORMAL LOW (ref 5–15)
BUN: 31 mg/dL — ABNORMAL HIGH (ref 8–23)
CO2: 32 mmol/L (ref 22–32)
Calcium: 10.3 mg/dL (ref 8.9–10.3)
Chloride: 103 mmol/L (ref 98–111)
Creatinine: 1.55 mg/dL — ABNORMAL HIGH (ref 0.44–1.00)
GFR, Estimated: 34 mL/min — ABNORMAL LOW (ref >60)
Glucose, Bld: 58 mg/dL — ABNORMAL LOW (ref 70–99)
Potassium: 5.2 mmol/L — ABNORMAL HIGH (ref 3.5–5.1)
Sodium: 139 mmol/L (ref 135–145)
Total Bilirubin: 0.6 mg/dL (ref 0.0–1.2)
Total Protein: 6.9 g/dL (ref 6.5–8.1)

## 2023-10-04 LAB — CBC WITH DIFFERENTIAL/PLATELET
Abs Immature Granulocytes: 0.05 10*3/uL (ref 0.00–0.07)
Basophils Absolute: 0 10*3/uL (ref 0.0–0.1)
Basophils Relative: 0 %
Eosinophils Absolute: 0.1 10*3/uL (ref 0.0–0.5)
Eosinophils Relative: 1 %
HCT: 35.9 % — ABNORMAL LOW (ref 36.0–46.0)
Hemoglobin: 11.8 g/dL — ABNORMAL LOW (ref 12.0–15.0)
Immature Granulocytes: 1 %
Lymphocytes Relative: 18 %
Lymphs Abs: 1.2 10*3/uL (ref 0.7–4.0)
MCH: 30.6 pg (ref 26.0–34.0)
MCHC: 32.9 g/dL (ref 30.0–36.0)
MCV: 93 fL (ref 80.0–100.0)
Monocytes Absolute: 1 10*3/uL (ref 0.1–1.0)
Monocytes Relative: 14 %
Neutro Abs: 4.6 10*3/uL (ref 1.7–7.7)
Neutrophils Relative %: 66 %
Platelets: 95 10*3/uL — ABNORMAL LOW (ref 150–400)
RBC: 3.86 MIL/uL — ABNORMAL LOW (ref 3.87–5.11)
RDW: 12.4 % (ref 11.5–15.5)
WBC: 7 10*3/uL (ref 4.0–10.5)
nRBC: 0 % (ref 0.0–0.2)

## 2023-10-04 MED ORDER — ELTROMBOPAG OLAMINE 25 MG PO TABS
25.0000 mg | ORAL_TABLET | Freq: Every day | ORAL | 3 refills | Status: DC
  Filled 2023-10-04 – 2023-10-05 (×2): qty 30, 30d supply, fill #0
  Filled 2023-10-26: qty 30, 30d supply, fill #1
  Filled 2023-11-20 – 2023-12-11 (×3): qty 30, 30d supply, fill #2
  Filled 2023-12-28: qty 30, 30d supply, fill #3

## 2023-10-04 NOTE — Progress Notes (Signed)
 [facility] Cancer Follow up:    Jillian Batters, MD [address] [place] [address] [place] [address]   DIAGNOSIS: Thrombocytopenia  SUMMARY OF HEMATOLOGIC HISTORY:  Underwent thrombocytopenia lab evaluation inn November 2021 without no evidence of hepatitis, hypothyroidism, nutritional deficiencies.  MR of the abdomen from May 2021 shows no evidence of liver disease or splenomegaly.  Bone marrow biopsy from 03/19/2020 showed hypercellularity, no evidence of dyspoeisis, megakaryocytic hyperplasia. Flow cytometry negative for any monoclonal B cell population. Cytogenetics were normal.  Bone marrow biopsy 02/13/2023, overall normocellular marrow with orderly trilineage hematopoiesis and megakaryocytic hyperplasia. 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 Cytogenetics showed normal female karyotype.  Steroids 02/22/2023 which seemed to improve the platelet count but led to a slump in energy levels after discontinuation-minimal improvement in platelet count IVIG 03/19/2023 and 03/22/2023  CURRENT THERAPY: Observation  INTERVAL HISTORY  Discussed the use of AI scribe software for clinical note transcription with the patient, who gave verbal consent to proceed. History of Present Illness  Patient is here for a follow up. She has been taking promacta  as recommended. She denies any new bleeding complaints.  She is going to [facility] tomorrow for her son's wedding and she is excited.  She is also scheduled for cardiac surgery in July.  Rest of the pertinent 10 point ROS reviewed and neg.  Patient Active Problem List   Diagnosis Date Noted   Hypokalemia 01/11/2023   Acute cystitis without hematuria 01/10/2023   History of TIA (transient ischemic attack) 01/10/2023   Atrial fibrillation with RVR (HCC) 01/09/2023   TIA (transient ischemic attack) 10/17/2022   Primary hypertension 04/28/2022    Snoring 03/04/2021   Paroxysmal atrial fibrillation (HCC) 11/01/2020   Secondary hypercoagulable state (HCC) 11/01/2020   Increased urinary frequency 10/20/2020   Localized osteoporosis without current pathological fracture 10/20/2020   IDA (iron deficiency anemia) 04/07/2020   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  MEDICAL HISTORY: Past Medical History:  Diagnosis Date   Anemia with low platelet count (HCC)    Chronic kidney disease    Degenerative joint disease    Dysrhythmia    a fib   GERD (gastroesophageal reflux disease)    History of kidney stones    Hyperlipidemia    Hypothyroidism    Inguinal hernia 02/19/2019   Labile hypertension    Migraines    neurontin  helps   Pneumonia    hx of walking pneumonia    Primary hypertension 04/28/2022    SURGICAL HISTORY: Past Surgical History:  Procedure Laterality Date   BILATERAL CATARACT SURGERY      BLERPHOROPLASTY \     CHOLECYSTECTOMY  1996   open   COLONOSCOPY  2009   recommended 10 year f/u   HERNIA REPAIR  1985   umb hernia rpr   HERNIA REPAIR  2005 & 2009   ventral hernia rpr   INGUINAL HERNIA REPAIR Left 04/30/2020   Procedure: OPEN LEFT INGUINAL HERNIA REPAIR WITH MESH;  Surgeon: Adalberto Acton, MD;  Location: [facility];  Service: General;  Laterality: Left;   INGUINAL HERNIA REPAIR Right 10/24/2021   Procedure: OPEN RIGHT INGUINAL HERNIA REPAIR WITH MESH ;  Surgeon: Adalberto Acton, MD;  Location: [facility];  Service: General;  Laterality: Right;   PILONIDAL CYST EXCISION   1972   SKIN CANCER REMOVAL      TONSILLECTOMY     UPPER GI ENDOSCOPY     VENTRAL HERNIA REPAIR      SOCIAL HISTORY: Social History   Socioeconomic History   Marital status: Married    Spouse name: Not on file   Number of children: 2   Years of education: 18   Highest education level: Not on file  Occupational History   Not on file  Tobacco Use   Smoking status: Never   Smokeless tobacco: Never  Vaping Use   Vaping status: Never Used  Substance and Sexual Activity   Alcohol  use: No   Drug use: No   Sexual activity: Yes  Other Topics Concern   Not on file  Social History Narrative   Live with husband    Retired    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  Food Insecurity: No Food Insecurity (03/25/2023)   Hunger Vital Sign    Worried About Running Out of Food in the Last Year: Never true    Ran Out of Food in the Last Year: Never true  Transportation Needs: No Transportation Needs (03/25/2023)   PRAPARE - Administrator, Civil Service (Medical): No    Lack of Transportation (Non-Medical): No  Physical Activity: Not on file  Stress: Not on file  Social Connections: Not on file  Intimate Partner Violence: Not At Risk (03/25/2023)   Humiliation, Afraid, Rape, and Kick questionnaire    Fear of Current or Ex-Partner: No    Emotionally Abused: No    Physically Abused: No    Sexually Abused: No    FAMILY HISTORY: Family History  Problem Relation Age of Onset   Ovarian cancer Mother    Heart disease Mother    Cancer Mother    Uterine cancer Mother    Hypertension Mother    Breast cancer Sister    Stroke Paternal Uncle    Congestive Heart Failure Father    Gout Maternal Aunt    Colon cancer Neg Hx    Esophageal cancer Neg Hx    Stomach cancer Neg Hx    Rectal cancer Neg Hx     Review of Systems  Constitutional:  Negative for appetite change, chills, fatigue, fever and unexpected weight change.  HENT:   Negative for  hearing loss, lump/mass and trouble swallowing.   Eyes:  Negative for eye problems and icterus.  Respiratory:  Negative for chest tightness, cough and shortness of breath.   Cardiovascular:  Negative for chest pain, leg swelling and palpitations.  Gastrointestinal:  Negative for abdominal distention, abdominal pain, constipation, diarrhea, nausea and vomiting.  Endocrine: Negative for hot flashes.  Genitourinary:  Negative for difficulty urinating.   Musculoskeletal:  Negative for arthralgias.  Skin:  Negative for itching and rash.  Neurological:  Negative for dizziness, extremity weakness, headaches and numbness.  Hematological:  Negative for adenopathy. Does not bruise/bleed easily.  Psychiatric/Behavioral:  Negative for depression. The patient is not nervous/anxious.   All other systems reviewed and are negative.     PHYSICAL EXAMINATION    Vitals:   10/04/23  1107  BP: 119/73  Pulse: 62  Resp: 17  Temp: 98 F (36.7 C)  SpO2: 97%   Alert, oriented no acute distress CTA bilaterally RRR No LE edema  LABORATORY DATA:  CBC    Component Value Date/Time   WBC 7.0 10/04/2023 1100   RBC 3.86 (L) 10/04/2023 1100   HGB 11.8 (L) 10/04/2023 1100   HGB 11.3 04/20/2023 1445   HCT 35.9 (L) 10/04/2023 1100   HCT 34.8 04/20/2023 1445   PLT 95 (L) 10/04/2023 1100   PLT 81 (LL) 04/20/2023 1445   MCV 93.0 10/04/2023 1100   MCV 97 04/20/2023 1445   MCH 30.6 10/04/2023 1100   MCHC 32.9 10/04/2023 1100   RDW 12.4 10/04/2023 1100   RDW 13.8 04/20/2023 1445   LYMPHSABS 1.2 10/04/2023 1100   MONOABS 1.0 10/04/2023 1100   EOSABS 0.1 10/04/2023 1100   BASOSABS 0.0 10/04/2023 1100    CMP     Component Value Date/Time   NA 139 10/04/2023 1100   NA 140 04/20/2023 1445   K 5.2 (H) 10/04/2023 1100   CL 103 10/04/2023 1100   CO2 32 10/04/2023 1100   GLUCOSE 58 (L) 10/04/2023 1100   BUN 31 (H) 10/04/2023 1100   BUN 23 04/20/2023 1445   CREATININE 1.55 (H) 10/04/2023 1100    CREATININE 1.27 (H) 01/18/2023 1219   CALCIUM  10.3 10/04/2023 1100   PROT 6.9 10/04/2023 1100   ALBUMIN 4.1 10/04/2023 1100   AST 17 10/04/2023 1100   ALT 12 10/04/2023 1100   ALKPHOS 54 10/04/2023 1100   BILITOT 0.6 10/04/2023 1100   GFRNONAA 34 (L) 10/04/2023 1100   GFRNONAA 45 (L) 10/20/2020 1542   GFRAA 52 (L) 10/20/2020 1542    ASSESSMENT and THERAPY PLAN:   Assessment and Plan Assessment & Plan    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  - She will continue on Promacta  25 mg daily.  Okay to proceed with cardiac surgery as scheduled.  She will return to clinic in 3 months or sooner as needed.  Use of anticoagulant therapy Currently on Eliquis  for cardiovascular indications. Ok to proceed with watchman procedure as scheduled.  Osteoporosis Osteoporosis with recommendation for calcium  supplementation. Specialist advised calcium  intake and administering medication to prevent fractures. - Continue calcium  supplementation as advised. - Adhere to specialist's osteoporosis management recommendations.     She will RTC in 3 months for follow up. Time spent: 20 min  *Total Encounter Time as defined by the Centers for Medicare and Medicaid Services includes, in addition to the face-to-face time of a patient visit (documented in the note above) non-face-to-face time: obtaining and reviewing outside history, ordering and reviewing medications, tests or procedures, care coordination (communications with other health care professionals or caregivers) and documentation in the medical record.

## 2023-10-05 ENCOUNTER — Other Ambulatory Visit (HOSPITAL_COMMUNITY): Payer: Self-pay

## 2023-10-05 ENCOUNTER — Other Ambulatory Visit: Payer: Self-pay

## 2023-10-08 ENCOUNTER — Other Ambulatory Visit: Payer: Self-pay

## 2023-10-08 NOTE — Progress Notes (Signed)
 Specialty Pharmacy Ongoing Clinical Assessment Note  Jillian Rodgers is a 78 y.o. female who is being followed by the specialty pharmacy service for RxSp Oncology   Patient's specialty medication(s) reviewed today: Eltrombopag  Olamine (PROMACTA )   Missed doses in the last 4 weeks: 0   Patient/Caregiver did not have any additional questions or concerns.   Therapeutic benefit summary: Patient is achieving benefit   Adverse events/side effects summary: No adverse events/side effects   Patient's therapy is appropriate to: Continue    Goals Addressed             This Visit's Progress    Maintain optimal adherence to therapy   On track    Patient is on track. Patient will maintain adherence         Follow up: 3 months  [facility] Specialty Pharmacist

## 2023-10-08 NOTE — Progress Notes (Signed)
 Specialty Pharmacy Refill Coordination Note  Jillian Rodgers is a 78 y.o. female contacted today regarding refills of specialty medication(s) Eltrombopag  Olamine (PROMACTA )   Patient requested Delivery   Delivery date: 10/09/23   Verified address: [address]  [place] [address]   Medication will be filled on 10/08/23.

## 2023-10-09 ENCOUNTER — Encounter: Payer: Medicare PPO | Admitting: Internal Medicine

## 2023-10-11 ENCOUNTER — Ambulatory Visit (HOSPITAL_COMMUNITY)
Admission: RE | Admit: 2023-10-11 | Discharge: 2023-10-11 | Disposition: A | Discharge status: Home / Self Care | Source: Ambulatory Visit | Attending: Cardiology | Admitting: Cardiology

## 2023-10-11 DIAGNOSIS — D696 Thrombocytopenia, unspecified: Secondary | ICD-10-CM | POA: Insufficient documentation

## 2023-10-11 DIAGNOSIS — I4819 Other persistent atrial fibrillation: Secondary | ICD-10-CM | POA: Insufficient documentation

## 2023-10-11 MED ORDER — IOHEXOL 350 MG/ML SOLN
100.0000 mL | Freq: Once | INTRAVENOUS | Status: AC | PRN
  Administered 2023-10-11: 100 mL via INTRAVENOUS

## 2023-10-12 ENCOUNTER — Other Ambulatory Visit: Payer: Self-pay | Admitting: Family

## 2023-10-19 ENCOUNTER — Other Ambulatory Visit: Payer: Self-pay | Admitting: Family

## 2023-10-26 ENCOUNTER — Ambulatory Visit: Admitting: Podiatry

## 2023-10-26 ENCOUNTER — Other Ambulatory Visit: Payer: Self-pay

## 2023-10-26 NOTE — Progress Notes (Signed)
 Specialty Pharmacy Refill Coordination Note  Jillian Rodgers is a 78 y.o. female contacted today regarding refills of specialty medication(s) Eltrombopag  Olamine (PROMACTA )   Patient requested Delivery   Delivery date: 11/02/23   Verified address: [address]  [place] [address]   Medication will be filled on 11/01/23.

## 2023-10-29 ENCOUNTER — Other Ambulatory Visit: Payer: Self-pay

## 2023-10-29 ENCOUNTER — Telehealth: Payer: Self-pay

## 2023-10-29 DIAGNOSIS — D696 Thrombocytopenia, unspecified: Secondary | ICD-10-CM

## 2023-10-29 DIAGNOSIS — I4819 Other persistent atrial fibrillation: Secondary | ICD-10-CM

## 2023-10-29 NOTE — Telephone Encounter (Signed)
 Confirmed procedure date of 11/01/2023. Confirmed arrival time of 1100 for procedure time at 1330. Reviewed pre-procedure instructions with patient. Confirmed she does not have a contrast allergy . Confirmed she does not have a PPM or ICD. Confirmed with the patient that Dr. Loretha is happy with her lab results and she did not want to repeat prior to the procedures. The patient understands to call if questions/concerns arise prior to procedures. Instruction letter emailed to patient per request.

## 2023-10-29 NOTE — Telephone Encounter (Signed)
 Heron Rattler: Curvy appendage with high take-off Max 23/ AVG 21/ Depth 15 Likely use a 27mm device and TruSteer sheath recommended. Low and Ant TSP  Steep angle: RAO 30 CAU 40

## 2023-10-30 ENCOUNTER — Ambulatory Visit: Admitting: Podiatry

## 2023-10-31 ENCOUNTER — Other Ambulatory Visit: Payer: Self-pay

## 2023-10-31 ENCOUNTER — Encounter: Payer: Self-pay | Admitting: Internal Medicine

## 2023-10-31 DIAGNOSIS — I4819 Other persistent atrial fibrillation: Secondary | ICD-10-CM

## 2023-10-31 DIAGNOSIS — D696 Thrombocytopenia, unspecified: Secondary | ICD-10-CM

## 2023-11-01 ENCOUNTER — Inpatient Hospital Stay (HOSPITAL_COMMUNITY): Payer: Self-pay | Admitting: Anesthesiology

## 2023-11-01 ENCOUNTER — Inpatient Hospital Stay (HOSPITAL_COMMUNITY)
Admission: RE | Admit: 2023-11-01 | Discharge: 2023-11-02 | DRG: 317 | Disposition: A | Discharge status: Home / Self Care | Attending: Cardiology | Admitting: Cardiology

## 2023-11-01 ENCOUNTER — Ambulatory Visit: Payer: Self-pay

## 2023-11-01 ENCOUNTER — Encounter (HOSPITAL_COMMUNITY): Payer: Self-pay | Admitting: Cardiology

## 2023-11-01 ENCOUNTER — Inpatient Hospital Stay (HOSPITAL_COMMUNITY)
Admission: RE | Disposition: A | Discharge status: Home / Self Care | Payer: Self-pay | Source: Home / Self Care | Attending: Cardiology

## 2023-11-01 ENCOUNTER — Ambulatory Visit (HOSPITAL_COMMUNITY)

## 2023-11-01 DIAGNOSIS — I4891 Unspecified atrial fibrillation: Secondary | ICD-10-CM

## 2023-11-01 DIAGNOSIS — D696 Thrombocytopenia, unspecified: Secondary | ICD-10-CM

## 2023-11-01 DIAGNOSIS — E039 Hypothyroidism, unspecified: Secondary | ICD-10-CM | POA: Diagnosis present

## 2023-11-01 DIAGNOSIS — I4819 Other persistent atrial fibrillation: Secondary | ICD-10-CM | POA: Diagnosis present

## 2023-11-01 DIAGNOSIS — Z006 Encounter for examination for normal comparison and control in clinical research program: Secondary | ICD-10-CM | POA: Diagnosis not present

## 2023-11-01 DIAGNOSIS — D693 Immune thrombocytopenic purpura: Secondary | ICD-10-CM | POA: Diagnosis present

## 2023-11-01 DIAGNOSIS — E785 Hyperlipidemia, unspecified: Secondary | ICD-10-CM | POA: Diagnosis present

## 2023-11-01 DIAGNOSIS — Z79899 Other long term (current) drug therapy: Secondary | ICD-10-CM

## 2023-11-01 DIAGNOSIS — N1831 Chronic kidney disease, stage 3a: Secondary | ICD-10-CM | POA: Diagnosis not present

## 2023-11-01 DIAGNOSIS — I10 Essential (primary) hypertension: Secondary | ICD-10-CM | POA: Diagnosis present

## 2023-11-01 DIAGNOSIS — Z8673 Personal history of transient ischemic attack (TIA), and cerebral infarction without residual deficits: Secondary | ICD-10-CM

## 2023-11-01 DIAGNOSIS — I129 Hypertensive chronic kidney disease with stage 1 through stage 4 chronic kidney disease, or unspecified chronic kidney disease: Secondary | ICD-10-CM

## 2023-11-01 DIAGNOSIS — K7460 Unspecified cirrhosis of liver: Secondary | ICD-10-CM | POA: Diagnosis present

## 2023-11-01 DIAGNOSIS — K219 Gastro-esophageal reflux disease without esophagitis: Secondary | ICD-10-CM | POA: Diagnosis present

## 2023-11-01 HISTORY — PX: ATRIAL FIBRILLATION ABLATION: EP1191

## 2023-11-01 HISTORY — PX: TRANSESOPHAGEAL ECHOCARDIOGRAM (CATH LAB): EP1270

## 2023-11-01 HISTORY — PX: LEFT ATRIAL APPENDAGE OCCLUSION: EP1229

## 2023-11-01 LAB — CBC WITH DIFFERENTIAL/PLATELET
Basophils Absolute: 0 x10E3/uL (ref 0.0–0.2)
Basos: 0 %
EOS (ABSOLUTE): 0.1 x10E3/uL (ref 0.0–0.4)
Eos: 2 %
Hematocrit: 37.2 % (ref 34.0–46.6)
Hemoglobin: 11.8 g/dL (ref 11.1–15.9)
Immature Grans (Abs): 0.1 x10E3/uL (ref 0.0–0.1)
Immature Granulocytes: 1 %
Lymphocytes Absolute: 1.5 x10E3/uL (ref 0.7–3.1)
Lymphs: 23 %
MCH: 29.6 pg (ref 26.6–33.0)
MCHC: 31.7 g/dL (ref 31.5–35.7)
MCV: 93 fL (ref 79–97)
Monocytes Absolute: 0.8 x10E3/uL (ref 0.1–0.9)
Monocytes: 12 %
Neutrophils Absolute: 4.2 x10E3/uL (ref 1.4–7.0)
Neutrophils: 62 %
Platelets: 95 x10E3/uL — CL (ref 150–450)
RBC: 3.99 x10E6/uL (ref 3.77–5.28)
RDW: 12.4 % (ref 11.7–15.4)
WBC: 6.8 x10E3/uL (ref 3.4–10.8)

## 2023-11-01 LAB — POCT ACTIVATED CLOTTING TIME
Activated Clotting Time: 302 s
Activated Clotting Time: 412 s

## 2023-11-01 LAB — BASIC METABOLIC PANEL WITH GFR
BUN/Creatinine Ratio: 25 (ref 12–28)
BUN: 33 mg/dL — ABNORMAL HIGH (ref 8–27)
CO2: 18 mmol/L — ABNORMAL LOW (ref 20–29)
Calcium: 9.8 mg/dL (ref 8.7–10.3)
Chloride: 102 mmol/L (ref 96–106)
Creatinine, Ser: 1.33 mg/dL — ABNORMAL HIGH (ref 0.57–1.00)
Glucose: 108 mg/dL — ABNORMAL HIGH (ref 70–99)
Potassium: 4.6 mmol/L (ref 3.5–5.2)
Sodium: 138 mmol/L (ref 134–144)
eGFR: 41 mL/min/1.73 — ABNORMAL LOW (ref >59)

## 2023-11-01 LAB — ECHO TEE

## 2023-11-01 LAB — TYPE AND SCREEN
ABO/RH(D): A POS
Antibody Screen: NEGATIVE

## 2023-11-01 LAB — ABO/RH: ABO/RH(D): A POS

## 2023-11-01 SURGERY — ATRIAL FIBRILLATION ABLATION
Anesthesia: General

## 2023-11-01 MED ORDER — LABETALOL HCL 5 MG/ML IV SOLN
INTRAVENOUS | Status: DC | PRN
  Administered 2023-11-01 (×4): 5 mg via INTRAVENOUS

## 2023-11-01 MED ORDER — FENTANYL CITRATE (PF) 100 MCG/2ML IJ SOLN
INTRAMUSCULAR | Status: AC
  Filled 2023-11-01: qty 2

## 2023-11-01 MED ORDER — DEXAMETHASONE SODIUM PHOSPHATE 10 MG/ML IJ SOLN
INTRAMUSCULAR | Status: DC | PRN
  Administered 2023-11-01: 10 mg via INTRAVENOUS

## 2023-11-01 MED ORDER — HEPARIN (PORCINE) IN NACL 2000-0.9 UNIT/L-% IV SOLN
INTRAVENOUS | Status: DC | PRN
  Administered 2023-11-01: 1000 mL

## 2023-11-01 MED ORDER — MAGNESIUM OXIDE -MG SUPPLEMENT 400 (240 MG) MG PO TABS
400.0000 mg | ORAL_TABLET | Freq: Every day | ORAL | Status: DC
  Administered 2023-11-01: 400 mg via ORAL
  Filled 2023-11-01: qty 1

## 2023-11-01 MED ORDER — PROTAMINE SULFATE 10 MG/ML IV SOLN
INTRAVENOUS | Status: DC | PRN
  Administered 2023-11-01: 35 mg via INTRAVENOUS

## 2023-11-01 MED ORDER — HEPARIN (PORCINE) IN NACL 1000-0.9 UT/500ML-% IV SOLN
INTRAVENOUS | Status: DC | PRN
  Administered 2023-11-01 (×2): 500 mL

## 2023-11-01 MED ORDER — ACETAMINOPHEN 325 MG PO TABS
650.0000 mg | ORAL_TABLET | ORAL | Status: DC | PRN
  Administered 2023-11-01 – 2023-11-02 (×3): 650 mg via ORAL
  Filled 2023-11-01 (×3): qty 2

## 2023-11-01 MED ORDER — COLCHICINE 0.6 MG PO TABS
0.6000 mg | ORAL_TABLET | Freq: Two times a day (BID) | ORAL | Status: DC
  Administered 2023-11-02: 0.6 mg via ORAL
  Filled 2023-11-01: qty 1

## 2023-11-01 MED ORDER — SODIUM CHLORIDE 0.9% FLUSH
3.0000 mL | Freq: Two times a day (BID) | INTRAVENOUS | Status: DC
Start: 2023-11-01 — End: 2023-11-02
  Administered 2023-11-02: 3 mL via INTRAVENOUS

## 2023-11-01 MED ORDER — HEPARIN SODIUM (PORCINE) 1000 UNIT/ML IJ SOLN
INTRAMUSCULAR | Status: DC | PRN
Start: 2023-11-01 — End: 2023-11-01
  Administered 2023-11-01: 11000 [IU] via INTRAVENOUS
  Administered 2023-11-01: 4000 [IU] via INTRAVENOUS

## 2023-11-01 MED ORDER — PANTOPRAZOLE SODIUM 40 MG PO TBEC: 40.0000 mg | DELAYED_RELEASE_TABLET | Freq: Every day | ORAL | Status: DC

## 2023-11-01 MED ORDER — SODIUM CHLORIDE 0.9 % IV SOLN: 250.0000 mL | INTRAVENOUS | Status: DC | PRN

## 2023-11-01 MED ORDER — APIXABAN 5 MG PO TABS
5.0000 mg | ORAL_TABLET | Freq: Two times a day (BID) | ORAL | Status: DC
  Administered 2023-11-01 – 2023-11-02 (×2): 5 mg via ORAL
  Filled 2023-11-01 (×2): qty 1

## 2023-11-01 MED ORDER — CEFAZOLIN SODIUM-DEXTROSE 2-4 GM/100ML-% IV SOLN
2.0000 g | INTRAVENOUS | Status: AC
  Administered 2023-11-01: 2 g via INTRAVENOUS
  Filled 2023-11-01: qty 100

## 2023-11-01 MED ORDER — ONDANSETRON HCL 4 MG/2ML IJ SOLN
INTRAMUSCULAR | Status: DC | PRN
  Administered 2023-11-01: 4 mg via INTRAVENOUS

## 2023-11-01 MED ORDER — LEVOTHYROXINE SODIUM 50 MCG PO TABS
50.0000 ug | ORAL_TABLET | ORAL | Status: DC
  Administered 2023-11-02: 50 ug via ORAL
  Filled 2023-11-01: qty 1

## 2023-11-01 MED ORDER — DIVALPROEX SODIUM ER 500 MG PO TB24
500.0000 mg | ORAL_TABLET | Freq: Every day | ORAL | Status: DC
  Administered 2023-11-02: 500 mg via ORAL
  Filled 2023-11-01: qty 1

## 2023-11-01 MED ORDER — FENTANYL CITRATE (PF) 250 MCG/5ML IJ SOLN
INTRAMUSCULAR | Status: DC | PRN
  Administered 2023-11-01 (×2): 50 ug via INTRAVENOUS

## 2023-11-01 MED ORDER — SODIUM CHLORIDE 0.9% FLUSH: 3.0000 mL | INTRAVENOUS | Status: DC | PRN

## 2023-11-01 MED ORDER — PHENYLEPHRINE HCL-NACL 20-0.9 MG/250ML-% IV SOLN
INTRAVENOUS | Status: DC | PRN
  Administered 2023-11-01: 30 ug/min via INTRAVENOUS

## 2023-11-01 MED ORDER — METOPROLOL TARTRATE 25 MG PO TABS
37.5000 mg | ORAL_TABLET | Freq: Two times a day (BID) | ORAL | Status: DC
  Administered 2023-11-01 – 2023-11-02 (×2): 37.5 mg via ORAL
  Filled 2023-11-01 (×2): qty 1

## 2023-11-01 MED ORDER — ATROPINE SULFATE 1 MG/10ML IJ SOSY
PREFILLED_SYRINGE | INTRAMUSCULAR | Status: DC | PRN
  Administered 2023-11-01: 1 mg via INTRAVENOUS

## 2023-11-01 MED ORDER — PROPOFOL 10 MG/ML IV BOLUS
INTRAVENOUS | Status: DC | PRN
  Administered 2023-11-01: 120 mg via INTRAVENOUS
  Administered 2023-11-01: 50 mg via INTRAVENOUS

## 2023-11-01 MED ORDER — SODIUM CHLORIDE 0.9 % IV SOLN: INTRAVENOUS | Status: DC

## 2023-11-01 MED ORDER — IOHEXOL 350 MG/ML SOLN
INTRAVENOUS | Status: DC | PRN
  Administered 2023-11-01 (×2): 10 mL

## 2023-11-01 MED ORDER — DILTIAZEM HCL ER COATED BEADS 180 MG PO CP24
180.0000 mg | ORAL_CAPSULE | Freq: Every day | ORAL | Status: DC
  Administered 2023-11-02: 180 mg via ORAL
  Filled 2023-11-01: qty 1

## 2023-11-01 MED ORDER — HYDRALAZINE HCL 20 MG/ML IJ SOLN
INTRAMUSCULAR | Status: DC | PRN
  Administered 2023-11-01: 10 mg via INTRAVENOUS

## 2023-11-01 MED ORDER — SUGAMMADEX SODIUM 200 MG/2ML IV SOLN
INTRAVENOUS | Status: DC | PRN
  Administered 2023-11-01: 145.2 mg via INTRAVENOUS

## 2023-11-01 MED ORDER — ELTROMBOPAG OLAMINE 25 MG PO TABS: 25.0000 mg | ORAL_TABLET | Freq: Every day | ORAL | Status: DC

## 2023-11-01 MED ORDER — MAGNESIUM OXIDE 400 MG PO CAPS: 400.0000 mg | ORAL_CAPSULE | Freq: Every day | ORAL | Status: DC

## 2023-11-01 MED ORDER — CALCIUM CARBONATE 1500 (600 CA) MG PO TABS: 600.0000 mg | ORAL_TABLET | Freq: Every day | ORAL | Status: DC

## 2023-11-01 MED ORDER — ROCURONIUM BROMIDE 10 MG/ML (PF) SYRINGE
PREFILLED_SYRINGE | INTRAVENOUS | Status: DC | PRN
  Administered 2023-11-01: 60 mg via INTRAVENOUS
  Administered 2023-11-01: 20 mg via INTRAVENOUS

## 2023-11-01 MED ORDER — CHLORHEXIDINE GLUCONATE 0.12 % MT SOLN
OROMUCOSAL | Status: AC
  Administered 2023-11-01: 15 mL
  Filled 2023-11-01: qty 15

## 2023-11-01 MED ORDER — CALCIUM CARBONATE 1250 (500 CA) MG PO TABS
1.0000 | ORAL_TABLET | Freq: Every day | ORAL | Status: DC
  Administered 2023-11-01: 1250 mg via ORAL
  Filled 2023-11-01 (×2): qty 1

## 2023-11-01 MED ORDER — ACETAMINOPHEN 500 MG PO TABS
1000.0000 mg | ORAL_TABLET | Freq: Once | ORAL | Status: AC
  Administered 2023-11-01: 1000 mg via ORAL
  Filled 2023-11-01: qty 2

## 2023-11-01 MED ORDER — ONDANSETRON HCL 4 MG/2ML IJ SOLN: 4.0000 mg | Freq: Four times a day (QID) | INTRAMUSCULAR | Status: DC | PRN

## 2023-11-01 MED ORDER — LACTATED RINGERS IV SOLN: INTRAVENOUS | Status: DC

## 2023-11-01 SURGICAL SUPPLY — 28 items
CABLE FARASTAR GEN2 SNGL USE (CABLE) IMPLANT
CATH FARAWAVE 2.0 31 (CATHETERS) IMPLANT
CATH INFINITI 5FR ANG PIGTAIL (CATHETERS) IMPLANT
CATH NUVISION NON NAV ICE (CATHETERS) IMPLANT
CATH OCTARAY 2.0 F 3-3-3-3-3 (CATHETERS) IMPLANT
CATH WATCHMAN STEER ACCESS SYS (CATHETERS) IMPLANT
CATH WEBSTER BI DIR CS D-F CRV (CATHETERS) IMPLANT
CLOSURE PERCLOSE PROSTYLE (VASCULAR PRODUCTS) IMPLANT
COVER SWIFTLINK CONNECTOR (BAG) ×1 IMPLANT
DEVICE WATCHMAN FLX PRO PROC (KITS) IMPLANT
DEVICE WATCHMAN TRUSTEER PROC (KITS) IMPLANT
DILATOR VESSEL 38 20CM 16FR (INTRODUCER) IMPLANT
INTRODUCER PERFORM 12 30 .038 (SHEATH) IMPLANT
KIT HEART LEFT (KITS) ×1 IMPLANT
KIT VERSACROSS CNCT FARADRIVE (KITS) IMPLANT
PACK CARDIAC CATHETERIZATION (CUSTOM PROCEDURE TRAY) ×1 IMPLANT
PACK EP LF (CUSTOM PROCEDURE TRAY) ×1 IMPLANT
PAD DEFIB RADIO PHYSIO CONN (PAD) ×1 IMPLANT
PATCH CARTO3 (PAD) IMPLANT
SHEATH FARADRIVE STEERABLE (SHEATH) IMPLANT
SHEATH PINNACLE 8F 10CM (SHEATH) IMPLANT
SHEATH PINNACLE 9F 10CM (SHEATH) IMPLANT
SHEATH PROBE COVER 6X72 (BAG) ×1 IMPLANT
TRANSDUCER W/STOPCOCK (MISCELLANEOUS) ×1 IMPLANT
TUBING CIL FLEX 10 FLL-RA (TUBING) ×1 IMPLANT
WATCHMAN FLX PRO 24 (Prosthesis & Implant Heart) IMPLANT
WATCHMAN FLX PRO PROCEDURE (KITS) ×1 IMPLANT
WATCHMAN TRUSTEER PROCEDURE (KITS) ×1 IMPLANT

## 2023-11-01 NOTE — Anesthesia Procedure Notes (Signed)
 Procedure Name: Intubation Date/Time: 11/01/2023 1:36 PM  Performed by: Zelphia Norleen HERO, CRNAPre-anesthesia Checklist: Patient identified, Emergency Drugs available, Suction available, Patient being monitored and Timeout performed Patient Re-evaluated:Patient Re-evaluated prior to induction Oxygen  Delivery Method: Circle system utilized Preoxygenation: Pre-oxygenation with 100% oxygen  Induction Type: IV induction Ventilation: Mask ventilation without difficulty and Oral airway inserted - appropriate to patient size Laryngoscope Size: Mac and 3 Grade View: Grade I Tube type: Oral Tube size: 7.0 mm Number of attempts: 1 Airway Equipment and Method: Stylet and Oral airway Placement Confirmation: ETT inserted through vocal cords under direct vision Secured at: 22 cm Tube secured with: Tape Dental Injury: Teeth and Oropharynx as per pre-operative assessment

## 2023-11-01 NOTE — Transfer of Care (Signed)
 Immediate Anesthesia Transfer of Care Note  Patient: Jillian Rodgers  Procedure(s) Performed: ATRIAL FIBRILLATION ABLATION LEFT ATRIAL APPENDAGE OCCLUSION TRANSESOPHAGEAL ECHOCARDIOGRAM  Patient Location: Cath Lab  Anesthesia Type:General  Level of Consciousness: awake, alert , and oriented  Airway & Oxygen  Therapy: Patient Spontanous Breathing and Patient connected to face mask oxygen   Post-op Assessment: Report given to RN and Post -op Vital signs reviewed and stable  Post vital signs: Reviewed and stable  Last Vitals:  Vitals Value Taken Time  BP 104/71 11/01/23 15:56  Temp 98   Pulse 79 11/01/23 15:58  Resp 14 11/01/23 15:58  SpO2 100 % 11/01/23 15:58  Vitals shown include unfiled device data.  Last Pain:  Vitals:   11/01/23 1158  TempSrc:   PainSc: 0-No pain         Complications: There were no known notable events for this encounter.

## 2023-11-01 NOTE — Anesthesia Preprocedure Evaluation (Addendum)
 Anesthesia Evaluation  Patient identified by MRN, date of birth, ID band Patient awake    Reviewed: Allergy  & Precautions, NPO status , Patient's Chart, lab work & pertinent test results, reviewed documented beta blocker date and time   Airway Mallampati: II  TM Distance: >3 FB Neck ROM: Full    Dental  (+) Teeth Intact, Dental Advisory Given, Caps   Pulmonary neg pulmonary ROS   Pulmonary exam normal breath sounds clear to auscultation       Cardiovascular hypertension, Pt. on medications and Pt. on home beta blockers Normal cardiovascular exam+ dysrhythmias Atrial Fibrillation  Rhythm:Regular Rate:Normal  Echo 01/12/23:  1. Left ventricular ejection fraction, by estimation, is 60 to 65%. The  left ventricle has normal function. The left ventricle has no regional  wall motion abnormalities. There is mild left ventricular hypertrophy.   2. Right ventricular systolic function is normal. The right ventricular  size is normal.   3. Left atrial size was severely dilated.   4. The mitral valve is normal in structure. No evidence of mitral valve  regurgitation. No evidence of mitral stenosis.   5. The aortic valve is tricuspid. Aortic valve regurgitation is not  visualized. No aortic stenosis is present.   6. The inferior vena cava is dilated in size with <50% respiratory  variability, suggesting right atrial pressure of 15 mmHg.     Neuro/Psych  Headaches TIA   GI/Hepatic Neg liver ROS,GERD  Medicated,,  Endo/Other  Hypothyroidism    Renal/GU Renal InsufficiencyRenal disease     Musculoskeletal  (+) Arthritis ,    Abdominal   Peds  Hematology  (+) Blood dyscrasia (Eliquis ; Thrombocytopenia)   Anesthesia Other Findings Day of surgery medications reviewed with the patient.  Reproductive/Obstetrics                              Anesthesia Physical Anesthesia Plan  ASA: 3  Anesthesia Plan:  General   Post-op Pain Management: Tylenol  PO (pre-op)*   Induction: Intravenous  PONV Risk Score and Plan: 3 and Dexamethasone  and Ondansetron   Airway Management Planned: Oral ETT  Additional Equipment:   Intra-op Plan:   Post-operative Plan: Extubation in OR  Informed Consent: I have reviewed the patients History and Physical, chart, labs and discussed the procedure including the risks, benefits and alternatives for the proposed anesthesia with the patient or authorized representative who has indicated his/her understanding and acceptance.     Dental advisory given  Plan Discussed with: CRNA  Anesthesia Plan Comments:          Anesthesia Quick Evaluation

## 2023-11-01 NOTE — Discharge Summary (Signed)
 Electrophysiology Discharge Summary   Patient ID: Jillian Rodgers,  MRN: [id number], DOB/AGE: Jan 21, 1946 78 y.o.  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 HOLTS.  PVI Ablation & LA Posterior Wall    This study demonstrated: 1. Successful PVI 2. Successful ablation/isolation of the posterior wall 3. Successful Watchman device implant (left atrial appendage occlusion) 4. Intracardiac echo reveals trivial pericardial effusion, normal left atrial architecture 5. Transesophageal echo reveals trivial pericardial effusion, no left atrial appendage thrombus, successful left atrial appendage closure 6. No early apparent complications. 7. Colchicine  0.6mg  PO BID x 5 days 8. Protonix  40mg  PO daily x 45 days      Post Implant Anticoagulation Strategy: Continue Eliquis  5mg  by mouth twice daily for 45 days. After 45 days, stop Eliquis  and start Plavix 75mg  by mouth once daily to complete 6 months of post implant medical therapy. Plan for CT scan 60 days after implant to assess appendage patency and Watchman position.   Brief HPI: Jillian Rodgers is a 78 y.o. female with a history of Persistent Atrial Fibrillation who was referred to Electrophysiology in the outpatient setting & was felt not to be an ideal candidate for long term anticoagulation due to ITP / thrombocytopenia with need for anticoagulation due to  AF, hx of TIA.  She was admitted for planned LAAO and ablation.    Hospital Course:  The patient was admitted and underwent left atrial appendage occlusive device placement and AF ablation as above.  The patient was monitored overnight and has done very well with no concerns. Groin site has been stable without evidence of hematoma or bleeding. Wound care and restrictions were reviewed with the patient.   The patient has been scheduled for post procedure follow up with Dr. HOLTS in approximately 6 weeks. They will restart Eliquis  the evening post procedure and continue for 45 days then stop. At that time she will transition to Plavix 75mg  daily to complete 6 months of therapy. They will require dental SBE for 6 month post op and should refrain from dental work or cleanings for the first 45 days post implant. SBE to be RXd at follow up.   A repeat CT scan will be performed in approximately 60 days to ensure proper seal of the device.   Pt confirms she was taking magnesium  prior to admit.  Continue at discharge.   Physical Exam: Vitals:   11/01/23 2000 11/01/23 2338 11/02/23 0300 11/02/23 0829  BP: 118/70 119/74 (!) 147/80   Pulse: 77 71 70 81  Resp: 14 16 15    Temp:  98.1 F (36.7 C) 98.2 F (36.8 C) 98.5 F (36.9 C)  TempSrc:  Oral Oral Oral  SpO2: 94% 94% 95% 95%  Weight:      Height:        GEN: Well nourished, well developed in no acute distress NECK: No JVD; No carotid bruits CARDIAC: Regular rate and rhythm, no murmurs, rubs, gallops RESPIRATORY:  Clear to auscultation without rales, wheezing or rhonchi  ABDOMEN:  Soft, non-tender, non-distended EXTREMITIES:  No edema; No deformity. Groin site Stable     Discharge Medications:  Allergies as of 11/02/2023   No Known Allergies      Medication List     TAKE these medications    atorvastatin  80 MG tablet Commonly known as: LIPITOR  Take 1 tablet (80 mg total) by mouth daily.   Botox 100 units Solr injection Generic  drug: botulinum toxin Type A Inject 100 Units into the muscle See admin instructions. Inject 100 units intramuscularly every 6-8 weeks   cetirizine 10 MG tablet Commonly known as: ZYRTEC Take 10 mg by mouth daily.   colchicine  0.6 MG tablet Take 1 tablet (0.6 mg total) by mouth 2 (two) times daily for 5 days. Does not need refills, post procedure medication.   diltiazem  180 MG 24 hr capsule Commonly known as: CARDIZEM  CD Take 1 capsule (180 mg total) by mouth daily.   divalproex  500 MG 24 hr tablet Commonly known as: DEPAKOTE  ER Take 1 tablet (500 mg total) by mouth daily.   Eliquis  5 MG Tabs tablet Generic drug: apixaban  TAKE 1 TABLET(5 MG) BY MOUTH TWICE DAILY   furosemide  20 MG tablet Commonly known as: LASIX  TAKE 1 TABLET(20 MG) BY MOUTH DAILY AS NEEDED   gabapentin  300 MG capsule Commonly known as: NEURONTIN  Take 300 mg (1 tablet) in the morning and 600 mg (2 tablets) at bedtime   levothyroxine  50 MCG tablet Commonly known as: SYNTHROID  Take  1 tablet  Daily  on an empty stomach with only water for 30 minutes & no Antacid meds, Calcium  or Magnesium  for 4 hours & avoid Biotin                                                                                                                    /                                                                   TAKE                                         BY                                                 MOUTH What changed:  how much to take how to take this when to take this additional instructions   magnesium  oxide 400 (240 Mg) MG tablet Commonly known as: MAG-OX Take 1 tablet (400 mg total) by mouth at bedtime.   Magnesium  Oxide 400  MG Caps Take 1 capsule (400 mg total) by mouth at bedtime.   metoprolol  tartrate 25 MG tablet Commonly known as: LOPRESSOR  Take 1.5 tablets (37.5 mg total) by mouth 2 (two) times daily.   pantoprazole  40 MG tablet Commonly known as: PROTONIX  Take 1 tablet (40 mg total) by mouth  daily. What changed: You were already taking a medication with the same name, and this prescription was added. Make sure you understand how and when to take each.   pantoprazole  40 MG tablet Commonly known as: PROTONIX  Take  1 tablet  2 x /day  for Acid Indigestion & Heartburn Start taking on: December 18, 2023 What changed: These instructions start on December 18, 2023. If you are unsure what to do until then, ask your doctor or other care provider.   Prolia 60 MG/ML Sosy injection Generic drug: denosumab Inject 60 mg into the skin every 6 (six) months.   Promacta  25 MG tablet Generic drug: eltrombopag  Take 1 tablet (25 mg total) by mouth daily. Take on an empty stomach, 1 hour before a meal or 2 hours after.   sodium chloride  0.65 % Soln nasal spray Commonly known as: OCEAN Place 1 spray into both nostrils as needed for congestion.   Super Calcium  1500 (600 Ca) MG Tabs tablet Generic drug: calcium  carbonate Take 600 mg of elemental calcium  by mouth at bedtime.   Systane Balance 0.6 % Soln Generic drug: Propylene Glycol Place 1 drop into both eyes at bedtime as needed (dry eyes).   Systane Ultra 0.4-0.3 % Soln Generic drug: Polyethyl Glycol-Propyl Glycol Place 1 drop into both eyes daily as needed (dry eyes).   TYLENOL  500 MG tablet Generic drug: acetaminophen  Take 500-1,000 mg by mouth every 6 (six) hours as needed for mild pain or headache.   Vitamin C 500 MG Caps Take 500 mg by mouth daily.   Vitamin D3 125 MCG (5000 UT) Tabs Take 5,000 Units by mouth daily.   Zinc 30 MG Tabs Take 30 mg by mouth daily.        Disposition:  Home with usual follow up as in AVS  Duration of Discharge Encounter:  APP Time: 36 minutes  Signed, Daphne Barrack, NP-C, AGACNP-BC [facility] - Electrophysiology  11/02/2023, 9:55 AM

## 2023-11-01 NOTE — Plan of Care (Signed)
  Problem: Education: Goal: Knowledge of cardiac device and self-care will improve Outcome: Progressing Goal: Ability to safely manage health related needs after discharge will improve Outcome: Progressing Goal: Individualized Educational Video(s) Outcome: Progressing   Problem: Cardiac: Goal: Ability to achieve and maintain adequate cardiopulmonary perfusion will improve Outcome: Progressing   Problem: Education: Goal: Knowledge of General Education information will improve Description: Including pain rating scale, medication(s)/side effects and non-pharmacologic comfort measures Outcome: Progressing   Problem: Health Behavior/Discharge Planning: Goal: Ability to manage health-related needs will improve Outcome: Progressing   Problem: Clinical Measurements: Goal: Ability to maintain clinical measurements within normal limits will improve Outcome: Progressing Goal: Will remain free from infection Outcome: Progressing Goal: Diagnostic test results will improve Outcome: Progressing Goal: Respiratory complications will improve Outcome: Progressing Goal: Cardiovascular complication will be avoided Outcome: Progressing   Problem: Activity: Goal: Risk for activity intolerance will decrease Outcome: Progressing   Problem: Nutrition: Goal: Adequate nutrition will be maintained Outcome: Progressing   Problem: Coping: Goal: Level of anxiety will decrease Outcome: Progressing   Problem: Elimination: Goal: Will not experience complications related to bowel motility Outcome: Progressing Goal: Will not experience complications related to urinary retention Outcome: Progressing   Problem: Pain Managment: Goal: General experience of comfort will improve and/or be controlled Outcome: Progressing   Problem: Safety: Goal: Ability to remain free from injury will improve Outcome: Progressing   Problem: Skin Integrity: Goal: Risk for impaired skin integrity will  decrease Outcome: Progressing   Problem: Education: Goal: Understanding of disease, treatment, and recovery process will improve Outcome: Progressing   Problem: Activity: Goal: Ability to return to baseline activity level will improve Outcome: Progressing   Problem: Cardiac: Goal: Ability to maintain adequate cardiovascular perfusion will improve Outcome: Progressing Goal: Vascular access site(s) Level 0-1 will be maintained Outcome: Progressing   Problem: Health Behavior/ Discharge Planning: Goal: Ability to safely manage health related needs after discharge Outcome: Progressing

## 2023-11-01 NOTE — H&P (Signed)
 Electrophysiology Office Note:     Date:  11/01/2023    ID:  Jillian Rodgers, DOB [birth year], MRN [id number]   [facility] Cardiologist:  Jillian LITTIE Nanas, MD  [facility] Electrophysiologist:  Jillian ONEIDA HOLTS, MD    Referring MD: Jillian Fallow, MD    Chief Complaint: Atrial fibrillation   History of Present Illness:     [name] is a 78 year old woman who I am seeing today for an evaluation of atrial fibrillation and stroke risk mitigation at the request of Dr. Nanas.  The patient has a history of TIA in 2024, chronic thrombocytopenia, GERD, hypertension, hyperlipidemia, atrial fibrillation not on anticoagulation and hypothyroidism.  Her TIA occurred in July 2024.  She had a 10 to 15-minute episode of inability to speak.  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   She had a bone marrow biopsy in December 2021.  She had a repeat bone marrow biopsy in October 2024.   The patient is with her husband today in clinic.  Presents for AF ablation and LAAO today. Procedures reviewed.   Objective Their past medical, social and family history was reviewed.     ROS:   Please see the history of present illness.    All other systems reviewed and are negative.   EKGs/Labs/Other Studies Reviewed:     The following studies were reviewed today:   January 12, 2023 echo EF 60-65 RV normal Severely dilated left atrium No significant valvular disease   March 23, 2023 EKG shows A-fib with RVR.  Ventricular rates 176 bpm   October 17, 2022 EKG shows sinus rhythm              Physical Exam:     VS:  BP 167/98 (BP Location: Left Arm, Patient Position: Sitting, Cuff Size: Normal)   Pulse 73   Ht 5' 2.5 (1.588 m)   Wt 152 lb (68.9 kg)   SpO2 98%   BMI 27.36 kg/m         Wt Readings from Last 3 Encounters:  04/17/23 152 lb (68.9 kg)  04/03/23 158 lb 8 oz (71.9 kg)  03/28/23 162 lb 12.8 oz (73.8 kg)      GEN: no distress.  Appears younger than  stated age CARD: RRR, No MRG RESP: No IWOB. CTAB.         Assessment ASSESSMENT AND PLAN:     1. Thrombocytopenia (HCC)   2. Persistent atrial fibrillation (HCC)       #Atrial fibrillation #Thrombocytopenia #ITP The patient has a complex situation involving symptomatic atrial fibrillation with rapid ventricular rates and an inability to take anticoagulation despite an elevated CHA2DS2-VASc and a history of TIA.  Ideally we would pursue a solution that targeted not only the atrial fibrillation but also her long-term stroke risk.  For any invasive procedure, she would need to be able to tolerate short-term anticoagulation.   Ideally, would start anticoagulation at least 1 week prior to combined ablation/Watchman (or continue the medication given she is currently taking).  Plan to continue the anticoagulation for 90 days before transitioning to Plavix monotherapy for an additional 3 months.  I will discuss short-term anticoagulation with her oncology team.   ------------   Discussed treatment options today for AF including antiarrhythmic drug therapy and ablation. Discussed risks, recovery and likelihood of success with each treatment strategy. Risk, benefits, and alternatives to EP study and ablation for afib were discussed. These risks include but are not limited  to stroke, bleeding, vascular damage, tamponade, perforation, damage to the esophagus, lungs, phrenic nerve and other structures, pulmonary vein stenosis, worsening renal function, coronary vasospasm and death.  Discussed potential need for repeat ablation procedures and antiarrhythmic drugs after an initial ablation. The patient understands these risk and wishes to proceed.  We will therefore proceed with catheter ablation at the next available time.  Carto, ICE, anesthesia are requested for the procedure.  Will also obtain CT PV protocol prior to the procedure to exclude LAA thrombus and further evaluate atrial anatomy.    ----------------   I have seen Jillian Rodgers in the office today who is being considered for a Watchman left atrial appendage closure device. I believe they will benefit from this procedure given their history of atrial fibrillation, CHA2DS2-VASc score of 6. Unfortunately, the patient is not felt to be a long term anticoagulation candidate secondary to thrombocytopenia. The patient's chart has been reviewed and I feel that they would be a candidate for short term oral anticoagulation after Watchman implant.    It is my belief that after undergoing a LAA closure procedure, Jillian Rodgers will not need long term anticoagulation which eliminates anticoagulation side effects and major bleeding risk.    Procedural risks for the Watchman implant have been reviewed with the patient including a 0.5% risk of stroke, <1% risk of perforation and <1% risk of device embolization. Other risks include bleeding, vascular damage, tamponade, worsening renal function, and death. The patient understands these risk and wishes to proceed.       The published clinical data on the safety and effectiveness of WATCHMAN include but are not limited to the following: - Holmes DR, Jess BEARD, Sick P et al. for the PROTECT AF Investigators. Percutaneous closure of the left atrial appendage versus warfarin therapy for prevention of stroke in patients with atrial fibrillation: a randomised non-inferiority trial. Lancet 2009; 374: 534-42. GLENWOOD Jess BEARD, Doshi SK, Jonita VEAR Satchel D et al. on behalf of the PROTECT AF Investigators. Percutaneous Left Atrial Appendage Closure for Stroke Prophylaxis in Patients With Atrial Fibrillation 2.3-Year Follow-up of the PROTECT AF (Watchman Left Atrial Appendage System for Embolic Protection in Patients With Atrial Fibrillation) Trial. Circulation 2013; 127:720-729. - Alli O, Doshi S,  Kar S, Reddy VY, Sievert H et al. Quality of Life Assessment in the Randomized PROTECT AF (Percutaneous Closure  of the Left Atrial Appendage Versus Warfarin Therapy for Prevention of Stroke in Patients With Atrial Fibrillation) Trial of Patients at Risk for Stroke With Nonvalvular Atrial Fibrillation. J Am Coll Cardiol 2013; 61:1790-8. GLENWOOD Satchel DR, Archer RAMAN, Price M, Whisenant B, Sievert H, Doshi S, Huber K, Reddy V. Prospective randomized evaluation of the Watchman left atrial appendage Device in patients with atrial fibrillation versus long-term warfarin therapy; the PREVAIL trial. Journal of the [employer] of Cardiology, Vol. 4, No. 1, 2014, 1-11. - Kar S, Doshi SK, Sadhu A, Horton R, Osorio J et al. Primary outcome evaluation of a next-generation left atrial appendage closure device: results from the PINNACLE FLX trial. Circulation 2021;143(18)1754-1762.      After today's visit with the patient which was dedicated solely for shared decision making visit regarding LAA closure device, the patient decided to proceed with the LAA appendage closure procedure scheduled to be done in the near future at [facility]. Prior to the procedure, I would like to obtain a gated CT scan of the chest with contrast timed for PV/LA visualization.    HAS-BLED score  3 Hypertension Yes  Abnormal renal and liver function (Dialysis, transplant, Cr >2.26 mg/dL /Cirrhosis or Bilirubin >2x Normal or AST/ALT/AP >3x Normal) okayNo  Stroke Yes  Bleeding No  Labile INR (Unstable/high INR) No  Elderly (>65) Yes  Drugs or alcohol  (>= 8 drinks/week, anti-plt or NSAID) No    CHA2DS2-VASc Score = 6  The patient's score is based upon: CHF History: 0 HTN History: 1 Diabetes History: 0 Stroke History: 2 Vascular Disease History: 0 Age Score: 2 Gender Score: 1    Presents for LAAO and AF ablation. Procedures reviewed.     Signed, Jillian DASEN. Cindie, MD, [facility], [facility] 11/01/2023 Electrophysiology [facility]

## 2023-11-02 ENCOUNTER — Encounter (HOSPITAL_COMMUNITY): Payer: Self-pay | Admitting: Cardiology

## 2023-11-02 ENCOUNTER — Other Ambulatory Visit: Payer: Self-pay

## 2023-11-02 ENCOUNTER — Other Ambulatory Visit (HOSPITAL_COMMUNITY): Payer: Self-pay

## 2023-11-02 MED ORDER — MAGNESIUM OXIDE -MG SUPPLEMENT 400 (240 MG) MG PO TABS: 400.0000 mg | ORAL_TABLET | Freq: Every day | ORAL | Status: DC

## 2023-11-02 MED ORDER — PANTOPRAZOLE SODIUM 40 MG PO TBEC: DELAYED_RELEASE_TABLET | ORAL | Status: AC

## 2023-11-02 MED ORDER — COLCHICINE 0.6 MG PO TABS
0.6000 mg | ORAL_TABLET | Freq: Two times a day (BID) | ORAL | 0 refills | Status: AC
  Filled 2023-11-02: qty 10, 5d supply, fill #0

## 2023-11-02 MED ORDER — PANTOPRAZOLE SODIUM 40 MG PO TBEC
40.0000 mg | DELAYED_RELEASE_TABLET | Freq: Every day | ORAL | 0 refills | Status: DC
  Filled 2023-11-02: qty 45, 45d supply, fill #0

## 2023-11-02 MED FILL — Fentanyl Citrate Preservative Free (PF) Inj 100 MCG/2ML: INTRAMUSCULAR | Qty: 2 | Status: AC

## 2023-11-02 NOTE — Anesthesia Postprocedure Evaluation (Signed)
 Anesthesia Post Note  Patient: Jillian Rodgers  Procedure(s) Performed: ATRIAL FIBRILLATION ABLATION LEFT ATRIAL APPENDAGE OCCLUSION TRANSESOPHAGEAL ECHOCARDIOGRAM     Patient location during evaluation: Cath Lab Anesthesia Type: General Level of consciousness: sedated and patient cooperative Pain management: pain level controlled Vital Signs Assessment: post-procedure vital signs reviewed and stable Respiratory status: spontaneous breathing Cardiovascular status: stable Anesthetic complications: no   There were no known notable events for this encounter.  Last Vitals:  Vitals:   11/02/23 0300 11/02/23 0829  BP: (!) 147/80   Pulse: 70 81  Resp: 15   Temp: 36.8 C 36.9 C  SpO2: 95% 95%    Last Pain:  Vitals:   11/02/23 1019  TempSrc:   PainSc: 8                  Jp [employer]

## 2023-11-02 NOTE — Discharge Instructions (Addendum)
 Cardiac Ablation, Care After  This sheet gives you information about how to care for yourself after your procedure. Your health care provider may also give you more specific instructions. If you have problems or questions, contact your health care provider. What can I expect after the procedure? After the procedure, it is common to have: Bruising around your puncture site. Tenderness around your puncture site. Skipped heartbeats. If you had an atrial fibrillation ablation, you may have atrial fibrillation during the first several months after your procedure.  Tiredness (fatigue).  Follow these instructions at home: Puncture site care  Follow instructions from your health care provider about how to take care of your puncture site. Make sure you: If present, leave stitches (sutures), skin glue, or adhesive strips in place. These skin closures may need to stay in place for up to 2 weeks. If adhesive strip edges start to loosen and curl up, you may trim the loose edges. Do not remove adhesive strips completely unless your health care provider tells you to do that. If a large square bandage is present, this may be removed 24 hours after surgery.  Check your puncture site every day for signs of infection. Check for: Redness, swelling, or pain. Fluid or blood. If your puncture site starts to bleed, lie down on your back, apply firm pressure to the area, and contact your health care provider. Warmth. Pus or a bad smell. A pea or marble sized lump/knot at the site is normal and can take up to three months to resolve.  Driving Do not drive for at least 4 days after your procedure or however long your health care provider recommends. (Do not resume driving if you have previously been instructed not to drive for other health reasons.) Do not drive or use heavy machinery while taking prescription pain medicine. Activity Avoid activities that take a lot of effort for at least 7 days after your  procedure. Do not lift anything that is heavier than 5 lb (4.5 kg) for one week.  No sexual activity for 1 week.  Return to your normal activities as told by your health care provider. Ask your health care provider what activities are safe for you. General instructions Take over-the-counter and prescription medicines only as told by your health care provider. Do not use any products that contain nicotine or tobacco, such as cigarettes and e-cigarettes. If you need help quitting, ask your health care provider. You may shower after 24 hours, but Do not take baths, swim, or use a hot tub for 1 week.  Do not drink alcohol  for 24 hours after your procedure. Keep all follow-up visits as told by your health care provider. This is important. Contact a health care provider if: You have redness, mild swelling, or pain around your puncture site. You have fluid or blood coming from your puncture site that stops after applying firm pressure to the area. Your puncture site feels warm to the touch. You have pus or a bad smell coming from your puncture site. You have a fever. You have chest pain or discomfort that spreads to your neck, jaw, or arm. You have chest pain that is worse with lying on your back or taking a deep breath. You are sweating a lot. You feel nauseous. You have a fast or irregular heartbeat. You have shortness of breath. You are dizzy or light-headed and feel the need to lie down. You have pain or numbness in the arm or leg closest to your puncture site.  Get help right away if: Your puncture site suddenly swells. Your puncture site is bleeding and the bleeding does not stop after applying firm pressure to the area. These symptoms may represent a serious problem that is an emergency. Do not wait to see if the symptoms will go away. Get medical help right away. Call your local emergency services (911 in the [place] Do not drive yourself to the hospital. Summary After the procedure, it  is normal to have bruising and tenderness at the puncture site in your groin, neck, or forearm. Check your puncture site every day for signs of infection. Get help right away if your puncture site is bleeding and the bleeding does not stop after applying firm pressure to the area. This is a medical emergency. This information is not intended to replace advice given to you by your health care provider. Make sure you discuss any questions you have with your health care provider.   [facility] Procedure, Care After  Procedure MD: Dr. Cindie Olds Clinical Coordinator: Rockie Redman, RN  This sheet gives you information about how to care for yourself after your procedure. Your health care provider may also give you more specific instructions. If you have problems or questions, contact your health care provider.  What can I expect after the procedure? After the procedure, it is common to have: Bruising around your puncture site. Tenderness around your puncture site. Tiredness (fatigue).  Medication instructions It is very important to continue to take your blood thinner as directed by your doctor after the Watchman procedure. Call your procedure doctor's office with question or concerns. If you are on Coumadin (warfarin), you will have your INR checked the week after your procedure, with a goal INR of 2.0 - 3.0. Please follow your medication instructions on your discharge summary. Only take the medications listed on your discharge paperwork.  Follow up You will be seen in 6 weeks after your procedure You will have a repeat CT scan or Echocardiogram approximately 8 weeks after your procedure mark to check your device You will follow up the MD/APP who performed your procedure 6 months after your procedure The Watchman Clinical Coordinator will check in with you from time to time, including 1 and 2 years after your procedure.  NO DENTAL CLEANINGS FOR 45 days. After that, you will require  antibiotics for dental procedures the first 6 months.   Follow these instructions at home: Puncture site care  Follow instructions from your health care provider about how to take care of your puncture site. Make sure you: If present, leave stitches (sutures), skin glue, or adhesive strips in place.  If a large square bandage is present, this may be removed 24 hours after surgery.  Check your puncture site every day for signs of infection. Check for: Redness, swelling, or pain. Fluid or blood. If your puncture site starts to bleed, lie down on your back, apply firm pressure to the area, and contact your health care provider. Warmth. Pus or a bad smell. Driving Do not drive yourself home if you received sedation Do not drive for at least 4 days after your procedure or however long your health care provider recommends. (Do not resume driving if you have previously been instructed not to drive for other health reasons.) Do not spend greater than 1 hour at a time in a car for the first 3 days. Stop and take a break with a 5 minute walk at least every hour.  Do not drive or use  heavy machinery while taking prescription pain medicine.  Activity Avoid activities that take a lot of effort, including exercise, for at least 7 days after your procedure. For the first 3 days, avoid sitting for longer than one hour at a time.  Avoid alcoholic beverages, signing paperwork, or participating in legal proceedings for 24 hours after receiving sedation Do not lift anything that is heavier than 10 lb (4.5 kg) for one week.  No sexual activity for 1 week.  Return to your normal activities as told by your health care provider. Ask your health care provider what activities are safe for you. General instructions Take over-the-counter and prescription medicines only as told by your health care provider. Do not use any products that contain nicotine or tobacco, such as cigarettes and e-cigarettes. If you need help  quitting, ask your health care provider. You may shower after 24 hours, but Do not take baths, swim, or use a hot tub for 1 week.  Do not drink alcohol  for 24 hours after your procedure. Keep all follow-up visits as told by your health care provider. This is important. Dental Work: No dental work for the 45 days after your procedure. You will require antibiotics prior to any dental work, including cleanings, for 6 months after your Watchman implantation to help protect you from infection. After 6 months, antibiotics are no longer required. Contact a health care provider if: You have redness, mild swelling, or pain around your puncture site. You have soreness in your throat or at your puncture site that does not improve after several days You have fluid or blood coming from your puncture site that stops after applying firm pressure to the area. Your puncture site feels warm to the touch. You have pus or a bad smell coming from your puncture site. You have a fever. You have chest pain or discomfort that spreads to your neck, jaw, or arm. You are sweating a lot. You feel nauseous. You have a fast or irregular heartbeat. You have shortness of breath. You are dizzy or light-headed and feel the need to lie down. You have pain or numbness in the arm or leg closest to your puncture site. Get help right away if: Your puncture site suddenly swells. Your puncture site is bleeding and the bleeding does not stop after applying firm pressure to the area. These symptoms may represent a serious problem that is an emergency. Do not wait to see if the symptoms will go away. Get medical help right away. Call your local emergency services (911 in the [place] Do not drive yourself to the hospital. Summary After the procedure, it is normal to have bruising and tenderness at the puncture site in your groin, neck, or forearm. Check your puncture site every day for signs of infection. Get help right away if your  puncture site is bleeding and the bleeding does not stop after applying firm pressure to the area. This is a medical emergency.  This information is not intended to replace advice given to you by your health care provider. Make sure you discuss any questions you have with your health care provider.

## 2023-11-02 NOTE — Plan of Care (Signed)
  Problem: Education: Goal: Knowledge of cardiac device and self-care will improve Outcome: Adequate for Discharge Goal: Ability to safely manage health related needs after discharge will improve Outcome: Adequate for Discharge Goal: Individualized Educational Video(s) Outcome: Adequate for Discharge   Problem: Cardiac: Goal: Ability to achieve and maintain adequate cardiopulmonary perfusion will improve Outcome: Adequate for Discharge   Problem: Education: Goal: Knowledge of General Education information will improve Description: Including pain rating scale, medication(s)/side effects and non-pharmacologic comfort measures Outcome: Adequate for Discharge   Problem: Health Behavior/Discharge Planning: Goal: Ability to manage health-related needs will improve Outcome: Adequate for Discharge   Problem: Clinical Measurements: Goal: Ability to maintain clinical measurements within normal limits will improve Outcome: Adequate for Discharge Goal: Will remain free from infection Outcome: Adequate for Discharge Goal: Diagnostic test results will improve Outcome: Adequate for Discharge Goal: Respiratory complications will improve Outcome: Adequate for Discharge Goal: Cardiovascular complication will be avoided Outcome: Adequate for Discharge   Problem: Activity: Goal: Risk for activity intolerance will decrease Outcome: Adequate for Discharge   Problem: Nutrition: Goal: Adequate nutrition will be maintained Outcome: Adequate for Discharge   Problem: Coping: Goal: Level of anxiety will decrease Outcome: Adequate for Discharge   Problem: Elimination: Goal: Will not experience complications related to bowel motility Outcome: Adequate for Discharge Goal: Will not experience complications related to urinary retention Outcome: Adequate for Discharge   Problem: Pain Managment: Goal: General experience of comfort will improve and/or be controlled Outcome: Adequate for Discharge    Problem: Safety: Goal: Ability to remain free from injury will improve Outcome: Adequate for Discharge   Problem: Skin Integrity: Goal: Risk for impaired skin integrity will decrease Outcome: Adequate for Discharge   Problem: Education: Goal: Understanding of disease, treatment, and recovery process will improve Outcome: Adequate for Discharge   Problem: Activity: Goal: Ability to return to baseline activity level will improve Outcome: Adequate for Discharge   Problem: Cardiac: Goal: Ability to maintain adequate cardiovascular perfusion will improve Outcome: Adequate for Discharge Goal: Vascular access site(s) Level 0-1 will be maintained Outcome: Adequate for Discharge   Problem: Health Behavior/ Discharge Planning: Goal: Ability to safely manage health related needs after discharge Outcome: Adequate for Discharge

## 2023-11-02 NOTE — TOC CM/SW Note (Signed)
 Transition of Care [facility]) - Inpatient Brief Assessment   Patient Details  Name: RITI ROLLYSON MRN: [id number] Date of Birth: 12/21/1945  [facility]) CM/SW Contact:    Lauraine FORBES Saa, LCSW Phone Number: 11/02/2023, 9:18 AM   Clinical Narrative:  9:18 AM Per chart review, patient resides at home with spouse. Patient has a PCP and insurance. Patient does not have SNF/HH/DME history. Patient's preferred pharmacy's are [facility], [facility], and [place] [id number] [address]. No TOC needs were identified at this time. TOC will continue to follow and be available to assist.  Transition of Care Asessment: Insurance and Status: Insurance coverage has been reviewed Patient has primary care physician: Yes Home environment has been reviewed: Private Residence Prior level of function:: N/A Prior/Current Home Services: No current home services Social Drivers of Health Review: SDOH reviewed no interventions necessary Readmission risk has been reviewed: Yes Transition of care needs: no transition of care needs at this time

## 2023-11-06 ENCOUNTER — Telehealth: Payer: Self-pay

## 2023-11-06 DIAGNOSIS — I4819 Other persistent atrial fibrillation: Secondary | ICD-10-CM

## 2023-11-06 DIAGNOSIS — D696 Thrombocytopenia, unspecified: Secondary | ICD-10-CM

## 2023-11-06 DIAGNOSIS — Z95818 Presence of other cardiac implants and grafts: Secondary | ICD-10-CM

## 2023-11-06 NOTE — Telephone Encounter (Signed)
  [facility]   [facility] Team  Contacted the patient regarding discharge from [facility] on 11/02/2023  The patient reports her groin sites look good with no S/S of infection, swelling, or bleeding.  She did report increased swelling in her feet since the procedure. Instructed her to limit salt, elevate feet when sitting, and instructed her to take her PRN Lasix  tomorrow AM. Will call tomorrow to check on her.  Will arrange 60 day CT at that time.

## 2023-11-07 ENCOUNTER — Ambulatory Visit: Payer: Medicare Other | Admitting: Neurology

## 2023-11-07 NOTE — Addendum Note (Signed)
 Addended by: Ichelle Harral A on: 11/07/2023 02:14 PM   Modules accepted: Orders

## 2023-11-07 NOTE — Telephone Encounter (Signed)
 Called the patient to check in. She took Lasix  40 mg this AM. The swelling in her feet as slightly improved. She weighs herself daily and actually lost minimal weight since yesterday. She will taking Lasix  40 mg again tomorrow and then Lasix  20 mg daily until her visit with Dr. Kate on 8/5. Instructed her to elevate feet, limit salt, and to call if her weight increased 3 pounds in a day or 5 pounds in a week.  Will route to Dr. Kate and let the patient know if he has further recommendations prior to that visit.

## 2023-11-16 ENCOUNTER — Ambulatory Visit: Admitting: Podiatry

## 2023-11-18 NOTE — Progress Notes (Unsigned)
 Cardiology Office Note:    Date:  11/20/2023   ID:  Jillian Rodgers, DOB Sep 23, 1945, MRN [id number]  PCP:  [name], MD  Cardiologist:  Lonni LITTIE Nanas, MD  Electrophysiologist:  OLE ONEIDA HOLTS, MD   Referring MD: [name], MD   Chief Complaint  Patient presents with   Atrial Fibrillation    History of Present Illness:    Jillian Rodgers is a 78 y.o. female with a hx of paroxysmal atrial fibrillation, chronic thrombocytopenia, TIA, CKD stage III, hypertension, hypothyroidism, hyperlipidemia who presents for follow-up.  She was diagnosed with A-fib 10/2020, underwent successful DCCV at that time.  She was taken off anticoagulation due to chronic soft thrombocytopenia.  Admitted 10/2022 with TIA.  She was admitted 12/2022 with A-fib with RVR.  During admission, her platelets were stable in 60s and in discussion with hematology she tolerated heparin  drip she was transitioned to Eliquis .  She spontaneously converted to normal sinus rhythm during admission and was discharged on Eliquis  and metoprolol .  At follow-up in 01/2023, her platelet count had declined to 30s and Eliquis  was held.  Underwent steroid treatment for thrombocytopenia on 02/2023 and IVIG 03/2023 with improvement in platelet count and restarted on Eliquis .  She was admitted 03/2023 with recurrent A-fib with RVR, was continued on Eliquis  5 mg twice daily, and she spontaneously converted to sinus rhythm during admission.  She was seen by Dr. HOLTS on 04/17/2023, planning A-fib ablation/Watchman procedure.  She was admitted with recurrent A-fib 04/18/2023.  She underwent A-fib ablation and watchman placement on 11/01/2023.  Since last clinic visit, she reports she is feeling very fatigued and lightheaded.  States that she had to take a wheelchair to the clinic today because she did not feel that she could walk because feeling so lightheaded.  Reports BP has been down to 80s when checks at home.  She was  having worsening swelling in her legs and taking Lasix  daily since her ablation.  Reports swelling is resolved and she has continued to take Lasix .  Reports had episode of left-sided chest pain this morning and felt short of breath.  Reports pain on left side of chest, felt like pressure.  Lasted about 10 minutes.  Currently chest pain-free.   Past Medical History:  Diagnosis Date   Anemia with low platelet count (HCC)    Atrial fibrillation (HCC) 2022   Chronic kidney disease    Degenerative joint disease    Dysrhythmia    a fib   GERD (gastroesophageal reflux disease)    History of kidney stones    Hyperlipidemia    Hypothyroidism    Inguinal hernia 02/19/2019   Labile hypertension    Migraines    neurontin  helps   Pneumonia    hx of walking pneumonia    Primary hypertension 04/28/2022    Past Surgical History:  Procedure Laterality Date   ATRIAL FIBRILLATION ABLATION N/A 11/01/2023   Procedure: ATRIAL FIBRILLATION ABLATION;  Surgeon: HOLTS OLE ONEIDA, MD;  Location: [facility];  Service: Cardiovascular;  Laterality: N/A;   BILATERAL CATARACT SURGERY      BLERPHOROPLASTY \     CHOLECYSTECTOMY  1996   open   COLONOSCOPY  2009   recommended 10 year f/u   FOOT SURGERY Right    HERNIA REPAIR  1985   umb hernia rpr   HERNIA REPAIR  2005 & 2009   ventral hernia rpr   INGUINAL HERNIA REPAIR Left 04/30/2020   Procedure:  OPEN LEFT INGUINAL HERNIA REPAIR WITH MESH;  Surgeon: Signe Mitzie LABOR, MD;  Location: [facility];  Service: General;  Laterality: Left;   INGUINAL HERNIA REPAIR Right 10/24/2021   Procedure: OPEN RIGHT INGUINAL HERNIA REPAIR WITH MESH ;  Surgeon: Signe Mitzie LABOR, MD;  Location: [facility];  Service: General;  Laterality: Right;   LEFT ATRIAL APPENDAGE OCCLUSION N/A 11/01/2023   Procedure: LEFT ATRIAL APPENDAGE OCCLUSION;  Surgeon: Cindie Ole DASEN, MD;  Location: [facility];  Service: Cardiovascular;  Laterality: N/A;   PILONIDAL CYST EXCISION   1972   SKIN CANCER REMOVAL      TONSILLECTOMY     TRANSESOPHAGEAL ECHOCARDIOGRAM (CATH LAB) N/A 11/01/2023   Procedure: TRANSESOPHAGEAL ECHOCARDIOGRAM;  Surgeon: Cindie Ole DASEN, MD;  Location: [facility] INV[facility]IVE CV [facility];  Service: Cardiovascular;  Laterality: N/A;   UPPER GI ENDOSCOPY     VENTRAL HERNIA REPAIR      Current Medications: Current Meds  Medication Sig   apixaban  (ELIQUIS ) 5 MG TABS tablet TAKE 1 TABLET(5 MG) BY MOUTH TWICE DAILY   Ascorbic Acid (VITAMIN C) 500 MG CAPS Take 500 mg by mouth daily.    atorvastatin  (LIPITOR ) 80 MG tablet Take 1 tablet (80 mg total) by mouth daily.   BOTOX 100 units SOLR injection Inject 100 Units into the muscle See admin instructions. Inject 100 units intramuscularly every 6-8 weeks   calcium  carbonate (SUPER CALCIUM ) 1500 (600 Ca) MG TABS tablet Take 600 mg of elemental calcium  by mouth at bedtime.   cetirizine (ZYRTEC) 10 MG tablet Take 10 mg by mouth daily.   Cholecalciferol (VITAMIN D3) 125 MCG (5000 UT) TABS Take 5,000 Units by mouth daily.   denosumab (PROLIA) 60 MG/ML SOSY injection Inject 60 mg into the skin every 6 (six) months.   diltiazem  (CARDIZEM  CD) 180 MG 24 hr capsule Take 1 capsule (180 mg total) by mouth daily.   divalproex  (DEPAKOTE  ER) 500 MG 24 hr tablet Take 1 tablet (500 mg total) by mouth daily.   eltrombopag  (PROMACTA ) 25 MG tablet Take 1 tablet (25 mg total) by mouth daily. Take on an empty stomach, 1 hour before a meal or 2 hours after.   furosemide  (LASIX ) 20 MG tablet TAKE 1 TABLET(20 MG) BY MOUTH DAILY AS NEEDED   gabapentin  (NEURONTIN ) 300 MG capsule Take 300 mg (1 tablet) in the morning and 600 mg (2 tablets) at bedtime   levothyroxine  (SYNTHROID ) 50 MCG tablet Take  1 tablet  Daily  on an empty stomach with only water for 30 minutes & no Antacid meds, Calcium  or Magnesium  for 4 hours & avoid Biotin                                                                                                                    /  TAKE                                         BY                                                 MOUTH (Patient taking differently: Take 50 mcg by mouth See admin instructions. Take 50 mcg by mouth in the morning on an empty stomach, with only water. Take no antacids, calcium , or magnesium  for 4 hours and avoid biotin.)   magnesium  oxide (MAG-OX) 400 (240 Mg) MG tablet Take 1 tablet (400 mg total) by mouth at bedtime.   metoprolol  tartrate (LOPRESSOR ) 25 MG tablet Take 1.5 tablets (37.5 mg total) by mouth 2 (two) times daily.   pantoprazole  (PROTONIX ) 40 MG tablet Take 1 tablet (40 mg total) by mouth daily.   SYSTANE BALANCE 0.6 % SOLN Place 1 drop into both eyes at bedtime as needed (dry eyes).   TYLENOL  500 MG tablet Take 500-1,000 mg by mouth every 6 (six) hours as needed for mild pain or headache.   Zinc 30 MG TABS Take 30 mg by mouth daily.     Allergies:   Patient has no known allergies.   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  Food Insecurity: No Food Insecurity (11/01/2023)   Hunger Vital Sign    Worried About Running Out of Food in the Last Year: Never true    Ran Out of Food in the Last Year: Never true  Transportation Needs: No Transportation Needs (11/01/2023)   PRAPARE - Administrator, Civil Service (Medical): No    Lack of Transportation (Non-Medical): No  Physical Activity:  Not on file  Stress: Not on file  Social Connections: Unknown (11/01/2023)   Social Connection and Isolation Panel    Frequency of Communication with Friends and Family: Never    Frequency of Social Gatherings with Friends and Family: Never    Attends Religious Services: Never    Database administrator or Organizations: No    Attends Engineer, structural: Never    Marital Status: Patient declined     Family History: The patient's family history includes Breast cancer in her sister; Cancer in her mother; Congestive Heart Failure in her father; Gout in her maternal aunt; Heart disease in her mother; Hypertension in her mother; Ovarian cancer in her mother; Stroke in her paternal uncle; Uterine cancer in her mother. There is no history of Colon cancer, Esophageal cancer, Stomach cancer, or Rectal cancer.  ROS:   Please see the  history of present illness.     All other systems reviewed and are negative.  EKGs/Labs/Other Studies Reviewed:    The following studies were reviewed today:   EKG:   11/20/2023: Sinus bradycardia, rate 59, nonspecific T wave flattening 04/23/2023: Sinus bradycardia, rate 55, no ST abnormalities 07/25/23: Normal sinus rhythm, rate 69, no ST abnormalities  Recent Labs: 03/23/2023: TSH 3.248 03/25/2023: Magnesium  2.2 04/18/2023: B Natriuretic Peptide 721.8 10/04/2023: ALT 12 10/31/2023: BUN 33; Creatinine, Ser 1.33; Hemoglobin 11.8; Platelets 95; Potassium 4.6; Sodium 138  Recent Lipid Panel    Component Value Date/Time   CHOL 143 01/03/2023 1533   TRIG 94 01/03/2023 1533   HDL 72 01/03/2023 1533   CHOLHDL 2.0 01/03/2023 1533   VLDL 34 (H) 09/26/2016 1147   LDLCALC 53 01/03/2023 1533    Physical Exam:    VS:  BP 96/64   Pulse (!) 56   Ht 5' 4 (1.626 m)   Wt 157 lb 9.6 oz (71.5 kg)   SpO2 95%   BMI 27.05 kg/m     Wt Readings from Last 3 Encounters:  11/20/23 157 lb 9.6 oz (71.5 kg)  11/01/23 160 lb (72.6 kg)  10/04/23 160 lb 3.2 oz (72.7 kg)      GEN:  Well nourished, well developed in no acute distress HEENT: Normal NECK: No JVD; No carotid bruits LYMPHATICS: No lymphadenopathy CARDIAC: RRR, no murmurs, rubs, gallops RESPIRATORY:  Clear to auscultation without rales, wheezing or rhonchi  ABDOMEN: Soft, non-tender, non-distended MUSCULOSKELETAL:  trace edema SKIN: Warm and dry NEUROLOGIC:  Alert and oriented x 3 PSYCHIATRIC:  Normal affect   ASSESSMENT:    1. Chest pain of uncertain etiology   2. Hypotension, unspecified hypotension type   3. Lightheadedness   4. Paroxysmal atrial fibrillation (HCC)   5. Thrombocytopenia (HCC)      PLAN:    Chest pain/Lightheadedness/hypotension: BP 96/64 this morning, when I rechecked was 88/60.  She reports feeling very lightheaded this morning, was unable to walk and had to use wheelchair.  She appears dry on exam.  Suspect due to overdiuresis, was previously taking Lasix  as needed but has been taking every day since her recent Afib ablation.  However also reported episode of left-sided chest pain this morning that she describes as pressure on the left side of her chest that lasted for 10 minutes.  Currently chest pain-free.  Need to rule out MI.  Also need to rule out pericardial effusion given recent ablation - Recommend going to ED.  Suspect she is dehydrated, would give fluids and check labs.  Would trend troponins to rule out MI.  Also recommend echocardiogram to rule out pericardial effusion given recent ablation.  If no evidence of MI or significant effusion, can likely discharge from ED if BP improves with IV fluids.  Would stop taking Lasix  daily and monitor daily weights, would only take as needed if gains more than 3 pounds in 1 day or 5 pounds in 1 week  Paroxysmal atrial fibrillation: Complicated situation, has had recurrent admissions for A-fib with RVR but has been unable to tolerate anticoagulation despite high CHA2DS2-VASc score (6, hypertension, age x 2, TIA, female) due to  chronic thrombocytopenia.  Echocardiogram 12/2022 showed EF 60 to 65%, normal RV function, severe left atrial enlargement, no significant valvular disease.  She underwent A-fib ablation and watchman placement on 11/01/2023. -Continue Eliquis  5 mg twice daily for now, will need to complete 90 days of anticoagulation post Watchman -Continue metoprolol  37.5 mg  twice daily and cardizem  180 mg daily  Chronic thrombocytopenia: Follows with hematology, likely ITP.  Treated with steroids 02/2023 and IVIG 03/2023, with improvement in counts.  Platelets were again down to 39 on 06/18/2023.  Underwent additional IVIG treatment and most recent platelets 95 on 10/31/2023  Lower extremity edema: On as needed Lasix  20 mg, but reports has been taking daily since her ablation.  Would hold as above  Hypertension: On metoprolol  and diltiazem   TIA: Continue atorvastatin , Eliquis   OSA: Has known OSA but has not been on CPAP.  Itamar ordered last year but states she was not called to activate device.  Discussed with sleep coordinator  RTC in 4 months   Medication Adjustments/Labs and Tests Ordered: Current medicines are reviewed at length with the patient today.  Concerns regarding medicines are outlined above.  Orders Placed This Encounter  Procedures   EKG 12-Lead   No orders of the defined types were placed in this encounter.   Patient Instructions  Medication Instructions:  Continue current medication *If you need a refill on your cardiac medications before your next appointment, please call your pharmacy*  Lab Work: none If you have labs (blood work) drawn today and your tests are completely normal, you will receive your results only by: MyChart Message (if you have MyChart) OR A paper copy in the mail If you have any lab test that is abnormal or we need to change your treatment, we will call you to review the results.  Testing/Procedures: none  Follow-Up: At [facility], you and your  health needs are our priority.  As part of our continuing mission to provide you with exceptional heart care, our providers are all part of one team.  This team includes your primary Cardiologist (physician) and Advanced Practice Providers or APPs (Physician Assistants and Nurse Practitioners) who all work together to provide you with the care you need, when you need it.  Your next appointment:   1 month(s)  Provider:   Lonni LITTIE Nanas, MD    We recommend signing up for the patient portal called MyChart.  Sign up information is provided on this After Visit Summary.  MyChart is used to connect with patients for Virtual Visits (Telemedicine).  Patients are able to view lab/test results, encounter notes, upcoming appointments, etc.  Non-urgent messages can be sent to your provider as well.   To learn more about what you can do with MyChart, go to [email or web address].   Other Instructions Please let us  know when you arrive at Emergency Room       Signed, Lonni LITTIE Nanas, MD  11/20/2023 10:55 AM    [facility]

## 2023-11-20 ENCOUNTER — Ambulatory Visit: Attending: Cardiology | Admitting: Cardiology

## 2023-11-20 ENCOUNTER — Emergency Department (HOSPITAL_BASED_OUTPATIENT_CLINIC_OR_DEPARTMENT_OTHER)

## 2023-11-20 ENCOUNTER — Emergency Department (HOSPITAL_COMMUNITY)

## 2023-11-20 ENCOUNTER — Emergency Department (HOSPITAL_COMMUNITY)
Admission: EM | Admit: 2023-11-20 | Discharge: 2023-11-20 | Disposition: A | Discharge status: Home / Self Care | Attending: Emergency Medicine | Admitting: Emergency Medicine

## 2023-11-20 ENCOUNTER — Other Ambulatory Visit: Payer: Self-pay

## 2023-11-20 ENCOUNTER — Encounter (HOSPITAL_COMMUNITY): Payer: Self-pay

## 2023-11-20 ENCOUNTER — Encounter: Payer: Self-pay | Admitting: Cardiology

## 2023-11-20 VITALS — BP 96/64 | HR 56 | Ht 64.0 in | Wt 157.6 lb

## 2023-11-20 DIAGNOSIS — R079 Chest pain, unspecified: Secondary | ICD-10-CM | POA: Diagnosis not present

## 2023-11-20 DIAGNOSIS — Z8673 Personal history of transient ischemic attack (TIA), and cerebral infarction without residual deficits: Secondary | ICD-10-CM | POA: Insufficient documentation

## 2023-11-20 DIAGNOSIS — Z7901 Long term (current) use of anticoagulants: Secondary | ICD-10-CM | POA: Insufficient documentation

## 2023-11-20 DIAGNOSIS — E039 Hypothyroidism, unspecified: Secondary | ICD-10-CM | POA: Insufficient documentation

## 2023-11-20 DIAGNOSIS — Z79899 Other long term (current) drug therapy: Secondary | ICD-10-CM | POA: Insufficient documentation

## 2023-11-20 DIAGNOSIS — Z79890 Hormone replacement therapy (postmenopausal): Secondary | ICD-10-CM | POA: Insufficient documentation

## 2023-11-20 DIAGNOSIS — N3000 Acute cystitis without hematuria: Secondary | ICD-10-CM | POA: Diagnosis not present

## 2023-11-20 DIAGNOSIS — D696 Thrombocytopenia, unspecified: Secondary | ICD-10-CM

## 2023-11-20 DIAGNOSIS — I480 Paroxysmal atrial fibrillation: Secondary | ICD-10-CM

## 2023-11-20 DIAGNOSIS — R42 Dizziness and giddiness: Secondary | ICD-10-CM | POA: Diagnosis not present

## 2023-11-20 DIAGNOSIS — E861 Hypovolemia: Secondary | ICD-10-CM | POA: Diagnosis not present

## 2023-11-20 DIAGNOSIS — I3139 Other pericardial effusion (noninflammatory): Secondary | ICD-10-CM | POA: Diagnosis not present

## 2023-11-20 DIAGNOSIS — I129 Hypertensive chronic kidney disease with stage 1 through stage 4 chronic kidney disease, or unspecified chronic kidney disease: Secondary | ICD-10-CM | POA: Insufficient documentation

## 2023-11-20 DIAGNOSIS — I959 Hypotension, unspecified: Secondary | ICD-10-CM | POA: Diagnosis not present

## 2023-11-20 DIAGNOSIS — N189 Chronic kidney disease, unspecified: Secondary | ICD-10-CM | POA: Insufficient documentation

## 2023-11-20 DIAGNOSIS — E860 Dehydration: Secondary | ICD-10-CM | POA: Diagnosis not present

## 2023-11-20 DIAGNOSIS — R5383 Other fatigue: Secondary | ICD-10-CM | POA: Diagnosis present

## 2023-11-20 LAB — ECHOCARDIOGRAM COMPLETE
AR max vel: 2.34 cm2
AV Area VTI: 2.32 cm2
AV Area mean vel: 2.21 cm2
AV Mean grad: 4 mmHg
AV Peak grad: 7.5 mmHg
Ao pk vel: 1.37 m/s
Area-P 1/2: 4.33 cm2
Calc EF: 61.6 %
Height: 64 in
S' Lateral: 2.8 cm
Single Plane A2C EF: 58 %
Single Plane A4C EF: 64.3 %
Weight: 2800 [oz_av]

## 2023-11-20 LAB — CBC
HCT: 37.5 % (ref 36.0–46.0)
Hemoglobin: 11.9 g/dL — ABNORMAL LOW (ref 12.0–15.0)
MCH: 30.1 pg (ref 26.0–34.0)
MCHC: 31.7 g/dL (ref 30.0–36.0)
MCV: 94.7 fL (ref 80.0–100.0)
Platelets: 112 K/uL — ABNORMAL LOW (ref 150–400)
RBC: 3.96 MIL/uL (ref 3.87–5.11)
RDW: 13.2 % (ref 11.5–15.5)
WBC: 7.4 K/uL (ref 4.0–10.5)
nRBC: 0 % (ref 0.0–0.2)

## 2023-11-20 LAB — URINALYSIS, W/ REFLEX TO CULTURE (INFECTION SUSPECTED)
Bilirubin Urine: NEGATIVE
Glucose, UA: NEGATIVE mg/dL
Hgb urine dipstick: NEGATIVE
Ketones, ur: NEGATIVE mg/dL
Nitrite: NEGATIVE
Protein, ur: NEGATIVE mg/dL
Specific Gravity, Urine: 1.008 (ref 1.005–1.030)
pH: 7 (ref 5.0–8.0)

## 2023-11-20 LAB — BASIC METABOLIC PANEL WITH GFR
Anion gap: 11 (ref 5–15)
BUN: 29 mg/dL — ABNORMAL HIGH (ref 8–23)
CO2: 30 mmol/L (ref 22–32)
Calcium: 10.3 mg/dL (ref 8.9–10.3)
Chloride: 100 mmol/L (ref 98–111)
Creatinine, Ser: 1.71 mg/dL — ABNORMAL HIGH (ref 0.44–1.00)
GFR, Estimated: 30 mL/min — ABNORMAL LOW (ref >60)
Glucose, Bld: 71 mg/dL (ref 70–99)
Potassium: 4.1 mmol/L (ref 3.5–5.1)
Sodium: 141 mmol/L (ref 135–145)

## 2023-11-20 LAB — TROPONIN I (HIGH SENSITIVITY)
Troponin I (High Sensitivity): 15 ng/L (ref <18)
Troponin I (High Sensitivity): 14 ng/L (ref <18)

## 2023-11-20 MED ORDER — CEPHALEXIN 500 MG PO CAPS: 500.0000 mg | ORAL_CAPSULE | Freq: Two times a day (BID) | ORAL | 0 refills | Status: AC

## 2023-11-20 MED ORDER — SODIUM CHLORIDE 0.9 % IV BOLUS
500.0000 mL | Freq: Once | INTRAVENOUS | Status: AC
  Administered 2023-11-20: 500 mL via INTRAVENOUS

## 2023-11-20 MED ORDER — CEPHALEXIN 250 MG PO CAPS
500.0000 mg | ORAL_CAPSULE | Freq: Once | ORAL | Status: AC
  Administered 2023-11-20: 500 mg via ORAL
  Filled 2023-11-20: qty 2

## 2023-11-20 NOTE — ED Provider Notes (Signed)
 Pt signed out by Dr. Lenor pending ECHO report.  ECHO:  IMPRESSIONS 1. Left ventricular ejection fraction, by estimation, is 60 to 65% . The left ventricle has normal function. The left ventricle has no regional wall motion abnormalities. Left ventricular diastolic parameters were normal. 2. Right ventricular systolic function is normal. The right ventricular size is normal. 3. Left atrial size was mildly dilated. 4. Right atrial size was mildly dilated. 5. The mitral valve is abnormal. Trivial mitral valve regurgitation. No evidence of mitral stenosis. 6. The aortic valve is tricuspid. There is mild calcification of the aortic valve. There is mild thickening of the aortic valve. Aortic valve regurgitation is not visualized. Aortic valve sclerosis is present, with no evidence of aortic valve stenosis. 7. The inferior vena cava is normal in size with greater than 50% respiratory variability, suggesting right atrial pressure of 3 mmHg.  Pt d/w Dr. Kate who recommended taking lasix  prn instead of daily.  Pt's bp has improved.  She feels better.  She is stable for d/c.  Return if worse.    Dean Clarity, MD 11/20/23 [phone]

## 2023-11-20 NOTE — ED Notes (Signed)
 Patient transported to X-ray

## 2023-11-20 NOTE — Discharge Instructions (Addendum)
 Make an appointment to have close follow-up with your doctor.  Take the antibiotics as discussed.  Stop taking the Lasix  until you have further discussions with your cardiologist.  Return to the emergency room if you have any worsening symptoms.  You should have your creatinine rechecked by your primary care doctor as it slightly more elevated than your baseline levels.  Take your lasix  only if you gain 3 pounds in 1 day or 5 pounds in 1 week.

## 2023-11-20 NOTE — ED Notes (Signed)
 Patient will be coming from Xray from triage

## 2023-11-20 NOTE — ED Triage Notes (Addendum)
 Pt arrives POV c/o intermittent left sided chest pain and dizziness that started a couple days ago. Pt went to see her cardiologist this morning who sent her here for a further workup. Pt had a procedure to have a watchman placed and an ablation on 7/17 and has had these sx since then.

## 2023-11-20 NOTE — ED Notes (Signed)
 Pt transported from xray to room 1

## 2023-11-20 NOTE — Progress Notes (Signed)
 ON-CALL CARDIOLOGY 11/20/23  Patient's name: Jillian Rodgers.   MRN: [id number].    DOB: 1945-04-23 Primary care provider: [name], MD. Primary cardiologist: Dr. Kate  Requesting physician: Dr. Lenor at [facility], ED Consulting physician: Madonna Large, DO, [facility] at [facility]   Interaction regarding this patient's care today: Patient was sent in from the clinic due to concerns for hypotension.  She has been taking diuretics on a regular basis which may be contributory and caused her to develop acute kidney injury.  She also has had a recent EP procedure and the concern was to rule out a pericardial effusion as well.  ED physician Dr. Lenor reached out to discuss her case. Recommended gentle IV hydration. Labs. An echocardiogram was ordered by myself to evaluate LVEF, pericardial effusion, and valvular heart disease.  Impression: Hypotension  Recommendations: Patient was given IV fluids and responded to well.  Blood pressures improved. Lab work consistent with a degree of acute kidney injury. Recommended to use Lasix  on as needed basis as she was doing well recently. Echocardiogram notes preserved LVEF and no pericardial effusion.   Patient will be discharged home from the ED. Recommended that she call the office for scheduling a follow-up appointment. Her primary cardiologist was also updated at the end of the day.  Telephone encounter total time: 15 minutes  Madonna Large, DO, [facility]  A Division of [facility] [address] [place],  [address]  [place], [place] [address] 11/20/2023 3:57 PM

## 2023-11-20 NOTE — Patient Instructions (Addendum)
 Medication Instructions:  Continue current medication *If you need a refill on your cardiac medications before your next appointment, please call your pharmacy*  Lab Work: none If you have labs (blood work) drawn today and your tests are completely normal, you will receive your results only by: MyChart Message (if you have MyChart) OR A paper copy in the mail If you have any lab test that is abnormal or we need to change your treatment, we will call you to review the results.  Testing/Procedures: none  Follow-Up: At [facility], you and your health needs are our priority.  As part of our continuing mission to provide you with exceptional heart care, our providers are all part of one team.  This team includes your primary Cardiologist (physician) and Advanced Practice Providers or APPs (Physician Assistants and Nurse Practitioners) who all work together to provide you with the care you need, when you need it.  Your next appointment:   1 month(s)  Provider:   Lonni LITTIE Nanas, MD    We recommend signing up for the patient portal called MyChart.  Sign up information is provided on this After Visit Summary.  MyChart is used to connect with patients for Virtual Visits (Telemedicine).  Patients are able to view lab/test results, encounter notes, upcoming appointments, etc.  Non-urgent messages can be sent to your provider as well.   To learn more about what you can do with MyChart, go to [email or web address].   Other Instructions Please let us  know when you arrive at Emergency Room

## 2023-11-20 NOTE — ED Provider Notes (Signed)
 [place] EMERGENCY DEPARTMENT AT [facility] Provider Note   CSN: [id number] Arrival date & time: 11/20/23  1105     Patient presents with: Dizziness   Jillian Rodgers is a 78 y.o. female.   Patient is a 78 year old who presents with fatigue.  She has a history of atrial fibrillation, chronic thrombocytopenia, prior TIA, hypertension, hyperlipidemia.  She had a Watchman procedure July 17 for her atrial fibrillation.  She was deemed not to be a good candidate for anticoagulation given her thrombocytopenia.  She has had some increased fatigue although she was having fatigue even prior to the procedure.  She sleeps most of the day per her husband.  Sometimes she will have some discomfort in her chest and a catch in her breath but no ongoing shortness of breath or chest discomfort.  This happens couple times a week.  She denies any current chest pain.  She did have some recent leg swelling after the procedure.  She was started on Lasix  20 mg a day.  This was increased to 40 mg a day.  She says the swelling has resolved.  Her blood pressures been low at home sometimes in the 80s systolic.  She went to her cardiologist today and noted to be hypotensive.  It was suspected that she may be somewhat volume depleted given the Lasix  usage.  She denies any fevers.  No congestion.  No vomiting or diarrhea.       Prior to Admission medications   Medication Sig Start Date End Date Taking? Authorizing Provider  cephALEXin  (KEFLEX ) 500 MG capsule Take 1 capsule (500 mg total) by mouth 2 (two) times daily. 11/20/23  Yes Lenor Hollering, MD  apixaban  (ELIQUIS ) 5 MG TABS tablet TAKE 1 TABLET(5 MG) BY MOUTH TWICE DAILY 08/14/23   Kate Lonni CROME, MD  Ascorbic Acid (VITAMIN C) 500 MG CAPS Take 500 mg by mouth daily.     [provider]  atorvastatin  (LIPITOR ) 80 MG tablet Take 1 tablet (80 mg total) by mouth daily. 06/15/23   Webb, Padonda B, FNP  BOTOX 100 units SOLR injection Inject 100  Units into the muscle See admin instructions. Inject 100 units intramuscularly every 6-8 weeks 10/21/20   [provider]  calcium  carbonate (SUPER CALCIUM ) 1500 (600 Ca) MG TABS tablet Take 600 mg of elemental calcium  by mouth at bedtime. 07/19/23   [provider]  cetirizine (ZYRTEC) 10 MG tablet Take 10 mg by mouth daily.    [provider]  Cholecalciferol (VITAMIN D3) 125 MCG (5000 UT) TABS Take 5,000 Units by mouth daily.    [provider]  colchicine  0.6 MG tablet Take 1 tablet (0.6 mg total) by mouth 2 (two) times daily for 5 days. Does not need refills, post procedure medication. Patient not taking: Reported on 11/20/2023 11/02/23 11/20/23  Aniceto Daphne CROME, NP  denosumab (PROLIA) 60 MG/ML SOSY injection Inject 60 mg into the skin every 6 (six) months. 07/19/23   [provider]  diltiazem  (CARDIZEM  CD) 180 MG 24 hr capsule Take 1 capsule (180 mg total) by mouth daily. 06/21/23   Kate Lonni CROME, MD  divalproex  (DEPAKOTE  ER) 500 MG 24 hr tablet Take 1 tablet (500 mg total) by mouth daily. 06/15/23   Webb, Padonda B, FNP  eltrombopag  (PROMACTA ) 25 MG tablet Take 1 tablet (25 mg total) by mouth daily. Take on an empty stomach, 1 hour before a meal or 2 hours after. 10/04/23   Iruku, Praveena, MD  furosemide  (LASIX ) 20 MG tablet TAKE 1 TABLET(20 MG) BY MOUTH DAILY AS NEEDED 04/17/23   Wilkinson, Dana E, NP  gabapentin  (NEURONTIN ) 300 MG capsule Take 300 mg (1 tablet) in the morning and 600 mg (2 tablets) at bedtime 06/15/23   Webb, Padonda B, FNP  levothyroxine  (SYNTHROID ) 50 MCG tablet Take  1 tablet  Daily  on an empty stomach with only water for 30 minutes & no Antacid meds, Calcium  or Magnesium  for 4 hours & avoid Biotin                                                                                                                    /                                                                   TAKE                                         BY                                                  MOUTH Patient taking differently: Take 50 mcg by mouth See admin instructions. Take 50 mcg by mouth in the morning on an empty stomach, with only water. Take no antacids, calcium , or magnesium  for 4 hours and avoid biotin. 11/15/22   Tonita Fallow, MD  magnesium  oxide (MAG-OX) 400 (240 Mg) MG tablet Take 1 tablet (400 mg total) by mouth at bedtime. 11/02/23   Aniceto Daphne CROME, NP  Magnesium  Oxide 400 MG CAPS Take 1 capsule (400 mg total) by mouth at bedtime. Patient not taking: Reported on 11/20/2023 01/13/23   Laurence Locus, DO  metoprolol  tartrate (LOPRESSOR ) 25 MG tablet Take 1.5 tablets (37.5 mg total) by mouth 2 (two) times daily. 05/16/23   Kate Lonni CROME, MD  pantoprazole  (PROTONIX ) 40 MG tablet Take 1 tablet (40 mg total) by mouth daily. 11/02/23 12/17/23  Aniceto Daphne CROME, NP  pantoprazole  (PROTONIX ) 40 MG tablet Take  1 tablet  2 x /day  for Acid Indigestion & Heartburn Patient not taking: Reported on 11/20/2023 12/18/23   Aniceto Daphne CROME, NP  sodium chloride  (OCEAN) 0.65 % SOLN nasal spray Place 1 spray into both nostrils as needed for congestion. Patient not taking: Reported on 11/20/2023 03/25/23   Krishnan, Gokul, MD  SYSTANE BALANCE 0.6 % SOLN Place 1 drop into both eyes at bedtime as needed (dry eyes).    [provider]  SYSTANE ULTRA 0.4-0.3 % SOLN Place  1 drop into both eyes daily as needed (dry eyes). Patient not taking: Reported on 11/20/2023    [provider]  TYLENOL  500 MG tablet Take 500-1,000 mg by mouth every 6 (six) hours as needed for mild pain or headache.    [provider]  Zinc 30 MG TABS Take 30 mg by mouth daily.    [provider]    Allergies: Patient has no known allergies.    Review of Systems  Constitutional:  Positive for fatigue. Negative for chills, diaphoresis and fever.  HENT:  Negative for congestion, rhinorrhea and sneezing.   Eyes: Negative.   Respiratory:  Positive for shortness of  breath. Negative for cough and chest tightness.   Cardiovascular:  Positive for chest pain and leg swelling.  Gastrointestinal:  Negative for abdominal pain, diarrhea, nausea and vomiting.  Genitourinary:  Negative for difficulty urinating, flank pain, frequency and hematuria.  Musculoskeletal:  Negative for arthralgias and back pain.  Skin:  Negative for rash.  Neurological:  Negative for dizziness, speech difficulty, weakness, numbness and headaches.    Updated Vital Signs BP (!) 141/98   Pulse 65   Temp 98.1 F (36.7 C) (Oral)   Resp 18   Ht 5' 4 (1.626 m)   Wt 79.4 kg   SpO2 100%   BMI 30.04 kg/m   Physical Exam Constitutional:      Appearance: She is well-developed.  HENT:     Head: Normocephalic and atraumatic.  Eyes:     Pupils: Pupils are equal, round, and reactive to light.  Cardiovascular:     Rate and Rhythm: Normal rate and regular rhythm.     Heart sounds: Normal heart sounds.  Pulmonary:     Effort: Pulmonary effort is normal. No respiratory distress.     Breath sounds: Normal breath sounds. No wheezing or rales.  Chest:     Chest wall: No tenderness.  Abdominal:     General: Bowel sounds are normal.     Palpations: Abdomen is soft.     Tenderness: There is no abdominal tenderness. There is no guarding or rebound.  Musculoskeletal:        General: Normal range of motion.     Cervical back: Normal range of motion and neck supple.     Comments: No edema or calf tenderness  Lymphadenopathy:     Cervical: No cervical adenopathy.  Skin:    General: Skin is warm and dry.     Findings: No rash.  Neurological:     Mental Status: She is alert and oriented to person, place, and time.     (all labs ordered are listed, but only abnormal results are displayed) Labs Reviewed  BASIC METABOLIC PANEL WITH GFR - Abnormal; Notable for the following components:      Result Value   BUN 29 (*)    Creatinine, Ser 1.71 (*)    GFR, Estimated 30 (*)    All other  components within normal limits  CBC - Abnormal; Notable for the following components:   Hemoglobin 11.9 (*)    Platelets 112 (*)    All other components within normal limits  URINALYSIS, W/ REFLEX TO CULTURE (INFECTION SUSPECTED) - Abnormal; Notable for the following components:   Color, Urine STRAW (*)    Leukocytes,Ua MODERATE (*)    Bacteria, UA RARE (*)    All other components within normal limits  URINE CULTURE  TROPONIN I (HIGH SENSITIVITY)  TROPONIN I (HIGH SENSITIVITY)  EKG: EKG Interpretation Date/Time:  Tuesday November 20 2023 11:20:01 EDT Ventricular Rate:  56 PR Interval:  150 QRS Duration:  74 QT Interval:  410 QTC Calculation: 395 R Axis:   38  Text Interpretation: Sinus bradycardia Low voltage QRS Nonspecific T wave abnormality Abnormal ECG When compared with ECG of 20-Nov-2023 10:16, PREVIOUS ECG IS PRESENT Confirmed by Lenor Hollering [phone]) on 11/20/2023 11:58:47 AM  Radiology: [facility] Chest 2 View Result Date: 11/20/2023 CLINICAL DATA:  Chest pain. History of walking pneumonia and atrial fibrillation. EXAM: CHEST - 2 VIEW COMPARISON:  Radiographs 04/18/2023.  Cardiac CT 10/11/2023. FINDINGS: The heart size and mediastinal contours are stable with mild cardiomegaly and aortic atherosclerosis status post interval left atrial appendage occluder placement. There is a moderate size hiatal hernia and calcified left hilar lymph nodes. The lungs appear clear. No pleural effusion or pneumothorax. Degenerative changes in the spine without evidence of acute osseous abnormality. IMPRESSION: No evidence of acute cardiopulmonary process. Interval left atrial appendage occluder placement. Electronically Signed   By: Elsie Perone M.D.   On: 11/20/2023 12:53     Procedures   Medications Ordered in the ED  sodium chloride  0.9 % bolus 500 mL (has no administration in time range)  cephALEXin  (KEFLEX ) capsule 500 mg (has no administration in time range)  sodium chloride  0.9 % bolus  500 mL (500 mLs Intravenous New Bag/Given 11/20/23 1330)                                    Medical Decision Making Amount and/or Complexity of Data Reviewed Labs: ordered. Radiology: ordered.  Risk Prescription drug management.   This patient presents to the ED for concern of fatigue, this involves an extensive number of treatment options, and is a complaint that carries with it a high risk of complications and morbidity.  I considered the following differential and admission for this acute, potentially life threatening condition.  The differential diagnosis includes anemia, dehydration, electrolyte abnormality, ACS, pericardial effusion, infection  MDM:    Patient is a 78 year old who had a recent Watchman procedure.  She is currently on Eliquis .  She has some ongoing fatigue.  No fever.  No focal neurologic deficits that would be more concerning for stroke.  No ongoing chest pain or shortness of breath that would be more concerning for ACS.  She has had some intermittent vague chest pain but none currently.  Her troponins are negative.  Labs show mild elevation in her creatinine which goes along with probable dehydration.  She has stopped her Lasix  today.  She was given some IV fluids here in the ED.  Her blood pressure stabilized.  She is feeling much better.  She is able to ambulate without dizziness.  Her urine is concerning for infection.  It was sent for culture.  I will start her on Keflex .  She had an echocardiogram which showed no evidence of pericardial effusion.  Findings were discussed with Dr. Michele.  Patient will likely be discharged home.   Awaiting final read on echo.  This will be followed up on by Dr. Dean.  Was given prescription for Keflex .  Will have close follow-up with her doctor.  Return precautions were given.  (Labs, imaging, consults)  Labs: I Ordered, and personally interpreted labs.  The pertinent results include: Elevated creatinine, UTI  Imaging Studies  ordered: I ordered imaging studies including dust x-ray I independently visualized and  interpreted imaging. I agree with the radiologist interpretation  Additional history obtained from husband.  External records from outside source obtained and reviewed including history  Cardiac Monitoring: The patient was maintained on a cardiac monitor.  If on the cardiac monitor, I personally viewed and interpreted the cardiac monitored which showed an underlying rhythm of: Sinus rhythm  Reevaluation: After the interventions noted above, I reevaluated the patient and found that they have :improved  Social Determinants of Health:    Disposition: Discharged to home  Co morbidities that complicate the patient evaluation  Past Medical History:  Diagnosis Date   Anemia with low platelet count (HCC)    Atrial fibrillation (HCC) 2022   Chronic kidney disease    Degenerative joint disease    Dysrhythmia    a fib   GERD (gastroesophageal reflux disease)    History of kidney stones    Hyperlipidemia    Hypothyroidism    Inguinal hernia 02/19/2019   Labile hypertension    Migraines    neurontin  helps   Pneumonia    hx of walking pneumonia    Primary hypertension 04/28/2022     Medicines Meds ordered this encounter  Medications   sodium chloride  0.9 % bolus 500 mL   sodium chloride  0.9 % bolus 500 mL   cephALEXin  (KEFLEX ) capsule 500 mg   cephALEXin  (KEFLEX ) 500 MG capsule    Sig: Take 1 capsule (500 mg total) by mouth 2 (two) times daily.    Dispense:  14 capsule    Refill:  0    I have reviewed the patients home medicines and have made adjustments as needed  Problem List / ED Course: Problem List Items Addressed This Visit       Genitourinary   Acute cystitis without hematuria   Other Visit Diagnoses       Dizziness    -  Primary     Dehydration                    Final diagnoses:  Dizziness  Dehydration  Acute cystitis without hematuria    ED Discharge  Orders          Ordered    cephALEXin  (KEFLEX ) 500 MG capsule  2 times daily        11/20/23 1601               Lenor Hollering, MD 11/20/23 1626

## 2023-11-21 ENCOUNTER — Telehealth: Payer: Self-pay | Admitting: *Deleted

## 2023-11-21 DIAGNOSIS — E861 Hypovolemia: Secondary | ICD-10-CM

## 2023-11-21 DIAGNOSIS — E860 Dehydration: Secondary | ICD-10-CM

## 2023-11-21 LAB — URINE CULTURE: Culture: NO GROWTH

## 2023-11-21 NOTE — Telephone Encounter (Signed)
 Pt called regarding pharmacy not receiving Rx as stated on After Visit Summary (AVS).  RNCM called in Rx as written to [facility] [phone].  E-Prescribing Status: Transmission to pharmacy failed (11/20/2023  4:06 PM EDT   Damien Batty J. Debarah, BSN, RN, [facility]  IP Care Management  Nurse Case Manager  [facility] Emergency Departments  Operative Services  [phone]

## 2023-11-27 ENCOUNTER — Other Ambulatory Visit: Payer: Self-pay

## 2023-11-29 ENCOUNTER — Other Ambulatory Visit: Payer: Self-pay

## 2023-12-03 ENCOUNTER — Other Ambulatory Visit: Payer: Self-pay

## 2023-12-09 NOTE — Progress Notes (Unsigned)
 Cardiology Office Note:    Date:  12/12/2023   ID:  Jillian Rodgers, DOB 03-Jan-1946, MRN [id number]  PCP:  [name], MD  Cardiologist:  Lonni LITTIE Nanas, MD  Electrophysiologist:  OLE ONEIDA HOLTS, MD   Referring MD: [name], MD   Chief Complaint  Patient presents with   Atrial Fibrillation    History of Present Illness:    Jillian Rodgers is a 78 y.o. female with a hx of paroxysmal atrial fibrillation, chronic thrombocytopenia, TIA, CKD stage III, hypertension, hypothyroidism, hyperlipidemia who presents for follow-up.  She was diagnosed with A-fib 10/2020, underwent successful DCCV at that time.  She was taken off anticoagulation due to chronic soft thrombocytopenia.  Admitted 10/2022 with TIA.  She was admitted 12/2022 with A-fib with RVR.  During admission, her platelets were stable in 60s and in discussion with hematology she tolerated heparin  drip she was transitioned to Eliquis .  She spontaneously converted to normal sinus rhythm during admission and was discharged on Eliquis  and metoprolol .  At follow-up in 01/2023, her platelet count had declined to 30s and Eliquis  was held.  Underwent steroid treatment for thrombocytopenia on 02/2023 and IVIG 03/2023 with improvement in platelet count and restarted on Eliquis .  She was admitted 03/2023 with recurrent A-fib with RVR, was continued on Eliquis  5 mg twice daily, and she spontaneously converted to sinus rhythm during admission.  She was seen by Dr. HOLTS on 04/17/2023, planning A-fib ablation/Watchman procedure.  She was admitted with recurrent A-fib 04/18/2023.  She underwent A-fib ablation and watchman placement on 11/01/2023.  Since last clinic visit, she reports she is doing okay.  States that lightheadedness has resolved. Has taken Lasix  once since last clinic visit for lower extremity edema. Denies any chest pain, dyspnea, lightheadedness, syncope, or palpitations.   Wt Readings from Last 3 Encounters:   12/12/23 160 lb (72.6 kg)  11/20/23 175 lb (79.4 kg)  11/20/23 157 lb 9.6 oz (71.5 kg)     Past Medical History:  Diagnosis Date   Anemia with low platelet count (HCC)    Atrial fibrillation (HCC) 2022   Chronic kidney disease    Degenerative joint disease    Dysrhythmia    a fib   GERD (gastroesophageal reflux disease)    History of kidney stones    Hyperlipidemia    Hypothyroidism    Inguinal hernia 02/19/2019   Labile hypertension    Migraines    neurontin  helps   Pneumonia    hx of walking pneumonia    Primary hypertension 04/28/2022    Past Surgical History:  Procedure Laterality Date   ATRIAL FIBRILLATION ABLATION N/A 11/01/2023   Procedure: ATRIAL FIBRILLATION ABLATION;  Surgeon: HOLTS OLE ONEIDA, MD;  Location: [facility];  Service: Cardiovascular;  Laterality: N/A;   BILATERAL CATARACT SURGERY      BLERPHOROPLASTY \     CHOLECYSTECTOMY  1996   open   COLONOSCOPY  2009   recommended 10 year f/u   FOOT SURGERY Right    HERNIA REPAIR  1985   umb hernia rpr   HERNIA REPAIR  2005 & 2009   ventral hernia rpr   INGUINAL HERNIA REPAIR Left 04/30/2020   Procedure: OPEN LEFT INGUINAL HERNIA REPAIR WITH MESH;  Surgeon: Signe Mitzie DELENA, MD;  Location: [facility];  Service: General;  Laterality: Left;   INGUINAL HERNIA REPAIR Right 10/24/2021   Procedure: OPEN RIGHT INGUINAL HERNIA REPAIR WITH MESH ;  Surgeon: Signe Mitzie DELENA, MD;  Location: [facility];  Service: General;  Laterality: Right;   LEFT ATRIAL APPENDAGE OCCLUSION N/A 11/01/2023   Procedure: LEFT ATRIAL APPENDAGE OCCLUSION;  Surgeon: Cindie Ole DASEN, MD;  Location: [facility];  Service: Cardiovascular;  Laterality: N/A;   PILONIDAL CYST EXCISION  1972   SKIN CANCER REMOVAL      TONSILLECTOMY     TRANSESOPHAGEAL ECHOCARDIOGRAM (CATH LAB) N/A 11/01/2023   Procedure: TRANSESOPHAGEAL ECHOCARDIOGRAM;  Surgeon: Cindie Ole DASEN, MD;  Location: [facility];  Service: Cardiovascular;   Laterality: N/A;   UPPER GI ENDOSCOPY     VENTRAL HERNIA REPAIR      Current Medications: Current Meds  Medication Sig   apixaban  (ELIQUIS ) 5 MG TABS tablet TAKE 1 TABLET(5 MG) BY MOUTH TWICE DAILY   Ascorbic Acid (VITAMIN C) 500 MG CAPS Take 500 mg by mouth daily.    atorvastatin  (LIPITOR ) 80 MG tablet Take 1 tablet (80 mg total) by mouth daily.   BOTOX 100 units SOLR injection Inject 100 Units into the muscle See admin instructions. Inject 100 units intramuscularly every 6-8 weeks   calcium  carbonate (SUPER CALCIUM ) 1500 (600 Ca) MG TABS tablet Take 600 mg of elemental calcium  by mouth at bedtime.   cetirizine (ZYRTEC) 10 MG tablet Take 10 mg by mouth daily.   Cholecalciferol (VITAMIN D3) 125 MCG (5000 UT) TABS Take 5,000 Units by mouth daily.   denosumab (PROLIA) 60 MG/ML SOSY injection Inject 60 mg into the skin every 6 (six) months.   diltiazem  (CARDIZEM  CD) 180 MG 24 hr capsule Take 1 capsule (180 mg total) by mouth daily.   divalproex  (DEPAKOTE  ER) 500 MG 24 hr tablet Take 1 tablet (500 mg total) by mouth daily.   eltrombopag  (PROMACTA ) 25 MG tablet Take 1 tablet (25 mg total) by mouth daily. Take on an empty stomach, 1 hour before a meal or 2 hours after.   furosemide  (LASIX ) 20 MG tablet TAKE 1 TABLET(20 MG) BY MOUTH DAILY AS NEEDED   gabapentin  (NEURONTIN ) 300 MG capsule Take 300 mg (1 tablet) in the morning and 600 mg (2 tablets) at bedtime   levothyroxine  (SYNTHROID ) 50 MCG tablet Take  1 tablet  Daily  on an empty stomach with only water for 30 minutes & no Antacid meds, Calcium  or Magnesium  for 4 hours & avoid Biotin                                                                                                                    /                                                                   TAKE  BY                                                 MOUTH (Patient taking differently: Take 50 mcg by mouth See admin instructions. Take 50 mcg  by mouth in the morning on an empty stomach, with only water. Take no antacids, calcium , or magnesium  for 4 hours and avoid biotin.)   magnesium  oxide (MAG-OX) 400 (240 Mg) MG tablet Take 1 tablet (400 mg total) by mouth at bedtime.   metoprolol  tartrate (LOPRESSOR ) 25 MG tablet Take 1.5 tablets (37.5 mg total) by mouth 2 (two) times daily.   pantoprazole  (PROTONIX ) 40 MG tablet Take 1 tablet (40 mg total) by mouth daily.   SYSTANE BALANCE 0.6 % SOLN Place 1 drop into both eyes at bedtime as needed (dry eyes).   TYLENOL  500 MG tablet Take 500-1,000 mg by mouth every 6 (six) hours as needed for mild pain or headache.   Zinc 30 MG TABS Take 30 mg by mouth daily.     Allergies:   Patient has no known allergies.   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  Food Insecurity: No Food Insecurity (11/01/2023)   Hunger Vital Sign    Worried About Running Out of Food in the Last Year: Never true    Ran Out of Food in the Last Year: Never true  Transportation Needs: No Transportation Needs (11/01/2023)   PRAPARE - Administrator, Civil Service (Medical): No    Lack of Transportation (Non-Medical): No  Physical Activity: Not on file  Stress: Not on file  Social Connections: Unknown (11/01/2023)   Social Connection and Isolation Panel    Frequency of Communication with Friends and Family: Never    Frequency of Social Gatherings with Friends and Family: Never     Attends Religious Services: Never    Database administrator or Organizations: No    Attends Engineer, structural: Never    Marital Status: Patient declined     Family History: The patient's family history includes Breast cancer in her sister; Cancer in her mother; Congestive Heart Failure in her father; Gout in her maternal aunt; Heart disease in her mother; Hypertension in her mother; Ovarian cancer in her mother; Stroke in her paternal uncle; Uterine cancer in her mother. There is no history of Colon cancer, Esophageal cancer, Stomach cancer, or Rectal cancer.  ROS:   Please see the history of present illness.     All other systems reviewed and are negative.  EKGs/Labs/Other Studies Reviewed:    The following studies were reviewed today:   EKG:   11/20/2023: Sinus bradycardia, rate 59, nonspecific T  wave flattening 04/23/2023: Sinus bradycardia, rate 55, no ST abnormalities 07/25/23: Normal sinus rhythm, rate 69, no ST abnormalities  Recent Labs: 03/23/2023: TSH 3.248 03/25/2023: Magnesium  2.2 04/18/2023: B Natriuretic Peptide 721.8 10/04/2023: ALT 12 11/20/2023: BUN 29; Creatinine, Ser 1.71; Hemoglobin 11.9; Platelets 112; Potassium 4.1; Sodium 141  Recent Lipid Panel    Component Value Date/Time   CHOL 143 01/03/2023 1533   TRIG 94 01/03/2023 1533   HDL 72 01/03/2023 1533   CHOLHDL 2.0 01/03/2023 1533   VLDL 34 (H) 09/26/2016 1147   LDLCALC 53 01/03/2023 1533    Physical Exam:    VS:  BP 112/70   Pulse (!) 56   Ht 5' 4 (1.626 m)   Wt 160 lb (72.6 kg)   SpO2 95%   BMI 27.46 kg/m     Wt Readings from Last 3 Encounters:  12/12/23 160 lb (72.6 kg)  11/20/23 175 lb (79.4 kg)  11/20/23 157 lb 9.6 oz (71.5 kg)     GEN:  Well nourished, well developed in no acute distress HEENT: Normal NECK: No JVD; No carotid bruits LYMPHATICS: No lymphadenopathy CARDIAC: RRR, no murmurs, rubs, gallops RESPIRATORY:  Clear to auscultation without rales, wheezing or rhonchi   ABDOMEN: Soft, non-tender, non-distended MUSCULOSKELETAL:  trace edema SKIN: Warm and dry NEUROLOGIC:  Alert and oriented x 3 PSYCHIATRIC:  Normal affect   ASSESSMENT:    1. Paroxysmal atrial fibrillation (HCC)   2. Primary hypertension   3. Lightheadedness   4. AKI (acute kidney injury) (HCC)   5. OSA (obstructive sleep apnea)       PLAN:    Lightheadedness/hypotension/AKI: Sent to ED from clinic 11/20/2023, SBP down to 80s.  Suspect due to dehydration from taking Lasix  daily since her ablation.  Labs notable for AKI with creatinine up to 1.71.  Lasix  changed to as needed.  BP significantly improved.  Reports symptoms have resolved. - Check BMET  Paroxysmal atrial fibrillation: Complicated situation, has had recurrent admissions for A-fib with RVR but has been unable to tolerate anticoagulation despite high CHA2DS2-VASc score (6, hypertension, age x 2, TIA, female) due to chronic thrombocytopenia.  Echocardiogram 12/2022 showed EF 60 to 65%, normal RV function, severe left atrial enlargement, no significant valvular disease.  She underwent A-fib ablation and watchman placement on 11/01/2023. -Continue Eliquis  5 mg twice daily for now, will need to complete 90 days of anticoagulation post Watchman -Continue metoprolol  37.5 mg twice daily and cardizem  180 mg daily  Chronic thrombocytopenia: Follows with hematology, likely ITP.  Treated with steroids 02/2023 and IVIG 03/2023, with improvement in counts.  Platelets were again down to 39 on 06/18/2023.  Underwent additional IVIG treatment and most recent platelets 95 on 10/31/2023  Lower extremity edema: On as needed Lasix  20 mg  Hypertension: On metoprolol  and diltiazem   TIA: Continue atorvastatin , Eliquis   OSA: Has known OSA but has not been on CPAP.  Itamar ordered last year but states she was not called to activate device.  Will discuss with sleep coordinator  RTC in 4 months   Medication Adjustments/Labs and Tests Ordered: Current  medicines are reviewed at length with the patient today.  Concerns regarding medicines are outlined above.  Orders Placed This Encounter  Procedures   CBC   Basic metabolic panel with GFR   No orders of the defined types were placed in this encounter.   Patient Instructions   Lab Work: BMP CBC  If you have labs (blood work) drawn today and your tests are completely  normal, you will receive your results only by: MyChart Message (if you have MyChart) OR A paper copy in the mail If you have any lab test that is abnormal or we need to change your treatment, we will call you to review the results.   Follow-Up: At [facility], you and your health needs are our priority.  As part of our continuing mission to provide you with exceptional heart care, our providers are all part of one team.  This team includes your primary Cardiologist (physician) and Advanced Practice Providers or APPs (Physician Assistants and Nurse Practitioners) who all work together to provide you with the care you need, when you need it.  Your next appointment:   4 month(s)  Provider:   Lonni LITTIE Nanas, MD       Signed, Lonni LITTIE Nanas, MD  12/12/2023 2:57 PM    [facility]

## 2023-12-11 ENCOUNTER — Other Ambulatory Visit: Payer: Self-pay

## 2023-12-11 ENCOUNTER — Other Ambulatory Visit (HOSPITAL_COMMUNITY): Payer: Self-pay

## 2023-12-11 NOTE — Progress Notes (Signed)
 Specialty Pharmacy Refill Coordination Note  Jillian Rodgers is a 78 y.o. female contacted today regarding refills of specialty medication(s) Eltrombopag  Olamine (PROMACTA )   Patient requested Delivery   Delivery date: 12/12/23   Verified address: [address]  [place] [address]   Medication will be filled on 12/11/23.

## 2023-12-12 ENCOUNTER — Ambulatory Visit: Attending: Cardiovascular Disease | Admitting: Cardiology

## 2023-12-12 VITALS — BP 112/70 | HR 56 | Ht 64.0 in | Wt 160.0 lb

## 2023-12-12 DIAGNOSIS — G4733 Obstructive sleep apnea (adult) (pediatric): Secondary | ICD-10-CM

## 2023-12-12 DIAGNOSIS — I480 Paroxysmal atrial fibrillation: Secondary | ICD-10-CM

## 2023-12-12 DIAGNOSIS — R42 Dizziness and giddiness: Secondary | ICD-10-CM

## 2023-12-12 DIAGNOSIS — N179 Acute kidney failure, unspecified: Secondary | ICD-10-CM | POA: Diagnosis not present

## 2023-12-12 DIAGNOSIS — I10 Essential (primary) hypertension: Secondary | ICD-10-CM

## 2023-12-12 NOTE — Patient Instructions (Signed)
  Lab Work: BMP CBC  If you have labs (blood work) drawn today and your tests are completely normal, you will receive your results only by: MyChart Message (if you have MyChart) OR A paper copy in the mail If you have any lab test that is abnormal or we need to change your treatment, we will call you to review the results.   Follow-Up: At [facility], you and your health needs are our priority.  As part of our continuing mission to provide you with exceptional heart care, our providers are all part of one team.  This team includes your primary Cardiologist (physician) and Advanced Practice Providers or APPs (Physician Assistants and Nurse Practitioners) who all work together to provide you with the care you need, when you need it.  Your next appointment:   4 month(s)  Provider:   Lonni LITTIE Nanas, MD

## 2023-12-12 NOTE — Progress Notes (Unsigned)
  Electrophysiology Office Note:   Date:  12/13/2023  ID:  Jillian Rodgers, DOB 01-25-1946, MRN [id number]  Primary Cardiologist: Lonni LITTIE Nanas, MD Primary Heart Failure: None Electrophysiologist: OLE ONEIDA HOLTS, MD      History of Present Illness:   Jillian Rodgers is a 78 y.o. female with h/o AF, HTN, HLD, TIA, hypothyroidism, GERD, IDA, CKD IIIa seen today for routine electrophysiology follow up > 6 weeks post LAAO / AF ablation.   Since last being seen in our clinic the patient reports doing well. She has not had evidence of AF. She has not missed doses of OAC, no bleeding issues.    She denies chest pain, palpitations, dyspnea, PND, orthopnea, nausea, vomiting, dizziness, syncope, edema, weight gain, or early satiety.   Review of systems complete and found to be negative unless listed in HPI.   EP Information / Studies Reviewed:    EKG is not ordered today. EKG from 11/21/23  reviewed which showed SB 56 bpm       Arrhythmia / AAD / Pertinent EP Studies AF  EPS / LAAO 11/01/23 > PVI ablation, ablation of posterior wall, implant of Watchman 24 mm device   Risk Assessment/Calculations:    CHA2DS2-VASc Score = 6   This indicates a 9.7% annual risk of stroke. The patient's score is based upon: CHF History: 0 HTN History: 1 Diabetes History: 0 Stroke History: 2 Vascular Disease History: 0 Age Score: 2 Gender Score: 1        STOP-Bang Score:  3       Physical Exam:   VS:  BP 120/80   Pulse (!) 56   Ht 5' 4 (1.626 m)   Wt 157 lb 6.4 oz (71.4 kg)   BMI 27.02 kg/m    Wt Readings from Last 3 Encounters:  12/13/23 157 lb 6.4 oz (71.4 kg)  12/12/23 160 lb (72.6 kg)  11/20/23 175 lb (79.4 kg)     GEN: Well nourished, well developed in no acute distress NECK: No JVD; No carotid bruits CARDIAC: Regular rate and rhythm, no murmurs, rubs, gallops RESPIRATORY:  Clear to auscultation without rales, wheezing or rhonchi  ABDOMEN: Soft, non-tender,  non-distended EXTREMITIES:  No edema; No deformity   ASSESSMENT AND PLAN:    Persistent Atrial Fibrillation  Presence of Watchman Device / LAAO Chronic Thrombocytopenia  CHA2DS2-VASc 6, s/p PVI + PW ablation, LAAO 24 mm device 11/01/23  -post implant anticoagulation strategy > Eliquis  5mg  by mouth twice daily for 45 days (through 12/16/23). After 45 days, stop Eliquis  and start Plavix  75mg  by mouth once daily to complete 6 months (through 05/03/2024) of post implant medical therapy  -assess CT to review Watchman patency, position  -pre-CT BMP completed on 8/27 -continue metoprolol  37.5 BID   -diltiazem  CD 180 mg daily   -discussed no medication changes for now to metoprolol  / diltiazem  until next visit  -no symptom burden post ablation -dental SBE with amoxicillin    Hypertension  HLD -well controlled on current regimen   -anticipate transition to ASA 81 mg at end of 6 months post Watchman implant but will review with Dr. HOLTS given her thrombocytopenia   Hx TIA -per primary   Follow up with Dr. HOLTS / EP APP 05/04/23     Signed, Daphne Barrack, NP-C, AGACNP-BC [facility] - Electrophysiology  12/13/2023, 1:41 PM

## 2023-12-12 NOTE — Telephone Encounter (Addendum)
 Ordering provider: DR [email or web address] Associated diagnoses: G47.33 WatchPAT PA obtained on 12/12/2023 by Joshua Dalton Seip, CMA. Authorization: No; tracking ID NO AUTH REQ Patient notified of PIN ([id number]) on 12/12/2023 via Notification Method: phone.  PATIENT DECLINED TESTING-  She wants to return the device on her appt tomorrow 12/13/23

## 2023-12-13 ENCOUNTER — Telehealth: Payer: Self-pay | Admitting: Radiology

## 2023-12-13 ENCOUNTER — Ambulatory Visit: Payer: Self-pay | Admitting: Cardiology

## 2023-12-13 ENCOUNTER — Encounter: Payer: Self-pay | Admitting: Pulmonary Disease

## 2023-12-13 ENCOUNTER — Ambulatory Visit: Attending: Cardiology | Admitting: Pulmonary Disease

## 2023-12-13 VITALS — BP 120/80 | HR 56 | Ht 64.0 in | Wt 157.4 lb

## 2023-12-13 DIAGNOSIS — D696 Thrombocytopenia, unspecified: Secondary | ICD-10-CM

## 2023-12-13 DIAGNOSIS — I4819 Other persistent atrial fibrillation: Secondary | ICD-10-CM

## 2023-12-13 DIAGNOSIS — I10 Essential (primary) hypertension: Secondary | ICD-10-CM | POA: Diagnosis not present

## 2023-12-13 DIAGNOSIS — I480 Paroxysmal atrial fibrillation: Secondary | ICD-10-CM

## 2023-12-13 DIAGNOSIS — Z95818 Presence of other cardiac implants and grafts: Secondary | ICD-10-CM

## 2023-12-13 DIAGNOSIS — E782 Mixed hyperlipidemia: Secondary | ICD-10-CM

## 2023-12-13 LAB — BASIC METABOLIC PANEL WITH GFR
BUN/Creatinine Ratio: 19 (ref 12–28)
BUN: 24 mg/dL (ref 8–27)
CO2: 26 mmol/L (ref 20–29)
Calcium: 10.2 mg/dL (ref 8.7–10.3)
Chloride: 99 mmol/L (ref 96–106)
Creatinine, Ser: 1.26 mg/dL — ABNORMAL HIGH (ref 0.57–1.00)
Glucose: 77 mg/dL (ref 70–99)
Potassium: 4.4 mmol/L (ref 3.5–5.2)
Sodium: 140 mmol/L (ref 134–144)
eGFR: 43 mL/min/1.73 — ABNORMAL LOW (ref >59)

## 2023-12-13 LAB — CBC
Hematocrit: 34.4 % (ref 34.0–46.6)
Hemoglobin: 11 g/dL — ABNORMAL LOW (ref 11.1–15.9)
MCH: 30.4 pg (ref 26.6–33.0)
MCHC: 32 g/dL (ref 31.5–35.7)
MCV: 95 fL (ref 79–97)
Platelets: 88 x10E3/uL — CL (ref 150–450)
RBC: 3.62 x10E6/uL — ABNORMAL LOW (ref 3.77–5.28)
RDW: 13.1 % (ref 11.7–15.4)
WBC: 6.9 x10E3/uL (ref 3.4–10.8)

## 2023-12-13 MED ORDER — AMOXICILLIN 500 MG PO TABS: 2000.0000 mg | ORAL_TABLET | ORAL | 1 refills | Status: AC | PRN

## 2023-12-13 MED ORDER — CLOPIDOGREL BISULFATE 75 MG PO TABS: 75.0000 mg | ORAL_TABLET | Freq: Every day | ORAL | 1 refills | Status: DC

## 2023-12-13 MED ORDER — APIXABAN 5 MG PO TABS: 5.0000 mg | ORAL_TABLET | Freq: Two times a day (BID) | ORAL | Status: DC

## 2023-12-13 NOTE — Patient Instructions (Signed)
 Medication Instructions:  1.Continue eliquis , last dose evening of 12/16/23 2.Start plavix  75 mg daily on 12/17/23 3.Take amoxicillin , 4 tablets (2,000 mg total) by mouth as needed (1 hr prior to dental procedures)  *If you need a refill on your cardiac medications before your next appointment, please call your pharmacy*  Lab Work: None ordered If you have labs (blood work) drawn today and your tests are completely normal, you will receive your results only by: MyChart Message (if you have MyChart) OR A paper copy in the mail If you have any lab test that is abnormal or we need to change your treatment, we will call you to review the results.  Testing/Procedures: See letter  Follow-Up: At [facility], you and your health needs are our priority.  As part of our continuing mission to provide you with exceptional heart care, our providers are all part of one team.  This team includes your primary Cardiologist (physician) and Advanced Practice Providers or APPs (Physician Assistants and Nurse Practitioners) who all work together to provide you with the care you need, when you need it.  Your next appointment:   05/02/24, our provider schedule is not open at this time for January, you will be receiving a call to schedule this appointment.

## 2023-12-13 NOTE — Telephone Encounter (Signed)
 Patient returned WatchPat device to office serial # [id number], Device has been unregistered from website

## 2023-12-24 ENCOUNTER — Other Ambulatory Visit: Payer: Self-pay

## 2023-12-26 ENCOUNTER — Other Ambulatory Visit: Payer: Self-pay

## 2023-12-28 ENCOUNTER — Other Ambulatory Visit: Payer: Self-pay

## 2023-12-28 NOTE — Progress Notes (Signed)
 Specialty Pharmacy Ongoing Clinical Assessment Note  Jillian Rodgers is a 78 y.o. female who is being followed by the specialty pharmacy service for RxSp Oncology   Patient's specialty medication(s) reviewed today: Eltrombopag  Olamine (PROMACTA )   Missed doses in the last 4 weeks: 0   Patient/Caregiver did not have any additional questions or concerns.   Therapeutic benefit summary: Patient is achieving benefit   Adverse events/side effects summary: No adverse events/side effects   Patient's therapy is appropriate to: Continue    Goals Addressed             This Visit's Progress    Maintain optimal adherence to therapy   On track    Patient is on track. Patient will maintain adherence         Follow up: 3 months  [name] Specialty Pharmacist

## 2023-12-28 NOTE — Progress Notes (Signed)
 Specialty Pharmacy Refill Coordination Note  Jillian Rodgers is a 78 y.o. female contacted today regarding refills of specialty medication(s) Eltrombopag  Olamine (PROMACTA )   Patient requested Delivery   Delivery date: 01/10/24   Verified address: [address]  [place] [address]   Medication will be filled on 01/09/24.

## 2024-01-03 ENCOUNTER — Ambulatory Visit: Payer: Self-pay | Admitting: Cardiology

## 2024-01-03 ENCOUNTER — Ambulatory Visit (HOSPITAL_COMMUNITY)
Admission: RE | Admit: 2024-01-03 | Discharge: 2024-01-03 | Disposition: A | Discharge status: Home / Self Care | Source: Ambulatory Visit | Attending: Internal Medicine | Admitting: Internal Medicine

## 2024-01-03 DIAGNOSIS — I4819 Other persistent atrial fibrillation: Secondary | ICD-10-CM | POA: Insufficient documentation

## 2024-01-03 DIAGNOSIS — D696 Thrombocytopenia, unspecified: Secondary | ICD-10-CM | POA: Diagnosis not present

## 2024-01-03 DIAGNOSIS — R918 Other nonspecific abnormal finding of lung field: Secondary | ICD-10-CM | POA: Diagnosis not present

## 2024-01-03 DIAGNOSIS — K449 Diaphragmatic hernia without obstruction or gangrene: Secondary | ICD-10-CM | POA: Insufficient documentation

## 2024-01-03 DIAGNOSIS — I517 Cardiomegaly: Secondary | ICD-10-CM | POA: Diagnosis not present

## 2024-01-03 DIAGNOSIS — Z95818 Presence of other cardiac implants and grafts: Secondary | ICD-10-CM | POA: Diagnosis not present

## 2024-01-03 MED ORDER — IOHEXOL 350 MG/ML SOLN
80.0000 mL | Freq: Once | INTRAVENOUS | Status: AC | PRN
Start: 2024-01-03 — End: 2024-01-03
  Administered 2024-01-03: 80 mL via INTRAVENOUS

## 2024-01-04 ENCOUNTER — Other Ambulatory Visit: Payer: Self-pay

## 2024-01-04 ENCOUNTER — Other Ambulatory Visit (HOSPITAL_COMMUNITY): Payer: Self-pay

## 2024-01-04 ENCOUNTER — Inpatient Hospital Stay: Admitting: Hematology and Oncology

## 2024-01-04 ENCOUNTER — Inpatient Hospital Stay: Attending: Hematology and Oncology

## 2024-01-04 VITALS — BP 96/59 | HR 62 | Temp 99.2°F | Resp 15 | Wt 162.2 lb

## 2024-01-04 DIAGNOSIS — D693 Immune thrombocytopenic purpura: Secondary | ICD-10-CM | POA: Insufficient documentation

## 2024-01-04 DIAGNOSIS — Z808 Family history of malignant neoplasm of other organs or systems: Secondary | ICD-10-CM | POA: Diagnosis not present

## 2024-01-04 DIAGNOSIS — Z803 Family history of malignant neoplasm of breast: Secondary | ICD-10-CM | POA: Insufficient documentation

## 2024-01-04 DIAGNOSIS — D696 Thrombocytopenia, unspecified: Secondary | ICD-10-CM

## 2024-01-04 DIAGNOSIS — Z8041 Family history of malignant neoplasm of ovary: Secondary | ICD-10-CM | POA: Diagnosis not present

## 2024-01-04 LAB — CMP (CANCER CENTER ONLY)
ALT: 15 U/L (ref 0–44)
AST: 21 U/L (ref 15–41)
Albumin: 3.8 g/dL (ref 3.5–5.0)
Alkaline Phosphatase: 56 U/L (ref 38–126)
Anion gap: 7 (ref 5–15)
BUN: 22 mg/dL (ref 8–23)
CO2: 30 mmol/L (ref 22–32)
Calcium: 9.9 mg/dL (ref 8.9–10.3)
Chloride: 103 mmol/L (ref 98–111)
Creatinine: 1.37 mg/dL — ABNORMAL HIGH (ref 0.44–1.00)
GFR, Estimated: 39 mL/min — ABNORMAL LOW (ref >60)
Glucose, Bld: 122 mg/dL — ABNORMAL HIGH (ref 70–99)
Potassium: 3.8 mmol/L (ref 3.5–5.1)
Sodium: 140 mmol/L (ref 135–145)
Total Bilirubin: 0.6 mg/dL (ref 0.0–1.2)
Total Protein: 6.8 g/dL (ref 6.5–8.1)

## 2024-01-04 LAB — CBC WITH DIFFERENTIAL/PLATELET
Abs Immature Granulocytes: 0.08 K/uL — ABNORMAL HIGH (ref 0.00–0.07)
Basophils Absolute: 0 K/uL (ref 0.0–0.1)
Basophils Relative: 1 %
Eosinophils Absolute: 0.3 K/uL (ref 0.0–0.5)
Eosinophils Relative: 5 %
HCT: 35 % — ABNORMAL LOW (ref 36.0–46.0)
Hemoglobin: 11.3 g/dL — ABNORMAL LOW (ref 12.0–15.0)
Immature Granulocytes: 1 %
Lymphocytes Relative: 16 %
Lymphs Abs: 1 K/uL (ref 0.7–4.0)
MCH: 29.7 pg (ref 26.0–34.0)
MCHC: 32.3 g/dL (ref 30.0–36.0)
MCV: 92.1 fL (ref 80.0–100.0)
Monocytes Absolute: 0.9 K/uL (ref 0.1–1.0)
Monocytes Relative: 15 %
Neutro Abs: 3.9 K/uL (ref 1.7–7.7)
Neutrophils Relative %: 62 %
Platelets: 78 K/uL — ABNORMAL LOW (ref 150–400)
RBC: 3.8 MIL/uL — ABNORMAL LOW (ref 3.87–5.11)
RDW: 13.4 % (ref 11.5–15.5)
WBC: 6.3 K/uL (ref 4.0–10.5)
nRBC: 0 % (ref 0.0–0.2)

## 2024-01-04 MED ORDER — ELTROMBOPAG OLAMINE 25 MG PO TABS
25.0000 mg | ORAL_TABLET | Freq: Every day | ORAL | 0 refills | Status: DC
  Filled 2024-01-04: qty 90, 90d supply, fill #0

## 2024-01-04 NOTE — Progress Notes (Signed)
 [facility] Cancer Follow up:    Jillian Ezekiel NOVAK, MD [address] [place] [address] [place] [address]   DIAGNOSIS: Thrombocytopenia  SUMMARY OF HEMATOLOGIC HISTORY:  Underwent thrombocytopenia lab evaluation inn November 2021 without no evidence of hepatitis, hypothyroidism, nutritional deficiencies.  MR of the abdomen from May 2021 shows no evidence of liver disease or splenomegaly.  Bone marrow biopsy from 03/19/2020 showed hypercellularity, no evidence of dyspoeisis, megakaryocytic hyperplasia. Flow cytometry negative for any monoclonal B cell population. Cytogenetics were normal.  Bone marrow biopsy 02/13/2023, overall normocellular marrow with orderly trilineage hematopoiesis and megakaryocytic hyperplasia. 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 Cytogenetics showed normal female karyotype.  Steroids 02/22/2023 which seemed to improve the platelet count but led to a slump in energy levels after discontinuation-minimal improvement in platelet count IVIG 03/19/2023 and 03/22/2023  CURRENT THERAPY: Observation  INTERVAL HISTORY  Discussed the use of AI scribe software for clinical note transcription with the patient, who gave verbal consent to proceed.  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  She has a history of thrombocytopenia and is currently being treated with Promacta , which she has been taking daily since June. Her platelet count today is 78,000, an improvement from previous counts in the twenties and thirties, and she has been maintaining an average platelet count of 75,000 to 80,000. She reports no significant side effects from Promacta , although she occasionally experiences stomach issues, which she has had throughout her life and not specifically  linked to the medication.  She experiences shortness of breath recently, but her hemoglobin level is stable at 11.3, which has been consistent for months to years.  She is currently on blood thinners following the placement of a Watchman device and will continue until January. She mentions some swelling and is taking Lasix  as needed.  Rest of the pertinent 10 point ROS reviewed and neg.  Patient Active Problem List   Diagnosis Date Noted   Hypotension due to hypovolemia 11/21/2023   Dehydration 11/21/2023   Atrial fibrillation (HCC) 11/01/2023   Hypokalemia 01/11/2023   Acute cystitis without hematuria 01/10/2023   History of TIA (transient ischemic attack) 01/10/2023   Atrial fibrillation with RVR (HCC) 01/09/2023   TIA (transient ischemic attack) 10/17/2022   Primary hypertension 04/28/2022   Snoring 03/04/2021   Paroxysmal atrial fibrillation (HCC) 11/01/2020   Secondary hypercoagulable state (HCC) 11/01/2020   Increased urinary frequency 10/20/2020   Localized osteoporosis without current pathological fracture 10/20/2020   IDA (iron deficiency anemia) 04/07/2020   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  MEDICAL HISTORY: Past Medical History:  Diagnosis Date   Anemia with low platelet count (HCC)    Atrial fibrillation (HCC) 2022   Chronic kidney disease    Degenerative joint disease    Dysrhythmia    a fib   GERD (gastroesophageal reflux disease)    History  of kidney stones    Hyperlipidemia    Hypothyroidism    Inguinal hernia  02/19/2019   Labile hypertension    Migraines    neurontin  helps   Pneumonia    hx of walking pneumonia    Primary hypertension 04/28/2022    SURGICAL HISTORY: Past Surgical History:  Procedure Laterality Date   ATRIAL FIBRILLATION ABLATION N/A 11/01/2023   Procedure: ATRIAL FIBRILLATION ABLATION;  Surgeon: Cindie Ole DASEN, MD;  Location: [facility] INVASIVE CV LAB;  Service: Cardiovascular;  Laterality: N/A;   BILATERAL CATARACT SURGERY      BLERPHOROPLASTY \     CHOLECYSTECTOMY  1996   open   COLONOSCOPY  2009   recommended 10 year f/u   FOOT SURGERY Right    HERNIA REPAIR  1985   umb hernia rpr   HERNIA REPAIR  2005 & 2009   ventral hernia rpr   INGUINAL HERNIA REPAIR Left 04/30/2020   Procedure: OPEN LEFT INGUINAL HERNIA REPAIR WITH MESH;  Surgeon: Signe Mitzie LABOR, MD;  Location: [facility];  Service: General;  Laterality: Left;   INGUINAL HERNIA REPAIR Right 10/24/2021   Procedure: OPEN RIGHT INGUINAL HERNIA REPAIR WITH MESH ;  Surgeon: Signe Mitzie LABOR, MD;  Location: [facility];  Service: General;  Laterality: Right;   LEFT ATRIAL APPENDAGE OCCLUSION N/A 11/01/2023   Procedure: LEFT ATRIAL APPENDAGE OCCLUSION;  Surgeon: Cindie Ole DASEN, MD;  Location: [facility];  Service: Cardiovascular;  Laterality: N/A;   PILONIDAL CYST EXCISION  1972   SKIN CANCER REMOVAL      TONSILLECTOMY     TRANSESOPHAGEAL ECHOCARDIOGRAM (CATH LAB) N/A 11/01/2023   Procedure: TRANSESOPHAGEAL ECHOCARDIOGRAM;  Surgeon: Cindie Ole DASEN, MD;  Location: [facility] INVASIVE CV LAB;  Service: Cardiovascular;  Laterality: N/A;   UPPER GI ENDOSCOPY     VENTRAL HERNIA REPAIR      SOCIAL HISTORY: Social History   Socioeconomic History   Marital status: Married    Spouse name: Deward   Number of children: 2   Years of education: 18   Highest education level: Not on file  Occupational History   Not on file  Tobacco Use   Smoking status: Never   Smokeless tobacco: Never  Vaping Use   Vaping status:  Never Used  Substance and Sexual Activity   Alcohol  use: No   Drug use: No   Sexual activity: Yes  Other Topics Concern   Not on file  Social History Narrative   Live with husband    Retired    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  Food Insecurity: No Food Insecurity (11/01/2023)   Hunger Vital Sign    Worried About Running Out of Food in the Last Year: Never true    Ran Out of Food in the Last Year: Never true  Transportation Needs: No Transportation Needs (11/01/2023)   PRAPARE - Administrator, Civil Service (Medical): No    Lack of Transportation (Non-Medical): No  Physical Activity: Not on file  Stress: Not on file  Social Connections: Unknown (11/01/2023)   Social Connection and Isolation Panel    Frequency of Communication with Friends and Family: Never    Frequency of Social Gatherings with Friends and Family: Never    Attends Religious Services: Never    Database administrator or Organizations: No    Attends Banker Meetings: Never    Marital Status: Patient declined  Catering manager Violence: Not  At Risk (11/01/2023)   Humiliation, Afraid, Rape, and Kick questionnaire    Fear of Current or Ex-Partner: No    Emotionally Abused: No    Physically Abused: No    Sexually Abused: No    FAMILY HISTORY: Family History  Problem Relation Age of Onset   Ovarian cancer Mother    Heart disease Mother    Cancer Mother    Uterine cancer Mother    Hypertension Mother    Breast cancer Sister    Stroke Paternal Uncle    Congestive Heart Failure Father    Gout Maternal Aunt    Colon cancer Neg Hx    Esophageal cancer Neg Hx    Stomach cancer Neg Hx    Rectal cancer Neg Hx     Review of Systems  Constitutional:  Negative for appetite change, chills, fatigue, fever and unexpected weight change.  HENT:   Negative for hearing loss, lump/mass and trouble swallowing.   Eyes:  Negative for eye problems and icterus.   Respiratory:  Negative for chest tightness, cough and shortness of breath.   Cardiovascular:  Negative for chest pain, leg swelling and palpitations.  Gastrointestinal:  Negative for abdominal distention, abdominal pain, constipation, diarrhea, nausea and vomiting.  Endocrine: Negative for hot flashes.  Genitourinary:  Negative for difficulty urinating.   Musculoskeletal:  Negative for arthralgias.  Skin:  Negative for itching and rash.  Neurological:  Negative for dizziness, extremity weakness, headaches and numbness.  Hematological:  Negative for adenopathy. Does not bruise/bleed easily.  Psychiatric/Behavioral:  Negative for depression. The patient is not nervous/anxious.   All other systems reviewed and are negative.     PHYSICAL EXAMINATION   Onc Performance Status - 01/04/24 1300       KPS SCALE   KPS % SCORE Normal activity with effort, some s/s of disease          Vitals:   01/04/24 1307  BP: (!) 96/59  Pulse: 62  Resp: 15  Temp: 99.2 F (37.3 C)  SpO2: 98%   Alert, oriented no acute distress CTA bilaterally RRR No LE edema  LABORATORY DATA:  CBC    Component Value Date/Time   WBC 6.3 01/04/2024 1247   RBC 3.80 (L) 01/04/2024 1247   HGB 11.3 (L) 01/04/2024 1247   HGB 11.0 (L) 12/12/2023 1512   HCT 35.0 (L) 01/04/2024 1247   HCT 34.4 12/12/2023 1512   PLT 78 (L) 01/04/2024 1247   PLT 88 (LL) 12/12/2023 1512   MCV 92.1 01/04/2024 1247   MCV 95 12/12/2023 1512   MCH 29.7 01/04/2024 1247   MCHC 32.3 01/04/2024 1247   RDW 13.4 01/04/2024 1247   RDW 13.1 12/12/2023 1512   LYMPHSABS 1.0 01/04/2024 1247   LYMPHSABS 1.5 10/31/2023 1435   MONOABS 0.9 01/04/2024 1247   EOSABS 0.3 01/04/2024 1247   EOSABS 0.1 10/31/2023 1435   BASOSABS 0.0 01/04/2024 1247   BASOSABS 0.0 10/31/2023 1435    CMP     Component Value Date/Time   NA 140 12/12/2023 1512   K 4.4 12/12/2023 1512   CL 99 12/12/2023 1512   CO2 26 12/12/2023 1512   GLUCOSE 77 12/12/2023  1512   GLUCOSE 71 11/20/2023 1123   BUN 24 12/12/2023 1512   CREATININE 1.26 (H) 12/12/2023 1512   CREATININE 1.55 (H) 10/04/2023 1100   CREATININE 1.27 (H) 01/18/2023 1219   CALCIUM  10.2 12/12/2023 1512   PROT 6.9 10/04/2023 1100   ALBUMIN 4.1 10/04/2023 1100  AST 17 10/04/2023 1100   ALT 12 10/04/2023 1100   ALKPHOS 54 10/04/2023 1100   BILITOT 0.6 10/04/2023 1100   GFRNONAA 30 (L) 11/20/2023 1123   GFRNONAA 34 (L) 10/04/2023 1100   GFRNONAA 45 (L) 10/20/2020 1542   GFRAA 52 (L) 10/20/2020 1542    ASSESSMENT and THERAPY PLAN:   Assessment and Plan Assessment & Plan  Chronic immune thrombocytopenia Platelet count improved to 78,000 with Promacta . No significant side effects from medication. Shortness of breath likely cardiac-related. Hb stable for months. - Continue eltrombopag  indefinitely. - Send prescription to home-delivery pharmacy. - Schedule follow-up in three months. - Advise to call if issues arise before next appointment.  She will RTC in 3 months for follow up. Time spent: 20 min  *Total Encounter Time as defined by the Centers for Medicare and Medicaid Services includes, in addition to the face-to-face time of a patient visit (documented in the note above) non-face-to-face time: obtaining and reviewing outside history, ordering and reviewing medications, tests or procedures, care coordination (communications with other health care professionals or caregivers) and documentation in the medical record.

## 2024-01-07 ENCOUNTER — Other Ambulatory Visit (HOSPITAL_COMMUNITY): Payer: Self-pay

## 2024-01-07 ENCOUNTER — Other Ambulatory Visit: Payer: Self-pay | Admitting: Hematology and Oncology

## 2024-01-07 MED ORDER — ELTROMBOPAG OLAMINE 25 MG PO TABS
25.0000 mg | ORAL_TABLET | Freq: Every day | ORAL | 0 refills | Status: DC
  Filled 2024-01-07: qty 30, 30d supply, fill #0
  Filled 2024-02-05: qty 30, 30d supply, fill #1
  Filled 2024-02-28: qty 30, 30d supply, fill #2

## 2024-01-07 NOTE — Telephone Encounter (Signed)
 LMOVM for patient and husband (DPR) requesting call back to discuss CT results. Main office number provided.  Per CT over-read interpretation:  1. There are heterogeneous nodular opacities in the middle lobe and bilateral lower lobes, favoring multilobar pneumonia. Follow-up to clearing is recommended. 2. Moderate size hiatal hernia.  Discussed with Dr. Verlin (DOD). Per Dr. Verlin, pt should follow-up with PCP.

## 2024-01-07 NOTE — Telephone Encounter (Signed)
 Imaging calling to make sure Dr Cindie saw over read

## 2024-01-08 ENCOUNTER — Other Ambulatory Visit: Payer: Self-pay

## 2024-01-09 ENCOUNTER — Telehealth: Payer: Self-pay

## 2024-01-09 NOTE — Telephone Encounter (Signed)
 Attempted to call pt. LVM asking if she picked up medication and made aware that it is at [facility] phx. Advised pt to call us  back to let us  know she has the RX

## 2024-01-09 NOTE — Telephone Encounter (Signed)
 Spoke with patient to relay results and recommendation to follow-up with PCP. Pt denies any symptoms of pneumonia at this time. She agrees to call Dr. Roxane (PCP) office for recommendations. Will also route this note to Dr. Roanna as an RICK. Pt expressed appreciation of call and and denied questions or concerns.

## 2024-01-09 NOTE — Telephone Encounter (Signed)
-----   Message from [place] sent at 01/07/2024  4:13 PM EDT ----- Leita  Could u confirm with pt if she was able to pick up this prescription. If not I resent it today. ----- Message ----- From: Lucila Norleen LABOR, RPH-CPP Sent: 01/06/2024   3:06 PM EDT To: Amber Stalls, MD  Looks like this was sent to [facility] on 01/04/24 @ 1320. Please let me know if I can assist further. ----- Message ----- From: Stalls Amber, MD Sent: 01/04/2024   3:02 PM EDT To: Norleen LABOR Lucila, RPH-CPP  John  Not urgent, Pt couldn't remember where her promacta  was sent to last time and initially told me [facility], so I sent there. Could you verify the pharmacy to which she had her old prescription sent to and send a refill to the same one please.  Thanks

## 2024-01-16 ENCOUNTER — Telehealth: Payer: Self-pay | Admitting: Internal Medicine

## 2024-01-16 ENCOUNTER — Telehealth: Payer: Self-pay | Admitting: Cardiology

## 2024-01-16 NOTE — Telephone Encounter (Signed)
 Received call from Dr. Roanna- [facility] and [facility]:  Ms. Wolbert with persistent atrial fibrillation, presented with orthostatic hypotension.  Former [facility] Patient, now CS.  She has a history of paroxysmal persistent atrial fibrillation and is currently on dual AV nodal therapy, including metoprolol  37.5 mg BID and diltiazem  180 mg extended release. Despite this regimen, she is experiencing orthostatic hypotension, which is a recent development.  She is status post Watchman procedure and has been otherwise doing well.  Atrial fibrillation with SVR and Orthostatic hypotension  She is experiencing paroxysmal atrial fibrillation and is currently on dual AV nodal therapy with metoprolol  and diltiazem . She is status post Watchman procedure. The current issue is orthostatic hypotension, likely exacerbated by the dual AV nodal therapy. Adjusting her medication regimen aims to address this issue while maintaining adequate heart rate control. - Reduce metoprolol  to 12.5 mg BID. - Continue diltiazem  180 mg extended release. - Monitor for improvement in orthostatic symptoms and heart rate control. - Consider further medication adjustments if orthostatic symptoms persist or if heart rate control is inadequate (may be able to wean to CCB monotherapy) - has f/u with Dr. Kate 03/2024 unless he would like to move this up.  Stanly Leavens, MD FASE [facility] Cardiologist [facility]  [address] [place], [place] [address] [phone]  2:18 PM

## 2024-01-16 NOTE — Telephone Encounter (Signed)
  Dr Roanna would like to speak to DOD.

## 2024-01-20 NOTE — Progress Notes (Unsigned)
 Cardiology Office Note:    Date:  01/20/2024   ID:  Jillian Rodgers, DOB 07/06/1945, MRN [id number]  PCP:  [name], MD  Cardiologist:  Lonni LITTIE Nanas, MD  Electrophysiologist:  OLE ONEIDA HOLTS, MD   Referring MD: [name], MD   No chief complaint on file.   History of Present Illness:    Jillian Rodgers is a 78 y.o. female with a hx of paroxysmal atrial fibrillation, chronic thrombocytopenia, TIA, CKD stage III, hypertension, hypothyroidism, hyperlipidemia who presents for follow-up.  She was diagnosed with A-fib 10/2020, underwent successful DCCV at that time.  She was taken off anticoagulation due to chronic soft thrombocytopenia.  Admitted 10/2022 with TIA.  She was admitted 12/2022 with A-fib with RVR.  During admission, her platelets were stable in 60s and in discussion with hematology she tolerated heparin  drip she was transitioned to Eliquis .  She spontaneously converted to normal sinus rhythm during admission and was discharged on Eliquis  and metoprolol .  At follow-up in 01/2023, her platelet count had declined to 30s and Eliquis  was held.  Underwent steroid treatment for thrombocytopenia on 02/2023 and IVIG 03/2023 with improvement in platelet count and restarted on Eliquis .  She was admitted 03/2023 with recurrent A-fib with RVR, was continued on Eliquis  5 mg twice daily, and she spontaneously converted to sinus rhythm during admission.  She was seen by Dr. HOLTS on 04/17/2023, planning A-fib ablation/Watchman procedure.  She was admitted with recurrent A-fib 04/18/2023.  She underwent A-fib ablation and watchman placement on 11/01/2023.  Since last clinic visit, she reports she is doing okay.  States that lightheadedness has resolved. Has taken Lasix  once since last clinic visit for lower extremity edema. Denies any chest pain, dyspnea, lightheadedness, syncope, or palpitations.   Wt Readings from Last 3 Encounters:  01/04/24 162 lb 3.2 oz (73.6 kg)   12/13/23 157 lb 6.4 oz (71.4 kg)  12/12/23 160 lb (72.6 kg)     Past Medical History:  Diagnosis Date   Anemia with low platelet count    Atrial fibrillation (HCC) 2022   Chronic kidney disease    Degenerative joint disease    Dysrhythmia    a fib   GERD (gastroesophageal reflux disease)    History of kidney stones    Hyperlipidemia    Hypothyroidism    Inguinal hernia 02/19/2019   Labile hypertension    Migraines    neurontin  helps   Pneumonia    hx of walking pneumonia    Primary hypertension 04/28/2022    Past Surgical History:  Procedure Laterality Date   ATRIAL FIBRILLATION ABLATION N/A 11/01/2023   Procedure: ATRIAL FIBRILLATION ABLATION;  Surgeon: HOLTS OLE ONEIDA, MD;  Location: [facility];  Service: Cardiovascular;  Laterality: N/A;   BILATERAL CATARACT SURGERY      BLERPHOROPLASTY \     CHOLECYSTECTOMY  1996   open   COLONOSCOPY  2009   recommended 10 year f/u   FOOT SURGERY Right    HERNIA REPAIR  1985   umb hernia rpr   HERNIA REPAIR  2005 & 2009   ventral hernia rpr   INGUINAL HERNIA REPAIR Left 04/30/2020   Procedure: OPEN LEFT INGUINAL HERNIA REPAIR WITH MESH;  Surgeon: Signe Mitzie DELENA, MD;  Location: [facility];  Service: General;  Laterality: Left;   INGUINAL HERNIA REPAIR Right 10/24/2021   Procedure: OPEN RIGHT INGUINAL HERNIA REPAIR WITH MESH ;  Surgeon: Signe Mitzie DELENA, MD;  Location: [facility];  Service: General;  Laterality: Right;   LEFT ATRIAL APPENDAGE OCCLUSION N/A 11/01/2023   Procedure: LEFT ATRIAL APPENDAGE OCCLUSION;  Surgeon: Cindie Ole DASEN, MD;  Location: [facility];  Service: Cardiovascular;  Laterality: N/A;   PILONIDAL CYST EXCISION  1972   SKIN CANCER REMOVAL      TONSILLECTOMY     TRANSESOPHAGEAL ECHOCARDIOGRAM (CATH LAB) N/A 11/01/2023   Procedure: TRANSESOPHAGEAL ECHOCARDIOGRAM;  Surgeon: Cindie Ole DASEN, MD;  Location: [facility];  Service: Cardiovascular;  Laterality: N/A;   UPPER GI ENDOSCOPY      VENTRAL HERNIA REPAIR      Current Medications: No outpatient medications have been marked as taking for the 01/25/24 encounter (Appointment) with Kate Lonni CROME, MD.     Allergies:   Patient has no known allergies.   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  Food Insecurity: No Food Insecurity (11/01/2023)   Hunger Vital Sign    Worried About Running Out of Food in the Last Year: Never true    Ran Out of Food in the Last Year: Never true  Transportation Needs: No Transportation Needs (11/01/2023)   PRAPARE - Administrator, Civil Service (Medical): No    Lack of Transportation (Non-Medical): No  Physical Activity: Not on file  Stress: Not on file  Social Connections: Unknown (11/01/2023)   Social Connection and Isolation Panel    Frequency of Communication with Friends and Family: Never    Frequency of Social Gatherings with Friends and Family: Never    Attends Religious Services: Never    Database administrator or Organizations: No    Attends Engineer, structural: Never    Marital Status: Patient declined     Family History: The patient's family history includes Breast cancer in her sister; Cancer in her mother; Congestive Heart Failure in her father; Gout in her maternal aunt; Heart disease in her mother; Hypertension in her mother; Ovarian cancer in her mother; Stroke in her paternal uncle; Uterine cancer in her  mother. There is no history of Colon cancer, Esophageal cancer, Stomach cancer, or Rectal cancer.  ROS:   Please see the history of present illness.     All other systems reviewed and are negative.  EKGs/Labs/Other Studies Reviewed:    The following studies were reviewed today:   EKG:   11/20/2023: Sinus bradycardia, rate 59, nonspecific T wave flattening 04/23/2023: Sinus bradycardia, rate 55, no ST abnormalities 07/25/23: Normal sinus rhythm, rate 69, no ST abnormalities  Recent Labs: 03/23/2023: TSH 3.248 03/25/2023: Magnesium  2.2 04/18/2023: B Natriuretic Peptide 721.8 01/04/2024: ALT 15; BUN 22; Creatinine 1.37; Hemoglobin 11.3; Platelets 78; Potassium 3.8; Sodium 140  Recent Lipid Panel    Component Value Date/Time   CHOL 143 01/03/2023 1533   TRIG 94 01/03/2023 1533  HDL 72 01/03/2023 1533   CHOLHDL 2.0 01/03/2023 1533   VLDL 34 (H) 09/26/2016 1147   LDLCALC 53 01/03/2023 1533    Physical Exam:    VS:  There were no vitals taken for this visit.    Wt Readings from Last 3 Encounters:  01/04/24 162 lb 3.2 oz (73.6 kg)  12/13/23 157 lb 6.4 oz (71.4 kg)  12/12/23 160 lb (72.6 kg)     GEN:  Well nourished, well developed in no acute distress HEENT: Normal NECK: No JVD; No carotid bruits LYMPHATICS: No lymphadenopathy CARDIAC: RRR, no murmurs, rubs, gallops RESPIRATORY:  Clear to auscultation without rales, wheezing or rhonchi  ABDOMEN: Soft, non-tender, non-distended MUSCULOSKELETAL:  trace edema SKIN: Warm and dry NEUROLOGIC:  Alert and oriented x 3 PSYCHIATRIC:  Normal affect   ASSESSMENT:    No diagnosis found.     PLAN:    Lightheadedness/hypotension/AKI: Sent to ED from clinic 11/20/2023, SBP down to 80s.  Suspect due to dehydration from taking Lasix  daily since her ablation.  Labs notable for AKI with creatinine up to 1.71.  Lasix  changed to as needed.  BP significantly improved.  Reports symptoms have resolved. - Check BMET - Has been having orthostasis,  metoprolol  dose was decreased to 12.5 mg twice daily.  Will Goeden discontinue metoprolol ***  Paroxysmal atrial fibrillation: Complicated situation, has had recurrent admissions for A-fib with RVR but has been unable to tolerate anticoagulation despite high CHA2DS2-VASc score (6, hypertension, age x 2, TIA, female) due to chronic thrombocytopenia.  Echocardiogram 12/2022 showed EF 60 to 65%, normal RV function, severe left atrial enlargement, no significant valvular disease.  She underwent A-fib ablation and watchman placement on 11/01/2023. -Continue Eliquis  5 mg twice daily for now, will need to complete 90 days of anticoagulation post Watchman -Continue Cardizem  180 mg daily.  Discontinue metoprolol  due to orthostasis as above***  Chronic thrombocytopenia: Follows with hematology, likely ITP.  Treated with steroids 02/2023 and IVIG 03/2023, with improvement in counts.  Platelets were again down to 39 on 06/18/2023.  Underwent additional IVIG treatment and most recent platelets 78 on 01/04/2024  Lower extremity edema: On as needed Lasix  20 mg  Hypertension: On metoprolol  and diltiazem   TIA: Continue atorvastatin , Eliquis   OSA: Has known OSA but has not been on CPAP.  Itamar ordered last year but states she was not called to activate device.  Will discuss with sleep coordinator***  RTC in 4 months***   Medication Adjustments/Labs and Tests Ordered: Current medicines are reviewed at length with the patient today.  Concerns regarding medicines are outlined above.  No orders of the defined types were placed in this encounter.  No orders of the defined types were placed in this encounter.   There are no Patient Instructions on file for this visit.   Signed, Lonni LITTIE Nanas, MD  01/20/2024 5:55 PM     [facility]

## 2024-01-25 ENCOUNTER — Encounter: Payer: Self-pay | Admitting: Cardiology

## 2024-01-25 ENCOUNTER — Ambulatory Visit: Attending: Cardiology | Admitting: Cardiology

## 2024-01-25 VITALS — BP 143/88 | HR 60 | Ht 64.0 in | Wt 158.6 lb

## 2024-01-25 DIAGNOSIS — G4733 Obstructive sleep apnea (adult) (pediatric): Secondary | ICD-10-CM

## 2024-01-25 DIAGNOSIS — I951 Orthostatic hypotension: Secondary | ICD-10-CM | POA: Diagnosis not present

## 2024-01-25 DIAGNOSIS — D696 Thrombocytopenia, unspecified: Secondary | ICD-10-CM

## 2024-01-25 DIAGNOSIS — R42 Dizziness and giddiness: Secondary | ICD-10-CM | POA: Diagnosis not present

## 2024-01-25 DIAGNOSIS — I480 Paroxysmal atrial fibrillation: Secondary | ICD-10-CM

## 2024-01-25 NOTE — Patient Instructions (Addendum)
 Medication Instructions:  Your physician recommends that you continue on your current medications as directed. Please refer to the Current Medication list given to you today.   Decrease metoprolol  to 12.5 mg twice a day for one week and then stop *If you need a refill on your cardiac medications before your next appointment, please call your pharmacy*  Lab Work: none If you have labs (blood work) drawn today and your tests are completely normal, you will receive your results only by: MyChart Message (if you have MyChart) OR A paper copy in the mail If you have any lab test that is abnormal or we need to change your treatment, we will call you to review the results.  Testing/Procedures: Split night sleep study  Your physician has recommended that you have a sleep study. This test records several body functions during sleep, including: brain activity, eye movement, oxygen  and carbon dioxide blood levels, heart rate and rhythm, breathing rate and rhythm, the flow of air through your mouth and nose, snoring, body muscle movements, and chest and belly movement.   Follow-Up: At [facility], you and your health needs are our priority.  As part of our continuing mission to provide you with exceptional heart care, our providers are all part of one team.  This team includes your primary Cardiologist (physician) and Advanced Practice Providers or APPs (Physician Assistants and Nurse Practitioners) who all work together to provide you with the care you need, when you need it.  Your next appointment Dec 16 @ 8:00 am  Provider:   Dr. Kate  We recommend signing up for the patient portal called MyChart.  Sign up information is provided on this After Visit Summary.  MyChart is used to connect with patients for Virtual Visits (Telemedicine).  Patients are able to view lab/test results, encounter notes, upcoming appointments, etc.  Non-urgent messages can be sent to your provider as well.    To learn more about what you can do with MyChart, go to [email or web address].   Other Instructions Please purchase some compression stocking and put on in am and take off at bedtime

## 2024-01-29 ENCOUNTER — Ambulatory Visit: Admitting: Podiatry

## 2024-01-29 DIAGNOSIS — B351 Tinea unguium: Secondary | ICD-10-CM

## 2024-01-29 DIAGNOSIS — M79672 Pain in left foot: Secondary | ICD-10-CM

## 2024-01-29 DIAGNOSIS — M79671 Pain in right foot: Secondary | ICD-10-CM

## 2024-01-29 NOTE — Progress Notes (Signed)
 Patient presents for evaluation and treatment of tenderness and some redness around nails feet.  Tenderness around toes with walking and wearing shoes.  Physical exam:  General appearance: Alert, pleasant, and in no acute distress.  Vascular: Pedal pulses: DP 2/4 B/L, PT 2/4 B/L. Mild edema lower legs bilaterally  Neu  Dermatologic:  Nails thickened, disfigured, discolored 1-5 BL with subungual debris.  Redness and hypertrophic nail folds along nail folds bilaterally but no signs of drainage or infection.  Musculoskeletal:     Diagnosis: 1. Painful onychomycotic nails 1 through 5 bilaterally. 2. Pain toes 1 through 5 bilaterally.  Plan: -Debrided onychomycotic nails 1 through 5 bilaterally.  Sharply debrided nails with nail clipper and reduced with a power bur.  Return 3 months [facility]

## 2024-02-05 ENCOUNTER — Other Ambulatory Visit: Payer: Self-pay

## 2024-02-05 NOTE — Progress Notes (Signed)
 Specialty Pharmacy Refill Coordination Note  Jillian Rodgers is a 78 y.o. female contacted today regarding refills of specialty medication(s) Eltrombopag  Olamine (PROMACTA )   Patient requested Delivery   Delivery date: 02/07/24   Verified address: [address]  [place] [address]   Medication will be filled on 02/06/24.

## 2024-02-20 NOTE — Progress Notes (Unsigned)
 Patient: Jillian Rodgers Date of Birth: [birth year]  Reason for Visit: Follow up History from: Patient Primary Neurologist: Rosemarie  ASSESSMENT AND PLAN 78 y.o. year old female with transient episode of speech difficulty and confusion July 2024. Possible left hemispheric TIA versus atypical migraine.  History of paroxysmal A-fib and chronic thrombocytopenia, is not a good candidate for long-term anticoagulation.  Watchman device was placed in July 2025 and had AFIB ablation. On Depakote  for migraine and seizure prevention. EEG was normal. She has generalized seizure event during pregnancy, none since.   - Doing well, continue Depakote  ER 500 mg, taking AM, try taking PM to see if less daytime fatigue. May consider CGRP if headaches increase. Likely no higher dose of Depakote  due to possible side effects of thrombocytopenia - Strict management of vascular risk factors with a goal BP less than 130/90, A1c less than 7.0, LDL less than 70 for secondary stroke prevention - Continue follow-up with cardiology and primary care - Follow-up in 1 year for medication refill of Depakote  and to check on headaches  HISTORY  03/26/23 Dr. Rosemarie HPI: Ms. Yaun is a pleasant 78 year old Caucasian lady seen today for initial office follow-up visit following hospital consultation for TIA July 2024.  She is accompanied by her husband today.  History is obtained from them and review of electronic medical records.  I personally reviewed pertinent available imaging films in PACS.  She has past medical history of chronic thrombocytopenia, gastroesophageal reflux disease, hypertension, hyperlipidemia, paroxysmal A-fib not on anticoagulation and hypothyroidism.  She presented on 10/18/2022 with a 10 to 15-minute episode of speech difficulties and lightheadedness.  She was at home and had just walked to the kitchen when she knew what she wanted to say but had trouble getting the words out and her speech did not make sense.  She  also felt slightly confused but this resolved in 10 to 15 minutes.  She states she had this somewhat similar episode a few months ago when she had trouble writing when she was checking out from her doctor's office visit.  The staff is trying to set her up for a follow-up appointment so she looked at her calendar but could not make sense of what she was doing.  This lasted only few minutes.  She did have a mild headache during her more recent episode but not for the initial 1.  She denies prior history of migraines.  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  She has not been on seizure medications and not had any follow-up seizures since then.  She does have a history of paroxysmal A-fib she reports that at some time she was on Eliquis  however she was taken off it after cardiology was discussed with with her hematologist because of her low platelet count.  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 Rosemarie Dec 2024 recommended continue Eliquis  but felt she was not a long-term candidate for anticoagulation given chronic thrombocytopenia.  Referral to Dr. Cindie for consideration of Watchman device.  Continue Depakote  ER 500 mg daily for migraine and seizure prevention. Watchman was placed 11/01/23 and AFIB ablation. Is off Eliquis , is on Plavix  until Jan, then plan to be off. Platelets are stable,  78 in Sept 2025. Remains on Depakote  ER 500 mg daily. No seizures. 3-4 headaches may be moderate intensity, takes Tylenol  with good benefit.   REVIEW OF SYSTEMS: Out of a complete 14 system review of symptoms, the patient complains only of the following symptoms, and all other reviewed systems are negative.  See HPI  ALLERGIES: No  Known Allergies  HOME MEDICATIONS: Outpatient Medications Prior to Visit  Medication Sig Dispense Refill   Ascorbic Acid (VITAMIN C) 500 MG CAPS Take 500 mg by mouth daily.      atorvastatin  (LIPITOR ) 80 MG tablet Take 1 tablet (80 mg total) by mouth daily. 30 tablet 3   BOTOX 100 units SOLR injection Inject 100 Units into the muscle See admin instructions. Inject 100 units intramuscularly every 6-8 weeks     calcium  carbonate (SUPER CALCIUM ) 1500 (600 Ca) MG TABS tablet Take 600 mg of elemental calcium  by mouth at bedtime.     cetirizine (ZYRTEC) 10 MG tablet Take 10 mg by mouth daily.     Cholecalciferol (VITAMIN D3) 125 MCG (5000 UT) TABS Take 5,000 Units by mouth daily.     clopidogrel  (PLAVIX ) 75 MG tablet Take 1 tablet (75 mg total) by mouth daily. Start taking on 12/17/23 90 tablet 1   denosumab (PROLIA) 60 MG/ML SOSY injection Inject 60 mg into the skin every 6 (six) months.     diltiazem  (CARDIZEM  CD) 180 MG 24 hr capsule Take 1 capsule (180 mg total) by mouth daily. 90 capsule 3   eltrombopag  (PROMACTA ) 25 MG tablet Take 1 tablet (25 mg total) by mouth daily. Take on an empty stomach, 1 hour before a meal or 2 hours after. 90 tablet 0   gabapentin  (NEURONTIN ) 300 MG capsule Take 300 mg (1 tablet) in the morning and 600 mg (2 tablets) at bedtime 90 capsule 0   levothyroxine  (SYNTHROID ) 50 MCG tablet Take  1 tablet  Daily  on an empty stomach with only water for 30 minutes & no Antacid meds, Calcium  or Magnesium  for 4 hours & avoid Biotin                                                                                                                    /                                                                   TAKE                                         BY  MOUTH (Patient taking differently: Take 50 mcg by mouth See admin instructions. Take 50 mcg by mouth in the morning on an empty stomach, with only water. Take no antacids, calcium , or  magnesium  for 4 hours and avoid biotin.) 90 tablet 3   Magnesium  Oxide 400 MG CAPS Take 1 capsule (400 mg total) by mouth at bedtime.     pantoprazole  (PROTONIX ) 40 MG tablet Take  1 tablet  2 x /day  for Acid Indigestion & Heartburn     SYSTANE BALANCE 0.6 % SOLN Place 1 drop into both eyes at bedtime as needed (dry eyes).     SYSTANE ULTRA 0.4-0.3 % SOLN Place 1 drop into both eyes daily as needed (dry eyes).     TYLENOL  500 MG tablet Take 500-1,000 mg by mouth every 6 (six) hours as needed for mild pain or headache.     Zinc 30 MG TABS Take 30 mg by mouth daily.     divalproex  (DEPAKOTE  ER) 500 MG 24 hr tablet Take 1 tablet (500 mg total) by mouth daily. 30 tablet 3   amoxicillin  (AMOXIL ) 500 MG tablet Take 4 tablets (2,000 mg total) by mouth as needed (1 hr prior to dental procedures). (Patient not taking: Reported on 02/21/2024) 4 tablet 1   cephALEXin  (KEFLEX ) 500 MG capsule Take 1 capsule (500 mg total) by mouth 2 (two) times daily. (Patient not taking: Reported on 02/21/2024) 14 capsule 0   colchicine  0.6 MG tablet Take 1 tablet (0.6 mg total) by mouth 2 (two) times daily for 5 days. Does not need refills, post procedure medication. (Patient not taking: Reported on 02/21/2024) 10 tablet 0   sodium chloride  (OCEAN) 0.65 % SOLN nasal spray Place 1 spray into both nostrils as needed for congestion. (Patient not taking: Reported on 02/21/2024) 15 mL 0   No facility-administered medications prior to visit.    PAST MEDICAL HISTORY: Past Medical History:  Diagnosis Date   Anemia with low platelet count    Atrial fibrillation (HCC) 2022   Chronic kidney disease    Degenerative joint disease    Dysrhythmia    a fib   GERD (gastroesophageal reflux disease)    History of kidney stones    Hyperlipidemia    Hypothyroidism    Inguinal hernia 02/19/2019   Labile hypertension    Migraines    neurontin  helps   Pneumonia    hx of walking pneumonia    Primary hypertension 04/28/2022    PAST  SURGICAL HISTORY: Past Surgical History:  Procedure Laterality Date   ATRIAL FIBRILLATION ABLATION N/A 11/01/2023   Procedure: ATRIAL FIBRILLATION ABLATION;  Surgeon: Cindie Ole DASEN, MD;  Location: [facility];  Service: Cardiovascular;  Laterality: N/A;   BILATERAL CATARACT SURGERY      BLERPHOROPLASTY \     CHOLECYSTECTOMY  1996   open   COLONOSCOPY  2009   recommended 10 year f/u   FOOT SURGERY Right    HERNIA REPAIR  1985   umb hernia rpr   HERNIA REPAIR  2005 & 2009   ventral hernia rpr   INGUINAL HERNIA REPAIR Left 04/30/2020   Procedure: OPEN LEFT INGUINAL HERNIA REPAIR WITH MESH;  Surgeon: Signe Mitzie LABOR, MD;  Location: [facility];  Service: General;  Laterality: Left;   INGUINAL HERNIA REPAIR Right 10/24/2021   Procedure: OPEN RIGHT INGUINAL HERNIA REPAIR WITH MESH ;  Surgeon: Signe Mitzie LABOR, MD;  Location: [facility];  Service: General;  Laterality: Right;   LEFT  ATRIAL APPENDAGE OCCLUSION N/A 11/01/2023   Procedure: LEFT ATRIAL APPENDAGE OCCLUSION;  Surgeon: Cindie Ole DASEN, MD;  Location: [facility];  Service: Cardiovascular;  Laterality: N/A;   PILONIDAL CYST EXCISION  1972   SKIN CANCER REMOVAL      TONSILLECTOMY     TRANSESOPHAGEAL ECHOCARDIOGRAM (CATH LAB) N/A 11/01/2023   Procedure: TRANSESOPHAGEAL ECHOCARDIOGRAM;  Surgeon: Cindie Ole DASEN, MD;  Location: [facility] CV [facility];  Service: Cardiovascular;  Laterality: N/A;   UPPER GI ENDOSCOPY     VENTRAL HERNIA REPAIR      FAMILY HISTORY: Family History  Problem Relation Age of Onset   Ovarian cancer Mother    Heart disease Mother    Cancer Mother    Uterine cancer Mother    Hypertension Mother    Breast cancer Sister    Stroke Paternal Uncle    Congestive Heart Failure Father    Gout Maternal Aunt    Colon cancer Neg Hx    Esophageal cancer Neg Hx    Stomach cancer Neg Hx    Rectal cancer Neg Hx     SOCIAL HISTORY: Social History   Socioeconomic History   Marital status: Married     Spouse name: Deward   Number of children: 2   Years of education: 18   Highest education level: Not on file  Occupational History   Not on file  Tobacco Use   Smoking status: Never   Smokeless tobacco: Never  Vaping Use   Vaping status: Never Used  Substance and Sexual Activity   Alcohol  use: No   Drug use: No   Sexual activity: Yes  Other Topics Concern   Not on file  Social History Narrative   Live with husband    Retired    Chief Executive Officer Drivers of Corporate Investment Banker Strain: Not on file  Food Insecurity: No Food Insecurity (11/01/2023)   Hunger Vital Sign    Worried About Running Out of Food in the Last Year: Never true    Ran Out of Food in the Last Year: Never true  Transportation Needs: No Transportation Needs (11/01/2023)   PRAPARE - Administrator, Civil Service (Medical): No    Lack of Transportation (Non-Medical): No  Physical Activity: Not on file  Stress: Not on file  Social Connections: Unknown (11/01/2023)   Social Connection and Isolation Panel    Frequency of Communication with Friends and Family: Never    Frequency of Social Gatherings with Friends and Family: Never    Attends Religious Services: Never    Database Administrator or Organizations: No    Attends Banker Meetings: Never    Marital Status: Patient declined  Catering Manager Violence: Not At Risk (11/01/2023)   Humiliation, Afraid, Rape, and Kick questionnaire    Fear of Current or Ex-Partner: No    Emotionally Abused: No    Physically Abused: No    Sexually Abused: No    PHYSICAL EXAM  Vitals:   02/21/24 1058  BP: (!) 152/102  Pulse: (!) 58  SpO2: (!) 89%  Weight: 158 lb 8 oz (71.9 kg)  Height: 5' 4 (1.626 m)   Body mass index is 27.21 kg/m.  Generalized: Well developed, in no acute distress  Neurological examination  Mentation: Alert oriented to time, place, history taking. Follows all commands speech and language fluent Cranial nerve II-XII:  Pupils were equal round reactive to light. Extraocular movements were full, visual field were full  on confrontational test. Facial sensation and strength were normal.  Head turning and shoulder shrug  were normal and symmetric. Motor: The motor testing reveals 5 over 5 strength of all 4 extremities. Good symmetric motor tone is noted throughout.  Sensory: Sensory testing is intact to soft touch on all 4 extremities. No evidence of extinction is noted.  Coordination: Cerebellar testing reveals good finger-nose-finger and heel-to-shin bilaterally.  Gait and station: Gait is normal.  Reflexes: Deep tendon reflexes are symmetric and normal bilaterally.   DIAGNOSTIC DATA (LABS, IMAGING, TESTING) - I reviewed patient records, labs, notes, testing and imaging myself where available.  Lab Results  Component Value Date   WBC 6.3 01/04/2024   HGB 11.3 (L) 01/04/2024   HCT 35.0 (L) 01/04/2024   MCV 92.1 01/04/2024   PLT 78 (L) 01/04/2024      Component Value Date/Time   NA 140 01/04/2024 1247   NA 140 12/12/2023 1512   K 3.8 01/04/2024 1247   CL 103 01/04/2024 1247   CO2 30 01/04/2024 1247   GLUCOSE 122 (H) 01/04/2024 1247   BUN 22 01/04/2024 1247   BUN 24 12/12/2023 1512   CREATININE 1.37 (H) 01/04/2024 1247   CREATININE 1.27 (H) 01/18/2023 1219   CALCIUM  9.9 01/04/2024 1247   PROT 6.8 01/04/2024 1247   ALBUMIN 3.8 01/04/2024 1247   AST 21 01/04/2024 1247   ALT 15 01/04/2024 1247   ALKPHOS 56 01/04/2024 1247   BILITOT 0.6 01/04/2024 1247   GFRNONAA 39 (L) 01/04/2024 1247   GFRNONAA 45 (L) 10/20/2020 1542   GFRAA 52 (L) 10/20/2020 1542   Lab Results  Component Value Date   CHOL 143 01/03/2023   HDL 72 01/03/2023   LDLCALC 53 01/03/2023   TRIG 94 01/03/2023   CHOLHDL 2.0 01/03/2023   Lab Results  Component Value Date   HGBA1C 5.7 (H) 01/03/2023   Lab Results  Component Value Date   VITAMINB12 429 02/12/2023   Lab Results  Component Value Date   TSH 3.248 03/23/2023     Lauraine Born, AGNP-C, DNP 02/21/2024, 12:44 PM [facility] [address] [place], [place] [address] [phone]

## 2024-02-21 ENCOUNTER — Encounter: Payer: Self-pay | Admitting: Neurology

## 2024-02-21 ENCOUNTER — Ambulatory Visit: Admitting: Neurology

## 2024-02-21 VITALS — BP 152/102 | HR 58 | Ht 64.0 in | Wt 158.5 lb

## 2024-02-21 DIAGNOSIS — G459 Transient cerebral ischemic attack, unspecified: Secondary | ICD-10-CM

## 2024-02-21 DIAGNOSIS — Z87442 Personal history of urinary calculi: Secondary | ICD-10-CM | POA: Diagnosis not present

## 2024-02-21 DIAGNOSIS — I4891 Unspecified atrial fibrillation: Secondary | ICD-10-CM

## 2024-02-21 DIAGNOSIS — N281 Cyst of kidney, acquired: Secondary | ICD-10-CM | POA: Insufficient documentation

## 2024-02-21 MED ORDER — DIVALPROEX SODIUM ER 500 MG PO TB24: 500.0000 mg | ORAL_TABLET | Freq: Every day | ORAL | 3 refills | Status: AC

## 2024-02-21 NOTE — Patient Instructions (Addendum)
 Try taking Depakote  at night, continue current dosing, call for worsening headaches. Strict management of vascular risk factors with a goal BP less than 130/90, A1c less than 7.0, LDL less than 70 for secondary stroke prevention

## 2024-02-28 ENCOUNTER — Other Ambulatory Visit: Payer: Self-pay

## 2024-03-03 ENCOUNTER — Other Ambulatory Visit (HOSPITAL_COMMUNITY): Payer: Self-pay

## 2024-03-05 ENCOUNTER — Other Ambulatory Visit: Payer: Self-pay | Admitting: Pharmacy Technician

## 2024-03-05 ENCOUNTER — Other Ambulatory Visit: Payer: Self-pay

## 2024-03-05 NOTE — Progress Notes (Signed)
 Specialty Pharmacy Refill Coordination Note  Jillian Rodgers is a 78 y.o. female contacted today regarding refills of specialty medication(s) Eltrombopag  Olamine (PROMACTA )   Patient requested Delivery   Delivery date: 03/10/24   Verified address: [address]  [place]   Medication will be filled on: 03/07/24

## 2024-03-06 ENCOUNTER — Other Ambulatory Visit: Payer: Self-pay

## 2024-03-11 ENCOUNTER — Other Ambulatory Visit: Payer: Self-pay

## 2024-03-11 NOTE — Progress Notes (Signed)
 Specialty Pharmacy Ongoing Clinical Assessment Note  Jillian Rodgers is a 78 y.o. female who is being followed by the specialty pharmacy service for RxSp Oncology   Patient's specialty medication(s) reviewed today: Eltrombopag  Olamine (PROMACTA )   Missed doses in the last 4 weeks: 0   Patient/Caregiver did not have any additional questions or concerns.   Therapeutic benefit summary: Patient is achieving benefit   Adverse events/side effects summary: No adverse events/side effects   Patient's therapy is appropriate to: Continue    Goals Addressed             This Visit's Progress    Maintain optimal adherence to therapy   On track    Patient is on track. Patient will maintain adherence         Follow up: 3 months  Silvano LOISE Dolly Specialty Pharmacist

## 2024-03-19 ENCOUNTER — Telehealth: Payer: Self-pay | Admitting: Hematology and Oncology

## 2024-03-19 NOTE — Telephone Encounter (Signed)
 I spoke with patient and she is aware of appointment being rescheduled to 03/31/2024 from 04/04/2024.

## 2024-03-28 ENCOUNTER — Other Ambulatory Visit: Payer: Self-pay

## 2024-03-28 ENCOUNTER — Other Ambulatory Visit: Payer: Self-pay | Admitting: Hematology and Oncology

## 2024-03-28 ENCOUNTER — Other Ambulatory Visit (HOSPITAL_COMMUNITY): Payer: Self-pay

## 2024-03-28 ENCOUNTER — Telehealth: Payer: Self-pay

## 2024-03-28 MED ORDER — ELTROMBOPAG OLAMINE 25 MG PO TABS
25.0000 mg | ORAL_TABLET | Freq: Every day | ORAL | 0 refills | Status: DC
  Filled 2024-03-28 (×2): qty 30, 30d supply, fill #0

## 2024-03-28 NOTE — Telephone Encounter (Signed)
 Oral Oncology Patient Advocate Encounter  Prior Authorization for Promacta  25mg  has been approved.    PA# [id number] Effective dates: 03/28/24 through 09/24/24  I let [facility] know.   Lucie Lamer, CPhT [place]  [facility] Pharm[facility] Services Oncology Pharmacy Patient Advocate Specialist [address] [place] Phone: [phone]  Fax: [phone] [email or web address]

## 2024-03-28 NOTE — Telephone Encounter (Addendum)
 Oral Oncology Patient Advocate Encounter   Received notification that prior authorization for eltrombopag  (PROMACTA ) 25 MG tablet  is required.   PA submitted on 03/28/24 Key [id number] Status is pending     Lucie Lamer, CPhT [place]  [place] [facility] Oncology Pharmacy Patient Advocate Specialist [facility] [place] Phone: [phone]  Fax: [phone] [email or web address]

## 2024-03-31 ENCOUNTER — Inpatient Hospital Stay: Admitting: Hematology and Oncology

## 2024-03-31 ENCOUNTER — Other Ambulatory Visit: Payer: Self-pay

## 2024-03-31 ENCOUNTER — Inpatient Hospital Stay: Attending: Hematology and Oncology

## 2024-03-31 VITALS — BP 135/78 | HR 85 | Temp 97.1°F | Resp 16 | Wt 156.7 lb

## 2024-03-31 DIAGNOSIS — D693 Immune thrombocytopenic purpura: Secondary | ICD-10-CM

## 2024-03-31 DIAGNOSIS — M542 Cervicalgia: Secondary | ICD-10-CM | POA: Diagnosis not present

## 2024-03-31 DIAGNOSIS — D696 Thrombocytopenia, unspecified: Secondary | ICD-10-CM

## 2024-03-31 DIAGNOSIS — Z9181 History of falling: Secondary | ICD-10-CM | POA: Diagnosis not present

## 2024-03-31 LAB — CBC WITH DIFFERENTIAL/PLATELET
Abs Immature Granulocytes: 0.04 K/uL (ref 0.00–0.07)
Basophils Absolute: 0.1 K/uL (ref 0.0–0.1)
Basophils Relative: 1 %
Eosinophils Absolute: 0.2 K/uL (ref 0.0–0.5)
Eosinophils Relative: 3 %
HCT: 36.9 % (ref 36.0–46.0)
Hemoglobin: 11.9 g/dL — ABNORMAL LOW (ref 12.0–15.0)
Immature Granulocytes: 1 %
Lymphocytes Relative: 12 %
Lymphs Abs: 0.9 K/uL (ref 0.7–4.0)
MCH: 29.2 pg (ref 26.0–34.0)
MCHC: 32.2 g/dL (ref 30.0–36.0)
MCV: 90.7 fL (ref 80.0–100.0)
Monocytes Absolute: 0.7 K/uL (ref 0.1–1.0)
Monocytes Relative: 10 %
Neutro Abs: 5.1 K/uL (ref 1.7–7.7)
Neutrophils Relative %: 73 %
Platelets: 99 K/uL — ABNORMAL LOW (ref 150–400)
RBC: 4.07 MIL/uL (ref 3.87–5.11)
RDW: 14.5 % (ref 11.5–15.5)
WBC: 7 K/uL (ref 4.0–10.5)
nRBC: 0 % (ref 0.0–0.2)

## 2024-03-31 LAB — CMP (CANCER CENTER ONLY)
ALT: 13 U/L (ref 0–44)
AST: 24 U/L (ref 15–41)
Albumin: 4.3 g/dL (ref 3.5–5.0)
Alkaline Phosphatase: 55 U/L (ref 38–126)
Anion gap: 9 (ref 5–15)
BUN: 23 mg/dL (ref 8–23)
CO2: 25 mmol/L (ref 22–32)
Calcium: 10 mg/dL (ref 8.9–10.3)
Chloride: 107 mmol/L (ref 98–111)
Creatinine: 1.35 mg/dL — ABNORMAL HIGH (ref 0.44–1.00)
GFR, Estimated: 40 mL/min — ABNORMAL LOW (ref >60)
Glucose, Bld: 92 mg/dL (ref 70–99)
Potassium: 4.6 mmol/L (ref 3.5–5.1)
Sodium: 141 mmol/L (ref 135–145)
Total Bilirubin: 0.5 mg/dL (ref 0.0–1.2)
Total Protein: 7.2 g/dL (ref 6.5–8.1)

## 2024-03-31 MED ORDER — ELTROMBOPAG OLAMINE 25 MG PO TABS: 25.0000 mg | ORAL_TABLET | Freq: Every day | ORAL | 2 refills | Status: AC

## 2024-03-31 NOTE — Progress Notes (Signed)
 [facility] Cancer Follow up:    Jillian Ezekiel NOVAK, MD [address] [place] [address] [place] [address]   DIAGNOSIS: Thrombocytopenia  SUMMARY OF HEMATOLOGIC HISTORY:  Underwent thrombocytopenia lab evaluation inn November 2021 without no evidence of hepatitis, hypothyroidism, nutritional deficiencies.  MR of the abdomen from May 2021 shows no evidence of liver disease or splenomegaly.  Bone marrow biopsy from 03/19/2020 showed hypercellularity, no evidence of dyspoeisis, megakaryocytic hyperplasia. Flow cytometry negative for any monoclonal B cell population. Cytogenetics were normal.  Bone marrow biopsy 02/13/2023, overall normocellular marrow with orderly trilineage hematopoiesis and megakaryocytic hyperplasia. 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 Cytogenetics showed normal female karyotype.  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  She has chronic immune thrombocytopenia managed with daily Promacta . The most recent platelet count is 99,000. She has not experienced bleeding or ecchymosis, and there are no new symptoms attributable to thrombocytopenia.  She sustained a fall from bed, landing on her bottom, which resulted in persistent neck pain radiating upward from the site of impact. She is uncertain of the exact  mechanism but noted that a chair used for support tipped over with her. Since the fall, she has been sleeping in a chair due to concern for recurrent falls. She denies other acute injuries, neurological deficits, or new health issues aside from the neck pain.  Her husband described a single episode at a family event where she missed her grandson's performance despite being present, raising a question about her focus. Both agree this was an isolated event, and she denies other cognitive or memory concerns.  Rest of the pertinent 10 point ROS reviewed and neg.  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   History of kidney stones    Hypotension due to hypovolemia 11/21/2023   Dehydration 11/21/2023   Atrial fibrillation (HCC) 11/01/2023   High serum creatinine 01/30/2023   Postmenopausal bleeding 01/30/2023   Hypokalemia 01/11/2023   Acute cystitis without hematuria 01/10/2023   History of TIA (transient ischemic attack) 01/10/2023   Atrial fibrillation with RVR (HCC) 01/09/2023   TIA (transient ischemic attack) 10/17/2022   Primary hypertension 04/28/2022   Snoring 03/04/2021   Paroxysmal atrial fibrillation (HCC) 11/01/2020   Secondary hypercoagulable state 11/01/2020   Increased urinary frequency 10/20/2020   Localized osteoporosis without current pathological fracture 10/20/2020   IDA (iron deficiency anemia) 04/07/2020   CME (cystoid macular edema), right 12/21/2016   Pseudophakia 06/01/2016   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  MEDICAL HISTORY: Past Medical History:  Diagnosis Date   Anemia with low platelet count    Atrial fibrillation (HCC) 2022   Chronic kidney disease    Degenerative joint disease    Dysrhythmia    a fib   GERD (gastroesophageal reflux disease)    History of kidney stones    Hyperlipidemia    Hypothyroidism    Inguinal hernia 02/19/2019   Labile hypertension    Migraines    neurontin  helps   Pneumonia    hx of walking pneumonia    Primary hypertension 04/28/2022    SURGICAL HISTORY: Past Surgical History:  Procedure Laterality Date   ATRIAL FIBRILLATION ABLATION N/A 11/01/2023   Procedure: ATRIAL FIBRILLATION ABLATION;  Surgeon: Cindie Ole DASEN, MD;  Location: [facility];  Service: Cardiovascular;  Laterality: N/A;   BILATERAL CATARACT SURGERY      BLERPHOROPLASTY \     CHOLECYSTECTOMY  1996   open   COLONOSCOPY  2009   recommended 10 year f/u   FOOT SURGERY Right    HERNIA REPAIR  1985   umb hernia rpr   HERNIA REPAIR  2005 & 2009   ventral hernia rpr   INGUINAL HERNIA REPAIR Left 04/30/2020   Procedure: OPEN LEFT INGUINAL HERNIA REPAIR WITH MESH;  Surgeon: Signe Mitzie LABOR, MD;  Location: [facility];  Service: General;  Laterality: Left;   INGUINAL HERNIA REPAIR Right 10/24/2021   Procedure: OPEN RIGHT INGUINAL HERNIA REPAIR WITH MESH ;  Surgeon: Signe Mitzie LABOR, MD;  Location: [facility];  Service: General;  Laterality: Right;   LEFT ATRIAL APPENDAGE OCCLUSION N/A 11/01/2023   Procedure: LEFT ATRIAL APPENDAGE OCCLUSION;  Surgeon: Cindie Ole DASEN, MD;  Location: [facility];  Service: Cardiovascular;  Laterality: N/A;   PILONIDAL CYST EXCISION  1972   SKIN CANCER REMOVAL      TONSILLECTOMY     TRANSESOPHAGEAL ECHOCARDIOGRAM (CATH LAB) N/A 11/01/2023   Procedure: TRANSESOPHAGEAL ECHOCARDIOGRAM;  Surgeon: Cindie Ole DASEN, MD;  Location: [facility] INVASIVE CV LAB;  Service:  Cardiovascular;  Laterality: N/A;   UPPER GI ENDOSCOPY     VENTRAL HERNIA REPAIR      SOCIAL HISTORY: Social History   Socioeconomic History   Marital status: Married    Spouse name: Deward   Number of children: 2   Years of education: 18   Highest education level: Not on file  Occupational History   Not on file  Tobacco Use   Smoking status: Never   Smokeless tobacco: Never  Vaping Use   Vaping status: Never Used  Substance and Sexual Activity   Alcohol  use: No   Drug use: No   Sexual activity: Yes  Other Topics Concern   Not on file  Social History Narrative   Live with husband    Retired    Social Drivers of Health   Tobacco Use: Low Risk (02/21/2024)   Patient History    Smoking Tobacco Use: Never    Smokeless Tobacco Use: Never    Passive Exposure: Not on file  Financial Resource Strain: Not on file  Food Insecurity: No Food Insecurity (11/01/2023)   Epic    Worried About Programme Researcher, Broadcasting/film/video in the Last Year: Never true    Ran Out of Food in the Last Year: Never true  Transportation Needs: No Transportation Needs (11/01/2023)  Epic    Lack of Transportation (Medical): No    Lack of Transportation (Non-Medical): No  Physical Activity: Not on file  Stress: Not on file  Social Connections: Unknown (11/01/2023)   Social Connection and Isolation Panel    Frequency of Communication with Friends and Family: Never    Frequency of Social Gatherings with Friends and Family: Never    Attends Religious Services: Never    Database Administrator or Organizations: No    Attends Banker Meetings: Never    Marital Status: Patient declined  Intimate Partner Violence: Not At Risk (11/01/2023)   Epic    Fear of Current or Ex-Partner: No    Emotionally Abused: No    Physically Abused: No    Sexually Abused: No  Depression (PHQ2-9): Low Risk (01/04/2024)   Depression (PHQ2-9)    PHQ-2 Score: 0  Alcohol  Screen: Not on file  Housing: Low Risk (11/01/2023)   Epic     Unable to Pay for Housing in the Last Year: No    Number of Times Moved in the Last Year: 0    Homeless in the Last Year: No  Utilities: Not At Risk (11/01/2023)   Epic    Threatened with loss of utilities: No  Health Literacy: Not on file    FAMILY HISTORY: Family History  Problem Relation Age of Onset   Ovarian cancer Mother    Heart disease Mother    Cancer Mother    Uterine cancer Mother    Hypertension Mother    Breast cancer Sister    Stroke Paternal Uncle    Congestive Heart Failure Father    Gout Maternal Aunt    Colon cancer Neg Hx    Esophageal cancer Neg Hx    Stomach cancer Neg Hx    Rectal cancer Neg Hx         PHYSICAL EXAMINATION  BP 135/78 (BP Location: Right Arm, Patient Position: Sitting)   Pulse 85   Temp (!) 97.1 F (36.2 C) (Temporal)   Resp 16   Wt 156 lb 11.2 oz (71.1 kg)   SpO2 100%   BMI 26.90 kg/m    Alert, oriented no acute distress CTA bilaterally RRR No LE edema  LABORATORY DATA:  CBC    Component Value Date/Time   WBC 6.3 01/04/2024 1247   RBC 3.80 (L) 01/04/2024 1247   HGB 11.3 (L) 01/04/2024 1247   HGB 11.0 (L) 12/12/2023 1512   HCT 35.0 (L) 01/04/2024 1247   HCT 34.4 12/12/2023 1512   PLT 78 (L) 01/04/2024 1247   PLT 88 (LL) 12/12/2023 1512   MCV 92.1 01/04/2024 1247   MCV 95 12/12/2023 1512   MCH 29.7 01/04/2024 1247   MCHC 32.3 01/04/2024 1247   RDW 13.4 01/04/2024 1247   RDW 13.1 12/12/2023 1512   LYMPHSABS 1.0 01/04/2024 1247   LYMPHSABS 1.5 10/31/2023 1435   MONOABS 0.9 01/04/2024 1247   EOSABS 0.3 01/04/2024 1247   EOSABS 0.1 10/31/2023 1435   BASOSABS 0.0 01/04/2024 1247   BASOSABS 0.0 10/31/2023 1435    CMP     Component Value Date/Time   NA 140 01/04/2024 1247   NA 140 12/12/2023 1512   K 3.8 01/04/2024 1247   CL 103 01/04/2024 1247   CO2 30 01/04/2024 1247   GLUCOSE 122 (H) 01/04/2024 1247   BUN 22 01/04/2024 1247   BUN 24 12/12/2023 1512   CREATININE 1.37 (H) 01/04/2024 1247    CREATININE 1.27 (  H) 01/18/2023 1219   CALCIUM  9.9 01/04/2024 1247   PROT 6.8 01/04/2024 1247   ALBUMIN 3.8 01/04/2024 1247   AST 21 01/04/2024 1247   ALT 15 01/04/2024 1247   ALKPHOS 56 01/04/2024 1247   BILITOT 0.6 01/04/2024 1247   GFRNONAA 39 (L) 01/04/2024 1247   GFRNONAA 45 (L) 10/20/2020 1542   GFRAA 52 (L) 10/20/2020 1542    ASSESSMENT and THERAPY PLAN:   Assessment and Plan Assessment & Plan  Chronic immune thrombocytopenia Chronic thrombocytopenia stable on Promacta  with platelet count at 99,000. No bleeding or adverse effects noted. - Refilled Promacta  prescription to mail order pharmacy. - Advised continuation of daily Promacta . - Scheduled follow-up in 3 months with labs.  Fall with neck pain Recent fall resulted in cervical pain. Sleeping in chair due to fear of falls. - Discussed need for bed guardrails to prevent future falls. - Offered to research and provide information on medical-grade bed guardrails. - Advised family to consider purchasing and installing guardrails.  She will RTC in 3 months for follow up.  Time spent: 20 min  *Total Encounter Time as defined by the Centers for Medicare and Medicaid Services includes, in addition to the face-to-face time of a patient visit (documented in the note above) non-face-to-face time: obtaining and reviewing outside history, ordering and reviewing medications, tests or procedures, care coordination (communications with other health care professionals or caregivers) and documentation in the medical record.

## 2024-03-31 NOTE — Progress Notes (Unsigned)
 Cardiology Office Note:    Date:  04/01/2024   ID:  Jillian Rodgers, DOB 04/21/1945, MRN [id number]  PCP:  [name], MD  Cardiologist:  Lonni LITTIE Nanas, MD  Electrophysiologist:  OLE ONEIDA HOLTS, MD   Referring MD: [name], MD   Chief Complaint  Patient presents with   Atrial Fibrillation    History of Present Illness:    Jillian Rodgers is a 78 y.o. female with a hx of paroxysmal atrial fibrillation, chronic thrombocytopenia, TIA, CKD stage III, hypertension, hypothyroidism, hyperlipidemia who presents for follow-up.  She was diagnosed with A-fib 10/2020, underwent successful DCCV at that time.  She was taken off anticoagulation due to chronic soft thrombocytopenia.  Admitted 10/2022 with TIA.  She was admitted 12/2022 with A-fib with RVR.  During admission, her platelets were stable in 60s and in discussion with hematology she tolerated heparin  drip she was transitioned to Eliquis .  She spontaneously converted to normal sinus rhythm during admission and was discharged on Eliquis  and metoprolol .  At follow-up in 01/2023, her platelet count had declined to 30s and Eliquis  was held.  Underwent steroid treatment for thrombocytopenia on 02/2023 and IVIG 03/2023 with improvement in platelet count and restarted on Eliquis .  She was admitted 03/2023 with recurrent A-fib with RVR, was continued on Eliquis  5 mg twice daily, and she spontaneously converted to sinus rhythm during admission.  She was seen by Dr. Holts on 04/17/2023, planning A-fib ablation/Watchman procedure.  She was admitted with recurrent A-fib 04/18/2023.  She underwent A-fib ablation and watchman placement on 11/01/2023.  Since last clinic visit, she reports she is doing okay.  Has recently been having URI symptoms, cough and congestion.  She denies any dyspnea.  Denies any chest pain.  Reports lightheadedness has improved.  She is taking Plavix , reports some nosebleeds.  Has not felt further A-fib.  Wt  Readings from Last 3 Encounters:  04/01/24 155 lb (70.3 kg)  03/31/24 156 lb 11.2 oz (71.1 kg)  02/21/24 158 lb 8 oz (71.9 kg)     Past Medical History:  Diagnosis Date   Anemia with low platelet count    Atrial fibrillation (HCC) 2022   Chronic kidney disease    Degenerative joint disease    Dysrhythmia    a fib   GERD (gastroesophageal reflux disease)    History of kidney stones    Hyperlipidemia    Hypothyroidism    Inguinal hernia 02/19/2019   Labile hypertension    Migraines    neurontin  helps   Pneumonia    hx of walking pneumonia    Primary hypertension 04/28/2022    Past Surgical History:  Procedure Laterality Date   ATRIAL FIBRILLATION ABLATION N/A 11/01/2023   Procedure: ATRIAL FIBRILLATION ABLATION;  Surgeon: Holts Ole ONEIDA, MD;  Location: [facility];  Service: Cardiovascular;  Laterality: N/A;   BILATERAL CATARACT SURGERY      BLERPHOROPLASTY \     CHOLECYSTECTOMY  1996   open   COLONOSCOPY  2009   recommended 10 year f/u   FOOT SURGERY Right    HERNIA REPAIR  1985   umb hernia rpr   HERNIA REPAIR  2005 & 2009   ventral hernia rpr   INGUINAL HERNIA REPAIR Left 04/30/2020   Procedure: OPEN LEFT INGUINAL HERNIA REPAIR WITH MESH;  Surgeon: Signe Mitzie DELENA, MD;  Location: [facility];  Service: General;  Laterality: Left;   INGUINAL HERNIA REPAIR Right 10/24/2021   Procedure: OPEN RIGHT  INGUINAL HERNIA REPAIR WITH MESH ;  Surgeon: Signe Mitzie LABOR, MD;  Location: [facility];  Service: General;  Laterality: Right;   LEFT ATRIAL APPENDAGE OCCLUSION N/A 11/01/2023   Procedure: LEFT ATRIAL APPENDAGE OCCLUSION;  Surgeon: Cindie Ole DASEN, MD;  Location: [facility];  Service: Cardiovascular;  Laterality: N/A;   PILONIDAL CYST EXCISION  1972   SKIN CANCER REMOVAL      TONSILLECTOMY     TRANSESOPHAGEAL ECHOCARDIOGRAM (CATH LAB) N/A 11/01/2023   Procedure: TRANSESOPHAGEAL ECHOCARDIOGRAM;  Surgeon: Cindie Ole DASEN, MD;  Location: [facility];   Service: Cardiovascular;  Laterality: N/A;   UPPER GI ENDOSCOPY     VENTRAL HERNIA REPAIR      Current Medications: Current Meds  Medication Sig   Ascorbic Acid (VITAMIN C) 500 MG CAPS Take 500 mg by mouth daily.    atorvastatin  (LIPITOR ) 80 MG tablet Take 1 tablet (80 mg total) by mouth daily.   BOTOX 100 units SOLR injection Inject 100 Units into the muscle See admin instructions. Inject 100 units intramuscularly every 6-8 weeks   calcium  carbonate (SUPER CALCIUM ) 1500 (600 Ca) MG TABS tablet Take 600 mg of elemental calcium  by mouth at bedtime.   cetirizine (ZYRTEC) 10 MG tablet Take 10 mg by mouth daily.   Cholecalciferol (VITAMIN D3) 125 MCG (5000 UT) TABS Take 5,000 Units by mouth daily.   clopidogrel  (PLAVIX ) 75 MG tablet Take 1 tablet (75 mg total) by mouth daily. Start taking on 12/17/23   denosumab (PROLIA) 60 MG/ML SOSY injection Inject 60 mg into the skin every 6 (six) months.   diltiazem  (CARDIZEM  CD) 180 MG 24 hr capsule Take 1 capsule (180 mg total) by mouth daily.   divalproex  (DEPAKOTE  ER) 500 MG 24 hr tablet Take 1 tablet (500 mg total) by mouth at bedtime.   eltrombopag  (PROMACTA ) 25 MG tablet Take 1 tablet (25 mg total) by mouth daily. Take on an empty stomach, 1 hour before a meal or 2 hours after.   gabapentin  (NEURONTIN ) 300 MG capsule Take 300 mg (1 tablet) in the morning and 600 mg (2 tablets) at bedtime   levothyroxine  (SYNTHROID ) 50 MCG tablet Take  1 tablet  Daily  on an empty stomach with only water for 30 minutes & no Antacid meds, Calcium  or Magnesium  for 4 hours & avoid Biotin                                                                                                                    /                                                                   TAKE  BY                                                 MOUTH (Patient taking differently: Take 50 mcg by mouth See admin instructions. Take 50 mcg by mouth in the morning on  an empty stomach, with only water. Take no antacids, calcium , or magnesium  for 4 hours and avoid biotin.)   Magnesium  Oxide 400 MG CAPS Take 1 capsule (400 mg total) by mouth at bedtime.   pantoprazole  (PROTONIX ) 40 MG tablet Take  1 tablet  2 x /day  for Acid Indigestion & Heartburn   SYSTANE BALANCE 0.6 % SOLN Place 1 drop into both eyes at bedtime as needed (dry eyes).   SYSTANE ULTRA 0.4-0.3 % SOLN Place 1 drop into both eyes daily as needed (dry eyes).   TYLENOL  500 MG tablet Take 500-1,000 mg by mouth every 6 (six) hours as needed for mild pain or headache.   Zinc 30 MG TABS Take 30 mg by mouth daily.     Allergies:   Patient has no known allergies.   Social History   Socioeconomic History   Marital status: Married    Spouse name: Deward   Number of children: 2   Years of education: 18   Highest education level: Not on file  Occupational History   Not on file  Tobacco Use   Smoking status: Never   Smokeless tobacco: Never  Vaping Use   Vaping status: Never Used  Substance and Sexual Activity   Alcohol  use: No   Drug use: No   Sexual activity: Yes  Other Topics Concern   Not on file  Social History Narrative   Live with husband    Retired    Social Drivers of Health   Tobacco Use: Low Risk (04/01/2024)   Patient History    Smoking Tobacco Use: Never    Smokeless Tobacco Use: Never    Passive Exposure: Not on file  Financial Resource Strain: Not on file  Food Insecurity: No Food Insecurity (11/01/2023)   Epic    Worried About Programme Researcher, Broadcasting/film/video in the Last Year: Never true    Ran Out of Food in the Last Year: Never true  Transportation Needs: No Transportation Needs (11/01/2023)   Epic    Lack of Transportation (Medical): No    Lack of Transportation (Non-Medical): No  Physical Activity: Not on file  Stress: Not on file  Social Connections: Unknown (11/01/2023)   Social Connection and Isolation Panel    Frequency of Communication with Friends and Family: Never     Frequency of Social Gatherings with Friends and Family: Never    Attends Religious Services: Never    Database Administrator or Organizations: No    Attends Banker Meetings: Never    Marital Status: Patient declined  Depression (PHQ2-9): Low Risk (01/04/2024)   Depression (PHQ2-9)    PHQ-2 Score: 0  Alcohol  Screen: Not on file  Housing: Low Risk (11/01/2023)   Epic    Unable to Pay for Housing in the Last Year: No    Number of Times Moved in the Last Year: 0    Homeless in the Last Year: No  Utilities: Not At Risk (11/01/2023)   Epic    Threatened with loss of utilities: No  Health Literacy: Not on file  Family History: The patient's family history includes Breast cancer in her sister; Cancer in her mother; Congestive Heart Failure in her father; Gout in her maternal aunt; Heart disease in her mother; Hypertension in her mother; Ovarian cancer in her mother; Stroke in her paternal uncle; Uterine cancer in her mother. There is no history of Colon cancer, Esophageal cancer, Stomach cancer, or Rectal cancer.  ROS:   Please see the history of present illness.     All other systems reviewed and are negative.  EKGs/Labs/Other Studies Reviewed:    The following studies were reviewed today:   EKG:   04/01/24: NSR, rate 84, nonspecific T wave flattening 01/25/2024: Sinus bradycardia, rate 58, nonspecific T wave flattening 11/20/2023: Sinus bradycardia, rate 59, nonspecific T wave flattening 04/23/2023: Sinus bradycardia, rate 55, no ST abnormalities 07/25/23: Normal sinus rhythm, rate 69, no ST abnormalities  Recent Labs: 04/18/2023: B Natriuretic Peptide 721.8 03/31/2024: ALT 13; BUN 23; Creatinine 1.35; Hemoglobin 11.9; Platelets 99; Potassium 4.6; Sodium 141  Recent Lipid Panel    Component Value Date/Time   CHOL 143 01/03/2023 1533   TRIG 94 01/03/2023 1533   HDL 72 01/03/2023 1533   CHOLHDL 2.0 01/03/2023 1533   VLDL 34 (H) 09/26/2016 1147   LDLCALC 53  01/03/2023 1533    Physical Exam:    VS:  BP 126/74 (BP Location: Right Arm, Patient Position: Sitting, Cuff Size: Normal)   Pulse 92   Ht 5' 4 (1.626 m)   Wt 155 lb (70.3 kg)   SpO2 96%   BMI 26.61 kg/m     Wt Readings from Last 3 Encounters:  04/01/24 155 lb (70.3 kg)  03/31/24 156 lb 11.2 oz (71.1 kg)  02/21/24 158 lb 8 oz (71.9 kg)     GEN:  Well nourished, well developed in no acute distress HEENT: Normal NECK: No JVD; No carotid bruits LYMPHATICS: No lymphadenopathy CARDIAC: RRR, no murmurs, rubs, gallops RESPIRATORY:  Clear to auscultation without rales, wheezing or rhonchi  ABDOMEN: Soft, non-tender, non-distended MUSCULOSKELETAL:  trace edema SKIN: Warm and dry NEUROLOGIC:  Alert and oriented x 3 PSYCHIATRIC:  Normal affect   ASSESSMENT:    1. Paroxysmal atrial fibrillation (HCC)   2. Lightheadedness   3. Thrombocytopenia     PLAN:    Lightheadedness/hypotension/AKI: Sent to ED from clinic 11/20/2023, SBP down to 80s.  Suspect due to dehydration from taking Lasix  daily since her ablation.  Labs notable for AKI with creatinine up to 1.71.  Lasix  changed to as needed.  Creatinine improved to baseline.  BP significantly improved.   - Weaned off metoprolol  given persistent orthostasis.  Reports significant improvement in symptoms since stopping metoprolol .  Paroxysmal atrial fibrillation: Complicated situation, has had recurrent admissions for A-fib with RVR but has been unable to tolerate anticoagulation despite high CHA2DS2-VASc score (6, hypertension, age x 2, TIA, female) due to chronic thrombocytopenia.  Echocardiogram 12/2022 showed EF 60 to 65%, normal RV function, severe left atrial enlargement, no significant valvular disease.  She underwent A-fib ablation and watchman placement on 11/01/2023. -She completed 45 days of Eliquis  after her Watchman, now on Plavix  75 mg daily; plan to complete 6 months of Plavix  through 04/2024 -Continue Cardizem  180 mg daily.   Discontinued metoprolol  due to orthostasis as above  Chronic thrombocytopenia: Follows with hematology, likely ITP.  Treated with steroids 02/2023 and IVIG 03/2023, with improvement in counts.  Platelets were again down to 39 on 06/18/2023.  Underwent additional IVIG treatment.  Most recent platelets 99  on 03/31/2024  Lower extremity edema: She has been taking as needed Lasix , but this is contributing to her orthostasis, recommended discontinuing Lasix  and using compression stockings as needed  Hypertension: Continue diltiazem , appears controlled  TIA: Continue atorvastatin , Plavix   OSA: Has known OSA but has not been on CPAP.  Ordered sleep study but was not done  RTC in 4 months   Medication Adjustments/Labs and Tests Ordered: Current medicines are reviewed at length with the patient today.  Concerns regarding medicines are outlined above.  Orders Placed This Encounter  Procedures   EKG 12-Lead   No orders of the defined types were placed in this encounter.   Patient Instructions  Medication Instructions:  Your physician recommends that you continue on your current medications as directed. Please refer to the Current Medication list given to you today.  *If you need a refill on your cardiac medications before your next appointment, please call your pharmacy*  Lab Work: NONE If you have labs (blood work) drawn today and your tests are completely normal, you will receive your results only by: MyChart Message (if you have MyChart) OR A paper copy in the mail If you have any lab test that is abnormal or we need to change your treatment, we will call you to review the results.  Testing/Procedures: NONE  Follow-Up: At [facility], you and your health needs are our priority.  As part of our continuing mission to provide you with exceptional heart care, our providers are all part of one team.  This team includes your primary Cardiologist (physician) and Advanced Practice  Providers or APPs (Physician Assistants and Nurse Practitioners) who all work together to provide you with the care you need, when you need it.  Your next appointment:   4 month(s)  Provider:   Lonni LITTIE Nanas, MD    We recommend signing up for the patient portal called MyChart.  Sign up information is provided on this After Visit Summary.  MyChart is used to connect with patients for Virtual Visits (Telemedicine).  Patients are able to view lab/test results, encounter notes, upcoming appointments, etc.  Non-urgent messages can be sent to your provider as well.   To learn more about what you can do with MyChart, go to [email or web address].          Signed, Lonni LITTIE Nanas, MD  04/01/2024 9:48 AM    [facility]

## 2024-04-01 ENCOUNTER — Ambulatory Visit: Admitting: Cardiology

## 2024-04-01 ENCOUNTER — Other Ambulatory Visit: Payer: Self-pay

## 2024-04-01 ENCOUNTER — Encounter: Payer: Self-pay | Admitting: Cardiology

## 2024-04-01 VITALS — BP 126/74 | HR 92 | Ht 64.0 in | Wt 155.0 lb

## 2024-04-01 DIAGNOSIS — R42 Dizziness and giddiness: Secondary | ICD-10-CM

## 2024-04-01 DIAGNOSIS — I480 Paroxysmal atrial fibrillation: Secondary | ICD-10-CM

## 2024-04-01 DIAGNOSIS — D696 Thrombocytopenia, unspecified: Secondary | ICD-10-CM

## 2024-04-01 NOTE — Patient Instructions (Signed)
 Medication Instructions:  Your physician recommends that you continue on your current medications as directed. Please refer to the Current Medication list given to you today.  *If you need a refill on your cardiac medications before your next appointment, please call your pharmacy*  Lab Work: NONE If you have labs (blood work) drawn today and your tests are completely normal, you will receive your results only by: MyChart Message (if you have MyChart) OR A paper copy in the mail If you have any lab test that is abnormal or we need to change your treatment, we will call you to review the results.  Testing/Procedures: NONE  Follow-Up: At [facility], you and your health needs are our priority.  As part of our continuing mission to provide you with exceptional heart care, our providers are all part of one team.  This team includes your primary Cardiologist (physician) and Advanced Practice Providers or APPs (Physician Assistants and Nurse Practitioners) who all work together to provide you with the care you need, when you need it.  Your next appointment:   4 month(s)  Provider:   Lonni LITTIE Nanas, MD    We recommend signing up for the patient portal called MyChart.  Sign up information is provided on this After Visit Summary.  MyChart is used to connect with patients for Virtual Visits (Telemedicine).  Patients are able to view lab/test results, encounter notes, upcoming appointments, etc.  Non-urgent messages can be sent to your provider as well.   To learn more about what you can do with MyChart, go to [email or web address].

## 2024-04-01 NOTE — Progress Notes (Signed)
 Patient has transferred care to [facility], ok per [facility] to disenroll patient.

## 2024-04-04 ENCOUNTER — Inpatient Hospital Stay: Admitting: Hematology and Oncology

## 2024-04-04 ENCOUNTER — Inpatient Hospital Stay

## 2024-04-08 ENCOUNTER — Other Ambulatory Visit: Payer: Self-pay

## 2024-04-08 ENCOUNTER — Other Ambulatory Visit (HOSPITAL_COMMUNITY): Payer: Self-pay

## 2024-04-09 ENCOUNTER — Other Ambulatory Visit (HOSPITAL_COMMUNITY): Payer: Self-pay

## 2024-04-09 ENCOUNTER — Other Ambulatory Visit: Payer: Self-pay

## 2024-04-22 ENCOUNTER — Other Ambulatory Visit: Payer: Self-pay

## 2024-04-30 ENCOUNTER — Ambulatory Visit: Admitting: Podiatry

## 2024-05-05 ENCOUNTER — Telehealth: Payer: Self-pay | Admitting: Cardiology

## 2024-05-05 NOTE — Telephone Encounter (Signed)
 Okay to stop Plavix .  Would switch to aspirin  81 mg daily

## 2024-05-05 NOTE — Telephone Encounter (Signed)
 Pt c/o medication issue:  1. Name of Medication: clopidogrel  (PLAVIX ) 75 MG tablet   2. How are you currently taking this medication (dosage and times per day)? As  written  3. Are you having a reaction (difficulty breathing--STAT)? No  4. What is your medication issue? Pt would like to get dental work done but was told to hold this medication however the pt would like to stop the medication indefinitely

## 2024-05-06 NOTE — Telephone Encounter (Signed)
 Yes recommend stopping Plavix  and starting aspirin  81 mg daily moving forward  Yes would give amoxicillin  for her dental cleaning

## 2024-05-06 NOTE — Telephone Encounter (Signed)
 Spoke with patient with update from Dr Kate: Genna to stop Plavix . Would switch to aspirin  81 mg daily  Pt asking if she needs to take the Amoxicillin  before her dental cleaning 05/08/24. Will send to Dr Kate for clarification on this.   Pt verbalizes understanding of medication regimen change. Will update once Dr Kate reviews.

## 2024-05-07 MED ORDER — ASPIRIN 81 MG PO TBEC: 81.0000 mg | DELAYED_RELEASE_TABLET | Freq: Every day | ORAL | Status: AC

## 2024-05-07 NOTE — Addendum Note (Signed)
 Addended by: VICCI ROXIE CROME on: 05/07/2024 10:34 AM   Modules accepted: Orders

## 2024-05-07 NOTE — Telephone Encounter (Signed)
 Shared message from Dr. Kate with patient: Yes recommend stopping Plavix  and starting aspirin  81 mg daily moving forward   Yes would give amoxicillin  for her dental cleaning      Plavix  discontinued and removed from medication list. ASA 81 mg daily added to medication list. Patient verbalized understanding of the above and states she already has Rx for amoxicillin . No further needs voiced at this time.

## 2024-05-14 ENCOUNTER — Ambulatory Visit: Admitting: Podiatry

## 2024-05-20 ENCOUNTER — Other Ambulatory Visit: Payer: Self-pay | Admitting: Cardiology

## 2024-06-30 ENCOUNTER — Ambulatory Visit: Admitting: Podiatry

## 2024-07-01 ENCOUNTER — Inpatient Hospital Stay: Admitting: Hematology and Oncology

## 2024-07-01 ENCOUNTER — Inpatient Hospital Stay

## 2025-02-26 ENCOUNTER — Ambulatory Visit: Admitting: Neurology
